# Patient Record
Sex: Male | Born: 1950
Health system: Southern US, Community
[De-identification: ages and names within clinical notes are randomized; demographics above are authoritative.]

## PROBLEM LIST (undated history)

## (undated) DIAGNOSIS — R471 Dysarthria and anarthria: Secondary | ICD-10-CM

## (undated) DIAGNOSIS — R1310 Dysphagia, unspecified: Secondary | ICD-10-CM

## (undated) DIAGNOSIS — E785 Hyperlipidemia, unspecified: Secondary | ICD-10-CM

## (undated) DIAGNOSIS — E119 Type 2 diabetes mellitus without complications: Secondary | ICD-10-CM

## (undated) DIAGNOSIS — K922 Gastrointestinal hemorrhage, unspecified: Secondary | ICD-10-CM

## (undated) DIAGNOSIS — N289 Disorder of kidney and ureter, unspecified: Secondary | ICD-10-CM

## (undated) DIAGNOSIS — I10 Essential (primary) hypertension: Secondary | ICD-10-CM

## (undated) DIAGNOSIS — I639 Cerebral infarction, unspecified: Secondary | ICD-10-CM

## (undated) HISTORY — DX: Dysarthria and anarthria: R47.1

## (undated) HISTORY — DX: Dysphagia, unspecified: R13.10

## (undated) HISTORY — DX: Gastrointestinal hemorrhage, unspecified: K92.2

## (undated) HISTORY — DX: Disorder of kidney and ureter, unspecified: N28.9

---

## 2001-09-20 ENCOUNTER — Emergency Department (HOSPITAL_COMMUNITY): Admission: EM | Admit: 2001-09-20 | Discharge: 2001-09-20 | Payer: Self-pay | Admitting: Emergency Medicine

## 2001-09-20 ENCOUNTER — Encounter: Payer: Self-pay | Admitting: Emergency Medicine

## 2008-08-02 ENCOUNTER — Emergency Department (HOSPITAL_COMMUNITY): Admission: EM | Admit: 2008-08-02 | Discharge: 2008-08-02 | Payer: Self-pay | Admitting: *Deleted

## 2008-09-21 ENCOUNTER — Emergency Department (HOSPITAL_COMMUNITY): Admission: EM | Admit: 2008-09-21 | Discharge: 2008-09-21 | Payer: Self-pay | Admitting: Emergency Medicine

## 2008-11-06 ENCOUNTER — Emergency Department (HOSPITAL_COMMUNITY): Admission: EM | Admit: 2008-11-06 | Discharge: 2008-11-06 | Payer: Self-pay | Admitting: Emergency Medicine

## 2010-08-16 LAB — CBC
HCT: 46.7 % (ref 39.0–52.0)
Hemoglobin: 15.5 g/dL (ref 13.0–17.0)
MCHC: 33.2 g/dL (ref 30.0–36.0)
MCV: 84.2 fL (ref 78.0–100.0)
Platelets: 224 10*3/uL (ref 150–400)
RBC: 5.55 MIL/uL (ref 4.22–5.81)
RDW: 14.1 % (ref 11.5–15.5)
WBC: 10.2 10*3/uL (ref 4.0–10.5)

## 2010-08-16 LAB — BASIC METABOLIC PANEL
BUN: 11 mg/dL (ref 6–23)
CO2: 26 mEq/L (ref 19–32)
Calcium: 9.3 mg/dL (ref 8.4–10.5)
Chloride: 106 mEq/L (ref 96–112)
Creatinine, Ser: 1.1 mg/dL (ref 0.4–1.5)
GFR calc Af Amer: >60 mL/min (ref >60)
GFR calc non Af Amer: >60 mL/min (ref >60)
Glucose, Bld: 113 mg/dL — ABNORMAL HIGH (ref 70–99)
Potassium: 3.9 mEq/L (ref 3.5–5.1)
Sodium: 140 mEq/L (ref 135–145)

## 2010-08-16 LAB — DIFFERENTIAL
Basophils Absolute: 0 10*3/uL (ref 0.0–0.1)
Basophils Relative: 0 % (ref 0–1)
Eosinophils Absolute: 0.2 10*3/uL (ref 0.0–0.7)
Eosinophils Relative: 2 % (ref 0–5)
Lymphocytes Relative: 19 % (ref 12–46)
Lymphs Abs: 2 10*3/uL (ref 0.7–4.0)
Monocytes Absolute: 0.9 10*3/uL (ref 0.1–1.0)
Monocytes Relative: 9 % (ref 3–12)
Neutro Abs: 7.2 10*3/uL (ref 1.7–7.7)
Neutrophils Relative %: 70 % (ref 43–77)

## 2010-08-16 LAB — URINALYSIS, ROUTINE W REFLEX MICROSCOPIC
Glucose, UA: NEGATIVE mg/dL
Hgb urine dipstick: NEGATIVE
Protein, ur: >300 mg/dL — AB
Urobilinogen, UA: 1 mg/dL (ref 0.0–1.0)

## 2010-08-16 LAB — SYNOVIAL CELL COUNT + DIFF, W/ CRYSTALS
Monocyte-Macrophage-Synovial Fluid: 7 % — ABNORMAL LOW (ref 50–90)
WBC, Synovial: 33240 /mm3 — ABNORMAL HIGH (ref 0–200)

## 2010-08-16 LAB — SEDIMENTATION RATE: Sed Rate: 17 mm/hr — ABNORMAL HIGH (ref 0–16)

## 2010-08-16 LAB — URINE MICROSCOPIC-ADD ON

## 2010-08-16 LAB — GRAM STAIN

## 2010-08-16 LAB — BODY FLUID CULTURE: Culture: NO GROWTH

## 2010-08-18 LAB — ACETAMINOPHEN LEVEL: Acetaminophen (Tylenol), Serum: <10 ug/mL — ABNORMAL LOW (ref 10–30)

## 2010-08-18 LAB — DIFFERENTIAL
Basophils Relative: 1 % (ref 0–1)
Lymphocytes Relative: 17 % (ref 12–46)
Lymphs Abs: 1.9 10*3/uL (ref 0.7–4.0)
Monocytes Absolute: 1.3 10*3/uL — ABNORMAL HIGH (ref 0.1–1.0)
Monocytes Relative: 12 % (ref 3–12)
Neutro Abs: 7.3 10*3/uL (ref 1.7–7.7)
Neutrophils Relative %: 68 % (ref 43–77)

## 2010-08-18 LAB — CBC
Hemoglobin: 15.7 g/dL (ref 13.0–17.0)
MCHC: 33.4 g/dL (ref 30.0–36.0)
RBC: 5.62 MIL/uL (ref 4.22–5.81)
WBC: 10.8 10*3/uL — ABNORMAL HIGH (ref 4.0–10.5)

## 2010-08-18 LAB — RAPID URINE DRUG SCREEN, HOSP PERFORMED
Barbiturates: NOT DETECTED
Cocaine: NOT DETECTED

## 2010-08-18 LAB — BODY FLUID CULTURE: Culture: NO GROWTH

## 2010-08-18 LAB — SALICYLATE LEVEL: Salicylate Lvl: <4 mg/dL (ref 2.8–20.0)

## 2010-08-18 LAB — GRAM STAIN

## 2010-08-18 LAB — BASIC METABOLIC PANEL
BUN: 10 mg/dL (ref 6–23)
CO2: 25 mEq/L (ref 19–32)
Glucose, Bld: 118 mg/dL — ABNORMAL HIGH (ref 70–99)
Potassium: 3.9 mEq/L (ref 3.5–5.1)
Sodium: 135 mEq/L (ref 135–145)

## 2010-08-18 LAB — PROTEIN, BODY FLUID

## 2010-08-18 LAB — SYNOVIAL CELL COUNT + DIFF, W/ CRYSTALS
Lymphocytes-Synovial Fld: 0 % (ref 0–20)
WBC, Synovial: 24900 /mm3 — ABNORMAL HIGH (ref 0–200)

## 2010-08-18 LAB — URIC ACID: Uric Acid, Serum: 10.9 mg/dL — ABNORMAL HIGH (ref 4.0–7.8)

## 2010-08-18 LAB — GLUCOSE, SEROUS FLUID: Glucose, Fluid: 92 mg/dL

## 2010-08-18 LAB — ETHANOL: Alcohol, Ethyl (B): <5 mg/dL (ref 0–10)

## 2011-11-16 ENCOUNTER — Inpatient Hospital Stay (HOSPITAL_COMMUNITY): Payer: Non-veteran care

## 2011-11-16 ENCOUNTER — Encounter (HOSPITAL_COMMUNITY): Payer: Self-pay | Admitting: Emergency Medicine

## 2011-11-16 ENCOUNTER — Emergency Department (HOSPITAL_COMMUNITY): Payer: Non-veteran care

## 2011-11-16 ENCOUNTER — Inpatient Hospital Stay (HOSPITAL_COMMUNITY)
Admission: EM | Admit: 2011-11-16 | Discharge: 2011-11-18 | DRG: 066 | Disposition: A | Discharge status: Home / Self Care | Payer: Non-veteran care | Source: Ambulatory Visit | Attending: Internal Medicine | Admitting: Internal Medicine

## 2011-11-16 DIAGNOSIS — I10 Essential (primary) hypertension: Secondary | ICD-10-CM | POA: Diagnosis present

## 2011-11-16 DIAGNOSIS — E785 Hyperlipidemia, unspecified: Secondary | ICD-10-CM | POA: Diagnosis present

## 2011-11-16 DIAGNOSIS — F17200 Nicotine dependence, unspecified, uncomplicated: Secondary | ICD-10-CM | POA: Diagnosis present

## 2011-11-16 DIAGNOSIS — Z720 Tobacco use: Secondary | ICD-10-CM

## 2011-11-16 DIAGNOSIS — E119 Type 2 diabetes mellitus without complications: Secondary | ICD-10-CM | POA: Diagnosis present

## 2011-11-16 DIAGNOSIS — I2510 Atherosclerotic heart disease of native coronary artery without angina pectoris: Secondary | ICD-10-CM

## 2011-11-16 DIAGNOSIS — I639 Cerebral infarction, unspecified: Secondary | ICD-10-CM

## 2011-11-16 DIAGNOSIS — R4789 Other speech disturbances: Secondary | ICD-10-CM | POA: Diagnosis present

## 2011-11-16 DIAGNOSIS — Z823 Family history of stroke: Secondary | ICD-10-CM

## 2011-11-16 DIAGNOSIS — M109 Gout, unspecified: Secondary | ICD-10-CM

## 2011-11-16 DIAGNOSIS — R4701 Aphasia: Secondary | ICD-10-CM | POA: Diagnosis present

## 2011-11-16 DIAGNOSIS — E1159 Type 2 diabetes mellitus with other circulatory complications: Secondary | ICD-10-CM

## 2011-11-16 DIAGNOSIS — I69328 Other speech and language deficits following cerebral infarction: Secondary | ICD-10-CM

## 2011-11-16 DIAGNOSIS — I6350 Cerebral infarction due to unspecified occlusion or stenosis of unspecified cerebral artery: Principal | ICD-10-CM

## 2011-11-16 DIAGNOSIS — Z8673 Personal history of transient ischemic attack (TIA), and cerebral infarction without residual deficits: Secondary | ICD-10-CM

## 2011-11-16 DIAGNOSIS — R7303 Prediabetes: Secondary | ICD-10-CM

## 2011-11-16 HISTORY — DX: Cerebral infarction, unspecified: I63.9

## 2011-11-16 HISTORY — DX: Hyperlipidemia, unspecified: E78.5

## 2011-11-16 HISTORY — DX: Essential (primary) hypertension: I10

## 2011-11-16 LAB — DIFFERENTIAL
Lymphs Abs: 3.3 10*3/uL (ref 0.7–4.0)
Monocytes Relative: 10 % (ref 3–12)
Neutro Abs: 3 10*3/uL (ref 1.7–7.7)
Neutrophils Relative %: 40 % — ABNORMAL LOW (ref 43–77)

## 2011-11-16 LAB — GLUCOSE, CAPILLARY
Glucose-Capillary: 95 mg/dL (ref 70–99)
Glucose-Capillary: 156 mg/dL — ABNORMAL HIGH (ref 70–99)

## 2011-11-16 LAB — CBC
Hemoglobin: 14.9 g/dL (ref 13.0–17.0)
MCH: 28.3 pg (ref 26.0–34.0)
RBC: 5.26 MIL/uL (ref 4.22–5.81)

## 2011-11-16 LAB — COMPREHENSIVE METABOLIC PANEL
Alkaline Phosphatase: 70 U/L (ref 39–117)
BUN: 12 mg/dL (ref 6–23)
Chloride: 105 mEq/L (ref 96–112)
GFR calc Af Amer: 82 mL/min — ABNORMAL LOW (ref >90)
Glucose, Bld: 96 mg/dL (ref 70–99)
Potassium: 3.6 mEq/L (ref 3.5–5.1)
Total Bilirubin: 0.3 mg/dL (ref 0.3–1.2)

## 2011-11-16 LAB — RAPID URINE DRUG SCREEN, HOSP PERFORMED
Amphetamines: NOT DETECTED
Opiates: NOT DETECTED
Tetrahydrocannabinol: NOT DETECTED

## 2011-11-16 LAB — APTT: aPTT: 28 seconds (ref 24–37)

## 2011-11-16 LAB — PROTIME-INR: INR: 1.04 (ref 0.00–1.49)

## 2011-11-16 LAB — ETHANOL: Alcohol, Ethyl (B): <11 mg/dL (ref 0–11)

## 2011-11-16 LAB — CK TOTAL AND CKMB (NOT AT ARMC): Total CK: 202 U/L (ref 7–232)

## 2011-11-16 MED ORDER — ENOXAPARIN SODIUM 40 MG/0.4ML ~~LOC~~ SOLN
40.0000 mg | SUBCUTANEOUS | Status: DC
  Administered 2011-11-16 – 2011-11-17 (×2): 40 mg via SUBCUTANEOUS
  Filled 2011-11-16 (×3): qty 0.4

## 2011-11-16 MED ORDER — SODIUM CHLORIDE 0.9 % IV SOLN: 250.0000 mL | INTRAVENOUS | Status: DC | PRN

## 2011-11-16 MED ORDER — ASPIRIN 325 MG PO TABS
325.0000 mg | ORAL_TABLET | Freq: Every day | ORAL | Status: DC
  Administered 2011-11-17 – 2011-11-18 (×2): 325 mg via ORAL
  Filled 2011-11-16 (×2): qty 1

## 2011-11-16 MED ORDER — ASPIRIN 81 MG PO CHEW
324.0000 mg | CHEWABLE_TABLET | Freq: Once | ORAL | Status: AC
  Administered 2011-11-16: 324 mg via ORAL
  Filled 2011-11-16: qty 4

## 2011-11-16 MED ORDER — ALLOPURINOL 300 MG PO TABS
300.0000 mg | ORAL_TABLET | Freq: Every day | ORAL | Status: DC
  Administered 2011-11-17 – 2011-11-18 (×2): 300 mg via ORAL
  Filled 2011-11-16 (×2): qty 1

## 2011-11-16 MED ORDER — SODIUM CHLORIDE 0.9 % IJ SOLN: 3.0000 mL | INTRAMUSCULAR | Status: DC | PRN

## 2011-11-16 MED ORDER — ASPIRIN 300 MG RE SUPP
300.0000 mg | Freq: Every day | RECTAL | Status: DC
  Filled 2011-11-16 (×2): qty 1

## 2011-11-16 MED ORDER — SIMVASTATIN 20 MG PO TABS
20.0000 mg | ORAL_TABLET | Freq: Every day | ORAL | Status: DC
  Administered 2011-11-17 – 2011-11-18 (×2): 20 mg via ORAL
  Filled 2011-11-16 (×2): qty 1

## 2011-11-16 MED ORDER — SODIUM CHLORIDE 0.9 % IJ SOLN
3.0000 mL | Freq: Two times a day (BID) | INTRAMUSCULAR | Status: DC
  Administered 2011-11-16 – 2011-11-18 (×4): 3 mL via INTRAVENOUS

## 2011-11-16 MED ORDER — SENNOSIDES-DOCUSATE SODIUM 8.6-50 MG PO TABS: 1.0000 | ORAL_TABLET | Freq: Every evening | ORAL | Status: DC | PRN

## 2011-11-16 NOTE — ED Notes (Signed)
Pt. Lives at [facility] in [place].

## 2011-11-16 NOTE — ED Provider Notes (Signed)
History     CSN: [place]:[id number]  Arrival date & time 11/16/11  2   First MD Initiated Contact with Patient 11/16/11 1618      No chief complaint on file.   (Consider location/radiation/quality/duration/timing/severity/associated sxs/prior treatment) The history is provided by the patient and the EMS personnel.   61 year old male noted that he was having difficulty speaking at about 9 AM today. He was last able to speak normally yesterday. He did not have any weakness and denies any headache or chest pain. Symptoms are moderate and getting worse. He denies chest pain, heaviness, tightness, pressure. Denies nausea, vomiting, fever, chills, sweats. He had a previous stroke with similar symptoms.  Past Medical History  Diagnosis Date  . Hypertension   . Hyperlipidemia   . Stroke     History reviewed. No pertinent past surgical history.  No family history on file.  History  Substance Use Topics  . Smoking status: Current Everyday Smoker -- 0.5 packs/day    Types: Cigarettes  . Smokeless tobacco: Not on file  . Alcohol Use: No      Review of Systems  All other systems reviewed and are negative.    Allergies  Review of patient's allergies indicates no known allergies.  Home Medications  No current outpatient prescriptions on file.  There were no vitals taken for this visit.  Physical Exam  Nursing note and vitals reviewed.  61 year old male who is resting comfortably and in no acute distress. Vital signs are significant for mild hypertension with blood pressure 164/94, mild bradycardia with heart rate of 56. Oxygen saturation is 98% which is normal. Head is normocephalic and atraumatic. PERRLA, EOMI. Fundi show no hemorrhage, exudate, or papilledema. There is mild right facial droop present. Neck is nontender and supple without adenopathy, JVD, or bruit. Back is nontender. Lungs are clear without rales, wheezes, rhonchi. Heart has regular rate rhythm without murmur.  Abdomen is soft, flat, nontender without masses or hepatosplenomegaly. Extremities have no cyanosis or edema, full range of motion is present. Skin is warm and dry without rash. Neurologic: Speech is fluent but mildly dysarthric. Cranial nerves are significant for mild right central facial droop. He has slightly weaker grip strength on the right compared with the left which he blames on arthritis. He is right-handed. There is very slight right pronator drift. Objective testing of muscles in his arms and legs she'll also muscles are 5/5.  ED Course  Procedures (including critical care time)  Results for orders placed during the hospital encounter of 11/16/11  PROTIME-INR      Component Value Range   Prothrombin Time 13.8  11.6 - 15.2 seconds   INR 1.04  0.00 - 1.49  APTT      Component Value Range   aPTT 28  24 - 37 seconds  CBC      Component Value Range   WBC 7.7  4.0 - 10.5 K/uL   RBC 5.26  4.22 - 5.81 MIL/uL   Hemoglobin 14.9  13.0 - 17.0 g/dL   HCT 42.9  39.0 - 52.0 %   MCV 81.6  78.0 - 100.0 fL   MCH 28.3  26.0 - 34.0 pg   MCHC 34.7  30.0 - 36.0 g/dL   RDW 13.6  11.5 - 15.5 %   Platelets 212  150 - 400 K/uL  DIFFERENTIAL      Component Value Range   Neutrophils Relative 40 (*) 43 - 77 %   Neutro Abs 3.0  1.7 -  7.7 K/uL   Lymphocytes Relative 43  12 - 46 %   Lymphs Abs 3.3  0.7 - 4.0 K/uL   Monocytes Relative 10  3 - 12 %   Monocytes Absolute 0.8  0.1 - 1.0 K/uL   Eosinophils Relative 6 (*) 0 - 5 %   Eosinophils Absolute 0.5  0.0 - 0.7 K/uL   Basophils Relative 1  0 - 1 %   Basophils Absolute 0.0  0.0 - 0.1 K/uL  COMPREHENSIVE METABOLIC PANEL      Component Value Range   Sodium 138  135 - 145 mEq/L   Potassium 3.6  3.5 - 5.1 mEq/L   Chloride 105  96 - 112 mEq/L   CO2 21  19 - 32 mEq/L   Glucose, Bld 96  70 - 99 mg/dL   BUN 12  6 - 23 mg/dL   Creatinine, Ser 1.10  0.50 - 1.35 mg/dL   Calcium 9.2  8.4 - 10.5 mg/dL   Total Protein 7.7  6.0 - 8.3 g/dL   Albumin 3.4 (*)  3.5 - 5.2 g/dL   AST 18  0 - 37 U/L   ALT 14  0 - 53 U/L   Alkaline Phosphatase 70  39 - 117 U/L   Total Bilirubin 0.3  0.3 - 1.2 mg/dL   GFR calc non Af Amer 71 (*) >90 mL/min   GFR calc Af Amer 82 (*) >90 mL/min  CK TOTAL AND CKMB      Component Value Range   Total CK 202  7 - 232 U/L   CK, MB 2.8  0.3 - 4.0 ng/mL   Relative Index 1.4  0.0 - 2.5  TROPONIN I      Component Value Range   Troponin I <0.30  <0.30 ng/mL  URINE RAPID DRUG SCREEN (HOSP PERFORMED)      Component Value Range   Opiates NONE DETECTED  NONE DETECTED   Cocaine NONE DETECTED  NONE DETECTED   Benzodiazepines NONE DETECTED  NONE DETECTED   Amphetamines NONE DETECTED  NONE DETECTED   Tetrahydrocannabinol NONE DETECTED  NONE DETECTED   Barbiturates NONE DETECTED  NONE DETECTED  ETHANOL      Component Value Range   Alcohol, Ethyl (B) <11  0 - 11 mg/dL  GLUCOSE, CAPILLARY      Component Value Range   Glucose-Capillary 95  70 - 99 mg/dL   Ct Head Wo Contrast  11/16/2011  *RADIOLOGY REPORT*  Clinical Data: 61 year old male with slurred speech.  History of stroke.  CT HEAD WITHOUT CONTRAST  Technique:  Contiguous axial images were obtained from the base of the skull through the vertex without contrast.  Comparison: None.  Findings: Left ethmoid sinus opacification.  Other Visualized paranasal sinuses and mastoids are clear.  Orbit and scalp soft tissues are within normal limits. No acute osseous abnormality identified.  Calcified atherosclerosis at the skull base.  Small chronic anterior right frontal lobe cortical infarct with subjacent white matter gliosis.  Patchy bilateral cerebral white matter hypodensity elsewhere.  No ventriculomegaly. No midline shift, mass effect, or evidence of mass lesion.  No acute intracranial hemorrhage identified.  No changes of acute cortically based infarct are identified in the brain parenchyma, however, there is conspicuous asymmetric hyperdensity of a left posterior MCA division branch  on series 2 image 13.  This could simply be related to intracranial atherosclerosis which appears to be wide spread.  No other suspicious intracranial vascular hyperdensity.  IMPRESSION: 1.  No  brain parenchyma changes of acute cortically based infarct. No hemorrhage or mass effect. 2.  Indeterminate hyperdensity of a left MCA posterior M2 branch, could be related to atherosclerosis - or acute clot if there is clinical evidence of acute left MCA infarct. 3.  Chronic small and medium size vessel ischemia.  Original Report Authenticated By: Randall An, M.D.      Date: 11/16/2011  Rate: 55  Rhythm: sinus bradycardia  QRS Axis: left  Intervals: normal  ST/T Wave abnormalities: Deep T-wave inversions in the inferior and anterolateral leads  Conduction Disutrbances:nonspecific intraventricular conduction delay  Narrative Interpretation: Left axis deviation, T wave inversions which may represent ischemia, Q waves in leads V1 and V2 indicating probable old anteroseptal myocardial infarction, borderline left axis deviation. No old ECG available for comparison.  Old EKG Reviewed: none available    1. Stroke       MDM  Left cervical hemisphere stroke with resulting dysarthric speech and mild left facial droop and mild right arm pronator drift. He is not a code stroke candidate because he was last known to be normal approximately 17 hours ago. He has not had a candidate for thrombolytics because of that length of time as well. Stroke workup is initiated.  Case is discussed with resident on call for outpatient clinics who agrees to admit the patient and will come and see the patient in the emergency department.      Delora Fuel, MD [phone]

## 2011-11-16 NOTE — ED Notes (Signed)
Patient transported to MRI 

## 2011-11-16 NOTE — ED Notes (Signed)
Pt. Arrived by ems. Reports that pt.began having slurred speech at 0900 today. Pt. Grips equal. Follows commands. 20 IV left wrist. Pt. A&ox4. Hx of htn.

## 2011-11-16 NOTE — H&P (Signed)
Hospital Admission Note Date: 11/16/2011  Patient name: Sean Collins Medical record number: [id number] Date of birth: [birth year] Age: 61 y.o. Gender: male PCP: DEFAULT,PROVIDER, MD  Medical Service: [facility]  Attending physician:  Dr. Eppie Gibson    1st Contact: Dr. Eulas Post   Pager: [phone] 2nd Contact: Dr. Posey Pronto   Pager: [phone] After 5 pm or weekends: 1st Contact:      Pager: [phone] 2nd Contact:      Pager: [phone]  Chief Complaint: slurring of speech  History of Present Illness: Sean Collins has a hx of past stroke and MI s/p multiple stents last one 7 months ago, HTN, HLP, gout, DM that is diet controlled. Today he said at 9/9:30 am he began slurring his speech and felt that he was having trouble finding the words to speak. These were the exact similar symptoms he had with his previous stroke, which left him with no residual deficits. He states that he didn't experience any muscle weakness, change in ambulation, vision changes, HA, n/v/d, diaphoresis, CP, SOB, or abdominal pain. He reports being in his baseline health before and during and that the only frustrating change was this slurring of speech. The pt said that he felt his symptoms would improve but they didn't and therefore when he finally called EMS he was outside TPA window. He is a current 0.5 ppd smoker did quit for a period of time but has started to smoke again especially when his brother just recently died 2 months ago s/p stroke. He has had to move to the [employer] because of his inability to pay the rent at his brother's place where he was living. He is also unable to pay for his medications regularly and reports stopping his ASA about 4 months ago and his pravastatin he takes inconsistently. His PCP is at [facility] in [place], [place]. He denied any other illicit drugs past or present. He has a strong family hx of early death by strokes. In the ED he was slightly hypertensive 169/84 and bradycardic 52, he was alert and  oriented x3, equal strength, and only slurred speech as a deficit. He didn't feel that his speech had improved during our interview. 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   Meds: Medications Prior to Admission  Medication Sig Dispense Refill  . allopurinol (ZYLOPRIM) 300 MG tablet Take 300 mg by mouth daily.      Marland Kitchen amLODipine (NORVASC) 10 MG tablet Take 5 mg by mouth daily.      Marland Kitchen atenolol (TENORMIN) 50 MG tablet Take 50 mg by mouth 2 (two) times daily.      . cyanocobalamin 500 MCG tablet Take 1,000 mcg by mouth daily.      . enalapril (VASOTEC) 20 MG tablet Take 20 mg by mouth daily.      . pravastatin (PRAVACHOL) 40 MG tablet Take 40 mg by mouth at bedtime.       Allergies: Allergies as of 11/16/2011  . (No Known Allergies)   Past Medical History  Diagnosis Date  . Hypertension   . Hyperlipidemia   . Stroke    History   Social History  . Marital Status: Legally Separated    Spouse Name: N/A    Number of Children: N/A  . Years of Education: N/A   Occupational History  . Not on file.   Social History Main Topics  . Smoking status: Current Everyday Smoker -- 0.5 packs/day for 2 years  Types: Cigarettes  . Smokeless tobacco: Not on file  . Alcohol Use: No  . Drug Use: No  . Sexually Active:    Other Topics Concern  . Not on file   Social History Narrative  . No narrative on file    Review of Systems: Pertinent items are noted in HPI.  Physical Exam: Blood pressure 138/88, pulse 51, temperature 98 F (36.7 C), temperature source Oral, resp. rate 21, SpO2 96.00%. General: resting in bed comfortably  HEENT: PERRL, EOMI, no scleral icterus, no JVD Cardiac:soft, brady, regular rhythm, normal S1/S2, no S3/S4,rubs, murmurs or gallops Pulm: clear to auscultation bilaterally, moving normal volumes of air, no crackles, wheezes Abd: soft, nontender, nondistended, BS present, no organomegaly Ext: warm and well perfused, no pedal  edema Neuro: alert and oriented X3, no tongue deviation, no oropharynx elevation, normal strength 5/5 UE and LE bilaterally, normal sensation UE and LE bilaterally, some slurring of speech, expressive aphasia, no receptive aphasia, no neglect   Lab results: Basic Metabolic Panel:  Basename 11/16/11 1728  NA 138  K 3.6  CL 105  CO2 21  GLUCOSE 96  BUN 12  CREATININE 1.10  CALCIUM 9.2  MG --  PHOS --   Liver Function Tests:  Basename 11/16/11 1728  AST 18  ALT 14  ALKPHOS 70  BILITOT 0.3  PROT 7.7  ALBUMIN 3.4*   CBC:  Basename 11/16/11 1728  WBC 7.7  NEUTROABS 3.0  HGB 14.9  HCT 42.9  MCV 81.6  PLT 212   Cardiac Enzymes:  Basename 11/16/11 1728  CKTOTAL 202  CKMB 2.8  CKMBINDEX --  TROPONINI <0.30   CBG:  Basename 11/16/11 1638  GLUCAP 95   Coagulation:  Basename 11/16/11 1728  LABPROT 13.8  INR 1.04   Urine Drug Screen: Drugs of Abuse     Component Value Date/Time   LABOPIA NONE DETECTED 11/16/2011 1641   COCAINSCRNUR NONE DETECTED 11/16/2011 1641   LABBENZ NONE DETECTED 11/16/2011 1641   AMPHETMU NONE DETECTED 11/16/2011 1641   THCU NONE DETECTED 11/16/2011 1641   LABBARB NONE DETECTED 11/16/2011 1641    Alcohol Level:  Basename 11/16/11 1728  ETH <11    Imaging results:  Ct Head Wo Contrast  11/16/2011  *RADIOLOGY REPORT*  Clinical Data: 61 year old male with slurred speech.  History of stroke.  CT HEAD WITHOUT CONTRAST  Technique:  Contiguous axial images were obtained from the base of the skull through the vertex without contrast.  Comparison: None.  Findings: Left ethmoid sinus opacification.  Other Visualized paranasal sinuses and mastoids are clear.  Orbit and scalp soft tissues are within normal limits. No acute osseous abnormality identified.  Calcified atherosclerosis at the skull base.  Small chronic anterior right frontal lobe cortical infarct with subjacent white matter gliosis.  Patchy bilateral cerebral white matter hypodensity  elsewhere.  No ventriculomegaly. No midline shift, mass effect, or evidence of mass lesion.  No acute intracranial hemorrhage identified.  No changes of acute cortically based infarct are identified in the brain parenchyma, however, there is conspicuous asymmetric hyperdensity of a left posterior MCA division branch on series 2 image 13.  This could simply be related to intracranial atherosclerosis which appears to be wide spread.  No other suspicious intracranial vascular hyperdensity.  IMPRESSION: 1.  No brain parenchyma changes of acute cortically based infarct. No hemorrhage or mass effect. 2.  Indeterminate hyperdensity of a left MCA posterior M2 branch, could be related to atherosclerosis - or acute clot if there  is clinical evidence of acute left MCA infarct. 3.  Chronic small and medium size vessel ischemia.  Original Report Authenticated By: Randall An, M.D.   Mr Jodene Nam Head Wo Contrast  11/16/2011  *RADIOLOGY REPORT*  Clinical Data:  Abnormal speech.  Rule out stroke  MRI HEAD WITHOUT CONTRAST MRA HEAD WITHOUT CONTRAST  Technique:  Multiplanar, multiecho pulse sequences of the brain and surrounding structures were obtained without intravenous contrast. Angiographic images of the head were obtained using MRA technique without contrast.  Comparison:  CT 11/16/2011  MRI HEAD  Findings:  Acute infarct in the deep white matter of the left cerebral hemisphere.  This measures approximately 12 mm in diameter.  Chronic microvascular ischemic changes are present in the white matter.  Chronic hemorrhagic infarct in the high left frontal lobe. The brainstem is intact.  Negative for mass lesion.  Chronic sinusitis with mucosal thickening in the paranasal sinuses.  IMPRESSION: Acute infarct in the deep periventricular white matter on the left.  Moderate chronic microvascular ischemic changes.  MRA HEAD  Findings: There is intracranial atherosclerotic disease.  No large vessel occlusion.  There is irregularity of the  distal vertebral arteries bilaterally.  There is atherosclerotic disease in the basilar artery with mild to moderate stenosis.  There is atherosclerotic irregularity in the posterior cerebral arteries bilaterally with moderate to severe stenoses bilaterally.  Internal carotid artery is patent bilaterally with atherosclerotic irregularity in the cavernous segment.  Diffuse atherosclerotic irregularity in anterior and middle cerebral arteries without high- grade stenosis.  Negative for aneurysm.  IMPRESSION: Diffuse intracranial atherosclerotic disease.  There is a atherosclerotic disease throughout the basilar artery with moderate to severe stenoses in the posterior cerebral artery bilaterally.  Original Report Authenticated By: Truett Perna, M.D.   Mr Brain Wo Contrast  11/16/2011  *RADIOLOGY REPORT*  Clinical Data:  Abnormal speech.  Rule out stroke  MRI HEAD WITHOUT CONTRAST MRA HEAD WITHOUT CONTRAST  Technique:  Multiplanar, multiecho pulse sequences of the brain and surrounding structures were obtained without intravenous contrast. Angiographic images of the head were obtained using MRA technique without contrast.  Comparison:  CT 11/16/2011  MRI HEAD  Findings:  Acute infarct in the deep white matter of the left cerebral hemisphere.  This measures approximately 12 mm in diameter.  Chronic microvascular ischemic changes are present in the white matter.  Chronic hemorrhagic infarct in the high left frontal lobe. The brainstem is intact.  Negative for mass lesion.  Chronic sinusitis with mucosal thickening in the paranasal sinuses.  IMPRESSION: Acute infarct in the deep periventricular white matter on the left.  Moderate chronic microvascular ischemic changes.  MRA HEAD  Findings: There is intracranial atherosclerotic disease.  No large vessel occlusion.  There is irregularity of the distal vertebral arteries bilaterally.  There is atherosclerotic disease in the basilar artery with mild to moderate stenosis.   There is atherosclerotic irregularity in the posterior cerebral arteries bilaterally with moderate to severe stenoses bilaterally.  Internal carotid artery is patent bilaterally with atherosclerotic irregularity in the cavernous segment.  Diffuse atherosclerotic irregularity in anterior and middle cerebral arteries without high- grade stenosis.  Negative for aneurysm.  IMPRESSION: Diffuse intracranial atherosclerotic disease.  There is a atherosclerotic disease throughout the basilar artery with moderate to severe stenoses in the posterior cerebral artery bilaterally.  Original Report Authenticated By: Truett Perna, M.D.    Other results: EKG: there are no previous tracings available for comparison, sinus bradycardia, ST depression in most leads with reciprocal  elevation, left axis deviation.  Assessment & Plan by Problem: 1. Stroke- Pt MRI showed acute infarct in the deep white matter of L cerebral hemisphere about 27mm in diameter and chronic hemorrhagic infarct in the high left frontal lobe. MRA diffuse intracranial atherosclerotic disease and mod-severe stenoses in PCA bilaterally. Pt was outside TPA window on presentation. Given ASA in EMS. First CE negative.  -ASA -ECHO and Carotid dopplers pending -Cmet, CBC pending in AM -pulse ox and oxygen nasal cannula -smoking cessation counseling -SLP eval and treatment  -tele -neuro checks  2. HLD- pt reports periods of inability to afford medications especially 2/2 recent Brother's death.  -simvastatin 20mg  -FLP pending  3. HTN- Pt brady episodes in 1s, and slightly hypertensive 160s/90s -held home BP meds  4. Gout- pt doesn't seem to be in active flare, last uric acid 3/10 was 10.9 -continue allopurinol 300mg   -uric acid pending  5. DM- pt is diet controlled, HgbA1c pending  6.DVT pptx: lovenonx 40mg   Signed: Clinton Gallant 11/16/2011, 10:39 PM

## 2011-11-17 DIAGNOSIS — I69328 Other speech and language deficits following cerebral infarction: Secondary | ICD-10-CM

## 2011-11-17 DIAGNOSIS — I369 Nonrheumatic tricuspid valve disorder, unspecified: Secondary | ICD-10-CM

## 2011-11-17 LAB — GLUCOSE, CAPILLARY
Glucose-Capillary: 141 mg/dL — ABNORMAL HIGH (ref 70–99)
Glucose-Capillary: 131 mg/dL — ABNORMAL HIGH (ref 70–99)
Glucose-Capillary: 136 mg/dL — ABNORMAL HIGH (ref 70–99)
Glucose-Capillary: 108 mg/dL — ABNORMAL HIGH (ref 70–99)
Glucose-Capillary: 112 mg/dL — ABNORMAL HIGH (ref 70–99)

## 2011-11-17 LAB — CBC
MCH: 27.8 pg (ref 26.0–34.0)
MCHC: 34.1 g/dL (ref 30.0–36.0)
RDW: 13.8 % (ref 11.5–15.5)

## 2011-11-17 LAB — CARDIAC PANEL(CRET KIN+CKTOT+MB+TROPI)
CK, MB: 2.7 ng/mL (ref 0.3–4.0)
Relative Index: 1.5 (ref 0.0–2.5)
Total CK: 181 U/L (ref 7–232)
Total CK: 169 U/L (ref 7–232)

## 2011-11-17 LAB — COMPREHENSIVE METABOLIC PANEL
ALT: 13 U/L (ref 0–53)
Alkaline Phosphatase: 66 U/L (ref 39–117)
CO2: 23 mEq/L (ref 19–32)
Chloride: 105 mEq/L (ref 96–112)
GFR calc Af Amer: 79 mL/min — ABNORMAL LOW (ref >90)
GFR calc non Af Amer: 68 mL/min — ABNORMAL LOW (ref >90)
Glucose, Bld: 121 mg/dL — ABNORMAL HIGH (ref 70–99)
Potassium: 3.6 mEq/L (ref 3.5–5.1)
Sodium: 140 mEq/L (ref 135–145)

## 2011-11-17 LAB — URIC ACID: Uric Acid, Serum: 12.2 mg/dL — ABNORMAL HIGH (ref 4.0–7.8)

## 2011-11-17 LAB — HEMOGLOBIN A1C
Hgb A1c MFr Bld: 6.8 % — ABNORMAL HIGH (ref <5.7)
Mean Plasma Glucose: 148 mg/dL — ABNORMAL HIGH (ref <117)

## 2011-11-17 NOTE — Progress Notes (Signed)
Medical Student Daily Progress Note  Subjective: Sean Collins is a 61 y/o male with a PMH of stroke, MI (s/p multiple stents last one 7 months ago), HTN, HLP, gout, and DM that is diet controlled who presented to the ED last night and subsequently diagnosed with ischemic stroke.  There were no acute events overnight, and pt states that the only thing that bothers him is his speech.  He expressed concern about his ability to afford his medications, future treatment and this hospital stay.  When asked how much he could afford to pay for monthly meds, he stated he could not pay anything and be able to afford rent because he only works part-time.  He also stated that if he has to stay in the hospital for a few more days, he would like to be transferred to the [facility] hospital; this request is based on his belief that his treatment (for this stay) there would be significantly cheaper.    Objective: Vital signs in last 24 hours: Filed Vitals:   11/17/11 0157 11/17/11 0400 11/17/11 0600 11/17/11 1033  BP: 166/76 170/73 159/80 155/82  Pulse: 56 52 57 62  Temp: 97.6 F (36.4 C) 97.6 F (36.4 C) 97.6 F (36.4 C) 97.2 F (36.2 C)  TempSrc: Oral Oral Oral Oral  Resp: 20 20 20 18   Height:      Weight:      SpO2: 99% 98% 98% 99%   Weight change:   Intake/Output Summary (Last 24 hours) at 11/17/11 1600 Last data filed at 11/17/11 0800  Gross per 24 hour  Intake      3 ml  Output      1 ml  Net      2 ml   Physical Exam: General appearance: alert and cooperative Lungs: normal percussion bilaterally Heart: regular rate and rhythm, S1, S2 normal, no murmur, click, rub or gallop Pulses: 2+ and symmetric Neurologic: Pt has problems finding his words, no problems with comprehension.  Oriented x3  Lab Results: Basic Metabolic Panel:  Lab [id number] 11/16/11 1728  NA 140 138  K 3.6 3.6  CL 105 105  CO2 23 21  GLUCOSE 121* 96  BUN 14 12  CREATININE 1.13 1.10  CALCIUM 9.4 9.2  MG -- --  PHOS --  --   Liver Function Tests:  Lab 11/17/11 0537 11/16/11 1728  AST 16 18  ALT 13 14  ALKPHOS 66 70  BILITOT 0.5 0.3  PROT 7.3 7.7  ALBUMIN 3.2* 3.4*   CBC:  Lab 11/17/11 0537 11/16/11 1728  WBC 7.5 7.7  NEUTROABS -- 3.0  HGB 15.1 14.9  HCT 44.3 42.9  MCV 81.4 81.6  PLT 203 212   Cardiac Enzymes:  Lab 11/17/11 1505 11/17/11 0854 11/16/11 1728  CKTOTAL 169 181 202  CKMB 2.5 2.7 2.8  CKMBINDEX -- -- --  TROPONINI <0.30 <0.30 <0.30   CBG:  Lab 11/17/11 1053 11/17/11 0648 11/16/11 2224 11/16/11 1638  GLUCAP 131* 141* 156* 95   Hemoglobin A1C:  Lab 11/17/11 0537  HGBA1C 6.8*   Fasting Lipid Panel:  Lab 11/17/11 0537  CHOL 218*  HDL 35*  LDLCALC 146*  TRIG 186*  CHOLHDL 6.2  LDLDIRECT --   Coagulation:  Lab 11/16/11 1728  LABPROT 13.8  INR 1.04   Urine Drug Screen: Drugs of Abuse     Component Value Date/Time   LABOPIA NONE DETECTED 11/16/2011 1641   COCAINSCRNUR NONE DETECTED 11/16/2011 1641   LABBENZ NONE DETECTED  11/16/2011 1641   AMPHETMU NONE DETECTED 11/16/2011 1641   THCU NONE DETECTED 11/16/2011 1641   LABBARB NONE DETECTED 11/16/2011 1641    Alcohol Level:  Lab 11/16/11 1728  ETH <11   Studies/Results: Ct Head Wo Contrast  11/16/2011  *RADIOLOGY REPORT*  Clinical Data: 61 year old male with slurred speech.  History of stroke.  CT HEAD WITHOUT CONTRAST  Technique:  Contiguous axial images were obtained from the base of the skull through the vertex without contrast.  Comparison: None.  Findings: Left ethmoid sinus opacification.  Other Visualized paranasal sinuses and mastoids are clear.  Orbit and scalp soft tissues are within normal limits. No acute osseous abnormality identified.  Calcified atherosclerosis at the skull base.  Small chronic anterior right frontal lobe cortical infarct with subjacent white matter gliosis.  Patchy bilateral cerebral white matter hypodensity elsewhere.  No ventriculomegaly. No midline shift, mass effect, or evidence of  mass lesion.  No acute intracranial hemorrhage identified.  No changes of acute cortically based infarct are identified in the brain parenchyma, however, there is conspicuous asymmetric hyperdensity of a left posterior MCA division branch on series 2 image 13.  This could simply be related to intracranial atherosclerosis which appears to be wide spread.  No other suspicious intracranial vascular hyperdensity.  IMPRESSION: 1.  No brain parenchyma changes of acute cortically based infarct. No hemorrhage or mass effect. 2.  Indeterminate hyperdensity of a left MCA posterior M2 branch, could be related to atherosclerosis - or acute clot if there is clinical evidence of acute left MCA infarct. 3.  Chronic small and medium size vessel ischemia.  Original Report Authenticated By: Randall An, M.D.   Mr Jodene Nam Head Wo Contrast  11/16/2011  *RADIOLOGY REPORT*  Clinical Data:  Abnormal speech.  Rule out stroke  MRI HEAD WITHOUT CONTRAST MRA HEAD WITHOUT CONTRAST  Technique:  Multiplanar, multiecho pulse sequences of the brain and surrounding structures were obtained without intravenous contrast. Angiographic images of the head were obtained using MRA technique without contrast.  Comparison:  CT 11/16/2011  MRI HEAD  Findings:  Acute infarct in the deep white matter of the left cerebral hemisphere.  This measures approximately 12 mm in diameter.  Chronic microvascular ischemic changes are present in the white matter.  Chronic hemorrhagic infarct in the high left frontal lobe. The brainstem is intact.  Negative for mass lesion.  Chronic sinusitis with mucosal thickening in the paranasal sinuses.  IMPRESSION: Acute infarct in the deep periventricular white matter on the left.  Moderate chronic microvascular ischemic changes.  MRA HEAD  Findings: There is intracranial atherosclerotic disease.  No large vessel occlusion.  There is irregularity of the distal vertebral arteries bilaterally.  There is atherosclerotic disease in  the basilar artery with mild to moderate stenosis.  There is atherosclerotic irregularity in the posterior cerebral arteries bilaterally with moderate to severe stenoses bilaterally.  Internal carotid artery is patent bilaterally with atherosclerotic irregularity in the cavernous segment.  Diffuse atherosclerotic irregularity in anterior and middle cerebral arteries without high- grade stenosis.  Negative for aneurysm.  IMPRESSION: Diffuse intracranial atherosclerotic disease.  There is a atherosclerotic disease throughout the basilar artery with moderate to severe stenoses in the posterior cerebral artery bilaterally.  Original Report Authenticated By: Truett Perna, M.D.   Mr Brain Wo Contrast  11/16/2011  *RADIOLOGY REPORT*  Clinical Data:  Abnormal speech.  Rule out stroke  MRI HEAD WITHOUT CONTRAST MRA HEAD WITHOUT CONTRAST  Technique:  Multiplanar, multiecho pulse sequences of the  brain and surrounding structures were obtained without intravenous contrast. Angiographic images of the head were obtained using MRA technique without contrast.  Comparison:  CT 11/16/2011  MRI HEAD  Findings:  Acute infarct in the deep white matter of the left cerebral hemisphere.  This measures approximately 12 mm in diameter.  Chronic microvascular ischemic changes are present in the white matter.  Chronic hemorrhagic infarct in the high left frontal lobe. The brainstem is intact.  Negative for mass lesion.  Chronic sinusitis with mucosal thickening in the paranasal sinuses.  IMPRESSION: Acute infarct in the deep periventricular white matter on the left.  Moderate chronic microvascular ischemic changes.  MRA HEAD  Findings: There is intracranial atherosclerotic disease.  No large vessel occlusion.  There is irregularity of the distal vertebral arteries bilaterally.  There is atherosclerotic disease in the basilar artery with mild to moderate stenosis.  There is atherosclerotic irregularity in the posterior cerebral arteries  bilaterally with moderate to severe stenoses bilaterally.  Internal carotid artery is patent bilaterally with atherosclerotic irregularity in the cavernous segment.  Diffuse atherosclerotic irregularity in anterior and middle cerebral arteries without high- grade stenosis.  Negative for aneurysm.  IMPRESSION: Diffuse intracranial atherosclerotic disease.  There is a atherosclerotic disease throughout the basilar artery with moderate to severe stenoses in the posterior cerebral artery bilaterally.  Original Report Authenticated By: Truett Perna, M.D.   Vascular Ultrasound (preliminary results)  There is no evidence of internal carotid artery stenosis bilaterally. The right external carotid artery has evidence of elevated velocities. Bilateral antegrade vertebral artery flow.   Medications: I have reviewed the patient's current medications. Scheduled Meds:    . allopurinol  300 mg Oral Daily  . aspirin  324 mg Oral Once  . aspirin  300 mg Rectal Daily   Or  . aspirin  325 mg Oral Daily  . enoxaparin (LOVENOX) injection  40 mg Subcutaneous Q24H  . simvastatin  20 mg Oral q1800  . sodium chloride  3 mL Intravenous Q12H   Continuous Infusions:  PRN Meds:.sodium chloride, senna-docusate, sodium chloride Assessment/Plan: Principal Problem:  *Acute ischemic stroke Active Problems:  Hypertension  Tobacco abuse  Hyperlipidemia  Diabetes mellitus  Gout  History of stroke  CAD (coronary artery disease)  Speech or language deficit, post-stroke  1. Stroke:  Pt MRI showed an acute infarct in the deep white matter of L cerebral hemisphere about 1mm in diameter and chronic hemorrhagic infarct in the high left frontal lobe.  Pt continues to have problems with his speech, but no other prominent deficits.  We are awaiting an evaluation from speech therapy.  We also put in an order for patient to be evaluated by PT to determine if there are any of deficiencies that need to be addressed.  A  preliminary read of the carotid doppler indicates that there is no ICA stenosis, but the right external carotid show increased velocities.  This may be due to plaque formation as pt has significant arthrosclerosis on MRA.   -Continue ASA  -ECHO pending  -Cmet, CBC pending in AM  -Discontinue oxygen.   -smoking cessation counseling  -SLP eval and treatment  -Continue telemetry and neuro checks   2. Hyperlipidemia: At home pt has not been taking medication 2/2 cost.  We started him on simvastatin, and his fasting lipid panel reveals elevated cholesterol, triglycerides, LDL and low HDL.    We will need social work/ case management to see if he is eligible for any programs to help with  medication.  His MRA showed signs of significant atherosclerosis and we would like to have his lipids better controlled to decrease his risk of another MI or stroke in the future.  3. Hypertension- Pt's home BP meds were held 2/2 to concerns of bradycardia.  His heart rate today has mainly been in the low 60s.  He remains slightly hypertensive at 155/82, but it is not necessary to restart his home BP meds.  The current guidelines on hypertension treatment are slightly controversial, but state that these medications should be held for at least 24 hours after an event.  We will consider restarting meds tomorrow.  4. Gout- Pt's uric acid is currently 12.2; the goal of therapy is a value <6.  Because of his previously discussed issues with acquiring medication it is very likely that he has not been taking the prescribed dose of allopurinol regularly.  For this reason we will not change his dosage at this time.   -Continue allopurinol 300mg     5. DM- Pt's diabetes is diet controlled, and his HgbA1c is 6.8.  It seems that this is working well for him.  His BG values have been in the 130s-140s today.  We should continue to watch these values.  RN asked if we wanted him on SSI, but I do not feel that this is necessary at this  time.  We can re-evaluate this if needed.  6. Social:  We have spoken to both social work and case management about the patient's concerns with payment and medications on discharge.  They are working to see what the best options for follow-up are for him.  Upon discharge, we will also evaluate his medications to ensure that he is on the most affordable options.  See Case Manager notes for more info. --[facility] --Case manager is making a request for outpatient speech therapy; will need recommendations for what pt need from inpatient speech team. --Surgery consult for punch biopsy of chest lesions.  Seen by derm--this may be ?T-cell lymphoma.    7.DVT pptx: lovenonx 40mg      LOS: 1 day   This is a Careers information officer Note.  The care of the patient was discussed with Dr. Eppie Gibson and the assessment and plan formulated with their assistance.  Please see their attached note for official documentation of the daily encounter.  Gearold Wainer D 11/17/2011, 4:00 PM

## 2011-11-17 NOTE — Care Management Note (Signed)
    Page 1 of 2   11/17/2011     4:55:36 PM   CARE MANAGEMENT NOTE 11/17/2011  Patient:  Sean Collins, Sean Collins   Account Number:  [id number]  Date Initiated:  11/17/2011  Documentation initiated by:  Lars Pinks  Subjective/Objective Assessment:   PT WAS ADMITTED WITH SLURRED SPEECH     Action/Plan:   PROGRESSION OF CARE AND DISCHARGE PLANNING   Anticipated DC Date:  11/19/2011   Anticipated DC Plan:  HOME/SELF CARE  In-house referral  Clinical Social Worker      DC Planning Services  CM consult      Choice offered to / List presented to:             Status of service:  In process, will continue to follow Medicare Important Message given?   (If response is "NO", the following Medicare IM given date fields will be blank) Date Medicare IM given:   Date Additional Medicare IM given:    Discharge Disposition:    Per UR Regulation:  Reviewed for med. necessity/level of care/duration of stay  If discussed at [place] of Stay Meetings, dates discussed:    Comments:  PCP: DR. Ree Edman- [facility]MUDEZ AT [facility] [place] [facility]  11/17/11 Lars Pinks, RN, BSN 1643 PT WAS ADMITTED WITH SLURRED SPEECH.  PTA PT WAS LIVING AT THE [facility].  PT HAS PARTIAL [facility] BENEF[facility] AND HAS BEEN LAST SEEN AT THE [facility].  PT HAS AN [facility] AT [facility] ON JULY 18TH AT [place].  PT ALSO HAS AN APPT ON AUG 23RD FOR A SURG CONSULT FOR A PUNCH BIOPSY PER [facility] FOR ? T CELL LYMPHOMA OF THE RT UPPER CHEST.  I HAVE SENT PAPER WORK TO THE [facility] TO GET A CONSULT FOR SPEECH AS THIS MAYBE NEEDED AT [place] MAYBE OVER THE WEEKEND. IT WAS ALSO NOTED THAT PT MAY NEED ASSISTANCE WITH HIS MEDICATION AT DC.  PT CAN GO TO THE [facility] TO GET HIS SCRIPTS FILLED AT ANY TIME OR WAIT UNTIL MONDAY AND REPORT TO THE [facility] TO GET MEDS FILLED DURING THE WEEKDAY AND NORMAL OFFICE HRS.

## 2011-11-17 NOTE — Progress Notes (Signed)
UR COMPLETED  

## 2011-11-17 NOTE — Progress Notes (Signed)
  Echocardiogram 2D Echocardiogram has been performed.  Philipp Deputy 11/17/2011, 3:35 PM

## 2011-11-17 NOTE — Progress Notes (Signed)
Mr. Romanoski history, labs, and radiography were reviewed with Sean Collins and the patient was personally interviewed and examined by me.  Please see my H&P from today for the specific details of my evaluation, assessment, and plan.

## 2011-11-17 NOTE — Progress Notes (Addendum)
*  PRELIMINARY RESULTS* Vascular Ultrasound Carotid Duplex (Doppler) has been completed. There is no evidence of internal carotid artery stenosis bilaterally. The right external carotid artery has evidence of elevated velocities. Bilateral antegrade vertebral artery flow.  11/17/2011 3:19 PM Maudry Mayhew, RDMS, RDCS

## 2011-11-17 NOTE — H&P (Signed)
Internal Medicine Attending Admission Note Date: 11/17/2011  Patient name: Sean Collins Medical record number: [id number] Date of birth: Apr 18, 1951 Age: 61 y.o. Gender: male  I saw and evaluated the patient. I reviewed the resident's note and I agree with the resident's findings and plan as documented in the resident's note.  Chief Complaint(s): Slurred speech.  History - key components related to admission:  Sean Collins is a 61 year old man with a history of stroke and coronary artery disease with risk factors including hypertension, hyperlipidemia, tobacco abuse, and diet-controlled diabetes.  He was in his usual state of health until the morning of admission when he developed the sudden onset of slurred speech.  He tried to wait it out but it did not get any better so he called EMS. He was brought to the emergency department but his symptoms were outside the TPA window. He admits to taking his statin irregularly and not taking any aspirin for the last 4 years secondary to the inability to afford all of his medications. In fact, he also admits to being inconsistent with his amlodipine, enalapril and atenolol.  In the ED an MRA of the brain demonstrated diffuse intracranial atherosclerotic disease with moderate to severe stenosis in the posterior cerebral artery bilaterally. He was also found to have an acute infarct in the deep periventricular white matter on the left on MRI. He was admitted for further evaluation and care.  Physical Exam - key components related to admission:  Filed Vitals:   11/17/11 0400 11/17/11 0600 11/17/11 1033 11/17/11 1800  BP: 170/73 159/80 155/82 150/83  Pulse: 52 57 62 68  Temp: 97.6 F (36.4 C) 97.6 F (36.4 C) 97.2 F (36.2 C) 97.5 F (36.4 C)  TempSrc: Oral Oral Oral Oral  Resp: 20 20 18 18   Height:      Weight:      SpO2: 98% 98% 99% 98%   General: Well-developed, well-nourished, man lying comfortably in bed in no acute distress. Lungs: Clear to  auscultation bilaterally without wheezes, rhonchi, or rales. Heart: Regular rate and rhythm without murmurs, rubs, or gallops. Abdomen: Soft, nontender, active bowel sounds. Extremities: Without edema. Neuro: Alert and oriented x3. Strength and sensation intact bilaterally. Speech is slurred and garbled.  Lab results:  Basic Metabolic Panel:  Basename 11/17/11 0537 11/16/11 1728  NA 140 138  K 3.6 3.6  CL 105 105  CO2 23 21  GLUCOSE 121* 96  BUN 14 12  CREATININE 1.13 1.10  CALCIUM 9.4 9.2  MG -- --  PHOS -- --   Liver Function Tests:  Basename 11/17/11 0537 11/16/11 1728  AST 16 18  ALT 13 14  ALKPHOS 66 70  BILITOT 0.5 0.3  PROT 7.3 7.7  ALBUMIN 3.2* 3.4*   CBC:  Basename 11/17/11 0537 11/16/11 1728  WBC 7.5 7.7  NEUTROABS -- 3.0  HGB 15.1 14.9  HCT 44.3 42.9  MCV 81.4 81.6  PLT 203 212   Cardiac Enzymes:  Basename 11/17/11 1505 11/17/11 0854 11/16/11 1728  CKTOTAL 169 181 202  CKMB 2.5 2.7 2.8  CKMBINDEX -- -- --  TROPONINI <0.30 <0.30 <0.30   CBG:  Basename 11/17/11 2022 11/17/11 1641 11/17/11 1053 11/17/11 0648 11/16/11 2224 11/16/11 1638  GLUCAP 108* 136* 131* 141* 156* 95   Hemoglobin A1C:  Basename 11/17/11 0537  HGBA1C 6.8*   Fasting Lipid Panel:  Basename 11/17/11 0537  CHOL 218*  HDL 35*  LDLCALC 146*  TRIG 186*  CHOLHDL 6.2  LDLDIRECT --  Coagulation:  Basename 11/16/11 1728  INR 1.04   Urine Drug Screen:  Negative  Alcohol Level:  Basename 11/16/11 1728  ETH <11   Urinalysis:  Specific gravity 1.036, trace ketones, greater than 300 protein.  Other results:  EKG: Normal sinus bradycardia at 55 beats per minute, left axis deviation, no significant Q waves, no LVH, good R wave progression, 1 mm ST elevation in V1, inferolateral T wave inversions in a strain pattern.  Assessment & Plan by Problem:  Sean Collins is a 61 year old man with a history of stroke, coronary artery disease, hyperlipidemia, hypertension,  diabetes, and tobacco abuse who presents with the acute onset of slurred speech. He was found to have an acute CVA consistent with his symptoms. The likely cause of his acute cerebrovascular accident is in adequate control of his risk factors including his blood pressure, cholesterol, and tobacco abuse.  The lack of antiplatelet agents such as aspirin are also likely a culprit as he may have had a plaque associated thrombus.  1) Cerebrovascular accident: We will start aspirin, allow for permissive hypertension in the first 24 hours, restart him back on his blood pressure medications just prior to discharge, followup on the echocardiogram and carotid Dopplers that were obtained, place him on telemetry to look for any arrhythmias, encourage smoking cessation, restart statin therapy, and obtain a speech therapy consult. We have also asked the caseworker to facilitate followup in the [facility] so that he can obtain all of the necessary medications he requires to modify his risk factors and possibly complete speech therapy there since finances are a major issue at this time.  2) Disposition: I anticipate that Sean Collins will be stable for discharge in the morning after seeing speech therapy and getting their recommendations, after starting risk factor modification pharmacologically, and after following up on the results of the carotid Dopplers and echocardiogram.

## 2011-11-18 LAB — GLUCOSE, CAPILLARY: Glucose-Capillary: 109 mg/dL — ABNORMAL HIGH (ref 70–99)

## 2011-11-18 MED ORDER — ALLOPURINOL 300 MG PO TABS: 300.0000 mg | ORAL_TABLET | Freq: Every day | ORAL | Status: DC

## 2011-11-18 MED ORDER — SENNOSIDES-DOCUSATE SODIUM 8.6-50 MG PO TABS: 1.0000 | ORAL_TABLET | Freq: Every evening | ORAL | Status: AC | PRN

## 2011-11-18 MED ORDER — ATENOLOL 50 MG PO TABS: 50.0000 mg | ORAL_TABLET | Freq: Two times a day (BID) | ORAL | Status: DC

## 2011-11-18 MED ORDER — PRAVASTATIN SODIUM 40 MG PO TABS: 40.0000 mg | ORAL_TABLET | Freq: Every day | ORAL | Status: DC

## 2011-11-18 MED ORDER — ENALAPRIL MALEATE 20 MG PO TABS: 20.0000 mg | ORAL_TABLET | Freq: Every day | ORAL | Status: DC

## 2011-11-18 MED ORDER — AMLODIPINE BESYLATE 10 MG PO TABS: 5.0000 mg | ORAL_TABLET | Freq: Every day | ORAL | Status: DC

## 2011-11-18 MED ORDER — SIMVASTATIN 20 MG PO TABS: 20.0000 mg | ORAL_TABLET | Freq: Every day | ORAL | Status: DC

## 2011-11-18 MED ORDER — CYANOCOBALAMIN 500 MCG PO TABS: 1000.0000 ug | ORAL_TABLET | Freq: Every day | ORAL | Status: DC

## 2011-11-18 MED ORDER — ASPIRIN 325 MG PO TABS: 325.0000 mg | ORAL_TABLET | Freq: Every day | ORAL | Status: AC

## 2011-11-18 NOTE — Progress Notes (Signed)
Subjective: Pt feels better. Has no new complaints. No N/V, abd pain, CP, SOB. Says he is ready to go home. Objective: Vital signs in last 24 hours: Filed Vitals:   11/18/11 0655 11/18/11 1008 11/18/11 1426 11/18/11 1911  BP: 158/90 141/77 133/64 183/88  Pulse: 63 60 60 64  Temp: 98.1 F (36.7 C) 97.4 F (36.3 C) 98.1 F (36.7 C) 99 F (37.2 C)  TempSrc: Oral Oral Oral Oral  Resp: 20 20 20 20   Height:      Weight:      SpO2: 100% 100% 100% 96%   Weight change:   Intake/Output Summary (Last 24 hours) at 11/18/11 1947 Last data filed at 11/17/11 2233  Gross per 24 hour  Intake      3 ml  Output      0 ml  Net      3 ml   General: resting in bed HEENT: PERRL, EOMI, no scleral icterus Cardiac: RRR, no rubs, murmurs or gallops Pulm: clear to auscultation bilaterally, moving normal volumes of air Abd: soft, nontender, nondistended, BS present Ext: warm and well perfused, no pedal edema Neuro: alert and oriented X3, slurred speech.  Lab Results: Basic Metabolic Panel:  Lab [id number] 11/16/11 1728  NA 140 138  K 3.6 3.6  CL 105 105  CO2 23 21  GLUCOSE 121* 96  BUN 14 12  CREATININE 1.13 1.10  CALCIUM 9.4 9.2  MG -- --  PHOS -- --   Liver Function Tests:  Lab 11/17/11 0537 11/16/11 1728  AST 16 18  ALT 13 14  ALKPHOS 66 70  BILITOT 0.5 0.3  PROT 7.3 7.7  ALBUMIN 3.2* 3.4*  CBC:  Lab 11/17/11 0537 11/16/11 1728  WBC 7.5 7.7  NEUTROABS -- 3.0  HGB 15.1 14.9  HCT 44.3 42.9  MCV 81.4 81.6  PLT 203 212   Cardiac Enzymes:  Lab 11/17/11 1505 11/17/11 0854 11/16/11 1728  CKTOTAL 169 181 202  CKMB 2.5 2.7 2.8  CKMBINDEX -- -- --  TROPONINI <0.30 <0.30 <0.30   CBG:  Lab 11/18/11 1632 11/18/11 1130 11/18/11 0653 11/17/11 2213 11/17/11 2022 11/17/11 1641  GLUCAP 109* 88 121* 112* 108* 136*   Hemoglobin A1C:  Lab 11/17/11 0537  HGBA1C 6.8*   Fasting Lipid Panel:  Lab 11/17/11 0537  CHOL 218*  HDL 35*  LDLCALC 146*  TRIG 186*  CHOLHDL 6.2    LDLDIRECT --   Coagulation:  Lab 11/16/11 1728  LABPROT 13.8  INR 1.04   Urine Drug Screen: Drugs of Abuse     Component Value Date/Time   LABOPIA NONE DETECTED 11/16/2011 1641   COCAINSCRNUR NONE DETECTED 11/16/2011 1641   LABBENZ NONE DETECTED 11/16/2011 1641   AMPHETMU NONE DETECTED 11/16/2011 1641   THCU NONE DETECTED 11/16/2011 1641   LABBARB NONE DETECTED 11/16/2011 1641    Alcohol Level:  Lab 11/16/11 1728  ETH <11   Studies/Results: Mr Jodene Nam Head Wo Contrast  11/16/2011  *RADIOLOGY REPORT*  Clinical Data:  Abnormal speech.  Rule out stroke  MRI HEAD WITHOUT CONTRAST MRA HEAD WITHOUT CONTRAST  Technique:  Multiplanar, multiecho pulse sequences of the brain and surrounding structures were obtained without intravenous contrast. Angiographic images of the head were obtained using MRA technique without contrast.  Comparison:  CT 11/16/2011  MRI HEAD  Findings:  Acute infarct in the deep white matter of the left cerebral hemisphere.  This measures approximately 12 mm in diameter.  Chronic microvascular ischemic changes are present  in the white matter.  Chronic hemorrhagic infarct in the high left frontal lobe. The brainstem is intact.  Negative for mass lesion.  Chronic sinusitis with mucosal thickening in the paranasal sinuses.  IMPRESSION: Acute infarct in the deep periventricular white matter on the left.  Moderate chronic microvascular ischemic changes.  MRA HEAD  Findings: There is intracranial atherosclerotic disease.  No large vessel occlusion.  There is irregularity of the distal vertebral arteries bilaterally.  There is atherosclerotic disease in the basilar artery with mild to moderate stenosis.  There is atherosclerotic irregularity in the posterior cerebral arteries bilaterally with moderate to severe stenoses bilaterally.  Internal carotid artery is patent bilaterally with atherosclerotic irregularity in the cavernous segment.  Diffuse atherosclerotic irregularity in anterior and  middle cerebral arteries without high- grade stenosis.  Negative for aneurysm.  IMPRESSION: Diffuse intracranial atherosclerotic disease.  There is a atherosclerotic disease throughout the basilar artery with moderate to severe stenoses in the posterior cerebral artery bilaterally.  Original Report Authenticated By: Truett Perna, M.D.   Mr Brain Wo Contrast  11/16/2011  *RADIOLOGY REPORT*  Clinical Data:  Abnormal speech.  Rule out stroke  MRI HEAD WITHOUT CONTRAST MRA HEAD WITHOUT CONTRAST  Technique:  Multiplanar, multiecho pulse sequences of the brain and surrounding structures were obtained without intravenous contrast. Angiographic images of the head were obtained using MRA technique without contrast.  Comparison:  CT 11/16/2011  MRI HEAD  Findings:  Acute infarct in the deep white matter of the left cerebral hemisphere.  This measures approximately 12 mm in diameter.  Chronic microvascular ischemic changes are present in the white matter.  Chronic hemorrhagic infarct in the high left frontal lobe. The brainstem is intact.  Negative for mass lesion.  Chronic sinusitis with mucosal thickening in the paranasal sinuses.  IMPRESSION: Acute infarct in the deep periventricular white matter on the left.  Moderate chronic microvascular ischemic changes.  MRA HEAD  Findings: There is intracranial atherosclerotic disease.  No large vessel occlusion.  There is irregularity of the distal vertebral arteries bilaterally.  There is atherosclerotic disease in the basilar artery with mild to moderate stenosis.  There is atherosclerotic irregularity in the posterior cerebral arteries bilaterally with moderate to severe stenoses bilaterally.  Internal carotid artery is patent bilaterally with atherosclerotic irregularity in the cavernous segment.  Diffuse atherosclerotic irregularity in anterior and middle cerebral arteries without high- grade stenosis.  Negative for aneurysm.  IMPRESSION: Diffuse intracranial  atherosclerotic disease.  There is a atherosclerotic disease throughout the basilar artery with moderate to severe stenoses in the posterior cerebral artery bilaterally.  Original Report Authenticated By: Truett Perna, M.D.   Medications: I have reviewed the patient's current medications. Scheduled Meds:   . allopurinol  300 mg Oral Daily  . aspirin  300 mg Rectal Daily   Or  . aspirin  325 mg Oral Daily  . enoxaparin (LOVENOX) injection  40 mg Subcutaneous Q24H  . simvastatin  20 mg Oral q1800  . sodium chloride  3 mL Intravenous Q12H   Continuous Infusions:  PRN Meds:.sodium chloride, senna-docusate, sodium chloride Assessment/Plan:  1. Stroke: Pt MRI showed an acute infarct in the deep white matter of L cerebral hemisphere about 101mm in diameter and chronic hemorrhagic infarct in the high left frontal lobe. Pt continues to have problems with his speech, but no other prominent deficits.  - No PT needs. - Speech Therapy at [facility]- will try to arrange for that after working with Case Manager tomorrow. - A  preliminary read of the carotid doppler indicates that there is no ICA stenosis, but the right external carotid show increased velocities. This may be due to plaque formation as pt has significant arthrosclerosis on MRA.  -Continue ASA  -ECHO - EF- [id number]- vigorous systolic activity. Mildly dilated right atrium and moderate TR. -  DC home with followup at [facility] .  2. Hyperlipidemia: Started on Lipitor. - Patient will get medication from the [place] as per case manager . - Patient instructed to go to clinic for getting from [place] ER at [facility].  3. Hypertension- Pt restarted on blood pressure medication on discharge. -  given all prescriptions.   4. Gout- Pt's uric acid is currently 12.2; the goal of therapy is a value <6. Because of his previously discussed issues with acquiring medication it is very likely that he has not been taking the prescribed dose of allopurinol regularly. For this  reason we will not change his dosage at this time.  -Continue allopurinol 300mg   at discharge   5. DM- Pt's diabetes is diet controlled, and his HgbA1c is 6.8. It seems that this is working well for him. His BG values have been in the 130s-140s today. - Diet controlled no medications at discharge.  6. Social:  Education officer, museum and case with you helped for regimen for outpatient medication. - Per the notes, patient can get medication from [place] and is instructed to do that. Prescriptions given. --[facility]  --Case manager is making a request for outpatient speech therapy; will need recommendations for what pt need from inpatient speech team-  will talk with case manager for outpatient speech therapy at [facility] with the patient in August by [place] this may be ?T-cell lymphoma.   7.DVT pptx: lovenonx 40mg     LOS: 2 days   Sean Collins 11/18/2011, 7:47 PM

## 2011-11-18 NOTE — Evaluation (Signed)
Physical Therapy Evaluation Patient Details Name: Tully Sakamoto Urbach MRN: [id number] DOB: 23-Mar-1951 Today's Date: 11/18/2011 Time: [id number] PT Time Calculation (min): 9 min  PT Assessment / Plan / Recommendation Clinical Impression  Pt presents with stroke like symptoms including slurred speech. Pt is at his baseline functional level, no acute PT needs. Will not follow.    PT Assessment  Patent does not need any further PT services    Follow Up Recommendations  No PT follow up       Equipment Recommendations  None recommended by PT          Precautions / Restrictions Precautions Precautions: None Restrictions Weight Bearing Restrictions: No         Mobility  Bed Mobility Bed Mobility: Not assessed Transfers Transfers: Sit to Stand;Stand to Sit Sit to Stand: 6: Modified independent (Device/Increase time) Stand to Sit: 6: Modified independent (Device/Increase time) Ambulation/Gait Ambulation/Gait Assistance: 6: Modified independent (Device/Increase time) Ambulation Distance (Feet): 400 Feet Assistive device: None Ambulation/Gait Assistance Details: No deficits or need for physical assist. 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) Home Access: Level entry Home Layout: Other (Comment) (elevator to other levels) Bathroom Shower/Tub: Tub/shower unit;Curtain Biochemist, clinical: Standard Bathroom Accessibility: Yes How Accessible: Accessible  via walker [place]: None Prior Function Level of Independence: Independent Able to Take Stairs?: Yes Driving: No Vocation: Part time employment Communication Communication: No difficulties Dominant Hand: Right    Cognition  Overall Cognitive Status: Appears within functional limits for tasks assessed/performed Arousal/Alertness: Awake/alert Orientation Level: Appears intact for tasks assessed Behavior During Session: [facility] for tasks performed    Extremity/Trunk Assessment Right Lower Extremity Assessment RLE ROM/Strength/Tone: Within functional levels RLE Sensation: WFL - Light Touch Left Lower Extremity Assessment LLE ROM/Strength/Tone: Within functional levels LLE Sensation: WFL - Light Touch   Balance Balance Balance Assessed: Yes (no balance deficits during gait)  End of Session PT - End of Session Equipment Utilized During Treatment: Gait belt Activity Tolerance: Patient tolerated treatment well Patient left: in chair;with call bell/phone within reach Nurse Communication: Mobility status    Ambrose Finland 11/18/2011, 5:28 PM 11/18/2011 Ambrose Finland DPT PAGER: [phone] OFFICE: [phone]

## 2011-11-18 NOTE — Discharge Summary (Signed)
Pt D/C home  with discharged instruction and  Medication scripts given per MD orders discharge education completed .  Pt  demonstrate understanding by stating two of his risk factors of stroke and how to control them. Accompanied by his brother and nursing staff to  the hospital lobby in stable condition.

## 2011-11-18 NOTE — Evaluation (Signed)
Speech Language Pathology Evaluation Patient Details Name: Sean Collins MRN: [id number] DOB: 02/26/1951 Today's Date: 11/18/2011 Time: 1100-1145 SLP Time Calculation (min): 45 min  Problem List:  Patient Active Problem List  Diagnosis  . Hypertension  . Tobacco abuse  . Hyperlipidemia  . Diabetes mellitus  . Gout  . History of stroke  . CAD (coronary artery disease)  . Acute ischemic stroke  . Speech or language deficit, post-stroke   Past Medical History:  Past Medical History  Diagnosis Date  . Hypertension   . Hyperlipidemia   . Stroke    Past Surgical History: History reviewed. No pertinent past surgical history. HPI:  Sean Collins has a hx of past stroke and MI s/p multiple stents last one 7 months ago, HTN, HLP, gout, DM that is diet controlled. Today Sean Collins said at 9/9:30 am Sean Collins began slurring his speech and felt that Sean Collins was having trouble finding Sean words to speak. These were Sean exact similar symptoms Sean Collins had with his previous stroke, which left him with no residual deficits. Sean Collins states that Sean Collins didn't experience any muscle weakness, change in ambulation, vision changes, HA, n/v/d, diaphoresis, CP, SOB, or abdominal pain. Sean Collins reports being in his baseline health before and during and that Sean only frustrating change was this slurring of speech. Sean Collins said that Sean Collins felt his symptoms would improve but they didn't and therefore when Sean Collins finally called EMS Sean Collins was outside [facility] window. Sean Collins is a current 0.5 ppd smoker did quit for a period of time but has started to smoke again especially when his brother just recently died 2 months ago s/p stroke. Sean Collins has had to move to [place] [employer] because of his inability to pay Sean rent at his brother's place where Sean Collins was living. Sean Collins is also unable to pay for his medications regularly and reports stopping his ASA about 4 months ago and his pravastatin Sean Collins takes inconsistently. His PCP is at [place] in [place], [place]. Sean Collins denied any other illicit drugs past  or present. Sean Collins has a strong family hx of early death by strokes. [name], [name], equal strength, and only slurred speech as a deficit. Sean Collins didn't feel that his speech had improved during our interview. 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 Patient referred for Cognitive Linguistic Evaluation per stroke protocol.    Assessment / Plan / Recommendation Clinical Impression  Minimal to moderate dysarthria affecting intelligibility of connected speech at sentence and conversation level.  Strategies of slow rate of speech and increased vocal intensity effective in increasing intelligibility.  Question right side visual neglect affecting visual acuity during reading portion of evaluation.  Minimal deficit in area of organization at verbal complex.  No further Skilled ST warranted in acute care setting. Recommend ST consult at next level of care.  ST to sign off.     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) Education: GED Vocation: Part time employment   Cognition  Overall Cognitive Status: Appears within functional limits for tasks assessed Arousal/Alertness: Awake/alert Orientation Level: Oriented X4 Memory: Appears intact  Awareness: Appears intact Problem Solving: Appears intact Safety/Judgment: Appears intact    Comprehension  Auditory Comprehension Overall Auditory Comprehension: Appears within functional limits for tasks assessed    Expression Expression Primary Mode of Expression: Verbal Verbal Expression Overall Verbal Expression: Appears within functional limits for tasks assessed Written  Expression Dominant Hand: Right Written Expression: Within Functional Limits   Oral / Motor Oral Motor/Sensory Function Overall Oral Motor/Sensory Function: Impaired Labial ROM: Reduced right Labial Symmetry: Abnormal symmetry right Labial Strength: Reduced Labial Sensation: Reduced Lingual ROM: Reduced right Lingual Symmetry: Abnormal symmetry right Lingual Strength: Reduced Lingual Sensation: Reduced Facial ROM: Reduced right Facial Symmetry: Right droop Facial Sensation: Reduced Velum: Within Functional Limits Mandible: Within Functional Limits Motor Speech Overall Motor Speech: Impaired Respiration: Within functional limits Phonation: Hoarse;Low vocal intensity;Breathy Resonance: Within functional limits Articulation: Impaired Level of Impairment: Sentence Intelligibility: Intelligibility reduced Word: 75-100% accurate Phrase: 50-74% accurate Sentence: 25-49% accurate Conversation: 25-49% accurate Motor Planning: Impaired Level of Impairment: Insurance risk surveyor Errors: Aware Effective Techniques: Slow rate;Increased vocal intensity;Over-articulate   Sharman Crate [place], [place]     [facility] 11/18/2011, 12:26 PM

## 2011-11-19 NOTE — Discharge Summary (Signed)
[facility] Discharge Note  Name: Sean Collins MRN: [id number] DOB: [birth year] 61 y.o.  Date of Admission: 11/16/2011  4:16 PM Date of Discharge: 11/19/2011 Attending Physician: Dr. Oval Linsey  Discharge Diagnosis: Principal Problem:  *Acute ischemic stroke Active Problems:  Hypertension  Tobacco abuse  Hyperlipidemia  Diabetes mellitus  Gout  History of stroke  CAD (coronary artery disease)  Speech or language deficit, post-stroke   Discharge Medications: Medication List  As of 11/19/2011 12:14 PM   STOP taking these medications         pravastatin 40 MG tablet         TAKE these medications         allopurinol 300 MG tablet   Commonly known as: ZYLOPRIM   Take 1 tablet (300 mg total) by mouth daily.      amLODipine 10 MG tablet   Commonly known as: NORVASC   Take 0.5 tablets (5 mg total) by mouth daily.      aspirin 325 MG tablet   Take 1 tablet (325 mg total) by mouth daily.      atenolol 50 MG tablet   Commonly known as: TENORMIN   Take 1 tablet (50 mg total) by mouth 2 (two) times daily.      cyanocobalamin 500 MCG tablet   Take 2 tablets (1,000 mcg total) by mouth daily.      enalapril 20 MG tablet   Commonly known as: VASOTEC   Take 1 tablet (20 mg total) by mouth daily.      senna-docusate 8.6-50 MG per tablet   Commonly known as: Senokot-S   Take 1 tablet by mouth at bedtime as needed.      simvastatin 20 MG tablet   Commonly known as: ZOCOR   Take 1 tablet (20 mg total) by mouth daily.            Disposition and follow-up:   Sean Collins was discharged from [facility] in Stable condition.  At the hospital follow up visit at [facility] clinic please address Speech Therapy and Physical Therapy and also management of her chronic medications as patient is having hard time for access of medications.  Prescription for Out- pt speech therapy provided to Case Manager for follow up with  [place]. Pt instructed to get meds filled from [place]. Also has an appointment in August for skin biopsy.  Follow-up Appointments: Follow-up Information    Follow up with Provider Not In System on 11/23/2011. [facility]         Discharge Orders    Future Orders Please Complete By Expires   Diet - low sodium heart healthy      Increase activity slowly      Discharge instructions      Comments:   Please follow up with [facility] on 11/23/11.  Also make an appointment for follow up with a doctor in 1-2 weeks. You will need Speech Therapy - which can be arranged at [facility] and will contact them for that. Also for the prescriptions- you can go to [facility] ER on the weekends or to clinic on weekdays. Please take all your medications regularly as prescribed.   Call MD for:  temperature >100.4      Call MD for:  persistant nausea and vomiting      Call MD for:  severe uncontrolled pain      Call MD for:  extreme fatigue  Call MD for:  persistant dizziness or light-headedness         Consultations:    Procedures Performed:  Ct Head Wo Contrast  11/16/2011  *RADIOLOGY REPORT*  Clinical Data: 61 year old male with slurred speech.  History of stroke.  CT HEAD WITHOUT CONTRAST  Technique:  Contiguous axial images were obtained from the base of the skull through the vertex without contrast.  Comparison: None.  Findings: Left ethmoid sinus opacification.  Other Visualized paranasal sinuses and mastoids are clear.  Orbit and scalp soft tissues are within normal limits. No acute osseous abnormality identified.  Calcified atherosclerosis at the skull base.  Small chronic anterior right frontal lobe cortical infarct with subjacent white matter gliosis.  Patchy bilateral cerebral white matter hypodensity elsewhere.  No ventriculomegaly. No midline shift, mass effect, or evidence of mass lesion.  No acute intracranial hemorrhage identified.  No changes of acute cortically based infarct  are identified in the brain parenchyma, however, there is conspicuous asymmetric hyperdensity of a left posterior MCA division branch on series 2 image 13.  This could simply be related to intracranial atherosclerosis which appears to be wide spread.  No other suspicious intracranial vascular hyperdensity.  IMPRESSION: 1.  No brain parenchyma changes of acute cortically based infarct. No hemorrhage or mass effect. 2.  Indeterminate hyperdensity of a left MCA posterior M2 branch, could be related to atherosclerosis - or acute clot if there is clinical evidence of acute left MCA infarct. 3.  Chronic small and medium size vessel ischemia.  Original Report Authenticated By: Randall An, M.D.   Mr Jodene Nam Head Wo Contrast  11/16/2011  *RADIOLOGY REPORT*  Clinical Data:  Abnormal speech.  Rule out stroke  MRI HEAD WITHOUT CONTRAST MRA HEAD WITHOUT CONTRAST  Technique:  Multiplanar, multiecho pulse sequences of the brain and surrounding structures were obtained without intravenous contrast. Angiographic images of the head were obtained using MRA technique without contrast.  Comparison:  CT 11/16/2011  MRI HEAD  Findings:  Acute infarct in the deep white matter of the left cerebral hemisphere.  This measures approximately 12 mm in diameter.  Chronic microvascular ischemic changes are present in the white matter.  Chronic hemorrhagic infarct in the high left frontal lobe. The brainstem is intact.  Negative for mass lesion.  Chronic sinusitis with mucosal thickening in the paranasal sinuses.  IMPRESSION: Acute infarct in the deep periventricular white matter on the left.  Moderate chronic microvascular ischemic changes.  MRA HEAD  Findings: There is intracranial atherosclerotic disease.  No large vessel occlusion.  There is irregularity of the distal vertebral arteries bilaterally.  There is atherosclerotic disease in the basilar artery with mild to moderate stenosis.  There is atherosclerotic irregularity in the posterior  cerebral arteries bilaterally with moderate to severe stenoses bilaterally.  Internal carotid artery is patent bilaterally with atherosclerotic irregularity in the cavernous segment.  Diffuse atherosclerotic irregularity in anterior and middle cerebral arteries without high- grade stenosis.  Negative for aneurysm.  IMPRESSION: Diffuse intracranial atherosclerotic disease.  There is a atherosclerotic disease throughout the basilar artery with moderate to severe stenoses in the posterior cerebral artery bilaterally.  Original Report Authenticated By: Truett Perna, M.D.   Mr Brain Wo Contrast  11/16/2011  *RADIOLOGY REPORT*  Clinical Data:  Abnormal speech.  Rule out stroke  MRI HEAD WITHOUT CONTRAST MRA HEAD WITHOUT CONTRAST  Technique:  Multiplanar, multiecho pulse sequences of the brain and surrounding structures were obtained without intravenous contrast. Angiographic images of the head were  obtained using MRA technique without contrast.  Comparison:  CT 11/16/2011  MRI HEAD  Findings:  Acute infarct in the deep white matter of the left cerebral hemisphere.  This measures approximately 12 mm in diameter.  Chronic microvascular ischemic changes are present in the white matter.  Chronic hemorrhagic infarct in the high left frontal lobe. The brainstem is intact.  Negative for mass lesion.  Chronic sinusitis with mucosal thickening in the paranasal sinuses.  IMPRESSION: Acute infarct in the deep periventricular white matter on the left.  Moderate chronic microvascular ischemic changes.  MRA HEAD  Findings: There is intracranial atherosclerotic disease.  No large vessel occlusion.  There is irregularity of the distal vertebral arteries bilaterally.  There is atherosclerotic disease in the basilar artery with mild to moderate stenosis.  There is atherosclerotic irregularity in the posterior cerebral arteries bilaterally with moderate to severe stenoses bilaterally.  Internal carotid artery is patent bilaterally with  atherosclerotic irregularity in the cavernous segment.  Diffuse atherosclerotic irregularity in anterior and middle cerebral arteries without high- grade stenosis.  Negative for aneurysm.  IMPRESSION: Diffuse intracranial atherosclerotic disease.  There is a atherosclerotic disease throughout the basilar artery with moderate to severe stenoses in the posterior cerebral artery bilaterally.  Original Report Authenticated By: Truett Perna, M.D.    2D Echo: 11/17/2011 Study Conclusions  - Left ventricle: The cavity size was normal. Wall thickness was increased in a pattern of moderate LVH. Systolic function was vigorous. The estimated ejection fraction was in the range of 65% to 70%. - Right atrium: The atrium was mildly dilated. - Tricuspid valve: Moderate regurgitation. - Pulmonary arteries: PA peak pressure: 47mm Hg (S).  Doppler Carotid: 11/17/2011 Summary: No evidence of internal carotid artery stenosis bilaterally. There is evidence of elevated right external carotid artery velocities. Bilateral antegrade vertebral artery flow.    Admission HPI: Mr. Nazzal has a hx of past stroke and MI s/p multiple stents last one 7 months ago, HTN, HLP, gout, DM that is diet controlled. Today he said at 9/9:30 am he began slurring his speech and felt that he was having trouble finding the words to speak. These were the exact similar symptoms he had with his previous stroke, which left him with no residual deficits. He states that he didn't experience any muscle weakness, change in ambulation, vision changes, HA, n/v/d, diaphoresis, CP, SOB, or abdominal pain. He reports being in his baseline health before and during and that the only frustrating change was this slurring of speech. The pt said that he felt his symptoms would improve but they didn't and therefore when he finally called EMS he was outside [facility] window. He is a current 0.5 ppd smoker did quit for a period of time but has started to smoke again  especially when his brother just recently died 2 months ago s/p stroke. He has had to move to the [employer] because of his inability to pay the rent at his brother's place where he was living. He is also unable to pay for his medications regularly and reports stopping his ASA about 4 months ago and his pravastatin he takes inconsistently. His PCP is at [place] in [place], [place]. He denied any other illicit drugs past or present. He has a strong family hx of early death by strokes. In the ED he was slightly hypertensive 169/84 and bradycardic 52, he was alert and oriented x3, equal strength, and only slurred speech as a deficit. He didn't feel that his speech had  improved during our interview. 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    Hospital Course by problem list: Principal Problem:  *Acute ischemic stroke Active Problems:  Hypertension  Tobacco abuse  Hyperlipidemia  Diabetes mellitus  Gout  History of stroke  CAD (coronary artery disease)  Speech or language deficit, post-stroke   1. Stroke: Pt MRI showed an acute infarct in the deep white matter of L cerebral hemisphere about 58mm in diameter and chronic hemorrhagic infarct in the high left frontal lobe. Pt continued to have problems with his speech, but no other prominent deficits.  - No PT needs.  - Speech Therapy at [facility]- prescription given to case manager to send to [facility] clinic. -Continue ASA  -ECHO - EF- [id number]- vigorous systolic activity. Mildly dilated right atrium and moderate TR.  - Carotid Dopplers- no ICA stenosis. Right ECA increased velocities. - Outpatient followup with [place] clinic.  2. Hyperlipidemia: Started on Lipitor.  - Patient will get medication from the [place] as per case manager .  - Patient instructed to go to clinic for getting from [place] ER at [place] on Zocor- which is probably cheaper than Lipitor.   3. Hypertension- Pt restarted on blood pressure medication on  discharge.  - given all prescriptions.   4. Gout- Pt's uric acid is currently 12.2; the goal of therapy is a value <6. Because of his previously discussed issues with acquiring medication it is very likely that he has not been taking the prescribed dose of allopurinol regularly. For this reason we will not change his dosage at this time.  -Continue allopurinol 300mg  at discharge   5. DM- Pt's diabetes is diet controlled, and his HgbA1c is 6.8. It seems that this is working well for him. His BG values have been in the 130s-140s today.  - Diet controlled no medications at discharge.   6. Social: Education officer, museum and case with you helped for regimen for outpatient medication.  - Per the notes, patient can get medication from [place] and is instructed to do that. Prescriptions given.  --[facility]  --Case manager is making a request for outpatient speech therapy; will need recommendations for what pt need from inpatient speech team- will talk with case manager for outpatient speech therapy at [facility] with the patient in August by [place] this may be ?T-cell lymphoma.     Discharge Vitals:  BP 183/88  Pulse 64  Temp 99 F (37.2 C) (Oral)  Resp 20  Ht 5\' 8"  (1.727 m)  Wt 213 lb 3 oz (96.7 kg)  BMI 32.41 kg/m2  SpO2 96%  Discharge Labs:  Results for orders placed during the hospital encounter of 11/16/11 (from the past 24 hour(s))  GLUCOSE, CAPILLARY     Status: Abnormal   Collection Time   11/18/11  4:32 PM      Component Value Range   Glucose-Capillary 109 (*) 70 - 99 mg/dL   Comment 1 Documented in Chart     Comment 2 Notify RN      Signed: Shanikka Wonders 11/19/2011, 12:14 PM   Time Spent on Discharge: 35 minutes.

## 2011-11-20 NOTE — Care Management (Signed)
   CARE MANAGEMENT NOTE 11/20/2011  Patient:  Sean Collins, Sean Collins   Account Number:  [id number]  Date Initiated:  11/17/2011  Documentation initiated by:  Lars Pinks  Subjective/Objective Assessment:   PT WAS ADMITTED WITH SLURRED SPEECH     Action/Plan:   PROGRESSION OF CARE AND DISCHARGE PLANNING   Anticipated DC Date:  11/19/2011   Anticipated DC Plan:  HOME/SELF CARE  In-house referral  Clinical Social Worker      DC Planning Services  CM consult      Choice offered to / List presented to:             Status of service:  Completed, signed off Medicare Important Message given?   (If response is "NO", the following Medicare IM given date fields will be blank) Date Medicare IM given:   Date Additional Medicare IM given:    Discharge Disposition:  HOME/SELF CARE  Per UR Regulation:  Reviewed for med. necessity/level of care/duration of stay  If discussed at [place] of Stay Meetings, dates discussed:    Comments:  11/20/2011 1130 ( Call from Dr. Posey Pronto on 7/13 for outpt ST. NCM explained that services can be set up on 7/15 because office is closed on weekends. Gave order for outpt ST.) 11/20/2011 Contacted pt and states he plans to go this appt on 7/18 at [facility]. States he is feeling well. Explained NCM will [facility]. [place] # [phone] and spoke with Dr. Kellie Shropshire nurse, Shirley [place] RN. States pt has appt on 7/18 with Dr. Suzzette Righter, [place] [facility] Physician. Faxed order, facesheet, speech therapy notes and d/c summary to PCP fax [phone] for follow up on Speech Therapy. Jonnie Finner RN CCM Case Mgmt phone [phone]  PCP: DR. Ree Edman- BERMUDEZ AT THE [facility]  11/17/11 Lars Pinks, RN, BSN 1643 PT WAS ADMITTED WITH SLURRED SPEECH.  PTA PT WAS LIVING AT THE [employer].  PT HAS PARTIAL [facility] BENEFITS AND HAS BEEN LAST SEEN AT THE [facility].  PT [facility] AT [facility] ON  [facility]Y 18TH AT [place].  PT ALSO HAS AN APPT ON AUG 23RD FOR A SURG CONSULT FOR A PUNCH BIOPSY PER [facility]ERM FOR ? T CELL LYMPHOMA OF THE RT UPPER CHEST.  I HAVE SENT PAPER WORK TO THE [facility] TO GET A CONSULT FOR SPEECH AS THIS MAYBE NEEDED AT [place] MAYBE OVER THE WEEKEND. IT WAS ALSO NOTED THAT PT MAY NEED ASSISTANCE WITH HIS MEDICATION AT DC.  PT CAN GO TO THE [facility] TO GET HIS SCRIPTS FILLED AT ANY TIME OR WAIT UNTIL MONDAY AND REPORT TO THE [facility] TO GET MEDS FILLED DURING THE WEEKDAY AND NORMAL OFFICE HRS.

## 2015-01-22 ENCOUNTER — Encounter (HOSPITAL_COMMUNITY): Payer: Self-pay

## 2015-01-22 ENCOUNTER — Inpatient Hospital Stay (HOSPITAL_COMMUNITY)
Admission: EM | Admit: 2015-01-22 | Discharge: 2015-01-26 | DRG: 065 | Disposition: A | Discharge status: Home / Self Care | Payer: Non-veteran care | Attending: Internal Medicine | Admitting: Internal Medicine

## 2015-01-22 ENCOUNTER — Emergency Department (HOSPITAL_COMMUNITY): Payer: Non-veteran care

## 2015-01-22 DIAGNOSIS — Z8673 Personal history of transient ischemic attack (TIA), and cerebral infarction without residual deficits: Secondary | ICD-10-CM | POA: Diagnosis not present

## 2015-01-22 DIAGNOSIS — I639 Cerebral infarction, unspecified: Secondary | ICD-10-CM | POA: Diagnosis present

## 2015-01-22 DIAGNOSIS — Z91199 Patient's noncompliance with other medical treatment and regimen due to unspecified reason: Secondary | ICD-10-CM | POA: Insufficient documentation

## 2015-01-22 DIAGNOSIS — I63511 Cerebral infarction due to unspecified occlusion or stenosis of right middle cerebral artery: Secondary | ICD-10-CM | POA: Diagnosis present

## 2015-01-22 DIAGNOSIS — N179 Acute kidney failure, unspecified: Secondary | ICD-10-CM | POA: Diagnosis present

## 2015-01-22 DIAGNOSIS — E119 Type 2 diabetes mellitus without complications: Secondary | ICD-10-CM | POA: Diagnosis present

## 2015-01-22 DIAGNOSIS — F17210 Nicotine dependence, cigarettes, uncomplicated: Secondary | ICD-10-CM | POA: Diagnosis present

## 2015-01-22 DIAGNOSIS — R29810 Facial weakness: Secondary | ICD-10-CM | POA: Diagnosis present

## 2015-01-22 DIAGNOSIS — R531 Weakness: Secondary | ICD-10-CM | POA: Diagnosis present

## 2015-01-22 DIAGNOSIS — M109 Gout, unspecified: Secondary | ICD-10-CM | POA: Diagnosis present

## 2015-01-22 DIAGNOSIS — Z9119 Patient's noncompliance with other medical treatment and regimen: Secondary | ICD-10-CM | POA: Diagnosis present

## 2015-01-22 DIAGNOSIS — Z8249 Family history of ischemic heart disease and other diseases of the circulatory system: Secondary | ICD-10-CM | POA: Diagnosis not present

## 2015-01-22 DIAGNOSIS — I10 Essential (primary) hypertension: Secondary | ICD-10-CM | POA: Diagnosis present

## 2015-01-22 DIAGNOSIS — E785 Hyperlipidemia, unspecified: Secondary | ICD-10-CM | POA: Diagnosis present

## 2015-01-22 DIAGNOSIS — R471 Dysarthria and anarthria: Secondary | ICD-10-CM | POA: Diagnosis present

## 2015-01-22 DIAGNOSIS — I252 Old myocardial infarction: Secondary | ICD-10-CM

## 2015-01-22 DIAGNOSIS — I6789 Other cerebrovascular disease: Secondary | ICD-10-CM | POA: Diagnosis not present

## 2015-01-22 LAB — CBC
HCT: 39.5 % (ref 39.0–52.0)
Hemoglobin: 13.6 g/dL (ref 13.0–17.0)
MCH: 29.1 pg (ref 26.0–34.0)
MCHC: 34.4 g/dL (ref 30.0–36.0)
MCV: 84.6 fL (ref 78.0–100.0)
Platelets: 244 10*3/uL (ref 150–400)
RBC: 4.67 MIL/uL (ref 4.22–5.81)
RDW: 15.2 % (ref 11.5–15.5)
WBC: 8.3 10*3/uL (ref 4.0–10.5)

## 2015-01-22 LAB — I-STAT TROPONIN, ED: TROPONIN I, POC: 0.02 ng/mL (ref 0.00–0.08)

## 2015-01-22 LAB — DIFFERENTIAL
BASOS ABS: 0 10*3/uL (ref 0.0–0.1)
BASOS PCT: 1 %
EOS ABS: 0.6 10*3/uL (ref 0.0–0.7)
Eosinophils Relative: 7 %
Lymphocytes Relative: 32 %
Lymphs Abs: 2.7 10*3/uL (ref 0.7–4.0)
Monocytes Absolute: 0.7 10*3/uL (ref 0.1–1.0)
Monocytes Relative: 9 %
NEUTROS PCT: 51 %
Neutro Abs: 4.3 10*3/uL (ref 1.7–7.7)

## 2015-01-22 LAB — COMPREHENSIVE METABOLIC PANEL
ALBUMIN: 3.3 g/dL — AB (ref 3.5–5.0)
ALT: 16 U/L — ABNORMAL LOW (ref 17–63)
AST: 24 U/L (ref 15–41)
Alkaline Phosphatase: 84 U/L (ref 38–126)
Anion gap: 10 (ref 5–15)
BUN: 22 mg/dL — AB (ref 6–20)
CHLORIDE: 108 mmol/L (ref 101–111)
CO2: 20 mmol/L — AB (ref 22–32)
Calcium: 9 mg/dL (ref 8.9–10.3)
Creatinine, Ser: 1.46 mg/dL — ABNORMAL HIGH (ref 0.61–1.24)
GFR calc Af Amer: 57 mL/min — ABNORMAL LOW (ref >60)
GFR calc non Af Amer: 49 mL/min — ABNORMAL LOW (ref >60)
GLUCOSE: 118 mg/dL — AB (ref 65–99)
POTASSIUM: 3.7 mmol/L (ref 3.5–5.1)
SODIUM: 138 mmol/L (ref 135–145)
Total Bilirubin: 0.4 mg/dL (ref 0.3–1.2)
Total Protein: 7.4 g/dL (ref 6.5–8.1)

## 2015-01-22 LAB — I-STAT CHEM 8, ED
BUN: 27 mg/dL — ABNORMAL HIGH (ref 6–20)
CHLORIDE: 109 mmol/L (ref 101–111)
CREATININE: 1.5 mg/dL — AB (ref 0.61–1.24)
Calcium, Ion: 1.14 mmol/L (ref 1.13–1.30)
Glucose, Bld: 118 mg/dL — ABNORMAL HIGH (ref 65–99)
HEMATOCRIT: 44 % (ref 39.0–52.0)
HEMOGLOBIN: 15 g/dL (ref 13.0–17.0)
POTASSIUM: 3.7 mmol/L (ref 3.5–5.1)
Sodium: 143 mmol/L (ref 135–145)
TCO2: 22 mmol/L (ref 0–100)

## 2015-01-22 LAB — PROTIME-INR
INR: 1.01 (ref 0.00–1.49)
Prothrombin Time: 13.5 seconds (ref 11.6–15.2)

## 2015-01-22 LAB — APTT: aPTT: 28 seconds (ref 24–37)

## 2015-01-22 LAB — GLUCOSE, CAPILLARY: GLUCOSE-CAPILLARY: 134 mg/dL — AB (ref 65–99)

## 2015-01-22 MED ORDER — SIMVASTATIN 20 MG PO TABS
20.0000 mg | ORAL_TABLET | Freq: Every day | ORAL | Status: DC
  Administered 2015-01-22 – 2015-01-23 (×2): 20 mg via ORAL
  Filled 2015-01-22 (×2): qty 1

## 2015-01-22 MED ORDER — PANTOPRAZOLE SODIUM 40 MG PO TBEC
40.0000 mg | DELAYED_RELEASE_TABLET | Freq: Every day | ORAL | Status: DC
  Administered 2015-01-23 – 2015-01-26 (×4): 40 mg via ORAL
  Filled 2015-01-22 (×4): qty 1

## 2015-01-22 MED ORDER — FOLIC ACID 1 MG PO TABS
1.0000 mg | ORAL_TABLET | Freq: Every day | ORAL | Status: DC
  Administered 2015-01-23 – 2015-01-26 (×4): 1 mg via ORAL
  Filled 2015-01-22 (×4): qty 1

## 2015-01-22 MED ORDER — ENOXAPARIN SODIUM 30 MG/0.3ML ~~LOC~~ SOLN
30.0000 mg | SUBCUTANEOUS | Status: DC
  Administered 2015-01-22 – 2015-01-24 (×3): 30 mg via SUBCUTANEOUS
  Filled 2015-01-22 (×3): qty 0.3

## 2015-01-22 MED ORDER — ASPIRIN 325 MG PO TABS
325.0000 mg | ORAL_TABLET | Freq: Every day | ORAL | Status: DC
  Administered 2015-01-22 – 2015-01-23 (×2): 325 mg via ORAL
  Filled 2015-01-22 (×2): qty 1

## 2015-01-22 MED ORDER — VITAMIN D 1000 UNITS PO TABS
1000.0000 [IU] | ORAL_TABLET | Freq: Every day | ORAL | Status: DC
  Administered 2015-01-23 – 2015-01-26 (×4): 1000 [IU] via ORAL
  Filled 2015-01-22 (×4): qty 1

## 2015-01-22 MED ORDER — SODIUM CHLORIDE 0.9 % IV SOLN
INTRAVENOUS | Status: DC
  Administered 2015-01-22 – 2015-01-26 (×5): via INTRAVENOUS

## 2015-01-22 MED ORDER — ATENOLOL 50 MG PO TABS
50.0000 mg | ORAL_TABLET | Freq: Two times a day (BID) | ORAL | Status: DC
  Administered 2015-01-23 – 2015-01-26 (×7): 50 mg via ORAL
  Filled 2015-01-22 (×7): qty 1

## 2015-01-22 MED ORDER — AMLODIPINE BESYLATE 10 MG PO TABS
10.0000 mg | ORAL_TABLET | Freq: Every day | ORAL | Status: DC
  Administered 2015-01-23 – 2015-01-26 (×4): 10 mg via ORAL
  Filled 2015-01-22 (×4): qty 1

## 2015-01-22 MED ORDER — INSULIN ASPART 100 UNIT/ML ~~LOC~~ SOLN: 0.0000 [IU] | Freq: Every day | SUBCUTANEOUS | Status: DC

## 2015-01-22 MED ORDER — ASPIRIN 300 MG RE SUPP: 300.0000 mg | Freq: Every day | RECTAL | Status: DC

## 2015-01-22 MED ORDER — ALLOPURINOL 300 MG PO TABS
300.0000 mg | ORAL_TABLET | Freq: Every day | ORAL | Status: DC
  Administered 2015-01-23 – 2015-01-26 (×4): 300 mg via ORAL
  Filled 2015-01-22 (×4): qty 1

## 2015-01-22 MED ORDER — SENNOSIDES-DOCUSATE SODIUM 8.6-50 MG PO TABS: 1.0000 | ORAL_TABLET | Freq: Every evening | ORAL | Status: DC | PRN

## 2015-01-22 MED ORDER — INSULIN ASPART 100 UNIT/ML ~~LOC~~ SOLN
0.0000 [IU] | Freq: Three times a day (TID) | SUBCUTANEOUS | Status: DC
  Administered 2015-01-24: 1 [IU] via SUBCUTANEOUS

## 2015-01-22 MED ORDER — STROKE: EARLY STAGES OF RECOVERY BOOK
Freq: Once | Status: AC
  Administered 2015-01-22
  Filled 2015-01-22: qty 1

## 2015-01-22 NOTE — Consult Note (Signed)
Admission H&P    Chief Complaint: New onset facial weakness and slurred speech  HPI: Sean Collins is an 64 y.o. male history of previous stroke, hypertension and hyperlipidemia resenting with new onset left facial droop and slurred speech with onset at 1600 today. No dose of left upper normal lower extremity area there were no sensory changes involving left extremities normal left side of the face. Patient has not been on antiplatelets therapy on a daily basis. ET scan of his head showed no acute changes. NIH stroke score was 2.  LSN: 1600 on 01/22/2015 tPA Given: No: Minimal deficits mRankin:  Past Medical History  Diagnosis Date  . Hypertension   . Hyperlipidemia   . Stroke     History reviewed. No pertinent past surgical history.  History: Positive for stroke  Social History:  reports that he has been smoking Cigarettes.  He has a 1 pack-year smoking history. He does not have any smokeless tobacco history on file. He reports that he does not drink alcohol or use illicit drugs.  Allergies: No Known Allergies  Medications: Patient's preadmission medications were reviewed by me.  ROS: History obtained from the patient  General ROS: negative for - chills, fatigue, fever, night sweats, weight gain or weight loss Psychological ROS: negative for - behavioral disorder, hallucinations, memory difficulties, mood swings or suicidal ideation Ophthalmic ROS: negative for - blurry vision, double vision, eye pain or loss of vision ENT ROS: negative for - epistaxis, nasal discharge, oral lesions, sore throat, tinnitus or vertigo Allergy and Immunology ROS: negative for - hives or itchy/watery eyes Hematological and Lymphatic ROS: negative for - bleeding problems, bruising or swollen lymph nodes Endocrine ROS: negative for - galactorrhea, hair pattern changes, polydipsia/polyuria or temperature intolerance Respiratory ROS: negative for - cough, hemoptysis, shortness of breath or  wheezing Cardiovascular ROS: negative for - chest pain, dyspnea on exertion, edema or irregular heartbeat Gastrointestinal ROS: negative for - abdominal pain, diarrhea, hematemesis, nausea/vomiting or stool incontinence Genito-Urinary ROS: negative for - dysuria, hematuria, incontinence or urinary frequency/urgency Musculoskeletal ROS: negative for - joint swelling or muscular weakness Neurological ROS: as noted in HPI Dermatological ROS: negative for rash and skin lesion changes  Physical Examination: Blood pressure 146/79, pulse 57, resp. rate 22, SpO2 96 %.  HEENT-  Normocephalic, no lesions, without obvious abnormality.  Normal external eye and conjunctiva.  Normal TM's bilaterally.  Normal auditory canals and external ears. Normal external nose, mucus membranes and septum.  Normal pharynx. Neck supple with no masses, nodes, nodules or enlargement. Cardiovascular - regular rate and rhythm, S1, S2 normal, no murmur, click, rub or gallop Lungs - chest clear, no wheezing, rales, normal symmetric air entry Abdomen - soft, non-tender; bowel sounds normal; no masses,  no organomegaly Extremities - no joint deformities, effusion, or inflammation, no edema and no skin discoloration  Neurologic Examination: Mental Status: Alert, oriented, thought content appropriate.  Speech was slightly slurred without evidence of aphasia. Able to follow commands without difficulty. Cranial Nerves: II-Visual fields were normal. III/IV/VI-Pupils were equal and reacted normally to light. Extraocular movements were full and conjugate.    V/VII-no facial numbness; slight left lower facial weakness. VIII-normal. X-mild dysarthria. XI: trapezius strength/neck flexion strength normal bilaterally XII-midline tongue extension with normal strength. Motor: 5/5 bilaterally with normal tone and bulk Sensory: Normal throughout. Deep Tendon Reflexes: 1+ and symmetric. Plantars: Mute bilaterally Cerebellar: Normal  finger-to-nose testing. Carotid auscultation: Normal  Results for orders placed or performed during the hospital encounter of 01/22/15 (  from the past 48 hour(s))  Protime-INR     Status: None   Collection Time: 01/22/15  5:57 PM  Result Value Ref Range   Prothrombin Time 13.5 11.6 - 15.2 seconds   INR 1.01 0.00 - 1.49  APTT     Status: None   Collection Time: 01/22/15  5:57 PM  Result Value Ref Range   aPTT 28 24 - 37 seconds  CBC     Status: None   Collection Time: 01/22/15  5:57 PM  Result Value Ref Range   WBC 8.3 4.0 - 10.5 K/uL   RBC 4.67 4.22 - 5.81 MIL/uL   Hemoglobin 13.6 13.0 - 17.0 g/dL   HCT 39.5 39.0 - 52.0 %   MCV 84.6 78.0 - 100.0 fL   MCH 29.1 26.0 - 34.0 pg   MCHC 34.4 30.0 - 36.0 g/dL   RDW 15.2 11.5 - 15.5 %   Platelets 244 150 - 400 K/uL  Differential     Status: None   Collection Time: 01/22/15  5:57 PM  Result Value Ref Range   Neutrophils Relative % 51 %   Neutro Abs 4.3 1.7 - 7.7 K/uL   Lymphocytes Relative 32 %   Lymphs Abs 2.7 0.7 - 4.0 K/uL   Monocytes Relative 9 %   Monocytes Absolute 0.7 0.1 - 1.0 K/uL   Eosinophils Relative 7 %   Eosinophils Absolute 0.6 0.0 - 0.7 K/uL   Basophils Relative 1 %   Basophils Absolute 0.0 0.0 - 0.1 K/uL  I-stat troponin, ED (not at [facility], [facility])     Status: None   Collection Time: 01/22/15  6:08 PM  Result Value Ref Range   Troponin i, poc 0.02 0.00 - 0.08 ng/mL   Comment 3            Comment: Due to the release kinetics of cTnI, a negative result within the first hours of the onset of symptoms does not rule out myocardial infarction with certainty. If myocardial infarction is still suspected, repeat the test at appropriate intervals.   I-Stat Chem 8, ED  (not at [facility], [facility])     Status: Abnormal   Collection Time: 01/22/15  6:09 PM  Result Value Ref Range   Sodium 143 135 - 145 mmol/L   Potassium 3.7 3.5 - 5.1 mmol/L   Chloride 109 101 - 111 mmol/L   BUN 27 (H) 6 - 20 mg/dL   Creatinine, Ser 1.50 (H) 0.61  - 1.24 mg/dL   Glucose, Bld 118 (H) 65 - 99 mg/dL   Calcium, Ion 1.14 1.13 - 1.30 mmol/L   TCO2 22 0 - 100 mmol/L   Hemoglobin 15.0 13.0 - 17.0 g/dL   HCT 44.0 39.0 - 52.0 %   Ct Head Wo Contrast  01/22/2015   CLINICAL DATA:  code stroke  Slurred speech and left-sided facial droop.  EXAM: CT HEAD WITHOUT CONTRAST  TECHNIQUE: Contiguous axial images were obtained from the base of the skull through the vertex without intravenous contrast.  COMPARISON:  Brain MRI and head CT, 11/16/2011.  FINDINGS: Ventricles are normal configuration. There is ventricular and sulcal enlargement reflecting moderate atrophy, advanced for age. No hydrocephalus.  There are no parenchymal masses or mass effect. There is no evidence of a recent cortical infarct.  Patchy white matter hypoattenuation is noted consistent with chronic microvascular ischemic change. There are few more well-defined small focal hypo attenuating areas in the white matter consistent with old lacune infarcts. There is a small  old left anterior frontal lobe cortical infarct.  There are no extra-axial masses or abnormal fluid collections.  There is no intracranial hemorrhage.  Mucosal thickening fills several middle and posterior left ethmoid air cells, similar to the prior CT. Mastoid air cells are clear. No skull lesion.  IMPRESSION: 1. No acute intracranial abnormalities. No evidence of a recent cortical infarct and no intracranial hemorrhage. 2. Moderate atrophy, mild chronic microvascular ischemic change and several old deep white matter lacune infarcts as well as a small old left frontal lobe cortical infarct. These results were called by telephone at the time of interpretation on 01/22/2015 at 6:16 pm to Dr. Nicole Kindred, who verbally acknowledged these results.   Electronically Signed   By: Lajean Manes M.D.   On: 01/22/2015 18:17    Assessment: 64 y.o. male with multiple risk factors for stroke presenting with probable recurrent subcortical right MCA  territory small vessel TIA or stroke.  Stroke Risk Factors - family history, hyperlipidemia and hypertension  Plan: 1. HgbA1c, fasting lipid panel 2. MRI, MRA  of the brain without contrast 3. PT consult, OT consult, Speech consult 4. Echocardiogram 5. Carotid dopplers 6. Prophylactic therapy-Antiplatelet med: Aspirin 7. Risk factor modification 8. Telemetry monitoring  C.R. Nicole Kindred, MD [facility] [phone]  01/22/2015, 6:44 PM

## 2015-01-22 NOTE — Code Documentation (Signed)
Patient at home getting ready for work when he started slurring his words and his mouth felt "heavy".  Code Stroke called at 1738, Patient LKW at 1600.  He arrived via EMS at 1751.  Stat head Ct and labs done.  NIHSS 2 left facial droop and slurred speech. Dr Nicole Kindred at bedside to assess patient.  Patient remains in the window for TPA until 2030.  RN to call if symptoms worsen.  Patient states that he take a "blood thinner" but does not know the name of the medication.

## 2015-01-22 NOTE — ED Notes (Signed)
Per EMS: Pt from [facility], began experiencing slurred speech and L sided facial droop. Pt has hx of stroke, unknown if any deficits. Pt states he is on a blood thinner, unknown what med, or dose. LSN: 1600. MD at bedside.

## 2015-01-22 NOTE — ED Provider Notes (Signed)
CSN: [id number]     Arrival date & time 01/22/15  1751 History   First MD Initiated Contact with Patient 01/22/15 1752     Chief Complaint  Patient presents with  . Code Stroke  LEVEL 5 CAVEAT DUE TO ACUITY OF CONDITION  An emergency department physician performed an initial assessment on this suspected stroke patient at 71.  Patient is a 64 y.o. male presenting with weakness. The history is provided by the patient. The history is limited by the condition of the patient.  Weakness This is a new problem. The current episode started 1 to 2 hours ago. The problem occurs constantly. The problem has not changed since onset.Pertinent negatives include no chest pain. Nothing aggravates the symptoms. Nothing relieves the symptoms.  PT PRESENTS FOR FACIAL WEAKNESS AND SLURRED SPEECH ONSET AT APPROXIMATELY 1600 HE HAS NO OTHER COMPLAINTS   Past Medical History  Diagnosis Date  . Hypertension   . Hyperlipidemia   . Stroke    History reviewed. No pertinent past surgical history. History reviewed. No pertinent family history. Social History  Substance Use Topics  . Smoking status: Current Every Day Smoker -- 0.50 packs/day for 2 years    Types: Cigarettes  . Smokeless tobacco: None  . Alcohol Use: No    Review of Systems  Unable to perform ROS: Acuity of condition  Cardiovascular: Negative for chest pain.  Neurological: Positive for weakness.      Allergies  Review of patient's allergies indicates no known allergies.  Home Medications   Prior to Admission medications   Medication Sig Start Date End Date Taking? Authorizing Provider  allopurinol (ZYLOPRIM) 300 MG tablet Take 1 tablet (300 mg total) by mouth daily. 11/18/11 11/17/12  [name], MD  amLODipine (NORVASC) 10 MG tablet Take 0.5 tablets (5 mg total) by mouth daily. 11/18/11   [name], MD  atenolol (TENORMIN) 50 MG tablet Take 1 tablet (50 mg total) by mouth 2 (two) times daily. 11/18/11   [name], MD   cyanocobalamin 500 MCG tablet Take 2 tablets (1,000 mcg total) by mouth daily. 11/18/11   [name], MD  enalapril (VASOTEC) 20 MG tablet Take 1 tablet (20 mg total) by mouth daily. 11/18/11   [name], MD  simvastatin (ZOCOR) 20 MG tablet Take 1 tablet (20 mg total) by mouth daily. 11/18/11 11/17/12  [name], MD   BP 138/76 mmHg  Pulse 58  Resp 10  SpO2 99% Physical Exam CONSTITUTIONAL: Well developed/well nourished HEAD: Normocephalic/atraumatic EYES: EOMI ENMT: Mucous membranes moist NECK: supple no meningeal signs CV: S1/S2 noted, no murmurs/rubs/gallops noted LUNGS: Lungs are clear to auscultation bilaterally, no apparent distress ABDOMEN: soft, nontender, no rebound or guarding, bowel sounds noted throughout abdomen NEURO: Pt is awake/alert/appropriate, moves all extremitiesx4. Left facial droop.  No arm drift NIHSS 2 (exam performed by neuro team and I was at bedside) EXTREMITIES: pulses normal/equal, full ROM SKIN: warm, color normal PSYCH: no abnormalities of mood noted, alert and oriented to situation  ED Course  Procedures  tPA in stroke considered but not given due to: Rapid improvement/severity mild D/w dr Wallie Char He is not a TPA candidate He recommends admit to medicine 6:43 PM D/w dr Eulas Post Will admit to [facility] Pt stable in ED Labs Review Labs Reviewed  I-STAT CHEM 8, ED - Abnormal; Notable for the following:    BUN 27 (*)    Creatinine, Ser 1.50 (*)    Glucose,  Bld 118 (*)    All other components within normal limits  PROTIME-INR  APTT  CBC  DIFFERENTIAL  COMPREHENSIVE METABOLIC PANEL  I-STAT TROPOININ, ED  CBG MONITORING, ED    Imaging Review Ct Head Wo Contrast  01/22/2015   CLINICAL DATA:  code stroke  Slurred speech and left-sided facial droop.  EXAM: CT HEAD WITHOUT CONTRAST  TECHNIQUE: Contiguous axial images were obtained from the base of the skull through the vertex without intravenous contrast.   COMPARISON:  Brain MRI and head CT, 11/16/2011.  FINDINGS: Ventricles are normal configuration. There is ventricular and sulcal enlargement reflecting moderate atrophy, advanced for age. No hydrocephalus.  There are no parenchymal masses or mass effect. There is no evidence of a recent cortical infarct.  Patchy white matter hypoattenuation is noted consistent with chronic microvascular ischemic change. There are few more well-defined small focal hypo attenuating areas in the white matter consistent with old lacune infarcts. There is a small old left anterior frontal lobe cortical infarct.  There are no extra-axial masses or abnormal fluid collections.  There is no intracranial hemorrhage.  Mucosal thickening fills several middle and posterior left ethmoid air cells, similar to the prior CT. Mastoid air cells are clear. No skull lesion.  IMPRESSION: 1. No acute intracranial abnormalities. No evidence of a recent cortical infarct and no intracranial hemorrhage. 2. Moderate atrophy, mild chronic microvascular ischemic change and several old deep white matter lacune infarcts as well as a small old left frontal lobe cortical infarct. These results were called by telephone at the time of interpretation on 01/22/2015 at 6:16 pm to Dr. Nicole Kindred, who verbally acknowledged these results.   Electronically Signed   By: Lajean Manes M.D.   On: 01/22/2015 18:17   I have personally reviewed and evaluated these lab results as part of my medical decision-making.   EKG Interpretation   Date/Time:  Friday January 22 2015 18:10:08 EDT Ventricular Rate:  60 PR Interval:  174 QRS Duration: 118 QT Interval:  436 QTC Calculation: 436 R Axis:   -41 Text Interpretation:  Sinus rhythm Incomplete left bundle branch block t  wave changes noted in prior EKG Confirmed by Christy Gentles  MD, Elenore Rota ([id number])  on 01/22/2015 6:14:13 PM      MDM   Final diagnoses:  Stroke    Nursing notes including past medical history and social  history reviewed and considered in documentation Labs/vital reviewed myself and considered during evaluation     Ripley Fraise, MD 01/22/15 1845

## 2015-01-22 NOTE — H&P (Signed)
[facility] History and Physical  Sean Collins [id number] DOB: 05-May-1951 DOA: 01/22/2015  PCP: Laureen Ochs of [facility]  Chief Complaint: Slurred speech  HPI: Sean Collins is a 64 y.o. gentleman with a history of prior CVA, HTN, and active tobacco use who was in his baseline state of health until approximately 4PM today.  While at work, he developed the acute onset of slurred speech and word finding difficulty.  He reports that his lips "felt heavy".  He denies associated headache or vision disturbance.  No syncope.  No weakness in his extremities.  No falls.  He reported to the ED for further evaluation.  CT of his head, without contrast, does not show acute abnormality, but his symptoms are concerning for stroke.  He has been evaluated by neurology (Dr. Nicole Kindred) in the ED; hospitalist asked to admit.  Review of Systems: 12 systems reviewed and negative except as stated in HPI.  Past Medical History  Diagnosis Date  . Hypertension   . Hyperlipidemia   . Stroke   GOUT Left inguinal hernia DM (previously documented)  PAST SURGICAL HISTORY: The patient reports several orthopedic interventions in the past; otherwise, he denies history of any major surgeries.  Social History:  Social History   Social History Narrative  Active tobacco use; 2-3 packs per week.  Remote EtOH and drug use.  Divorced.  No children.  His brother is his next of kin.  No Known Allergies  FAMILY HISTORY: HTN and DM "run in the family".  Several aunts died of complications related to cancer; type unknown.  Prior to Admission medications   Medication Sig Start Date End Date Taking? Authorizing Provider  allopurinol (ZYLOPRIM) 300 MG tablet Take 300 mg by mouth daily.   Yes Historical Provider, MD  amLODipine (NORVASC) 10 MG tablet Take 0.5 tablets (5 mg total) by mouth daily. Patient taking differently: Take 10 mg by mouth daily.  11/18/11  Yes [name], MD   aspirin EC 81 MG tablet Take 81 mg by mouth every 4 (four) hours as needed for mild pain or moderate pain.   Yes Historical Provider, MD  atenolol (TENORMIN) 50 MG tablet Take 1 tablet (50 mg total) by mouth 2 (two) times daily. 11/18/11  Yes [name], MD  carboxymethylcellulose (REFRESH PLUS) 0.5 % SOLN Place 1 drop into both eyes 3 (three) times daily as needed (dry eyes).   Yes Historical Provider, MD  cholecalciferol (VITAMIN D) 1000 UNITS tablet Take 1,000 Units by mouth daily.   Yes Historical Provider, MD  enalapril (VASOTEC) 20 MG tablet Take 1 tablet (20 mg total) by mouth daily. 11/18/11  Yes [name], MD  folic acid (FOLVITE) 1 MG tablet Take 1 mg by mouth daily.   Yes Historical Provider, MD  omeprazole (PRILOSEC) 20 MG capsule Take 20 mg by mouth daily.   Yes Historical Provider, MD  allopurinol (ZYLOPRIM) 300 MG tablet Take 1 tablet (300 mg total) by mouth daily. 11/18/11 11/17/12  [name], MD  simvastatin (ZOCOR) 20 MG tablet Take 1 tablet (20 mg total) by mouth daily. 11/18/11 11/17/12  [name], MD   Physical Exam: Filed Vitals:   01/22/15 1900 01/22/15 1915 01/22/15 1930 01/22/15 2010  BP: 145/76 157/69 164/74 179/91  Pulse: 54 53 50 83  Temp:    98 F (36.7 C)  TempSrc:    Oral  Resp: 12 17 14 19   Weight:    84.2 kg (185  lb 10 oz)  SpO2: 98% 99% 100% 94%     General:  Awake and alert, NAD.   Eyes: PERRL, EOMI, conjunctiva are pink  ENT: no nasal drainage, mucous membranes are moist, tongue is midline  Neck: Supple  Cardiovascular: NR/RR  Respiratory: CTA bilaterally  Abdomen: soft NT/ND, bowel sounds are present  Skin: Warm and dry  Musculoskeletal: Moves all four extremities spontaneously  Psychiatric: Mood and affect appropriate  Neurologic: CN grossly intact though speech remains slurred.  Strength symmetric bilaterally.  Labs on Admission:  Basic Metabolic Panel:  Recent Labs Lab 01/22/15 1757 01/22/15 1809  NA 138 143  K 3.7  3.7  CL 108 109  CO2 20*  --   GLUCOSE 118* 118*  BUN 22* 27*  CREATININE 1.46* 1.50*  CALCIUM 9.0  --    Liver Function Tests:  Recent Labs Lab 01/22/15 1757  AST 24  ALT 16*  ALKPHOS 84  BILITOT 0.4  PROT 7.4  ALBUMIN 3.3*   CBC:  Recent Labs Lab 01/22/15 1757 01/22/15 1809  WBC 8.3  --   NEUTROABS 4.3  --   HGB 13.6 15.0  HCT 39.5 44.0  MCV 84.6  --   PLT 244  --    Radiological Exams on Admission: Ct Head Wo Contrast  01/22/2015   CLINICAL DATA:  code stroke  Slurred speech and left-sided facial droop.  EXAM: CT HEAD WITHOUT CONTRAST  TECHNIQUE: Contiguous axial images were obtained from the base of the skull through the vertex without intravenous contrast.  COMPARISON:  Brain MRI and head CT, 11/16/2011.  FINDINGS: Ventricles are normal configuration. There is ventricular and sulcal enlargement reflecting moderate atrophy, advanced for age. No hydrocephalus.  There are no parenchymal masses or mass effect. There is no evidence of a recent cortical infarct.  Patchy white matter hypoattenuation is noted consistent with chronic microvascular ischemic change. There are few more well-defined small focal hypo attenuating areas in the white matter consistent with old lacune infarcts. There is a small old left anterior frontal lobe cortical infarct.  There are no extra-axial masses or abnormal fluid collections.  There is no intracranial hemorrhage.  Mucosal thickening fills several middle and posterior left ethmoid air cells, similar to the prior CT. Mastoid air cells are clear. No skull lesion.  IMPRESSION: 1. No acute intracranial abnormalities. No evidence of a recent cortical infarct and no intracranial hemorrhage. 2. Moderate atrophy, mild chronic microvascular ischemic change and several old deep white matter lacune infarcts as well as a small old left frontal lobe cortical infarct. These results were called by telephone at the time of interpretation on 01/22/2015 at 6:16 pm to  Dr. Nicole Kindred, who verbally acknowledged these results.   Electronically Signed   By: Lajean Manes M.D.   On: 01/22/2015 18:17    EKG: Independently reviewed.  Incomplete LBBB noted.  No acute ST segment changes.  Inverted T waves.  Assessment/Plan Active Problems:   Stroke   CVA (cerebral infarction)   1. Admit to telemetry  2.   Acute CVA --neurology consult greatly appreciated --MRI/MRA brain without contrast ordered --Carotid ultrasound ordered --Complete echo ordered --A1c and lipid profile ordered for risk factor stratification --Full strength aspirin (patient previously on baby aspirin) pending further recommendations from neurology --Continue statin --PT/OT/Speech evaluations per protocol  3.  Acute kidney injury --Hold ACE-I for now --Gentle IV fluids --Repeat BMP in AM  4.  HTN --BP acceptable for now in the setting of acute CVA --Continue  amlodipine, atenolol for now  5.  History of DM --A1c pending --Point of care glucose monitoring AC/HS, aspart sliding scale coverage for now  6.  Active tobacco use --Smoking cessation counseling provided  Code Status: FULL  Family Communication: Brother at bedside  Disposition Plan: At least two midnights    Time spent: 70 minutes  [employer] [facility]  01/22/2015, 9:01 PM

## 2015-01-23 ENCOUNTER — Inpatient Hospital Stay (HOSPITAL_COMMUNITY): Payer: Non-veteran care

## 2015-01-23 DIAGNOSIS — I10 Essential (primary) hypertension: Secondary | ICD-10-CM

## 2015-01-23 DIAGNOSIS — I639 Cerebral infarction, unspecified: Secondary | ICD-10-CM

## 2015-01-23 DIAGNOSIS — Z720 Tobacco use: Secondary | ICD-10-CM

## 2015-01-23 DIAGNOSIS — E785 Hyperlipidemia, unspecified: Secondary | ICD-10-CM

## 2015-01-23 DIAGNOSIS — I63511 Cerebral infarction due to unspecified occlusion or stenosis of right middle cerebral artery: Secondary | ICD-10-CM | POA: Insufficient documentation

## 2015-01-23 DIAGNOSIS — Z8673 Personal history of transient ischemic attack (TIA), and cerebral infarction without residual deficits: Secondary | ICD-10-CM

## 2015-01-23 LAB — CBC
HCT: 38.2 % — ABNORMAL LOW (ref 39.0–52.0)
HEMOGLOBIN: 12.7 g/dL — AB (ref 13.0–17.0)
MCH: 28 pg (ref 26.0–34.0)
MCHC: 33.2 g/dL (ref 30.0–36.0)
MCV: 84.1 fL (ref 78.0–100.0)
Platelets: 235 10*3/uL (ref 150–400)
RBC: 4.54 MIL/uL (ref 4.22–5.81)
RDW: 14.9 % (ref 11.5–15.5)
WBC: 6.1 10*3/uL (ref 4.0–10.5)

## 2015-01-23 LAB — BASIC METABOLIC PANEL
Anion gap: 9 (ref 5–15)
BUN: 15 mg/dL (ref 6–20)
CHLORIDE: 107 mmol/L (ref 101–111)
CO2: 22 mmol/L (ref 22–32)
Calcium: 8.9 mg/dL (ref 8.9–10.3)
Creatinine, Ser: 1.19 mg/dL (ref 0.61–1.24)
GFR calc non Af Amer: >60 mL/min (ref >60)
Glucose, Bld: 155 mg/dL — ABNORMAL HIGH (ref 65–99)
POTASSIUM: 3.5 mmol/L (ref 3.5–5.1)
SODIUM: 138 mmol/L (ref 135–145)

## 2015-01-23 LAB — LIPID PANEL
CHOL/HDL RATIO: 5.7 ratio
CHOLESTEROL: 233 mg/dL — AB (ref 0–200)
HDL: 41 mg/dL (ref >40)
LDL Cholesterol: 180 mg/dL — ABNORMAL HIGH (ref 0–99)
TRIGLYCERIDES: 62 mg/dL (ref <150)
VLDL: 12 mg/dL (ref 0–40)

## 2015-01-23 LAB — GLUCOSE, CAPILLARY
Glucose-Capillary: 100 mg/dL — ABNORMAL HIGH (ref 65–99)
Glucose-Capillary: 112 mg/dL — ABNORMAL HIGH (ref 65–99)
Glucose-Capillary: 120 mg/dL — ABNORMAL HIGH (ref 65–99)

## 2015-01-23 MED ORDER — CLOPIDOGREL BISULFATE 75 MG PO TABS
75.0000 mg | ORAL_TABLET | Freq: Every day | ORAL | Status: DC
  Administered 2015-01-23 – 2015-01-26 (×4): 75 mg via ORAL
  Filled 2015-01-23 (×4): qty 1

## 2015-01-23 MED ORDER — ASPIRIN EC 81 MG PO TBEC: 81.0000 mg | DELAYED_RELEASE_TABLET | Freq: Every day | ORAL | Status: DC

## 2015-01-23 MED ORDER — ASPIRIN EC 81 MG PO TBEC
81.0000 mg | DELAYED_RELEASE_TABLET | Freq: Every day | ORAL | Status: DC
  Administered 2015-01-24 – 2015-01-26 (×3): 81 mg via ORAL
  Filled 2015-01-23 (×3): qty 1

## 2015-01-23 MED ORDER — SIMVASTATIN 40 MG PO TABS
40.0000 mg | ORAL_TABLET | Freq: Every day | ORAL | Status: DC
  Administered 2015-01-24: 40 mg via ORAL
  Filled 2015-01-23: qty 1

## 2015-01-23 NOTE — Progress Notes (Signed)
STROKE TEAM PROGRESS NOTE  HPI Sean Collins is an 64 y.o. male history of previous stroke, hypertension and hyperlipidemia presenting with new onset left facial droop and slurred speech with onset at 1600 today. No dose of left upper normal lower extremity area there were no sensory changes involving left extremities normal left side of the face. Patient has not been on antiplatelets therapy on a daily basis. CT scan of his head showed no acute changes. NIH stroke score was 2.  LSN: 1600 on 01/22/2015 tPA Given: No: Minimal deficits mRankin:  SUBJECTIVE (INTERVAL HISTORY) No family members present. The patient states that he had a stroke 4 years ago and was supposed to be on antiplatelets therapy; however, he admits that he quit taking his aspirin some time ago. He is followed at the [facility]. He continues to have dysarthria.   OBJECTIVE Temp:  [97.5 F (36.4 C)-98.4 F (36.9 C)] 97.5 F (36.4 C) (09/17 1346) Pulse Rate:  [47-83] 50 (09/17 1346) Cardiac Rhythm:  [-] Sinus bradycardia (09/17 0726) Resp:  [10-22] 16 (09/17 1346) BP: (138-179)/(69-91) 152/76 mmHg (09/17 1346) SpO2:  [94 %-100 %] 99 % (09/17 1346) Weight:  [84.2 kg (185 lb 10 oz)] 84.2 kg (185 lb 10 oz) (09/16 2010)  CBC:   Recent Labs Lab 01/22/15 1757 01/22/15 1809 01/23/15 0932  WBC 8.3  --  6.1  NEUTROABS 4.3  --   --   HGB 13.6 15.0 12.7*  HCT 39.5 44.0 38.2*  MCV 84.6  --  84.1  PLT 244  --  [id number]    Basic Metabolic Panel:   Recent Labs Lab 01/22/15 1757 01/22/15 1809 01/23/15 0932  NA 138 143 138  K 3.7 3.7 3.5  CL 108 109 107  CO2 20*  --  22  GLUCOSE 118* 118* 155*  BUN 22* 27* 15  CREATININE 1.46* 1.50* 1.19  CALCIUM 9.0  --  8.9    Lipid Panel:     Component Value Date/Time   CHOL 233* 01/23/2015 0932   TRIG 62 01/23/2015 0932   HDL 41 01/23/2015 0932   CHOLHDL 5.7 01/23/2015 0932   VLDL 12 01/23/2015 0932   LDLCALC 180* 01/23/2015 0932   HgbA1c:  Lab Results   Component Value Date   HGBA1C 6.8* 11/17/2011   Urine Drug Screen:     Component Value Date/Time   LABOPIA NONE DETECTED 11/16/2011 1641   COCAINSCRNUR NONE DETECTED 11/16/2011 1641   LABBENZ NONE DETECTED 11/16/2011 1641   AMPHETMU NONE DETECTED 11/16/2011 1641   THCU NONE DETECTED 11/16/2011 1641   LABBARB NONE DETECTED 11/16/2011 1641      IMAGING  Ct Head Wo Contrast 01/22/2015    1. No acute intracranial abnormalities. No evidence of a recent cortical infarct and no intracranial hemorrhage.  2. Moderate atrophy, mild chronic microvascular ischemic change and several old deep white matter lacune infarcts as well as a small old left frontal lobe cortical infarct.   Mr Brain Wo Contrast 01/23/2015    MRI HEAD:  Multiple acute tiny RIGHT posterior frontal lobe infarcts, MCA distribution.  Moderate to severe white matter small vessel ischemic disease, advanced for age though, similar. Old bilateral basal ganglia hemorrhagic lacunar infarcts.  LEFT frontal encephalomalacia is likely posttraumatic.   MRA HEAD:  No acute large vessel occlusion.  High-grade stenosis RIGHT M2 origin and distal RIGHT M2 segment.  High-grade stenosis distal RIGHT P2 segment. Moderate luminal irregularity of the intracranial vessels compatible with atherosclerosis.  2D echo pending  CUS pending   PHYSICAL EXAM  Temp:  [97.5 F (36.4 C)-98.4 F (36.9 C)] 97.5 F (36.4 C) (09/17 1346) Pulse Rate:  [47-83] 50 (09/17 1346) Resp:  [10-22] 16 (09/17 1346) BP: (138-179)/(69-91) 152/76 mmHg (09/17 1346) SpO2:  [94 %-100 %] 99 % (09/17 1346) Weight:  [185 lb 10 oz (84.2 kg)] 185 lb 10 oz (84.2 kg) (09/16 2010)  General - Well nourished, well developed, in no apparent distress.  Ophthalmologic - Fundi not visualized due to noncooperation.  Cardiovascular - Regular rate and rhythm with no murmur.  Mental Status -  Level of arousal and orientation to time, place, and person were intact. Language  including expression, naming, repetition, comprehension was assessed and found intact. Moderate dysarthria Fund of Knowledge was assessed and was intact.  Cranial Nerves II - XII - II - Visual field intact OU. III, IV, VI - Extraocular movements intact. V - Facial sensation intact bilaterally. VII - Facial movement intact bilaterally. VIII - Hearing & vestibular intact bilaterally. X - Palate elevates symmetrically, moderate dysarthria. XI - Chin turning & shoulder shrug intact bilaterally. XII - Tongue protrusion intact.  Motor Strength - The patient's strength was normal in all extremities and pronator drift was absent.  Bulk was normal and fasciculations were absent.   Motor Tone - Muscle tone was assessed at the neck and appendages and was normal.  Reflexes - The patient's reflexes were 1+ in all extremities and he had no pathological reflexes.  Sensory - Light touch, temperature/pinprick were assessed and were symmetrical.    Coordination - The patient had normal movements in the hands and feet with no ataxia or dysmetria.  Tremor was absent.  Gait and Station - deferred due to safety concerns.   ASSESSMENT/PLAN [name] is a 64 y.o. male with history of a previous stroke in 2013, hypertension, hyperlipidemia, ongoing tobacco use, and gout presenting with dysarthria. He did not receive IV t-PA due to minimal deficits.  Stroke:  Right frontal WM punctate infarcts, likely due to large vessel athero due to right M2 high grade stenosis in the setting of diffuse intracranial atherosclerosis.  Resultant dysarthria  MRI  Multiple acute tiny RIGHT posterior frontal lobe infarcts, MCA distribution.  MRA  Diffuse intracranial athero with right M2 high grade stenosis  Carotid Doppler  pending  2D Echo pending  LDL 180  HgbA1c pending  VTE prophylaxis - Lovenox Diet heart healthy/carb modified Room service appropriate?: Yes; Fluid consistency:: Thin  aspirin 81  mg orally every day prior to admission, now on aspirin 81 mg orally every day and clopidogrel 75 mg orally every day. Due to diffuse intracranial atherosclerosis, will recommend dural antiplatelet for 3 months and then plavix alone.   Patient counseled to be compliant with his antithrombotic medications  Ongoing aggressive stroke risk factor management  Therapy recommendations: No further physical therapy recommended  Disposition:  Pending  Hypertension  Blood pressures tend to run mildly high  Permissive hypertension (OK if < 220/120) but gradually normalize in 5-7 days  Hyperlipidemia  Home meds:  Zocor 20 mg daily resumed in hospital  LDL 180, goal < 70  Increase Zocor to 40 mg daily - encourage compliance  Continue statin at discharge  Diabetes  HgbA1c pending, goal < 7.0  Controlled  Tobacco abuse  Current smoker  Smoking cessation counseling provided  Pt is willing to quit  Other Stroke Risk Factors  Advanced age  Hx stroke/TIA  Other Active  Problems  Medical noncompliance  Gout on allopurinol  Follow up with [facility] neurology  Hospital day # 1  Mikey Bussing PA-C [facility] Pager [phone] 01/23/2015, 3:16 PM  64 yo M with hx of HTN, HLD, DM, MI s/p multiple stent, stroke in 2013 (left CR), current smoker and gout admitted for right frontal WM subcortical punctate infarcts, right MCA distribution, consistent with right M2 high grade stenosis in the setting of diffuse intracranial athero. Put on dural antiplatelet and up his zocor. Echo and CUS pending. Follow up with [facility] in [place], MD PhD Stroke Neurology 01/23/2015 5:24 PM     To contact Stroke Continuity provider, please refer to [email or web address] After hours, contact General Neurology

## 2015-01-23 NOTE — Evaluation (Signed)
Speech Language Pathology Evaluation Patient Details Name: Sean Collins MRN: [id number] DOB: 07-Jul-1950 Today's Date: 01/23/2015 Time: [id number] SLP Time Calculation (min) (ACUTE ONLY): 30 min  Problem List:  Patient Active Problem List   Diagnosis Date Noted  . Stroke 01/22/2015  . CVA (cerebral infarction) 01/22/2015  . Speech or language deficit, post-stroke 11/17/2011  . Hypertension 11/16/2011  . Tobacco abuse 11/16/2011  . Hyperlipidemia 11/16/2011  . Diabetes mellitus 11/16/2011  . Gout 11/16/2011  . History of stroke 11/16/2011  . CAD (coronary artery disease) 11/16/2011  . Acute ischemic stroke 11/16/2011   Past Medical History:  Past Medical History  Diagnosis Date  . Hypertension   . Hyperlipidemia   . Stroke    Past Surgical History: History reviewed. No pertinent past surgical history. HPI:  64 y.o. gentleman with a history of prior CVA, HTN, and active tobacco use who was in his baseline state of health until approximately 4PM on 01/22/15. While pt was at work, he developed the acute onset of slurred speech and word finding difficulty. He reports that his lips "felt heavy". He denies associated headache or vision disturbance. No syncope. No weakness in his extremities. No falls. He reported to the ED for further evaluation. CT of his head, without contrast, does not show acute abnormality, but his symptoms are concerning for stroke. He has been evaluated by neurology (Dr. Nicole Collins) in the ED; hospitalist asked to admit. 01/23/15 MRI of head indicated Multiple acute tiny RIGHT posterior frontal lobe infarcts, MCA distribution; old basal ganglia infarcts  Assessment / Plan / Recommendation Clinical Impression   Pt exhibited mild flaccid dysarthria characterized by speech being 70-90% intelligible within complex conversational tasks, but improved with compensatory strategies for increasing volume/intensity and over-articulating/pausing between phrases;  pre-morbid deficits noted with speech, but pt stated his "voice is lower and speech isn't as clear." He also stated "it takes me longer to get my words out" resulting in deliberate speech intermittently throughout conversation. Cognition appears [facility] and auditory comprehension appears WFL as well.  Pt did not have glasses available, so reading/graphic expression were not assessed.      SLP Assessment  Patient needs continued Speech Language Pathology Services    Follow Up Recommendations    TBD   Frequency and Duration min 2x/week  2 weeks   Pertinent Vitals/Pain Pain Assessment: No/denies pain   SLP Goals  Patient/Family Stated Goal: return home Potential to Achieve Goals (ACUTE ONLY): Good Potential Considerations (ACUTE ONLY): Previous level of function  SLP Evaluation Prior Functioning  Cognitive/Linguistic Baseline: Within functional limits Type of Home: Apartment  Lives With:  (caregiver per pt) Available Help at Discharge: Personal care attendant;Available 24 hours/day Vocation:  (semi-retired security guard)   Cognition  Overall Cognitive Status: Within Functional Limits for tasks assessed Arousal/Alertness: Awake/alert Orientation Level: Oriented X4 Safety/Judgment: Appears intact    Comprehension  Auditory Comprehension Overall Auditory Comprehension: Appears within functional limits for tasks assessed Yes/No Questions: Within Functional Limits Commands: Within Functional Limits Conversation: Complex Interfering Components: Processing speed EffectiveTechniques: Extra processing time Visual Recognition/Discrimination Discrimination: Not tested Reading Comprehension Reading Status: Unable to assess (comment) (no glasses available)    Expression Expression Primary Mode of Expression: Verbal Verbal Expression Overall Verbal Expression: Appears within functional limits for tasks assessed Initiation: No impairment Level of Generative/Spontaneous Verbalization:  Conversation Repetition: No impairment Naming: No impairment Pragmatics: No impairment Non-Verbal Means of Communication: Not applicable Written Expression Written Expression: Not tested   Oral / Motor Oral Motor/Sensory Function Overall  Oral Motor/Sensory Function: Appears within functional limits for tasks assessed Facial Symmetry: Within Functional Limits Motor Speech Overall Motor Speech: Appears within functional limits for tasks assessed Respiration: Within functional limits Phonation: Low vocal intensity Resonance: Within functional limits Articulation: Impaired Level of Impairment: Sentence Intelligibility: Intelligibility reduced Word: 75-100% accurate Phrase: 75-100% accurate Sentence: 75-100% accurate Conversation: 50-74% accurate Motor Planning: Witnin functional limits Motor Speech Errors: Not applicable Interfering Components: Premorbid status Effective Techniques: Slow rate;Increased vocal intensity;Over-articulate        ADAMS,PAT, M.S., CCC-SLP 01/23/2015, 11:07 AM

## 2015-01-23 NOTE — Progress Notes (Signed)
[facility] PROGRESS NOTE  Sean Collins [id number] DOB: March 18, 1951 DOA: 01/22/2015 PCP: Default, Provider, MD  Assessment/Plan: 1. Acute CVA 1. Multiple R MCA territory infarct noted on MRI, ,likely secondary to larve vessel athero from R M2 high grade stenosis 2. Neurology following.  3. Recs for 3 months of dual antiplatelet then plavix alone 4. Zocor increased to 40mg  5. 2d echo and CUS pending 2. ARF 1. Renal function improved 3. HTN 1. BP stable 2. Cont monitor 4. DM2 1. Glucose stable 2. Cont SSI coverage 5. Tobacco abuse 1. Cessation done 6. DVT prophylaxis 1. Lovenox subq  Code Status: Full Family Communication: Pt in room (indicate person spoken with, relationship, and if by phone, the number) Disposition Plan: Pending   Consultants:  Neurology  Procedures:    Antibiotics:   (indicate start date, and stop date if known)  HPI/Subjective: States feeling better  Objective: Filed Vitals:   01/23/15 0302 01/23/15 0612 01/23/15 1346 01/23/15 1824  BP: 140/83 154/87 152/76 150/66  Pulse: 59 47 50 51  Temp:  98.3 F (36.8 C) 97.5 F (36.4 C) 98.3 F (36.8 C)  TempSrc:  Oral Oral Oral  Resp: 20 20 16 16   Height:  5' 8.5" (1.74 m)    Weight:      SpO2: 98% 95% 99% 100%    Intake/Output Summary (Last 24 hours) at 01/23/15 1837 Last data filed at 01/23/15 1401  Gross per 24 hour  Intake    700 ml  Output   1000 ml  Net   -300 ml   Filed Weights   01/22/15 2010  Weight: 84.2 kg (185 lb 10 oz)    Exam:   General:  Awake, in nad  Cardiovascular: regular, s1, s2  Respiratory: normal resp effot, no wheezing  Abdomen: soft, nondistended  Musculoskeletal: perfused, no clubbing   Data Reviewed: Basic Metabolic Panel:  Recent Labs Lab 01/22/15 1757 01/22/15 1809 01/23/15 0932  NA 138 143 138  K 3.7 3.7 3.5  CL 108 109 107  CO2 20*  --  22  GLUCOSE 118* 118* 155*  BUN 22* 27* 15  CREATININE 1.46* 1.50* 1.19   CALCIUM 9.0  --  8.9   Liver Function Tests:  Recent Labs Lab 01/22/15 1757  AST 24  ALT 16*  ALKPHOS 84  BILITOT 0.4  PROT 7.4  ALBUMIN 3.3*   No results for input(s): LIPASE, AMYLASE in the last 168 hours. No results for input(s): AMMONIA in the last 168 hours. CBC:  Recent Labs Lab 01/22/15 1757 01/22/15 1809 01/23/15 0932  WBC 8.3  --  6.1  NEUTROABS 4.3  --   --   HGB 13.6 15.0 12.7*  HCT 39.5 44.0 38.2*  MCV 84.6  --  84.1  PLT 244  --  235   Cardiac Enzymes: No results for input(s): CKTOTAL, CKMB, CKMBINDEX, TROPONINI in the last 168 hours. BNP (last 3 results) No results for input(s): BNP in the last 8760 hours.  ProBNP (last 3 results) No results for input(s): PROBNP in the last 8760 hours.  CBG:  Recent Labs Lab 01/22/15 2341 01/23/15 0745 01/23/15 1154 01/23/15 1650  GLUCAP 134* 100* 112* 120*    No results found for this or any previous visit (from the past 240 hour(s)).   Studies: Ct Head Wo Contrast  01/22/2015   CLINICAL DATA:  code stroke  Slurred speech and left-sided facial droop.  EXAM: CT HEAD WITHOUT CONTRAST  TECHNIQUE: Contiguous axial images were obtained  from the base of the skull through the vertex without intravenous contrast.  COMPARISON:  Brain MRI and head CT, 11/16/2011.  FINDINGS: Ventricles are normal configuration. There is ventricular and sulcal enlargement reflecting moderate atrophy, advanced for age. No hydrocephalus.  There are no parenchymal masses or mass effect. There is no evidence of a recent cortical infarct.  Patchy white matter hypoattenuation is noted consistent with chronic microvascular ischemic change. There are few more well-defined small focal hypo attenuating areas in the white matter consistent with old lacune infarcts. There is a small old left anterior frontal lobe cortical infarct.  There are no extra-axial masses or abnormal fluid collections.  There is no intracranial hemorrhage.  Mucosal thickening  fills several middle and posterior left ethmoid air cells, similar to the prior CT. Mastoid air cells are clear. No skull lesion.  IMPRESSION: 1. No acute intracranial abnormalities. No evidence of a recent cortical infarct and no intracranial hemorrhage. 2. Moderate atrophy, mild chronic microvascular ischemic change and several old deep white matter lacune infarcts as well as a small old left frontal lobe cortical infarct. These results were called by telephone at the time of interpretation on 01/22/2015 at 6:16 pm to Dr. Nicole Kindred, who verbally acknowledged these results.   Electronically Signed   By: Lajean Manes M.D.   On: 01/22/2015 18:17   Mr Brain Wo Contrast  01/23/2015   CLINICAL DATA:  Acute onset slurred speech and word-finding difficulty at work today, 4 p.m. History of stroke, hypertension, nicotine abuse.  EXAM: MRI HEAD WITHOUT CONTRAST  MRA HEAD WITHOUT CONTRAST  TECHNIQUE: Multiplanar, multiecho pulse sequences of the brain and surrounding structures were obtained without intravenous contrast. Angiographic images of the head were obtained using MRA technique without contrast.  COMPARISON:  CT head January 22, 2015 MRI of the brain November 16, 2011  FINDINGS: MRI HEAD FINDINGS  LEFT frontal convexity encephalomalacia associated with faint susceptibility artifact. Faint susceptibility artifact within bilateral basal ganglia hemorrhagic lacunar infarcts. Patchy to confluent supratentorial white matter T2 hyperintensities. Small LEFT cerebellar infarct. Moderate to severe ventriculomegaly, advanced for age and, on the basis of global parenchymal brain volume loss.  No abnormal extra-axial fluid collections. Major intracranial vascular flow voids seen at the skull base, with dolichoectasia. Ocular globes and orbital contents are unremarkable. Chronic LEFT ethmoid sinusitis. Small LEFT maxillary mucosal retention cyst. The mastoid air cells are well aerated. No abnormal sellar expansion. No cerebellar  tonsillar ectopia. No suspicious calvarial bone marrow signal.  MRA HEAD FINDINGS  Anterior circulation: Normal flow related enhancement of the included cervical, petrous, cavernous and supra clinoid internal carotid arteries. Mild luminal irregularity of the cavernous carotid arteries corresponding CT calcific atherosclerosis. Patent anterior communicating artery. Tortuous RIGHT A1 segment. Flow related enhancement of the anterior and middle cerebral arteries, including more distal segments. Focal high-grade stenosis origin of RIGHT M2 segment. High-grade stenosis distal RIGHT M2 segment.  No large vessel occlusion, high-grade stenosis, aneurysm. Moderate luminal irregularity.  Posterior circulation: RIGHT vertebral artery is dominant. Basilar artery is patent, with normal flow related enhancement of the main branch vessels. Moderate stenosis proximal LEFT P2 segment, with moderate distal luminal irregularity. Mild stenosis RIGHT proximal P2 segment. High-grade stenosis RIGHT distal P2 segment.  No large vessel occlusion, aneurysm.  IMPRESSION: MRI HEAD: Multiple acute tiny RIGHT posterior frontal lobe infarcts, MCA distribution.  Moderate to severe white matter small vessel ischemic disease, advanced for age though, similar. Old bilateral basal ganglia hemorrhagic lacunar infarcts.  LEFT frontal encephalomalacia is  likely posttraumatic.  MRA HEAD: No acute large vessel occlusion.  High-grade stenosis RIGHT M2 origin and distal RIGHT M2 segment.  High-grade stenosis distal RIGHT P2 segment. Moderate luminal irregularity of the intracranial vessels compatible with atherosclerosis.   Electronically Signed   By: Elon Alas M.D.   On: 01/23/2015 06:01   Mr Jodene Nam Head/brain Wo Cm  01/23/2015   CLINICAL DATA:  Acute onset slurred speech and word-finding difficulty at work today, 4 p.m. History of stroke, hypertension, nicotine abuse.  EXAM: MRI HEAD WITHOUT CONTRAST  MRA HEAD WITHOUT CONTRAST  TECHNIQUE:  Multiplanar, multiecho pulse sequences of the brain and surrounding structures were obtained without intravenous contrast. Angiographic images of the head were obtained using MRA technique without contrast.  COMPARISON:  CT head January 22, 2015 MRI of the brain November 16, 2011  FINDINGS: MRI HEAD FINDINGS  LEFT frontal convexity encephalomalacia associated with faint susceptibility artifact. Faint susceptibility artifact within bilateral basal ganglia hemorrhagic lacunar infarcts. Patchy to confluent supratentorial white matter T2 hyperintensities. Small LEFT cerebellar infarct. Moderate to severe ventriculomegaly, advanced for age and, on the basis of global parenchymal brain volume loss.  No abnormal extra-axial fluid collections. Major intracranial vascular flow voids seen at the skull base, with dolichoectasia. Ocular globes and orbital contents are unremarkable. Chronic LEFT ethmoid sinusitis. Small LEFT maxillary mucosal retention cyst. The mastoid air cells are well aerated. No abnormal sellar expansion. No cerebellar tonsillar ectopia. No suspicious calvarial bone marrow signal.  MRA HEAD FINDINGS  Anterior circulation: Normal flow related enhancement of the included cervical, petrous, cavernous and supra clinoid internal carotid arteries. Mild luminal irregularity of the cavernous carotid arteries corresponding CT calcific atherosclerosis. Patent anterior communicating artery. Tortuous RIGHT A1 segment. Flow related enhancement of the anterior and middle cerebral arteries, including more distal segments. Focal high-grade stenosis origin of RIGHT M2 segment. High-grade stenosis distal RIGHT M2 segment.  No large vessel occlusion, high-grade stenosis, aneurysm. Moderate luminal irregularity.  Posterior circulation: RIGHT vertebral artery is dominant. Basilar artery is patent, with normal flow related enhancement of the main branch vessels. Moderate stenosis proximal LEFT P2 segment, with moderate distal  luminal irregularity. Mild stenosis RIGHT proximal P2 segment. High-grade stenosis RIGHT distal P2 segment.  No large vessel occlusion, aneurysm.  IMPRESSION: MRI HEAD: Multiple acute tiny RIGHT posterior frontal lobe infarcts, MCA distribution.  Moderate to severe white matter small vessel ischemic disease, advanced for age though, similar. Old bilateral basal ganglia hemorrhagic lacunar infarcts.  LEFT frontal encephalomalacia is likely posttraumatic.  MRA HEAD: No acute large vessel occlusion.  High-grade stenosis RIGHT M2 origin and distal RIGHT M2 segment.  High-grade stenosis distal RIGHT P2 segment. Moderate luminal irregularity of the intracranial vessels compatible with atherosclerosis.   Electronically Signed   By: Elon Alas M.D.   On: 01/23/2015 06:01    Scheduled Meds: . allopurinol  300 mg Oral Daily  . amLODipine  10 mg Oral Daily  . [START ON 01/24/2015] aspirin EC  81 mg Oral Daily  . atenolol  50 mg Oral BID  . cholecalciferol  1,000 Units Oral Daily  . clopidogrel  75 mg Oral Daily  . enoxaparin (LOVENOX) injection  30 mg Subcutaneous Q24H  . folic acid  1 mg Oral Daily  . insulin aspart  0-5 Units Subcutaneous QHS  . insulin aspart  0-9 Units Subcutaneous TID WC  . pantoprazole  40 mg Oral Daily  . [START ON 01/24/2015] simvastatin  40 mg Oral Daily   Continuous Infusions: . sodium  chloride 100 mL/hr at 01/22/15 2335    Active Problems:   Stroke   CVA (cerebral infarction)    [facility] Pager [phone]. If 7PM-7AM, please contact night-coverage at [email or web address], password [email or web address] 01/23/2015, 6:37 PM  LOS: 1 day

## 2015-01-23 NOTE — Evaluation (Signed)
Physical Therapy Evaluation Patient Details Name: Sean Collins MRN: [id number] DOB: 09/19/1950 Today's Date: 01/23/2015   History of Present Illness  Pt adm with slurred speech and word finding difficulties. MRI - Multiple acute tiny RIGHT posterior frontal lobe infarcts. PMH - CVA, DM, gout  Clinical Impression  Pt doing well with mobility and no further PT needed.  Ready for dc from PT standpoint.      Follow Up Recommendations No PT follow up    Equipment Recommendations  None recommended by PT    Recommendations for Other Services       Precautions / Restrictions Precautions Precautions: None      Mobility  Bed Mobility Overal bed mobility: Independent                Transfers Overall transfer level: Independent                  Ambulation/Gait Ambulation/Gait assistance: Independent Ambulation Distance (Feet): 600 Feet Assistive device: None Gait Pattern/deviations: WFL(Within Functional Limits)   Gait velocity interpretation: at or above normal speed for age/gender    Stairs            Wheelchair Mobility    Modified Rankin (Stroke Patients Only) Modified Rankin (Stroke Patients Only) Pre-Morbid Rankin Score: No symptoms Modified Rankin: No symptoms     Balance Overall balance assessment: No apparent balance deficits (not formally assessed)                                           Pertinent Vitals/Pain Pain Assessment: No/denies pain    Home Living Family/patient expects to be discharged to:: Private residence     Type of Home: Apartment Home Access: Level entry     Home Layout: One level Home Equipment: None      Prior Function Level of Independence: Independent         Comments: Works     Journalist, newspaper        Extremity/Trunk Assessment   Upper Extremity Assessment: Overall WFL for tasks assessed           Lower Extremity Assessment: Overall WFL for tasks assessed         Communication      Cognition Arousal/Alertness: Awake/alert Behavior During Therapy: WFL for tasks assessed/performed Overall Cognitive Status: Within Functional Limits for tasks assessed                      General Comments      Exercises        Assessment/Plan    PT Assessment Patent does not need any further PT services  PT Diagnosis Other (comment)   PT Problem List    PT Treatment Interventions     PT Goals (Current goals can be found in the Care Plan section) Acute Rehab PT Goals PT Goal Formulation: All assessment and education complete, DC therapy    Frequency     Barriers to discharge        Co-evaluation               End of Session   Activity Tolerance: Patient tolerated treatment well Patient left: in bed;with call bell/phone within reach Nurse Communication: Mobility status         Time: 1012-1021 PT Time Calculation (min) (ACUTE ONLY): 9 min   Charges:   PT Evaluation $  Initial PT Evaluation Tier I: 1 Procedure     PT G Codes:        Shawnika Pepin 26-Jan-2015, 10:34 AM Suanne Marker PT [phone]

## 2015-01-24 DIAGNOSIS — Z91199 Patient's noncompliance with other medical treatment and regimen due to unspecified reason: Secondary | ICD-10-CM | POA: Insufficient documentation

## 2015-01-24 DIAGNOSIS — Z9119 Patient's noncompliance with other medical treatment and regimen: Secondary | ICD-10-CM

## 2015-01-24 LAB — GLUCOSE, CAPILLARY
GLUCOSE-CAPILLARY: 99 mg/dL (ref 65–99)
GLUCOSE-CAPILLARY: 148 mg/dL — AB (ref 65–99)
GLUCOSE-CAPILLARY: 103 mg/dL — AB (ref 65–99)
GLUCOSE-CAPILLARY: 131 mg/dL — AB (ref 65–99)
Glucose-Capillary: 97 mg/dL (ref 65–99)

## 2015-01-24 MED ORDER — ATORVASTATIN CALCIUM 20 MG PO TABS
20.0000 mg | ORAL_TABLET | Freq: Every day | ORAL | Status: DC
  Administered 2015-01-25: 20 mg via ORAL
  Filled 2015-01-24: qty 1

## 2015-01-24 NOTE — Progress Notes (Signed)
STROKE TEAM PROGRESS NOTE  HPI Sean Collins is an 64 y.o. male history of previous stroke, hypertension and hyperlipidemia presenting with new onset left facial droop and slurred speech with onset at 1600 today. No dose of left upper normal lower extremity area there were no sensory changes involving left extremities normal left side of the face. Patient has not been on antiplatelets therapy on a daily basis. CT scan of his head showed no acute changes. NIH stroke score was 2.  LSN: 1600 on 01/22/2015 tPA Given: No: Minimal deficits mRankin:  SUBJECTIVE (INTERVAL HISTORY) No family members present. The patient states that he had a stroke 4 years ago and was supposed to be on antiplatelets therapy; however, he admits that he quit taking his aspirin some time ago. He is followed at the [facility]. He continues to have dysarthria.   OBJECTIVE Temp:  [97.5 F (36.4 C)-98.3 F (36.8 C)] 97.8 F (36.6 C) (09/18 0439) Pulse Rate:  [50-62] 52 (09/18 0439) Cardiac Rhythm:  [-] Sinus bradycardia (09/18 0712) Resp:  [16-18] 18 (09/18 0439) BP: (150-162)/(66-83) 154/83 mmHg (09/18 0439) SpO2:  [98 %-100 %] 98 % (09/18 0439)  CBC:   Recent Labs Lab 01/22/15 1757 01/22/15 1809 01/23/15 0932  WBC 8.3  --  6.1  NEUTROABS 4.3  --   --   HGB 13.6 15.0 12.7*  HCT 39.5 44.0 38.2*  MCV 84.6  --  84.1  PLT 244  --  [id number]    Basic Metabolic Panel:   Recent Labs Lab 01/22/15 1757 01/22/15 1809 01/23/15 0932  NA 138 143 138  K 3.7 3.7 3.5  CL 108 109 107  CO2 20*  --  22  GLUCOSE 118* 118* 155*  BUN 22* 27* 15  CREATININE 1.46* 1.50* 1.19  CALCIUM 9.0  --  8.9    Lipid Panel:     Component Value Date/Time   CHOL 233* 01/23/2015 0932   TRIG 62 01/23/2015 0932   HDL 41 01/23/2015 0932   CHOLHDL 5.7 01/23/2015 0932   VLDL 12 01/23/2015 0932   LDLCALC 180* 01/23/2015 0932   HgbA1c:  Lab Results  Component Value Date   HGBA1C 6.8* 11/17/2011   Urine Drug Screen:      Component Value Date/Time   LABOPIA NONE DETECTED 11/16/2011 1641   COCAINSCRNUR NONE DETECTED 11/16/2011 1641   LABBENZ NONE DETECTED 11/16/2011 1641   AMPHETMU NONE DETECTED 11/16/2011 1641   THCU NONE DETECTED 11/16/2011 1641   LABBARB NONE DETECTED 11/16/2011 1641      IMAGING  Ct Head Wo Contrast 01/22/2015    1. No acute intracranial abnormalities. No evidence of a recent cortical infarct and no intracranial hemorrhage.  2. Moderate atrophy, mild chronic microvascular ischemic change and several old deep white matter lacune infarcts as well as a small old left frontal lobe cortical infarct.   Mr Brain Wo Contrast 01/23/2015    MRI HEAD:  Multiple acute tiny RIGHT posterior frontal lobe infarcts, MCA distribution.  Moderate to severe white matter small vessel ischemic disease, advanced for age though, similar. Old bilateral basal ganglia hemorrhagic lacunar infarcts.  LEFT frontal encephalomalacia is likely posttraumatic.   MRA HEAD:  No acute large vessel occlusion.  High-grade stenosis RIGHT M2 origin and distal RIGHT M2 segment.  High-grade stenosis distal RIGHT P2 segment. Moderate luminal irregularity of the intracranial vessels compatible with atherosclerosis.     2D echo pending  CUS pending   PHYSICAL EXAM  Temp:  [97.5 F (  36.4 C)-98.3 F (36.8 C)] 97.8 F (36.6 C) (09/18 0439) Pulse Rate:  [50-62] 52 (09/18 0439) Resp:  [16-18] 18 (09/18 0439) BP: (150-162)/(66-83) 154/83 mmHg (09/18 0439) SpO2:  [98 %-100 %] 98 % (09/18 0439)  General - Well nourished, well developed, in no apparent distress.  Ophthalmologic - Fundi not visualized due to noncooperation.  Cardiovascular - Regular rate and rhythm with no murmur.  Mental Status -  Level of arousal and orientation to time, place, and person were intact. Language including expression, naming, repetition, comprehension was assessed and found intact. Moderate dysarthria Fund of Knowledge was assessed and was  intact.  Cranial Nerves II - XII - II - Visual field intact OU. III, IV, VI - Extraocular movements intact. V - Facial sensation intact bilaterally. VII - Facial movement intact bilaterally. VIII - Hearing & vestibular intact bilaterally. X - Palate elevates symmetrically, moderate dysarthria. XI - Chin turning & shoulder shrug intact bilaterally. XII - Tongue protrusion intact.  Motor Strength - The patient's strength was normal in all extremities and pronator drift was absent.  Bulk was normal and fasciculations were absent.   Motor Tone - Muscle tone was assessed at the neck and appendages and was normal.  Reflexes - The patient's reflexes were 1+ in all extremities and he had no pathological reflexes.  Sensory - Light touch, temperature/pinprick were assessed and were symmetrical.    Coordination - The patient had normal movements in the hands and feet with no ataxia or dysmetria.  Tremor was absent.  Gait and Station - deferred due to safety concerns.   ASSESSMENT/PLAN [name] is a 64 y.o. male with history of a previous stroke in 2013, hypertension, hyperlipidemia, ongoing tobacco use, and gout presenting with dysarthria. He did not receive IV t-PA due to minimal deficits.  Stroke:  Right frontal WM punctate infarcts, likely due to large vessel athero due to right M2 high grade stenosis in the setting of diffuse intracranial atherosclerosis.  Resultant dysarthria  MRI  Multiple acute tiny RIGHT posterior frontal lobe infarcts, MCA distribution.  MRA  Diffuse intracranial athero with right M2 high grade stenosis  Carotid Doppler  pending  2D Echo pending  LDL 180  HgbA1c pending  VTE prophylaxis - Lovenox Diet heart healthy/carb modified Room service appropriate?: Yes; Fluid consistency:: Thin  aspirin 81 mg orally every day prior to admission, now on aspirin 81 mg orally every day and clopidogrel 75 mg orally every day. Due to diffuse intracranial  atherosclerosis, will recommend dural antiplatelet for 3 months and then plavix alone.   Patient counseled to be compliant with his antithrombotic medications  Ongoing aggressive stroke risk factor management  Therapy recommendations: No further physical therapy recommended  Disposition:  Pending  Hypertension  Blood pressures tend to run mildly high  Permissive hypertension (OK if < 220/120) but gradually normalize in 5-7 days  Hyperlipidemia  Home meds:  Zocor 20 mg daily resumed in hospital  LDL 180, goal < 70  Increase Zocor to 40 mg daily - encourage compliance  Continue statin at discharge  Diabetes  HgbA1c pending, goal < 7.0  Controlled  Tobacco abuse  Current smoker  Smoking cessation counseling provided  Pt is willing to quit  Other Stroke Risk Factors  Advanced age  Hx stroke/TIA  Other Active Problems  Medical noncompliance  Gout on allopurinol  Follow up with [facility] neurology  Hospital day # 2  Mikey Bussing [facility] Pager [phone] 01/24/2015, 10:30  AM  64 yo M with hx of HTN, HLD, DM, MI s/p multiple stent, stroke in 2013 (left CR), current smoker and gout admitted for right frontal WM subcortical punctate infarcts, right MCA distribution, consistent with right M2 high grade stenosis in the setting of diffuse intracranial athero. Put on dual antiplatelet and up his zocor. Echo and CUS pending. Follow up with [place] [facility] in [place] Ren, MD Stroke Neurology 01/24/2015 10:30 AM     To contact Stroke Continuity provider, please refer to [email or web address] After hours, contact General Neurology

## 2015-01-24 NOTE — Progress Notes (Signed)
[facility] PROGRESS NOTE  Sean Collins [id number] DOB: [birth year] DOA: 01/22/2015 PCP: Stephanye Finnicum, MD  Assessment/Plan: 1. Acute CVA 1. Multiple R MCA territory infarct noted on MRI, ,likely secondary to larve vessel athero from R M2 high grade stenosis 2. Neurology following.  3. Recs for 3 months of dual antiplatelet then plavix alone 4. Zocor was increased to 40mg  - discussed with pharmacy. given risk of rhabdomyolysis, have changed to lipitor 20mg  5. 2d echo and CUS still pending 2. ARF 1. Renal function improved 3. HTN 1. BP stable 2. Cont monitor 4. DM2 1. Glucose stable 2. Cont SSI coverage 5. Tobacco abuse 1. Cessation done 6. DVT prophylaxis 1. Lovenox subq  Code Status: Full Family Communication: Pt in room  Disposition Plan: Pending   Consultants:  Neurology  Procedures:    Antibiotics:   (indicate start date, and stop date if known)  HPI/Subjective: No complaints. Eager to go home  Objective: Filed Vitals:   01/23/15 1957 01/23/15 2300 01/24/15 0439 01/24/15 1503  BP: 155/76 162/70 154/83 151/81  Pulse: 52 62 52 53  Temp: 97.8 F (36.6 C) 97.6 F (36.4 C) 97.8 F (36.6 C) 98.6 F (37 C)  TempSrc: Oral Oral Oral   Resp: 18 18 18 16   Height:      Weight:      SpO2: 100% 100% 98% 100%    Intake/Output Summary (Last 24 hours) at 01/24/15 1508 Last data filed at 01/24/15 0826  Gross per 24 hour  Intake      0 ml  Output    325 ml  Net   -325 ml   Filed Weights   01/22/15 2010  Weight: 84.2 kg (185 lb 10 oz)    Exam:   General:  Awake, laying in bed, in nad  Cardiovascular: regular, s1, s2  Respiratory: normal resp effot, no wheezing  Abdomen: soft, nondistended  Musculoskeletal: perfused, no clubbing, no cyanosis  Data Reviewed: Basic Metabolic Panel:  Recent Labs Lab 01/22/15 1757 01/22/15 1809 01/23/15 0932  NA 138 143 138  K 3.7 3.7 3.5  CL 108 109 107  CO2 20*  --  22  GLUCOSE 118*  118* 155*  BUN 22* 27* 15  CREATININE 1.46* 1.50* 1.19  CALCIUM 9.0  --  8.9   Liver Function Tests:  Recent Labs Lab 01/22/15 1757  AST 24  ALT 16*  ALKPHOS 84  BILITOT 0.4  PROT 7.4  ALBUMIN 3.3*   No results for input(s): LIPASE, AMYLASE in the last 168 hours. No results for input(s): AMMONIA in the last 168 hours. CBC:  Recent Labs Lab 01/22/15 1757 01/22/15 1809 01/23/15 0932  WBC 8.3  --  6.1  NEUTROABS 4.3  --   --   HGB 13.6 15.0 12.7*  HCT 39.5 44.0 38.2*  MCV 84.6  --  84.1  PLT 244  --  235   Cardiac Enzymes: No results for input(s): CKTOTAL, CKMB, CKMBINDEX, TROPONINI in the last 168 hours. BNP (last 3 results) No results for input(s): BNP in the last 8760 hours.  ProBNP (last 3 results) No results for input(s): PROBNP in the last 8760 hours.  CBG:  Recent Labs Lab 01/23/15 1154 01/23/15 1650 01/23/15 2324 01/24/15 0748 01/24/15 1146  GLUCAP 112* 120* 99 97 148*    No results found for this or any previous visit (from the past 240 hour(s)).   Studies: Ct Head Wo Contrast  01/22/2015   CLINICAL DATA:  code stroke  Slurred  speech and left-sided facial droop.  EXAM: CT HEAD WITHOUT CONTRAST  TECHNIQUE: Contiguous axial images were obtained from the base of the skull through the vertex without intravenous contrast.  COMPARISON:  Brain MRI and head CT, 11/16/2011.  FINDINGS: Ventricles are normal configuration. There is ventricular and sulcal enlargement reflecting moderate atrophy, advanced for age. No hydrocephalus.  There are no parenchymal masses or mass effect. There is no evidence of a recent cortical infarct.  Patchy white matter hypoattenuation is noted consistent with chronic microvascular ischemic change. There are few more well-defined small focal hypo attenuating areas in the white matter consistent with old lacune infarcts. There is a small old left anterior frontal lobe cortical infarct.  There are no extra-axial masses or abnormal fluid  collections.  There is no intracranial hemorrhage.  Mucosal thickening fills several middle and posterior left ethmoid air cells, similar to the prior CT. Mastoid air cells are clear. No skull lesion.  IMPRESSION: 1. No acute intracranial abnormalities. No evidence of a recent cortical infarct and no intracranial hemorrhage. 2. Moderate atrophy, mild chronic microvascular ischemic change and several old deep white matter lacune infarcts as well as a small old left frontal lobe cortical infarct. These results were called by telephone at the time of interpretation on 01/22/2015 at 6:16 pm to Dr. Nicole Kindred, who verbally acknowledged these results.   Electronically Signed   By: Lajean Manes M.D.   On: 01/22/2015 18:17   Mr Brain Wo Contrast  01/23/2015   CLINICAL DATA:  Acute onset slurred speech and word-finding difficulty at work today, 4 p.m. History of stroke, hypertension, nicotine abuse.  EXAM: MRI HEAD WITHOUT CONTRAST  MRA HEAD WITHOUT CONTRAST  TECHNIQUE: Multiplanar, multiecho pulse sequences of the brain and surrounding structures were obtained without intravenous contrast. Angiographic images of the head were obtained using MRA technique without contrast.  COMPARISON:  CT head January 22, 2015 MRI of the brain November 16, 2011  FINDINGS: MRI HEAD FINDINGS  LEFT frontal convexity encephalomalacia associated with faint susceptibility artifact. Faint susceptibility artifact within bilateral basal ganglia hemorrhagic lacunar infarcts. Patchy to confluent supratentorial white matter T2 hyperintensities. Small LEFT cerebellar infarct. Moderate to severe ventriculomegaly, advanced for age and, on the basis of global parenchymal brain volume loss.  No abnormal extra-axial fluid collections. Major intracranial vascular flow voids seen at the skull base, with dolichoectasia. Ocular globes and orbital contents are unremarkable. Chronic LEFT ethmoid sinusitis. Small LEFT maxillary mucosal retention cyst. The mastoid air  cells are well aerated. No abnormal sellar expansion. No cerebellar tonsillar ectopia. No suspicious calvarial bone marrow signal.  MRA HEAD FINDINGS  Anterior circulation: Normal flow related enhancement of the included cervical, petrous, cavernous and supra clinoid internal carotid arteries. Mild luminal irregularity of the cavernous carotid arteries corresponding CT calcific atherosclerosis. Patent anterior communicating artery. Tortuous RIGHT A1 segment. Flow related enhancement of the anterior and middle cerebral arteries, including more distal segments. Focal high-grade stenosis origin of RIGHT M2 segment. High-grade stenosis distal RIGHT M2 segment.  No large vessel occlusion, high-grade stenosis, aneurysm. Moderate luminal irregularity.  Posterior circulation: RIGHT vertebral artery is dominant. Basilar artery is patent, with normal flow related enhancement of the main branch vessels. Moderate stenosis proximal LEFT P2 segment, with moderate distal luminal irregularity. Mild stenosis RIGHT proximal P2 segment. High-grade stenosis RIGHT distal P2 segment.  No large vessel occlusion, aneurysm.  IMPRESSION: MRI HEAD: Multiple acute tiny RIGHT posterior frontal lobe infarcts, MCA distribution.  Moderate to severe white matter small vessel ischemic  disease, advanced for age though, similar. Old bilateral basal ganglia hemorrhagic lacunar infarcts.  LEFT frontal encephalomalacia is likely posttraumatic.  MRA HEAD: No acute large vessel occlusion.  High-grade stenosis RIGHT M2 origin and distal RIGHT M2 segment.  High-grade stenosis distal RIGHT P2 segment. Moderate luminal irregularity of the intracranial vessels compatible with atherosclerosis.   Electronically Signed   By: Elon Alas M.D.   On: 01/23/2015 06:01   Mr Jodene Nam Head/brain Wo Cm  01/23/2015   CLINICAL DATA:  Acute onset slurred speech and word-finding difficulty at work today, 4 p.m. History of stroke, hypertension, nicotine abuse.  EXAM: MRI  HEAD WITHOUT CONTRAST  MRA HEAD WITHOUT CONTRAST  TECHNIQUE: Multiplanar, multiecho pulse sequences of the brain and surrounding structures were obtained without intravenous contrast. Angiographic images of the head were obtained using MRA technique without contrast.  COMPARISON:  CT head January 22, 2015 MRI of the brain November 16, 2011  FINDINGS: MRI HEAD FINDINGS  LEFT frontal convexity encephalomalacia associated with faint susceptibility artifact. Faint susceptibility artifact within bilateral basal ganglia hemorrhagic lacunar infarcts. Patchy to confluent supratentorial white matter T2 hyperintensities. Small LEFT cerebellar infarct. Moderate to severe ventriculomegaly, advanced for age and, on the basis of global parenchymal brain volume loss.  No abnormal extra-axial fluid collections. Major intracranial vascular flow voids seen at the skull base, with dolichoectasia. Ocular globes and orbital contents are unremarkable. Chronic LEFT ethmoid sinusitis. Small LEFT maxillary mucosal retention cyst. The mastoid air cells are well aerated. No abnormal sellar expansion. No cerebellar tonsillar ectopia. No suspicious calvarial bone marrow signal.  MRA HEAD FINDINGS  Anterior circulation: Normal flow related enhancement of the included cervical, petrous, cavernous and supra clinoid internal carotid arteries. Mild luminal irregularity of the cavernous carotid arteries corresponding CT calcific atherosclerosis. Patent anterior communicating artery. Tortuous RIGHT A1 segment. Flow related enhancement of the anterior and middle cerebral arteries, including more distal segments. Focal high-grade stenosis origin of RIGHT M2 segment. High-grade stenosis distal RIGHT M2 segment.  No large vessel occlusion, high-grade stenosis, aneurysm. Moderate luminal irregularity.  Posterior circulation: RIGHT vertebral artery is dominant. Basilar artery is patent, with normal flow related enhancement of the main branch vessels. Moderate  stenosis proximal LEFT P2 segment, with moderate distal luminal irregularity. Mild stenosis RIGHT proximal P2 segment. High-grade stenosis RIGHT distal P2 segment.  No large vessel occlusion, aneurysm.  IMPRESSION: MRI HEAD: Multiple acute tiny RIGHT posterior frontal lobe infarcts, MCA distribution.  Moderate to severe white matter small vessel ischemic disease, advanced for age though, similar. Old bilateral basal ganglia hemorrhagic lacunar infarcts.  LEFT frontal encephalomalacia is likely posttraumatic.  MRA HEAD: No acute large vessel occlusion.  High-grade stenosis RIGHT M2 origin and distal RIGHT M2 segment.  High-grade stenosis distal RIGHT P2 segment. Moderate luminal irregularity of the intracranial vessels compatible with atherosclerosis.   Electronically Signed   By: Elon Alas M.D.   On: 01/23/2015 06:01    Scheduled Meds: . allopurinol  300 mg Oral Daily  . amLODipine  10 mg Oral Daily  . aspirin EC  81 mg Oral Daily  . atenolol  50 mg Oral BID  . [START ON 01/25/2015] atorvastatin  20 mg Oral q1800  . cholecalciferol  1,000 Units Oral Daily  . clopidogrel  75 mg Oral Daily  . enoxaparin (LOVENOX) injection  30 mg Subcutaneous Q24H  . folic acid  1 mg Oral Daily  . insulin aspart  0-5 Units Subcutaneous QHS  . insulin aspart  0-9 Units Subcutaneous TID  WC  . pantoprazole  40 mg Oral Daily   Continuous Infusions: . sodium chloride 100 mL/hr at 01/23/15 2247    Active Problems:   Stroke   CVA (cerebral infarction)   Acute right MCA stroke    CHIU, STEPHEN K  [facility] Pager [phone]. If 7PM-7AM, please contact night-coverage at [email or web address], password [facility] 01/24/2015, 3:08 PM  LOS: 2 days

## 2015-01-25 ENCOUNTER — Ambulatory Visit (HOSPITAL_COMMUNITY): Payer: Non-veteran care

## 2015-01-25 ENCOUNTER — Inpatient Hospital Stay (HOSPITAL_COMMUNITY): Payer: Non-veteran care

## 2015-01-25 DIAGNOSIS — I639 Cerebral infarction, unspecified: Secondary | ICD-10-CM

## 2015-01-25 DIAGNOSIS — I6789 Other cerebrovascular disease: Secondary | ICD-10-CM

## 2015-01-25 LAB — GLUCOSE, CAPILLARY
GLUCOSE-CAPILLARY: 116 mg/dL — AB (ref 65–99)
GLUCOSE-CAPILLARY: 139 mg/dL — AB (ref 65–99)
Glucose-Capillary: 81 mg/dL (ref 65–99)
Glucose-Capillary: 116 mg/dL — ABNORMAL HIGH (ref 65–99)

## 2015-01-25 LAB — HEMOGLOBIN A1C
Hgb A1c MFr Bld: 6.3 % — ABNORMAL HIGH (ref 4.8–5.6)
Mean Plasma Glucose: 134 mg/dL

## 2015-01-25 MED ORDER — ATORVASTATIN CALCIUM 20 MG PO TABS: 20.0000 mg | ORAL_TABLET | Freq: Every day | ORAL | Status: DC

## 2015-01-25 MED ORDER — ENOXAPARIN SODIUM 40 MG/0.4ML ~~LOC~~ SOLN
40.0000 mg | SUBCUTANEOUS | Status: DC
  Administered 2015-01-25: 40 mg via SUBCUTANEOUS
  Filled 2015-01-25: qty 0.4

## 2015-01-25 MED ORDER — CLOPIDOGREL BISULFATE 75 MG PO TABS: 75.0000 mg | ORAL_TABLET | Freq: Every day | ORAL | Status: DC

## 2015-01-25 NOTE — Evaluation (Signed)
Occupational Therapy Evaluation Patient Details Name: Sean Collins MRN: [id number] DOB: 19-Jan-1951 Today's Date: 01/25/2015    History of Present Illness Pt adm with slurred speech and word finding difficulties. MRI - Multiple acute tiny RIGHT posterior frontal lobe infarcts. PMH - CVA, DM, gout   Clinical Impression   Pt is performing at baseline in ADL, ADL transfers and cognition. Continues to have some dysarthria.  No OT needs.    Follow Up Recommendations  No OT follow up    Equipment Recommendations  None recommended by OT    Recommendations for Other Services       Precautions / Restrictions Precautions Precautions: None      Mobility Bed Mobility Overal bed mobility: Independent                Transfers Overall transfer level: Independent                    Balance                                            ADL Overall ADL's : Independent                                       General ADL Comments: demonstrated toilet and tub transfer, grooming and simulated bathing and dressing     Vision     Perception     Praxis      Pertinent Vitals/Pain Pain Assessment: No/denies pain     Hand Dominance Right   Extremity/Trunk Assessment Upper Extremity Assessment Upper Extremity Assessment: Overall WFL for tasks assessed   Lower Extremity Assessment Lower Extremity Assessment: Overall WFL for tasks assessed       Communication Communication Communication: Expressive difficulties   Cognition Arousal/Alertness: Awake/alert Behavior During Therapy: WFL for tasks assessed/performed Overall Cognitive Status: Within Functional Limits for tasks assessed                     General Comments       Exercises       Shoulder Instructions      Home Living Family/patient expects to be discharged to:: Private residence Living Arrangements: Alone Available Help at Discharge:  Friend(s);Available PRN/intermittently Type of Home: House Home Access: Level entry     Home Layout: One level     Bathroom Shower/Tub: Teacher, early years/pre: Standard     Home Equipment: None      Lives With: Alone    Prior Functioning/Environment Level of Independence: Independent        Comments: work as a Production assistant, radio care    OT Diagnosis:     OT Problem List:     OT Treatment/Interventions:      OT Goals(Current goals can be found in the care plan section)    OT Frequency:     Barriers to D/C:            Co-evaluation              End of Session Nurse Communication:  (pt not on chair alarm)  Activity Tolerance: Patient tolerated treatment well Patient left: in chair;with call bell/phone within reach   Time: 1134-1155 OT Time Calculation (min): 21 min Charges:  OT General  Charges $OT Visit: 1 Procedure OT Evaluation $Initial OT Evaluation Tier I: 1 Procedure G-Codes:    Malka So 01/25/2015, 11:59 AM  [phone]

## 2015-01-25 NOTE — Progress Notes (Signed)
STROKE TEAM PROGRESS NOTE  HPI Sean Collins is an 64 y.o. male history of previous stroke, hypertension and hyperlipidemia presenting with new onset left facial droop and slurred speech with onset at 1600 today. No dose of left upper normal lower extremity area there were no sensory changes involving left extremities normal left side of the face. Patient has not been on antiplatelets therapy on a daily basis. CT scan of his head showed no acute changes. NIH stroke score was 2.  LSN: 1600 on 01/22/2015 tPA Given: No: Minimal deficits mRankin:  SUBJECTIVE (INTERVAL HISTORY) No family members present. He has no complaints. Carotid ultrasound showed no extra cranial stenosis.. Transthoracic echo has been done but results are awaited   OBJECTIVE Temp:  [98.1 F (36.7 C)-98.6 F (37 C)] 98.1 F (36.7 C) (09/19 1444) Pulse Rate:  [51-65] 55 (09/19 1444) Cardiac Rhythm:  [-] Normal sinus rhythm (09/19 0702) Resp:  [16-18] 16 (09/19 1444) BP: (133-157)/(66-71) 133/66 mmHg (09/19 1444) SpO2:  [98 %-100 %] 100 % (09/19 1444)  CBC:   Recent Labs Lab 01/22/15 1757 01/22/15 1809 01/23/15 0932  WBC 8.3  --  6.1  NEUTROABS 4.3  --   --   HGB 13.6 15.0 12.7*  HCT 39.5 44.0 38.2*  MCV 84.6  --  84.1  PLT 244  --  [id number]    Basic Metabolic Panel:   Recent Labs Lab 01/22/15 1757 01/22/15 1809 01/23/15 0932  NA 138 143 138  K 3.7 3.7 3.5  CL 108 109 107  CO2 20*  --  22  GLUCOSE 118* 118* 155*  BUN 22* 27* 15  CREATININE 1.46* 1.50* 1.19  CALCIUM 9.0  --  8.9    Lipid Panel:     Component Value Date/Time   CHOL 233* 01/23/2015 0932   TRIG 62 01/23/2015 0932   HDL 41 01/23/2015 0932   CHOLHDL 5.7 01/23/2015 0932   VLDL 12 01/23/2015 0932   LDLCALC 180* 01/23/2015 0932   HgbA1c:  Lab Results  Component Value Date   HGBA1C 6.3* 01/23/2015   Urine Drug Screen:     Component Value Date/Time   LABOPIA NONE DETECTED 11/16/2011 1641   COCAINSCRNUR NONE DETECTED  11/16/2011 1641   LABBENZ NONE DETECTED 11/16/2011 1641   AMPHETMU NONE DETECTED 11/16/2011 1641   THCU NONE DETECTED 11/16/2011 1641   LABBARB NONE DETECTED 11/16/2011 1641      IMAGING  Ct Head Wo Contrast 01/22/2015    1. No acute intracranial abnormalities. No evidence of a recent cortical infarct and no intracranial hemorrhage.  2. Moderate atrophy, mild chronic microvascular ischemic change and several old deep white matter lacune infarcts as well as a small old left frontal lobe cortical infarct.   Mr Brain Wo Contrast 01/23/2015    MRI HEAD:  Multiple acute tiny RIGHT posterior frontal lobe infarcts, MCA distribution.  Moderate to severe white matter small vessel ischemic disease, advanced for age though, similar. Old bilateral basal ganglia hemorrhagic lacunar infarcts.  LEFT frontal encephalomalacia is likely posttraumatic.   MRA HEAD:  No acute large vessel occlusion.  High-grade stenosis RIGHT M2 origin and distal RIGHT M2 segment.  High-grade stenosis distal RIGHT P2 segment. Moderate luminal irregularity of the intracranial vessels compatible with atherosclerosis.     2D echo results pending  CUS 1-39% ICA stenosis. Vertebral artery flow is antegrade.     PHYSICAL EXAM  Temp:  [98.1 F (36.7 C)-98.6 F (37 C)] 98.1 F (36.7 C) (09/19 1444) Pulse  Rate:  [51-65] 55 (09/19 1444) Resp:  [16-18] 16 (09/19 1444) BP: (133-157)/(66-71) 133/66 mmHg (09/19 1444) SpO2:  [98 %-100 %] 100 % (09/19 1444)  General - Well nourished, well developed, in no apparent distress.  Ophthalmologic - Fundi not visualized due to noncooperation.  Cardiovascular - Regular rate and rhythm with no murmur.  Mental Status -  Level of arousal and orientation to time, place, and person were intact. Language including expression, naming, repetition, comprehension was assessed and found intact. Mild dysarthria Fund of Knowledge was assessed and was intact.  Cranial Nerves II - XII - II -  Visual field intact OU. III, IV, VI - Extraocular movements intact. V - Facial sensation intact bilaterally. VII - Facial movement intact bilaterally. VIII - Hearing & vestibular intact bilaterally. X - Palate elevates symmetrically,   XI - Chin turning & shoulder shrug intact bilaterally. XII - Tongue protrusion intact.  Motor Strength - The patient's strength was normal in all extremities and pronator drift was absent.  Bulk was normal and fasciculations were absent.   Motor Tone - Muscle tone was assessed at the neck and appendages and was normal.  Reflexes - The patient's reflexes were 1+ in all extremities and he had no pathological reflexes.  Sensory - Light touch, temperature/pinprick were assessed and were symmetrical.    Coordination - The patient had normal movements in the hands and feet with no ataxia or dysmetria.  Tremor was absent.  Gait and Station - deferred due to safety concerns.   ASSESSMENT/PLAN [name] is a 64 y.o. male with history of a previous stroke in 2013, hypertension, hyperlipidemia, ongoing tobacco use, and gout presenting with dysarthria. He did not receive IV t-PA due to minimal deficits.  Stroke:  Right frontal WM punctate infarcts, likely due to large vessel athero due to right M2 high grade stenosis in the setting of diffuse intracranial atherosclerosis.  Resultant dysarthria  MRI  Multiple acute tiny RIGHT posterior frontal lobe infarcts, MCA distribution.  MRA  Diffuse intracranial athero with right M2 high grade stenosis  Carotid Doppler  pending  2D Echo pending  LDL 180  HgbA1c 6.3  VTE prophylaxis - Lovenox Diet heart healthy/carb modified Room service appropriate?: Yes; Fluid consistency:: Thin  aspirin 81 mg orally every day prior to admission, now on aspirin 81 mg orally every day and clopidogrel 75 mg orally every day. Due to diffuse intracranial atherosclerosis, will recommend dual antiplatelet for 3 months and  then plavix alone.   Patient counseled to be compliant with his antithrombotic medications  Ongoing aggressive stroke risk factor management  Therapy recommendations: No further physical therapy recommended  Disposition:  Pending  Hypertension  Blood pressures tend to run mildly high  Permissive hypertension (OK if < 220/120) but gradually normalize in 5-7 days  Hyperlipidemia  Home meds:  Zocor 20 mg daily resumed in hospital  LDL 180, goal < 70  Increase Zocor to 40 mg daily - encourage compliance  Continue statin at discharge  Diabetes  HgbA1c 6.3, goal < 7.0  Controlled  Tobacco abuse  Current smoker  Smoking cessation counseling provided  Pt is willing to quit  Other Stroke Risk Factors  Advanced age  Hx stroke/TIA  Other Active Problems  Medical noncompliance  Gout on allopurinol  [facility] up with [facility] neurology  Hospital day # 3      64 yo M with hx of HTN, HLD, DM, MI s/p multiple stent, stroke in 2013 (left CR), current  smoker and gout admitted for right frontal WM subcortical punctate infarcts, right MCA distribution, consistent with right M2 high grade stenosis in the setting of diffuse intracranial athero. Put on dual antiplatelet and up his zocor. Echo   pending. Follow up with [place] neurology in [place], MD Stroke Neurology 01/25/2015 4:33 PM     To contact Stroke Continuity provider, please refer to [email or web address] After hours, contact General Neurology

## 2015-01-25 NOTE — Progress Notes (Signed)
Per Neoma Laming CM, patient will be discharged tomorrow due to Department of [facility] delay.

## 2015-01-25 NOTE — Progress Notes (Addendum)
Per Case Manager Neoma Laming patient will be able to be dc'd later this afternoon.

## 2015-01-25 NOTE — Discharge Summary (Signed)
Physician Discharge Summary  Sean Collins [id number] DOB: [birth year] DOA: 01/22/2015  PCP: Default, Provider, MD  Admit date: 01/22/2015 Discharge date: 01/26/2015  Time spent: 20 minutes  Recommendations for Outpatient Follow-up:  1. Follow up with PCP in 1-2 weeks 2. Pt instructed to follow up with primary Neurologist at [facility]  Discharge Diagnoses:  Active Problems:   Stroke   CVA (cerebral infarction)   Acute right MCA stroke   Noncompliance   Discharge Condition: Stable  Diet recommendation: Heart Healthy  Filed Weights   01/22/15 2010  Weight: 84.2 kg (185 lb 10 oz)    History of present illness:  Please review h and p from 9/16 for details. Briefly, pt presented with slurred speech and word finding difficulties. Patient was admitted for further work up.  Hospital Course:  1. Acute CVA 1. Multiple R MCA territory infarct noted on MRI, ,likely secondary to larve vessel athero from R M2 high grade stenosis 2. Neurology was following.  3. Recs for 3 months of dual antiplatelet (aspirin and plavix) then plavix alone given atheroslerotic burden 4. Zocor was increased to 40mg  - had discussed with pharmacy. given risk of rhabdomyolysis, had changed to lipitor 20mg  5. CUS without occlusion 6. 2d echo with EF 55-60% with normal wall motion with mod TR 7. Discussed case with Neurology. Pt is cleared for discharge with close follow up with pt's primary Neurologist at [facility]. Pt knows this 2. ARF 1. Renal function improved 3. HTN 1. BP stable 2. Cont monitor 4. DM2 1. Glucose stable 2. Cont SSI coverage 5. Tobacco abuse 1. Cessation done 6. DVT prophylaxis 1. Lovenox subq  Consultations:  Neurology/Stroke Team  Discharge Exam: Filed Vitals:   01/25/15 2153 01/26/15 0018 01/26/15 0432 01/26/15 0457  BP: 139/57 158/81 179/87 137/60  Pulse: 53 49 53 46  Temp: 98 F (36.7 C) 98.2 F (36.8 C) 97.6 F (36.4 C)   TempSrc: Oral Oral Oral   Resp: 18 18 18     Height:      Weight:      SpO2: 99% 100% 98%     General: Awake, in nad Cardiovascular: regular, s1, s2 Respiratory: normal resp effort, no wheezing  Discharge Instructions     Medication List    STOP taking these medications        simvastatin 20 MG tablet  Commonly known as:  ZOCOR      TAKE these medications        allopurinol 300 MG tablet  Commonly known as:  ZYLOPRIM  Take 300 mg by mouth daily.     amLODipine 10 MG tablet  Commonly known as:  NORVASC  Take 0.5 tablets (5 mg total) by mouth daily.     aspirin EC 81 MG tablet  Take 81 mg by mouth every 4 (four) hours as needed for mild pain or moderate pain.     atenolol 50 MG tablet  Commonly known as:  TENORMIN  Take 1 tablet (50 mg total) by mouth 2 (two) times daily.     atorvastatin 20 MG tablet  Commonly known as:  LIPITOR  Take 1 tablet (20 mg total) by mouth daily at 6 PM.     carboxymethylcellulose 0.5 % Soln  Commonly known as:  REFRESH PLUS  Place 1 drop into both eyes 3 (three) times daily as needed (dry eyes).     cholecalciferol 1000 UNITS tablet  Commonly known as:  VITAMIN D  Take 1,000 Units by mouth daily.  clopidogrel 75 MG tablet  Commonly known as:  PLAVIX  Take 1 tablet (75 mg total) by mouth daily.     enalapril 20 MG tablet  Commonly known as:  VASOTEC  Take 1 tablet (20 mg total) by mouth daily.     folic acid 1 MG tablet  Commonly known as:  FOLVITE  Take 1 mg by mouth daily.     omeprazole 20 MG capsule  Commonly known as:  PRILOSEC  Take 20 mg by mouth daily.       No Known Allergies Follow-up Information    Follow up with Follow up with PCP in 1-2 weeks. Schedule an appointment as soon as possible for a visit in 1 week.   Why:  Hospital follow up      Follow up with Follow up with [place] neurology in [place] In 1 month.   Why:  Hospital follow up       The results of significant diagnostics from this hospitalization (including imaging,  microbiology, ancillary and laboratory) are listed below for reference.    Significant Diagnostic Studies: Ct Head Wo Contrast  01/22/2015   CLINICAL DATA:  code stroke  Slurred speech and left-sided facial droop.  EXAM: CT HEAD WITHOUT CONTRAST  TECHNIQUE: Contiguous axial images were obtained from the base of the skull through the vertex without intravenous contrast.  COMPARISON:  Brain MRI and head CT, 11/16/2011.  FINDINGS: Ventricles are normal configuration. There is ventricular and sulcal enlargement reflecting moderate atrophy, advanced for age. No hydrocephalus.  There are no parenchymal masses or mass effect. There is no evidence of a recent cortical infarct.  Patchy white matter hypoattenuation is noted consistent with chronic microvascular ischemic change. There are few more well-defined small focal hypo attenuating areas in the white matter consistent with old lacune infarcts. There is a small old left anterior frontal lobe cortical infarct.  There are no extra-axial masses or abnormal fluid collections.  There is no intracranial hemorrhage.  Mucosal thickening fills several middle and posterior left ethmoid air cells, similar to the prior CT. Mastoid air cells are clear. No skull lesion.  IMPRESSION: 1. No acute intracranial abnormalities. No evidence of a recent cortical infarct and no intracranial hemorrhage. 2. Moderate atrophy, mild chronic microvascular ischemic change and several old deep white matter lacune infarcts as well as a small old left frontal lobe cortical infarct. These results were called by telephone at the time of interpretation on 01/22/2015 at 6:16 pm to Dr. Nicole Kindred, who verbally acknowledged these results.   Electronically Signed   By: Lajean Manes M.D.   On: 01/22/2015 18:17   Mr Brain Wo Contrast  01/23/2015   CLINICAL DATA:  Acute onset slurred speech and word-finding difficulty at work today, 4 p.m. History of stroke, hypertension, nicotine abuse.  EXAM: MRI HEAD  WITHOUT CONTRAST  MRA HEAD WITHOUT CONTRAST  TECHNIQUE: Multiplanar, multiecho pulse sequences of the brain and surrounding structures were obtained without intravenous contrast. Angiographic images of the head were obtained using MRA technique without contrast.  COMPARISON:  CT head January 22, 2015 MRI of the brain November 16, 2011  FINDINGS: MRI HEAD FINDINGS  LEFT frontal convexity encephalomalacia associated with faint susceptibility artifact. Faint susceptibility artifact within bilateral basal ganglia hemorrhagic lacunar infarcts. Patchy to confluent supratentorial white matter T2 hyperintensities. Small LEFT cerebellar infarct. Moderate to severe ventriculomegaly, advanced for age and, on the basis of global parenchymal brain volume loss.  No abnormal extra-axial fluid collections. Major intracranial vascular  flow voids seen at the skull base, with dolichoectasia. Ocular globes and orbital contents are unremarkable. Chronic LEFT ethmoid sinusitis. Small LEFT maxillary mucosal retention cyst. The mastoid air cells are well aerated. No abnormal sellar expansion. No cerebellar tonsillar ectopia. No suspicious calvarial bone marrow signal.  MRA HEAD FINDINGS  Anterior circulation: Normal flow related enhancement of the included cervical, petrous, cavernous and supra clinoid internal carotid arteries. Mild luminal irregularity of the cavernous carotid arteries corresponding CT calcific atherosclerosis. Patent anterior communicating artery. Tortuous RIGHT A1 segment. Flow related enhancement of the anterior and middle cerebral arteries, including more distal segments. Focal high-grade stenosis origin of RIGHT M2 segment. High-grade stenosis distal RIGHT M2 segment.  No large vessel occlusion, high-grade stenosis, aneurysm. Moderate luminal irregularity.  Posterior circulation: RIGHT vertebral artery is dominant. Basilar artery is patent, with normal flow related enhancement of the main branch vessels. Moderate  stenosis proximal LEFT P2 segment, with moderate distal luminal irregularity. Mild stenosis RIGHT proximal P2 segment. High-grade stenosis RIGHT distal P2 segment.  No large vessel occlusion, aneurysm.  IMPRESSION: MRI HEAD: Multiple acute tiny RIGHT posterior frontal lobe infarcts, MCA distribution.  Moderate to severe white matter small vessel ischemic disease, advanced for age though, similar. Old bilateral basal ganglia hemorrhagic lacunar infarcts.  LEFT frontal encephalomalacia is likely posttraumatic.  MRA HEAD: No acute large vessel occlusion.  High-grade stenosis RIGHT M2 origin and distal RIGHT M2 segment.  High-grade stenosis distal RIGHT P2 segment. Moderate luminal irregularity of the intracranial vessels compatible with atherosclerosis.   Electronically Signed   By: Elon Alas M.D.   On: 01/23/2015 06:01   Mr Jodene Nam Head/brain Wo Cm  01/23/2015   CLINICAL DATA:  Acute onset slurred speech and word-finding difficulty at work today, 4 p.m. History of stroke, hypertension, nicotine abuse.  EXAM: MRI HEAD WITHOUT CONTRAST  MRA HEAD WITHOUT CONTRAST  TECHNIQUE: Multiplanar, multiecho pulse sequences of the brain and surrounding structures were obtained without intravenous contrast. Angiographic images of the head were obtained using MRA technique without contrast.  COMPARISON:  CT head January 22, 2015 MRI of the brain November 16, 2011  FINDINGS: MRI HEAD FINDINGS  LEFT frontal convexity encephalomalacia associated with faint susceptibility artifact. Faint susceptibility artifact within bilateral basal ganglia hemorrhagic lacunar infarcts. Patchy to confluent supratentorial white matter T2 hyperintensities. Small LEFT cerebellar infarct. Moderate to severe ventriculomegaly, advanced for age and, on the basis of global parenchymal brain volume loss.  No abnormal extra-axial fluid collections. Major intracranial vascular flow voids seen at the skull base, with dolichoectasia. Ocular globes and orbital  contents are unremarkable. Chronic LEFT ethmoid sinusitis. Small LEFT maxillary mucosal retention cyst. The mastoid air cells are well aerated. No abnormal sellar expansion. No cerebellar tonsillar ectopia. No suspicious calvarial bone marrow signal.  MRA HEAD FINDINGS  Anterior circulation: Normal flow related enhancement of the included cervical, petrous, cavernous and supra clinoid internal carotid arteries. Mild luminal irregularity of the cavernous carotid arteries corresponding CT calcific atherosclerosis. Patent anterior communicating artery. Tortuous RIGHT A1 segment. Flow related enhancement of the anterior and middle cerebral arteries, including more distal segments. Focal high-grade stenosis origin of RIGHT M2 segment. High-grade stenosis distal RIGHT M2 segment.  No large vessel occlusion, high-grade stenosis, aneurysm. Moderate luminal irregularity.  Posterior circulation: RIGHT vertebral artery is dominant. Basilar artery is patent, with normal flow related enhancement of the main branch vessels. Moderate stenosis proximal LEFT P2 segment, with moderate distal luminal irregularity. Mild stenosis RIGHT proximal P2 segment. High-grade stenosis RIGHT distal P2  segment.  No large vessel occlusion, aneurysm.  IMPRESSION: MRI HEAD: Multiple acute tiny RIGHT posterior frontal lobe infarcts, MCA distribution.  Moderate to severe white matter small vessel ischemic disease, advanced for age though, similar. Old bilateral basal ganglia hemorrhagic lacunar infarcts.  LEFT frontal encephalomalacia is likely posttraumatic.  MRA HEAD: No acute large vessel occlusion.  High-grade stenosis RIGHT M2 origin and distal RIGHT M2 segment.  High-grade stenosis distal RIGHT P2 segment. Moderate luminal irregularity of the intracranial vessels compatible with atherosclerosis.   Electronically Signed   By: Elon Alas M.D.   On: 01/23/2015 06:01    Microbiology: No results found for this or any previous visit (from  the past 240 hour(s)).   Labs: Basic Metabolic Panel:  Recent Labs Lab 01/22/15 1757 01/22/15 1809 01/23/15 0932  NA 138 143 138  K 3.7 3.7 3.5  CL 108 109 107  CO2 20*  --  22  GLUCOSE 118* 118* 155*  BUN 22* 27* 15  CREATININE 1.46* 1.50* 1.19  CALCIUM 9.0  --  8.9   Liver Function Tests:  Recent Labs Lab 01/22/15 1757  AST 24  ALT 16*  ALKPHOS 84  BILITOT 0.4  PROT 7.4  ALBUMIN 3.3*   No results for input(s): LIPASE, AMYLASE in the last 168 hours. No results for input(s): AMMONIA in the last 168 hours. CBC:  Recent Labs Lab 01/22/15 1757 01/22/15 1809 01/23/15 0932  WBC 8.3  --  6.1  NEUTROABS 4.3  --   --   HGB 13.6 15.0 12.7*  HCT 39.5 44.0 38.2*  MCV 84.6  --  84.1  PLT 244  --  235   Cardiac Enzymes: No results for input(s): CKTOTAL, CKMB, CKMBINDEX, TROPONINI in the last 168 hours. BNP: BNP (last 3 results) No results for input(s): BNP in the last 8760 hours.  ProBNP (last 3 results) No results for input(s): PROBNP in the last 8760 hours.  CBG:  Recent Labs Lab 01/25/15 0839 01/25/15 1150 01/25/15 1648 01/25/15 2150 01/26/15 0829  GLUCAP 81 116* 116* 139* 89    Signed:  CHIU, STEPHEN K  [facility] 01/26/2015, 10:15 AM

## 2015-01-25 NOTE — Progress Notes (Signed)
[facility] PROGRESS NOTE  Sean Collins [id number] DOB: Mar 01, 1951 DOA: 01/22/2015 PCP: Default, Provider, MD  Assessment/Plan: 1. Acute CVA 1. Multiple R MCA territory infarct noted on MRI, ,likely secondary to larve vessel athero from R M2 high grade stenosis 2. Neurology was following.  3. Recs for 3 months of dual antiplatelet (aspirin and plavix) then plavix alone given atheroslerotic burden 4. Zocor was increased to 40mg  - had discussed with pharmacy. given risk of rhabdomyolysis, had changed to lipitor 20mg  5. CUS without occlusion 6. 2d echo pending 2. ARF 1. Renal function improved 3. HTN 1. BP stable 2. Cont monitor 4. DM2 1. Glucose stable 2. Cont SSI coverage 5. Tobacco abuse 1. Cessation done 6. DVT prophylaxis 1. Lovenox subq  Code Status: Full Family Communication: Pt in room  Disposition Plan: Pending   Consultants:  Neurology  Procedures:    Antibiotics:   (indicate start date, and stop date if known)  HPI/Subjective: Pt states feeling well today. Eager to go home  Objective: Filed Vitals:   01/24/15 0439 01/24/15 1503 01/24/15 2140 01/25/15 0542  BP: 154/83 151/81 155/71 157/69  Pulse: 52 53 65 51  Temp: 97.8 F (36.6 C) 98.6 F (37 C) 98.6 F (37 C) 98.2 F (36.8 C)  TempSrc: Oral  Oral Oral  Resp: 18 16 18 18   Height:      Weight:      SpO2: 98% 100% 100% 98%    Intake/Output Summary (Last 24 hours) at 01/25/15 1336 Last data filed at 01/25/15 0943  Gross per 24 hour  Intake    600 ml  Output   1550 ml  Net   -950 ml   Filed Weights   01/22/15 2010  Weight: 84.2 kg (185 lb 10 oz)    Exam:   General:  Awake, laying in bed, in nad  Cardiovascular: regular, s1, s2  Respiratory: normal resp effot, no wheezing  Abdomen: soft, nondistended  Musculoskeletal: perfused, no clubbing, no cyanosis  Data Reviewed: Basic Metabolic Panel:  Recent Labs Lab 01/22/15 1757 01/22/15 1809 01/23/15 0932   NA 138 143 138  K 3.7 3.7 3.5  CL 108 109 107  CO2 20*  --  22  GLUCOSE 118* 118* 155*  BUN 22* 27* 15  CREATININE 1.46* 1.50* 1.19  CALCIUM 9.0  --  8.9   Liver Function Tests:  Recent Labs Lab 01/22/15 1757  AST 24  ALT 16*  ALKPHOS 84  BILITOT 0.4  PROT 7.4  ALBUMIN 3.3*   No results for input(s): LIPASE, AMYLASE in the last 168 hours. No results for input(s): AMMONIA in the last 168 hours. CBC:  Recent Labs Lab 01/22/15 1757 01/22/15 1809 01/23/15 0932  WBC 8.3  --  6.1  NEUTROABS 4.3  --   --   HGB 13.6 15.0 12.7*  HCT 39.5 44.0 38.2*  MCV 84.6  --  84.1  PLT 244  --  235   Cardiac Enzymes: No results for input(s): CKTOTAL, CKMB, CKMBINDEX, TROPONINI in the last 168 hours. BNP (last 3 results) No results for input(s): BNP in the last 8760 hours.  ProBNP (last 3 results) No results for input(s): PROBNP in the last 8760 hours.  CBG:  Recent Labs Lab 01/24/15 1146 01/24/15 1648 01/24/15 2138 01/25/15 0839 01/25/15 1150  GLUCAP 148* 103* 131* 81 116*    No results found for this or any previous visit (from the past 240 hour(s)).   Studies: No results found.  Scheduled Meds: .  allopurinol  300 mg Oral Daily  . amLODipine  10 mg Oral Daily  . aspirin EC  81 mg Oral Daily  . atenolol  50 mg Oral BID  . atorvastatin  20 mg Oral q1800  . cholecalciferol  1,000 Units Oral Daily  . clopidogrel  75 mg Oral Daily  . enoxaparin (LOVENOX) injection  30 mg Subcutaneous Q24H  . folic acid  1 mg Oral Daily  . insulin aspart  0-5 Units Subcutaneous QHS  . insulin aspart  0-9 Units Subcutaneous TID WC  . pantoprazole  40 mg Oral Daily   Continuous Infusions: . sodium chloride 100 mL/hr at 01/25/15 0030    Active Problems:   Stroke   CVA (cerebral infarction)   Acute right MCA stroke   Noncompliance    [facility], [facility] Pager [phone]. If 7PM-7AM, please contact night-coverage at [email or web address], password [facility] 01/25/2015,  1:36 PM  LOS: 3 days

## 2015-01-25 NOTE — Care Management Note (Addendum)
Case Management Note  Patient Details  Name: Sean Collins MRN: [id number] Date of Birth: 11-25-1950  Subjective/Objective:      Date: 01/25/15 Spoke with patient at the bedside. Introduced self as Tourist information centre manager and explained role in discharge planning and how to be reached. Verified patient lives in town, with friend,  has DME (crutches) but does not use them. Expressed potential need for no other DME. Verified patient anticipates to go  home alone with friend, Patrick Jupiter , patient's brother will transport him home, his phone is [phone], at time of discharge and will have  part-time supervision by  friend  at this time to best of their knowledge. Patient  denied needing help with their medication  He states we have medication here that he brought hospital.  He states the [place] his meds to him.  Per pharmacy plavix is new for patient, will have to find out where he will get this medication. Patient drives  to MD appointments. Verified patient has PCP Felton Clinton.  NCM faxed discharge summary and script for lipitor and plavix to Dr. Natale Lay. Will await to hear from [place] to see if patient can pick up from [facility]  NCM spoke with Anderson Malta the pharmacist at the [facility], she states that they will have to get information to pcp, she has not seen the information put in for the pharmacy yet, she can not promise that the lipitor and the plavix will be filled. She will call me in the am to let me know if the orders came through.  Patient does not have lipitor or plavix authorized yet by [place], he will need this before discharge.  Plan: CM will continue to follow for discharge planning and [facility] resources.               Action/Plan:   Expected Discharge Date:                  Expected Discharge Plan:  [place]  In-House Referral:     Discharge planning Services  CM Consult  Post Acute Care Choice:    Choice offered to:     DME Arranged:    DME Agency:     HH Arranged:     [place] Agency:     Status of Service:  In process, will continue to follow  Medicare Important Message Given:    Date Medicare IM Given:    Medicare IM give by:    Date Additional Medicare IM Given:    Additional Medicare Important Message give by:     If discussed at [place] of Stay Meetings, dates discussed:    Additional Comments:  Zenon Mayo, RN 01/25/2015, 11:58 AM

## 2015-01-25 NOTE — Progress Notes (Signed)
  Echocardiogram 2D Echocardiogram has been performed.  Sean Collins 01/25/2015, 3:28 PM

## 2015-01-25 NOTE — Progress Notes (Deleted)
Per Case Manager Neoma Laming, patient is unable to be dc'd today, the patient is a [place] patient newly on clopridogrel and his paperwork needs to be submitted to and approved by the [place] prior to d/c.

## 2015-01-25 NOTE — Progress Notes (Signed)
VASCULAR LAB PRELIMINARY  PRELIMINARY  PRELIMINARY  PRELIMINARY  Carotid duplex completed.    Preliminary report:  1-39% ICA stenosis.  Vertebral artery flow is antegrade.   KANADY, CANDACE, RVT 01/25/2015, 8:13 AM

## 2015-01-26 LAB — GLUCOSE, CAPILLARY: Glucose-Capillary: 89 mg/dL (ref 65–99)

## 2015-01-26 NOTE — Progress Notes (Signed)
STROKE TEAM PROGRESS NOTE  HPI Sean Collins is an 64 y.o. male history of previous stroke, hypertension and hyperlipidemia presenting with new onset left facial droop and slurred speech with onset at 1600 today. No dose of left upper normal lower extremity area there were no sensory changes involving left extremities normal left side of the face. Patient has not been on antiplatelets therapy on a daily basis. CT scan of his head showed no acute changes. NIH stroke score was 2.  LSN: 1600 on 01/22/2015 tPA Given: No: Minimal deficits mRankin:  SUBJECTIVE (INTERVAL HISTORY) No family members present. He has no complaints. Carotid ultrasound showed no extra cranial stenosis.. Transthoracic echo is normal   OBJECTIVE Temp:  [97.6 F (36.4 C)-98.2 F (36.8 C)] 97.6 F (36.4 C) (09/20 0432) Pulse Rate:  [46-55] 46 (09/20 0457) Cardiac Rhythm:  [-] Sinus bradycardia (09/20 0702) Resp:  [16-18] 18 (09/20 0432) BP: (133-179)/(57-103) 172/103 mmHg (09/20 1123) SpO2:  [98 %-100 %] 98 % (09/20 0432)  CBC:   Recent Labs Lab 01/22/15 1757 01/22/15 1809 01/23/15 0932  WBC 8.3  --  6.1  NEUTROABS 4.3  --   --   HGB 13.6 15.0 12.7*  HCT 39.5 44.0 38.2*  MCV 84.6  --  84.1  PLT 244  --  [id number]    Basic Metabolic Panel:   Recent Labs Lab 01/22/15 1757 01/22/15 1809 01/23/15 0932  NA 138 143 138  K 3.7 3.7 3.5  CL 108 109 107  CO2 20*  --  22  GLUCOSE 118* 118* 155*  BUN 22* 27* 15  CREATININE 1.46* 1.50* 1.19  CALCIUM 9.0  --  8.9    Lipid Panel:     Component Value Date/Time   CHOL 233* 01/23/2015 0932   TRIG 62 01/23/2015 0932   HDL 41 01/23/2015 0932   CHOLHDL 5.7 01/23/2015 0932   VLDL 12 01/23/2015 0932   LDLCALC 180* 01/23/2015 0932   HgbA1c:  Lab Results  Component Value Date   HGBA1C 6.3* 01/23/2015   Urine Drug Screen:     Component Value Date/Time   LABOPIA NONE DETECTED 11/16/2011 1641   COCAINSCRNUR NONE DETECTED 11/16/2011 1641   LABBENZ NONE  DETECTED 11/16/2011 1641   AMPHETMU NONE DETECTED 11/16/2011 1641   THCU NONE DETECTED 11/16/2011 1641   LABBARB NONE DETECTED 11/16/2011 1641      IMAGING  Ct Head Wo Contrast 01/22/2015    1. No acute intracranial abnormalities. No evidence of a recent cortical infarct and no intracranial hemorrhage.  2. Moderate atrophy, mild chronic microvascular ischemic change and several old deep white matter lacune infarcts as well as a small old left frontal lobe cortical infarct.   Mr Brain Wo Contrast 01/23/2015    MRI HEAD:  Multiple acute tiny RIGHT posterior frontal lobe infarcts, MCA distribution.  Moderate to severe white matter small vessel ischemic disease, advanced for age though, similar. Old bilateral basal ganglia hemorrhagic lacunar infarcts.  LEFT frontal encephalomalacia is likely posttraumatic.   MRA HEAD:  No acute large vessel occlusion.  High-grade stenosis RIGHT M2 origin and distal RIGHT M2 segment.  High-grade stenosis distal RIGHT P2 segment. Moderate luminal irregularity of the intracranial vessels compatible with atherosclerosis.     2D echo Left ventricle: The cavity size was normal. Wall thickness was increased in a pattern of moderate LVH. Systolic function was normal. The estimated ejection fraction was in the range of 55% to 60%. Wall motion was normal; there were no regional  wall motion abnormalities CUS 1-39% ICA stenosis. Vertebral artery flow is antegrade.     PHYSICAL EXAM  Temp:  [97.6 F (36.4 C)-98.2 F (36.8 C)] 97.6 F (36.4 C) (09/20 0432) Pulse Rate:  [46-55] 46 (09/20 0457) Resp:  [16-18] 18 (09/20 0432) BP: (133-179)/(57-103) 172/103 mmHg (09/20 1123) SpO2:  [98 %-100 %] 98 % (09/20 0432)  General - Well nourished, well developed, in no apparent distress.  Ophthalmologic - Fundi not visualized due to noncooperation.  Cardiovascular - Regular rate and rhythm with no murmur.  Mental Status -  Level of arousal and orientation to  time, place, and person were intact. Language including expression, naming, repetition, comprehension was assessed and found intact. Mild dysarthria Fund of Knowledge was assessed and was intact.  Cranial Nerves II - XII - II - Visual field intact OU. III, IV, VI - Extraocular movements intact. V - Facial sensation intact bilaterally. VII - Facial movement intact bilaterally. VIII - Hearing & vestibular intact bilaterally. X - Palate elevates symmetrically,   XI - Chin turning & shoulder shrug intact bilaterally. XII - Tongue protrusion intact.  Motor Strength - The patient's strength was normal in all extremities and pronator drift was absent.  Bulk was normal and fasciculations were absent.   Motor Tone - Muscle tone was assessed at the neck and appendages and was normal.  Reflexes - The patient's reflexes were 1+ in all extremities and he had no pathological reflexes.  Sensory - Light touch, temperature/pinprick were assessed and were symmetrical.    Coordination - The patient had normal movements in the hands and feet with no ataxia or dysmetria.  Tremor was absent.  Gait and Station - deferred due to safety concerns.   ASSESSMENT/PLAN [name] is a 64 y.o. male with history of a previous stroke in 2013, hypertension, hyperlipidemia, ongoing tobacco use, and gout presenting with dysarthria. He did not receive IV t-PA due to minimal deficits.  Stroke:  Right frontal WM punctate infarcts, likely due to large vessel athero due to right M2 high grade stenosis in the setting of diffuse intracranial atherosclerosis.  Resultant dysarthria  MRI  Multiple acute tiny RIGHT posterior frontal lobe infarcts, MCA distribution.  MRA  Diffuse intracranial athero with right M2 high grade stenosis  Carotid Doppler  pending  2D Echo pending  LDL 180  HgbA1c 6.3  VTE prophylaxis - Lovenox Diet heart healthy/carb modified Room service appropriate?: Yes; Fluid  consistency:: Thin  aspirin 81 mg orally every day prior to admission, now on aspirin 81 mg orally every day and clopidogrel 75 mg orally every day. Due to diffuse intracranial atherosclerosis, will recommend dual antiplatelet for 3 months and then plavix alone.   Patient counseled to be compliant with his antithrombotic medications  Ongoing aggressive stroke risk factor management  Therapy recommendations: No further physical therapy recommended  Disposition:  Pending  Hypertension  Blood pressures tend to run mildly high  Permissive hypertension (OK if < 220/120) but gradually normalize in 5-7 days  Hyperlipidemia  Home meds:  Zocor 20 mg daily resumed in hospital  LDL 180, goal < 70  Increase Zocor to 40 mg daily - encourage compliance  Continue statin at discharge  Diabetes  HgbA1c 6.3, goal < 7.0  Controlled  Tobacco abuse  Current smoker  Smoking cessation counseling provided  Pt is willing to quit  Other Stroke Risk Factors  Advanced age  Hx stroke/TIA  Other Active Problems  Medical noncompliance  Gout on allopurinol  Follow up with [facility] neurology  Hospital day # 4      64 yo M with hx of HTN, HLD, DM, MI s/p multiple stent, stroke in 2013 (left CR), current smoker and gout admitted for right frontal WM subcortical punctate infarcts, right MCA distribution, consistent with right M2 high grade stenosis in the setting of diffuse intracranial athero. Put on dual antiplatelet and up his zocor.   Follow up with [place] neurology in [place], MD Stroke Neurology 01/26/2015 2:08 PM     To contact Stroke Continuity Sean Collins, please refer to [email or web address] After hours, contact General Neurology

## 2015-01-26 NOTE — Progress Notes (Signed)
Nsg Discharge Note  Admit Date:  01/22/2015 Discharge date: 01/26/2015   Lydia Guiles Beaumier to be D/C'd Home per MD order.  AVS completed.  Copy for chart, and copy for patient signed, and dated. Patient/caregiver able to verbalize understanding.  Discharge Medication:   Medication List    STOP taking these medications        simvastatin 20 MG tablet  Commonly known as:  ZOCOR      TAKE these medications        allopurinol 300 MG tablet  Commonly known as:  ZYLOPRIM  Take 300 mg by mouth daily.     amLODipine 10 MG tablet  Commonly known as:  NORVASC  Take 0.5 tablets (5 mg total) by mouth daily.     aspirin EC 81 MG tablet  Take 81 mg by mouth every 4 (four) hours as needed for mild pain or moderate pain.     atenolol 50 MG tablet  Commonly known as:  TENORMIN  Take 1 tablet (50 mg total) by mouth 2 (two) times daily.     atorvastatin 20 MG tablet  Commonly known as:  LIPITOR  Take 1 tablet (20 mg total) by mouth daily at 6 PM.     carboxymethylcellulose 0.5 % Soln  Commonly known as:  REFRESH PLUS  Place 1 drop into both eyes 3 (three) times daily as needed (dry eyes).     cholecalciferol 1000 UNITS tablet  Commonly known as:  VITAMIN D  Take 1,000 Units by mouth daily.     clopidogrel 75 MG tablet  Commonly known as:  PLAVIX  Take 1 tablet (75 mg total) by mouth daily.     enalapril 20 MG tablet  Commonly known as:  VASOTEC  Take 1 tablet (20 mg total) by mouth daily.     folic acid 1 MG tablet  Commonly known as:  FOLVITE  Take 1 mg by mouth daily.     omeprazole 20 MG capsule  Commonly known as:  PRILOSEC  Take 20 mg by mouth daily.        Discharge Assessment: Filed Vitals:   01/26/15 0457  BP: 137/60  Pulse: 46  Temp:   Resp:    Skin clean, dry and intact without evidence of skin break down, no evidence of skin tears noted. IV catheter discontinued intact. Site without signs and symptoms of complications - no redness or edema noted at  insertion site, patient denies c/o pain - only slight tenderness at site.  Dressing with slight pressure applied.  D/c Instructions-Education: Discharge instructions given to patient/family with verbalized understanding. D/c education completed with patient/family including follow up instructions, medication list, d/c activities limitations if indicated, with other d/c instructions as indicated by MD - patient able to verbalize understanding, all questions fully answered. Patient instructed to return to ED, call 911, or call MD for any changes in condition.  Patient escorted via [place], and D/C home via private auto.  Gleason, Ginger Margaretha Sheffield, RN 01/26/2015 11:01 AM

## 2015-01-26 NOTE — Care Management Note (Addendum)
Case Management Note  Patient Details  Name: Sean Collins MRN: [id number] Date of Birth: 1951-01-31  Subjective/Objective:   Patient is for dc today, [facility] this am and stated patient will have to be responsible for his first 30 days of meds.  NCM spoke with patient and advised him of this and told him the plavix is $30 and generic for lipitor is $30  at [facility] he states he can afford this. He states his brother will be transporting him home today.   Patient upset that medication from home could not be located on day of dc, staff later found medication and returned to patient.  All medication returned to patient except new meds plavix and lipitor which he will get from [facility] that patient needs to get his first 30 day dose for $30 each.                  Action/Plan:   Expected Discharge Date:                  Expected Discharge Plan:  Home/Self Care  In-House Referral:     Discharge planning Services  CM Consult  Post Acute Care Choice:    Choice offered to:     DME Arranged:    DME Agency:     HH Arranged:    [place] Agency:     Status of Service:  Completed, signed off  Medicare Important Message Given:    Date Medicare IM Given:    Medicare IM give by:    Date Additional Medicare IM Given:    Additional Medicare Important Message give by:     If discussed at [place] of Stay Meetings, dates discussed:    Additional Comments:  Zenon Mayo, RN 01/26/2015, 11:07 AM

## 2015-10-23 ENCOUNTER — Emergency Department (HOSPITAL_COMMUNITY): Payer: Medicare Other

## 2015-10-23 ENCOUNTER — Encounter (HOSPITAL_COMMUNITY): Payer: Self-pay | Admitting: *Deleted

## 2015-10-23 ENCOUNTER — Inpatient Hospital Stay (HOSPITAL_COMMUNITY): Payer: Medicare Other

## 2015-10-23 ENCOUNTER — Inpatient Hospital Stay (HOSPITAL_COMMUNITY)
Admission: EM | Admit: 2015-10-23 | Discharge: 2015-11-08 | DRG: 064 | Disposition: A | Discharge status: Skilled Nursing Facility | Payer: Medicare Other | Attending: Internal Medicine | Admitting: Internal Medicine

## 2015-10-23 DIAGNOSIS — I672 Cerebral atherosclerosis: Secondary | ICD-10-CM | POA: Diagnosis present

## 2015-10-23 DIAGNOSIS — K921 Melena: Secondary | ICD-10-CM | POA: Diagnosis not present

## 2015-10-23 DIAGNOSIS — E875 Hyperkalemia: Secondary | ICD-10-CM | POA: Diagnosis not present

## 2015-10-23 DIAGNOSIS — I63511 Cerebral infarction due to unspecified occlusion or stenosis of right middle cerebral artery: Principal | ICD-10-CM | POA: Diagnosis present

## 2015-10-23 DIAGNOSIS — R52 Pain, unspecified: Secondary | ICD-10-CM | POA: Diagnosis not present

## 2015-10-23 DIAGNOSIS — R1310 Dysphagia, unspecified: Secondary | ICD-10-CM

## 2015-10-23 DIAGNOSIS — J690 Pneumonitis due to inhalation of food and vomit: Secondary | ICD-10-CM | POA: Diagnosis not present

## 2015-10-23 DIAGNOSIS — I6523 Occlusion and stenosis of bilateral carotid arteries: Secondary | ICD-10-CM | POA: Diagnosis present

## 2015-10-23 DIAGNOSIS — R4781 Slurred speech: Secondary | ICD-10-CM | POA: Insufficient documentation

## 2015-10-23 DIAGNOSIS — I52 Other heart disorders in diseases classified elsewhere: Secondary | ICD-10-CM | POA: Diagnosis present

## 2015-10-23 DIAGNOSIS — K922 Gastrointestinal hemorrhage, unspecified: Secondary | ICD-10-CM | POA: Diagnosis not present

## 2015-10-23 DIAGNOSIS — N179 Acute kidney failure, unspecified: Secondary | ICD-10-CM | POA: Diagnosis not present

## 2015-10-23 DIAGNOSIS — E1159 Type 2 diabetes mellitus with other circulatory complications: Secondary | ICD-10-CM | POA: Diagnosis present

## 2015-10-23 DIAGNOSIS — R809 Proteinuria, unspecified: Secondary | ICD-10-CM | POA: Diagnosis not present

## 2015-10-23 DIAGNOSIS — R579 Shock, unspecified: Secondary | ICD-10-CM | POA: Diagnosis not present

## 2015-10-23 DIAGNOSIS — F17210 Nicotine dependence, cigarettes, uncomplicated: Secondary | ICD-10-CM

## 2015-10-23 DIAGNOSIS — I69921 Dysphasia following unspecified cerebrovascular disease: Secondary | ICD-10-CM | POA: Diagnosis not present

## 2015-10-23 DIAGNOSIS — D649 Anemia, unspecified: Secondary | ICD-10-CM | POA: Diagnosis not present

## 2015-10-23 DIAGNOSIS — Z7982 Long term (current) use of aspirin: Secondary | ICD-10-CM | POA: Diagnosis not present

## 2015-10-23 DIAGNOSIS — R471 Dysarthria and anarthria: Secondary | ICD-10-CM | POA: Diagnosis not present

## 2015-10-23 DIAGNOSIS — I639 Cerebral infarction, unspecified: Secondary | ICD-10-CM | POA: Diagnosis present

## 2015-10-23 DIAGNOSIS — Z931 Gastrostomy status: Secondary | ICD-10-CM | POA: Diagnosis not present

## 2015-10-23 DIAGNOSIS — E1169 Type 2 diabetes mellitus with other specified complication: Secondary | ICD-10-CM | POA: Diagnosis present

## 2015-10-23 DIAGNOSIS — I6521 Occlusion and stenosis of right carotid artery: Secondary | ICD-10-CM | POA: Diagnosis not present

## 2015-10-23 DIAGNOSIS — Z7984 Long term (current) use of oral hypoglycemic drugs: Secondary | ICD-10-CM | POA: Diagnosis not present

## 2015-10-23 DIAGNOSIS — Z4659 Encounter for fitting and adjustment of other gastrointestinal appliance and device: Secondary | ICD-10-CM

## 2015-10-23 DIAGNOSIS — E876 Hypokalemia: Secondary | ICD-10-CM | POA: Diagnosis not present

## 2015-10-23 DIAGNOSIS — Z4682 Encounter for fitting and adjustment of non-vascular catheter: Secondary | ICD-10-CM | POA: Diagnosis not present

## 2015-10-23 DIAGNOSIS — I10 Essential (primary) hypertension: Secondary | ICD-10-CM | POA: Diagnosis present

## 2015-10-23 DIAGNOSIS — I63233 Cerebral infarction due to unspecified occlusion or stenosis of bilateral carotid arteries: Secondary | ICD-10-CM | POA: Diagnosis not present

## 2015-10-23 DIAGNOSIS — I63411 Cerebral infarction due to embolism of right middle cerebral artery: Secondary | ICD-10-CM | POA: Diagnosis not present

## 2015-10-23 DIAGNOSIS — R109 Unspecified abdominal pain: Secondary | ICD-10-CM | POA: Diagnosis not present

## 2015-10-23 DIAGNOSIS — I2510 Atherosclerotic heart disease of native coronary artery without angina pectoris: Secondary | ICD-10-CM | POA: Diagnosis present

## 2015-10-23 DIAGNOSIS — E878 Other disorders of electrolyte and fluid balance, not elsewhere classified: Secondary | ICD-10-CM | POA: Diagnosis not present

## 2015-10-23 DIAGNOSIS — M109 Gout, unspecified: Secondary | ICD-10-CM | POA: Diagnosis present

## 2015-10-23 DIAGNOSIS — I63311 Cerebral infarction due to thrombosis of right middle cerebral artery: Secondary | ICD-10-CM | POA: Insufficient documentation

## 2015-10-23 DIAGNOSIS — I959 Hypotension, unspecified: Secondary | ICD-10-CM | POA: Diagnosis not present

## 2015-10-23 DIAGNOSIS — E440 Moderate protein-calorie malnutrition: Secondary | ICD-10-CM | POA: Insufficient documentation

## 2015-10-23 DIAGNOSIS — F17200 Nicotine dependence, unspecified, uncomplicated: Secondary | ICD-10-CM | POA: Diagnosis present

## 2015-10-23 DIAGNOSIS — K9421 Gastrostomy hemorrhage: Secondary | ICD-10-CM | POA: Diagnosis not present

## 2015-10-23 DIAGNOSIS — G8194 Hemiplegia, unspecified affecting left nondominant side: Secondary | ICD-10-CM | POA: Diagnosis not present

## 2015-10-23 DIAGNOSIS — Z833 Family history of diabetes mellitus: Secondary | ICD-10-CM | POA: Diagnosis not present

## 2015-10-23 DIAGNOSIS — R935 Abnormal findings on diagnostic imaging of other abdominal regions, including retroperitoneum: Secondary | ICD-10-CM | POA: Diagnosis not present

## 2015-10-23 DIAGNOSIS — R29818 Other symptoms and signs involving the nervous system: Secondary | ICD-10-CM | POA: Diagnosis not present

## 2015-10-23 DIAGNOSIS — Z431 Encounter for attention to gastrostomy: Secondary | ICD-10-CM | POA: Diagnosis not present

## 2015-10-23 DIAGNOSIS — D62 Acute posthemorrhagic anemia: Secondary | ICD-10-CM | POA: Diagnosis not present

## 2015-10-23 DIAGNOSIS — E872 Acidosis: Secondary | ICD-10-CM | POA: Diagnosis not present

## 2015-10-23 DIAGNOSIS — K920 Hematemesis: Secondary | ICD-10-CM | POA: Diagnosis not present

## 2015-10-23 DIAGNOSIS — E785 Hyperlipidemia, unspecified: Secondary | ICD-10-CM | POA: Diagnosis present

## 2015-10-23 DIAGNOSIS — R05 Cough: Secondary | ICD-10-CM

## 2015-10-23 DIAGNOSIS — R1312 Dysphagia, oropharyngeal phase: Secondary | ICD-10-CM | POA: Diagnosis not present

## 2015-10-23 DIAGNOSIS — I69391 Dysphagia following cerebral infarction: Secondary | ICD-10-CM | POA: Diagnosis not present

## 2015-10-23 DIAGNOSIS — I152 Hypertension secondary to endocrine disorders: Secondary | ICD-10-CM | POA: Diagnosis present

## 2015-10-23 DIAGNOSIS — I6330 Cerebral infarction due to thrombosis of unspecified cerebral artery: Secondary | ICD-10-CM | POA: Diagnosis not present

## 2015-10-23 DIAGNOSIS — I69392 Facial weakness following cerebral infarction: Secondary | ICD-10-CM | POA: Diagnosis not present

## 2015-10-23 DIAGNOSIS — I252 Old myocardial infarction: Secondary | ICD-10-CM

## 2015-10-23 DIAGNOSIS — I472 Ventricular tachycardia: Secondary | ICD-10-CM | POA: Diagnosis present

## 2015-10-23 DIAGNOSIS — Z8673 Personal history of transient ischemic attack (TIA), and cerebral infarction without residual deficits: Secondary | ICD-10-CM

## 2015-10-23 DIAGNOSIS — R4701 Aphasia: Secondary | ICD-10-CM | POA: Diagnosis not present

## 2015-10-23 DIAGNOSIS — J9601 Acute respiratory failure with hypoxia: Secondary | ICD-10-CM | POA: Diagnosis not present

## 2015-10-23 DIAGNOSIS — I69322 Dysarthria following cerebral infarction: Secondary | ICD-10-CM

## 2015-10-23 DIAGNOSIS — I6789 Other cerebrovascular disease: Secondary | ICD-10-CM | POA: Diagnosis not present

## 2015-10-23 DIAGNOSIS — Z79899 Other long term (current) drug therapy: Secondary | ICD-10-CM

## 2015-10-23 DIAGNOSIS — Z955 Presence of coronary angioplasty implant and graft: Secondary | ICD-10-CM | POA: Diagnosis not present

## 2015-10-23 DIAGNOSIS — Z978 Presence of other specified devices: Secondary | ICD-10-CM | POA: Diagnosis not present

## 2015-10-23 DIAGNOSIS — E119 Type 2 diabetes mellitus without complications: Secondary | ICD-10-CM | POA: Diagnosis not present

## 2015-10-23 DIAGNOSIS — M6281 Muscle weakness (generalized): Secondary | ICD-10-CM | POA: Diagnosis not present

## 2015-10-23 DIAGNOSIS — R059 Cough, unspecified: Secondary | ICD-10-CM

## 2015-10-23 HISTORY — DX: Type 2 diabetes mellitus without complications: E11.9

## 2015-10-23 LAB — URINE MICROSCOPIC-ADD ON: BACTERIA UA: NONE SEEN

## 2015-10-23 LAB — COMPREHENSIVE METABOLIC PANEL
ALBUMIN: 3.4 g/dL — AB (ref 3.5–5.0)
ALT: 17 U/L (ref 17–63)
AST: 23 U/L (ref 15–41)
Alkaline Phosphatase: 71 U/L (ref 38–126)
Anion gap: 8 (ref 5–15)
BILIRUBIN TOTAL: 0.4 mg/dL (ref 0.3–1.2)
BUN: 15 mg/dL (ref 6–20)
CO2: 19 mmol/L — ABNORMAL LOW (ref 22–32)
Calcium: 9.2 mg/dL (ref 8.9–10.3)
Chloride: 110 mmol/L (ref 101–111)
Creatinine, Ser: 1.29 mg/dL — ABNORMAL HIGH (ref 0.61–1.24)
GFR calc Af Amer: >60 mL/min (ref >60)
GFR calc non Af Amer: 57 mL/min — ABNORMAL LOW (ref >60)
GLUCOSE: 95 mg/dL (ref 65–99)
POTASSIUM: 4 mmol/L (ref 3.5–5.1)
Sodium: 137 mmol/L (ref 135–145)
TOTAL PROTEIN: 7.1 g/dL (ref 6.5–8.1)

## 2015-10-23 LAB — URINALYSIS, ROUTINE W REFLEX MICROSCOPIC
BILIRUBIN URINE: NEGATIVE
GLUCOSE, UA: NEGATIVE mg/dL
HGB URINE DIPSTICK: NEGATIVE
KETONES UR: NEGATIVE mg/dL
LEUKOCYTES UA: NEGATIVE
Nitrite: NEGATIVE
Specific Gravity, Urine: 1.017 (ref 1.005–1.030)
pH: 5.5 (ref 5.0–8.0)

## 2015-10-23 LAB — APTT: APTT: 29 s (ref 24–37)

## 2015-10-23 LAB — I-STAT TROPONIN, ED: Troponin i, poc: 0.01 ng/mL (ref 0.00–0.08)

## 2015-10-23 LAB — CBC
HCT: 37.5 % — ABNORMAL LOW (ref 39.0–52.0)
HEMOGLOBIN: 12.2 g/dL — AB (ref 13.0–17.0)
MCH: 26.9 pg (ref 26.0–34.0)
MCHC: 32.5 g/dL (ref 30.0–36.0)
MCV: 82.6 fL (ref 78.0–100.0)
Platelets: 217 10*3/uL (ref 150–400)
RBC: 4.54 MIL/uL (ref 4.22–5.81)
RDW: 15.6 % — ABNORMAL HIGH (ref 11.5–15.5)
WBC: 7.3 10*3/uL (ref 4.0–10.5)

## 2015-10-23 LAB — I-STAT CHEM 8, ED
BUN: 18 mg/dL (ref 6–20)
Calcium, Ion: 1.17 mmol/L (ref 1.13–1.30)
Chloride: 107 mmol/L (ref 101–111)
Creatinine, Ser: 1.3 mg/dL — ABNORMAL HIGH (ref 0.61–1.24)
Glucose, Bld: 89 mg/dL (ref 65–99)
HCT: 41 % (ref 39.0–52.0)
Hemoglobin: 13.9 g/dL (ref 13.0–17.0)
Potassium: 4 mmol/L (ref 3.5–5.1)
SODIUM: 141 mmol/L (ref 135–145)
TCO2: 21 mmol/L (ref 0–100)

## 2015-10-23 LAB — DIFFERENTIAL
BASOS ABS: 0 10*3/uL (ref 0.0–0.1)
Basophils Relative: 0 %
Eosinophils Absolute: 0.5 10*3/uL (ref 0.0–0.7)
Eosinophils Relative: 7 %
LYMPHS ABS: 2.2 10*3/uL (ref 0.7–4.0)
LYMPHS PCT: 30 %
Monocytes Absolute: 0.6 10*3/uL (ref 0.1–1.0)
Monocytes Relative: 8 %
NEUTROS ABS: 4 10*3/uL (ref 1.7–7.7)
NEUTROS PCT: 55 %

## 2015-10-23 LAB — GLUCOSE, CAPILLARY
GLUCOSE-CAPILLARY: 77 mg/dL (ref 65–99)
GLUCOSE-CAPILLARY: 133 mg/dL — AB (ref 65–99)

## 2015-10-23 LAB — RAPID URINE DRUG SCREEN, HOSP PERFORMED
Amphetamines: NOT DETECTED
BARBITURATES: NOT DETECTED
BENZODIAZEPINES: NOT DETECTED
COCAINE: NOT DETECTED
Opiates: NOT DETECTED
TETRAHYDROCANNABINOL: NOT DETECTED

## 2015-10-23 LAB — ETHANOL: Alcohol, Ethyl (B): <5 mg/dL (ref <5)

## 2015-10-23 LAB — PROTIME-INR
INR: 1.11 (ref 0.00–1.49)
Prothrombin Time: 14.5 seconds (ref 11.6–15.2)

## 2015-10-23 LAB — CBG MONITORING, ED
GLUCOSE-CAPILLARY: 87 mg/dL (ref 65–99)
GLUCOSE-CAPILLARY: 94 mg/dL (ref 65–99)

## 2015-10-23 MED ORDER — PANTOPRAZOLE SODIUM 40 MG PO TBEC
40.0000 mg | DELAYED_RELEASE_TABLET | Freq: Every day | ORAL | Status: DC
  Administered 2015-10-23 – 2015-10-25 (×3): 40 mg via ORAL
  Filled 2015-10-23 (×4): qty 1

## 2015-10-23 MED ORDER — CLOPIDOGREL BISULFATE 75 MG PO TABS
75.0000 mg | ORAL_TABLET | Freq: Every day | ORAL | Status: DC
  Administered 2015-10-23 – 2015-10-25 (×3): 75 mg via ORAL
  Filled 2015-10-23 (×4): qty 1

## 2015-10-23 MED ORDER — SENNOSIDES-DOCUSATE SODIUM 8.6-50 MG PO TABS: 1.0000 | ORAL_TABLET | Freq: Every evening | ORAL | Status: DC | PRN

## 2015-10-23 MED ORDER — STROKE: EARLY STAGES OF RECOVERY BOOK
Freq: Once | Status: AC
  Administered 2015-10-23: 22:00:00

## 2015-10-23 MED ORDER — SODIUM CHLORIDE 0.9 % IV SOLN
INTRAVENOUS | Status: AC
  Administered 2015-10-23: 22:00:00 via INTRAVENOUS

## 2015-10-23 MED ORDER — ALLOPURINOL 100 MG PO TABS
300.0000 mg | ORAL_TABLET | Freq: Every day | ORAL | Status: DC
  Administered 2015-10-23 – 2015-10-25 (×3): 300 mg via ORAL
  Filled 2015-10-23 (×4): qty 3

## 2015-10-23 MED ORDER — ATORVASTATIN CALCIUM 10 MG PO TABS
20.0000 mg | ORAL_TABLET | Freq: Every day | ORAL | Status: DC
  Administered 2015-10-23: 20 mg via ORAL
  Filled 2015-10-23: qty 2

## 2015-10-23 MED ORDER — ENOXAPARIN SODIUM 40 MG/0.4ML ~~LOC~~ SOLN
40.0000 mg | SUBCUTANEOUS | Status: DC
  Administered 2015-10-23 – 2015-10-30 (×7): 40 mg via SUBCUTANEOUS
  Filled 2015-10-23 (×7): qty 0.4

## 2015-10-23 NOTE — ED Notes (Signed)
Pt arrives from work via [employer]. Pt was seen at 1400 and began having a sudden onset of slurred speech and dizziness. Pt states he has had multiple TIAs in the past. Pt states when this began he was having difficulty with speaking at all. Pt is experiencing dysarthria upon arrival and has c/o dizziness.

## 2015-10-23 NOTE — ED Provider Notes (Signed)
CSN: [id number]     Arrival date & time 10/23/15  1439 History   First MD Initiated Contact with Patient 10/23/15 1502     Chief Complaint  Patient presents with  . Code Stroke     (Consider location/radiation/quality/duration/timing/severity/associated sxs/prior Treatment) HPI Was at work at 2 PM when he had sudden onset of slurred speech and dizziness. No other associated symptoms. Patient reports this has happened to him several times in the past. Last time he reports was about a year ago. He reports the symptoms eventually resolved. He denies he's been sick or ill leading up to this. No associated headache. No associated syncope. No chest pain or shortness of breath. Patient does take a daily aspirin and Plavix is on his medication list although he does not specifically recall it by name at this time. Past Medical History  Diagnosis Date  . Hypertension   . Hyperlipidemia   . Stroke [facility])    History reviewed. No pertinent past surgical history. History reviewed. No pertinent family history. Social History  Substance Use Topics  . Smoking status: Current Every Day Smoker -- 0.50 packs/day for 2 years    Types: Cigarettes  . Smokeless tobacco: None  . Alcohol Use: No    Review of Systems 10 Systems reviewed and are negative for acute change except as noted in the HPI.    Allergies  Review of patient's allergies indicates no known allergies.  Home Medications   Prior to Admission medications   Medication Sig Start Date End Date Taking? Authorizing Provider  allopurinol (ZYLOPRIM) 300 MG tablet Take 300 mg by mouth daily.    Historical Provider, MD  amLODipine (NORVASC) 10 MG tablet Take 0.5 tablets (5 mg total) by mouth daily. Patient taking differently: Take 10 mg by mouth daily.  11/18/11   [name], MD  aspirin EC 81 MG tablet Take 81 mg by mouth daily.     Historical Provider, MD  atenolol (TENORMIN) 50 MG tablet Take 1 tablet (50 mg total) by mouth 2 (two) times  daily. 11/18/11   [name], MD  atorvastatin (LIPITOR) 20 MG tablet Take 1 tablet (20 mg total) by mouth daily at 6 PM. 01/25/15   Donne Hazel, MD  carboxymethylcellulose (REFRESH PLUS) 0.5 % SOLN Place 1 drop into both eyes 3 (three) times daily as needed (dry eyes).    Historical Provider, MD  cholecalciferol (VITAMIN D) 1000 UNITS tablet Take 1,000 Units by mouth daily.    Historical Provider, MD  clopidogrel (PLAVIX) 75 MG tablet Take 1 tablet (75 mg total) by mouth daily. 01/25/15   Donne Hazel, MD  enalapril (VASOTEC) 20 MG tablet Take 1 tablet (20 mg total) by mouth daily. 11/18/11   [name], MD  folic acid (FOLVITE) 1 MG tablet Take 1 mg by mouth daily.    Historical Provider, MD  omeprazole (PRILOSEC) 20 MG capsule Take 20 mg by mouth daily.    Historical Provider, MD   Wt 168 lb 10.4 oz (76.5 kg) Physical Exam  Constitutional: He is oriented to person, place, and time. He appears well-developed and well-nourished. No distress.  Patient is alert. No respiratory distress.  HENT:  Head: Normocephalic and atraumatic.  Nose: Nose normal.  Mouth/Throat: Oropharynx is clear and moist.  Slight white plaque on tongue. Posterior oropharynx widely patent.  Eyes: EOM are normal. Pupils are equal, round, and reactive to light.  Neck: Neck supple.  Cardiovascular: Normal rate, regular rhythm, normal  heart sounds and intact distal pulses.   Pulmonary/Chest: Effort normal and breath sounds normal.  Abdominal: Soft. Bowel sounds are normal. He exhibits no distension. There is no tenderness.  Musculoskeletal: Normal range of motion. He exhibits no edema or tenderness.  Patient does have significant intrinsic hand muscular atrophy. No peripheral edema. Calves are soft and nontender.  Neurological: He is alert and oriented to person, place, and time. He has normal strength. He exhibits normal muscle tone. Coordination normal. GCS eye subscore is 4. GCS verbal subscore is 5. GCS motor  subscore is 6.  Patient does have slurred speech but content is normal. Normal level of alertness and recall. No cranial nerve asymmetry. Upper extremity grip strength symmetric. No pronator drift. Patient can independently elevate each lower extremity off of the bed and hold it. Difference to light touch left to right.  Skin: Skin is warm, dry and intact.  Psychiatric: He has a normal mood and affect.    ED Course  Procedures (including critical care time) CRITICAL CARE Performed by: Charlesetta Shanks   Total critical care time: 30  minutes  Critical care time was exclusive of separately billable procedures and treating other patients.  Critical care was necessary to treat or prevent imminent or life-threatening deterioration.  Critical care was time spent personally by me on the following activities: development of treatment plan with patient and/or surrogate as well as nursing, discussions with consultants, evaluation of patient's response to treatment, examination of patient, obtaining history from patient or surrogate, ordering and performing treatments and interventions, ordering and review of laboratory studies, ordering and review of radiographic studies, pulse oximetry and re-evaluation of patient's condition. Labs Review Labs Reviewed  CBC - Abnormal; Notable for the following:    Hemoglobin 12.2 (*)    HCT 37.5 (*)    RDW 15.6 (*)    All other components within normal limits  COMPREHENSIVE METABOLIC PANEL - Abnormal; Notable for the following:    CO2 19 (*)    Creatinine, Ser 1.29 (*)    Albumin 3.4 (*)    GFR calc non Af Amer 57 (*)    All other components within normal limits  I-STAT CHEM 8, ED - Abnormal; Notable for the following:    Creatinine, Ser 1.30 (*)    All other components within normal limits  ETHANOL  PROTIME-INR  APTT  DIFFERENTIAL  URINE RAPID DRUG SCREEN, HOSP PERFORMED  URINALYSIS, ROUTINE W REFLEX MICROSCOPIC (NOT AT [facility])  I-STAT TROPOININ, ED     Imaging Review Ct Head Wo Contrast  10/23/2015  CLINICAL DATA:  Patient with sudden onset slurred speech and dizziness. Code stroke. EXAM: CT HEAD WITHOUT CONTRAST TECHNIQUE: Contiguous axial images were obtained from the base of the skull through the vertex without intravenous contrast. COMPARISON:  MRI brain 01/23/2015; CT brain 01/22/2015. FINDINGS: Ventricles and sulci are prominent for patient's age. Old left frontal lobe encephalomalacia. Periventricular and subcortical white matter hypodensity compatible with chronic microvascular ischemic changes. No evidence for large territory infarct, intracranial hemorrhage, mass lesion or mass-effect. Orbits are unremarkable. Ethmoid air cell mucosal thickening. Mastoid air cells unremarkable. Calvarium is intact. IMPRESSION: No acute intracranial process. Chronic microvascular ischemic changes and cortical atrophy. Critical Value/emergent results were called by telephone at the time of interpretation on 10/23/2015 at 3:03 pm to Dr. Barbee Cough, who verbally acknowledged these results. Electronically Signed   By: Lovey Newcomer M.D.   On: 10/23/2015 15:12   I have personally reviewed and evaluated these images and lab  results as part of my medical decision-making.  EKG:  by ED physician Molly Maselli Sinus rhythm 49 PR 184 QRS 123 inferior and lateral T-wave inversions Left bundle branch block No STEMI Abnormal  Consult: Neurology consult as code stroke patient evaluated and not a TPA candidate. Recognition for admission to medical service. Consult: Reviewed with teaching service internal medicine. Initially for Dr. Pamelia Hoit. MDM   Final diagnoses:  Cerebral infarction due to unspecified mechanism  Slurred speech   Presents with acute onset of slurred speech. He has prior history of CVA and TIAs. At this time his cognitive function is normal. There is no localizing motor deficit. He has been evaluated as code stroke by neurology. Patient is currently  not a TPA candidate. Plan will be to admit for ongoing treatment of CVA versus TIA.    Charlesetta Shanks, MD 10/23/15 1550

## 2015-10-23 NOTE — H&P (Signed)
Date: 10/23/2015               Patient Name:  Sean Collins MRN: [id number]  DOB: August 15, 1950 Age / Sex: 65 y.o., male   PCP: Provider Default, MD         Medical Service: Internal Medicine Teaching Service         Attending Physician: Dr. Aldine Contes, MD    First Contact: Dr. Zada Finders Pager: [phone]  Second Contact: Dr. Charlott Rakes Pager: [phone]       After Hours (After 5p/  First Contact Pager: [phone]  weekends / holidays): Second Contact Pager: [phone]   Chief Complaint: "I lost my speech"  History of Present Illness:  Sean Collins is a 65 year old gentleman with PMH of multiple CVAs, HTN, HLD, diet controlled T2DM, and tobacco use who presents with dysarthria. 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 This was around 2 pm, per ED notes, patient unable to give estimate of time this began. Sean Collins says Sean Collins was able to speak his intended words, but his speech was dysarthric. Sean Collins says this was similar to his previous strokes. Sean Collins had associated lightheadedness and gait instability where Sean Collins would "fade" to the left when trying to walk straight. Sean Collins did not fall or lose consciousness. Sean Collins did not have any chest pain, SOB, palpitations, change in vision, headache, focal weakness, numbness or tingling.  Sean Collins was hospitalized for acute CVA in September 2016 where MRI showed multiple acute tiny right posterior frontal lobe infarcts in the MCA distribution. This was thought secondary to large vessel atherosclerosis after workup. Sean Collins was discharged on 3 months dual antiplatelet (ASA & Plavix) with plan for Plavix alone afterwards. It is unclear if patient is taking both now, but states Sean Collins has not missed any of his medications. Sean Collins was started on Lipitor 20 mg. 2d echo with EF 55-60% with normal wall motion with mod TR. [facility].  In the ED, patient was slightly  hypertensive at 150/81, bradycardic in the 50s. Workup revealed a slightly elevated SCr of 1.29 (1.2-1.5 over the last year), Hgb 12.2, MCV 82.6, negative point of care troponin, negative EtOH and UDS. UA shows >300 mg/dL proteinuria.  CT Head was negative for acute hemorrhage, mass, or intracranial process. Cortical atrophy and chronic microvascular ischemic changes are noted. Sean Collins was evaluated by Neurology in the ED and not considered a candidate for TPA.   Social Hx: Smoked since age 65, 0.5 PPD with a 10 year break, current smoker; no alcohol or illicit drug use. [place] Veteran. Works as a Games developer.  Family Hx: DM in brother.  Meds: Current Facility-Administered Medications  Medication Dose Route Frequency Provider Last Rate Last Dose  .  stroke: mapping our early stages of recovery book   Does not apply Once Zada Finders, MD      . 0.9 %  sodium chloride infusion   Intravenous STAT Charlesetta Shanks, MD      . allopurinol (ZYLOPRIM) tablet 300 mg  300 mg Oral Daily Riccardo Dubin, MD      . atorvastatin (LIPITOR) tablet 20 mg  20 mg Oral q1800 Riccardo Dubin, MD      . clopidogrel (PLAVIX) tablet 75 mg  75 mg Oral Daily Riccardo Dubin, MD      . enoxaparin (LOVENOX) injection 40 mg  40 mg Subcutaneous Q24H Zada Finders, MD      . pantoprazole (PROTONIX) EC tablet 40 mg  40 mg Oral Daily Riccardo Dubin, MD      . senna-docusate (Senokot-S) tablet 1 tablet  1 tablet Oral QHS PRN Zada Finders, MD        Allergies: Allergies as of 10/23/2015  . (No Known Allergies)   Past Medical History  Diagnosis Date  . Hypertension   . Hyperlipidemia   . Stroke [facility])    History reviewed. No pertinent past surgical history. Family History  Problem Relation Age of Onset  . Diabetes Brother    Social History   Social History  . Marital Status: Legally Separated    Spouse Name: N/A  . Number of Children: N/A  . Years of Education: N/A   Occupational History  . Not on  file.   Social History Main Topics  . Smoking status: Current Every Day Smoker -- 0.50 packs/day for 32 years    Types: Cigarettes  . Smokeless tobacco: Not on file     Comment: Started at age 72 though quit for 10 years at one point  . Alcohol Use: No  . Drug Use: No  . Sexual Activity: Not on file   Other Topics Concern  . 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  Eyes: Negative for double vision.  Respiratory: Negative for shortness of breath and wheezing.   Cardiovascular: Negative for chest pain, palpitations and leg swelling.  Gastrointestinal: Negative for nausea, vomiting and abdominal pain.       Hunger pains  Musculoskeletal: Negative for myalgias and falls.  Neurological: Positive for dizziness and speech change. Negative for tingling, tremors, sensory change, focal weakness, loss of consciousness and headaches.       Slurred speech. Staggers to left when walking  Psychiatric/Behavioral: Negative for substance abuse.     Physical Exam: Blood pressure 168/74, pulse 50, temperature 97.9 F (36.6 C), temperature source Oral, resp. rate 20, weight 168 lb 10.4 oz (76.5 kg), SpO2 100 %. Physical Exam  Constitutional: Sean Collins is oriented to person, place, and time. Sean Collins appears well-developed and well-nourished. No distress.  Bearded gentleman, no acute distress  HENT:  Head: Normocephalic and atraumatic.  Mouth/Throat: Oropharynx is clear and moist.  Eyes: EOM are normal. Pupils are equal, round, and reactive to light.  Neck: Neck supple. Carotid bruit is not present.  Cardiovascular:  No murmur heard. Distant heart sounds, bradycardic, Irregularly irregular rhythm  Pulmonary/Chest: Effort normal. No respiratory distress. Sean Collins has no  wheezes. Sean Collins has no rales. Sean Collins exhibits no tenderness.  Abdominal: Soft. Sean Collins exhibits no distension. There is no tenderness.  Musculoskeletal: Normal range of motion. Sean Collins exhibits no edema or tenderness.  Neurological: Sean Collins is alert and oriented to person, place, and time. Sean Collins displays normal reflexes.  Dysarthria, Strength 5/5 bilateral upper and lower extremities, no dysmetria on finger-to-nose, no pronator drift, heel-to-shin intact, finger grip intact, rapid finger tap intact, trouble with rapid alternating hand movement (due to understanding?), sensation intact, ?facial droop. Gait not assessed  Skin: Skin is warm. Sean Collins is not diaphoretic.  Acanthosis nigricans nape of neck  Psychiatric: Sean Collins has a normal mood and affect.  Lab results: Basic Metabolic Panel:  Recent Labs  10/23/15 1442 10/23/15 1449  NA 137 141  K 4.0 4.0  CL 110 107  CO2 19*  --   GLUCOSE 95 89  BUN 15 18  CREATININE 1.29* 1.30*  CALCIUM 9.2  --    Liver Function Tests:  Recent Labs  10/23/15 1442  AST 23  ALT 17  ALKPHOS 71  BILITOT 0.4  PROT 7.1  ALBUMIN 3.4*   No results for input(s): LIPASE, AMYLASE in the last 72 hours. No results for input(s): AMMONIA in the last 72 hours. CBC:  Recent Labs  10/23/15 1442 10/23/15 1449  WBC 7.3  --   NEUTROABS 4.0  --   HGB 12.2* 13.9  HCT 37.5* 41.0  MCV 82.6  --   PLT 217  --    Cardiac Enzymes: No results for input(s): CKTOTAL, CKMB, CKMBINDEX, TROPONINI in the last 72 hours. BNP: No results for input(s): PROBNP in the last 72 hours. D-Dimer: No results for input(s): DDIMER in the last 72 hours. CBG:  Recent Labs  10/23/15 1514 10/23/15 1517 10/23/15 1734  GLUCAP 94 87 77   Hemoglobin A1C: No results for input(s): HGBA1C in the last 72 hours. Fasting Lipid Panel: No results for input(s): CHOL, HDL, LDLCALC, TRIG, CHOLHDL, LDLDIRECT in the last 72 hours. Thyroid Function Tests: No results for input(s): TSH, T4TOTAL, FREET4, T3FREE,  THYROIDAB in the last 72 hours. Anemia Panel: No results for input(s): VITAMINB12, FOLATE, FERRITIN, TIBC, IRON, RETICCTPCT in the last 72 hours. Coagulation:  Recent Labs  10/23/15 1442  LABPROT 14.5  INR 1.11   Urine Drug Screen: Drugs of Abuse     Component Value Date/Time   LABOPIA NONE DETECTED 10/23/2015 1530   COCAINSCRNUR NONE DETECTED 10/23/2015 1530   LABBENZ NONE DETECTED 10/23/2015 1530   AMPHETMU NONE DETECTED 10/23/2015 1530   THCU NONE DETECTED 10/23/2015 1530   LABBARB NONE DETECTED 10/23/2015 1530    Alcohol Level:  Recent Labs  10/23/15 1442  ETH <5   Urinalysis:  Recent Labs  10/23/15 1530  COLORURINE YELLOW  LABSPEC 1.017  PHURINE 5.5  GLUCOSEU NEGATIVE  HGBUR NEGATIVE  BILIRUBINUR NEGATIVE  KETONESUR NEGATIVE  PROTEINUR >300*  NITRITE NEGATIVE  LEUKOCYTESUR NEGATIVE     Imaging results:  Ct Head Wo Contrast  10/23/2015  CLINICAL DATA:  Patient with sudden onset slurred speech and dizziness. Code stroke. EXAM: CT HEAD WITHOUT CONTRAST TECHNIQUE: Contiguous axial images were obtained from the base of the skull through the vertex without intravenous contrast. COMPARISON:  MRI brain 01/23/2015; CT brain 01/22/2015. FINDINGS: Ventricles and sulci are prominent for patient's age. Old left frontal lobe encephalomalacia. Periventricular and subcortical white matter hypodensity compatible with chronic microvascular ischemic changes. No evidence for large territory infarct, intracranial hemorrhage, mass lesion or mass-effect. Orbits are unremarkable. Ethmoid air cell mucosal thickening. Mastoid air cells unremarkable. Calvarium is intact. IMPRESSION: No acute intracranial process. Chronic microvascular ischemic changes and cortical atrophy. Critical Value/emergent results were called by telephone at the time of interpretation on 10/23/2015 at 3:03 pm to Dr. Barbee Cough, who verbally acknowledged these results. Electronically Signed   By: Lovey Newcomer M.D.    On: 10/23/2015 15:12    Other results: EKG: pending  Assessment & Plan by Problem: Principal Problem:   CVA (cerebral infarction) Active Problems:   Hypertension associated with diabetes ([place])   Dysarthria: Patient with dysarthria, lightheadedness, and gait instability staggering to his left. His prior strokes presented in a similar  pattern. Code stroke was called and neurology did not consider him a candidate for TPA. CT head without acute pathology. His medication list has him on aspirin and plavix, although Sean Collins is not able to clearly tell us if Sean Collins is on both or just one of these. Regardless, this is concerning that Sean Collins has failed secondary prevention if Sean Collins is found to have a new stroke. Will evaluate with usual stroke workup. -Neurology consulted, appreciate further recommendations -MRI, MRA brain w/o contrast -Check Hgb A1C, Lipid panel -TTE -PT/OT/Speech eval -Permissive HTN <220/120 -Risk prevention (smoking cessation) -Plavix 75 mg daily -May need to add Aspirin back  HTN: Takes Enalapril 20 mg qd, Atenolol 50 mg BID, Amlodipine 10 mg daily -Hold meds for permissive HTN  HLD: Takes Lipitor 20 mg  -Consider increasing Lipitor to 40 mg  Proteinuria: -Spot urine -Repeat BMP  Tobacco use: Patient now smoking 1 pack every two days. Acknowledges that Sean Collins needs to quit. Quit cold [place] in the past. -Continue smoking cessation counseling   Dispo: Disposition is deferred at this time, awaiting improvement of current medical problems. Anticipated discharge in approximately 1-2 day(s).   The patient does have a current PCP (Dr. Lavone Neri; [facility]) and does not need an [facility] hospital follow-up appointment after discharge.  The patient does have transportation limitations that hinder transportation to clinic appointments.  Signed: Zada Finders, MD 10/23/2015, 6:05 PM

## 2015-10-23 NOTE — Consult Note (Addendum)
Referring Physician: Dr. Johnney Killian    Chief Complaint: Slurred speech  HPI: Sean Collins is an 65 y.o. male   LSN: [id number] tPA Given: No, due to low NIHSS, chronic history of dysarthria, and no clear localization based upon neurological examination.   Past Medical History  Diagnosis Date  . Hypertension   . Hyperlipidemia   . Stroke     No past surgical history on file.  No family history on file. Social History:  reports that he has been smoking Cigarettes.  He has a 1 pack-year smoking history. He does not have any smokeless tobacco history on file. He reports that he does not drink alcohol or use illicit drugs.  Allergies: No Known Allergies  Medications:  No current facility-administered medications on file prior to encounter.   Current Outpatient Prescriptions on File Prior to Encounter  Medication Sig Dispense Refill  . allopurinol (ZYLOPRIM) 300 MG tablet Take 300 mg by mouth daily.    Marland Kitchen amLODipine (NORVASC) 10 MG tablet Take 0.5 tablets (5 mg total) by mouth daily. (Patient taking differently: Take 10 mg by mouth daily. ) 30 tablet 2  . aspirin EC 81 MG tablet Take 81 mg by mouth daily.     Marland Kitchen atenolol (TENORMIN) 50 MG tablet Take 1 tablet (50 mg total) by mouth 2 (two) times daily. 60 tablet 2  . atorvastatin (LIPITOR) 20 MG tablet Take 1 tablet (20 mg total) by mouth daily at 6 PM. 30 tablet 0  . carboxymethylcellulose (REFRESH PLUS) 0.5 % SOLN Place 1 drop into both eyes 3 (three) times daily as needed (dry eyes).    . cholecalciferol (VITAMIN D) 1000 UNITS tablet Take 1,000 Units by mouth daily.    . clopidogrel (PLAVIX) 75 MG tablet Take 1 tablet (75 mg total) by mouth daily. 30 tablet 0  . enalapril (VASOTEC) 20 MG tablet Take 1 tablet (20 mg total) by mouth daily. 30 tablet 2  . folic acid (FOLVITE) 1 MG tablet Take 1 mg by mouth daily.    Marland Kitchen omeprazole (PRILOSEC) 20 MG capsule Take 20 mg by mouth daily.       ROS: Patient stated he had no other complaints  other than urinary urgency at the time of neurological exam.  Physical Examination: There were no vitals taken for this visit.  Neurologic Examination: Ment: Intact. No receptive or expressive aphasia noted, with intact comprehension, naming and repetition. CN: EOMI, PERRL, visual fields intact, face symmetric. Mild buccolingual dysarthria noted. No hypophonia noted. Sensation intact.  Motor: 5/5 x 4.  Sensory: Intact to temperature x 4, except for small patch of temperature hypoesthesia on right forearm. No extinction. Reflexes: Unremarkable.  Cerebellar: No ataxia noted.  Gait: Deferred.   Results for orders placed or performed during the hospital encounter of 10/23/15 (from the past 48 hour(s))  I-stat troponin, ED (not at [facility], [facility])     Status: None   Collection Time: 10/23/15  2:47 PM  Result Value Ref Range   Troponin i, poc 0.01 0.00 - 0.08 ng/mL   Comment 3            Comment: Due to the release kinetics of cTnI, a negative result within the first hours of the onset of symptoms does not rule out myocardial infarction with certainty. If myocardial infarction is still suspected, repeat the test at appropriate intervals.   I-Stat Chem 8, ED  (not at [facility], [facility])     Status: Abnormal   Collection Time: 10/23/15  2:49 PM  Result Value Ref Range   Sodium 141 135 - 145 mmol/L   Potassium 4.0 3.5 - 5.1 mmol/L   Chloride 107 101 - 111 mmol/L   BUN 18 6 - 20 mg/dL   Creatinine, Ser 1.30 (H) 0.61 - 1.24 mg/dL   Glucose, Bld 89 65 - 99 mg/dL   Calcium, Ion 1.17 1.13 - 1.30 mmol/L   TCO2 21 0 - 100 mmol/L   Hemoglobin 13.9 13.0 - 17.0 g/dL   HCT 41.0 39.0 - 52.0 %   No results found.  Assessment: 65 y.o. male with acute onset of dysathria. Has improved, but not completely back to baseline. Given no definite localization on exam, stated history of progressive and chronic dysarthria, as well as low NIHSS score, IV tPA risks are felt to outweigh potential benefits. Discussed with  patient, who expressed understanding and wished not to proceed with IV tPA. Stroke Risk Factors - Hypertension, hyperlipidemia, smoking.   Recommendations: 1. MRI brain, MRA head, MRA neck, TTE. 2. BP management.  3. PT consult, OT consult, Speech consult 4. Telemetry monitoring 5. Continue ASA and atorvastatin 6. Frequent neuro checks  @Electronically  signed: Dr. Kerney Elbe 10/23/2015, 3:03 PM

## 2015-10-24 ENCOUNTER — Inpatient Hospital Stay (HOSPITAL_COMMUNITY): Payer: Medicare Other

## 2015-10-24 ENCOUNTER — Encounter (HOSPITAL_COMMUNITY): Payer: Self-pay | Admitting: Radiology

## 2015-10-24 DIAGNOSIS — I63411 Cerebral infarction due to embolism of right middle cerebral artery: Secondary | ICD-10-CM

## 2015-10-24 DIAGNOSIS — I639 Cerebral infarction, unspecified: Secondary | ICD-10-CM

## 2015-10-24 DIAGNOSIS — I6521 Occlusion and stenosis of right carotid artery: Secondary | ICD-10-CM

## 2015-10-24 DIAGNOSIS — E1159 Type 2 diabetes mellitus with other circulatory complications: Secondary | ICD-10-CM

## 2015-10-24 DIAGNOSIS — F17200 Nicotine dependence, unspecified, uncomplicated: Secondary | ICD-10-CM

## 2015-10-24 DIAGNOSIS — I6789 Other cerebrovascular disease: Secondary | ICD-10-CM

## 2015-10-24 LAB — BASIC METABOLIC PANEL
Anion gap: 9 (ref 5–15)
BUN: 11 mg/dL (ref 6–20)
CO2: 24 mmol/L (ref 22–32)
CREATININE: 1.12 mg/dL (ref 0.61–1.24)
Calcium: 9.4 mg/dL (ref 8.9–10.3)
Chloride: 107 mmol/L (ref 101–111)
Glucose, Bld: 89 mg/dL (ref 65–99)
Potassium: 4.1 mmol/L (ref 3.5–5.1)
SODIUM: 140 mmol/L (ref 135–145)

## 2015-10-24 LAB — ECHOCARDIOGRAM COMPLETE
CHL CUP MV DEC (S): 264
E/e' ratio: 13.91
EWDT: 264 ms
FS: 44 % (ref 28–44)
Height: 69 in
IVS/LV PW RATIO, ED: 0.94
LA diam end sys: 42 mm
LADIAMINDEX: 2.19 cm/m2
LASIZE: 42 mm
LAVOL: 65.6 mL
LAVOLA4C: 67 mL
LAVOLIN: 34.2 mL/m2
LV E/e' medial: 13.91
LV E/e'average: 13.91
LV e' LATERAL: 8.05 cm/s
LVOT area: 3.46 cm2
LVOT diameter: 21 mm
MV pk E vel: 112 m/s
MVPG: 5 mmHg
MVPKAVEL: 103 m/s
PW: 11.1 mm — AB (ref 0.6–1.1)
RV TAPSE: 18.5 mm
Reg peak vel: 341 cm/s
TDI e' lateral: 8.05
TDI e' medial: 5.22
TRMAXVEL: 341 cm/s
Weight: 2698.43 oz

## 2015-10-24 LAB — GLUCOSE, CAPILLARY
GLUCOSE-CAPILLARY: 125 mg/dL — AB (ref 65–99)
Glucose-Capillary: 81 mg/dL (ref 65–99)
Glucose-Capillary: 108 mg/dL — ABNORMAL HIGH (ref 65–99)
Glucose-Capillary: 131 mg/dL — ABNORMAL HIGH (ref 65–99)

## 2015-10-24 LAB — LIPID PANEL
CHOLESTEROL: 138 mg/dL (ref 0–200)
HDL: 43 mg/dL (ref >40)
LDL Cholesterol: 83 mg/dL (ref 0–99)
TRIGLYCERIDES: 59 mg/dL (ref <150)
Total CHOL/HDL Ratio: 3.2 RATIO
VLDL: 12 mg/dL (ref 0–40)

## 2015-10-24 MED ORDER — IOPAMIDOL (ISOVUE-370) INJECTION 76%: 50.0000 mL | Freq: Once | INTRAVENOUS | Status: DC | PRN

## 2015-10-24 MED ORDER — AMLODIPINE BESYLATE 10 MG PO TABS: 10.0000 mg | ORAL_TABLET | Freq: Every day | ORAL | Status: DC

## 2015-10-24 MED ORDER — ASPIRIN EC 81 MG PO TBEC
81.0000 mg | DELAYED_RELEASE_TABLET | Freq: Every day | ORAL | Status: DC
  Administered 2015-10-24: 81 mg via ORAL
  Filled 2015-10-24: qty 1

## 2015-10-24 MED ORDER — ATORVASTATIN CALCIUM 40 MG PO TABS
40.0000 mg | ORAL_TABLET | Freq: Every day | ORAL | Status: DC
  Administered 2015-10-24: 40 mg via ORAL
  Filled 2015-10-24: qty 1

## 2015-10-24 MED ORDER — IOPAMIDOL (ISOVUE-370) INJECTION 76%
INTRAVENOUS | Status: AC
  Administered 2015-10-24: 50 mL
  Filled 2015-10-24: qty 50

## 2015-10-24 MED ORDER — ATORVASTATIN CALCIUM 40 MG PO TABS: 40.0000 mg | ORAL_TABLET | Freq: Every day | ORAL | Status: DC

## 2015-10-24 MED ORDER — ASPIRIN EC 325 MG PO TBEC
325.0000 mg | DELAYED_RELEASE_TABLET | Freq: Every day | ORAL | Status: DC
  Administered 2015-10-25: 325 mg via ORAL
  Filled 2015-10-24 (×2): qty 1

## 2015-10-24 NOTE — Discharge Summary (Signed)
Name: Sean Collins MRN: [id number] DOB: 10-28-1950 65 y.o. PCP: Provider Default, MD  Date of Admission: 10/23/2015  2:49 PM Date of Discharge: 11/08/2015 Attending Physician: Oval Linsey M.D.  Discharge Diagnosis:  Acute Right MCA territory lenticulostriate infarct UGIB Anemia Dysphagia Dysarthria Hypertension Diabetes Hyperlipidemia   Discharge Medications:   Medication List    STOP taking these medications        clopidogrel 75 MG tablet  Commonly known as:  PLAVIX     omeprazole 20 MG capsule  Commonly known as:  PRILOSEC      TAKE these medications        allopurinol 300 MG tablet  Commonly known as:  ZYLOPRIM  Take 300 mg by mouth daily.     amLODipine 10 MG tablet  Commonly known as:  NORVASC  Take 1 tablet (10 mg total) by mouth daily.     aspirin 325 MG EC tablet  Take 1 tablet (325 mg total) by mouth daily.     atenolol 50 MG tablet  Commonly known as:  TENORMIN  Take 1 tablet (50 mg total) by mouth 2 (two) times daily.     atorvastatin 40 MG tablet  Commonly known as:  LIPITOR  Take 1 tablet (40 mg total) by mouth daily at 6 PM.     carboxymethylcellulose 0.5 % Soln  Commonly known as:  REFRESH PLUS  Place 1 drop into both eyes 3 (three) times daily as needed (dry eyes).     cholecalciferol 1000 units tablet  Commonly known as:  VITAMIN D  Take 1,000 Units by mouth daily.     enalapril 20 MG tablet  Commonly known as:  VASOTEC  Take 1 tablet (20 mg total) by mouth daily.     feeding supplement (JEVITY 1.2 CAL) Liqd  Place 1,000 mLs into feeding tube continuous.     folic acid 1 MG tablet  Commonly known as:  FOLVITE  Take 1 mg by mouth daily.     pantoprazole sodium 40 mg/20 mL Pack  Commonly known as:  PROTONIX  Place 20 mLs (40 mg total) into feeding tube 2 (two) times daily.        Disposition and follow-up:   Mr.Sean Collins was discharged from [facility] in Good condition.  At the  hospital follow up visit please address:  1.  CVA: Hold his aspirin and plavix due to his recent GI bleed. He can resume Aspirin 325mg  tomorrow if he don't have another episode of bloody diarrhea. He will need a neurology follow to decide on Plavix.  2: GI BLEED: RESOLVED. Needs CBC within 1 week  3: Anemia: It's already improving. Can be followed up with cbc check.  4: Dysphagia: Continue with peg tube feeding.  5: Dysarthria: Improving, may benefit with continuation of SLP  6: Hypertension: Should resume his home med. For his BP. Amlodipine 10 mg, and Enalapril 20 mg daily.  7: Hyperlipidemia: Due to his PMH of CVA , should continue to follow healthy diet with lipitor 40mg  daily.  8: Diabetes; As it's diet controlled. Should follow for diabetic diet and check his blood sugar periodically.  Fall precautions.  2.  Labs / imaging needed at time of follow-up: Check CBC in one week.  3.  Pending labs/ test needing follow-up: None  Follow-up Appointments: Follow-up Information    Schedule an appointment as soon as possible for a visit with [facility].   Specialty:  General Practice  Contact information:   [place] [id number] [phone]       Follow up with Ruta Hinds, MD In 4 weeks.   Specialties:  Vascular Surgery, Cardiology   Why:  Office will call you to arrange your appt (sent)   Contact information:   [place] [address] [phone]       Follow up with Xu,Jindong, MD. Schedule an appointment as soon as possible for a visit in 2 months.   Specialty:  Neurology   Why:  stroke clinic   Contact information:   [place] [address] [phone]       Discharge Instructions: Discharge Instructions    Ambulatory referral to Neurology    Complete by:  As directed   Pt will follow up with Dr. Erlinda Hong at [facility] in about 2 months. Thanks.     Diet - low sodium heart healthy    Complete by:  As  directed      Discharge instructions    Complete by:  As directed   It was pleasure taking care of you during your hospital stay. Please start your Aspirin and follow up with your Neurologist.     Increase activity slowly    Complete by:  As directed            Consultations: Treatment Team:  Elam Dutch, MD  Procedures Performed:  Ct Abdomen Wo Contrast  11/03/2015  CLINICAL DATA:  Bleeding from peg tube insertion site. EXAM: CT ABDOMEN WITHOUT CONTRAST TECHNIQUE: Multidetector CT imaging of the abdomen was performed following the standard protocol without IV contrast. COMPARISON:  CT scan 10/27/2015 FINDINGS: Lower chest: The lung bases demonstrate dependent atelectasis/ edema. Stable peribronchial thickening and probable scarring changes. No pleural effusion. The heart is normal in size. Extensive coronary artery calcifications. Hepatobiliary: No focal hepatic lesions. The gallbladder appears normal. No common bile duct dilatation. Pancreas: No mass, inflammation or ductal dilatation. Spleen: Normal size.  No focal lesions. Adrenals/Urinary Tract: The adrenal glands and kidneys are grossly normal. Stomach/Bowel: The feeding tube is normally positioned in the antral region of the stomach. No complicating features are demonstrated. No surrounding fluid collections or hematoma. The transverse colon is normal. The small bowel is unremarkable. Vascular/Lymphatic: Advanced atherosclerotic calcifications involving the aorta and branch vessels. No aneurysm. IVC filter is noted. No mesenteric or retroperitoneal mass or adenopathy. Other: No ascites or free air. Musculoskeletal: No significant bony findings. IMPRESSION: Peg tube appears to be in good position without complicating features. No acute abdominal findings. Electronically Signed   By: Marijo Sanes M.D.   On: 11/03/2015 12:05   Ct Abdomen Wo Contrast  10/27/2015  CLINICAL DATA:  Evaluate for gastrostomy tube EXAM: CT ABDOMEN WITHOUT  CONTRAST TECHNIQUE: Multidetector CT imaging of the abdomen was performed following the standard protocol without IV contrast. COMPARISON:  None. FINDINGS: Lower chest: There is bibasilar linear nodular thickening medially suggesting aspiration pneumonitis Hepatobiliary: No focal hepatic lesion on noncontrast exam. Normal gallbladder Pancreas: Pancreas is normal. No ductal dilatation. No pancreatic inflammation. Spleen: Normal spleen Adrenals/urinary tract: Adrenal glands and kidneys are normal. The ureters and bladder are normal. Stomach/Bowel: There is a feeding tube within the gastric body and fundus. The stomach is nondistended and in typical orientation. Limited view of the bowel is unremarkable. Vascular/Lymphatic: Abdominal or is normal caliber. Infrarenal IVC filter noted. Intimal calcification aorta. Other: No free fluid. Musculoskeletal: No aggressive osseous lesion. IMPRESSION: 1. Feeding tube extend into  the gastric fundus. Stomach in typical orientation without obstruction. 2. Bibasilar linear nodular thickening in the medial lung bases is suggestive of aspiration pneumonitis. 3. Atherosclerotic calcification aorta. Electronically Signed   By: Suzy Bouchard M.D.   On: 10/27/2015 16:49   Dg Chest 2 View  10/26/2015  CLINICAL DATA:  Dysphagia.  Cough and congestion today. EXAM: CHEST  2 VIEW COMPARISON:  None. FINDINGS: Cardiomediastinal silhouette is normal. Mediastinal contours appear intact. There is no evidence of pneumothorax. There are low lung volumes. Linear peribronchovascular and patchy airspace consolidation is seen in the left lower lobe. More subtle consolidation is noted in the right lower lobe. Osseous structures are without acute abnormality. Soft tissues are grossly normal. IMPRESSION: Low lung volumes. Bilateral lower lobes airspace consolidation, worse on the left, concerning for pneumonia. Given the distribution of airspace consolidation, aspiration pneumonia becomes a leading  consideration. Electronically Signed   By: Fidela Salisbury M.D.   On: 10/26/2015 10:33   Dg Abd 1 View  11/01/2015  CLINICAL DATA:  PEG tube placement EXAM: ABDOMEN - 1 VIEW COMPARISON:  10/29/2015 FINDINGS: Scattered contrast throughout nondistended colon. Feeding tube projects over third portion of duodenum. Nonobstructive bowel gas pattern. IVC filter noted. Scattered atherosclerotic calcifications including aorta. Osseous demineralization with LEFT hip prosthesis. IMPRESSION: Scattered contrast throughout colon. Aortic atherosclerosis. Electronically Signed   By: Lavonia Dana M.D.   On: 11/01/2015 07:54   Ct Head Wo Contrast  10/23/2015  CLINICAL DATA:  Patient with sudden onset slurred speech and dizziness. Code stroke. EXAM: CT HEAD WITHOUT CONTRAST TECHNIQUE: Contiguous axial images were obtained from the base of the skull through the vertex without intravenous contrast. COMPARISON:  MRI brain 01/23/2015; CT brain 01/22/2015. FINDINGS: Ventricles and sulci are prominent for patient's age. Old left frontal lobe encephalomalacia. Periventricular and subcortical white matter hypodensity compatible with chronic microvascular ischemic changes. No evidence for large territory infarct, intracranial hemorrhage, mass lesion or mass-effect. Orbits are unremarkable. Ethmoid air cell mucosal thickening. Mastoid air cells unremarkable. Calvarium is intact. IMPRESSION: No acute intracranial process. Chronic microvascular ischemic changes and cortical atrophy. Critical Value/emergent results were called by telephone at the time of interpretation on 10/23/2015 at 3:03 pm to Dr. Barbee Cough, who verbally acknowledged these results. Electronically Signed   By: Lovey Newcomer M.D.   On: 10/23/2015 15:12   Ct Angio Neck W Or Wo Contrast  10/24/2015  CLINICAL DATA:  Stroke.  Right MCA infarct. EXAM: CT ANGIOGRAPHY NECK TECHNIQUE: Multidetector CT imaging of the neck was performed using the standard protocol during bolus  administration of intravenous contrast. Multiplanar CT image reconstructions and MIPs were obtained to evaluate the vascular anatomy. Carotid stenosis measurements (when applicable) are obtained utilizing NASCET criteria, using the distal internal carotid diameter as the denominator. CONTRAST:  50 mL Isovue 370 IV COMPARISON:  MRI head 10/23/2015 FINDINGS: Aortic arch: Atherosclerotic calcification in the aortic arch. Negative for aneurysm or dissection. Atherosclerotic calcification the proximal great vessels which are widely patent without significant stenosis. Lung apices clear. Right carotid system: Right common carotid artery widely patent. Atherosclerotic calcification proximal right internal carotid artery narrowing the lumen by 50% diameter stenosis. Additional noncalcified stenosis 15 mm distal to the bifurcation also narrowing the lumen by 50% diameter stenosis. Right external carotid artery mild stenosis proximally. Left carotid system: Mild atherosclerotic disease in the left common carotid artery without significant stenosis. Mild atherosclerotic calcification proximal left internal carotid artery. 40% diameter stenosis proximal left internal carotid artery. Left external carotid artery widely patent.  Vertebral arteries:Mild stenosis proximal right vertebral artery. Remainder of the right vertebral artery is patent to the basilar. Mild stenosis proximal left vertebral artery without additional stenosis. Left vertebral artery patent to the basilar. Skeleton: Disc degeneration and spondylosis. Facet degeneration. No fracture or acute skeletal abnormality. Other neck: No adenopathy in the neck. IMPRESSION: 50% diameter stenosis at the origin of the right internal carotid artery. 15 mm distally there is another 50% diameter stenosis due to noncalcified atherosclerotic plaque. 40% diameter stenosis proximal left internal carotid artery Mild stenosis at the proximal vertebral artery bilaterally. Both  vertebral arteries patent. Electronically Signed   By: Franchot Gallo M.D.   On: 10/24/2015 18:02   Mr Brain Wo Contrast  10/25/2015  CLINICAL DATA:  65 year old hypertensive diabetic male with hyperlipidemia presenting with worsening of left-sided deficits and slurred speech. Subsequent encounter. EXAM: MRI HEAD WITHOUT CONTRAST TECHNIQUE: Multiplanar, multiecho pulse sequences of the brain and surrounding structures were obtained without intravenous contrast. COMPARISON:  10/24/2015 CT angiogram head and neck. 10/23/2015 in 01/2016 2016 brain MR. FINDINGS: Interval enlargement of right posterior corona radiata/ posterior right lenticular nucleus nonhemorrhagic infarct. Minimal deposition lenticular nucleus bilaterally similar to the prior exam. Remote left frontal lobe infarct with encephalomalacia. Remote corona radiata and basal ganglia infarcts bilaterally. Moderate global atrophy without hydrocephalus. Major intracranial vascular structures are patent. No intracranial mass lesion noted on this unenhanced exam. Complex persistent opacification mid to posterior left ethmoid sinus air cells. Mild mucosal thickening remainder of ethmoid sinus air cells. Minimal to mild mucosal thickening frontal sinuses. Minimal maxillary sinus mucosal thickening. IMPRESSION: Interval enlargement of right posterior corona radiata/ posterior right lenticular nucleus nonhemorrhagic infarct. Remote left frontal lobe infarct with encephalomalacia. Remote corona radiata and basal ganglia infarcts bilaterally. Moderate global atrophy without hydrocephalus. Complex persistent opacification mid to posterior left ethmoid sinus air cells. Mild mucosal thickening remainder of ethmoid sinus air cells. Minimal to mild mucosal thickening frontal sinuses. Minimal maxillary sinus mucosal thickening. Electronically Signed   By: Genia Del M.D.   On: 10/25/2015 12:18   Mr Brain Wo Contrast  10/23/2015  CLINICAL DATA:  Dysarthria which  began earlier today. Associated lightheadedness and gait instability. EXAM: MRI HEAD WITHOUT CONTRAST MRA HEAD WITHOUT CONTRAST TECHNIQUE: Multiplanar, multiecho pulse sequences of the brain and surrounding structures were obtained without intravenous contrast. Angiographic images of the head were obtained using MRA technique without contrast. COMPARISON:  CT head earlier today. MRI head 01/23/2015. MRI dated 11/16/2011. FINDINGS: MRI HEAD FINDINGS There is acute, RIGHT MCA territory lenticulostriate infarct affecting the lateral lentiform nucleus, and periventricular white matter. See for instance image 33 series 3, and image 20 series 9. No associated hemorrhage. No mass lesion, or extra-axial fluid. Global atrophy. Hydrocephalus ex vacuo. Extensive chronic microvascular ischemic change. Multiple hemorrhagic and nonhemorrhagic chronic lacunar infarcts, with scattered areas of susceptibility most notable in the RIGHT posterior lentiform nucleus, LEFT lateral lentiform nucleus, and BILATERAL subcortical white matter. Ovoid focus susceptibility LEFT frontal lobe superiorly, with encephalomalacia, can be seen as far back as 2013, potentially an area of old trauma. No midline abnormality. RIGHT cataract extraction appears uncomplicated. Possible mucocele of the LEFT ethmoid air cells, incompletely evaluated. No middle ear or mastoid fluid. MRA HEAD FINDINGS Dolichoectatic but widely patent LEFT internal carotid artery. Mild non stenotic atheromatous change inferior cavernous segment RIGHT internal carotid artery otherwise widely patent vessel. Basilar artery minimally irregular in its proximal segment, with both vertebrals contributing, RIGHT dominant. Mild non stenotic atheromatous change above stools. Dolichoectatic,  slightly irregular, but non stenotic M1 MCA segments. BILATERAL M2 and M3 vessel irregularity, consistent with intracranial atherosclerotic disease. 75% stenosis A1 ACA on the LEFT. Mild non stenotic  irregularity of the RIGHT A1 ACA both distal anterior cerebral arteries are patent. No proximal posterior cerebral artery flow limiting stenosis. 75% stenosis of the proximal P2 segment on the LEFT. Mild irregularity of the LEFT greater than RIGHT superior cerebellar arteries. Anterior inferior cerebellar arteries poorly visualized. RIGHT posterior inferior cerebellar artery robust ; LEFT is moderately diseased. IMPRESSION: Acute RIGHT MCA territory lenticulostriate infarct, nonhemorrhagic. Atrophy and small vessel disease. Scattered foci of chronic hemorrhagic and nonhemorrhagic lacunar infarcts. Incompletely evaluated LEFT ethmoid air cell opacity, possible developing mucocele. No proximal large vessel occlusion or flow-limiting stenosis. Intracranial atherosclerotic change most notable in the LEFT A1 ACA and LEFT P2 PCA, as well as BILATERAL MCA M2 and M3 vessels. Electronically Signed   By: Staci Righter M.D.   On: 10/23/2015 19:24   Ir Gastrostomy Tube Mod Sed  11/02/2015  INDICATION: History of stroke, now with dysphagia. Request made for percutaneous gastrostomy tube placement for enteric nutrition supplementation. EXAM: PULL TROUGH GASTROSTOMY TUBE PLACEMENT COMPARISON:  Abdominal radiograph - 11/01/2015; CT abdomen pelvis - 10/27/2015 MEDICATIONS: Ancef 2 gm IV; Antibiotics were administered within 1 hour of the procedure. Glucagon 1 mg IV CONTRAST:  20 mL of Isovue 300 administered into the gastric lumen. ANESTHESIA/SEDATION: Moderate (conscious) sedation was employed during this procedure. A total of Fentanyl 100 mcg was administered intravenously. Moderate Sedation Time: 8 minutes. The patient's level of consciousness and vital signs were monitored continuously by radiology nursing throughout the procedure under my direct supervision. FLUOROSCOPY TIME:  2 minutes 48 seconds (15 mGy) COMPLICATIONS: None immediate. PROCEDURE: Informed written consent was obtained from the patient's family following  explanation of the procedure, risks, benefits and alternatives. A time out was performed prior to the initiation of the procedure. Ultrasound scanning was performed to demarcate the edge of the left lobe of the liver. Maximal barrier sterile technique utilized including caps, mask, sterile gowns, sterile gloves, large sterile drape, hand hygiene and Betadine prep. The left upper quadrant was sterilely prepped and draped. An oral gastric catheter was inserted into the stomach under fluoroscopy. The existing nasogastric feeding tube was removed. The left costal margin and air / barium opacified transverse colon were identified and avoided. Air was injected into the stomach for insufflation and visualization under fluoroscopy. Under sterile conditions a 17 gauge trocar needle was utilized to access the stomach percutaneously beneath the left subcostal margin after the overlying soft tissues were anesthetized with 1% Lidocaine with epinephrine. Needle position was confirmed within the stomach with aspiration of air and injection of small amount of contrast. A single T tack was deployed for gastropexy. Over an Amplatz guide wire, a 9-French sheath was inserted into the stomach. A snare device was utilized to capture the oral gastric catheter. The snare device was pulled retrograde from the stomach up the esophagus and out the oropharynx. The 20-French pull-through gastrostomy was connected to the snare device and pulled antegrade through the oropharynx down the esophagus into the stomach and then through the percutaneous tract external to the patient. The gastrostomy was assembled externally. Contrast injection confirms position in the stomach. Several spot radiographic images were obtained in various obliquities for documentation. The patient tolerated procedure well without immediate post procedural complication. FINDINGS: After successful fluoroscopic guided placement, the gastrostomy tube is appropriately positioned  with internal disc against the ventral  aspect of the gastric lumen. IMPRESSION: Successful fluoroscopic insertion of a 20-French pull-through gastrostomy tube. The gastrostomy may be used immediately for medication administration and in 24 hrs for the initiation of feeds. Electronically Signed   By: Sandi Mariscal M.D.   On: 11/02/2015 10:27   Dg Chest Port 1 View  10/27/2015  CLINICAL DATA:  Cough. EXAM: PORTABLE CHEST 1 VIEW COMPARISON:  Radiograph of October 26, 2015. FINDINGS: The heart size and mediastinal contours are within normal limits. No pneumothorax is noted. Mild bibasilar subsegmental atelectasis is noted. No significant pleural effusion is noted. Distal tip of feeding tube is seen in proximal stomach. Atherosclerosis of thoracic aorta is noted. The visualized skeletal structures are unremarkable. IMPRESSION: Mild bibasilar subsegmental atelectasis.  Aortic atherosclerosis. Electronically Signed   By: Marijo Conception, M.D.   On: 10/27/2015 12:23   Dg Abd Portable 1v  11/02/2015  CLINICAL DATA:  Abdominal pain. EXAM: PORTABLE ABDOMEN - 1 VIEW COMPARISON:  None. FINDINGS: A percutaneous enteric tube projects over the left upper quadrant, likely entering the stomach. An IVC filter is identified. Contrast is seen in the colon, suggesting diverticulosis. No other acute abnormalities. IMPRESSION: No acute abnormalities. Electronically Signed   By: Dorise Bullion III M.D   On: 11/02/2015 23:52   Dg Abd Portable 1v  10/29/2015  CLINICAL DATA:  Enteric tube placement EXAM: PORTABLE ABDOMEN - 1 VIEW COMPARISON:  10/28/2015 abdominal radiograph FINDINGS: Weighted enteric tube tip is at the junction of the second and third portions of the duodenum. IVC filter is in place in the medial right abdomen. Partially visualized left hip hemiarthroplasty. No dilated small bowel loops. Mild colorectal stool volume. No evidence of pneumatosis or pneumoperitoneum. IMPRESSION: Weighted enteric tube tip is at the junction  of the second and third portions of the duodenum. Nonobstructive bowel gas pattern. Electronically Signed   By: Ilona Sorrel M.D.   On: 10/29/2015 18:47   Dg Abd Portable 1v  10/28/2015  CLINICAL DATA:  Patient status post feeding tube placement. EXAM: PORTABLE ABDOMEN - 1 VIEW COMPARISON:  CT abdomen pelvis 10/27/2015. FINDINGS: Weighted feeding tube tip projects over the fourth portion of the duodenum, within the left upper quadrant. IVC filter. Nonobstructed bowel gas pattern. Left hip arthroplasty. Lumbar spine degenerative changes. IMPRESSION: Weighted feeding tube tip projects over the left upper quadrant, likely within the fourth portion of the duodenum. Electronically Signed   By: Lovey Newcomer M.D.   On: 10/28/2015 14:19   Dg Abd Portable 1v  10/26/2015  CLINICAL DATA:  Encounter for feeding tube placement; EXAM: PORTABLE ABDOMEN - 1 VIEW COMPARISON:  10/26/2015 FINDINGS: Feeding tube extends into the first portion duodenum/gastric antrum. Bibasilar lung opacities. IMPRESSION: Feeding tube in gastric antrum/ first portion duodenum. Electronically Signed   By: Suzy Bouchard M.D.   On: 10/26/2015 16:45   Mr Jodene Nam Head/brain Wo Cm  10/23/2015  CLINICAL DATA:  Dysarthria which began earlier today. Associated lightheadedness and gait instability. EXAM: MRI HEAD WITHOUT CONTRAST MRA HEAD WITHOUT CONTRAST TECHNIQUE: Multiplanar, multiecho pulse sequences of the brain and surrounding structures were obtained without intravenous contrast. Angiographic images of the head were obtained using MRA technique without contrast. COMPARISON:  CT head earlier today. MRI head 01/23/2015. MRI dated 11/16/2011. FINDINGS: MRI HEAD FINDINGS There is acute, RIGHT MCA territory lenticulostriate infarct affecting the lateral lentiform nucleus, and periventricular white matter. See for instance image 33 series 3, and image 20 series 9. No associated hemorrhage. No mass lesion, or extra-axial fluid.  Global atrophy.  Hydrocephalus ex vacuo. Extensive chronic microvascular ischemic change. Multiple hemorrhagic and nonhemorrhagic chronic lacunar infarcts, with scattered areas of susceptibility most notable in the RIGHT posterior lentiform nucleus, LEFT lateral lentiform nucleus, and BILATERAL subcortical white matter. Ovoid focus susceptibility LEFT frontal lobe superiorly, with encephalomalacia, can be seen as far back as 2013, potentially an area of old trauma. No midline abnormality. RIGHT cataract extraction appears uncomplicated. Possible mucocele of the LEFT ethmoid air cells, incompletely evaluated. No middle ear or mastoid fluid. MRA HEAD FINDINGS Dolichoectatic but widely patent LEFT internal carotid artery. Mild non stenotic atheromatous change inferior cavernous segment RIGHT internal carotid artery otherwise widely patent vessel. Basilar artery minimally irregular in its proximal segment, with both vertebrals contributing, RIGHT dominant. Mild non stenotic atheromatous change above stools. Dolichoectatic, slightly irregular, but non stenotic M1 MCA segments. BILATERAL M2 and M3 vessel irregularity, consistent with intracranial atherosclerotic disease. 75% stenosis A1 ACA on the LEFT. Mild non stenotic irregularity of the RIGHT A1 ACA both distal anterior cerebral arteries are patent. No proximal posterior cerebral artery flow limiting stenosis. 75% stenosis of the proximal P2 segment on the LEFT. Mild irregularity of the LEFT greater than RIGHT superior cerebellar arteries. Anterior inferior cerebellar arteries poorly visualized. RIGHT posterior inferior cerebellar artery robust ; LEFT is moderately diseased. IMPRESSION: Acute RIGHT MCA territory lenticulostriate infarct, nonhemorrhagic. Atrophy and small vessel disease. Scattered foci of chronic hemorrhagic and nonhemorrhagic lacunar infarcts. Incompletely evaluated LEFT ethmoid air cell opacity, possible developing mucocele. No proximal large vessel occlusion or  flow-limiting stenosis. Intracranial atherosclerotic change most notable in the LEFT A1 ACA and LEFT P2 PCA, as well as BILATERAL MCA M2 and M3 vessels. Electronically Signed   By: Staci Righter M.D.   On: 10/23/2015 19:24    2D Echo: TTE 10/24/15 - Left ventricle: The cavity size was normal. Wall thickness was  normal. Systolic function was normal. The estimated ejection  fraction was in the range of 55% to 60%. Wall motion was normal;  there were no regional wall motion abnormalities. Left  ventricular diastolic function parameters were normal. - Mitral valve: Calcified annulus. Mildly thickened leaflets . - Left atrium: The atrium was mildly dilated. - Right atrium: The atrium was mildly dilated. - Atrial septum: No defect or patent foramen ovale was identified. - Tricuspid valve: There was moderate regurgitation. - Pulmonary arteries: PA peak pressure: 55 mm Hg (S). - Pericardium, extracardiac: A trivial pericardial effusion was  identified.  Cardiac Cath: n/a  Admission HPI:  Mr. Javarie Sheard Auld is a 65 year old gentleman with PMH of multiple CVAs, HTN, HLD, diet controlled T2DM, and tobacco use who presents with dysarthria. 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 This was around 2 pm, per ED notes, patient unable to give estimate of time this began. He says he was able to speak his intended words, but his speech was dysarthric. He says this was similar to his previous strokes. He had associated lightheadedness and gait instability where he would "fade" to the left when trying to walk straight. He did not fall or lose consciousness. He did not have any chest pain, SOB, palpitations, change in vision, headache, focal weakness, numbness or tingling.  He was hospitalized for acute CVA in September 2016 where MRI showed multiple acute tiny right posterior frontal lobe infarcts in the MCA distribution. This was thought  secondary to large vessel atherosclerosis after workup. He was  discharged on 3 months dual antiplatelet (ASA & Plavix) with plan for Plavix alone afterwards. It is unclear if patient is taking both now, but states he has not missed any of his medications. He was started on Lipitor 20 mg. 2d echo with EF 55-60% with normal wall motion with mod TR. [facility].  In the ED, patient was slightly hypertensive at 150/81, bradycardic in the 50s. Workup revealed a slightly elevated SCr of 1.29 (1.2-1.5 over the last year), Hgb 12.2, MCV 82.6, negative point of care troponin, negative EtOH and UDS. UA shows >300 mg/dL proteinuria.  CT Head was negative for acute hemorrhage, mass, or intracranial process. Cortical atrophy and chronic microvascular ischemic changes are noted. He was evaluated by Neurology in the ED and not considered a candidate for TPA.   Social Hx: Smoked since age 56, 0.5 PPD with a 10 year break, current smoker; no alcohol or illicit drug use. [place] Veteran. Works as a Games developer.  Family Hx: DM in brother.  Hospital Course by problem list:   Acute Right MCA territory lenticulostriate infarct: Seen on MRI brain 6/17. TTE showed an EF of 55-60%, PA peak pressure 55 mmHg, no comment on thrombus. Carotid duplex showed Right ICA 40-59% stenosis, Left ICA 1-39% stenosis. LE dopplers without evidence for DVT or SVT. CTA neck showed 50% stenosis at origin of Right ICA as well as 15 mm distally. Also showed 40% stenosis at proximal Left ICA. CVA and recurrent strokes thought likely due to atherosclerosis. Patient experienced worsened deficits during hospital stay. Repeat MRI on 6/19 showed evolution of admission stroke, no new infarct. [facility] were consulted with plan for CEA prior to discharge if neurological function stabilized. Patient had continued functional decline with severe dysphagia and suspected aspiration pneumonia. NG tube  was placed for medication and nutrition. He has been afebrile, but white count is increased 12.4k. CXR suspicious for aspiration pneumonia. Later He then had a PEG tube placed (after a 6 day Plavix washout) by IR on 6/27. He subsequently had hematemesis (2 cups worth) and melena later that evening and was transferred to the ICU. He received 2 units PRBCs. GI performed an EGD on 6/28 which revealed active bleeding in the gastric body related to the PEG tube site. The bleeding resolved after the PEG tube site was tightened. His hemoglobin has remained stable in the 9 range and he was transferred back to [facility]MTS . After 2 days of him being stable we restart his Aspirin and Plavix but has one episode of bloody BM that night. Both were hold for one day with Aspirin to be resumed from tomorrow but we will let his Neurologist decide about restarting Plavix.  Discharge Vitals:   BP 143/87 mmHg  Pulse 72  Temp(Src) 98.3 F (36.8 C) (Oral)  Resp 18  Ht 5\' 9"  (1.753 m)  Wt 149 lb 9.6 oz (67.858 kg)  BMI 22.08 kg/m2  SpO2 100% Physical Exam  Pt. Was alert,and comfortable. Unable to assess his orientation as I can understand what he was trying to speak. EENT: Left facial drop. Neck: supple, no restricted movements. No thyromegaly Chest: Normal breath sounds. No rales or wheezing. CVS; S1/S2. Regular. No murmers. ABD: Soft, non tender. BS +VE Neuro: Alert with severe dysarthria. Follow commands. Have bilateral atrophy of intrinsic hand muscles LUE, 0/5 RUE; 4/5 LLE: 1/5  Extremities: Skin was warm and dry. No pedal oedema. Intact pulses bilaterally.  Discharge Labs:  Results for orders placed or performed during the hospital encounter of 10/23/15 (from the past 24 hour(s))  Glucose, capillary     Status: Abnormal   Collection Time: 11/07/15  8:26 PM  Result Value Ref Range   Glucose-Capillary 124 (H) 65 - 99 mg/dL   Comment 1 Notify RN    Comment 2 Document in Chart   Glucose, capillary      Status: None   Collection Time: 11/08/15 12:17 AM  Result Value Ref Range   Glucose-Capillary 87 65 - 99 mg/dL   Comment 1 Notify RN    Comment 2 Document in Chart   Glucose, capillary     Status: Abnormal   Collection Time: 11/08/15  4:02 AM  Result Value Ref Range   Glucose-Capillary 135 (H) 65 - 99 mg/dL   Comment 1 Notify RN    Comment 2 Document in Chart   CBC     Status: Abnormal   Collection Time: 11/08/15  5:26 AM  Result Value Ref Range   WBC 12.9 (H) 4.0 - 10.5 K/uL   RBC 3.35 (L) 4.22 - 5.81 MIL/uL   Hemoglobin 9.0 (L) 13.0 - 17.0 g/dL   HCT 28.5 (L) 39.0 - 52.0 %   MCV 85.1 78.0 - 100.0 fL   MCH 26.9 26.0 - 34.0 pg   MCHC 31.6 30.0 - 36.0 g/dL   RDW 14.9 11.5 - 15.5 %   Platelets 424 (H) 150 - 400 K/uL  Glucose, capillary     Status: Abnormal   Collection Time: 11/08/15  7:36 AM  Result Value Ref Range   Glucose-Capillary 114 (H) 65 - 99 mg/dL  Glucose, capillary     Status: Abnormal   Collection Time: 11/08/15 11:36 AM  Result Value Ref Range   Glucose-Capillary 125 (H) 65 - 99 mg/dL    Signed: Lorella Nimrod, MD 11/08/2015, 2:52 PM    Services Ordered on Discharge: None Equipment Ordered on Discharge: None

## 2015-10-24 NOTE — Progress Notes (Signed)
VASCULAR LAB PRELIMINARY  PRELIMINARY  PRELIMINARY  PRELIMINARY  Bilateral lower extremity venous duplex completed.    Preliminary report:  There is no DVT or SVT noted in the bilateral lower extremities.   Seydou Hearns, RVT 10/24/2015, 9:31 AM

## 2015-10-24 NOTE — Evaluation (Signed)
Physical Therapy Evaluation Patient Details Name: Sean Collins MRN: [id number] DOB: July 03, 1950 Today's Date: 10/24/2015   History of Present Illness  [name] is a 65 year old gentleman who presents with dysarthria. 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 He had associated lightheadedness and gait instability where he would "fade" to the left when trying to walk straight. MRI-Acute RIGHT MCA territory lenticulostriate infarct  PMHx-CVA in September 2016 where MRI showed multiple acute tiny right posterior frontal lobe infarcts in the MCA distribution, multiple CVAs, Lt hip fx, HTN, HLD, diet controlled T2DM, and tobacco use    Clinical Impression  Patient evaluated by Physical Therapy with no further acute PT needs identified. Patient could benefit from OT and Speech evaluations (he reports his speaking and his LUE are worse than prior to this event). All PT education has been completed and the patient has no further questions. PT is signing off. Thank you for this referral.     Follow Up Recommendations No PT follow up;Supervision - Intermittent (supervision due to decr speech (often unintelligible))    Equipment Recommendations  None recommended by PT    Recommendations for Other Services OT consult;Speech consult     Precautions / Restrictions Precautions Precautions: None      Mobility  Bed Mobility Overal bed mobility: Modified Independent                Transfers Overall transfer level: Independent Equipment used: None                Ambulation/Gait Ambulation/Gait assistance: Modified independent (Device/Increase time) Ambulation Distance (Feet): 250 Feet Assistive device: None Gait Pattern/deviations: WFL(Within Functional Limits) Gait velocity: decr Gait velocity interpretation: Below normal speed for age/gender General Gait Details: slight limp (reports present  since Lt hip fx); able to maintain path with head turns, no imbalance noted  Stairs Stairs: Yes Stairs assistance: Independent Stair Management: No rails;Forwards;Alternating pattern Number of Stairs: 5    Wheelchair Mobility    Modified Rankin (Stroke Patients Only) Modified Rankin (Stroke Patients Only) Pre-Morbid Rankin Score: No significant disability Modified Rankin: Moderate disability (due to speech and RUE deficits)     Balance Overall balance assessment: Independent                               Standardized Balance Assessment Standardized Balance Assessment : Berg Balance Test;Dynamic Gait Index Berg Balance Test Sit to Stand: Able to stand without using hands and stabilize independently Standing Unsupported: Able to stand safely 2 minutes Sitting with Back Unsupported but Feet Supported on Floor or Stool: Able to sit safely and securely 2 minutes Stand to Sit: Sits safely with minimal use of hands Transfers: Able to transfer safely, minor use of hands Standing Unsupported with Eyes Closed: Able to stand 10 seconds safely Standing Ubsupported with Feet Together: Able to place feet together independently and stand 1 minute safely From Standing, Reach Forward with Outstretched Arm: Can reach confidently >25 cm (10") From Standing Position, Pick up Object from Floor: Able to pick up shoe safely and easily From Standing Position, Turn to Look Behind Over each Shoulder: Looks behind one side only/other side shows less weight shift Turn 360 Degrees: Able to turn 360 degrees safely one side only in 4 seconds or less Standing Unsupported, Alternately Place Feet on Step/Stool: Able to stand independently and safely  and complete 8 steps in 20 seconds Standing Unsupported, One Foot in Front: Able to plae foot ahead of the other independently and hold 30 seconds Standing on One Leg: Tries to lift leg/unable to hold 3 seconds but remains standing independently Total  Score: 50 Dynamic Gait Index Level Surface: Mild Impairment Change in Gait Speed: Mild Impairment Gait with Horizontal Head Turns: Mild Impairment Gait with Vertical Head Turns: Mild Impairment Gait and Pivot Turn: Mild Impairment Steps: Normal       Pertinent Vitals/Pain Pain Assessment: No/denies pain    Home Living Family/patient expects to be discharged to:: Private residence Living Arrangements: Spouse/significant other Available Help at Discharge: Family;Friend(s);Available PRN/intermittently Type of Home: House Home Access: Level entry     Home Layout: One level Home Equipment: None      Prior Function Level of Independence: Independent         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   Dominant Hand: Right    Extremity/Trunk Assessment   Upper Extremity Assessment: Defer to OT evaluation (Lt weaker )           Lower Extremity Assessment: Overall WFL for tasks assessed (Lt slightly weaker; ?shorter than RLE)      Cervical / Trunk Assessment: Normal  Communication   Communication: Expressive difficulties  Cognition Arousal/Alertness: Awake/alert Behavior During Therapy: WFL for tasks assessed/performed Overall Cognitive Status: Within Functional Limits for tasks assessed                      General Comments      Exercises        Assessment/Plan    PT Assessment Patent does not need any further PT services  PT Diagnosis     PT Problem List    PT Treatment Interventions     PT Goals (Current goals can be found in the Care Plan section) Acute Rehab PT Goals Patient Stated Goal: retutrn to work PT Goal Formulation: All assessment and education complete, DC therapy    Frequency     Barriers to discharge        Co-evaluation               End of Session Equipment Utilized During Treatment: Gait belt Activity Tolerance: Patient tolerated treatment well Patient left: in chair;with call  bell/phone within reach Nurse Communication: Mobility status         Time: 1100-1123 PT Time Calculation (min) (ACUTE ONLY): 23 min   Charges:   PT Evaluation $PT Eval Low Complexity: 1 Procedure PT Treatments $Gait Training: 8-22 mins   PT G Codes:        Ryheem Jay 2015-11-04, 12:40 PM  Pager [phone]

## 2015-10-24 NOTE — Progress Notes (Signed)
  Echocardiogram 2D Echocardiogram has been performed.  Sean Collins 10/24/2015, 9:02 AM

## 2015-10-24 NOTE — Discharge Instructions (Signed)
Mr. Batz,  Your speech problems were due to a new stroke. We need to work on controlling your blood pressure and cholesterol to help prevent this from happening again.  It is VERY IMPORTANT that you quit smoking. This will help decrease the risk of having another stroke.  Please continue your following medications as prescribed: -Aspirin 325 mg daily -Plavix 75 mg daily -Lipitor (Atorvastatin) 40 mg daily  Please follow up with your doctors at the [place]. You may need to wear a 30 day monitor to check your heart for any irregular heart rhythm.   You Can Quit Smoking If you are ready to quit smoking or are thinking about it, congratulations! You have chosen to help yourself be healthier and live longer! There are lots of different ways to quit smoking. Nicotine gum, nicotine patches, a nicotine inhaler, or nicotine nasal spray can help with physical craving. Hypnosis, support groups, and medicines help break the habit of smoking. TIPS TO GET OFF AND STAY OFF CIGARETTES  Learn to predict your moods. Do not let a bad situation be your excuse to have a cigarette. Some situations in your life might tempt you to have a cigarette.  Ask friends and co-workers not to smoke around you.  Make your home smoke-free.  Never have "just one" cigarette. It leads to wanting another and another. Remind yourself of your decision to quit.  On a card, make a list of your reasons for not smoking. Read it at least the same number of times a day as you have a cigarette. Tell yourself everyday, "I do not want to smoke. I choose not to smoke."  Ask someone at home or work to help you with your plan to quit smoking.  Have something planned after you eat or have a cup of coffee. Take a walk or get other exercise to perk you up. This will help to keep you from overeating.  Try a relaxation exercise to calm you down and decrease your stress. Remember, you may be tense and nervous the first two weeks after you quit.  This will pass.  Find new activities to keep your hands busy. Play with a pen, coin, or rubber band. Doodle or draw things on paper.  Brush your teeth right after eating. This will help cut down the craving for the taste of tobacco after meals. You can try mouthwash too.  Try gum, breath mints, or diet candy to keep something in your mouth. IF YOU SMOKE AND WANT TO QUIT:  Do not stock up on cigarettes. Never buy a carton. Wait until one pack is finished before you buy another.  Never carry cigarettes with you at work or at home.  Keep cigarettes as far away from you as possible. Leave them with someone else.  Never carry matches or a lighter with you.  Ask yourself, "Do I need this cigarette or is this just a reflex?"  Bet with someone that you can quit. Put cigarette money in a piggy bank every morning. If you smoke, you give up the money. If you do not smoke, by the end of the week, you keep the money.  Keep trying. It takes 21 days to change a habit!  Talk to your doctor about using medicines to help you quit. These include nicotine replacement gum, lozenges, or skin patches.   This information is not intended to replace advice given to you by your health care provider. Make sure you discuss any questions you have with your  health care provider.   Document Released: 02/18/2009 Document Revised: 07/17/2011 Document Reviewed: 02/18/2009 Elsevier Interactive Patient Education [employer].

## 2015-10-24 NOTE — Progress Notes (Signed)
STROKE TEAM PROGRESS NOTE   HISTORY OF PRESENT ILLNESS (per record) Sean Collins is a 65 year old male with PMH of multiple CVAs, HTN, HLD, diet controlled T2DM, and tobacco use who presents with dysarthria. 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 This was around 2 pm, per ED notes, patient unable to give estimate of time this began. He says he was able to speak his intended words, but his speech was dysarthric. He says this was similar to his previous strokes. He had associated lightheadedness and gait instability where he would "fade" to the left when trying to walk straight. He did not fall or lose consciousness. He did not have any chest pain, SOB, palpitations, change in vision, headache, focal weakness, numbness or tingling.  He was hospitalized for acute CVA in September 2016 where MRI showed multiple acute tiny right posterior frontal lobe infarcts in the MCA distribution. This was thought secondary to large vessel atherosclerosis after workup. He was discharged on 3 months dual antiplatelet (ASA & Plavix) with plan for Plavix alone afterwards. It is unclear if patient is taking both now, but states he has not missed any of his medications. He was started on Lipitor 20 mg. 2d echo with EF 55-60% with normal wall motion with mod TR. [facility].  In the ED, patient was slightly hypertensive at 150/81, bradycardic in the 50s. Workup revealed a slightly elevated SCr of 1.29 (1.2-1.5 over the last year), Hgb 12.2, MCV 82.6, negative point of care troponin, negative EtOH and UDS. UA shows >300 mg/dL proteinuria.  CT Head was negative for acute hemorrhage, mass, or intracranial process. Cortical atrophy and chronic microvascular ischemic changes are noted. He was evaluated by Neurology in the ED and not considered a candidate for TPA.   SUBJECTIVE (INTERVAL HISTORY) No family is at  the bedside.  Overall he feels his condition is stable. He still has slurry speech. He stated that he seems taking plavix but not ASA at home. He just restarted ASA 2-3 days before this admission. [facility] and [facility].   OBJECTIVE Temp:  [97.9 F (36.6 C)-98.2 F (36.8 C)] 98 F (36.7 C) (06/18 1027) Pulse Rate:  [50-64] 57 (06/18 1027) Cardiac Rhythm:  [-] Sinus bradycardia (06/17 1631) Resp:  [18-23] 20 (06/18 1027) BP: (147-168)/(72-82) 147/72 mmHg (06/18 1027) SpO2:  [97 %-100 %] 100 % (06/18 1027) Weight:  [76.5 kg (168 lb 10.4 oz)] 76.5 kg (168 lb 10.4 oz) (06/17 1500)  CBC:   Recent Labs Lab 10/23/15 1442 10/23/15 1449  WBC 7.3  --   NEUTROABS 4.0  --   HGB 12.2* 13.9  HCT 37.5* 41.0  MCV 82.6  --   PLT 217  --     Basic Metabolic Panel:   Recent Labs Lab 10/23/15 1442 10/23/15 1449 10/24/15 0502  NA 137 141 140  K 4.0 4.0 4.1  CL 110 107 107  CO2 19*  --  24  GLUCOSE 95 89 89  BUN 15 18 11   CREATININE 1.29* 1.30* 1.12  CALCIUM 9.2  --  9.4    Lipid Panel:     Component Value Date/Time   CHOL 138 10/24/2015 0502   TRIG 59 10/24/2015 0502   HDL 43 10/24/2015 0502   CHOLHDL 3.2 10/24/2015 0502   VLDL 12 10/24/2015 0502   LDLCALC  83 10/24/2015 0502   HgbA1c:  Lab Results  Component Value Date   HGBA1C 6.3* 01/23/2015   Urine Drug Screen:     Component Value Date/Time   LABOPIA NONE DETECTED 10/23/2015 1530   COCAINSCRNUR NONE DETECTED 10/23/2015 1530   LABBENZ NONE DETECTED 10/23/2015 1530   AMPHETMU NONE DETECTED 10/23/2015 1530   THCU NONE DETECTED 10/23/2015 1530   LABBARB NONE DETECTED 10/23/2015 1530      IMAGING I have personally reviewed the radiological images below and agree with the radiology interpretations.  Ct Head Wo Contrast 10/23/2015   No acute intracranial process. Chronic microvascular ischemic changes and cortical atrophy.   Mr Jodene Nam Head/brain Wo Cm 10/23/2015   Acute  RIGHT MCA territory lenticulostriate infarct, nonhemorrhagic.  Atrophy and small vessel disease.  Scattered foci of chronic hemorrhagic and nonhemorrhagic lacunar infarcts.  Incompletely evaluated LEFT ethmoid air cell opacity, possible developing mucocele.  No proximal large vessel occlusion or flow-limiting stenosis.  Intracranial atherosclerotic change most notable in the LEFT A1 ACA and LEFT P2 PCA, as well as BILATERAL MCA M2 and M3 vessels.  CUS - Right: 40-59% ICA stenosis (mid to distal),highest end of scale, however, higher velocities may be obscured as patient has high bifurcation and significant plaquing. Left: 1-39% ICA stenosis. Unable to adequately visualize the distal ICA secondary to high bifurcation. Bilateral vertebral artery flow is antegrade.   LE venous doppler - There is no DVT or SVT noted in the bilateral lower extremities.   TTE - - Left ventricle: The cavity size was normal. Wall thickness was  normal. Systolic function was normal. The estimated ejection  fraction was in the range of 55% to 60%. Wall motion was normal;  there were no regional wall motion abnormalities. Left  ventricular diastolic function parameters were normal. - Mitral valve: Calcified annulus. Mildly thickened leaflets . - Left atrium: The atrium was mildly dilated. - Right atrium: The atrium was mildly dilated. - Atrial septum: No defect or patent foramen ovale was identified. - Tricuspid valve: There was moderate regurgitation. - Pulmonary arteries: PA peak pressure: 55 mm Hg (S). - Pericardium, extracardiac: A trivial pericardial effusion was  identified.    PHYSICAL EXAM Temp:  [97.7 F (36.5 C)-98.2 F (36.8 C)] 97.7 F (36.5 C) (06/18 1416) Pulse Rate:  [50-64] 59 (06/18 1416) Resp:  [16-23] 16 (06/18 1416) BP: (144-168)/(72-82) 144/74 mmHg (06/18 1416) SpO2:  [97 %-100 %] 97 % (06/18 1416)  General - Well nourished, well developed, in no apparent  distress.  Ophthalmologic - Fundi not visualized due to noncooperation.  Cardiovascular - Regular rate and rhythm.  Mental Status -  Level of arousal and orientation to time, place, and person were intact. Language including expression, naming, repetition, comprehension was assessed and found intact, but mild to moderate dysarthria. Fund of Knowledge was assessed and was intact.  Cranial Nerves II - XII - II - Visual field intact OU. III, IV, VI - Extraocular movements intact. V - Facial sensation intact bilaterally. VII - Facial movement intact bilaterally. VIII - Hearing & vestibular intact bilaterally. X - Palate elevates symmetrically, but mild to moderate dysarthria. XI - Chin turning & shoulder shrug intact bilaterally. XII - Tongue protrusion intact.  Motor Strength - The patient's strength was normal in all extremities except left UE deltoid 4/5 adn left hand dexterity difficulty and pronator drift was absent.  Bulk was normal and fasciculations were absent.   Motor Tone - Muscle tone was assessed at the neck and  appendages and was normal.  Reflexes - The patient's reflexes were 1+ in all extremities and he had no pathological reflexes.  Sensory - Light touch, temperature/pinprick were assessed and were symmetrical.    Coordination - The patient had normal movements in the hands with no ataxia or dysmetria.  Tremor was absent.  Gait and Station -  Not tested due to safety concerns.   ASSESSMENT/PLAN [name] is a 65 y.o. male with history of HTN, HLD, pre-DM, previous stroke, MI s/p stent, current smoker presenting with slurry speech and gait imbalance. He did not receive IV t-PA due to out of time window.   Stroke:  right BG/CR punctate infarcts, likely due to intracranial atherosclerosis  Resultant  Slurry speech  MRI  Right BG/CR punctate infarcts  MRA  Diffuse intracranial athero, right M2, BA, b/l PCA  Carotid Doppler  40-59% right ICA  CTA  neck pending  2D Echo  EF 55-60%  LE venous doppler negative for DVT  LDL 83  HgbA1c pending  lovenox subq for VTE prophylaxis Diet Carb Modified Fluid consistency:: Thin; Room service appropriate?: Yes  aspirin 81 mg daily and clopidogrel 75 mg daily prior to admission, now on aspirin 325 mg daily and clopidogrel 75 mg daily. Continue DAPT on discharge for both cardiac and stroke prevention.  Patient counseled to be compliant with his antithrombotic medications  Ongoing aggressive stroke risk factor management  Therapy recommendations:  pending  Disposition:  Pending  History of stroke  11/2011 - left CR - MRA diffuse athero - CUS and TTE negative - on ASA  01/2015 - right semi ovale punctate infarcts - MRA diffuse athero - CUS and TTE negative - LDL 180 and A1C 6.3 - ASA + plavix + zocor  [facility]  Hypertension  Stable  Permissive hypertension (OK if < 220/120) but gradually normalize in 5-7 days  Long-term BP goal normotensive  Hyperlipidemia  Home meds:  lipitor 60mg   LDL 83, goal < 70  Resume lipitor 40mg  daily  Continue statin at discharge  Tobacco abuse  Current smoker  Smoking cessation counseling provided  Pt is willing to quit  Other Stroke Risk Factors  Advanced age   Hx stroke/TIA  Coronary artery disease s/p stent  Other [facility] day # 1  Rosalin Hawking, MD PhD Stroke Neurology 10/24/2015 3:49 PM    To contact Stroke Continuity provider, please refer to [email or web address] After hours, contact General Neurology

## 2015-10-24 NOTE — Progress Notes (Signed)
   Subjective: Patient feels well this morning. He ate all of his breakfast, but reports some difficulty with initiating swallowing of his food. Otherwise he feels well.  Objective: Vital signs in last 24 hours: Filed Vitals:   10/24/15 0000 10/24/15 0215 10/24/15 0630 10/24/15 1027  BP: 163/82 158/76 153/80 147/72  Pulse: 58 64 59 57  Temp: 98 F (36.7 C) 98.2 F (36.8 C) 98.2 F (36.8 C) 98 F (36.7 C)  TempSrc: Oral Oral Oral Oral  Resp: 20 20 18 20   Height:      Weight:      SpO2: 98% 98% 99% 100%   Weight change:   Intake/Output Summary (Last 24 hours) at 10/24/15 1214 Last data filed at 10/24/15 [id number]  Gross per 24 hour  Intake   2120 ml  Output    500 ml  Net   1620 ml   General: resting in bed comfortably, no acute distress HEENT: EOMI, no scleral icterus Cardiac: bradycardia, no rubs, murmurs or gallops Pulm: clear to auscultation bilaterally, moving normal volumes of air Abd: soft, nontender, nondistended Ext: warm and well perfused, no pedal edema Neuro: alert and oriented X3, strength 5/5 all extremities, finger to nose intact, rapid finger tap intact, no facial droop, mild dysarthria/slowed speech   Assessment/Plan: Principal Problem:   CVA (cerebral infarction) Active Problems:   Hypertension associated with diabetes ([place])  Acute Right MCA territory lenticulostriate infarct: Seen on MRI brain. His medication list has him on aspirin and plavix, although he is not able to clearly tell us if he is on both or just one of these. Regardless, this is concerning that he has failed secondary prevention of stroke. TTE showed an EF of 55-60%, PA peak pressure 55 mmHg, no comment on thrombus. Preliminary carotid duplex shows Right ICA 40-59% stenosis, Left ICA 1-39% stenosis. Preliminary LE dopplers without DVT or SVT. He has known atherosclerosis which is the most likely cause for his recurrent strokes. -Neurology consulted, appreciate assistance and  recommendations -f/u Hgb A1C -Lipid panel within normal limits -f/u PT/OT/Speech eval -Permissive HTN <220/120 -Risk prevention (smoking cessation) -Continue ASA 81 mg and Plavix 75 mg indefinitely -30 day holter monitor outpatient   HTN: Takes Enalapril 20 mg qd, Atenolol 50 mg BID, Amlodipine 10 mg daily -Hold meds for permissive HTN, resume tomorrow  HLD: Takes Lipitor 20 mg  -Increase Lipitor to 40 mg  Proteinuria: >300 mg on UA. Already on an ACE-I. -Check microalbumin/creatinine urine ratio -f/u with PCP for further evaluation  Tobacco use: Patient now smoking 1 pack every two days. Acknowledges that he needs to quit. Quit cold [place] in the past. -Continue smoking cessation counseling   Dispo:  Anticipated discharge today pending PT/OT/SLP recommendations.   The patient does have a current PCP with the [facility] (Provider Default, MD) and does not need an [facility] hospital follow-up appointment after discharge.  The patient does not have transportation limitations that hinder transportation to clinic appointments.    LOS: 1 day   Zada Finders, MD 10/24/2015, 12:14 PM

## 2015-10-24 NOTE — Progress Notes (Signed)
VASCULAR LAB PRELIMINARY  PRELIMINARY  PRELIMINARY  PRELIMINARY  Carotid duplex completed.    Preliminary report:  Right: 40-59% ICA stenosis (mid to distal),highest end of scale, however, higher velocities may be obscured as patient has high bifurcation and significant plaquing.   Left: 1-39% ICA stenosis.  Unable to adequately visualize the distal ICA secondary to high bifurcation.  Bilateral vertebral artery flow is antegrade.   Marleta Lapierre, RVT 10/24/2015, 9:32 AM

## 2015-10-25 ENCOUNTER — Inpatient Hospital Stay (HOSPITAL_COMMUNITY): Payer: Medicare Other

## 2015-10-25 DIAGNOSIS — I63511 Cerebral infarction due to unspecified occlusion or stenosis of right middle cerebral artery: Secondary | ICD-10-CM

## 2015-10-25 DIAGNOSIS — R471 Dysarthria and anarthria: Secondary | ICD-10-CM

## 2015-10-25 DIAGNOSIS — E785 Hyperlipidemia, unspecified: Secondary | ICD-10-CM | POA: Diagnosis present

## 2015-10-25 DIAGNOSIS — I6521 Occlusion and stenosis of right carotid artery: Secondary | ICD-10-CM

## 2015-10-25 DIAGNOSIS — F17200 Nicotine dependence, unspecified, uncomplicated: Secondary | ICD-10-CM | POA: Diagnosis present

## 2015-10-25 LAB — GLUCOSE, CAPILLARY
GLUCOSE-CAPILLARY: 91 mg/dL (ref 65–99)
GLUCOSE-CAPILLARY: 90 mg/dL (ref 65–99)
Glucose-Capillary: 102 mg/dL — ABNORMAL HIGH (ref 65–99)
Glucose-Capillary: 91 mg/dL (ref 65–99)
Glucose-Capillary: 99 mg/dL (ref 65–99)

## 2015-10-25 LAB — MICROALBUMIN / CREATININE URINE RATIO
CREATININE, UR: 92.5 mg/dL
MICROALB/CREAT RATIO: 1185.3 mg/g{creat} — AB (ref 0.0–30.0)
Microalb, Ur: 1096.4 ug/mL — ABNORMAL HIGH

## 2015-10-25 LAB — HEMOGLOBIN A1C
HEMOGLOBIN A1C: 6.1 % — AB (ref 4.8–5.6)
Mean Plasma Glucose: 128 mg/dL

## 2015-10-25 MED ORDER — ASPIRIN 325 MG PO TBEC: 325.0000 mg | DELAYED_RELEASE_TABLET | Freq: Every day | ORAL | Status: DC

## 2015-10-25 NOTE — Consult Note (Signed)
[facility] [place]  Patient name: Sean Collins MRN: [id number] DOB: 02-02-1951 Sex: male  REASON FOR CONSULT: CVA, carotid stenosis  HPI: Sean Collins is a 65 y.o. male, who presents for evaluation of carotid stenosis. The patient presented to [facility] ED on 10/23/15 after he noticed he was unable to speak and had slurred speech. His history is difficult to obtain given current dysarthria. The patient denies any amaurosis fugax or hemiparesis. The patient states that he has had a stroke in the past but was unable to describe his symptoms. Per chart review, the patient experienced similar symptoms during his previous stroke.   The patient has a past medical history of hypertension managed on multiple antihypertensives. His hyperlipidemia is managed on a statin. He is a current smoker. His PTA meds included aspirin and Plavix but the patient is not able to say whether he took both. The patient works as a Presenter, broadcasting.   Past Medical History  Diagnosis Date  . Hypertension   . Hyperlipidemia   . Stroke ([place])   . Diabetes mellitus without complication ([place])     Family History  Problem Relation Age of Onset  . Diabetes Brother     SOCIAL HISTORY: Social History   Social History  . Marital Status: Legally Separated    Spouse Name: N/A  . Number of Children: N/A  . Years of Education: N/A   Occupational History  . Not on file.   Social History Main Topics  . Smoking status: Current Every Day Smoker -- 0.50 packs/day for 32 years    Types: Cigarettes  . Smokeless tobacco: Not on file     Comment: Started at age 52 though quit for 10 years at one point  . Alcohol Use: No  . Drug Use: No  . Sexual Activity: Not on file   Other Topics Concern  . 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 allopurinol (ZYLOPRIM) tablet 300 mg  300 mg Oral Daily Riccardo Dubin, MD   300 mg at 10/25/15 0937  . aspirin EC tablet 325 mg  325 mg Oral Daily Rosalin Hawking, MD   325 mg at 10/25/15 0937  . atorvastatin (LIPITOR) tablet 40 mg  40 mg Oral q1800 Zada Finders, MD   40 mg at 10/24/15 1754  . clopidogrel (PLAVIX) tablet 75 mg  75 mg Oral Daily Riccardo Dubin, MD   75 mg at 10/25/15 0937  . enoxaparin (LOVENOX) injection 40 mg  40 mg Subcutaneous Q24H Zada Finders, MD   40 mg at 10/24/15 1754  . pantoprazole (PROTONIX) EC tablet 40 mg  40 mg Oral Daily Riccardo Dubin, MD   40 mg at 10/25/15 0937  . senna-docusate (Senokot-S) tablet 1 tablet  1 tablet Oral QHS PRN Zada Finders, MD        REVIEW OF SYSTEMS:  [X]  denotes positive finding, [ ]  denotes negative finding Cardiac  Comments:  Chest pain or chest pressure:    Shortness of breath upon exertion:    Short of breath when lying flat:    Irregular heart rhythm:        Vascular  Pain in calf, thigh, or hip brought on by ambulation:    Pain in feet at night that wakes you up from your sleep:     Blood clot in your veins:    Leg swelling:         Pulmonary    Oxygen at home:    Productive cough:     Wheezing:         Neurologic    Sudden weakness in arms or legs:     Sudden numbness in arms or legs:     Sudden onset of difficulty speaking or slurred speech: x   Temporary loss of vision in one eye:     Problems with dizziness:         Gastrointestinal    Blood in stool:     Vomited blood:         Genitourinary    Burning when urinating:     Blood in urine:        Psychiatric    Major depression:         Hematologic    Bleeding problems:    Problems with blood clotting too easily:        Skin    Rashes or ulcers:        Constitutional    Fever or chills:      PHYSICAL EXAM: Filed Vitals:     10/25/15 0142 10/25/15 0545 10/25/15 0930 10/25/15 1309  BP: 142/79 138/84 159/74 157/81  Pulse: 64 68 71 65  Temp: 98 F (36.7 C) 97.9 F (36.6 C) 99 F (37.2 C) 98.7 F (37.1 C)  TempSrc: Oral Oral Oral Oral  Resp: 18 18 16 16   Height:      Weight:      SpO2: 98% 98% 95% 98%    GENERAL: The patient is weak appearing male, in no acute distress. The vital signs are documented above. CARDIAC: Irregularly irregular VASCULAR: No carotid bruits PULMONARY: Clear to auscultation MUSCULOSKELETAL: There are no major deformities or cyanosis. NEUROLOGIC: dysarthria, 5/5 strength right upper and lower extremity, 3/5 strength left upper extremity, 4/5 strength right lower extremity SKIN: There are no ulcers or rashes noted. PSYCHIATRIC: The patient has a flat affect.   DATA:   Carotid duplex 10/24/15 Preliminary report: Right: 40-59% ICA stenosis (mid to distal),highest end of scale, however, higher velocities may be obscured as patient has high bifurcation and significant plaquing. Left: 1-39% ICA stenosis. Unable to adequately visualize the distal ICA secondary to high bifurcation. Bilateral vertebral artery flow is antegrade.   Mr Brain Wo Contrast 10/25/2015 IMPRESSION: Interval enlargement of right posterior corona radiata/ posterior right lenticular nucleus nonhemorrhagic infarct. Remote left frontal lobe infarct with encephalomalacia. Remote corona radiata and basal ganglia infarcts bilaterally. Moderate global atrophy without hydrocephalus. Complex persistent opacification mid to posterior left ethmoid sinus air cells. Mild mucosal thickening remainder of ethmoid sinus air cells. Minimal to mild mucosal thickening frontal sinuses. Minimal maxillary sinus mucosal thickening.   Ct Angio Neck W Or Wo Contrast 10/24/2015 IMPRESSION: 50% diameter stenosis at the origin of the right internal carotid artery. 15 mm distally there is another 50% diameter stenosis due to noncalcified  atherosclerotic plaque. 40% diameter stenosis proximal left internal carotid artery. Mild stenosis at the proximal vertebral artery bilaterally. Both vertebral arteries patent.   TTE  - Left ventricle: The cavity size was normal. Wall thickness was normal. Systolic function was normal. The estimated ejection fraction was in the range of 55% to 60%.  Wall motion was normal; there were no regional wall motion abnormalities. Left ventricular diastolic function parameters were normal. - Mitral valve: Calcified annulus. Mildly thickened leaflets . - Left atrium: The atrium was mildly dilated. - Right atrium: The atrium was mildly dilated. - Atrial septum: No defect or patent foramen ovale was identified. - Tricuspid valve: There was moderate regurgitation. - Pulmonary arteries: PA peak pressure: 55 mm Hg (S). - Pericardium, extracardiac: A trivial pericardial effusion was identified.  MEDICAL ISSUES:  Acute right MCA infarct, Scattered foci of chronic hemorrhagic and nonhemorrhagic lacunar infarcts Right ICA stenosis 50%, left ICA 40%   The patient has had interval worsening of left upper and lower extremity weakness and dysarthria. His repeat MRI showed interval enlargement of right CVA. He has mild carotid stenosis bilaterally. Continue ASA, Plavix and statin. Dr. Oneida Alar to evaluate patient and make recommendations.    Virgina Jock, PA-C [facility] [place]   History and exam findings as above.  Pt currently with stroke in evolution clinically and by MRI.  Would benefit from right CEA however, needs to be neurologically stable prior to this.  Will follow as consult with plan to do CEA prior to d/c if pt stabilizes  Ruta Hinds, MD [facility] [place]: [phone] Pager: [phone]

## 2015-10-25 NOTE — Progress Notes (Addendum)
STROKE TEAM PROGRESS NOTE   SUBJECTIVE (INTERVAL HISTORY) Pt had worsening left UE and LE weakness as well as dysarthria. BP stable overnight, no hypotension episode. Repeat MRI showed more prominent right BG/CR infarct. CTA showed right ICA soft plaque with 50% stenosis. Consider VVS consult.   OBJECTIVE Temp:  [97.7 F (36.5 C)-99 F (37.2 C)] 99 F (37.2 C) (06/19 0930) Pulse Rate:  [56-71] 71 (06/19 0930) Cardiac Rhythm:  [-] Ventricular tachycardia (06/19 0700) Resp:  [16-18] 16 (06/19 0930) BP: (138-165)/(74-84) 159/74 mmHg (06/19 0930) SpO2:  [95 %-100 %] 95 % (06/19 0930)  CBC:   Recent Labs Lab 10/23/15 1442 10/23/15 1449  WBC 7.3  --   NEUTROABS 4.0  --   HGB 12.2* 13.9  HCT 37.5* 41.0  MCV 82.6  --   PLT 217  --     Basic Metabolic Panel:   Recent Labs Lab 10/23/15 1442 10/23/15 1449 10/24/15 0502  NA 137 141 140  K 4.0 4.0 4.1  CL 110 107 107  CO2 19*  --  24  GLUCOSE 95 89 89  BUN 15 18 11   CREATININE 1.29* 1.30* 1.12  CALCIUM 9.2  --  9.4    Lipid Panel:     Component Value Date/Time   CHOL 138 10/24/2015 0502   TRIG 59 10/24/2015 0502   HDL 43 10/24/2015 0502   CHOLHDL 3.2 10/24/2015 0502   VLDL 12 10/24/2015 0502   LDLCALC 83 10/24/2015 0502   HgbA1c:  Lab Results  Component Value Date   HGBA1C 6.1* 10/23/2015   Urine Drug Screen:     Component Value Date/Time   LABOPIA NONE DETECTED 10/23/2015 1530   COCAINSCRNUR NONE DETECTED 10/23/2015 1530   LABBENZ NONE DETECTED 10/23/2015 1530   AMPHETMU NONE DETECTED 10/23/2015 1530   THCU NONE DETECTED 10/23/2015 1530   LABBARB NONE DETECTED 10/23/2015 1530      IMAGING I have personally reviewed the radiological images below and agree with the radiology interpretations.  Ct Head Wo Contrast 10/23/2015   No acute intracranial process. Chronic microvascular ischemic changes and cortical atrophy.   Mri & Mra Head/brain Wo Cm 10/23/2015   Acute RIGHT MCA territory lenticulostriate  infarct, nonhemorrhagic. Atrophy and small vessel disease. Scattered foci of chronic hemorrhagic and nonhemorrhagic lacunar infarcts. Incompletely evaluated LEFT ethmoid air cell opacity, possible developing mucocele.  No proximal large vessel occlusion or flow-limiting stenosis. Intracranial atherosclerotic change most notable in the LEFT A1 ACA and LEFT P2 PCA, as well as BILATERAL MCA M2 and M3 vessels.  CTA neck 50% diameter stenosis at the origin of the right internal carotid artery. 15 mm distally there is another 50% diameter stenosis due to noncalcified atherosclerotic plaque. 40% diameter stenosis proximal left internal carotid artery. Mild stenosis at the proximal vertebral artery bilaterally. Both vertebral arteries patent.  CUS - Right: 40-59% ICA stenosis (mid to distal),highest end of scale, however, higher velocities may be obscured as patient has high bifurcation and significant plaquing. Left: 1-39% ICA stenosis. Unable to adequately visualize the distal ICA secondary to high bifurcation. Bilateral vertebral artery flow is antegrade.   LE venous doppler - There is no DVT or SVT noted in the bilateral lower extremities.   TTE  - Left ventricle: The cavity size was normal. Wall thickness was normal. Systolic function was normal. The estimated ejection fraction was in the range of 55% to 60%. Wall motion was normal; there were no regional wall motion abnormalities. Left ventricular diastolic function parameters were  normal. - Mitral valve: Calcified annulus. Mildly thickened leaflets . - Left atrium: The atrium was mildly dilated. - Right atrium: The atrium was mildly dilated. - Atrial septum: No defect or patent foramen ovale was identified. - Tricuspid valve: There was moderate regurgitation. - Pulmonary arteries: PA peak pressure: 55 mm Hg (S). - Pericardium, extracardiac: A trivial pericardial effusion was identified.  Ct Angio Neck W Or Wo Contrast 10/24/2015  IMPRESSION:  50% diameter stenosis at the origin of the right internal carotid artery. 15 mm distally there is another 50% diameter stenosis due to noncalcified atherosclerotic plaque. 40% diameter stenosis proximal left internal carotid artery. Mild stenosis at the proximal vertebral artery bilaterally. Both vertebral arteries patent.    Mr Brain Wo Contrast 10/25/2015  IMPRESSION: Interval enlargement of right posterior corona radiata/ posterior right lenticular nucleus nonhemorrhagic infarct. Remote left frontal lobe infarct with encephalomalacia. Remote corona radiata and basal ganglia infarcts bilaterally. Moderate global atrophy without hydrocephalus. Complex persistent opacification mid to posterior left ethmoid sinus air cells. Mild mucosal thickening remainder of ethmoid sinus air cells. Minimal to mild mucosal thickening frontal sinuses. Minimal maxillary sinus mucosal thickening.    PHYSICAL EXAM General - Well nourished, well developed, in no apparent distress.  Ophthalmologic - Fundi not visualized due to noncooperation.  Cardiovascular - Regular rate and rhythm.  Mental Status -  Level of arousal and orientation to time, place, and person were intact. Language including expression, naming, repetition, comprehension was assessed and found intact, moderate dysarthria. Fund of Knowledge was assessed and was intact.  Cranial Nerves II - XII - II - Visual field intact OU. III, IV, VI - Extraocular movements intact. V - Facial sensation intact bilaterally. VII - left nasolabial fold flattening. VIII - Hearing & vestibular intact bilaterally. X - Palate elevates symmetrically, moderate dysarthria. XI - Chin turning & shoulder shrug intact bilaterally. XII - Tongue protrusion intact.  Motor Strength - The patient's strength was left UE 3+/5 and LLE 4/5, RUE and RLE 5/5  and pronator drift was absent.  Bulk was normal and fasciculations were absent.   Motor Tone - Muscle tone was assessed at the  neck and appendages and was normal.  Reflexes - The patient's reflexes were 1+ in all extremities and he had no pathological reflexes.  Sensory - Light touch, temperature/pinprick were assessed and were symmetrical.    Coordination - The patient had normal movements in the hands with no ataxia or dysmetria.  Tremor was absent.  Gait and Station -  Not tested due to safety concerns.   ASSESSMENT/PLAN Mr. Mitcheal Rabbitt Gregory is a 65 y.o. male with history of HTN, HLD, pre-DM, previous stroke, MI s/p stent, current smoker presenting with slurry speech and gait imbalance. He did not receive IV t-PA due to out of time window.   Stroke:  right BG/CR punctate infarcts, likely due to intracranial atherosclerosis and right ICA stenosis. Repeat MRI showed more prominent right BG/CR infarct without hypotension.   Resultant  Worsening dysarthria and left hemiparesis  MRI  Right BG/CR punctate infarcts  Repeat MRI showed more prominent right BG/CR infarction  MRA  Diffuse intracranial athero, right M2, BA, b/l PCA  Carotid Doppler  40-59% right ICA  CTA neck R ICA 50% with soft plaque, L ICA 40%, mild B VA stenosis  2D Echo  EF 55-60%  LE venous doppler negative for DVT  Recommend vascular surgery consultation  LDL 83  HgbA1c 6.1  lovenox subq for VTE prophylaxis Diet NPO time  specified  aspirin 81 mg daily and clopidogrel 75 mg daily prior to admission, now on aspirin 325 mg daily and clopidogrel 75 mg daily. Continue DAPT on discharge for both cardiac and stroke prevention.  Patient counseled to be compliant with his antithrombotic medications  Ongoing aggressive stroke risk factor management  Therapy recommendations:  No PT  Disposition:  Pending  Right ICA stenosis  Carotid Doppler  40-59% right ICA  CTA neck R ICA 50% with soft plaque, L ICA 40%, mild B VA stenosis  With symptomatic right brain stroke, recommend vascular surgery consultation.  History of  stroke  11/2011 - left CR - MRA diffuse athero - CUS and TTE negative - on ASA  01/2015 - right semi ovale punctate infarcts - MRA diffuse athero - CUS and TTE negative - LDL 180 and A1C 6.3 - ASA + plavix + zocor  [facility]  Hypertension  Stable on the high side, no hypotension episode  Permissive hypertension (OK if < 220/120) but gradually normalize in 5-7 days  Long-term BP goal normotensive  Hyperlipidemia  Home meds:  lipitor 60mg   LDL 83, goal < 70  Resume lipitor 40mg  daily  Continue statin at discharge  Tobacco abuse  Current smoker  Smoking cessation counseling provided  Pt is willing to quit  Other Stroke Risk Factors  Advanced age   Hx stroke/TIA  Coronary artery disease s/p stent  Other Active Problems  Proteinuria  Hospital day # 2  The patient has worsening neuro symptoms and repeat MRI showed more prominent right BG/CR infarct without hypotension episode. Could be "stuttering stroke" or due to right ICA stenosis. VVS consult recommended. BP control, off BP meds at this time. Pt is at risk for recurrent strokes and TIAs and neurological worsening and he needs ongoing stroke evaluation and aggressive risk factor modification.   Rosalin Hawking, MD PhD Stroke Neurology 10/25/2015 11:15 AM    To contact Stroke Continuity provider, please refer to [email or web address] After hours, contact General Neurology

## 2015-10-25 NOTE — Evaluation (Signed)
Clinical/Bedside Swallow Evaluation Patient Details  Name: Sean Collins MRN: [id number] Date of Birth: 12-May-1950  Today's Date: 10/25/2015 Time: SLP Start Time (ACUTE ONLY): [id number] SLP Stop Time (ACUTE ONLY): 1455 SLP Time Calculation (min) (ACUTE ONLY): 10 min  Past Medical History:  Past Medical History  Diagnosis Date  . Hypertension   . Hyperlipidemia   . Stroke ([place])   . Diabetes mellitus without complication [facility])    Past Surgical History: History reviewed. No pertinent past surgical history. HPI:  Sean Collins is a 65 year old gentleman who presents with dysarthria. 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 He had associated lightheadedness and gait instability where he would "fade" to the left when trying to walk straight. MRI-Acute RIGHT MCA territory lenticulostriate infarct. Patient with worsening speech and LUE symptoms and a repeat MRI showed more prominent right BG/CR infarct. CTA showed right ICA soft plaque with 50% stenosis. PMHx-CVA in September 2016 where MRI showed multiple acute tiny right posterior frontal lobe infarcts in the MCA distribution, multiple CVAs, Lt hip fx, HTN, HLD, diet controlled T2DM, and tobacco use.   Assessment / Plan / Recommendation Clinical Impression  Patient pesents with severe oral motor deficits especially noted lingual deficits with left being greater than right.  Patient demonstrates poor oral control of saliva as well as boluses with impaired transit and oral holding.  Patient with poor coordination of swallow trigger with frequent verbal cues to attend to residue versus piecemeal swallows resulting delayed coughs that appeared weak and ineffective at clearing suspected aspirates.  Recommend an objective swallow assessment to determine safest PO option.  MBS scheduled for 6/20.      Aspiration Risk  Severe aspiration risk    Diet Recommendation  NPO;Alternative means - temporary   Medication Administration: Via alternative means         Follow up Recommendations  Defer until after objective assessment scheduled for 6/20                       Swallow Study   General HPI: Sean Collins is a 65 year old gentleman who presents with dysarthria. 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 He had associated lightheadedness and gait instability where he would "fade" to the left when trying to walk straight. MRI-Acute RIGHT MCA territory lenticulostriate infarct. Patient with worsening speech and LUE symptoms and a repeat MRI showed more prominent right BG/CR infarct. CTA showed right ICA soft plaque with 50% stenosis. PMHx-CVA in September 2016 where MRI showed multiple acute tiny right posterior frontal lobe infarcts in the MCA distribution, multiple CVAs, Lt hip fx, HTN, HLD, diet controlled T2DM, and tobacco use. Type of Study: Bedside Swallow Evaluation Previous Swallow Assessment: none on record Diet Prior to this Study: NPO Temperature Spikes Noted: No Respiratory Status: Room air History of Recent Intubation: No Behavior/Cognition: Cooperative;Requires cueing Oral Cavity Assessment: Excessive secretions Oral Care Completed by SLP: Recent completion by staff Oral Cavity - Dentition: Poor condition Vision: Functional for self-feeding Self-Feeding Abilities: Able to feed self;Needs set up;Needs assist Patient Positioning: Upright in bed Baseline Vocal Quality: Wet;Low vocal intensity Volitional Cough: Weak Volitional Swallow: Able to elicit    Oral/Motor/Sensory Function Overall Oral Motor/Sensory Function: Severe impairment Facial ROM: Suspected CN VII (facial) dysfunction Facial Symmetry: Suspected CN VII (facial) dysfunction  Facial Strength: Suspected CN VII (facial) dysfunction Lingual ROM: Suspected CN XII (hypoglossal) dysfunction Lingual  Symmetry: Suspected CN XII (hypoglossal) dysfunction Lingual Strength: Suspected CN XII (hypoglossal) dysfunction Velum: Impaired left   Ice Chips Ice chips: Impaired Presentation: Spoon;Self Fed Oral Phase Impairments: Reduced lingual movement/coordination;Poor awareness of bolus Oral Phase Functional Implications: Oral holding Pharyngeal Phase Impairments: Multiple swallows   Thin Liquid Thin Liquid: Impaired Presentation: Cup;Self Fed Oral Phase Impairments: Reduced labial seal;Poor awareness of bolus Oral Phase Functional Implications: Left anterior spillage;Oral holding Pharyngeal  Phase Impairments: Suspected delayed Swallow;Multiple swallows;Cough - Delayed    Nectar Thick Nectar Thick Liquid: Not tested   Honey Thick Honey Thick Liquid: Not tested   Puree Puree: Impaired Presentation: Self Fed;Spoon Oral Phase Impairments: Reduced labial seal;Poor awareness of bolus;Reduced lingual movement/coordination Oral Phase Functional Implications: Left anterior spillage;Prolonged oral transit;Left lateral sulci pocketing;Oral residue Pharyngeal Phase Impairments: Suspected delayed Swallow;Multiple swallows;Cough - Delayed   Solid   GO   Solid: Not tested Other Comments: due to severity of deficits       [name]., CCC-SLP [id number]  [name] 10/25/2015,3:26 PM

## 2015-10-25 NOTE — Care Management Note (Signed)
Case Management Note  Patient Details  Name: Sean Collins MRN: [id number] Date of Birth: [birth year]  Subjective/Objective:    Pt admitted with CVA. He is from home with his spouse. Pt with worsening symptoms and repeat MRI to be done.  Pt has [place] insurance. CM informed Jaymes Graff CSW with the [place] of patients admission. She states he is 30% service connected.             Action/Plan: Initially PT stated no f/u. Will see what recommendation is with new symptoms. CM will continue to follow for discharge needs.   Expected Discharge Date:                  Expected Discharge Plan:     In-House Referral:     Discharge planning Services     Post Acute Care Choice:    Choice offered to:     DME Arranged:    DME Agency:     HH Arranged:    HH Agency:     Status of Service:  In process, will continue to follow  Medicare Important Message Given:    Date Medicare IM Given:    Medicare IM give by:    Date Additional Medicare IM Given:    Additional Medicare Important Message give by:     If discussed at [place] of Stay Meetings, dates discussed:    Additional Comments:  Pollie Friar, RN 10/25/2015, 11:57 AM

## 2015-10-25 NOTE — Progress Notes (Signed)
   Subjective: Patient feels that his speech is getting worse this morning and weaker in the LUE. He is discouraged that he will not return to his functional status prior to this stroke. He says his "time is up" and "they're calling my name." He says he has some difficulty swallowing and has not eaten all of his breakfast.  Objective: Vital signs in last 24 hours: Filed Vitals:   10/24/15 2100 10/25/15 0142 10/25/15 0545 10/25/15 0930  BP: 165/76 142/79 138/84 159/74  Pulse: 61 64 68 71  Temp: 97.7 F (36.5 C) 98 F (36.7 C) 97.9 F (36.6 C) 99 F (37.2 C)  TempSrc: Oral Oral Oral Oral  Resp: 18 18 18 16   Height:      Weight:      SpO2: 96% 98% 98% 95%   Weight change:   Intake/Output Summary (Last 24 hours) at 10/25/15 1053 Last data filed at 10/24/15 2020  Gross per 24 hour  Intake      0 ml  Output    450 ml  Net   -450 ml   General: sitting up in bed, discouraged mood HEENT: EOMI Cardiac: RRR, no rubs, murmurs or gallops Pulm: clear to auscultation bilaterally, moving normal volumes of air Abd: soft, nontender Ext: warm and well perfused, no pedal edema Neuro: alert and oriented X3, worsened vibratory dysarthria compared to yesterday with drooling, strength 4/5 LUE compared to 5/5 yesterday, 5/5 other extremities,   Assessment/Plan: Principal Problem:   CVA (cerebral infarction) Active Problems:   Hypertension associated with diabetes ([place])  Acute Right MCA territory lenticulostriate infarct: Seen on MRI brain 6/17. TTE showed an EF of 55-60%, PA peak pressure 55 mmHg, no comment on thrombus. Preliminary carotid duplex shows Right ICA 40-59% stenosis, Left ICA 1-39% stenosis. Preliminary LE dopplers without DVT or SVT. CTA neck shows 50% stenosis at origin of Right ICA as well as 15 mm distally. Also shows 40% stenosis at proximal Left ICA. He has known atherosclerosis which is the most likely cause for his recurrent strokes. He has worsened dysarthria and LUE weakness  this morning compared to yesterday. Chart review reveals a 27 beat run of Vtach this morning as well. Concerned that patient has extension of infarct with worsened symptoms. Will rescan with MRI, neurology notified. -Neurology consulted, appreciate assistance and recommendations -Repeat MRI brain now -Diet NPO -SLP eval -Reconsult PT -Hgb A1C is 6.1 -Lipid panel within normal limits -Permissive HTN <220/120 -Risk prevention (smoking cessation) -Aspirin increased to 325 mg daily per neurology -Continue Plavix 75 mg -recommend 30 day holter monitor outpatient   HTN: Takes Enalapril 20 mg qd, Atenolol 50 mg BID, Amlodipine 10 mg daily -Hold meds for permissive HTN  HLD: Takes Lipitor 20 mg  -Increased Lipitor to 40 mg  Proteinuria: >300 mg on UA. Already on an ACE-I. -f/u microalbumin/creatinine urine ratio -f/u with PCP for further evaluation  Tobacco use: Patient now smoking 1 pack every two days. Acknowledges that he needs to quit. Quit cold [place] in the past. -Continue smoking cessation counseling   Dispo:  Deferred at this time, awaiting improvement of current medical problems, Pending SLP/OT and further neuro recommendations.  The patient does have a current PCP with the [facility] (Provider Default, MD) and does not need an [facility] hospital follow-up appointment after discharge.  The patient does not have transportation limitations that hinder transportation to clinic appointments.    LOS: 2 days   Zada Finders, MD 10/25/2015, 10:53 AM

## 2015-10-25 NOTE — Evaluation (Signed)
Speech Language Pathology Evaluation Patient Details Name: Sean Collins MRN: [id number] DOB: 10/23/1950 Today's Date: 10/25/2015 Time: 1455-1510 SLP Time Calculation (min) (ACUTE ONLY): 15 min  Problem List:  Patient Active Problem List   Diagnosis Date Noted  . Dysarthria   . Tobacco use disorder   . HLD (hyperlipidemia)   . CVA (cerebral infarction) 01/22/2015  . Hypertension associated with diabetes ([place]) 11/16/2011  . Tobacco abuse 11/16/2011  . Hyperlipidemia 11/16/2011  . Diabetes mellitus ([place]) 11/16/2011  . Gout 11/16/2011  . History of stroke 11/16/2011  . CAD (coronary artery disease) 11/16/2011   Past Medical History:  Past Medical History  Diagnosis Date  . Hypertension   . Hyperlipidemia   . Stroke ([place])   . Diabetes mellitus without complication [facility])    Past Surgical History: History reviewed. No pertinent past surgical history. HPI:  Sean Collins is a 65 year old gentleman who presents with dysarthria. 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 He had associated lightheadedness and gait instability where he would "fade" to the left when trying to walk straight. MRI-Acute RIGHT MCA territory lenticulostriate infarct. Patient with worsening speech and LUE symptoms and a repeat MRI showed more prominent right BG/CR infarct. CTA showed right ICA soft plaque with 50% stenosis. PMHx-CVA in September 2016 where MRI showed multiple acute tiny right posterior frontal lobe infarcts in the MCA distribution, multiple CVAs, Lt hip fx, HTN, HLD, diet controlled T2DM, and tobacco use.   Assessment / Plan / Recommendation Clinical Impression  Patient pesents with severe oral motor deficits, primarily left-sided resulting in imprecise consonant productions and impaired speech intelligibility at the word level.  Patient also demonstrates lethargy versus impaired sustained attention to basic tasks;  however given that all other areas of cognition appear intact likely lethargy.  Patient would benefit from skilled acute SLP service during this admission and anticipate patient will require services at the next level of care as well.         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)   Cognition  Overall Cognitive Status: Impaired/Different from baseline Orientation Level: Oriented X4 Attention: Sustained Sustained Attention: Impaired (versus lethergy ) Sustained Attention Impairment: Functional basic Memory: Appears intact Awareness: Appears intact Problem Solving: Appears intact Safety/Judgment: Appears intact    Comprehension  Auditory Comprehension Overall Auditory Comprehension: Appears within functional limits for tasks assessed    Expression Expression Primary Mode of Expression: Verbal Verbal Expression Overall Verbal Expression: Appears within functional limits for tasks assessed   Oral / Motor  Oral Motor/Sensory Function Overall Oral Motor/Sensory Function: Severe impairment Facial ROM: Suspected CN VII (facial) dysfunction Facial Symmetry: Suspected CN VII (facial) dysfunction Facial Strength: Suspected CN VII (facial) dysfunction Lingual ROM: Suspected CN XII (hypoglossal) dysfunction Lingual Symmetry: Suspected CN XII (hypoglossal) dysfunction Lingual Strength: Suspected CN XII (hypoglossal) dysfunction Velum: Impaired left Motor Speech Overall Motor Speech: Impaired Respiration: Impaired Level of  Impairment: Word Phonation: Wet;Low vocal intensity Articulation: Impaired Level of Impairment: Word Intelligibility: Intelligibility reduced Word: 25-49% accurate Motor  Planning: Impaired (intermittent dysfluencies at the initial phoneme level ) Level of Impairment: Word Motor Speech Errors: Inconsistent   GO                   [name]., CCC-SLP [id number]  [place] 10/25/2015, 3:40 PM

## 2015-10-26 ENCOUNTER — Other Ambulatory Visit: Payer: Self-pay

## 2015-10-26 ENCOUNTER — Inpatient Hospital Stay (HOSPITAL_COMMUNITY): Payer: Medicare Other

## 2015-10-26 DIAGNOSIS — J690 Pneumonitis due to inhalation of food and vomit: Secondary | ICD-10-CM | POA: Diagnosis present

## 2015-10-26 DIAGNOSIS — I6523 Occlusion and stenosis of bilateral carotid arteries: Secondary | ICD-10-CM

## 2015-10-26 DIAGNOSIS — I69391 Dysphagia following cerebral infarction: Secondary | ICD-10-CM | POA: Diagnosis not present

## 2015-10-26 DIAGNOSIS — Z01810 Encounter for preprocedural cardiovascular examination: Secondary | ICD-10-CM

## 2015-10-26 DIAGNOSIS — I639 Cerebral infarction, unspecified: Secondary | ICD-10-CM

## 2015-10-26 LAB — CBC
HEMATOCRIT: 41.7 % (ref 39.0–52.0)
HEMOGLOBIN: 13.6 g/dL (ref 13.0–17.0)
MCH: 26.9 pg (ref 26.0–34.0)
MCHC: 32.6 g/dL (ref 30.0–36.0)
MCV: 82.4 fL (ref 78.0–100.0)
Platelets: 241 10*3/uL (ref 150–400)
RBC: 5.06 MIL/uL (ref 4.22–5.81)
RDW: 15.3 % (ref 11.5–15.5)
WBC: 12.4 10*3/uL — ABNORMAL HIGH (ref 4.0–10.5)

## 2015-10-26 LAB — GLUCOSE, CAPILLARY
GLUCOSE-CAPILLARY: 95 mg/dL (ref 65–99)
GLUCOSE-CAPILLARY: 111 mg/dL — AB (ref 65–99)
Glucose-Capillary: 125 mg/dL — ABNORMAL HIGH (ref 65–99)
Glucose-Capillary: 76 mg/dL (ref 65–99)
Glucose-Capillary: 85 mg/dL (ref 65–99)
Glucose-Capillary: 91 mg/dL (ref 65–99)

## 2015-10-26 LAB — BASIC METABOLIC PANEL
ANION GAP: 11 (ref 5–15)
BUN: 16 mg/dL (ref 6–20)
CO2: 24 mmol/L (ref 22–32)
Calcium: 9.8 mg/dL (ref 8.9–10.3)
Chloride: 106 mmol/L (ref 101–111)
Creatinine, Ser: 1.26 mg/dL — ABNORMAL HIGH (ref 0.61–1.24)
GFR calc Af Amer: >60 mL/min (ref >60)
GFR calc non Af Amer: 58 mL/min — ABNORMAL LOW (ref >60)
GLUCOSE: 100 mg/dL — AB (ref 65–99)
POTASSIUM: 4 mmol/L (ref 3.5–5.1)
Sodium: 141 mmol/L (ref 135–145)

## 2015-10-26 MED ORDER — JEVITY 1.2 CAL PO LIQD: 1000.0000 mL | ORAL | Status: DC

## 2015-10-26 MED ORDER — SODIUM CHLORIDE 0.9 % IV SOLN
INTRAVENOUS | Status: AC
  Administered 2015-10-26 – 2015-10-27 (×2): via INTRAVENOUS

## 2015-10-26 MED ORDER — ALLOPURINOL 100 MG PO TABS
300.0000 mg | ORAL_TABLET | Freq: Every day | ORAL | Status: DC
Start: 2015-10-27 — End: 2015-11-02
  Administered 2015-10-27 – 2015-11-01 (×5): 300 mg via NASOGASTRIC
  Filled 2015-10-26 (×5): qty 3

## 2015-10-26 MED ORDER — JEVITY 1.5 CAL/FIBER PO LIQD
1000.0000 mL | ORAL | Status: DC
  Administered 2015-10-26 – 2015-10-30 (×4): 1000 mL
  Filled 2015-10-26 (×11): qty 1000

## 2015-10-26 MED ORDER — AMPICILLIN-SULBACTAM SODIUM 3 (2-1) G IJ SOLR
3.0000 g | Freq: Three times a day (TID) | INTRAMUSCULAR | Status: DC
  Administered 2015-10-26 – 2015-10-31 (×16): 3 g via INTRAVENOUS
  Filled 2015-10-26 (×18): qty 3

## 2015-10-26 MED ORDER — ACETAMINOPHEN 160 MG/5ML PO SUSP
650.0000 mg | Freq: Four times a day (QID) | ORAL | Status: DC | PRN
  Filled 2015-10-26: qty 20.3

## 2015-10-26 MED ORDER — SCOPOLAMINE 1 MG/3DAYS TD PT72
1.0000 | MEDICATED_PATCH | Freq: Once | TRANSDERMAL | Status: AC
  Administered 2015-10-26: 1.5 mg via TRANSDERMAL
  Filled 2015-10-26: qty 1

## 2015-10-26 MED ORDER — ATORVASTATIN CALCIUM 40 MG PO TABS
40.0000 mg | ORAL_TABLET | Freq: Every day | ORAL | Status: DC
Start: 2015-10-26 — End: 2015-11-02
  Administered 2015-10-27 – 2015-11-01 (×5): 40 mg via NASOGASTRIC
  Filled 2015-10-26 (×8): qty 1

## 2015-10-26 MED ORDER — CLOPIDOGREL BISULFATE 75 MG PO TABS
75.0000 mg | ORAL_TABLET | Freq: Every day | ORAL | Status: DC
  Administered 2015-10-27: 75 mg via NASOGASTRIC
  Filled 2015-10-26: qty 1

## 2015-10-26 NOTE — Progress Notes (Signed)
Pharmacy Antibiotic Note  Sean Collins is a 65 y.o. male admitted on 10/23/2015 with aspiration pna.  Pharmacy has been consulted for unasyn dosing.  Plan: Unasyn 3 gm q8h  Height: 5\' 9"  (175.3 cm) Weight: 168 lb 10.4 oz (76.5 kg) IBW/kg (Calculated) : 70.7  Temp (24hrs), Avg:98.7 F (37.1 C), Min:98 F (36.7 C), Max:99.2 F (37.3 C)   Recent Labs Lab 10/23/15 1442 10/23/15 1449 10/24/15 0502 10/26/15 0638  WBC 7.3  --   --  12.4*  CREATININE 1.29* 1.30* 1.12 1.26*    Estimated Creatinine Clearance: 58.4 mL/min (by C-G formula based on Cr of 1.26).    No Known Allergies  Levester Fresh, PharmD, BCPS, [facility] Clinical Pharmacist Pager [phone] 10/26/2015 11:18 AM

## 2015-10-26 NOTE — Progress Notes (Signed)
Pt requested wallet to be stored in security while pt admitted in hospital. RN was told by security [phone]) to take wallet to POD C in Emergency Department where security officers would inventory items with RN and store before going back to floor. RN was accompanied by Adela Lank (Nurse Extern) on the floor to take wallet to security office. Upon arrival security informed RN that forms need to be filled in front of patient and inventoried before taking it back to security. RN returned to pt's room with Nurse extern and inventoried items. Pt signed documents showing items in wallet before taking wallet to security.

## 2015-10-26 NOTE — Progress Notes (Signed)
Speech Language Pathology Treatment: Dysphagia  Patient Details Name: Sean Collins MRN: [id number] DOB: 07-18-1950 Today's Date: 10/26/2015 Time: [id number] SLP Time Calculation (min) (ACUTE ONLY): 15 min  Assessment / Plan / Recommendation Clinical Impression  Pt was seen in radiology for completion of MBS to determine least restrictive diet. SLP noted pt having significant difficulty managing secretions, and cancelled MBS due to inapropriateness. Discussed with MD, and recommended NPO status including meds. Once back in his room, oral care was completed, and pt was offered suction to assist with secretion management. This was successful for less than 1 minute, at which time his voice quality was noted to become wet again. Pt was encouraged to cough secretions up and suction them out. ST will continue to follow patient for treatment of significant dysarthria and to assess improvement for rescheduling MBS.    HPI HPI: [name] is a 65 year old gentleman who presents with severe dysarthria. 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 He had associated lightheadedness and gait instability where he would "fade" to the left when trying to walk straight. MRI-Acute RIGHT MCA territory lenticulostriate infarct. Patient with worsening speech and LUE symptoms and a repeat MRI showed more prominent right BG/CR infarct. CTA showed right ICA soft plaque with 50% stenosis. PMHx-CVA in September 2016 where MRI showed multiple acute tiny right posterior frontal lobe infarcts in the MCA distribution, multiple CVAs, Lt hip fx, HTN, HLD, diet controlled T2DM, and tobacco use.      SLP Plan  Continue with current plan of care     Recommendations  Diet recommendations: NPO Medication Administration: Via alternative means      Oral Care Recommendations: Staff/trained caregiver to provide oral care (have suction set up) Plan:  Continue with current plan of care     Shonna Chock 10/26/2015, 10:37 AM  Sean Collins [facility], [place] [phone]

## 2015-10-26 NOTE — Progress Notes (Signed)
STROKE TEAM PROGRESS NOTE   SUBJECTIVE (INTERVAL HISTORY) Pt had worsening dysarthria, as well as elevated Cre, leukocytosis and possible pneumonia. Not on IVF overnight. VVS consult recommended CEA but currently hold off due to clinical worsening.    OBJECTIVE Temp:  [98 F (36.7 C)-99.9 F (37.7 C)] 99.9 F (37.7 C) (06/20 1735) Pulse Rate:  [79-87] 86 (06/20 1735) Cardiac Rhythm:  [-] Normal sinus rhythm (06/19 1900) Resp:  [18-24] 24 (06/20 1735) BP: (133-169)/(68-82) 158/74 mmHg (06/20 1735) SpO2:  [94 %-99 %] 98 % (06/20 1735)  CBC:   Recent Labs Lab 10/23/15 1442 10/23/15 1449 10/26/15 0638  WBC 7.3  --  12.4*  NEUTROABS 4.0  --   --   HGB 12.2* 13.9 13.6  HCT 37.5* 41.0 41.7  MCV 82.6  --  82.4  PLT 217  --  [id number]    Basic Metabolic Panel:   Recent Labs Lab 10/24/15 0502 10/26/15 0638  NA 140 141  K 4.1 4.0  CL 107 106  CO2 24 24  GLUCOSE 89 100*  BUN 11 16  CREATININE 1.12 1.26*  CALCIUM 9.4 9.8    Lipid Panel:     Component Value Date/Time   CHOL 138 10/24/2015 0502   TRIG 59 10/24/2015 0502   HDL 43 10/24/2015 0502   CHOLHDL 3.2 10/24/2015 0502   VLDL 12 10/24/2015 0502   LDLCALC 83 10/24/2015 0502   HgbA1c:  Lab Results  Component Value Date   HGBA1C 6.1* 10/23/2015   Urine Drug Screen:     Component Value Date/Time   LABOPIA NONE DETECTED 10/23/2015 1530   COCAINSCRNUR NONE DETECTED 10/23/2015 1530   LABBENZ NONE DETECTED 10/23/2015 1530   AMPHETMU NONE DETECTED 10/23/2015 1530   THCU NONE DETECTED 10/23/2015 1530   LABBARB NONE DETECTED 10/23/2015 1530      IMAGING I have personally reviewed the radiological images below and agree with the radiology interpretations.  Ct Head Wo Contrast 10/23/2015   No acute intracranial process. Chronic microvascular ischemic changes and cortical atrophy.   Mri & Mra Head/brain Wo Cm 10/23/2015   Acute RIGHT MCA territory lenticulostriate infarct, nonhemorrhagic. Atrophy and small vessel  disease. Scattered foci of chronic hemorrhagic and nonhemorrhagic lacunar infarcts. Incompletely evaluated LEFT ethmoid air cell opacity, possible developing mucocele.  No proximal large vessel occlusion or flow-limiting stenosis. Intracranial atherosclerotic change most notable in the LEFT A1 ACA and LEFT P2 PCA, as well as BILATERAL MCA M2 and M3 vessels.  CTA neck 50% diameter stenosis at the origin of the right internal carotid artery. 15 mm distally there is another 50% diameter stenosis due to noncalcified atherosclerotic plaque. 40% diameter stenosis proximal left internal carotid artery. Mild stenosis at the proximal vertebral artery bilaterally. Both vertebral arteries patent.  CUS - Right: 40-59% ICA stenosis (mid to distal),highest end of scale, however, higher velocities may be obscured as patient has high bifurcation and significant plaquing. Left: 1-39% ICA stenosis. Unable to adequately visualize the distal ICA secondary to high bifurcation. Bilateral vertebral artery flow is antegrade.   LE venous doppler - There is no DVT or SVT noted in the bilateral lower extremities.   TTE  - Left ventricle: The cavity size was normal. Wall thickness was normal. Systolic function was normal. The estimated ejection fraction was in the range of 55% to 60%. Wall motion was normal; there were no regional wall motion abnormalities. Left ventricular diastolic function parameters were normal. - Mitral valve: Calcified annulus. Mildly thickened leaflets . -  Left atrium: The atrium was mildly dilated. - Right atrium: The atrium was mildly dilated. - Atrial septum: No defect or patent foramen ovale was identified. - Tricuspid valve: There was moderate regurgitation. - Pulmonary arteries: PA peak pressure: 55 mm Hg (S). - Pericardium, extracardiac: A trivial pericardial effusion was identified.  Ct Angio Neck W Or Wo Contrast 10/24/2015  IMPRESSION: 50% diameter stenosis at the origin of the right  internal carotid artery. 15 mm distally there is another 50% diameter stenosis due to noncalcified atherosclerotic plaque. 40% diameter stenosis proximal left internal carotid artery. Mild stenosis at the proximal vertebral artery bilaterally. Both vertebral arteries patent.    Mr Brain Wo Contrast 10/25/2015  IMPRESSION: Interval enlargement of right posterior corona radiata/ posterior right lenticular nucleus nonhemorrhagic infarct. Remote left frontal lobe infarct with encephalomalacia. Remote corona radiata and basal ganglia infarcts bilaterally. Moderate global atrophy without hydrocephalus. Complex persistent opacification mid to posterior left ethmoid sinus air cells. Mild mucosal thickening remainder of ethmoid sinus air cells. Minimal to mild mucosal thickening frontal sinuses. Minimal maxillary sinus mucosal thickening.    PHYSICAL EXAM General - Well nourished, well developed, in no apparent distress.  Ophthalmologic - Fundi not visualized due to noncooperation.  Cardiovascular - Regular rate and rhythm.  Mental Status -  Level of arousal and orientation to time, place, and person were intact. Language including expression, naming, repetition, comprehension was assessed and found intact, moderate dysarthria. Fund of Knowledge was assessed and was intact.  Cranial Nerves II - XII - II - Visual field intact OU. III, IV, VI - Extraocular movements intact. V - Facial sensation intact bilaterally. VII - left nasolabial fold flattening. VIII - Hearing & vestibular intact bilaterally. X - Palate elevates symmetrically, moderate dysarthria. XI - Chin turning & shoulder shrug intact bilaterally. XII - Tongue protrusion intact.  Motor Strength - The patient's strength was left UE 3-/5 and LLE 3/5, RUE and RLE 5/5  and pronator drift was absent.  Bulk was normal and fasciculations were absent.   Motor Tone - Muscle tone was assessed at the neck and appendages and was normal.  Reflexes -  The patient's reflexes were 1+ in all extremities and he had no pathological reflexes.  Sensory - Light touch, temperature/pinprick were assessed and were symmetrical.    Coordination - The patient had normal movements in the hands with no ataxia or dysmetria.  Tremor was absent.  Gait and Station -  Not tested due to safety concerns.   ASSESSMENT/PLAN [name] is a 65 y.o. male with history of HTN, HLD, pre-DM, previous stroke, MI s/p stent, current smoker presenting with slurry speech and gait imbalance. He did not receive IV t-PA due to out of time window.   Stroke:  right BG/CR punctate infarcts, likely due to intracranial atherosclerosis and right ICA stenosis. Repeat MRI showed more prominent right BG/CR infarct without hypotension.   Resultant  Worsening dysarthria and left hemiparesis  MRI  Right BG/CR punctate infarcts  Repeat MRI showed more prominent right BG/CR infarction  MRA  Diffuse intracranial athero, right M2, BA, b/l PCA  Carotid Doppler  40-59% right ICA  CTA neck R ICA 50% with soft plaque, L ICA 40%, mild B VA stenosis  2D Echo  EF 55-60%  LE venous doppler negative for DVT  LDL 83  HgbA1c 6.1  lovenox subq for VTE prophylaxis Diet NPO time specified  aspirin 81 mg daily and clopidogrel 75 mg daily prior to admission, now on  aspirin 325 mg daily and clopidogrel 75 mg daily. Continue DAPT on discharge for both cardiac and stroke prevention.  Patient counseled to be compliant with his antithrombotic medications  Ongoing aggressive stroke risk factor management  Therapy recommendations:  No PT  Disposition:  Pending  Right ICA stenosis  Carotid Doppler  40-59% right ICA  CTA neck R ICA 50% with soft plaque, L ICA 40%, mild B VA stenosis  Vascular surgery on board and recommended CEA.  Pt needs outpt follow up with VVS for right CEA  History of stroke  11/2011 - left CR - MRA diffuse athero - CUS and TTE negative - on  ASA  01/2015 - right semi ovale punctate infarcts - MRA diffuse athero - CUS and TTE negative - LDL 180 and A1C 6.3 - ASA + plavix + zocor  [facility]  Dysphagia / aspiration pneumonia  Failed swallow  On tube feeding  CXR concerning for b/l aspiration penumonia  Put on unasyn  Speech on board  Hypertension  Stable on the high side, no hypotension episode  Goal 130-160 due to right ICA stenosis  Hyperlipidemia  Home meds:  lipitor 60mg   LDL 83, goal < 70  Resume lipitor 40mg  daily  Continue statin at discharge  Tobacco abuse  Current smoker  Smoking cessation counseling provided  Pt is willing to quit  Other Stroke Risk Factors  Advanced age   Hx stroke/TIA  Coronary artery disease s/p stent  Other Active Problems  Proteinuria  Hospital day # 3  The patient has worsening neuro symptoms and found to have elevated WBC, elevated Cre, CXR concerning for pneumonia. Put on IVF, unasyn and tube feeing. Needs close monitoring. The patient is still at risk for recurrent strokes and TIAs and neurological worsening and he needs ongoing stroke evaluation and aggressive risk factor modification. Keep BP at 130-160 due to right ICA stenosis and worsening neuro symptoms.    Rosalin Hawking, MD PhD Stroke Neurology 10/26/2015 6:46 PM    To contact Stroke Continuity provider, please refer to [email or web address] After hours, contact General Neurology

## 2015-10-26 NOTE — Care Management Important Message (Signed)
Important Message  Patient Details  Name: Sean Collins MRN: [id number] Date of Birth: 04-13-1951   Medicare Important Message Given:  Yes    Delberta Folts, Leroy Sea 10/26/2015, 8:21 AM

## 2015-10-26 NOTE — Progress Notes (Signed)
Subjective: Patient feels that his speech is "a little better" however he is less verbal, now unable to protrude tongue and with left-sided facial droop. Left upper and lower extremity weakness. SLP saw patient yesterday, he is still NPO, going for MBS today.   Objective: Vital signs in last 24 hours: Filed Vitals:   10/25/15 2112 10/26/15 0121 10/26/15 0537 10/26/15 1057  BP: 133/82 169/77 153/68 161/74  Pulse: 79 87 82 79  Temp:  99 F (37.2 C) 98.7 F (37.1 C) 99.2 F (37.3 C)  TempSrc: Oral Oral Oral Oral  Resp: 20 20 20 18   Height:      Weight:      SpO2: 99% 94% 96% 98%   Weight change:  No intake or output data in the 24 hours ending 10/26/15 1114 General: resting in bed HEENT: slow vertical extraocular movments Cardiac: RRR, no rubs, murmurs or gallops Pulm: slight rhonchi left lower lung field, clear , moving normal volumes of air Abd: soft, nontender Ext: warm and well perfused, no pedal edema Neuro: dysarthric and less vocal, strength 2/5 LUE uses right arm to lift, LLE 3/5, 5/5 right upper and lower extremities, absent tongue protrusion, left facial droop  Assessment/Plan: Principal Problem:   CVA (cerebral infarction) Active Problems:   Hypertension associated with diabetes (HCC)   Dysarthria   Tobacco use disorder   HLD (hyperlipidemia)  Acute Right MCA territory lenticulostriate infarct: Seen on MRI brain 6/17. TTE showed an EF of 55-60%, PA peak pressure 55 mmHg, no comment on thrombus. Preliminary carotid duplex shows Right ICA 40-59% stenosis, Left ICA 1-39% stenosis. Preliminary LE dopplers without DVT or SVT. CTA neck shows 50% stenosis at origin of Right ICA as well as 15 mm distally. Also shows 40% stenosis at proximal Left ICA. He has known atherosclerosis which is the most likely cause for his recurrent strokes. Repeat MRI yesterday showed evolution of stroke, no new infarct. He has continued functional decline this morning. VVS saw patient yesterday  and plan for CEA prior to discharge if neurological function stabilizes. Concerned that he may have underlying infection from aspiration causing progressive worsening of his neuro deficits. He has been afebrile, but white count is increased 12.4k. CXR suspicious for aspiration pneumonia -Neurology and VVS consulted, appreciate assistance and recommendations -Diet strict NPO, unable to complete MBS due to inability to control secretions; appreciate SLP assistance -Place NG tube, meds per tube -Consult nutrition  -Repeat UA -IV NS @ 125 mL/hr -Reconsult PT/OT -Permissive HTN <220/120 -Aspirin increased to 325 mg daily per neurology -Continue Plavix 75 mg -recommend 30 day holter monitor outpatient  Aspiration Pneumonia: CXR 6/20 concerning for aspiration pneumonia. Given patient's severe dysphagia, elevated white count, and worsening neurological deficits, will initiate antibiotics. -IV Unasyn per pharmacy -Monitor CBC, BMP   HTN: Takes Enalapril 20 mg qd, Atenolol 50 mg BID, Amlodipine 10 mg daily -Hold meds for permissive HTN  HLD: Takes Lipitor 20 mg  -Increased Lipitor to 40 mg  Proteinuria: >300 mg on UA. Already on an ACE-I. -f/u microalbumin/creatinine urine ratio -f/u with PCP for further evaluation  Tobacco use: Patient now smoking 1 pack every two days. Acknowledges that he needs to quit. Quit cold [place] in the past. -Continue smoking cessation counseling   Dispo:  Deferred at this time, awaiting improvement of current medical problems, needs to con  The patient does have a current PCP with the [place] (Provider Default, MD) and does not need an [facility] hospital follow-up appointment after  discharge.  The patient does not have transportation limitations that hinder transportation to clinic appointments.    LOS: 3 days   Zada Finders, MD 10/26/2015, 11:14 AM

## 2015-10-26 NOTE — Evaluation (Addendum)
Occupational Therapy Evaluation Patient Details Name: Sean Collins MRN: [id number] DOB: 06/12/1950 Today's Date: 10/26/2015    History of Present Illness Pt is a 65 y.o. M who presented with dysarthria. MRI revealed Acute RIGHT MCA territory lenticulostriate infarct.  Pt w/ increased weakness Lt UE/LE noted on 6/19 and MRI on 6/19 noted enlargement of Rt posterior corona radiata/posterior Rt lenticular nucleus nonhemorrhagic infarct.  Pt's PMH includes CVA in September 2016 where MRI showed multiple acute tiny right posterior frontal lobe infarcts in the MCA distribution, multiple CVAs, Lt hip fx, HTN, HLD, diet controlled T2DM, and tobacco use.   Clinical Impression   Pt admitted with above and appeared worse functionally today for OT than pt did with PT on 6/18. Unsure of pt's true PLOF with ADLs. Feel pt will benefit from acute OT to increase independence prior to d/c. Recommending CIR and feel pt is a great candidate.     Follow Up Recommendations  CIR    Equipment Recommendations  Other (comment) (defer to next venue)    Recommendations for Other Services       Precautions / Restrictions Precautions Precautions: Fall Restrictions Weight Bearing Restrictions: No      Mobility Bed Mobility Overal bed mobility: Needs Assistance Bed Mobility: Supine to Sit;Sit to Supine Supine to sit: Max assist Sit to supine: Mod assist   General bed mobility comments: assist with trunk and LEs when coming to sitting position.   Transfers Overall transfer level: Needs assistance Equipment used: Rolling walker (2 wheeled) Transfers: Sit to/from Stand Sit to Stand: Max assist       General transfer comment: assist given for balance upon standing.     Balance Overall balance assessment: Needs assistance      Assist for balance sitting EOB.                ADL Overall ADL's : Needs assistance/impaired                     Lower Body Dressing: Maximal  assistance;Sit to/from stand   Toilet Transfer: RW;Maximal assistance (sit to stand from bed)           Functional mobility during ADLs: Rolling walker;Maximal assistance (sit to stand from bed)       Vision  Pt wears glasses.   Perception     Praxis      Pertinent Vitals/Pain Pain Assessment: No/denies pain     Hand Dominance Right   Extremity/Trunk Assessment Upper Extremity Assessment Upper Extremity Assessment: LUE deficits/detail LUE Deficits / Details: weak grip strength; able to move digits; no active movement noted of left shoulder  LUE Coordination: decreased fine motor;decreased gross motor   Lower Extremity Assessment Lower Extremity Assessment: Defer to PT evaluation LLE Deficits / Details: With MMT knee flexion and extension 4-/5, adduction/abduction 4/5; However, functionally pt unable to pivot Lt foot during transfer. LLE Sensation:  (WNL) LLE Coordination: decreased gross motor;decreased fine motor     Communication Communication Communication: Expressive difficulties   Cognition Arousal/Alertness: Awake/alert Behavior During Therapy: Flat affect Overall Cognitive Status: Difficult to assess due to impaired communication                      General Comments          Shoulder Instructions      Home Living Family/patient expects to be discharged to:: Unsure (agreeable to CIR)-agreeable to CIR Living Arrangements: Spouse/significant other Available Help at Discharge: Family;Friend(s);Available PRN/intermittently  Type of Home: House Home Access: Level entry     Home Layout: One level     Bathroom Shower/Tub: Teacher, early years/pre: Standard     Home Equipment: None          Prior Functioning/Environment Level of Independence: Independent (per PT eval)        Comments: work as a Production assistant, radio care    OT Diagnosis: Other (comment)-(hemiparesis non-dominant side)   OT Problem List: Decreased  strength;Decreased range of motion;Decreased coordination;Impaired balance (sitting and/or standing);Decreased knowledge of use of DME or AE;Decreased knowledge of precautions;Impaired UE functional use   OT Treatment/Interventions: Self-care/ADL training;Therapeutic exercise;Neuromuscular education;Patient/family education;Balance training;DME and/or AE instruction;Therapeutic activities    OT Goals(Current goals can be found in the care plan section) Acute Rehab OT Goals Patient Stated Goal: not stated OT Goal Formulation: With patient Time For Goal Achievement: 11/02/15 Potential to Achieve Goals: Good ADL Goals Pt Will Perform Grooming: with min assist;sitting Pt Will Perform Upper Body Bathing: with min assist;sitting Pt Will Perform Upper Body Dressing: with min assist;sitting Pt Will Perform Lower Body Dressing: with mod assist;sit to/from stand Pt Will Transfer to Toilet: with mod assist;ambulating;bedside commode Pt Will Perform Toileting - Clothing Manipulation and hygiene: with mod assist;sit to/from stand Additional ADL Goal #1: Pt will independently perform HEP for LUE to increase strength and coordination.  OT Frequency: Min 2X/week   Barriers to D/C:            Co-evaluation              End of Session Equipment Utilized During Treatment: Gait belt;Rolling walker  Activity Tolerance: Patient tolerated treatment well Patient left: in bed;with call bell/phone within reach;with bed alarm set   Time: [id number] OT Time Calculation (min): 16 min Charges:  OT General Charges $OT Visit: 1 Procedure OT Evaluation $OT Eval Moderate Complexity: 1 Procedure G-CodesBenito Mccreedy OTR/L [id number] 10/26/2015, 4:53 PM

## 2015-10-26 NOTE — Progress Notes (Addendum)
Initial Nutrition Assessment  DOCUMENTATION CODES:   Not applicable  INTERVENTION:  -Begin Jevity 1.5 @ 62mL/hr via NGT, Follow PEPUP protocol -221mL free water Q6H -At goal, Tubefeeding regimen provides 1980 calories, 84 grams of protein, and 2003 mL free water  NUTRITION DIAGNOSIS:   Inadequate oral intake related to dysphagia, inability to eat as evidenced by NPO status.  GOAL:   Patient will meet greater than or equal to 90% of their needs  MONITOR:   I & O's, Labs, Weight trends, Diet advancement, TF tolerance  REASON FOR ASSESSMENT:   Consult Enteral/tube feeding initiation and management  ASSESSMENT:   Sean Collins is a 65 year old gentleman with PMH of multiple CVAs, HTN, HLD, diet controlled T2DM, and tobacco use who presents with dysarthria. 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 This was around 2 pm, per ED notes, patient unable to give estimate of time this began. He says he was able to speak his intended words, but his speech was dysarthric. He says this was similar to his previous strokes. He had associated lightheadedness and gait instability where he would "fade" to the left when trying to walk straight. He did not fall or lose consciousness. He did not have any chest pain, SOB, palpitations, change in vision, headache, focal weakness, numbness or tingling.  Attempted to speak with Sean Collins at bedside but he was unable to communicate due to dysarthia. He attempted to write down information as well but it was difficult to understand. Per MD note, pt suffers from severe dysphagia, consulted for tubefeeding. Looking at him, he looks fine, some mild muscle wasting noted at temples but otherwise he is ok. No family at bedside to provide diet hx.  He does exhibit a 17#/9% insignificant wt loss in 9 months.  Pt has a hx of CVAs - last one was in September 2016, likely decreased PO intake and  culprit of weight loss.  Labs and Medications reviewed: NS @ 135mL/hr  Diet Order:  Diet NPO time specified  Skin:  Reviewed, no issues  Last BM:  6/19  Height:   Ht Readings from Last 1 Encounters:  10/23/15 5\' 9"  (1.753 m)    Weight:   Wt Readings from Last 1 Encounters:  10/23/15 168 lb 10.4 oz (76.5 kg)    Ideal Body Weight:  72.72 kg  BMI:  Body mass index is 24.89 kg/(m^2).  Estimated Nutritional Needs:   Kcal:  1900-2300 calories  Protein:  75-90 grams  Fluid:  >/= 1.9L  EDUCATION NEEDS:   No education needs identified at this time  Sean Moczygemba. Isaacs, MS, RD LDN Inpatient Clinical Dietitian Pager [phone]

## 2015-10-26 NOTE — Consult Note (Signed)
Pt chart reviewed worse overall.  Unable to swallow.  ? Aspiration pneumonia.  Will hold on CEA for now.  Follow up with me in 4-6 weeks to see if he has had reasonable recovery to consider CEA  Ruta Hinds, MD [facility]: [phone] Pager: [phone]

## 2015-10-26 NOTE — Progress Notes (Signed)
Nurse tech came to notify me that patient had removed his condom catheter again; upon entering room pt had ng tube in his hand which he had removed hisself.  Pt is very productive secretions and suctioned several times, ordered scolpolamine patch and notified MD.

## 2015-10-26 NOTE — Consult Note (Signed)
SLP Cancellation Note  Patient Details Name: Sean Collins MRN: [id number] DOB: May 06, 1951   Cancelled treatment:        Unable to complete MBS at this time, as pt is currently unable to manage secretions. ST will follow for clincial improvement at bedside and proceed accordingly. MD paged regarding cancellation.  Shonna Chock 10/26/2015, 10:13 AM   Enriqueta Shutter. Quentin Ore [facility], [place] [phone]

## 2015-10-26 NOTE — Progress Notes (Signed)
Rehab Admissions Coordinator Note:  Patient was screened by Retta Diones for appropriateness for an Inpatient Acute Rehab Consult.  At this time, we are recommending Inpatient Rehab consult.  Retta Diones 10/26/2015, 4:39 PM  I can be reached at [phone].

## 2015-10-26 NOTE — Progress Notes (Signed)
Patient seen and examined. Case d/w residents in detail. I agree with findings and plan as documented in Dr. Serita Grit note.  Patient with worsening dysarthria and dysphagia today and also noted to have worsening LUE and LLE weakness on exam. Patient now unable to swallow. Will place NGT for med placement and consider starting tube feeds for nutritional support while he recovers from his CVA.   Await neuro f/u. He likely continues to have evolution of his CVA (stuttering stroke). Neuro to follow up today. PT/OT to f/u today. Will likely benefit from CIR/SAR once stable. Vascular surgery follow up appreciated- will need CEA prior to d/c once neurological function stabilizes.c/w asa, plavix and allow permissive HTN.  CXR now with b/l lower lobe infiltrates worse on left consistent with aspiration PNA. Given increasing WBC and cough will treat empirically with unasyn.

## 2015-10-26 NOTE — Evaluation (Addendum)
Physical Therapy Evaluation Patient Details Name: Sean Collins MRN: [id number] DOB: 07/12/1950 Today's Date: 10/26/2015   History of Present Illness  Pt is a 65 y/o M who presented with dysarthria and instability where he would "fade" to the Lt when trying to walk straight.  MRI revealed MRI-Acute RIGHT MCA territory lenticulostriate infarct.  Pt w/ increased weakness Lt UE/LE noted on 6/19 and MRI on 6/19 noted enlargement of Rt posterior corona radiata/posterior Rt lenticular nucleus nonhemorrhagic infarct.  Pt's PMH includes CVA in September 2016 where MRI showed multiple acute tiny right posterior frontal lobe infarcts in the MCA distribution, multiple CVAs, Lt hip fx, HTN, HLD, diet controlled T2DM, and tobacco use.    Clinical Impression  Pt presents w/ Lt sided weakness and impaired coordination and balance following enlargement of acute infarct.  He currently requires mod +2 assist for sit<>stand and stand pivot transfers. 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  He is an excellent candidate for CIR.  Pt will benefit from continued skilled PT services to increase functional independence and safety.    Follow Up Recommendations CIR    Equipment Recommendations  Other (comment) (TBD at next venue of care)    Recommendations for Other Services OT consult;Speech consult     Precautions / Restrictions Precautions Precautions: Fall Restrictions Weight Bearing Restrictions: No      Mobility  Bed Mobility Overal bed mobility: Needs Assistance;+2 for physical assistance Bed Mobility: Rolling;Sidelying to Sit Rolling: Min assist Sidelying to sit: Min assist;+2 for physical assistance;HOB elevated       General bed mobility comments: Assist managing Lt UE and LE to roll and to advance Lt LE off EOB.  Assist to elevate trunk.  Transfers Overall transfer level: Needs assistance Equipment used: 2 person hand held assist Transfers: Sit to/from [employer] Sit to Stand: Mod assist;+2 physical assistance Stand pivot transfers: Mod assist;+2 physical assistance       General transfer comment: Assist to boost to standing w/ support provided to Lt UE during transfer.  Pt unable to advance Lt LE to pivot to chair and requires assist to steady, to guide hips to chair and for controlled descent to sit.  Ambulation/Gait             General Gait Details: Deferred as pt fatigues w/ pivot to chair.  Stairs            Wheelchair Mobility    Modified Rankin (Stroke Patients Only) Modified Rankin (Stroke Patients Only) Pre-Morbid Rankin Score: No significant disability Modified Rankin: Severe disability     Balance Overall balance assessment: Needs assistance Sitting-balance support: Single extremity supported;Feet supported Sitting balance-Leahy Scale: Poor Sitting balance - Comments: Close min guard>mod assist to maintain upright w/ cues provided to lean anteriorly.  Pt w/ posterior lean to the Lt sitting EOB ~5 minutes. Postural control: Posterior lean;Left lateral lean Standing balance support: Bilateral upper extremity supported;During functional activity Standing balance-Leahy Scale: Poor Standing balance comment: Relies on Bil UE support and assist to remain steady.                             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  Cervical / Trunk Assessment: Normal  Communication   Communication: Expressive difficulties  Cognition Arousal/Alertness: Awake/alert Behavior During Therapy: Flat affect Overall Cognitive Status: Difficult to assess                      General Comments General comments (skin integrity, edema, etc.): Pt w/ excess drooling and constantly using suction forecfully.  Instructed pt to use suction only when needed and to be gentle.  Provided pt w/ washcloths and RN notified.    Exercises General Exercises - Lower Extremity Long Arc Quad: Strengthening;Left;10 reps;Seated;AROM;Other (comment) (w/ focus on eccentric strengthening)      Assessment/Plan    PT Assessment Patient needs continued PT services  PT Diagnosis Hemiplegia non-dominant side;Difficulty walking   PT Problem List Decreased strength;Decreased activity tolerance;Decreased balance;Decreased mobility;Decreased coordination;Decreased cognition;Decreased knowledge of use of DME;Decreased safety awareness  PT Treatment Interventions DME instruction;Gait training;Stair training;Functional mobility training;Therapeutic activities;Therapeutic exercise;Balance training;Neuromuscular re-education;Cognitive remediation;Patient/family education;Wheelchair mobility training   PT Goals (Current goals can be found in the Care Plan section) Acute Rehab PT Goals Patient Stated Goal: none stated PT Goal Formulation: With patient Time For Goal Achievement: 11/09/15 Potential to Achieve Goals: Good    Frequency Min 4X/week   Barriers to discharge Decreased caregiver support Intermittent assist available    Co-evaluation               End of Session Equipment Utilized During Treatment: Gait belt Activity  Tolerance: Patient limited by fatigue Patient left: in chair;with call bell/phone within reach;with chair alarm set Nurse Communication: Mobility status         Time: 1421-1447 PT Time Calculation (min) (ACUTE ONLY): 26 min   Charges:   PT Evaluation $PT Eval Moderate Complexity: 1 Procedure PT Treatments $Therapeutic Activity: 8-22 mins   PT G Codes:       Collie Siad PT, DPT  Pager: [phone] Phone: [phone] 10/26/2015, 4:24 PM

## 2015-10-27 ENCOUNTER — Inpatient Hospital Stay (HOSPITAL_COMMUNITY): Payer: Medicare Other

## 2015-10-27 DIAGNOSIS — I639 Cerebral infarction, unspecified: Secondary | ICD-10-CM

## 2015-10-27 DIAGNOSIS — J690 Pneumonitis due to inhalation of food and vomit: Secondary | ICD-10-CM

## 2015-10-27 DIAGNOSIS — G8194 Hemiplegia, unspecified affecting left nondominant side: Secondary | ICD-10-CM

## 2015-10-27 DIAGNOSIS — R05 Cough: Secondary | ICD-10-CM

## 2015-10-27 DIAGNOSIS — I69921 Dysphasia following unspecified cerebrovascular disease: Secondary | ICD-10-CM

## 2015-10-27 DIAGNOSIS — I63511 Cerebral infarction due to unspecified occlusion or stenosis of right middle cerebral artery: Principal | ICD-10-CM

## 2015-10-27 DIAGNOSIS — R1310 Dysphagia, unspecified: Secondary | ICD-10-CM

## 2015-10-27 DIAGNOSIS — R059 Cough, unspecified: Secondary | ICD-10-CM

## 2015-10-27 LAB — GLUCOSE, CAPILLARY
GLUCOSE-CAPILLARY: 101 mg/dL — AB (ref 65–99)
Glucose-Capillary: 99 mg/dL (ref 65–99)
Glucose-Capillary: 88 mg/dL (ref 65–99)
Glucose-Capillary: 90 mg/dL (ref 65–99)
Glucose-Capillary: 113 mg/dL — ABNORMAL HIGH (ref 65–99)

## 2015-10-27 LAB — URINALYSIS, ROUTINE W REFLEX MICROSCOPIC
GLUCOSE, UA: NEGATIVE mg/dL
KETONES UR: 15 mg/dL — AB
Leukocytes, UA: NEGATIVE
Nitrite: NEGATIVE
Specific Gravity, Urine: 1.026 (ref 1.005–1.030)
pH: 6 (ref 5.0–8.0)

## 2015-10-27 LAB — CBC
HEMATOCRIT: 39.7 % (ref 39.0–52.0)
Hemoglobin: 12.8 g/dL — ABNORMAL LOW (ref 13.0–17.0)
MCH: 27 pg (ref 26.0–34.0)
MCHC: 32.2 g/dL (ref 30.0–36.0)
MCV: 83.8 fL (ref 78.0–100.0)
PLATELETS: 232 10*3/uL (ref 150–400)
RBC: 4.74 MIL/uL (ref 4.22–5.81)
RDW: 15.5 % (ref 11.5–15.5)
WBC: 12 10*3/uL — ABNORMAL HIGH (ref 4.0–10.5)

## 2015-10-27 LAB — BASIC METABOLIC PANEL
Anion gap: 9 (ref 5–15)
BUN: 18 mg/dL (ref 6–20)
CALCIUM: 9.2 mg/dL (ref 8.9–10.3)
CO2: 21 mmol/L — AB (ref 22–32)
Chloride: 111 mmol/L (ref 101–111)
Creatinine, Ser: 1.17 mg/dL (ref 0.61–1.24)
GFR calc non Af Amer: >60 mL/min (ref >60)
GLUCOSE: 96 mg/dL (ref 65–99)
Potassium: 3.8 mmol/L (ref 3.5–5.1)
Sodium: 141 mmol/L (ref 135–145)

## 2015-10-27 LAB — URINE MICROSCOPIC-ADD ON

## 2015-10-27 MED ORDER — SODIUM CHLORIDE 0.9 % IV SOLN: INTRAVENOUS | Status: AC

## 2015-10-27 MED ORDER — PANTOPRAZOLE SODIUM 40 MG PO PACK
40.0000 mg | PACK | Freq: Every day | ORAL | Status: DC
  Administered 2015-10-27 – 2015-11-02 (×6): 40 mg
  Filled 2015-10-27 (×6): qty 20

## 2015-10-27 MED ORDER — ASPIRIN 325 MG PO TABS
325.0000 mg | ORAL_TABLET | Freq: Every day | ORAL | Status: DC
  Administered 2015-10-27 – 2015-11-01 (×5): 325 mg via NASOGASTRIC
  Filled 2015-10-27 (×5): qty 1

## 2015-10-27 NOTE — Consult Note (Addendum)
Physical Medicine and Rehabilitation Consult Reason for Consult: Acute right MCA territory infarct Referring Physician: [facility]   HPI: Sean Collins is a 65 y.o. right handed male with history of hypertension, hyperlipidemia, diet-controlled type 2 diabetes mellitus, tobacco abuse as well as CVA in 2013 and September 2016 maintained on aspirin 81 mg and Plavix. Per chart Patient lives with spouse independent prior to admission. 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 MRI showed acute right MCA territory lenticulostriate infarct, nonhemorrhagic. Scattered foci of chronic hemorrhagic and nonhemorrhagic lacunar infarct. MRA with no proximal large vessel occlusion or stenosis. CT angiogram the neck showed 50% diameter stenosis at the origin of the right internal carotid artery. 15 mm distally there is another 50% diameter stenosis due to noncalcified atherosclerotic plaque. 40% diameter stenosis proximal left ICA. Patient did not receive TPA. Carotid Dopplers with right 40-59% ICA stenosis. Echocardiogram with ejection fraction of 60% no wall motion abnormalities. Neurology services consulted maintained on aspirin and Plavix for CVA prophylaxis. Subcutaneous Lovenox for DVT prophylaxis. Follow-up MRI completed 10/25/2015 due to question increased left-sided deficits that showed interval enlargement of right posterior corona radiata posterior right lenticular nucleus nonhemorrhagic infarct. Continued plan of care of aspirin and Plavix. Lower extremity Dopplers with no signs of DVT. Vascular surgery consulted in regards to right ICA stenosis plan follow-up outpatient. Presently NPO and speech therapy follow-up for dysphagia. Physical occupational therapy evaluations completed 10/26/2015 with recommendations of physical medicine rehabilitation consult.   Review of Systems  Constitutional: Negative for fever and chills.    HENT: Negative for hearing loss.   Eyes: Negative for blurred vision and double vision.  Respiratory: Negative for cough and shortness of breath.   Cardiovascular: Positive for leg swelling. Negative for chest pain and palpitations.  Gastrointestinal: Positive for constipation. Negative for nausea and vomiting.  Genitourinary: Positive for urgency. Negative for dysuria and hematuria.  Musculoskeletal: Positive for myalgias and joint pain.  Skin: Negative for rash.  Neurological: Positive for dizziness, speech change, weakness and headaches. Negative for seizures and loss of consciousness.  All other systems reviewed and are negative.  Past Medical History  Diagnosis Date  . Hypertension   . Hyperlipidemia   . Stroke ([place])   . Diabetes mellitus without complication ([place])    History reviewed. No pertinent past surgical history. Family History  Problem Relation Age of Onset  . Diabetes Brother    Social History:  reports that he has been smoking Cigarettes.  He has a 16 pack-year smoking history. He does not have any smokeless tobacco history on file. He reports that he does not drink alcohol or use illicit drugs. Allergies: No Known Allergies Medications Prior to Admission  Medication Sig Dispense Refill  . allopurinol (ZYLOPRIM) 300 MG tablet Take 300 mg by mouth daily.    Marland Kitchen aspirin EC 81 MG tablet Take 81 mg by mouth daily.     Marland Kitchen atenolol (TENORMIN) 50 MG tablet Take 1 tablet (50 mg total) by mouth 2 (two) times daily. 60 tablet 2  . atorvastatin (LIPITOR) 20 MG tablet Take 1 tablet (20 mg total) by mouth daily at 6 PM. 30 tablet 0  . carboxymethylcellulose (REFRESH PLUS) 0.5 % SOLN Place 1 drop into both eyes 3 (three) times daily as needed (dry eyes).    . cholecalciferol (VITAMIN D) 1000 UNITS tablet Take 1,000 Units by mouth daily.    . clopidogrel (PLAVIX) 75  MG tablet Take 1 tablet (75 mg total) by mouth daily. 30 tablet 0  . enalapril (VASOTEC) 20 MG tablet Take 1 tablet  (20 mg total) by mouth daily. 30 tablet 2  . folic acid (FOLVITE) 1 MG tablet Take 1 mg by mouth daily.    Marland Kitchen omeprazole (PRILOSEC) 20 MG capsule Take 20 mg by mouth daily.    . [DISCONTINUED] amLODipine (NORVASC) 10 MG tablet Take 0.5 tablets (5 mg total) by mouth daily. (Patient taking differently: Take 10 mg by mouth daily. 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  Transfers Overall transfer level: Needs assistance Equipment used: Rolling walker (2 wheeled) Transfers: Sit to/from Stand Sit to Stand: Max assist Stand pivot transfers: Mod assist, +2 physical assistance General transfer comment: assist given for balance upon standing.  Ambulation/Gait Ambulation/Gait assistance: Modified independent (Device/Increase time) Ambulation Distance (Feet): 250 Feet Assistive device: None Gait Pattern/deviations: WFL(Within Functional Limits) General Gait Details: Deferred as pt fatigues w/ pivot to chair. Gait velocity: decr Gait velocity interpretation: Below normal speed for age/gender Stairs: Yes Stairs assistance: Independent Stair  Management: No rails, Forwards, Alternating pattern Number of Stairs: 5    ADL: ADL Overall ADL's : Needs assistance/impaired Lower Body Dressing: Maximal assistance, Sit to/from stand Toilet Transfer: RW, Maximal assistance (sit to stand from bed) Functional mobility during ADLs: Rolling walker, Maximal assistance (sit to stand from bed)  Cognition: Cognition Overall Cognitive Status: Difficult to assess Orientation Level: Oriented X4 Attention: Sustained Sustained Attention: Impaired (versus lethergy ) Sustained Attention Impairment: Functional basic Memory: Appears intact Awareness: Appears intact Problem Solving: Appears intact Safety/Judgment: Appears intact Cognition Arousal/Alertness: Awake/alert Behavior During Therapy: Flat affect Overall Cognitive Status: Difficult to assess Difficult to assess due to: Impaired communication  Blood pressure 143/78, pulse 79, temperature 99.4 F (37.4 C), temperature source Axillary, resp. rate 20, height 5\' 9"  (1.753 m), weight 77.066 kg (169 lb 14.4 oz), SpO2 98 %. Physical Exam  Vitals reviewed. HENT:  Left facial droop  Eyes:  Pupils reactive to light  Neck: Normal range of motion. Neck supple. No thyromegaly present.  Cardiovascular: Normal rate and regular rhythm.   Respiratory: Effort normal and breath sounds normal. No respiratory distress.  GI: Soft. Bowel sounds are normal. He exhibits no distension.  Neurological:  Patient is lethargic but arousable. He would fall back asleep during exam. Severe dysarthria. He was able to state his name. Follows simple commands but inconsistent  Skin: Skin is warm and dry.  Bilateral hand intrinsic atrophy 0/5 LUE Trace Left knee ext, 0/5 at ankle 5/5 on Right except grip is 4/5 Results for orders placed or performed during the hospital encounter of 10/23/15 (from the past 24 hour(s))  Basic metabolic panel     Status:  Abnormal   Collection Time: 10/26/15  6:38 AM  Result Value  Ref Range   Sodium 141 135 - 145 mmol/L   Potassium 4.0 3.5 - 5.1 mmol/L   Chloride 106 101 - 111 mmol/L   CO2 24 22 - 32 mmol/L   Glucose, Bld 100 (H) 65 - 99 mg/dL   BUN 16 6 - 20 mg/dL   Creatinine, Ser 1.26 (H) 0.61 - 1.24 mg/dL   Calcium 9.8 8.9 - 10.3 mg/dL   GFR calc non Af Amer 58 (L) >60 mL/min   GFR calc Af Amer >60 >60 mL/min   Anion gap 11 5 - 15  CBC     Status: Abnormal   Collection Time: 10/26/15  6:38 AM  Result Value Ref Range   WBC 12.4 (H) 4.0 - 10.5 K/uL   RBC 5.06 4.22 - 5.81 MIL/uL   Hemoglobin 13.6 13.0 - 17.0 g/dL   HCT 41.7 39.0 - 52.0 %   MCV 82.4 78.0 - 100.0 fL   MCH 26.9 26.0 - 34.0 pg   MCHC 32.6 30.0 - 36.0 g/dL   RDW 15.3 11.5 - 15.5 %   Platelets 241 150 - 400 K/uL  Glucose, capillary     Status: Abnormal   Collection Time: 10/26/15  8:20 AM  Result Value Ref Range   Glucose-Capillary 111 (H) 65 - 99 mg/dL   Comment 1 Notify RN    Comment 2 Document in Chart   Glucose, capillary     Status: None   Collection Time: 10/26/15 11:47 AM  Result Value Ref Range   Glucose-Capillary 85 65 - 99 mg/dL   Comment 1 Notify RN    Comment 2 Document in Chart   Glucose, capillary     Status: None   Collection Time: 10/26/15  4:52 PM  Result Value Ref Range   Glucose-Capillary 76 65 - 99 mg/dL   Comment 1 Notify RN    Comment 2 Document in Chart   Glucose, capillary     Status: Abnormal   Collection Time: 10/26/15  7:52 PM  Result Value Ref Range   Glucose-Capillary 125 (H) 65 - 99 mg/dL   Comment 1 Notify RN    Comment 2 Document in Chart   Glucose, capillary     Status: None   Collection Time: 10/26/15 11:46 PM  Result Value Ref Range   Glucose-Capillary 91 65 - 99 mg/dL   Comment 1 Notify RN    Comment 2 Document in Chart   Urinalysis, Routine w reflex microscopic (not at [facility])     Status: Abnormal   Collection Time: 10/27/15  1:15 AM  Result Value Ref Range   Color, Urine YELLOW YELLOW   APPearance HAZY (A) CLEAR   Specific Gravity, Urine  1.026 1.005 - 1.030   pH 6.0 5.0 - 8.0   Glucose, UA NEGATIVE NEGATIVE mg/dL   Hgb urine dipstick TRACE (A) NEGATIVE   Bilirubin Urine SMALL (A) NEGATIVE   Ketones, ur 15 (A) NEGATIVE mg/dL   Protein, ur >300 (A) NEGATIVE mg/dL   Nitrite NEGATIVE NEGATIVE   Leukocytes, UA NEGATIVE NEGATIVE  Urine microscopic-add on     Status: Abnormal   Collection Time: 10/27/15  1:15 AM  Result Value Ref Range   Squamous Epithelial / LPF 0-5 (A) NONE SEEN   WBC, UA 0-5 0 - 5 WBC/hpf   RBC / HPF 0-5 0 - 5 RBC/hpf   Bacteria, UA FEW (A) NONE SEEN   Casts HYALINE  CASTS (A) NEGATIVE   Urine-Other MUCOUS PRESENT   Glucose, capillary     Status: Abnormal   Collection Time: 10/27/15  3:44 AM  Result Value Ref Range   Glucose-Capillary 101 (H) 65 - 99 mg/dL   Comment 1 Notify RN    Comment 2 Document in Chart    Dg Chest 2 View  10/26/2015  CLINICAL DATA:  Dysphagia.  Cough and congestion today. EXAM: CHEST  2 VIEW COMPARISON:  None. FINDINGS: Cardiomediastinal silhouette is normal. Mediastinal contours appear intact. There is no evidence of pneumothorax. There are low lung volumes. Linear peribronchovascular and patchy airspace consolidation is seen in the left lower lobe. More subtle consolidation is noted in the right lower lobe. Osseous structures are without acute abnormality. Soft tissues are grossly normal. IMPRESSION: Low lung volumes. Bilateral lower lobes airspace consolidation, worse on the left, concerning for pneumonia. Given the distribution of airspace consolidation, aspiration pneumonia becomes a leading consideration. Electronically Signed   By: Fidela Salisbury M.D.   On: 10/26/2015 10:33   Mr Brain Wo Contrast  10/25/2015  CLINICAL DATA:  65 year old hypertensive diabetic male with hyperlipidemia presenting with worsening of left-sided deficits and slurred speech. Subsequent encounter. EXAM: MRI HEAD WITHOUT CONTRAST TECHNIQUE: Multiplanar, multiecho pulse sequences of the brain and  surrounding structures were obtained without intravenous contrast. COMPARISON:  10/24/2015 CT angiogram head and neck. 10/23/2015 in 01/2016 2016 brain MR. FINDINGS: Interval enlargement of right posterior corona radiata/ posterior right lenticular nucleus nonhemorrhagic infarct. Minimal deposition lenticular nucleus bilaterally similar to the prior exam. Remote left frontal lobe infarct with encephalomalacia. Remote corona radiata and basal ganglia infarcts bilaterally. Moderate global atrophy without hydrocephalus. Major intracranial vascular structures are patent. No intracranial mass lesion noted on this unenhanced exam. Complex persistent opacification mid to posterior left ethmoid sinus air cells. Mild mucosal thickening remainder of ethmoid sinus air cells. Minimal to mild mucosal thickening frontal sinuses. Minimal maxillary sinus mucosal thickening. IMPRESSION: Interval enlargement of right posterior corona radiata/ posterior right lenticular nucleus nonhemorrhagic infarct. Remote left frontal lobe infarct with encephalomalacia. Remote corona radiata and basal ganglia infarcts bilaterally. Moderate global atrophy without hydrocephalus. Complex persistent opacification mid to posterior left ethmoid sinus air cells. Mild mucosal thickening remainder of ethmoid sinus air cells. Minimal to mild mucosal thickening frontal sinuses. Minimal maxillary sinus mucosal thickening. Electronically Signed   By: Genia Del M.D.   On: 10/25/2015 12:18   Dg Abd Portable 1v  10/26/2015  CLINICAL DATA:  Encounter for feeding tube placement; EXAM: PORTABLE ABDOMEN - 1 VIEW COMPARISON:  10/26/2015 FINDINGS: Feeding tube extends into the first portion duodenum/gastric antrum. Bibasilar lung opacities. IMPRESSION: Feeding tube in gastric antrum/ first portion duodenum. Electronically Signed   By: Suzy Bouchard M.D.   On: 10/26/2015 16:45    Assessment/Plan: Diagnosis: Right lenticulostriate and corona radiata infarct  with left hemiparesis and severe dysphagia 1. Does the need for close, 24 hr/day medical supervision in concert with the patient's rehab needs make it unreasonable for this patient to be served in a less intensive setting? Yes 2. Co-Morbidities requiring supervision/potential complications: HT, DM2 3. Due to bladder management, bowel management, safety, skin/wound care, disease management, medication administration, pain management and patient education, does the patient require 24 hr/day rehab nursing? Yes 4. Does the patient require coordinated care of a physician, rehab nurse, PT (1-2 hrs/day, 5 days/week), OT (1-2 hrs/day, 5 days/week) and SLP (.5-1 hrs/day, 5 days/week) to address physical and functional deficits in the context of the above  medical diagnosis(es)? Yes Addressing deficits in the following areas: balance, endurance, locomotion, strength, transferring, bowel/bladder control, bathing, dressing, feeding, grooming, toileting, cognition, speech, swallowing and psychosocial support 5. Can the patient actively participate in an intensive therapy program of at least 3 hrs of therapy per day at least 5 days per week? Yes 6. The potential for patient to make measurable gains while on inpatient rehab is good 7. Anticipated functional outcomes upon discharge from inpatient rehab are supervision  with PT, supervision with OT, supervision and possible limted po with SLP. 8. Estimated rehab length of stay to reach the above functional goals is: 19-22d 9. Does the patient have adequate social supports and living environment to accommodate these discharge functional goals? Yes 10. Anticipated D/C setting: Home 11. Anticipated post D/C treatments: [place] therapy 12. Overall Rehab/Functional Prognosis: good  RECOMMENDATIONS: This patient's condition is appropriate for continued rehabilitative care in the following setting: CIR Patient has agreed to participate in recommended program. Yes Note that  insurance prior authorization may be required for reimbursement for recommended care.  Comment: Will need PEG, would rec placement prior to CIR    10/27/2015

## 2015-10-27 NOTE — Progress Notes (Addendum)
Speech Language Pathology Treatment: Dysphagia  Patient Details Name: Sean Collins MRN: [id number] DOB: July 31, 1950 Today's Date: 10/27/2015 Time: [id number] SLP Time Calculation (min) (ACUTE ONLY): 22 min  Assessment / Plan / Recommendation Clinical Impression  Dysphagia treatment provided to assess PO readiness. Upon entering room pt presented with a wet vocal quality; cued pt to cough which was productive. Provided oral care with suction. Pt continues to present with severe dysarthria and used a combination of gestures and writing to communicate effectively. Provided trials of ice chips; no evidence of pharyngeal swallow initiation noted on either trial; suction provided to remove liquid from oral cavity. Pt nodded indicating understanding NPO status; however later requested a soda. Provided further education on rationale of NPO status and high risk of aspiration with anything by mouth. Pt needs to continue to be NPO with meds via alternative means. Will continue to follow for PO readiness and speech treatment.    HPI HPI: Sean Collins is a 65 year old gentleman who presents with dysarthria. 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 He had associated lightheadedness and gait instability where he would "fade" to the left when trying to walk straight. MRI-Acute RIGHT MCA territory lenticulostriate infarct. Patient with worsening speech and LUE symptoms and a repeat MRI showed more prominent right BG/CR infarct. CTA showed right ICA soft plaque with 50% stenosis. PMHx-CVA in September 2016 where MRI showed multiple acute tiny right posterior frontal lobe infarcts in the MCA distribution, multiple CVAs, Lt hip fx, HTN, HLD, diet controlled T2DM, and tobacco use.      SLP Plan  Continue with current plan of care     Recommendations  Diet recommendations: NPO Medication Administration: Via alternative means            Oral Care Recommendations: Staff/trained caregiver to provide oral care Follow up Recommendations: Inpatient Rehab Plan: Continue with current plan of care     [place], Kenlynn Houde K, [place], CCC-SLP 10/27/2015, 10:37 AM [phone]

## 2015-10-27 NOTE — Progress Notes (Signed)
Subjective: Patient removed NG tube overnight. He is still dysarthric, difficult to understand speech, although he does follow commands appropriately with functional extremities. A scopolamine patch was placed overnight and RN reports some improvement in secretions compared to before. PT/OT have recommended CIR. VVS deferring CEA as an outpatient procedure.  Objective: Vital signs in last 24 hours: Filed Vitals:   10/26/15 2114 10/27/15 0114 10/27/15 0455 10/27/15 0531  BP: 149/78 144/72  143/78  Pulse: 76 73  79  Temp: 99.3 F (37.4 C) 99 F (37.2 C)  99.4 F (37.4 C)  TempSrc: Axillary Axillary  Axillary  Resp: 20 18  20   Height:      Weight:   169 lb 14.4 oz (77.066 kg)   SpO2: 97% 98%  98%   Weight change:   Intake/Output Summary (Last 24 hours) at 10/27/15 1139 Last data filed at 10/27/15 0532  Gross per 24 hour  Intake      0 ml  Output    900 ml  Net   -900 ml   General: resting in bed HEENT: PERRL, slow vertical extraocular movments Cardiac: RRR, no rubs, murmurs or gallops Pulm: clear breath sounds bilaterally , moving normal volumes of air Abd: soft, nontender Ext: warm and well perfused, no pedal edema Neuro: dysarthric with nearly unintelligible speech, strength 0/5 LUE, LLE 1/5, 5/5 right upper and lower extremities, absent tongue protrusion, left facial droop  Assessment/Plan: Principal Problem:   CVA (cerebral infarction) Active Problems:   Hypertension associated with diabetes (HCC)   Dysarthria   Tobacco use disorder   HLD (hyperlipidemia)   Dysphagia due to recent stroke   Aspiration pneumonia of both lower lobes (H[facility])   Cerebral infarction due to unspecified mechanism   Left hemiparesis ([place])  Acute Right MCA territory lenticulostriate infarct: Seen on MRI brain 6/17. TTE showed an EF of 55-60%, PA peak pressure 55 mmHg, no comment on thrombus. Preliminary carotid duplex shows Right ICA 40-59% stenosis, Left ICA 1-39% stenosis. Preliminary LE  dopplers without DVT or SVT. CTA neck shows 50% stenosis at origin of Right ICA as well as 15 mm distally. Also shows 40% stenosis at proximal Left ICA. He has known atherosclerosis which is the most likely cause for his recurrent strokes. Repeat MRI on 6/19 showed evolution of stroke, no new infarct. VVS deferring CEA as an outpatient procedure given current neurological status. Patient now has essentially no function in LUE and minimal movement against gravity with LLE and continued severe dysarthria. -Neurology and VVS consulted, appreciate assistance and recommendations -PT/OT recommending CIR, appreciate assistance -Permissive HTN <220/120 -Aspirin increased to 325 mg daily per neurology -Continue Plavix 75 mg -recommend 30 day holter monitor outpatient -f/u with VVS outpatient in 4-6 weeks  Dysphagia and Aspiration Pneumonia: CXR 6/20 concerning for aspiration pneumonia. Started antibiotics given patient's dysphagia, elevated white count, and worsened neurological deficits. Would transition to oral Augmentin when improving clinically and able to take oral meds to complete a 7 day course. Patient removed NG tube last night, although he states he did not mean to and seems to understand reasoning for placement and agrees to NG tube placement again. PMR has been consulted and agree that patient is a candidate for CIR and are recommending PEG placement prior to CIR admission. Will discuss with SLP current aspiration risk with NG tube feeds and initiate process towards PEG placement. -IV Unasyn per pharmacy (6/20 >>) -Monitor CBC, BMP -IV NS @ 125 mL/hr -Diet strict NPO, unable to complete  MBS due to inability to control secretions -SLP following, appreciate assistance -Place NG tube, meds per tube  HTN: Takes Enalapril 20 mg qd, Atenolol 50 mg BID, Amlodipine 10 mg daily -Hold meds for permissive HTN  HLD: Takes Lipitor 20 mg  -Increased Lipitor to 40 mg    Dispo:  Deferred at this time,  awaiting improvement of current medical problems, needs to con  The patient does have a current PCP with the [facility] (Provider Default, MD) and does not need an [facility] hospital follow-up appointment after discharge.  The patient does not have transportation limitations that hinder transportation to clinic appointments.    LOS: 4 days   Zada Finders, MD 10/27/2015, 11:39 AM

## 2015-10-27 NOTE — Progress Notes (Signed)
I contacted pt's Next of kin who is his brother, by phone, [facility]. He states pt is divorced and lives alone essentially. Has a room mate who can not offer any assistance. Patrick [place] states pt has no children. Brother states he can not care for pt in his home. He requests SNF rehab. I have alerted SW, Marjorie Smolder of this requests. Pt currently has feeding tube and this will need to be converted to PEG for placement. NO bed currently available to admit this pt to inpt rehab. [phone]

## 2015-10-27 NOTE — Progress Notes (Signed)
Physical Therapy Treatment Patient Details Name: Sean Collins MRN: [id number] DOB: 07-Feb-1951 Today's Date: 10/27/2015    History of Present Illness Pt is a 65 y.o. M who presented with dysarthria. MRI revealed Acute RIGHT MCA territory lenticulostriate infarct.  Pt w/ increased weakness Lt UE/LE noted on 6/19 and MRI on 6/19 noted enlargement of Rt posterior corona radiata/posterior Rt lenticular nucleus nonhemorrhagic infarct.  Pt's PMH includes CVA in September 2016 where MRI showed multiple acute tiny right posterior frontal lobe infarcts in the MCA distribution, multiple CVAs, Lt hip fx, HTN, HLD, diet controlled T2DM, and tobacco use.    PT Comments    Patient lethargic today but able to awaken once sitting EOB. Requires assist of 2 to perform SPT to chair due to LLE weakness. No functional movement noted in LLE. Difficult to obtain information due to impaired speech. Tolerated dynamic sitting EOB but requires variable assist- Min guard assist-total A due to LOB posteriorly. Will continue to follow.   Follow Up Recommendations  CIR     Equipment Recommendations  Other (comment) (TBA)    Recommendations for Other Services OT consult;Speech consult     Precautions / Restrictions Precautions Precautions: Fall Restrictions Weight Bearing Restrictions: No    Mobility  Bed Mobility Overal bed mobility: Needs Assistance Bed Mobility: Supine to Sit     Supine to sit: Max assist;HOB elevated     General bed mobility comments: assist with trunk and LEs when coming to sitting position. No initiation or movement of LUE/LE.  Transfers Overall transfer level: Needs assistance   Transfers: Sit to/from Stand Sit to Stand: Max assist;+2 physical assistance Stand pivot transfers: Max assist;+2 physical assistance       General transfer comment: Assist of 2 to perform SPT to chair with no active movement/pivoting of LLE.  Ambulation/Gait                  Stairs            Wheelchair Mobility    Modified Rankin (Stroke Patients Only) Modified Rankin (Stroke Patients Only) Pre-Morbid Rankin Score: No significant disability Modified Rankin: Severe disability     Balance Overall balance assessment: Needs assistance Sitting-balance support: Feet supported;Single extremity supported Sitting balance-Leahy Scale: Zero Sitting balance - Comments: Variable assist sitting EOB- close Min guard-total A. Cues for leaning anteriorly and for upright as pt with LOB posteriorly and to the left x3. Assisted with changing gown.  Postural control: Posterior lean;Left lateral lean Standing balance support: During functional activity Standing balance-Leahy Scale: Zero Standing balance comment: Requires Max A of 2 to stand with therapist supporting LLE.                     Cognition Arousal/Alertness: Lethargic Behavior During Therapy: Flat affect Overall Cognitive Status: Difficult to assess                      Exercises      General Comments        Pertinent Vitals/Pain Pain Assessment: Faces Faces Pain Scale: No hurt    Home Living                      Prior Function            PT Goals (current goals can now be found in the care plan section) Progress towards PT goals: Progressing toward goals (slowly)    Frequency  Min 4X/week  PT Plan Current plan remains appropriate    Co-evaluation             End of Session Equipment Utilized During Treatment: Gait belt Activity Tolerance: Patient limited by lethargy;Patient limited by fatigue Patient left: in chair;with call bell/phone within reach;with chair alarm set;Other (comment) (MDs present)     Time: [id number] PT Time Calculation (min) (ACUTE ONLY): 21 min  Charges:  $Therapeutic Activity: 8-22 mins                    G Codes:      Ayodele Sangalang A Ariele Vidrio 10/27/2015, 10:40 AM Wray Kearns, PT, DPT [phone]

## 2015-10-27 NOTE — Progress Notes (Signed)
Patient seen and examined. Case d/w residents in detail. I agree with findings and plan as documented in Dr. Serita Grit note.  Patient with worsening left sided weakness and persistent dysarthria and dysphagia. PT/OT recommending CIR. Will need G tube placement prior to CIR. Will place NG tube for now and consult IR for G tube placement. Will start feeds via NG tube and c/w meds via NG for now. C/w asa, plavix. Given worsening weakness and new aspiration PNA he is not a candidate for CEA at this time. He will f/u in 4-6 weeks with Dr. Oneida Alar and be reassessed at that time for possible CEA.  Will c/w IV unasyn for likely aspiration PNA .

## 2015-10-27 NOTE — Progress Notes (Signed)
STROKE TEAM PROGRESS NOTE   SUBJECTIVE (INTERVAL HISTORY) Pt still has dysarthria, dysphagia and did not pass swallow. Still has copious secretions. NG tube pulled out last night, cortrak was placed today. Will start TF. Left sided hemiparesis seems also worsened than yesterday.  Cre normalized and WBC stable.    OBJECTIVE Temp:  [98.3 F (36.8 C)-99.4 F (37.4 C)] 98.3 F (36.8 C) (06/21 2141) Pulse Rate:  [73-83] 78 (06/21 2141) Cardiac Rhythm:  [-] Normal sinus rhythm (06/21 1940) Resp:  [18-20] 20 (06/21 2141) BP: (143-183)/(72-89) 145/84 mmHg (06/21 2141) SpO2:  [98 %-100 %] 100 % (06/21 2141) Weight:  [169 lb 14.4 oz (77.066 kg)] 169 lb 14.4 oz (77.066 kg) (06/21 0455)  CBC:   Recent Labs Lab 10/23/15 1442  10/26/15 0638 10/27/15 0602  WBC 7.3  --  12.4* 12.0*  NEUTROABS 4.0  --   --   --   HGB 12.2*  < > 13.6 12.8*  HCT 37.5*  < > 41.7 39.7  MCV 82.6  --  82.4 83.8  PLT 217  --  241 232  < > = values in this interval not displayed.  Basic Metabolic Panel:   Recent Labs Lab 10/26/15 0638 10/27/15 0602  NA 141 141  K 4.0 3.8  CL 106 111  CO2 24 21*  GLUCOSE 100* 96  BUN 16 18  CREATININE 1.26* 1.17  CALCIUM 9.8 9.2    Lipid Panel:     Component Value Date/Time   CHOL 138 10/24/2015 0502   TRIG 59 10/24/2015 0502   HDL 43 10/24/2015 0502   CHOLHDL 3.2 10/24/2015 0502   VLDL 12 10/24/2015 0502   LDLCALC 83 10/24/2015 0502   HgbA1c:  Lab Results  Component Value Date   HGBA1C 6.1* 10/23/2015   Urine Drug Screen:     Component Value Date/Time   LABOPIA NONE DETECTED 10/23/2015 1530   COCAINSCRNUR NONE DETECTED 10/23/2015 1530   LABBENZ NONE DETECTED 10/23/2015 1530   AMPHETMU NONE DETECTED 10/23/2015 1530   THCU NONE DETECTED 10/23/2015 1530   LABBARB NONE DETECTED 10/23/2015 1530      IMAGING I have personally reviewed the radiological images below and agree with the radiology interpretations.  Ct Head Wo Contrast 10/23/2015   No acute  intracranial process. Chronic microvascular ischemic changes and cortical atrophy.   Mri & Mra Head/brain Wo Cm 10/23/2015   Acute RIGHT MCA territory lenticulostriate infarct, nonhemorrhagic. Atrophy and small vessel disease. Scattered foci of chronic hemorrhagic and nonhemorrhagic lacunar infarcts. Incompletely evaluated LEFT ethmoid air cell opacity, possible developing mucocele.  No proximal large vessel occlusion or flow-limiting stenosis. Intracranial atherosclerotic change most notable in the LEFT A1 ACA and LEFT P2 PCA, as well as BILATERAL MCA M2 and M3 vessels.  CTA neck 50% diameter stenosis at the origin of the right internal carotid artery. 15 mm distally there is another 50% diameter stenosis due to noncalcified atherosclerotic plaque. 40% diameter stenosis proximal left internal carotid artery. Mild stenosis at the proximal vertebral artery bilaterally. Both vertebral arteries patent.  CUS - Right: 40-59% ICA stenosis (mid to distal),highest end of scale, however, higher velocities may be obscured as patient has high bifurcation and significant plaquing. Left: 1-39% ICA stenosis. Unable to adequately visualize the distal ICA secondary to high bifurcation. Bilateral vertebral artery flow is antegrade.   LE venous doppler - There is no DVT or SVT noted in the bilateral lower extremities.   TTE  - Left ventricle: The cavity size was  normal. Wall thickness was normal. Systolic function was normal. The estimated ejection fraction was in the range of 55% to 60%. Wall motion was normal; there were no regional wall motion abnormalities. Left ventricular diastolic function parameters were normal. - Mitral valve: Calcified annulus. Mildly thickened leaflets . - Left atrium: The atrium was mildly dilated. - Right atrium: The atrium was mildly dilated. - Atrial septum: No defect or patent foramen ovale was identified. - Tricuspid valve: There was moderate regurgitation. - Pulmonary  arteries: PA peak pressure: 55 mm Hg (S). - Pericardium, extracardiac: A trivial pericardial effusion was identified.  Ct Angio Neck W Or Wo Contrast 10/24/2015  IMPRESSION: 50% diameter stenosis at the origin of the right internal carotid artery. 15 mm distally there is another 50% diameter stenosis due to noncalcified atherosclerotic plaque. 40% diameter stenosis proximal left internal carotid artery. Mild stenosis at the proximal vertebral artery bilaterally. Both vertebral arteries patent.    Mr Brain Wo Contrast 10/25/2015  IMPRESSION: Interval enlargement of right posterior corona radiata/ posterior right lenticular nucleus nonhemorrhagic infarct. Remote left frontal lobe infarct with encephalomalacia. Remote corona radiata and basal ganglia infarcts bilaterally. Moderate global atrophy without hydrocephalus. Complex persistent opacification mid to posterior left ethmoid sinus air cells. Mild mucosal thickening remainder of ethmoid sinus air cells. Minimal to mild mucosal thickening frontal sinuses. Minimal maxillary sinus mucosal thickening.    PHYSICAL EXAM General - Well nourished, well developed, in no apparent distress.  Ophthalmologic - Fundi not visualized due to noncooperation.  Cardiovascular - Regular rate and rhythm.  Mental Status -  Level of arousal and orientation to time, place, and person were intact. Language including expression, naming, repetition, comprehension was assessed and found intact, moderate to severe dysarthria. Fund of Knowledge was assessed and was intact.  Cranial Nerves II - XII - II - Visual field intact OU. III, IV, VI - Extraocular movements intact. V - Facial sensation intact bilaterally. VII - left nasolabial fold flattening. VIII - Hearing & vestibular intact bilaterally. X - Palate elevates symmetrically, moderate to severe dysarthria. XI - Chin turning & shoulder shrug intact bilaterally. XII - Tongue protrusion intact.  Motor Strength -  The patient's strength was left UE 0/5 and LLE 3-/5, RUE and RLE 5/5  and pronator drift was absent.  Bulk was normal and fasciculations were absent.   Motor Tone - Muscle tone was assessed at the neck and appendages and was normal.  Reflexes - The patient's reflexes were 1+ in all extremities and he had no pathological reflexes.  Sensory - Light touch, temperature/pinprick were assessed and were symmetrical.    Coordination - The patient had normal movements in the hands with no ataxia or dysmetria.  Tremor was absent.  Gait and Station -  Not tested due to safety concerns.   ASSESSMENT/PLAN Mr. Xane Maruska Milliman is a 65 y.o. male with history of HTN, HLD, pre-DM, previous stroke, MI s/p stent, current smoker presenting with slurry speech and gait imbalance. He did not receive IV t-PA due to out of time window.   Stroke:  right BG/CR punctate infarcts, likely due to intracranial atherosclerosis and right ICA stenosis. Repeat MRI showed more prominent right BG/CR infarct without hypotension.   Resultant  Worsening dysarthria and left hemiparesis  MRI  Right BG/CR punctate infarcts  Repeat MRI showed more prominent right BG/CR infarction  MRA  Diffuse intracranial athero, right M2, BA, b/l PCA  Carotid Doppler  40-59% right ICA  CTA neck R ICA 50% with  soft plaque, L ICA 40%, mild B VA stenosis  2D Echo  EF 55-60%  LE venous doppler negative for DVT  LDL 83  HgbA1c 6.1  lovenox subq for VTE prophylaxis Diet NPO time specified  aspirin 81 mg daily and clopidogrel 75 mg daily prior to admission, now on aspirin 325 mg daily and clopidogrel 75 mg daily. Continue DAPT on discharge for both cardiac and stroke prevention.  Patient counseled to be compliant with his antithrombotic medications  Ongoing aggressive stroke risk factor management  Therapy recommendations:  No PT  Disposition:  Pending  Right ICA stenosis  Carotid Doppler  40-59% right ICA  CTA neck R ICA  50% with soft plaque, L ICA 40%, mild B VA stenosis  Vascular surgery on board and recommended CEA.  Pt needs outpt follow up with [facility] for right CEA  History of stroke  11/2011 - left CR - MRA diffuse athero - CUS and TTE negative - on ASA  01/2015 - right semi ovale punctate infarcts - MRA diffuse athero - CUS and TTE negative - LDL 180 and A1C 6.3 - ASA + plavix + zocor  [facility]  Dysphagia / aspiration pneumonia  Failed swallow  On tube feeding  CXR concerning for b/l aspiration penumonia - repeat today better  Put on unasyn  Speech on board  Hypertension  Stable on the high side, no hypotension episode  Goal 130-160 due to right ICA stenosis  Hyperlipidemia  Home meds:  lipitor 60mg   LDL 83, goal < 70  Resume lipitor 40mg  daily  Continue statin at discharge  Tobacco abuse  Current smoker  Smoking cessation counseling provided  Pt is willing to quit  Other Stroke Risk Factors  Advanced age   Hx stroke/TIA  Coronary artery disease s/p stent  Other Active Problems  Proteinuria  Hospital day # 4   Rosalin Hawking, MD PhD Stroke Neurology 10/27/2015 10:26 PM    To contact Stroke Continuity provider, please refer to [email or web address] After hours, contact General Neurology

## 2015-10-28 ENCOUNTER — Inpatient Hospital Stay (HOSPITAL_COMMUNITY): Payer: Medicare Other

## 2015-10-28 ENCOUNTER — Encounter (HOSPITAL_COMMUNITY): Payer: Self-pay | Admitting: General Surgery

## 2015-10-28 LAB — GLUCOSE, CAPILLARY
GLUCOSE-CAPILLARY: 106 mg/dL — AB (ref 65–99)
GLUCOSE-CAPILLARY: 111 mg/dL — AB (ref 65–99)
GLUCOSE-CAPILLARY: 111 mg/dL — AB (ref 65–99)
GLUCOSE-CAPILLARY: 122 mg/dL — AB (ref 65–99)
GLUCOSE-CAPILLARY: 91 mg/dL (ref 65–99)
Glucose-Capillary: 123 mg/dL — ABNORMAL HIGH (ref 65–99)
Glucose-Capillary: 91 mg/dL (ref 65–99)

## 2015-10-28 LAB — BASIC METABOLIC PANEL
Anion gap: 7 (ref 5–15)
BUN: 16 mg/dL (ref 6–20)
CALCIUM: 9.1 mg/dL (ref 8.9–10.3)
CO2: 24 mmol/L (ref 22–32)
CREATININE: 1.2 mg/dL (ref 0.61–1.24)
Chloride: 111 mmol/L (ref 101–111)
GFR calc Af Amer: >60 mL/min (ref >60)
GFR calc non Af Amer: >60 mL/min (ref >60)
GLUCOSE: 102 mg/dL — AB (ref 65–99)
Potassium: 3.8 mmol/L (ref 3.5–5.1)
Sodium: 142 mmol/L (ref 135–145)

## 2015-10-28 LAB — CBC
HEMATOCRIT: 37 % — AB (ref 39.0–52.0)
Hemoglobin: 11.9 g/dL — ABNORMAL LOW (ref 13.0–17.0)
MCH: 26.7 pg (ref 26.0–34.0)
MCHC: 32.2 g/dL (ref 30.0–36.0)
MCV: 83.1 fL (ref 78.0–100.0)
Platelets: 224 10*3/uL (ref 150–400)
RBC: 4.45 MIL/uL (ref 4.22–5.81)
RDW: 15.1 % (ref 11.5–15.5)
WBC: 10.5 10*3/uL (ref 4.0–10.5)

## 2015-10-28 MED ORDER — CLOPIDOGREL BISULFATE 75 MG PO TABS
75.0000 mg | ORAL_TABLET | Freq: Every day | ORAL | Status: DC
  Filled 2015-10-28: qty 1

## 2015-10-28 MED ORDER — SODIUM CHLORIDE 0.9 % IV SOLN: INTRAVENOUS | Status: DC

## 2015-10-28 NOTE — Progress Notes (Signed)
Physical Therapy Treatment Patient Details Name: Sean Collins MRN: [id number] DOB: 02-25-1951 Today's Date: 10/28/2015    History of Present Illness Pt is a 65 y.o. M who presented with dysarthria. MRI revealed Acute RIGHT MCA territory lenticulostriate infarct.  Pt w/ increased weakness Lt UE/LE noted on 6/19 and MRI on 6/19 noted enlargement of Rt posterior corona radiata/posterior Rt lenticular nucleus nonhemorrhagic infarct.  Pt's PMH includes CVA in September 2016 where MRI showed multiple acute tiny right posterior frontal lobe infarcts in the MCA distribution, multiple CVAs, Lt hip fx, HTN, HLD, diet controlled T2DM, and tobacco use.    PT Comments    Patient eager to get OOB today and asking for coffee. Continues to require assist of 2 for standing and for SPT to chair but better able to initiate transfer with RUE and push through RLE. Instructed pt in exercises. Still with dysarthria- does well communicating with writing. Will follow.   Follow Up Recommendations  CIR     Equipment Recommendations  Other (comment)    Recommendations for Other Services       Precautions / Restrictions Precautions Precautions: Fall Restrictions Weight Bearing Restrictions: No    Mobility  Bed Mobility Overal bed mobility: Needs Assistance Bed Mobility: Supine to Sit     Supine to sit: Max assist;HOB elevated     General bed mobility comments: assist with trunk and LEs when coming to sitting position. No initiation or movement of LUE/LE.  Transfers Overall transfer level: Needs assistance Equipment used: 2 person hand held assist Transfers: Stand Pivot Transfers   Stand pivot transfers: Mod assist;+2 physical assistance       General transfer comment: Assist of 2 to perform SPT to chair with no active movement/pivoting of LLE. Better able to stand with use of RLE today and reaching with RUE to the chair.  Ambulation/Gait                 Stairs             Wheelchair Mobility    Modified Rankin (Stroke Patients Only) Modified Rankin (Stroke Patients Only) Pre-Morbid Rankin Score: No significant disability Modified Rankin: Severe disability     Balance Overall balance assessment: Needs assistance Sitting-balance support: Feet supported;No upper extremity supported Sitting balance-Leahy Scale: Zero Sitting balance - Comments: Variable assist sitting EOB- close Min guard-Mod A with left lateral lean. Cues for leaning anteriorly and for upright.  Postural control: Left lateral lean Standing balance support: During functional activity Standing balance-Leahy Scale: Zero                      Cognition Arousal/Alertness: Awake/alert Behavior During Therapy: Flat affect Overall Cognitive Status: Difficult to assess                      Exercises General Exercises - Lower Extremity Long Arc Quad: Right;20 reps;Seated Hip Flexion/Marching: Right;20 reps;Seated    General Comments        Pertinent Vitals/Pain Pain Assessment: No/denies pain Faces Pain Scale: No hurt    Home Living                      Prior Function            PT Goals (current goals can now be found in the care plan section) Progress towards PT goals: Progressing toward goals (slowly)    Frequency  Min 4X/week    PT Plan Current  plan remains appropriate    Co-evaluation             End of Session Equipment Utilized During Treatment: Gait belt Activity Tolerance: Patient tolerated treatment well Patient left: in chair;with call bell/phone within reach;with chair alarm set     Time: 1040-1100 PT Time Calculation (min) (ACUTE ONLY): 20 min  Charges:  $Therapeutic Activity: 8-22 mins                    G Codes:      Sean Collins 10/28/2015, 11:59 AM Wray Kearns, [place], DPT [phone]

## 2015-10-28 NOTE — Progress Notes (Addendum)
Speech Language Pathology Treatment: Dysphagia, motor speech Patient Details Name: Sean Collins MRN: [id number] DOB: 1951-02-19 Today's Date: 10/28/2015 Time: [id number] SLP Time Calculation (min) (ACUTE ONLY): 23 min  Assessment / Plan / Recommendation Clinical Impression  Dysphagia treatment provided for trials of ice chips. Provided oral care with suction. Pt continues to present with significant dysarthria; speech is unchanged from previous date due to very limited tongue and jaw movement. Provided 2 trials of small ice chips. Pt able to initiate a pharyngeal swallow x1 but with resultant wet vocal quality. Pt does have a strong cough when cued. Pt was coughing upon entering room. RN reported that a Dr recently provided pt with large amounts of ice chips resulting in long sustained coughing. Please note that pt is at high risk of aspiration with any consistencies including ice chips. Recommend continuing strict NPO status with meds via alternative means.   Speech treatment also provided today. Pt attempted to use verbalizations combined with gestures to communicate. Encouraged pt to use written modality. Attempted simple exercises- jaw opening/ closing, tongue elevation/ movement from left to right. Pt was unable to initiate tongue movement for this exercise despite max verbal/ tactile cues; pt aware and somewhat frustrated. Provided further education on location of stroke and resulting deficits. Explained that it may take an extended period of time for movement/ strength to recover. Pt continues to be very limited in consonant sound production. Language skills appear grossly intact based on written language skills and ability to follow commands. Will continue to follow for motor speech to attempt exercises and review compensatory strategies.   HPI HPI: [name] is a 65 year old gentleman who presents with dysarthria. 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 He had associated lightheadedness and gait instability where he would "fade" to the left when trying to walk straight. MRI-Acute RIGHT MCA territory lenticulostriate infarct. Patient with worsening speech and LUE symptoms and a repeat MRI showed more prominent right BG/CR infarct. CTA showed right ICA soft plaque with 50% stenosis. PMHx-CVA in September 2016 where MRI showed multiple acute tiny right posterior frontal lobe infarcts in the MCA distribution, multiple CVAs, Lt hip fx, HTN, HLD, diet controlled T2DM, and tobacco use.      SLP Plan  Continue with current plan of care     Recommendations  Diet recommendations: NPO Medication Administration: Via alternative means             Oral Care Recommendations: Oral care QID;Staff/trained caregiver to provide oral care Follow up Recommendations: Inpatient Rehab;Skilled Nursing facility Plan: Continue with current plan of care     Sean Collins, Sean Collins, Sean Collins, CCC-SLP 10/28/2015, 11:41 AM [phone]

## 2015-10-28 NOTE — Progress Notes (Signed)
Discussed with RN CM and SW this morning. Brother is Next of kin. Pt divorced and lives alone. No family caregivers are available to care for this pt after discharge. Brother is requesting SNF rehab. We will sign off. Please call me with any questions. [phone]

## 2015-10-28 NOTE — Progress Notes (Signed)
Patient seen and examined. Case d/w residents in detail. I agree with findings and plan as documented in Dr. Serita Grit note.  Patient mildly improved today. Will need G tube placement prior to d/c. IR will need patient off plavix for 5 days prior to G tube placement. Will hold plavix for now. Will c/w PT, OT and SLP. Will need outpatient f/u with [facility] in 4-6 weeks for possible CEA.   Will c/w unasyn for aspiration PNA. Maintain NPO. Will c/w NG tube for feeds and meds.

## 2015-10-28 NOTE — Care Management Note (Signed)
Case Management Note  Patient Details  Name: Sean Collins MRN: [id number] Date of Birth: 11-Dec-1950  Subjective/Objective:                    Action/Plan:   Expected Discharge Date:                  Expected Discharge Plan:     In-House Referral:     Discharge planning Services     Post Acute Care Choice:    Choice offered to:     DME Arranged:    DME Agency:     HH Arranged:    [place] Agency:     Status of Service:  In process, will continue to follow  If discussed at Long Length of Stay Meetings, dates discussed: 10/28/2015   Additional Comments:  Delrae Sawyers, RN 10/28/2015, 9:09 AM

## 2015-10-28 NOTE — Progress Notes (Signed)
Patient just pulled his NG tube out. Will reinsert a new one.

## 2015-10-28 NOTE — Progress Notes (Signed)
Writer called x-ray department to notify that patient is back to bed and ready for Abdominal x-ray to verify tube placement.

## 2015-10-28 NOTE — Progress Notes (Signed)
Patient's NG tube came out, MD paged to notify. Still awaiting call back.

## 2015-10-28 NOTE — Progress Notes (Signed)
STROKE TEAM PROGRESS NOTE   SUBJECTIVE (INTERVAL HISTORY) Pt still has dysarthria, dysphagia and on TF. Still has copious secretions. Plan for PEG next Monday and SNF placement. Cre and WBC normalized.    OBJECTIVE Temp:  [98 F (36.7 C)-98.7 F (37.1 C)] 98.2 F (36.8 C) (06/22 2133) Pulse Rate:  [70-88] 86 (06/22 2133) Cardiac Rhythm:  [-] Normal sinus rhythm;Bundle branch block (06/22 1900) Resp:  [20] 20 (06/22 2133) BP: (138-166)/(70-87) 146/75 mmHg (06/22 2133) SpO2:  [96 %-100 %] 100 % (06/22 2133) Weight:  [173 lb 8 oz (78.699 kg)] 173 lb 8 oz (78.699 kg) (06/22 0500)  CBC:   Recent Labs Lab 10/23/15 1442  10/27/15 0602 10/28/15 0547  WBC 7.3  < > 12.0* 10.5  NEUTROABS 4.0  --   --   --   HGB 12.2*  < > 12.8* 11.9*  HCT 37.5*  < > 39.7 37.0*  MCV 82.6  < > 83.8 83.1  PLT 217  < > 232 224  < > = values in this interval not displayed.  Basic Metabolic Panel:   Recent Labs Lab 10/27/15 0602 10/28/15 0547  NA 141 142  K 3.8 3.8  CL 111 111  CO2 21* 24  GLUCOSE 96 102*  BUN 18 16  CREATININE 1.17 1.20  CALCIUM 9.2 9.1    Lipid Panel:     Component Value Date/Time   CHOL 138 10/24/2015 0502   TRIG 59 10/24/2015 0502   HDL 43 10/24/2015 0502   CHOLHDL 3.2 10/24/2015 0502   VLDL 12 10/24/2015 0502   LDLCALC 83 10/24/2015 0502   HgbA1c:  Lab Results  Component Value Date   HGBA1C 6.1* 10/23/2015   Urine Drug Screen:     Component Value Date/Time   LABOPIA NONE DETECTED 10/23/2015 1530   COCAINSCRNUR NONE DETECTED 10/23/2015 1530   LABBENZ NONE DETECTED 10/23/2015 1530   AMPHETMU NONE DETECTED 10/23/2015 1530   THCU NONE DETECTED 10/23/2015 1530   LABBARB NONE DETECTED 10/23/2015 1530      IMAGING I have personally reviewed the radiological images below and agree with the radiology interpretations.  Ct Head Wo Contrast 10/23/2015   No acute intracranial process. Chronic microvascular ischemic changes and cortical atrophy.   Mri & Mra  Head/brain Wo Cm 10/23/2015   Acute RIGHT MCA territory lenticulostriate infarct, nonhemorrhagic. Atrophy and small vessel disease. Scattered foci of chronic hemorrhagic and nonhemorrhagic lacunar infarcts. Incompletely evaluated LEFT ethmoid air cell opacity, possible developing mucocele.  No proximal large vessel occlusion or flow-limiting stenosis. Intracranial atherosclerotic change most notable in the LEFT A1 ACA and LEFT P2 PCA, as well as BILATERAL MCA M2 and M3 vessels.  CTA neck 50% diameter stenosis at the origin of the right internal carotid artery. 15 mm distally there is another 50% diameter stenosis due to noncalcified atherosclerotic plaque. 40% diameter stenosis proximal left internal carotid artery. Mild stenosis at the proximal vertebral artery bilaterally. Both vertebral arteries patent.  CUS - Right: 40-59% ICA stenosis (mid to distal),highest end of scale, however, higher velocities may be obscured as patient has high bifurcation and significant plaquing. Left: 1-39% ICA stenosis. Unable to adequately visualize the distal ICA secondary to high bifurcation. Bilateral vertebral artery flow is antegrade.   LE venous doppler - There is no DVT or SVT noted in the bilateral lower extremities.   TTE  - Left ventricle: The cavity size was normal. Wall thickness was normal. Systolic function was normal. The estimated ejection fraction was in  the range of 55% to 60%. Wall motion was normal; there were no regional wall motion abnormalities. Left ventricular diastolic function parameters were normal. - Mitral valve: Calcified annulus. Mildly thickened leaflets . - Left atrium: The atrium was mildly dilated. - Right atrium: The atrium was mildly dilated. - Atrial septum: No defect or patent foramen ovale was identified. - Tricuspid valve: There was moderate regurgitation. - Pulmonary arteries: PA peak pressure: 55 mm Hg (S). - Pericardium, extracardiac: A trivial pericardial effusion  was identified.  Ct Angio Neck W Or Wo Contrast 10/24/2015  IMPRESSION: 50% diameter stenosis at the origin of the right internal carotid artery. 15 mm distally there is another 50% diameter stenosis due to noncalcified atherosclerotic plaque. 40% diameter stenosis proximal left internal carotid artery. Mild stenosis at the proximal vertebral artery bilaterally. Both vertebral arteries patent.    Mr Brain Wo Contrast 10/25/2015  IMPRESSION: Interval enlargement of right posterior corona radiata/ posterior right lenticular nucleus nonhemorrhagic infarct. Remote left frontal lobe infarct with encephalomalacia. Remote corona radiata and basal ganglia infarcts bilaterally. Moderate global atrophy without hydrocephalus. Complex persistent opacification mid to posterior left ethmoid sinus air cells. Mild mucosal thickening remainder of ethmoid sinus air cells. Minimal to mild mucosal thickening frontal sinuses. Minimal maxillary sinus mucosal thickening.    PHYSICAL EXAM General - Well nourished, well developed, in no apparent distress.  Ophthalmologic - Fundi not visualized due to noncooperation.  Cardiovascular - Regular rate and rhythm.  Mental Status -  Level of arousal and orientation to time, place, and person were intact. Language including expression, naming, repetition, comprehension was assessed and found intact, moderate to severe dysarthria. Fund of Knowledge was assessed and was intact.  Cranial Nerves II - XII - II - Visual field intact OU. III, IV, VI - Extraocular movements intact. V - Facial sensation intact bilaterally. VII - left nasolabial fold flattening. VIII - Hearing & vestibular intact bilaterally. X - Palate elevates symmetrically, severe dysarthria. XI - Chin turning & shoulder shrug intact bilaterally. XII - Tongue protrusion intact.  Motor Strength - The patient's strength was left UE 0/5 and LLE 1/5, RUE and RLE 5/5  and pronator drift was absent.  Bulk was normal  and fasciculations were absent.   Motor Tone - Muscle tone was assessed at the neck and appendages and was normal.  Reflexes - The patient's reflexes were 1+ in all extremities and he had no pathological reflexes.  Sensory - Light touch, temperature/pinprick were assessed and were symmetrical.    Coordination - The patient had normal movements in the left hand with no ataxia or dysmetria.  Tremor was absent.  Gait and Station -  Not tested due to weakness.   ASSESSMENT/PLAN [name] is a 65 y.o. male with history of HTN, HLD, pre-DM, previous stroke, MI s/p stent, current smoker presenting with slurry speech and gait imbalance. He did not receive IV t-PA due to out of time window.   Stroke:  right BG/CR punctate infarcts, likely due to intracranial atherosclerosis and right ICA stenosis. Repeat MRI showed more prominent right BG/CR infarct without hypotension.   Resultant  Worsening dysarthria and left hemiplegia  Initial MRI  Right BG/CR punctate infarcts  Repeat MRI showed more prominent right BG/CR infarction  MRA  Diffuse intracranial athero, right M2, BA, b/l PCA  Carotid Doppler  40-59% right ICA  CTA neck R ICA 50% with soft plaque, L ICA 40%, mild B VA stenosis  2D Echo  EF 55-60%  LE venous doppler negative for DVT  LDL 83  HgbA1c 6.1  lovenox subq for VTE prophylaxis Diet NPO time specified  aspirin 81 mg daily and clopidogrel 75 mg daily prior to admission, now on aspirin 325 mg daily and clopidogrel 75 mg daily. Continue DAPT on discharge for both cardiac and stroke prevention. OK to hold off plavix for 5 days prior to PEG placement and resumed after procedure. Continue ASA during the interval time.  Patient counseled to be compliant with his antithrombotic medications  Ongoing aggressive stroke risk factor management  Therapy recommendations:  CIR vs SNF  Disposition:  Pending  Right ICA stenosis  Carotid Doppler  40-59% right  ICA  CTA neck R ICA 50% with soft plaque, L ICA 40%, mild B VA stenosis  Vascular surgery on board and recommended CEA.  Pt needs outpt follow up with [facility] for right CEA  History of stroke  11/2011 - left CR - MRA diffuse athero - CUS and TTE negative - on ASA  01/2015 - right semi ovale punctate infarcts - MRA diffuse athero - CUS and TTE negative - LDL 180 and A1C 6.3 - ASA + plavix + zocor  [facility]  Dysphagia / aspiration pneumonia  Failed swallow  On tube feeding  CXR concerning for b/l aspiration penumonia - improving  On unasyn  Plan for PEG monday  Hypertension  Stable on the high side, no hypotension episode  Goal 130-160 due to right ICA stenosis  Hyperlipidemia  Home meds:  lipitor 60mg   LDL 83, goal < 70  Resume lipitor 40mg  daily  Continue statin at discharge  Tobacco abuse  Current smoker  Smoking cessation counseling provided  Pt is willing to quit  Other Stroke Risk Factors  Advanced age   Hx stroke/TIA  Coronary artery disease s/p stent  Other Active Problems  Proteinuria  Hospital day # 5  Neurology will sign off. Please call with questions. Pt will follow up with Dr. Erlinda Hong at [facility] in about 2 months. Thanks for the consult.   Rosalin Hawking, MD PhD Stroke Neurology 10/28/2015 9:56 PM    To contact Stroke Continuity provider, please refer to [email or web address] After hours, contact General Neurology

## 2015-10-28 NOTE — Consult Note (Signed)
Chief Complaint: dysphagia  Referring [place] Physician: Marybelle Killings  Patient Status: In-pt  HPI: Sean Collins is an 65 y.o. male who was admitted with a CVA.  This is his seventh CVA according to the patient.  He has left sided hemiparesis from this stroke.  He has also been found to have carotid artery stenosis and needs CEA, but is not a candidate currently.  He also has severe dysphagia secondary to this CVA.  We have been asked to evaluate him for placement of a gastrostomy tube.  He is on plavix, but this has been okayed to hold for 5 days prior to this procedure.  Past Medical History:  Past Medical History  Diagnosis Date  . Hypertension   . Hyperlipidemia   . Stroke ([place])   . Diabetes mellitus without complication [facility])     Past Surgical History: History reviewed. No pertinent past surgical history.  Family History:  Family History  Problem Relation Age of Onset  . Diabetes Brother     Social History:  reports that he has been smoking Cigarettes.  He has a 16 pack-year smoking history. He does not have any smokeless tobacco history on file. He reports that he does not drink alcohol or use illicit drugs.  Allergies: No Known Allergies  Medications:   Medication List    TAKE these medications        allopurinol 300 MG tablet  Commonly known as:  ZYLOPRIM  Take 300 mg by mouth daily.     amLODipine 10 MG tablet  Commonly known as:  NORVASC  Take 1 tablet (10 mg total) by mouth daily.     aspirin 325 MG EC tablet  Take 1 tablet (325 mg total) by mouth daily.     atenolol 50 MG tablet  Commonly known as:  TENORMIN  Take 1 tablet (50 mg total) by mouth 2 (two) times daily.     atorvastatin 40 MG tablet  Commonly known as:  LIPITOR  Take 1 tablet (40 mg total) by mouth daily at 6 PM.     carboxymethylcellulose 0.5 % Soln  Commonly known as:  REFRESH PLUS  Place 1 drop into both eyes 3 (three) times daily as  needed (dry eyes).     cholecalciferol 1000 units tablet  Commonly known as:  VITAMIN D  Take 1,000 Units by mouth daily.     clopidogrel 75 MG tablet  Commonly known as:  PLAVIX  Take 1 tablet (75 mg total) by mouth daily.     enalapril 20 MG tablet  Commonly known as:  VASOTEC  Take 1 tablet (20 mg total) by mouth daily.     folic acid 1 MG tablet  Commonly known as:  FOLVITE  Take 1 mg by mouth daily.     omeprazole 20 MG capsule  Commonly known as:  PRILOSEC  Take 20 mg by mouth daily.        ROS: difficult to obtain secondary to severe dysarthria   Mallampati Score: MD Evaluation Airway: WNL Heart: WNL Abdomen: WNL Chest/ Lungs: WNL ASA  Classification: 3  Physical Exam: BP 159/70 mmHg  Pulse 77  Temp(Src) 98.4 F (36.9 C) (Axillary)  Resp 20  Ht 5' 9"  (1.753 m)  Wt 173 lb 8 oz (78.699 kg)  BMI 25.61 kg/m2  SpO2 100% Body mass index is 25.61 kg/(m^2). General: pleasant, WD, WN black male who is laying in bed in NAD HEENT: head is normocephalic,  atraumatic.  Sclera are noninjected.  PERRL.  Ears and nose without any masses or lesions.  Left-sided facial droop. Heart: regular, rate, and rhythm.  Normal s1,s2. No obvious murmurs, gallops, or rubs noted.  Palpable radial and pedal pulses bilaterally Lungs: CTAB, no wheezes, rhonchi, or rales noted.  Respiratory effort nonlabored Abd: soft, NT, ND, +BS, no masses, hernias, or organomegaly MS: all 4 extremities are symmetrical with no cyanosis, clubbing, or edema. However, he is left-sided hemiparetic.  He is unable to move his LUE, but can minimally move his LLE Psych: A&Ox3 with a flat affect.   Labs: Results for orders placed or performed during the hospital encounter of 10/23/15 (from the past 48 hour(s))  Glucose, capillary     Status: None   Collection Time: 10/26/15  4:52 PM  Result Value Ref Range   Glucose-Capillary 76 65 - 99 mg/dL   Comment 1 Notify RN    Comment 2 Document in Chart   Glucose,  capillary     Status: Abnormal   Collection Time: 10/26/15  7:52 PM  Result Value Ref Range   Glucose-Capillary 125 (H) 65 - 99 mg/dL   Comment 1 Notify RN    Comment 2 Document in Chart   Glucose, capillary     Status: None   Collection Time: 10/26/15 11:46 PM  Result Value Ref Range   Glucose-Capillary 91 65 - 99 mg/dL   Comment 1 Notify RN    Comment 2 Document in Chart   Urinalysis, Routine w reflex microscopic (not at [facility])     Status: Abnormal   Collection Time: 10/27/15  1:15 AM  Result Value Ref Range   Color, Urine YELLOW YELLOW   APPearance HAZY (A) CLEAR   Specific Gravity, Urine 1.026 1.005 - 1.030   pH 6.0 5.0 - 8.0   Glucose, UA NEGATIVE NEGATIVE mg/dL   Hgb urine dipstick TRACE (A) NEGATIVE   Bilirubin Urine SMALL (A) NEGATIVE   Ketones, ur 15 (A) NEGATIVE mg/dL   Protein, ur >300 (A) NEGATIVE mg/dL   Nitrite NEGATIVE NEGATIVE   Leukocytes, UA NEGATIVE NEGATIVE  Urine microscopic-add on     Status: Abnormal   Collection Time: 10/27/15  1:15 AM  Result Value Ref Range   Squamous Epithelial / LPF 0-5 (A) NONE SEEN   WBC, UA 0-5 0 - 5 WBC/hpf   RBC / HPF 0-5 0 - 5 RBC/hpf   Bacteria, UA FEW (A) NONE SEEN   Casts HYALINE CASTS (A) NEGATIVE   Urine-Other MUCOUS PRESENT   Glucose, capillary     Status: Abnormal   Collection Time: 10/27/15  3:44 AM  Result Value Ref Range   Glucose-Capillary 101 (H) 65 - 99 mg/dL   Comment 1 Notify RN    Comment 2 Document in Chart   CBC     Status: Abnormal   Collection Time: 10/27/15  6:02 AM  Result Value Ref Range   WBC 12.0 (H) 4.0 - 10.5 K/uL   RBC 4.74 4.22 - 5.81 MIL/uL   Hemoglobin 12.8 (L) 13.0 - 17.0 g/dL   HCT 39.7 39.0 - 52.0 %   MCV 83.8 78.0 - 100.0 fL   MCH 27.0 26.0 - 34.0 pg   MCHC 32.2 30.0 - 36.0 g/dL   RDW 15.5 11.5 - 15.5 %   Platelets 232 150 - 400 K/uL  Basic metabolic panel     Status: Abnormal   Collection Time: 10/27/15  6:02 AM  Result Value Ref Range  Sodium 141 135 - 145 mmol/L    Potassium 3.8 3.5 - 5.1 mmol/L   Chloride 111 101 - 111 mmol/L   CO2 21 (L) 22 - 32 mmol/L   Glucose, Bld 96 65 - 99 mg/dL   BUN 18 6 - 20 mg/dL   Creatinine, Ser 1.17 0.61 - 1.24 mg/dL   Calcium 9.2 8.9 - 10.3 mg/dL   GFR calc non Af Amer >60 >60 mL/min   GFR calc Af Amer >60 >60 mL/min    Comment: (NOTE) The eGFR has been calculated using the CKD EPI equation. This calculation has not been validated in all clinical situations. eGFR's persistently <60 mL/min signify possible Chronic Kidney Disease.    Anion gap 9 5 - 15  Glucose, capillary     Status: None   Collection Time: 10/27/15  7:20 AM  Result Value Ref Range   Glucose-Capillary 99 65 - 99 mg/dL  Glucose, capillary     Status: None   Collection Time: 10/27/15 11:49 AM  Result Value Ref Range   Glucose-Capillary 88 65 - 99 mg/dL  Glucose, capillary     Status: None   Collection Time: 10/27/15  4:34 PM  Result Value Ref Range   Glucose-Capillary 90 65 - 99 mg/dL  Glucose, capillary     Status: Abnormal   Collection Time: 10/27/15  8:06 PM  Result Value Ref Range   Glucose-Capillary 113 (H) 65 - 99 mg/dL   Comment 1 Notify RN    Comment 2 Document in Chart   Glucose, capillary     Status: Abnormal   Collection Time: 10/27/15 11:57 PM  Result Value Ref Range   Glucose-Capillary 106 (H) 65 - 99 mg/dL   Comment 1 Notify RN    Comment 2 Document in Chart   Glucose, capillary     Status: Abnormal   Collection Time: 10/28/15  5:39 AM  Result Value Ref Range   Glucose-Capillary 111 (H) 65 - 99 mg/dL   Comment 1 Notify RN    Comment 2 Document in Chart   CBC     Status: Abnormal   Collection Time: 10/28/15  5:47 AM  Result Value Ref Range   WBC 10.5 4.0 - 10.5 K/uL   RBC 4.45 4.22 - 5.81 MIL/uL   Hemoglobin 11.9 (L) 13.0 - 17.0 g/dL   HCT 37.0 (L) 39.0 - 52.0 %   MCV 83.1 78.0 - 100.0 fL   MCH 26.7 26.0 - 34.0 pg   MCHC 32.2 30.0 - 36.0 g/dL   RDW 15.1 11.5 - 15.5 %   Platelets 224 150 - 400 K/uL  Basic  metabolic panel     Status: Abnormal   Collection Time: 10/28/15  5:47 AM  Result Value Ref Range   Sodium 142 135 - 145 mmol/L   Potassium 3.8 3.5 - 5.1 mmol/L   Chloride 111 101 - 111 mmol/L   CO2 24 22 - 32 mmol/L   Glucose, Bld 102 (H) 65 - 99 mg/dL   BUN 16 6 - 20 mg/dL   Creatinine, Ser 1.20 0.61 - 1.24 mg/dL   Calcium 9.1 8.9 - 10.3 mg/dL   GFR calc non Af Amer >60 >60 mL/min   GFR calc Af Amer >60 >60 mL/min    Comment: (NOTE) The eGFR has been calculated using the CKD EPI equation. This calculation has not been validated in all clinical situations. eGFR's persistently <60 mL/min signify possible Chronic Kidney Disease.    Anion gap 7 5 -  15  Glucose, capillary     Status: Abnormal   Collection Time: 10/28/15  8:15 AM  Result Value Ref Range   Glucose-Capillary 123 (H) 65 - 99 mg/dL  Glucose, capillary     Status: Abnormal   Collection Time: 10/28/15 11:51 AM  Result Value Ref Range   Glucose-Capillary 111 (H) 65 - 99 mg/dL    Imaging: Ct Abdomen Wo Contrast  10/27/2015  CLINICAL DATA:  Evaluate for gastrostomy tube EXAM: CT ABDOMEN WITHOUT CONTRAST TECHNIQUE: Multidetector CT imaging of the abdomen was performed following the standard protocol without IV contrast. COMPARISON:  None. FINDINGS: Lower chest: There is bibasilar linear nodular thickening medially suggesting aspiration pneumonitis Hepatobiliary: No focal hepatic lesion on noncontrast exam. Normal gallbladder Pancreas: Pancreas is normal. No ductal dilatation. No pancreatic inflammation. Spleen: Normal spleen Adrenals/urinary tract: Adrenal glands and kidneys are normal. The ureters and bladder are normal. Stomach/Bowel: There is a feeding tube within the gastric body and fundus. The stomach is nondistended and in typical orientation. Limited view of the bowel is unremarkable. Vascular/Lymphatic: Abdominal or is normal caliber. Infrarenal IVC filter noted. Intimal calcification aorta. Other: No free fluid.  Musculoskeletal: No aggressive osseous lesion. IMPRESSION: 1. Feeding tube extend into the gastric fundus. Stomach in typical orientation without obstruction. 2. Bibasilar linear nodular thickening in the medial lung bases is suggestive of aspiration pneumonitis. 3. Atherosclerotic calcification aorta. Electronically Signed   By: Suzy Bouchard M.D.   On: 10/27/2015 16:49   Dg Chest Port 1 View  10/27/2015  CLINICAL DATA:  Cough. EXAM: PORTABLE CHEST 1 VIEW COMPARISON:  Radiograph of October 26, 2015. FINDINGS: The heart size and mediastinal contours are within normal limits. No pneumothorax is noted. Mild bibasilar subsegmental atelectasis is noted. No significant pleural effusion is noted. Distal tip of feeding tube is seen in proximal stomach. Atherosclerosis of thoracic aorta is noted. The visualized skeletal structures are unremarkable. IMPRESSION: Mild bibasilar subsegmental atelectasis.  Aortic atherosclerosis. Electronically Signed   By: Marijo Conception, M.D.   On: 10/27/2015 12:23   Dg Abd Portable 1v  10/28/2015  CLINICAL DATA:  Patient status post feeding tube placement. EXAM: PORTABLE ABDOMEN - 1 VIEW COMPARISON:  CT abdomen pelvis 10/27/2015. FINDINGS: Weighted feeding tube tip projects over the fourth portion of the duodenum, within the left upper quadrant. IVC filter. Nonobstructed bowel gas pattern. Left hip arthroplasty. Lumbar spine degenerative changes. IMPRESSION: Weighted feeding tube tip projects over the left upper quadrant, likely within the fourth portion of the duodenum. Electronically Signed   By: Lovey Newcomer M.D.   On: 10/28/2015 14:19   Dg Abd Portable 1v  10/26/2015  CLINICAL DATA:  Encounter for feeding tube placement; EXAM: PORTABLE ABDOMEN - 1 VIEW COMPARISON:  10/26/2015 FINDINGS: Feeding tube extends into the first portion duodenum/gastric antrum. Bibasilar lung opacities. IMPRESSION: Feeding tube in gastric antrum/ first portion duodenum. Electronically Signed   By:  Suzy Bouchard M.D.   On: 10/26/2015 16:45    Assessment/Plan 1. CVA with severe dysphagia -plavix is on hold.  We will plan g-tube placement on Monday 11-01-15 as of now. -labs have been reviewed -Risks and Benefits discussed with the patient including, but not limited to the need for a barium enema during the procedure, bleeding, infection, peritonitis, or damage to adjacent structures. All of the patient's questions were answered, patient is agreeable to proceed. Consent signed and in chart.   Thank you for this interesting consult.  I greatly enjoyed meeting [facility] and look  forward to participating in their care.  A copy of this report was sent to the requesting provider on this date.  Electronically Signed: Henreitta Cea 10/28/2015, 4:23 PM   I spent a total of 40 Minutes   in face to face in clinical consultation, greater than 50% of which was counseling/coordinating care for dysphagia secondary to CVA

## 2015-10-28 NOTE — Progress Notes (Signed)
Dr. Tiburcio Pea  requested ice for the patient. Writer gave a cup of ice to the MD. Patient is NPO at this time.

## 2015-10-28 NOTE — Progress Notes (Signed)
Called [facility] about replacing patient's NG tube.

## 2015-10-28 NOTE — Progress Notes (Signed)
Subjective: Patient feels that he is a "little better." He is able to write. NG tube is replaced after it was pulled this morning. Inpatient Rehab has signed off as he is not considered a candidate for admission due to no caregivers available at home on discharge. IR has been consulted for PEG tube placement. Per policy, they request patient be off Plavix 5 days prior to procedure. Discussed with Neurology who agree that holding Plavix is okay given the circumstances and to continue Aspirin.  Objective: Vital signs in last 24 hours: Filed Vitals:   10/28/15 0132 10/28/15 0500 10/28/15 0535 10/28/15 1420  BP: 161/74  166/79 159/70  Pulse: 76  70 77  Temp: 98.7 F (37.1 C)  98.6 F (37 C) 98.4 F (36.9 C)  TempSrc: Axillary  Axillary Axillary  Resp: 20  20 20   Height:      Weight:  173 lb 8 oz (78.699 kg)    SpO2: 96%  100% 100%   Weight change: 3 lb 9.6 oz (1.633 kg) No intake or output data in the 24 hours ending 10/28/15 1804 General: resting in bed Cardiac: RRR, no rubs, murmurs or gallops Pulm: clear breath sounds bilaterally , moving normal volumes of air Abd: soft, nontender Ext: warm and well perfused, no pedal edema Neuro: dysarthric with nearly unintelligible speech, strength 0/5 LUE, LLE 1/5, 5/5 right upper and lower extremities, absent tongue protrusion, left facial droop  Assessment/Plan: Principal Problem:   CVA (cerebral infarction) Active Problems:   Hypertension associated with diabetes (HCC)   Dysarthria   Tobacco use disorder   HLD (hyperlipidemia)   Dysphagia due to recent stroke   Aspiration pneumonia of both lower lobes (HCC)   Cerebral infarction due to unspecified mechanism   Left hemiparesis (HCC)   Cough   Dysphagia  Acute Right MCA territory lenticulostriate infarct: Seen on MRI brain 6/17. TTE showed an EF of 55-60%, PA peak pressure 55 mmHg, no comment on thrombus. Preliminary carotid duplex shows Right ICA 40-59% stenosis, Left ICA 1-39%  stenosis. Preliminary LE dopplers without DVT or SVT. CTA neck shows 50% stenosis at origin of Right ICA as well as 15 mm distally. Also shows 40% stenosis at proximal Left ICA. He has known atherosclerosis which is the most likely cause for his recurrent strokes. Repeat MRI on 6/19 showed evolution of stroke, no new infarct. VVS deferring CEA as an outpatient procedure given current neurological status. Patient now has essentially no function in LUE and minimal movement against gravity with LLE and continued severe dysarthria. -Neurology consulted, appreciate assistance and recommendations -Appreciate PT/OT/SLP assistance -CIR has signed off now -Permissive HTN <220/120 -Aspirin 325 mg daily -Hold Plavix 75 mg for 5 days for planned PEG tube placement -f/u with VVS outpatient in 4-6 weeks  Dysphagia and Aspiration Pneumonia: CXR 6/20 concerning for aspiration pneumonia. Started antibiotics given patient's dysphagia, elevated white count, and worsened neurological deficits. Will complete a 7 day course. NG tube in place now for medication and tube feeds. IR consulted for PEG placement prior to discharge. Holding Plavix for 5 days for planned procedure on Monday (6/26).  -IR consulted, appreciate assistance -IV Unasyn per pharmacy (6/20 >> 6/26) -Monitor CBC, BMP -IV NS @ 125 mL/hr -Diet strict NPO, unable to complete MBS due to inability to control secretions -SLP following, appreciate assistance -Continue NG tube, meds per tube -Planned PEG tube placement 6/26 by IR  HTN: Takes Enalapril 20 mg qd, Atenolol 50 mg BID, Amlodipine 10 mg daily -  Hold meds for permissive HTN  HLD:  -Lipitor 40 mg    Dispo:  Deferred at this time, awaiting improvement of current medical problems.  The patient does have a current PCP with the [facility] (Provider Default, MD) and does not need an [facility] hospital follow-up appointment after discharge.  The patient does not have transportation limitations that  hinder transportation to clinic appointments.    LOS: 5 days   Zada Finders, MD 10/28/2015, 6:04 PM

## 2015-10-28 NOTE — Progress Notes (Signed)
Cortrak feeding tube inserted in patients right nare, 100 cm. Patient tolerated well. KUB ordered and feeding tube added to assessment. Nurse at bedside. Please consult cortrak provider with any questions or concerns.

## 2015-10-29 ENCOUNTER — Inpatient Hospital Stay (HOSPITAL_COMMUNITY): Payer: Medicare Other

## 2015-10-29 DIAGNOSIS — E876 Hypokalemia: Secondary | ICD-10-CM

## 2015-10-29 LAB — GLUCOSE, CAPILLARY
GLUCOSE-CAPILLARY: 121 mg/dL — AB (ref 65–99)
GLUCOSE-CAPILLARY: 91 mg/dL (ref 65–99)
GLUCOSE-CAPILLARY: 94 mg/dL (ref 65–99)
GLUCOSE-CAPILLARY: 97 mg/dL (ref 65–99)
Glucose-Capillary: 85 mg/dL (ref 65–99)
Glucose-Capillary: 65 mg/dL (ref 65–99)

## 2015-10-29 LAB — CBC
HEMATOCRIT: 36.1 % — AB (ref 39.0–52.0)
HEMOGLOBIN: 11.7 g/dL — AB (ref 13.0–17.0)
MCH: 26.5 pg (ref 26.0–34.0)
MCHC: 32.4 g/dL (ref 30.0–36.0)
MCV: 81.7 fL (ref 78.0–100.0)
Platelets: 246 10*3/uL (ref 150–400)
RBC: 4.42 MIL/uL (ref 4.22–5.81)
RDW: 14.8 % (ref 11.5–15.5)
WBC: 7.7 10*3/uL (ref 4.0–10.5)

## 2015-10-29 LAB — BASIC METABOLIC PANEL
Anion gap: 9 (ref 5–15)
BUN: 16 mg/dL (ref 6–20)
CHLORIDE: 112 mmol/L — AB (ref 101–111)
CO2: 24 mmol/L (ref 22–32)
Calcium: 8.9 mg/dL (ref 8.9–10.3)
Creatinine, Ser: 1.09 mg/dL (ref 0.61–1.24)
GFR calc Af Amer: >60 mL/min (ref >60)
GFR calc non Af Amer: >60 mL/min (ref >60)
Glucose, Bld: 93 mg/dL (ref 65–99)
POTASSIUM: 3.3 mmol/L — AB (ref 3.5–5.1)
SODIUM: 145 mmol/L (ref 135–145)

## 2015-10-29 MED ORDER — POTASSIUM CHLORIDE IN NACL 40-0.9 MEQ/L-% IV SOLN
INTRAVENOUS | Status: AC
  Administered 2015-10-29: 125 mL/h via INTRAVENOUS
  Filled 2015-10-29 (×2): qty 1000

## 2015-10-29 MED ORDER — DEXTROSE 50 % IV SOLN
INTRAVENOUS | Status: AC
  Administered 2015-10-29: 50 mL
  Filled 2015-10-29: qty 50

## 2015-10-29 NOTE — Progress Notes (Signed)
Physical Therapy Treatment Patient Details Name: Sean Collins MRN: [id number] DOB: 10-22-1950 Today's Date: 10/29/2015    History of Present Illness Pt is a 65 y.o. M who presented with dysarthria. MRI revealed Acute RIGHT MCA territory lenticulostriate infarct.  Pt w/ increased weakness Lt UE/LE noted on 6/19 and MRI on 6/19 noted enlargement of Rt posterior corona radiata/posterior Rt lenticular nucleus nonhemorrhagic infarct.  Pt's PMH includes CVA in September 2016 where MRI showed multiple acute tiny right posterior frontal lobe infarcts in the MCA distribution, multiple CVAs, Lt hip fx, HTN, HLD, diet controlled T2DM, and tobacco use.    PT Comments    Patient continues to be eager to drink/swallow. Assisted with positioning upright and into chair to help with PO trials with SLP. Tolerated dynamic standing at sink to perform ADLs requiring assist for standing balance and blocking left knee. SPT with assist of 2 but pt better able to stand upright today, but still no motor function in LLE during transfer. Discharge recommendation updated to short term SNF as pt denied CIR. Will follow.   Follow Up Recommendations  SNF     Equipment Recommendations  Other (comment) (TBA)    Recommendations for Other Services       Precautions / Restrictions Precautions Precautions: Fall Restrictions Weight Bearing Restrictions: No    Mobility  Bed Mobility Overal bed mobility: Needs Assistance Bed Mobility: Supine to Sit     Supine to sit: Max assist;+2 for physical assistance     General bed mobility comments: assist with trunk and LEs when coming to sitting position. Minimal initiation with LLE. Cues for technique.  Transfers Overall transfer level: Needs assistance Equipment used: 2 person hand held assist Transfers: Sit to/from [facility] Sit to Stand: Mod assist;+2 physical assistance Stand pivot transfers: Mod assist;+2 physical assistance        General transfer comment: Assist of 2 to perform SPT to chair with no active movement/pivoting of LLE. Better able to stand with use of RLE today and reaching with RUE to the chair. Tolerated standing while at sink to perform ADLs with therapist blocking LLE and assisting with upright trunk.  Ambulation/Gait                 Stairs            Wheelchair Mobility    Modified Rankin (Stroke Patients Only) Modified Rankin (Stroke Patients Only) Pre-Morbid Rankin Score: No significant disability Modified Rankin: Severe disability     Balance Overall balance assessment: Needs assistance Sitting-balance support: Feet supported;Single extremity supported Sitting balance-Leahy Scale: Poor Sitting balance - Comments: Variable assist sitting EOB- close Min guard-Mod A with left lateral lean. Cues for leaning anteriorly and for upright. Able to assist with pericare and clean up in upper thigh after incontinence. Postural control: Left lateral lean Standing balance support: During functional activity Standing balance-Leahy Scale: Poor Standing balance comment: Requires assist of 2 to stand but able to maintain dynamic standing balance with assist of 1 with therapist stabilizing LLE and manual cues for upright/positioning. + incontinent of urine upon standing.                     Cognition Arousal/Alertness: Awake/alert Behavior During Therapy: Flat affect Overall Cognitive Status: Difficult to assess                      Exercises      General Comments  Pertinent Vitals/Pain Pain Assessment: Faces Faces Pain Scale: No hurt    Home Living                      Prior Function            PT Goals (current goals can now be found in the care plan section) Progress towards PT goals: Progressing toward goals (slowly)    Frequency  Min 4X/week    PT Plan Discharge plan needs to be updated    Co-evaluation PT/OT/SLP  Co-Evaluation/Treatment: Yes Reason for Co-Treatment: Complexity of the patient's impairments (multi-system involvement);For patient/therapist safety (positioning for PO trials with SLP in beginning of session) PT goals addressed during session: Mobility/safety with mobility;Strengthening/ROM;Balance   SLP goals addressed during session: Swallowing   End of Session Equipment Utilized During Treatment: Gait belt Activity Tolerance: Patient tolerated treatment well Patient left: in chair;with call bell/phone within reach;with chair alarm set     Time: 0935 (20 mins with SLP)-1026 PT Time Calculation (min) (ACUTE ONLY): 51 min  Charges:  $Therapeutic Activity: 8-22 mins                    G Codes:      Rocio Roam A Joseangel Nettleton 10/29/2015, 1:51 PM  Wray Kearns, [place], DPT [phone]

## 2015-10-29 NOTE — Progress Notes (Signed)
Dietician called back and said he will relate the information regarding the NG tube to the appropriate person.

## 2015-10-29 NOTE — Care Management Important Message (Signed)
Important Message  Patient Details  Name: Sean Collins MRN: [id number] Date of Birth: [birth year]   Medicare Important Message Given:  Yes    Loann Quill 10/29/2015, 9:52 AM

## 2015-10-29 NOTE — Care Management Note (Signed)
Case Management Note  Patient Details  Name: Sean Collins MRN: [id number] Date of Birth: Jan 09, 1951  Subjective/Objective:    Pt admitted with CVA                Action/Plan:  Tentative surgery Monday 11/01/15 for PEG placement.  Recommendations were placed for CIR however - deemed not appropriate. Active CSW consult for discharge to SNF when medically stable.  CM will continue to monitor for discharge needs   Expected Discharge Date:                  Expected Discharge Plan:  [place]  In-House Referral:  Clinical Social Work  Discharge planning Services  CM Consult  Post Acute Care Choice:    Choice offered to:     DME Arranged:    DME Agency:     HH Arranged:    [place] Agency:     Status of Service:  In process, will continue to follow  If discussed at Long Length of Stay Meetings, dates discussed:    Additional Comments:  Maryclare Labrador, RN 10/29/2015, 10:54 AM

## 2015-10-29 NOTE — Progress Notes (Signed)
cortrack paged to notify that patient NG tube needed to be replaced. Awaiting a call back

## 2015-10-29 NOTE — Progress Notes (Signed)
Subjective: Patient's NG tube became dislodged overnight after a coughing fit. He continues to be very dysarthric with inability to move LUE. He can raise his LLE slightly off the bed. He did not want to write this morning but agrees to write any questions he has on paper.  Objective: Vital signs in last 24 hours: Filed Vitals:   10/28/15 1840 10/28/15 2133 10/29/15 0128 10/29/15 0543  BP: 138/87 146/75 134/84 139/71  Pulse: 88 86 74 84  Temp: 98 F (36.7 C) 98.2 F (36.8 C) 98.3 F (36.8 C) 98.2 F (36.8 C)  TempSrc: Oral Oral Oral Oral  Resp: 20 20 20 20   Height:      Weight:      SpO2: 100% 100% 100% 100%   Weight change:   Intake/Output Summary (Last 24 hours) at 10/29/15 [id number] Last data filed at 10/29/15 [id number]  Gross per 24 hour  Intake      0 ml  Output    150 ml  Net   -150 ml   General: resting in bed Cardiac: RRR, no rubs, murmurs or gallops Pulm: clear breath sounds bilaterally , moving normal volumes of air Abd: soft, nontender Ext: warm and well perfused, no pedal edema Neuro: dysarthric with nearly unintelligible speech, strength 0/5 LUE, LLE 3/5, 5/5 right upper and lower extremities  Assessment/Plan: Principal Problem:   CVA (cerebral infarction) Active Problems:   Hypertension associated with diabetes (HCC)   Dysarthria   Tobacco use disorder   HLD (hyperlipidemia)   Dysphagia due to recent stroke   Aspiration pneumonia of both lower lobes (HCC)   Cerebral infarction due to unspecified mechanism   Left hemiparesis (HCC)   Cough   Dysphagia  Acute Right MCA territory lenticulostriate infarct: VVS deferring CEA as an outpatient procedure given current neurological status. Patient now has essentially no function in LUE and minimal movement against gravity with LLE and continued severe dysarthria. -Neurology consulted, appreciate assistance and recommendations -Appreciate PT/OT/SLP assistance -CIR has signed off now -Permissive HTN  <220/120 -Aspirin 325 mg daily -Hold Plavix 75 mg for 5 days for planned PEG tube placement -f/u with VVS outpatient in 4-6 weeks  Dysphagia and Aspiration Pneumonia: CXR 6/20 concerning for aspiration pneumonia. Started antibiotics given patient's dysphagia, elevated white count, and worsened neurological deficits. Will complete a 7 day course. IR consulted for PEG placement prior to discharge. Holding Plavix for 5 days for planned procedure on Monday (6/26). Will replace NG tube as tolerated for meds and nutrition over the weekend. -IR consulted, appreciate assistance -IV Unasyn per pharmacy (6/20 >> 6/26) -Monitor CBC, BMP -IV NS @ 125 mL/hr -Diet strict NPO, unable to complete MBS due to inability to control secretions -SLP following, appreciate assistance -Continue NG tube, meds per tube -Planned PEG tube placement 6/26 by IR  Hypokalemia: Potassium 3.3 this morning. Will supplement in IV fluids. -NS @ 125 mL/hr + K 40 mEq for 12 hours  HTN: Takes Enalapril 20 mg qd, Atenolol 50 mg BID, Amlodipine 10 mg daily -Hold meds for permissive HTN  HLD:  -Lipitor 40 mg    Dispo:  Deferred at this time, awaiting improvement of current medical problems. Anticipate discharge to SNF after PEG tube placement.  The patient does have a current PCP with the [facility] (Provider Default, MD) and does not need an [facility] hospital follow-up appointment after discharge.  The patient does not have transportation limitations that hinder transportation to clinic appointments.    LOS: 6 days  Zada Finders, MD 10/29/2015, 9:26 AM

## 2015-10-29 NOTE — Progress Notes (Signed)
Occupational Therapy Treatment Patient Details Name: Sean Collins MRN: [id number] DOB: 08/06/1950 Today's Date: 10/29/2015    History of present illness Pt is a 65 y.o. M who presented with dysarthria. MRI revealed Acute RIGHT MCA territory lenticulostriate infarct.  Pt w/ increased weakness Lt UE/LE noted on 6/19 and MRI on 6/19 noted enlargement of Rt posterior corona radiata/posterior Rt lenticular nucleus nonhemorrhagic infarct.  Pt's PMH includes CVA in September 2016 where MRI showed multiple acute tiny right posterior frontal lobe infarcts in the MCA distribution, multiple CVAs, Lt hip fx, HTN, HLD, diet controlled T2DM, and tobacco use.   OT comments  Pt very participatory in therapy this session. Pt requires mod assist +2 for basic transfers. Pt with incontinent of urine episode during session; pt able to assist with peri care and LB bathing but still requires mod-max assist for LB ADLs. D/c plan updated to SNF for follow up secondary to CIR denial. Will continue to follow pt acutely.   Follow Up Recommendations  SNF;Supervision/Assistance - 24 hour    Equipment Recommendations  Other (comment) (TBD at next venue)    Recommendations for Other Services      Precautions / Restrictions Precautions Precautions: Fall Restrictions Weight Bearing Restrictions: No       Mobility Bed Mobility Overal bed mobility: Needs Assistance Bed Mobility: Supine to Sit     Supine to sit: Max assist;+2 for physical assistance     General bed mobility comments: assist with trunk and LEs when coming to sitting position. Minimal initiation with LLE. Cues for technique.  Transfers Overall transfer level: Needs assistance Equipment used: 2 person hand held assist Transfers: Sit to/from [facility] Sit to Stand: Mod assist;+2 physical assistance Stand pivot transfers: Mod assist;+2 physical assistance       General transfer comment: Assist of 2 to perform SPT to  chair with no active movement/pivoting of LLE. Better able to stand with use of RLE today and reaching with RUE to the chair. Tolerated standing while at sink to perform ADLs with therapist blocking LLE and assisting with upright trunk.    Balance Overall balance assessment: Needs assistance Sitting-balance support: Feet supported;Single extremity supported Sitting balance-Leahy Scale: Poor Sitting balance - Comments: Variable assist sitting EOB- close Min guard-Mod A with left lateral lean. Cues for leaning anteriorly and for upright. Able to assist with pericare and clean up in upper thigh after incontinence. Postural control: Left lateral lean Standing balance support: During functional activity;Single extremity supported Standing balance-Leahy Scale: Poor Standing balance comment: Requires assist of 2 to stand but able to maintain dynamic standing balance with assist of 1 with therapist stabilizing LLE and manual cues for upright/positioning. + incontinent of urine upon standing.                    ADL Overall ADL's : Needs assistance/impaired     Grooming: Set up;Min guard;Sitting;Brushing hair;Wash/dry face Grooming Details (indicate cue type and reason): Observed oral care and self feeding with SLP. Attempted standing grooming activities but pt with urinary incontinence standing at the sink.     Lower Body Bathing: Moderate assistance;Sitting/lateral leans Lower Body Bathing Details (indicate cue type and reason): Pt able to wash peri area and upper legs. Assist provided for lower legs and feet. Upper Body Dressing : Minimal assistance;Sitting   Lower Body Dressing: Maximal assistance Lower Body Dressing Details (indicate cue type and reason): To doff/don socks in sitting   Toilet Transfer Details (indicate cue type and  reason): Max assist to hold urinal. Toileting- Clothing Manipulation and Hygiene: Set up;Min guard Toileting - Clothing Manipulation Details (indicate cue  type and reason): Sitting for peri care     Functional mobility during ADLs: Moderate assistance;+2 for physical assistance (for stand pivot only) General ADL Comments: Pt following commands and very participatory in session. Pt with episode of urinary incontinence; told thearpist he had to go but was too late.      Vision                     Perception     Praxis      Cognition   Behavior During Therapy: Flat affect Overall Cognitive Status: Difficult to assess                       Extremity/Trunk Assessment               Exercises     Shoulder Instructions       General Comments      Pertinent Vitals/ Pain       Pain Assessment: Faces Faces Pain Scale: No hurt  Home Living                                          Prior Functioning/Environment              Frequency Min 2X/week     Progress Toward Goals  OT Goals(current goals can now be found in the care plan section)  Progress towards OT goals: Progressing toward goals  Acute Rehab OT Goals Patient Stated Goal: not stated OT Goal Formulation: With patient  Plan Discharge plan needs to be updated    Co-evaluation    PT/OT/SLP Co-Evaluation/Treatment: Yes Reason for Co-Treatment: Complexity of the patient's impairments (multi-system involvement);For patient/therapist safety;Other (comment) (positioning for PO trials at beginning of session) PT goals addressed during session: Mobility/safety with mobility;Strengthening/ROM;Balance OT goals addressed during session: ADL's and self-care;Other (comment) (functional mobility)      End of Session Equipment Utilized During Treatment: Gait belt   Activity Tolerance Patient tolerated treatment well   Patient Left in chair;with call bell/phone within reach;with chair alarm set   Nurse Communication          Time: 0936-(20 min with SLP for PO trials) 1024 OT Time Calculation (min): 48 min  Charges: OT  General Charges $OT Visit: 1 Procedure OT Treatments $Self Care/Home Management : 8-22 mins  Binnie Kand M.S., OTR/L Pager: [phone]  10/29/2015, 2:04 PM

## 2015-10-29 NOTE — Progress Notes (Signed)
Called cortrak to follow up on when patient's tube will be replaced.

## 2015-10-29 NOTE — Progress Notes (Addendum)
Speech Language Pathology Treatment: Dysphagia  Patient Details Name: Sean Collins MRN: [id number] DOB: [birth year] Today's Date: 10/29/2015 Time: 0950-1010 SLP Time Calculation (min) (ACUTE ONLY): 20 min  Assessment / Plan / Recommendation Clinical Impression  Dysphagia treatment provided today for PO trials. Co-treat with PT for positioning in chair to increase safety for PO trials. Oral care with suction provided. Pt able to trigger a pharyngeal swallow x5 today with trials of ice chips and puree consistencies, much improved from previous date. Pt able to feed self with R hand; appears that placing ice chip/ puree on L side of tongue more effective in triggering a swallow. Pt continues to have significant anterior bolus loss/ oral residuals and required suctioning following puree trial as only small amount was swallowed. Pt with improved tongue mobility today but still very limited. Wet vocal quality noted at end of session- cued pt to cough which was productive. Recommend continuing NPO status with meds via alternative means and frequent oral care. Would like to do MBS prior to pt moving on to next level of care to determine if any safe consistencies/ strategies; however, do not feel that pt is ready for it today due to continued decreased secretion management. Noted that plan is for PEG on Monday 6/26. Will continue to follow for PO readiness/ consideration of MBS.   HPI HPI: [name] is a 65 year old gentleman who presents with dysarthria. 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 He had associated lightheadedness and gait instability where he would "fade" to the left when trying to walk straight. MRI-Acute RIGHT MCA territory lenticulostriate infarct. Patient with worsening speech and LUE symptoms and a repeat MRI showed more prominent right BG/CR infarct. CTA showed right ICA soft plaque with 50% stenosis.  PMHx-CVA in September 2016 where MRI showed multiple acute tiny right posterior frontal lobe infarcts in the MCA distribution, multiple CVAs, Lt hip fx, HTN, HLD, diet controlled T2DM, and tobacco use.      SLP Plan  Continue with current plan of care     Recommendations  Diet recommendations: NPO Medication Administration: Via alternative means             Oral Care Recommendations: Oral care QID;Staff/trained caregiver to provide oral care Follow up Recommendations: Inpatient Rehab;Skilled Nursing facility Plan: Continue with current plan of care     [place], Sean Collins, [place], CCC-SLP 10/29/2015, 10:15 AM [phone]

## 2015-10-29 NOTE — Progress Notes (Signed)
Patient seen and examined. Case d/w residents in detail. I agree with findings and plan as documented in Dr. Serita Grit note.  Patient with persistent weakness and and dysarthria. Will c/w PT/OT/SLP while here. Will need SNF placement after G tube placed on Monday. C/w asa. Hold plavix for G tube placement.   C/w IV unasyn for aspiration PNA.

## 2015-10-29 NOTE — Clinical Social Work Note (Signed)
CSW acknowledged CSW consult for SNF placement.  CSW to complete assessment at a later time, due to patient in procedure.  CSW to continue to follow patient's progress, throughout discharge planning.  Jones Broom. [place], MSW, [place] 10/29/2015 6:11 PM

## 2015-10-30 ENCOUNTER — Telehealth: Payer: Self-pay | Admitting: Vascular Surgery

## 2015-10-30 LAB — GLUCOSE, CAPILLARY
GLUCOSE-CAPILLARY: 151 mg/dL — AB (ref 65–99)
GLUCOSE-CAPILLARY: 113 mg/dL — AB (ref 65–99)
GLUCOSE-CAPILLARY: 118 mg/dL — AB (ref 65–99)
Glucose-Capillary: 115 mg/dL — ABNORMAL HIGH (ref 65–99)
Glucose-Capillary: 128 mg/dL — ABNORMAL HIGH (ref 65–99)
Glucose-Capillary: 133 mg/dL — ABNORMAL HIGH (ref 65–99)

## 2015-10-30 LAB — MAGNESIUM: MAGNESIUM: 1.7 mg/dL (ref 1.7–2.4)

## 2015-10-30 LAB — BASIC METABOLIC PANEL
ANION GAP: 7 (ref 5–15)
BUN: 15 mg/dL (ref 6–20)
CALCIUM: 9.3 mg/dL (ref 8.9–10.3)
CO2: 24 mmol/L (ref 22–32)
Chloride: 113 mmol/L — ABNORMAL HIGH (ref 101–111)
Creatinine, Ser: 0.92 mg/dL (ref 0.61–1.24)
GFR calc Af Amer: >60 mL/min (ref >60)
GLUCOSE: 103 mg/dL — AB (ref 65–99)
POTASSIUM: 3.7 mmol/L (ref 3.5–5.1)
Sodium: 144 mmol/L (ref 135–145)

## 2015-10-30 MED ORDER — AMLODIPINE BESYLATE 10 MG PO TABS: 10.0000 mg | ORAL_TABLET | Freq: Every day | ORAL | Status: DC

## 2015-10-30 MED ORDER — AMLODIPINE BESYLATE 5 MG PO TABS
5.0000 mg | ORAL_TABLET | Freq: Every day | ORAL | Status: DC
  Administered 2015-10-30: 5 mg via ORAL
  Filled 2015-10-30: qty 1

## 2015-10-30 NOTE — Telephone Encounter (Signed)
Sched lab 7/24 at 3:30 and MD 7/27 at 3:15. Spoke to pt to inform them.

## 2015-10-30 NOTE — Progress Notes (Addendum)
   Subjective: He was sleeping this morning and still having difficulty clearing his secretions. He is sleeping comfortably and would like to have water. I explained to him that we would reassess his swallowing function to say and offered him mouth swabs though he politely declined.   Objective: Vital signs in last 24 hours: Filed Vitals:   10/30/15 0155 10/30/15 0500 10/30/15 0609 10/30/15 1002  BP: 172/77  172/60 175/84  Pulse: 62  61 62  Temp: 97.9 F (36.6 C)  98.7 F (37.1 C) 98.5 F (36.9 C)  TempSrc: Oral  Oral Oral  Resp: 20  20 20   Height:      Weight:  169 lb 6.4 oz (76.839 kg)    SpO2: 96%  98% 96%   Weight change:  No intake or output data in the 24 hours ending 10/30/15 1029  General: resting in bed Cardiac: RRR, no rubs, murmurs or gallops Pulm: no respiratory distress, rhoncherous Abd: soft, nontender Ext: warm and well perfused, no pedal edema Neuro: dysarthric with nearly unintelligible speech, strength 0/5 LUE, LLE 3/5, 5/5 right upper and lower extremities  Assessment/Plan: Principal Problem:   CVA (cerebral infarction) Active Problems:   Hypertension associated with diabetes (HCC)   Gout   History of stroke   Tobacco use disorder   HLD (hyperlipidemia)   Dysphagia due to recent stroke   Aspiration pneumonia of both lower lobes (HCC)   Left hemiparesis (HCC)  Acute Right MCA territory lenticulostriate infarct: VVS deferring CEA as an outpatient procedure given current neurological status. Patient now has essentially no function in LUE and minimal movement against gravity with LLE and continued severe dysarthria. -Appreciate PT/OT/SLP assistance -CIR placement pending -Continue Aspirin 325 mg daily -Hold Plavix 75 mg for planned PEG tube placement on 6/26 -Follow-up with VVS outpatient in 4-6 weeks  Dysphagia and Aspiration Pneumonia: CXR 6/20 concerning for aspiration pneumonia. Started antibiotics given patient's dysphagia, elevated white count,  and worsened neurological deficits. Will complete a 7 day course. IR consulted for PEG placement prior to discharge. Holding Plavix for 5 days for planned procedure on Monday (6/26). Will replace NG tube as tolerated for meds and nutrition over the weekend. -IR consulted, appreciate assistance -IV Unasyn per pharmacy (6/20 >> 6/26) -Monitor CBC, BMP -Diet strict NPO, unable to complete MBS due to inability to control secretions -SLP following, appreciate assistance -Continue NG tube, meds per tube -Planned PEG tube placement 6/26 by IR -Discontinue IV fluids though resume if not receiving tube feeds  Hypokalemia: K 3.7 this morning. Resolved   Hypertension: BP trending 164-173/70s over the last 24 hours with goals systolic BP 123456. Home medications include Enalapril 20 mg qd, Atenolol 50 mg twice daily, Amlodipine 10 mg daily. -Start back amlodipine 5mg  daily  HLD: Continue Lipitor 40 mg  Dispo:  Deferred at this time, awaiting improvement of current medical problems. Anticipate discharge to SNF after PEG tube placement.  The patient does have a current PCP with the [facility] (Provider Default, MD) and does not need an [facility] hospital follow-up appointment after discharge.  The patient does not have transportation limitations that hinder transportation to clinic appointments.    LOS: 7 days   Riccardo Dubin, MD 10/30/2015, 10:29 AM

## 2015-10-30 NOTE — Progress Notes (Signed)
Speech Language Pathology Treatment: Dysphagia  Patient Details Name: Sean Collins MRN: [id number] DOB: 1950-07-12 Today's Date: 10/30/2015 Time: [id number] SLP Time Calculation (min) (ACUTE ONLY): 19 min  Assessment / Plan / Recommendation Clinical Impression  ST follow up for PO readiness vs need for objective swallow study.  When ST entered the room the patient was noted to have a very strong wet cough suggesting continued issues with management of secretions.  Oral care was provided and the patient did not appear to have a gag reflex.  The patient was presented with an ice chip and inconsistently appeared to manipulate the material even given max cues.  The patient required some oral suctioning to remove material when no attempts were made to manipulate the material.  When the patient did trigger pharyngeal swallow it was very delayed and required max cues.  The patient required oral suctioning after each bolus to ensure that his oral cavity was clear.  The safest diet remains NPO.  Suspect that long term non oral nutrition will need to be strongly considered.  ST will continue to follow for improvement and readiness for objective study.     HPI HPI: Sean Collins is a 65 year old gentleman who presents with dysarthria. 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 He had associated lightheadedness and gait instability where he would "fade" to the left when trying to walk straight. MRI-Acute RIGHT MCA territory lenticulostriate infarct. Patient with worsening speech and LUE symptoms and a repeat MRI showed more prominent right BG/CR infarct. CTA showed right ICA soft plaque with 50% stenosis. PMHx-CVA in September 2016 where MRI showed multiple acute tiny right posterior frontal lobe infarcts in the MCA distribution, multiple CVAs, Lt hip fx, HTN, HLD, diet controlled T2DM, and tobacco use.      SLP Plan  Continue with  current plan of care     Recommendations  Diet recommendations: NPO Medication Administration: Via alternative means             Oral Care Recommendations: Oral care QID;Staff/trained caregiver to provide oral care Follow up Recommendations: Inpatient Rehab;Skilled Nursing facility Plan: Continue with current plan of care     [place], [place], [place] [facility] SLP [phone]  Lamar Sprinkles 10/30/2015, 9:24 AM

## 2015-10-30 NOTE — Telephone Encounter (Signed)
-----   Message from Denman George, RN sent at 10/26/2015  4:04 PM EDT ----- Regarding: needs bilat carotid duplex and OV with Dr. Oneida Alar in 4-6 weeks   ----- Message -----    From: Elam Dutch, MD    Sent: 10/26/2015   3:25 PM      To: Vvs Charge Pool  Please schedule pt to see me in 4-6 weeks with bilateral carotid duplex  Can go in brackets on list  [employer]

## 2015-10-31 LAB — GLUCOSE, CAPILLARY
GLUCOSE-CAPILLARY: 144 mg/dL — AB (ref 65–99)
GLUCOSE-CAPILLARY: 121 mg/dL — AB (ref 65–99)
GLUCOSE-CAPILLARY: 105 mg/dL — AB (ref 65–99)
Glucose-Capillary: 120 mg/dL — ABNORMAL HIGH (ref 65–99)
Glucose-Capillary: 138 mg/dL — ABNORMAL HIGH (ref 65–99)

## 2015-10-31 LAB — BASIC METABOLIC PANEL
ANION GAP: 6 (ref 5–15)
BUN: 14 mg/dL (ref 6–20)
CALCIUM: 8.8 mg/dL — AB (ref 8.9–10.3)
CO2: 26 mmol/L (ref 22–32)
Chloride: 112 mmol/L — ABNORMAL HIGH (ref 101–111)
Creatinine, Ser: 0.98 mg/dL (ref 0.61–1.24)
Glucose, Bld: 138 mg/dL — ABNORMAL HIGH (ref 65–99)
POTASSIUM: 3.3 mmol/L — AB (ref 3.5–5.1)
Sodium: 144 mmol/L (ref 135–145)

## 2015-10-31 LAB — CBC
HEMATOCRIT: 35.7 % — AB (ref 39.0–52.0)
Hemoglobin: 11.7 g/dL — ABNORMAL LOW (ref 13.0–17.0)
MCH: 26.8 pg (ref 26.0–34.0)
MCHC: 32.8 g/dL (ref 30.0–36.0)
MCV: 81.7 fL (ref 78.0–100.0)
Platelets: 264 10*3/uL (ref 150–400)
RBC: 4.37 MIL/uL (ref 4.22–5.81)
RDW: 14.7 % (ref 11.5–15.5)
WBC: 8.1 10*3/uL (ref 4.0–10.5)

## 2015-10-31 MED ORDER — POTASSIUM CHLORIDE 20 MEQ/15ML (10%) PO SOLN
40.0000 meq | Freq: Every day | ORAL | Status: DC
  Administered 2015-10-31 – 2015-11-02 (×3): 40 meq
  Filled 2015-10-31 (×4): qty 30

## 2015-10-31 MED ORDER — AMOXICILLIN-POT CLAVULANATE 875-125 MG PO TABS
1.0000 | ORAL_TABLET | Freq: Two times a day (BID) | ORAL | Status: AC
  Administered 2015-10-31 – 2015-11-01 (×3): 1 via NASOGASTRIC
  Filled 2015-10-31 (×3): qty 1

## 2015-10-31 MED ORDER — AMLODIPINE BESYLATE 10 MG PO TABS: 10.0000 mg | ORAL_TABLET | Freq: Every day | ORAL | Status: DC

## 2015-10-31 MED ORDER — ENOXAPARIN SODIUM 40 MG/0.4ML ~~LOC~~ SOLN: 40.0000 mg | SUBCUTANEOUS | Status: DC

## 2015-10-31 MED ORDER — AMLODIPINE BESYLATE 10 MG PO TABS
10.0000 mg | ORAL_TABLET | Freq: Every day | ORAL | Status: DC
  Administered 2015-10-31 – 2015-11-02 (×3): 10 mg
  Filled 2015-10-31 (×3): qty 1

## 2015-10-31 NOTE — Progress Notes (Signed)
Barium administered to patient after verifying with MD time to be given. MD stated RN should go with notes written in Care Order Instructions since person writing note is from radiology. Administered 50cc Barium to patient through [facility] T[facility] per care order instruction.

## 2015-10-31 NOTE — Progress Notes (Signed)
   Subjective: Patient sitting up in bed, NG tube in place. He is tolerating the tube okay this morning. He feels well this morning and says he has been working well with therapy. He is asking for coffee. I explained to him why he cannot have any food or drink, he seemed to understand. PEG tube placement planned for tomorrow.  Objective: Vital signs in last 24 hours: Filed Vitals:   10/30/15 2210 10/31/15 0149 10/31/15 0543 10/31/15 0957  BP: 158/82 170/79 182/84 169/83  Pulse: 66 57 62 65  Temp: 97.9 F (36.6 C) 98 F (36.7 C) 98.7 F (37.1 C) 98.6 F (37 C)  TempSrc: Oral Oral Oral Oral  Resp:    20  Height:      Weight:   169 lb 12.8 oz (77.021 kg)   SpO2: 92% 95% 92% 95%   Weight change: 6.4 oz (0.181 kg)  Intake/Output Summary (Last 24 hours) at 10/31/15 1232 Last data filed at 10/30/15 1454  Gross per 24 hour  Intake      0 ml  Output    175 ml  Net   -175 ml    General: resting in bed Cardiac: RRR, no rubs, murmurs or gallops Pulm: no respiratory distress, rhoncherous Abd: soft, nontender Ext: warm and well perfused, no pedal edema Neuro: Continued dysarthria, strength 0/5 LUE, LLE 1/5, 5/5 right upper and lower extremities, EOMI  Assessment/Plan: Principal Problem:   CVA (cerebral infarction) Active Problems:   Hypertension associated with diabetes (HCC)   Gout   History of stroke   Tobacco use disorder   HLD (hyperlipidemia)   Dysphagia due to recent stroke   Aspiration pneumonia of both lower lobes (HCC)   Left hemiparesis (HCC)  Acute Right MCA territory lenticulostriate infarct: VVS deferring CEA as an outpatient procedure given current neurological status. Patient now has essentially no function in LUE and minimal movement against gravity with LLE and continued severe dysarthria. -Appreciate PT/OT/SLP assistance -CIR signed off -Continue Aspirin 325 mg daily -Hold Plavix 75 mg for planned PEG tube placement on 6/26 -Follow-up with VVS outpatient in  4-6 weeks  Dysphagia and Aspiration Pneumonia: CXR 6/20 concerning for aspiration pneumonia. Started antibiotics given patient's dysphagia, elevated white count, and worsened neurological deficits. Will complete a 7 day course. IR consulted for PEG placement prior to discharge. Holding Plavix for 5 days for planned procedure on Monday (6/26). Continue NG tube as tolerated for meds and nutrition. -IR consulted, appreciate assistance -IV Unasyn per pharmacy (6/20 >> 6/26) -Monitor CBC, BMP -Diet strict NPO, unable to complete MBS due to inability to control secretions -SLP following, appreciate assistance -Continue NG tube, meds per tube -Planned PEG tube placement 6/26 by IR -Discontinue IV fluids though resume if not receiving tube feeds  Hypokalemia: Repleting as needed.  Hypertension: BPs above goal systolic BP 123456. Home medications include Enalapril 20 mg qd, Atenolol 50 mg twice daily, Amlodipine 10 mg daily. -Increase amlodipine to 10 mg daily  HLD: Continue Lipitor 40 mg  Dispo:  Deferred at this time, awaiting improvement of current medical problems. Anticipate discharge to SNF after PEG tube placement.  The patient does have a current PCP with the [facility] (Provider Default, MD) and does not need an [facility] hospital follow-up appointment after discharge.  The patient does not have transportation limitations that hinder transportation to clinic appointments.    LOS: 8 days   Zada Finders, MD 10/31/2015, 12:32 PM

## 2015-11-01 ENCOUNTER — Inpatient Hospital Stay (HOSPITAL_COMMUNITY): Payer: Medicare Other

## 2015-11-01 LAB — GLUCOSE, CAPILLARY
GLUCOSE-CAPILLARY: 118 mg/dL — AB (ref 65–99)
Glucose-Capillary: 101 mg/dL — ABNORMAL HIGH (ref 65–99)
Glucose-Capillary: 101 mg/dL — ABNORMAL HIGH (ref 65–99)
Glucose-Capillary: 107 mg/dL — ABNORMAL HIGH (ref 65–99)
Glucose-Capillary: 114 mg/dL — ABNORMAL HIGH (ref 65–99)
Glucose-Capillary: 127 mg/dL — ABNORMAL HIGH (ref 65–99)

## 2015-11-01 LAB — CBC
HCT: 38.2 % — ABNORMAL LOW (ref 39.0–52.0)
Hemoglobin: 12.8 g/dL — ABNORMAL LOW (ref 13.0–17.0)
MCH: 27.4 pg (ref 26.0–34.0)
MCHC: 33.5 g/dL (ref 30.0–36.0)
MCV: 81.6 fL (ref 78.0–100.0)
Platelets: 264 K/uL (ref 150–400)
RBC: 4.68 MIL/uL (ref 4.22–5.81)
RDW: 14.9 % (ref 11.5–15.5)
WBC: 9.6 K/uL (ref 4.0–10.5)

## 2015-11-01 LAB — BASIC METABOLIC PANEL
Anion gap: 6 (ref 5–15)
BUN: 16 mg/dL (ref 6–20)
CHLORIDE: 111 mmol/L (ref 101–111)
CO2: 26 mmol/L (ref 22–32)
Calcium: 9.1 mg/dL (ref 8.9–10.3)
Creatinine, Ser: 0.98 mg/dL (ref 0.61–1.24)
GFR calc non Af Amer: >60 mL/min (ref >60)
Glucose, Bld: 137 mg/dL — ABNORMAL HIGH (ref 65–99)
POTASSIUM: 3.5 mmol/L (ref 3.5–5.1)
SODIUM: 143 mmol/L (ref 135–145)

## 2015-11-01 MED ORDER — ENOXAPARIN SODIUM 40 MG/0.4ML ~~LOC~~ SOLN
40.0000 mg | SUBCUTANEOUS | Status: DC
  Administered 2015-11-02: 40 mg via SUBCUTANEOUS
  Filled 2015-11-01: qty 0.4

## 2015-11-01 NOTE — Progress Notes (Signed)
Patient seen and examined. Case d/w residents in detail. I agree with findings and plan as documented in Dr. Serita Grit note.  Patient is scheduled for a G tube placement today. No improvement noted in speech or left sided weakness. He will need placement in SNF once G tube is in place. No further w/u for now. C/w asa. statin for now. Resume plavix once ok with IR.   Patient with aspiration PNA - leukocytosis now resolved. Unasyn dc'd. Will complete course of abx with PO augmentin

## 2015-11-01 NOTE — Progress Notes (Signed)
Speech Language Pathology Treatment: Dysphagia, motor speech Patient Details Name: Sean Collins MRN: [id number] DOB: Feb 14, 1951 Today's Date: 11/01/2015 Time: [id number] SLP Time Calculation (min) (ACUTE ONLY): 22 min  Assessment / Plan / Recommendation Clinical Impression  Dysphagia treatment provided today for PO trials. Pt initially with wet vocal quality prior to any PO intake; cued pt to cough to clear secretions. Provided oral care with suction. Provided trials of ice chips and puree consistencies. Across trials, pt with inconsistent pharyngeal swallow trigger which occurs after at least 10 seconds after oral holding. Pt with delayed cough/ wet vocal quality following 2 of 4 ice chip trials. Puree trials were unsuccessful today as pt tended to push bolus forward in mouth instead of posterior transit. Pt still not ready for any additional PO trials. Recommend continuing strict NPO status. Pt currently with NG tube; RN reported that plan is for PEG next date. Will continue to follow for PO trials/ MBS readiness.   Motor speech treatment provided. Pt's speech is ~40% intelligible today with increased use of consonants; continued significant difficulty producing consonant sounds requiring lingual movement. Attempted lingual exercises; pt able to do anterior/ posterior movement exercises today but not able to do exercises requiring tongue to move superiorly or laterally. Will continue to follow for motor speech treatment.   HPI HPI: [name] is a 65 year old gentleman who presents with dysarthria. 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 He had associated lightheadedness and gait instability where he would "fade" to the left when trying to walk straight. MRI-Acute RIGHT MCA territory lenticulostriate infarct. Patient with worsening speech and LUE symptoms and a repeat MRI showed more prominent right BG/CR infarct.  CTA showed right ICA soft plaque with 50% stenosis. PMHx-CVA in September 2016 where MRI showed multiple acute tiny right posterior frontal lobe infarcts in the MCA distribution, multiple CVAs, Lt hip fx, HTN, HLD, diet controlled T2DM, and tobacco use.      SLP Plan  Continue with current plan of care     Recommendations  Diet recommendations: NPO Medication Administration: Via alternative means             Oral Care Recommendations: Oral care QID;Staff/trained caregiver to provide oral care Follow up Recommendations: Inpatient Rehab;Skilled Nursing facility Plan: Continue with current plan of care     [place], [place], [place], CCC-SLP 11/01/2015, 12:27 PM [phone]

## 2015-11-01 NOTE — NC FL2 (Signed)
[place] LEVEL OF CARE SCREENING TOOL     IDENTIFICATION  Patient Name: Sean Collins Birthdate: 02-Sep-1950 Sex: male Admission Date (Current Location): 10/23/2015  [facility] and [place] Number:  Herbalist and Address:  The [place], [place] [address], [place], [place] [address]      Provider Number: [id number]  Attending Physician Name and Address:  Aldine Contes, MD  Relative Name and Phone Number:       Current Level of Care: Hospital Recommended Level of Care: [place] Prior Approval Number:    Date Approved/Denied:   PASRR Number:   [id number]  Discharge Plan: SNF    Current Diagnoses: Patient Active Problem List   Diagnosis Date Noted  . Left hemiparesis ([place])   . Dysphagia due to recent stroke   . Aspiration pneumonia of both lower lobes ([place])   . Tobacco use disorder   . HLD (hyperlipidemia)   . CVA (cerebral infarction) 01/22/2015  . Hypertension associated with diabetes ([place]) 11/16/2011  . Tobacco abuse 11/16/2011  . Hyperlipidemia 11/16/2011  . Diabetes mellitus ([place]) 11/16/2011  . Gout 11/16/2011  . History of stroke 11/16/2011  . CAD (coronary artery disease) 11/16/2011    Orientation RESPIRATION BLADDER Height & Weight     Self, Time, Situation, Place  Normal Continent Weight: 171 lb 1.6 oz (77.61 kg) Height:  5\' 9"  (175.3 cm)  BEHAVIORAL SYMPTOMS/MOOD NEUROLOGICAL BOWEL NUTRITION STATUS   (NONE )  (NONE ) Continent Diet, Feeding tube (PEG tube )  AMBULATORY STATUS COMMUNICATION OF NEEDS Skin   Extensive Assist Verbally Normal                       Personal Care Assistance Level of Assistance  Dressing, Feeding, Bathing Bathing Assistance: Limited assistance Feeding assistance: Maximum assistance Dressing Assistance: Limited assistance     Functional Limitations Info  Speech, Hearing, Sight Sight Info: Impaired Hearing Info: Adequate Speech Info: Adequate     SPECIAL CARE FACTORS FREQUENCY  PT (By licensed PT), OT (By licensed OT)     PT Frequency: 4 OT Frequency: 2            Contractures      Additional Factors Info  Allergies Code Status Info: FULL CODE  Allergies Info: N/A            Current Medications (11/01/2015):  This is the current hospital active medication list Current Facility-Administered Medications  Medication Dose Route Frequency Provider Last Rate Last Dose  . acetaminophen (TYLENOL) suspension 650 mg  650 mg Per NG tube Q6H PRN Zada Finders, MD      . allopurinol (ZYLOPRIM) tablet 300 mg  300 mg Per NG tube Daily Zada Finders, MD   300 mg at 11/01/15 1013  . amLODipine (NORVASC) tablet 10 mg  10 mg Per Tube Daily Zada Finders, MD   10 mg at 11/01/15 1013  . amoxicillin-clavulanate (AUGMENTIN) 875-125 MG per tablet 1 tablet  1 tablet Per NG tube Q12H Zada Finders, MD   1 tablet at 11/01/15 1013  . aspirin tablet 325 mg  325 mg Per NG tube Daily Zada Finders, MD   325 mg at 11/01/15 1013  . atorvastatin (LIPITOR) tablet 40 mg  40 mg Per NG tube q1800 Zada Finders, MD   40 mg at 10/31/15 2003  . [START ON 11/02/2015] enoxaparin (LOVENOX) injection 40 mg  40 mg Subcutaneous Q24H Ascencion Dike, PA-C      .  pantoprazole sodium (PROTONIX) 40 mg/20 mL oral suspension 40 mg  40 mg Per Tube Daily Zada Finders, MD   40 mg at 11/01/15 1013  . potassium chloride 20 MEQ/15ML (10%) solution 40 mEq  40 mEq Per Tube Daily Zada Finders, MD   40 mEq at 11/01/15 1013     Discharge Medications: Please see discharge summary for a list of discharge medications.  Relevant Imaging Results:  Relevant Lab Results:   Additional Information SSN [id number]  Glendon Axe, MSW, LCSWA [phone] 11/01/2015 3:54 PM

## 2015-11-01 NOTE — Progress Notes (Signed)
Nutrition Follow-up   INTERVENTION:  Continue Jevity 1.5 @ 55 mL/hr via Cortrak NGT Recommend adding 125 mL free water flushes q 3 hours At goal, Tube feeding regimen provides 1980 calories, 84 grams of protein, and 2003 mL free water  Once PEG is placed, recommend restarting feeds at goal rate of 55 ml/hr.   NUTRITION DIAGNOSIS:   Inadequate oral intake related to dysphagia, inability to eat as evidenced by NPO status.  Ongoing  GOAL:   Patient will meet greater than or equal to 90% of their needs  Being Met  MONITOR:   I & O's, Labs, Weight trends, Diet advancement, TF tolerance  REASON FOR ASSESSMENT:   Consult Enteral/tube feeding initiation and management  ASSESSMENT:   Sean Collins is a 65 year old gentleman with PMH of multiple CVAs, HTN, HLD, diet controlled T2DM, and tobacco use who presents with dysarthria. 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 This was around 2 pm, per ED notes, patient unable to give estimate of time this began. He says he was able to speak his intended words, but his speech was dysarthric. He says this was similar to his previous strokes. He had associated lightheadedness and gait instability where he would "fade" to the left when trying to walk straight. He did not fall or lose consciousness. He did not have any chest pain, SOB, palpitations, change in vision, headache, focal weakness, numbness or tingling.  Pt remains NPO with tube feeding infusing via NGT with Jevity 1.5@55  ml/hr. Pt answered questions by nodding. He denies any nausea or abdominal pain with tube feedings. His weight has trended up 3 lbs since admission. Per chart, plan is to have G-tube placed.   Labs reviewed.   Diet Order:  Diet NPO time specified  Skin:  Reviewed, no issues  Last BM:  6/24  Height:   Ht Readings from Last 1 Encounters:  10/23/15 5' 9"  (1.753 m)    Weight:   Wt  Readings from Last 1 Encounters:  11/01/15 171 lb 1.6 oz (77.61 kg)    Ideal Body Weight:  72.72 kg  BMI:  Body mass index is 25.26 kg/(m^2).  Estimated Nutritional Needs:   Kcal:  1900-2300 calories  Protein:  75-90 grams  Fluid:  >/= 1.9L  EDUCATION NEEDS:   No education needs identified at this time  Scarlette Ar RD, LDN Inpatient Clinical Dietitian Pager: [phone] After Hours Pager: [phone]

## 2015-11-01 NOTE — Progress Notes (Signed)
Physical Therapy Treatment Patient Details Name: Sean Collins MRN: [id number] DOB: 1950/07/01 Today's Date: 11/01/2015    History of Present Illness Pt is a 65 y.o. M who presented with dysarthria. MRI revealed Acute RIGHT MCA territory lenticulostriate infarct.  Pt w/ increased weakness Lt UE/LE noted on 6/19 and MRI on 6/19 noted enlargement of Rt posterior corona radiata/posterior Rt lenticular nucleus nonhemorrhagic infarct.  Pt's PMH includes CVA in September 2016 where MRI showed multiple acute tiny right posterior frontal lobe infarcts in the MCA distribution, multiple CVAs, Lt hip fx, HTN, HLD, diet controlled T2DM, and tobacco use.    PT Comments    Pt progressing slowly given limited use of Left side.  Pt having difficulty managing secretions.  Emphasized standing trials for knee control, w/shifting and general balance, before assisted pivot to the chair.  Follow Up Recommendations  SNF     Equipment Recommendations  Other (comment) (TBA next venue)    Recommendations for Other Services       Precautions / Restrictions Precautions Precautions: Fall    Mobility  Bed Mobility Overal bed mobility: Needs Assistance Bed Mobility: Supine to Sit     Supine to sit: Max assist;+2 for safety/equipment     General bed mobility comments: cues for sequencing and assist to come up via R elbow.  Transfers Overall transfer level: Needs assistance Equipment used: 2 person hand held assist Transfers: Sit to/from [facility] Sit to Stand: Mod assist;+2 physical assistance Stand pivot transfers: Mod assist;+2 physical assistance       General transfer comment: cues for hand placement and assist to come forward and stabilize for standing  Ambulation/Gait             General Gait Details: not able at present due to L sided weakness   Stairs            Wheelchair Mobility    Modified Rankin (Stroke Patients Only) Modified Rankin (Stroke  Patients Only) Pre-Morbid Rankin Score: No significant disability Modified Rankin: Severe disability     Balance Overall balance assessment: Needs assistance Sitting-balance support: Feet supported;Single extremity supported Sitting balance-Leahy Scale: Poor     Standing balance support: Single extremity supported Standing balance-Leahy Scale: Poor Standing balance comment: 3 standing trials at EOB, working on upright stance, w/shifting, and  general balance                    Cognition Arousal/Alertness: Awake/alert Behavior During Therapy: Flat affect Overall Cognitive Status: Within Functional Limits for tasks assessed                      Exercises General Exercises - Lower Extremity Hip Flexion/Marching: AROM;Strengthening;Both;10 reps;Supine    General Comments General comments (skin integrity, edema, etc.): pt using Yauncer consistently to manage secretions      Pertinent Vitals/Pain Pain Assessment: Faces Faces Pain Scale: No hurt    Home Living                      Prior Function            PT Goals (current goals can now be found in the care plan section) Acute Rehab PT Goals Patient Stated Goal: not stated PT Goal Formulation: With patient Time For Goal Achievement: 11/09/15 Potential to Achieve Goals: Good Progress towards PT goals: Progressing toward goals    Frequency  Min 3X/week    PT Plan Frequency needs to be updated  Co-evaluation             End of Session   Activity Tolerance: Patient tolerated treatment well Patient left: in chair;with call bell/phone within reach;with chair alarm set     Time: [id number] PT Time Calculation (min) (ACUTE ONLY): 24 min  Charges:  $Therapeutic Activity: 23-37 mins                    G Codes:      Johnathan Heskett, Tessie Fass 11/01/2015, 4:23 PM 11/01/2015  Donnella Sham, PT [phone]  (pager)

## 2015-11-01 NOTE — Care Management Important Message (Signed)
Important Message  Patient Details  Name: Sean Collins MRN: [id number] Date of Birth: [birth year]   Medicare Important Message Given:  Yes    Loann Quill 11/01/2015, 8:55 AM

## 2015-11-01 NOTE — Progress Notes (Signed)
   Subjective: Patient sitting up in bed, NG tube in place. 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 He says he does not want [place] to talk to someone about this until tomorrow. He does not want [place] to contact his brother at this time.  Objective: Vital signs in last 24 hours: Filed Vitals:   11/01/15 0115 11/01/15 0357 11/01/15 0515 11/01/15 0859  BP: 150/80  152/86 156/78  Pulse: 66  72 63  Temp: 99 F (37.2 C)  98 F (36.7 C) 97.8 F (36.6 C)  TempSrc: Oral  Oral Oral  Resp: 20  20 22   Height:      Weight:  171 lb 1.6 oz (77.61 kg)    SpO2: 96%  97% 95%   Weight change: 1 lb 4.8 oz (0.59 kg)  Intake/Output Summary (Last 24 hours) at 11/01/15 1126 Last data filed at 11/01/15 0515  Gross per 24 hour  Intake      0 ml  Output   1250 ml  Net  -1250 ml    General: resting in bed Cardiac: RRR, no rubs, murmurs or gallops Pulm: no respiratory distress, clear to auscultation Abd: soft, nontender Ext: warm and well perfused, no pedal edema Neuro: Continued dysarthria, strength 0/5 LUE, LLE 1/5, 5/5 right upper and lower extremities  Assessment/Plan: Principal Problem:   CVA (cerebral infarction) Active Problems:   Hypertension associated with diabetes (HCC)   Gout   History of stroke   Tobacco use disorder   HLD (hyperlipidemia)   Dysphagia due to recent stroke   Aspiration pneumonia of both lower lobes (HCC)   Left hemiparesis (HCC)  Acute Right MCA territory lenticulostriate infarct: VVS deferring CEA as an outpatient procedure given current neurological status. Patient now has essentially no function in LUE and minimal movement against gravity with LLE and continued severe dysarthria. -Appreciate PT/OT/SLP assistance -CIR signed off, transfer to SNF after PEG placed -Continue Aspirin 325 mg daily -Hold Plavix 75 mg for planned PEG tube placement on 6/26 -Follow-up with VVS outpatient in 4-6  weeks  Dysphagia and Aspiration Pneumonia: CXR 6/20 concerning for aspiration pneumonia. Started antibiotics given patient's dysphagia, elevated white count, and worsened neurological deficits. Will complete a 7 day course, last day today. IR consulted for PEG placement prior to discharge. Holding Plavix for 5 days for planned procedure on Monday (6/26). Continue NG tube as tolerated for meds. -IR consulted, appreciate assistance -D/c'ed Unasyn per pharmacy (6/20 >> 6/25) -Augmentin 875-125 mg BID (6/25 >> 6/26) -Monitor CBC, BMP -Diet strict NPO, unable to complete MBS due to inability to control secretions -SLP following, appreciate assistance -Continue NG tube, meds per tube -Hold tube feeds -Planned PEG tube placement 6/26 by IR   Hypokalemia: Repleting as needed.  Hypertension: BPs at goal systolic BP 123456. Home medications include Enalapril 20 mg qd, Atenolol 50 mg twice daily, Amlodipine 10 mg daily. -Continue amlodipine 10 mg daily  HLD: Continue Lipitor 40 mg  Dispo:  Deferred at this time, awaiting improvement of current medical problems. Anticipate discharge to SNF after PEG tube placement.  The patient does have a current PCP with the [place] (Provider Default, MD) and does not need an [facility] hospital follow-up appointment after discharge.  The patient does not have transportation limitations that hinder transportation to clinic appointments.    LOS: 9 days   Zada Finders, MD 11/01/2015, 11:26 AM

## 2015-11-01 NOTE — Care Management Note (Signed)
Case Management Note  Patient Details  Name: Sean Collins MRN: [id number] Date of Birth: 15-Nov-1950  Subjective/Objective:                    Action/Plan: Pt in with CVA. Plan is for PEG placement in the am and then SNF when tolerating tube feedings. CM following for d/c needs.   Expected Discharge Date:                  Expected Discharge Plan:  Skilled Nursing Facility  In-House Referral:  Clinical Social Work  Discharge planning Services  CM Consult  Post Acute Care Choice:    Choice offered to:     DME Arranged:    DME Agency:     HH Arranged:    [place] Agency:     Status of Service:  In process, will continue to follow  If discussed at Long Length of Stay Meetings, dates discussed:    Additional Comments:  Pollie Friar, RN 11/01/2015, 2:22 PM

## 2015-11-02 ENCOUNTER — Inpatient Hospital Stay (HOSPITAL_COMMUNITY): Payer: Medicare Other

## 2015-11-02 DIAGNOSIS — K922 Gastrointestinal hemorrhage, unspecified: Secondary | ICD-10-CM

## 2015-11-02 DIAGNOSIS — R579 Shock, unspecified: Secondary | ICD-10-CM

## 2015-11-02 DIAGNOSIS — Z978 Presence of other specified devices: Secondary | ICD-10-CM

## 2015-11-02 LAB — BASIC METABOLIC PANEL
ANION GAP: 10 (ref 5–15)
BUN: 17 mg/dL (ref 6–20)
CO2: 25 mmol/L (ref 22–32)
Calcium: 9.5 mg/dL (ref 8.9–10.3)
Chloride: 109 mmol/L (ref 101–111)
Creatinine, Ser: 1.18 mg/dL (ref 0.61–1.24)
GFR calc Af Amer: >60 mL/min (ref >60)
Glucose, Bld: 103 mg/dL — ABNORMAL HIGH (ref 65–99)
POTASSIUM: 4.2 mmol/L (ref 3.5–5.1)
SODIUM: 144 mmol/L (ref 135–145)

## 2015-11-02 LAB — GLUCOSE, CAPILLARY
GLUCOSE-CAPILLARY: 100 mg/dL — AB (ref 65–99)
GLUCOSE-CAPILLARY: 117 mg/dL — AB (ref 65–99)
GLUCOSE-CAPILLARY: 150 mg/dL — AB (ref 65–99)
Glucose-Capillary: 107 mg/dL — ABNORMAL HIGH (ref 65–99)
Glucose-Capillary: 90 mg/dL (ref 65–99)
Glucose-Capillary: 91 mg/dL (ref 65–99)
Glucose-Capillary: 122 mg/dL — ABNORMAL HIGH (ref 65–99)

## 2015-11-02 LAB — CBC
HCT: 39.7 % (ref 39.0–52.0)
HCT: 28.9 % — ABNORMAL LOW (ref 39.0–52.0)
HEMOGLOBIN: 13 g/dL (ref 13.0–17.0)
Hemoglobin: 9.2 g/dL — ABNORMAL LOW (ref 13.0–17.0)
MCH: 26.8 pg (ref 26.0–34.0)
MCH: 26.2 pg (ref 26.0–34.0)
MCHC: 32.7 g/dL (ref 30.0–36.0)
MCHC: 31.8 g/dL (ref 30.0–36.0)
MCV: 81.9 fL (ref 78.0–100.0)
MCV: 82.3 fL (ref 78.0–100.0)
PLATELETS: 291 10*3/uL (ref 150–400)
PLATELETS: 255 10*3/uL (ref 150–400)
RBC: 4.85 MIL/uL (ref 4.22–5.81)
RBC: 3.51 MIL/uL — AB (ref 4.22–5.81)
RDW: 14.9 % (ref 11.5–15.5)
RDW: 14.8 % (ref 11.5–15.5)
WBC: 11.4 10*3/uL — ABNORMAL HIGH (ref 4.0–10.5)
WBC: 14.9 10*3/uL — AB (ref 4.0–10.5)

## 2015-11-02 LAB — SURGICAL PCR SCREEN
MRSA, PCR: POSITIVE — AB
Staphylococcus aureus: POSITIVE — AB

## 2015-11-02 MED ORDER — CHLORHEXIDINE GLUCONATE 0.12 % MT SOLN
15.0000 mL | Freq: Two times a day (BID) | OROMUCOSAL | Status: DC
  Administered 2015-11-02 – 2015-11-08 (×12): 15 mL via OROMUCOSAL
  Filled 2015-11-02 (×10): qty 15

## 2015-11-02 MED ORDER — GLUCAGON HCL RDNA (DIAGNOSTIC) 1 MG IJ SOLR
INTRAMUSCULAR | Status: AC | PRN
  Administered 2015-11-02: 1 mg via INTRAVENOUS

## 2015-11-02 MED ORDER — GLUCAGON HCL RDNA (DIAGNOSTIC) 1 MG IJ SOLR
INTRAMUSCULAR | Status: AC
  Filled 2015-11-02: qty 1

## 2015-11-02 MED ORDER — MUPIROCIN 2 % EX OINT
1.0000 "application " | TOPICAL_OINTMENT | Freq: Two times a day (BID) | CUTANEOUS | Status: AC
  Administered 2015-11-03 – 2015-11-06 (×8): 1 via NASAL
  Filled 2015-11-02 (×3): qty 22

## 2015-11-02 MED ORDER — ASPIRIN 325 MG PO TABS: 325.0000 mg | ORAL_TABLET | Freq: Every day | ORAL | Status: DC

## 2015-11-02 MED ORDER — PANTOPRAZOLE SODIUM 40 MG IV SOLR
8.0000 mg/h | INTRAVENOUS | Status: DC
  Administered 2015-11-04: 8 mg/h via INTRAVENOUS
  Filled 2015-11-02 (×6): qty 80

## 2015-11-02 MED ORDER — MIDAZOLAM HCL 2 MG/2ML IJ SOLN
INTRAMUSCULAR | Status: AC
  Filled 2015-11-02: qty 2

## 2015-11-02 MED ORDER — ALLOPURINOL 100 MG PO TABS
300.0000 mg | ORAL_TABLET | Freq: Every day | ORAL | Status: DC
  Administered 2015-11-03 – 2015-11-08 (×6): 300 mg
  Filled 2015-11-02 (×5): qty 1
  Filled 2015-11-02: qty 3

## 2015-11-02 MED ORDER — ENALAPRIL MALEATE 20 MG PO TABS
20.0000 mg | ORAL_TABLET | Freq: Every day | ORAL | Status: DC
  Administered 2015-11-02: 20 mg
  Filled 2015-11-02: qty 1

## 2015-11-02 MED ORDER — CHLORHEXIDINE GLUCONATE CLOTH 2 % EX PADS
6.0000 | MEDICATED_PAD | Freq: Every day | CUTANEOUS | Status: AC
Start: 2015-11-02 — End: 2015-11-07
  Administered 2015-11-02 – 2015-11-05 (×4): 6 via TOPICAL

## 2015-11-02 MED ORDER — SODIUM CHLORIDE 0.9 % IV BOLUS (SEPSIS)
1000.0000 mL | Freq: Once | INTRAVENOUS | Status: AC
  Administered 2015-11-02: 1000 mL via INTRAVENOUS

## 2015-11-02 MED ORDER — CEFAZOLIN SODIUM-DEXTROSE 2-4 GM/100ML-% IV SOLN
INTRAVENOUS | Status: AC
  Administered 2015-11-02: 2 g via INTRAVENOUS
  Filled 2015-11-02: qty 100

## 2015-11-02 MED ORDER — FENTANYL CITRATE (PF) 100 MCG/2ML IJ SOLN
INTRAMUSCULAR | Status: AC | PRN
  Administered 2015-11-02 (×2): 50 ug via INTRAVENOUS

## 2015-11-02 MED ORDER — CLOPIDOGREL BISULFATE 75 MG PO TABS
75.0000 mg | ORAL_TABLET | Freq: Every day | ORAL | Status: DC
  Filled 2015-11-02: qty 1

## 2015-11-02 MED ORDER — ASPIRIN 81 MG PO CHEW: 81.0000 mg | CHEWABLE_TABLET | Freq: Every day | ORAL | Status: DC

## 2015-11-02 MED ORDER — LIDOCAINE HCL 1 % IJ SOLN
INTRAMUSCULAR | Status: AC
  Administered 2015-11-02: 10 mL
  Filled 2015-11-02: qty 20

## 2015-11-02 MED ORDER — CEFAZOLIN SODIUM-DEXTROSE 2-4 GM/100ML-% IV SOLN
2.0000 g | Freq: Once | INTRAVENOUS | Status: AC
  Administered 2015-11-02: 2 g via INTRAVENOUS

## 2015-11-02 MED ORDER — IOPAMIDOL (ISOVUE-300) INJECTION 61%
INTRAVENOUS | Status: AC
  Administered 2015-11-02: 20 mL
  Filled 2015-11-02: qty 50

## 2015-11-02 MED ORDER — CETYLPYRIDINIUM CHLORIDE 0.05 % MT LIQD
7.0000 mL | Freq: Two times a day (BID) | OROMUCOSAL | Status: DC
  Administered 2015-11-02 – 2015-11-08 (×11): 7 mL via OROMUCOSAL

## 2015-11-02 MED ORDER — ATORVASTATIN CALCIUM 40 MG PO TABS
40.0000 mg | ORAL_TABLET | Freq: Every day | ORAL | Status: DC
  Administered 2015-11-02 – 2015-11-07 (×6): 40 mg
  Filled 2015-11-02 (×7): qty 1

## 2015-11-02 MED ORDER — ASPIRIN 325 MG PO TABS
325.0000 mg | ORAL_TABLET | Freq: Every day | ORAL | Status: AC
  Administered 2015-11-02: 325 mg
  Filled 2015-11-02: qty 1

## 2015-11-02 MED ORDER — ACETAMINOPHEN 160 MG/5ML PO SUSP
650.0000 mg | Freq: Four times a day (QID) | ORAL | Status: DC | PRN
  Filled 2015-11-02: qty 20.3

## 2015-11-02 MED ORDER — SODIUM CHLORIDE 0.9 % IV SOLN
INTRAVENOUS | Status: AC
  Administered 2015-11-02: 17:00:00 via INTRAVENOUS

## 2015-11-02 MED ORDER — FENTANYL CITRATE (PF) 100 MCG/2ML IJ SOLN
INTRAMUSCULAR | Status: AC
  Filled 2015-11-02: qty 2

## 2015-11-02 MED ORDER — SODIUM CHLORIDE 0.9 % IV BOLUS (SEPSIS): 1000.0000 mL | Freq: Once | INTRAVENOUS | Status: DC

## 2015-11-02 NOTE — Progress Notes (Signed)
   Subjective: PEG tube placed this morning. Patient sitting up in bed, denies any pain. He has continued oral secretions, is able to suction on his own using his right arm.   Objective: Vital signs in last 24 hours: Filed Vitals:   11/02/15 1004 11/02/15 1007 11/02/15 1050 11/02/15 1323  BP: 144/95 155/89 152/85 156/83  Pulse: 75 71 71 81  Temp:   98.2 F (36.8 C) 98.9 F (37.2 C)  TempSrc:   Oral Oral  Resp: 20 18 16 18   Height:      Weight:      SpO2: 100% 100% 100% 100%   Weight change:   Intake/Output Summary (Last 24 hours) at 11/02/15 1502 Last data filed at 11/01/15 1903  Gross per 24 hour  Intake      0 ml  Output    400 ml  Net   -400 ml    General: resting in bed, no acute distress Cardiac: RRR, no rubs, murmurs or gallops Pulm: no respiratory distress, clear to auscultation Abd: soft, nontender, PEG tube in place with clean dry dressing in place Ext: warm and well perfused, no pedal edema Neuro: Continued dysarthria, strength 0/5 LUE, LLE 1/5, 5/5 right upper and lower extremities  Assessment/Plan: Principal Problem:   CVA (cerebral infarction) Active Problems:   Hypertension associated with diabetes (HCC)   Gout   History of stroke   Tobacco use disorder   HLD (hyperlipidemia)   Dysphagia due to recent stroke   Aspiration pneumonia of both lower lobes (HCC)   Left hemiparesis ([place])  Acute Right MCA territory lenticulostriate infarct: VVS deferring CEA as an outpatient procedure given current neurological status. Patient now has essentially no function in LUE and minimal movement against gravity with LLE and continued severe dysarthria. -Appreciate PT/OT/SLP assistance -CIR signed off, transfer to SNF when bed available -Continue Aspirin 325 today, change to 81 mg daily tomorrow when Plavix back on -Resume Plavix 75 mg per tube tomorrow -Follow-up with VVS outpatient in 4-6 weeks for CEA evaluation  Dysphagia and Aspiration Pneumonia: CXR 6/20  concerning for aspiration pneumonia. Started antibiotics given patient's dysphagia, elevated white count, and worsened neurological deficits. 7 day course completed on 6/26. IR performed PEG placement on 6/27 after holding Plavix for 5 days. Can resume meds per tube today. Can resume tube feeds 24 hours after PEG placement. -IR consulted, appreciate assistance -D/c'ed Unasyn per pharmacy (6/20 >> 6/25) -Completed Augmentin 875-125 mg BID (6/25 >> 6/26) -Diet strict NPO, unable to complete MBS due to inability to control secretions -SLP following, appreciate assistance -Hold tube feeds, resume 24 hours after PEG placement -IV NS @ 100 mL/hr  Hypokalemia: Resolved. -Monitor BMP -Replete as needed  Hypertension: BPs elevated at [id number] systolic. Home medications include Enalapril 20 mg qd, Atenolol 50 mg twice daily, Amlodipine 10 mg daily. -Continue amlodipine 10 mg daily -Add back home Enalapril 20 mg daily  HLD: Continue Lipitor 40 mg  Dispo:  Anticipate discharge to SNF when bed available.  The patient does have a current PCP with the [place] (Provider Default, MD) and does not need an [facility] hospital follow-up appointment after discharge.  The patient does not have transportation limitations that hinder transportation to clinic appointments.    LOS: 10 days   Zada Finders, MD 11/02/2015, 3:02 PM

## 2015-11-02 NOTE — Progress Notes (Signed)
Paged by nursing staff at 8:40 pm stating patient vomited approximately 2 cups of bright red blood and had approximately 3/4 cup of dark colored stools just now. Patient had PEG tube placement done earlier today. I went and evaluated the patient at bedside immediately. Patient was somnolent but opening his eyes and following commands. He is not able to communicate verbally due to history of CVA. When asked about pain, patient pointed to his abdomen and nodded yes. Vitals: Initially BP 100/50, P 89, repeat (a few minutes later) BP 88/55, P 100 Heart: RRR. S1 and S2 appreciated. Lungs: Anterior lung fields clear to auscultation. Abdomen: +BS. There is guarding and tenderness to palpation in the left upper quadrant and left lower quadrant. No blood or signs of infection noted at the site of PEG tube placement.   A/P: Patient's current presentation is concerning for an upper GI bleed. I spoke to interventional radiology physician on call (Dr. Kathlene Cote) and he thought this could likely be a venous bleed and did not think patient needs emergent arteriography tonight. However, he suggested contacting PCCM and surgery immediately if signs of hemodynamic compromise. I then spoke to Dr. Alva Garnet (PCCM) and he suggested transferring the patient to the ICU immediately. I also spoke to Dr. Havery Moros (Gastroenterology) and he will be evaluating the patient in the ICU soon.  -STAT transfer to ICU -Bolus of 1L NS. Continue maintenance fluids wide open. -2 Large bore IVs -STAT CBC -Type and cross; prepare 2U PRBCs -HOLD anticoagulation and antiplatelet agents - Lovenox, Aspirin, Plavix -HOLD all antihypertensives - Norvasc, Enalapril -Protonix 80 mg gtt -STAT Abdominal X-ray to r/o possible perforation -Keep head of bed elevated to reduce chances of aspiration  -Ordered nursing staff to place NGT -CTM vitals closely  -Appreciate help from IR, PCCM, and GI.   Update:  STAT abdominal x-ray is not showing signs of  perforation. Hgb 9.2 (significant 4 point drop from previous reading). Added another 1L bolus of NS. GI is taking patient for an emergent EGD. PCCM is monitoring the patient overnight.

## 2015-11-02 NOTE — Progress Notes (Signed)
Patient seen and examined. Case d/w residents in detail. I agree with findings and plan as documented in Dr. Serita Grit note.  Patient is now s/p G tube placement. Will resume feeds and plavix in AM. C/w asa and statin for CVA. Will need better BP control - will consider starting lisinopril in AM since he will need tighter control of BP as he is out of the permissive HTN window now. C/w PT/OT/SLP. Will need outpatient placement at SNF - should be stable for d/c to SNF in AM.   Patient has completed course of abx for aspiration PNA. Mild leukocytosis today. No fevers. Will monitor closely off abx.

## 2015-11-02 NOTE — Procedures (Signed)
Successful bedside removal of intact pull through G-tube. EBL: None No immediate post procedural complications.  Ronny Bacon, MD Pager #: [phone]

## 2015-11-02 NOTE — Consult Note (Signed)
PULMONARY / CRITICAL CARE MEDICINE   Name: Sean Collins MRN: [id number] DOB: [birth year]    ADMISSION DATE:  10/23/2015 CONSULTATION DATE:  11/02/15  REFERRING MD:  Dareen Piano (IMTS)  CHIEF COMPLAINT:  GI Bleed  HISTORY OF PRESENT ILLNESS:  Pt is dysarthric; therefore, this HPI is obtained from chart review. Sean Collins is a 65 y.o. male with PMH including but not limited to previous CVA's in 2016 thought to be secondary to atherosclerosis.  He was admitted 10/23/15 with acute right MCA infarct.  He was also found to have right ICA stenosis (40-59%).  He had worsening deficits following admission and repeat MRI on 6/19 showed evolution of initial stroke with no new infarct.  VVS were initially planning on CEA prior to discharge; however, due to his ongoing neuro deficits, decided to postpone this to an outpatient procedure once he has recovered.  He had problems with dysphagia and suspected aspiration PNA; therefore, PEG tube ended up being placed on morning of 11/02/15 (placed by IR).  Later that evening, pt had vomiting episode consisting of 2 cups of bright red blood as well as a BM consisting of 3/4 cup of dark colored stools.  He had associated borderline BP's during these episodes and complained of abdominal pain.  IR was contacted by primary team and they felt that bleed was likely venous and did not require emergent arteriography.  They recommended transfer to ICU.  PCCM was subsequently called in consultation.  GI was also contacted by primary team and they will plan to see pt tonight.   PAST MEDICAL HISTORY :  He  has a past medical history of Hypertension; Hyperlipidemia; Stroke [facility]); and Diabetes mellitus without complication ([place]).  PAST SURGICAL HISTORY: He  has no past surgical history on file.  No Known Allergies  No current facility-administered medications on file prior to encounter.   Current Outpatient Prescriptions on File Prior to Encounter  Medication  Sig  . allopurinol (ZYLOPRIM) 300 MG tablet Take 300 mg by mouth daily.  Marland Kitchen aspirin EC 81 MG tablet Take 81 mg by mouth daily.   Marland Kitchen atenolol (TENORMIN) 50 MG tablet Take 1 tablet (50 mg total) by mouth 2 (two) times daily.  Marland Kitchen atorvastatin (LIPITOR) 20 MG tablet Take 1 tablet (20 mg total) by mouth daily at 6 PM.  . carboxymethylcellulose (REFRESH PLUS) 0.5 % SOLN Place 1 drop into both eyes 3 (three) times daily as needed (dry eyes).  . cholecalciferol (VITAMIN D) 1000 UNITS tablet Take 1,000 Units by mouth daily.  . clopidogrel (PLAVIX) 75 MG tablet Take 1 tablet (75 mg total) by mouth daily.  . enalapril (VASOTEC) 20 MG tablet Take 1 tablet (20 mg total) by mouth daily.  . folic acid (FOLVITE) 1 MG tablet Take 1 mg by mouth daily.  Marland Kitchen omeprazole (PRILOSEC) 20 MG capsule Take 20 mg by mouth daily.    FAMILY HISTORY:  His indicated that his brother is deceased.   SOCIAL HISTORY: He  reports that he has been smoking Cigarettes.  He has a 16 pack-year smoking history. He does not have any smokeless tobacco history on file. He reports that he does not drink alcohol or use illicit drugs.  REVIEW OF SYSTEMS:   Patient has significant aphasia, unable to get full history.  SUBJECTIVE:  [place] stool and some vomitus per Therapist, sports.  VITAL SIGNS: BP 102/55 mmHg  Pulse 88  Temp(Src) 98 F (36.7 C) (Oral)  Resp 20  Ht 5\' 9"  (1.753 m)  Wt 171 lb 1.6 oz (77.61 kg)  BMI 25.26 kg/m2  SpO2 99%  HEMODYNAMICS:    VENTILATOR SETTINGS:    INTAKE / OUTPUT: I/O last 3 completed shifts: In: -  Out: 800 [Urine:800]   PHYSICAL EXAMINATION: General: Slow to respond, in NAD. Neuro: A&O x 3, non-focal.  HEENT: [place]/AT. PERRL, sclerae anicteric. Cardiovascular: RRR, no M/R/G.  Lungs: Respirations even and unlabored.  CTA bilaterally, No W/R/R.  Abdomen: BS x 4, soft, NT/ND.  Musculoskeletal: No gross deformities, no edema.  Skin: Intact, warm, no rashes.  LABS:  BMET  Recent Labs Lab  10/31/15 0611 11/01/15 0635 11/02/15 0538  NA 144 143 144  K 3.3* 3.5 4.2  CL 112* 111 109  CO2 26 26 25   BUN 14 16 17   CREATININE 0.98 0.98 1.18  GLUCOSE 138* 137* 103*    Electrolytes  Recent Labs Lab 10/30/15 0628 10/31/15 0611 11/01/15 0635 11/02/15 0538  CALCIUM 9.3 8.8* 9.1 9.5  MG 1.7  --   --   --     CBC  Recent Labs Lab 10/31/15 0611 11/01/15 0635 11/02/15 0538  WBC 8.1 9.6 11.4*  HGB 11.7* 12.8* 13.0  HCT 35.7* 38.2* 39.7  PLT 264 264 291    Coag's No results for input(s): APTT, INR in the last 168 hours.  Sepsis Markers No results for input(s): LATICACIDVEN, PROCALCITON, O2SATVEN in the last 168 hours.  ABG No results for input(s): PHART, PCO2ART, PO2ART in the last 168 hours.  Liver Enzymes No results for input(s): AST, ALT, ALKPHOS, BILITOT, ALBUMIN in the last 168 hours.  Cardiac Enzymes No results for input(s): TROPONINI, PROBNP in the last 168 hours.  Glucose  Recent Labs Lab 11/02/15 0358 11/02/15 0823 11/02/15 1139 11/02/15 1636 11/02/15 2007 11/02/15 2151  GLUCAP 100* 107* 90 91 122* 150*    Imaging Ir Gastrostomy Tube Mod Sed  11/02/2015  INDICATION: History of stroke, now with dysphagia. Request made for percutaneous gastrostomy tube placement for enteric nutrition supplementation. EXAM: PULL TROUGH GASTROSTOMY TUBE PLACEMENT COMPARISON:  Abdominal radiograph - 11/01/2015; CT abdomen pelvis - 10/27/2015 MEDICATIONS: Ancef 2 gm IV; Antibiotics were administered within 1 hour of the procedure. Glucagon 1 mg IV CONTRAST:  20 mL of Isovue 300 administered into the gastric lumen. ANESTHESIA/SEDATION: Moderate (conscious) sedation was employed during this procedure. A total of Fentanyl 100 mcg was administered intravenously. Moderate Sedation Time: 8 minutes. The patient's level of consciousness and vital signs were monitored continuously by radiology nursing throughout the procedure under my direct supervision. FLUOROSCOPY TIME:  2  minutes 48 seconds (15 mGy) COMPLICATIONS: None immediate. PROCEDURE: Informed written consent was obtained from the patient's family following explanation of the procedure, risks, benefits and alternatives. A time out was performed prior to the initiation of the procedure. Ultrasound scanning was performed to demarcate the edge of the left lobe of the liver. Maximal barrier sterile technique utilized including caps, mask, sterile gowns, sterile gloves, large sterile drape, hand hygiene and Betadine prep. The left upper quadrant was sterilely prepped and draped. An oral gastric catheter was inserted into the stomach under fluoroscopy. The existing nasogastric feeding tube was removed. The left costal margin and air / barium opacified transverse colon were identified and avoided. Air was injected into the stomach for insufflation and visualization under fluoroscopy. Under sterile conditions a 17 gauge trocar needle was utilized to access the stomach percutaneously beneath the left subcostal margin after the overlying soft tissues were anesthetized with 1% Lidocaine with epinephrine. Needle  position was confirmed within the stomach with aspiration of air and injection of small amount of contrast. A single T tack was deployed for gastropexy. Over an Amplatz guide wire, a 9-French sheath was inserted into the stomach. A snare device was utilized to capture the oral gastric catheter. The snare device was pulled retrograde from the stomach up the esophagus and out the oropharynx. The 20-French pull-through gastrostomy was connected to the snare device and pulled antegrade through the oropharynx down the esophagus into the stomach and then through the percutaneous tract external to the patient. The gastrostomy was assembled externally. Contrast injection confirms position in the stomach. Several spot radiographic images were obtained in various obliquities for documentation. The patient tolerated procedure well without  immediate post procedural complication. FINDINGS: After successful fluoroscopic guided placement, the gastrostomy tube is appropriately positioned with internal disc against the ventral aspect of the gastric lumen. IMPRESSION: Successful fluoroscopic insertion of a 20-French pull-through gastrostomy tube. The gastrostomy may be used immediately for medication administration and in 24 hrs for the initiation of feeds. Electronically Signed   By: Sandi Mariscal M.D.   On: 11/02/2015 10:27     STUDIES:  MRI brain 6/17 > acute right MCA infarct, nonhemorrhagic. MRI brain 6/19 > interval enlargement of right posterior corona radiata / posterior right lenticular nucleus nonhemorrhagic infarct. CTA neck 6/18 > 50% stenosis at origin of right ICA and 50% stenosis distally.  40% stenosis of left ICA. Echo 6/18 > EF 55-60%.  PA peak 55.  CULTURES: Sputum 6/27 >   ANTIBIOTICS: Ancef 6/278 > 6/27   SIGNIFICANT EVENTS: 6/17 > admit. 6/27 > PEG placed.  Later developed UGIB so transferred to ICU.  LINES/TUBES: PEG 6/27 >  DISCUSSION: 65 y.o. M admitted 6/17 with acute right MCA infarct.  Had severe dysphagia so required PEG on 6/27.  Later developed UGIB so transferred to ICU.  ASSESSMENT / PLAN:  GASTROINTESTINAL A:   UGIB - unclear etiology at this point. Dysphagia - s/p PEG 6/27. GI prophylaxis. Nutrition. P:   Continue PPI gtt. GI consulted by primary team, will see tonight.  Appreciate the assistance. Continue NPO. Resume TF's in AM. SLP following.  NEUROLOGIC A:   Acute right MCA infarct. Bilateral ICA stenosis R > L. P:   Hold ASA, plavix given UGIB. F/u with VVS as outpatient for CEA evaluation.  PULMONARY A: Acute hypoxemic respiratory failure - due to CVA. Concern for aspiration - s/p abx course. P:   Continue supplemental O2 as needed to maintain SpO2 > 92%. Pulmonary hygiene. NPO. CXR in AM.  CARDIOVASCULAR A:  Hypotension - due to UGIB.  Improving with IVF  resuscitation. Bilateral ICA stenosis R > L. Hx HTN, HLD. P:  Continue IVF resuscitation. May require PRBC transfusion. Continue outpatient lipitor. Hold outpatient amlodipine, analapril. Hold ASA, plavix given UGIB. F/u with VVS as outpatient for CEA evaluation.  RENAL A:   No acute issues. P:   NS @ 125. Correct electrolytes as indicated. BMP in AM.  HEMATOLOGIC A:   Anemia - due to UGIB. VTE Prophylaxis. P:  Transfuse for Hgb < 7. SCD's. CBC in AM.  INFECTIOUS A:   No acute issues. P:   Monitor clinically.  ENDOCRINE A:   DM - diet controlled. P:   SSI if glucose consistently > 180.  Family updated: No family bedside.  Interdisciplinary Family Meeting v Palliative Care Meeting:  Due by: 7/5  CC time: 30 minutes.  Montey Hora, PA - [facility] [place]  Pulmonary & Critical Care Medicine Pager: [phone]  or [phone]  Attending Note:  65 year old male with extensive PMH who presents to PCCM after hemorrhage from PEG site that was placed by IR.  Patient had some bleeding overnight and IR recommended consulting PCCM and GI.  GI was called by IMTS and patient was transferred to the ICU.  Upon evaluating the patient, he has had a previous CVA and is unable to communicate fully.  On exam, lungs are clear, PEG site is clean.  No further hemorrhage noted.  GI was called by primary.  Hypotension resolved with volume and f/u Hg is 9.2.  Ok to transfer patient back to SDU and back to IMTS with PCCM off 6/28.  The patient is critically ill with multiple organ systems failure and requires high complexity decision making for assessment and support, frequent evaluation and titration of therapies, application of advanced monitoring technologies and extensive interpretation of multiple databases.   Critical Care Time devoted to patient care services described in this note is  35  Minutes. This time reflects time of care of this signee Dr Jennet Maduro. This critical care  time does not reflect procedure time, or teaching time or supervisory time of PA/NP/Med student/Med Resident etc but could involve care discussion time.  Rush Farmer, M.D. [facility] Pulmonary/Critical Care Medicine. Pager: [phone]. After hours pager: [phone].  11/02/2015, 11:25 PM

## 2015-11-02 NOTE — Progress Notes (Signed)
Pt said to have vomited and pass stool full of blood, had a PEG placed today in interventional radiology, Teaching service on call doc paged and notified, said will come and see pt, cleaned up and made comfortable in bed, pt still c/o of being nauseous, will continue to monitor. [place], Genevie Elman Efe

## 2015-11-02 NOTE — Sedation Documentation (Signed)
Airway was not documented in chart for sedation. Pt appears sleepy, snoring at times, will use IV Fentanyl only instead of conscious sedation.

## 2015-11-02 NOTE — Sedation Documentation (Signed)
Patient is resting comfortably. 

## 2015-11-02 NOTE — Sedation Documentation (Signed)
Prepping pt for procedure

## 2015-11-03 ENCOUNTER — Inpatient Hospital Stay (HOSPITAL_COMMUNITY): Payer: Medicare Other | Admitting: Certified Registered Nurse Anesthetist

## 2015-11-03 ENCOUNTER — Encounter (HOSPITAL_COMMUNITY)
Admission: EM | Disposition: A | Discharge status: Skilled Nursing Facility | Payer: Self-pay | Source: Home / Self Care | Attending: Internal Medicine

## 2015-11-03 ENCOUNTER — Encounter (HOSPITAL_COMMUNITY): Payer: Self-pay

## 2015-11-03 ENCOUNTER — Inpatient Hospital Stay (HOSPITAL_COMMUNITY): Payer: Medicare Other

## 2015-11-03 DIAGNOSIS — E875 Hyperkalemia: Secondary | ICD-10-CM

## 2015-11-03 DIAGNOSIS — I6330 Cerebral infarction due to thrombosis of unspecified cerebral artery: Secondary | ICD-10-CM

## 2015-11-03 DIAGNOSIS — K920 Hematemesis: Secondary | ICD-10-CM | POA: Insufficient documentation

## 2015-11-03 DIAGNOSIS — Z931 Gastrostomy status: Secondary | ICD-10-CM

## 2015-11-03 DIAGNOSIS — N179 Acute kidney failure, unspecified: Secondary | ICD-10-CM

## 2015-11-03 HISTORY — PX: ESOPHAGOGASTRODUODENOSCOPY (EGD) WITH PROPOFOL: SHX5813

## 2015-11-03 LAB — BASIC METABOLIC PANEL
ANION GAP: 11 (ref 5–15)
BUN: 50 mg/dL — AB (ref 6–20)
CALCIUM: 8.1 mg/dL — AB (ref 8.9–10.3)
CO2: 20 mmol/L — AB (ref 22–32)
Chloride: 114 mmol/L — ABNORMAL HIGH (ref 101–111)
Creatinine, Ser: 1.74 mg/dL — ABNORMAL HIGH (ref 0.61–1.24)
GFR calc Af Amer: 46 mL/min — ABNORMAL LOW (ref >60)
GFR, EST NON AFRICAN AMERICAN: 39 mL/min — AB (ref >60)
GLUCOSE: 123 mg/dL — AB (ref 65–99)
Potassium: 5.3 mmol/L — ABNORMAL HIGH (ref 3.5–5.1)
Sodium: 145 mmol/L (ref 135–145)

## 2015-11-03 LAB — CBC WITH DIFFERENTIAL/PLATELET
BASOS ABS: 0 10*3/uL (ref 0.0–0.1)
BASOS PCT: 0 %
Eosinophils Absolute: 0.1 10*3/uL (ref 0.0–0.7)
Eosinophils Relative: 1 %
HEMATOCRIT: 22.2 % — AB (ref 39.0–52.0)
HEMOGLOBIN: 7.2 g/dL — AB (ref 13.0–17.0)
LYMPHS PCT: 20 %
Lymphs Abs: 2.7 10*3/uL (ref 0.7–4.0)
MCH: 27 pg (ref 26.0–34.0)
MCHC: 32.4 g/dL (ref 30.0–36.0)
MCV: 83.1 fL (ref 78.0–100.0)
MONOS PCT: 6 %
Monocytes Absolute: 0.9 10*3/uL (ref 0.1–1.0)
NEUTROS ABS: 10 10*3/uL — AB (ref 1.7–7.7)
NEUTROS PCT: 73 %
Platelets: 274 10*3/uL (ref 150–400)
RBC: 2.67 MIL/uL — ABNORMAL LOW (ref 4.22–5.81)
RDW: 14.8 % (ref 11.5–15.5)
WBC: 13.7 10*3/uL — ABNORMAL HIGH (ref 4.0–10.5)

## 2015-11-03 LAB — GLUCOSE, CAPILLARY
GLUCOSE-CAPILLARY: 113 mg/dL — AB (ref 65–99)
GLUCOSE-CAPILLARY: 114 mg/dL — AB (ref 65–99)
GLUCOSE-CAPILLARY: 119 mg/dL — AB (ref 65–99)
GLUCOSE-CAPILLARY: 128 mg/dL — AB (ref 65–99)
GLUCOSE-CAPILLARY: 153 mg/dL — AB (ref 65–99)
GLUCOSE-CAPILLARY: 88 mg/dL (ref 65–99)
Glucose-Capillary: 93 mg/dL (ref 65–99)
Glucose-Capillary: 107 mg/dL — ABNORMAL HIGH (ref 65–99)

## 2015-11-03 LAB — ABO/RH: ABO/RH(D): O POS

## 2015-11-03 LAB — CBC
HEMATOCRIT: 24.6 % — AB (ref 39.0–52.0)
Hemoglobin: 8.3 g/dL — ABNORMAL LOW (ref 13.0–17.0)
MCH: 28 pg (ref 26.0–34.0)
MCHC: 33.7 g/dL (ref 30.0–36.0)
MCV: 83.1 fL (ref 78.0–100.0)
PLATELETS: 235 10*3/uL (ref 150–400)
RBC: 2.96 MIL/uL — ABNORMAL LOW (ref 4.22–5.81)
RDW: 15.1 % (ref 11.5–15.5)
WBC: 17 10*3/uL — AB (ref 4.0–10.5)

## 2015-11-03 LAB — APTT: APTT: 22 s — AB (ref 24–37)

## 2015-11-03 LAB — PROTIME-INR
INR: 1.26 (ref 0.00–1.49)
Prothrombin Time: 15.9 seconds — ABNORMAL HIGH (ref 11.6–15.2)

## 2015-11-03 LAB — PREPARE RBC (CROSSMATCH)

## 2015-11-03 SURGERY — EGD (ESOPHAGOGASTRODUODENOSCOPY)
Anesthesia: Monitor Anesthesia Care

## 2015-11-03 SURGERY — ESOPHAGOGASTRODUODENOSCOPY (EGD) WITH PROPOFOL
Anesthesia: General

## 2015-11-03 MED ORDER — FENTANYL CITRATE (PF) 250 MCG/5ML IJ SOLN
INTRAMUSCULAR | Status: AC
  Filled 2015-11-03: qty 5

## 2015-11-03 MED ORDER — ONDANSETRON HCL 4 MG/2ML IJ SOLN: 4.0000 mg | Freq: Once | INTRAMUSCULAR | Status: DC | PRN

## 2015-11-03 MED ORDER — PHENYLEPHRINE HCL 10 MG/ML IJ SOLN
INTRAMUSCULAR | Status: DC | PRN
  Administered 2015-11-03 (×2): 80 ug via INTRAVENOUS

## 2015-11-03 MED ORDER — MEPERIDINE HCL 100 MG/ML IJ SOLN: 6.2500 mg | INTRAMUSCULAR | Status: DC | PRN

## 2015-11-03 MED ORDER — FENTANYL CITRATE (PF) 100 MCG/2ML IJ SOLN
INTRAMUSCULAR | Status: DC | PRN
  Administered 2015-11-03: 100 ug via INTRAVENOUS

## 2015-11-03 MED ORDER — SUCCINYLCHOLINE CHLORIDE 20 MG/ML IJ SOLN
INTRAMUSCULAR | Status: DC | PRN
  Administered 2015-11-03: 120 mg via INTRAVENOUS

## 2015-11-03 MED ORDER — PROPOFOL 10 MG/ML IV BOLUS
INTRAVENOUS | Status: AC
  Filled 2015-11-03: qty 20

## 2015-11-03 MED ORDER — PHENYLEPHRINE HCL 10 MG/ML IJ SOLN
10.0000 mg | INTRAVENOUS | Status: DC | PRN
  Administered 2015-11-03: 100 ug/min via INTRAVENOUS

## 2015-11-03 MED ORDER — LACTATED RINGERS IV SOLN
INTRAVENOUS | Status: DC | PRN
  Administered 2015-11-03: 01:00:00 via INTRAVENOUS

## 2015-11-03 MED ORDER — LIDOCAINE HCL (CARDIAC) 20 MG/ML IV SOLN
INTRAVENOUS | Status: DC | PRN
Start: 2015-11-03 — End: 2015-11-03
  Administered 2015-11-03: 100 mg via INTRAVENOUS

## 2015-11-03 MED ORDER — HYDROMORPHONE HCL 1 MG/ML IJ SOLN: 0.2500 mg | INTRAMUSCULAR | Status: DC | PRN

## 2015-11-03 MED ORDER — PROPOFOL 10 MG/ML IV BOLUS
INTRAVENOUS | Status: DC | PRN
  Administered 2015-11-03: 120 mg via INTRAVENOUS

## 2015-11-03 NOTE — Anesthesia Postprocedure Evaluation (Signed)
Anesthesia Post Note  Patient: Lydia Guiles Wilkerson  Procedure(s) Performed: Procedure(s) (LRB): ESOPHAGOGASTRODUODENOSCOPY (EGD) WITH PROPOFOL (N/A)  Patient location during evaluation: PACU Anesthesia Type: General Level of consciousness: awake and alert Pain management: pain level controlled Vital Signs Assessment: post-procedure vital signs reviewed and stable Respiratory status: spontaneous breathing, nonlabored ventilation, respiratory function stable and patient connected to nasal cannula oxygen Cardiovascular status: blood pressure returned to baseline and stable Postop Assessment: no signs of nausea or vomiting Anesthetic complications: no    Last Vitals:  Filed Vitals:   11/03/15 0600 11/03/15 0630  BP: 98/61 103/67  Pulse: 87 88  Temp:    Resp: 15 21    Last Pain:  Filed Vitals:   11/03/15 0656  PainSc: 0-No pain                 Raeden Schippers DAVID

## 2015-11-03 NOTE — Op Note (Signed)
[facility] Patient Name: Sean Collins Procedure Date : 11/03/2015 MRN: [id number] Attending MD: Carlota Raspberry. Havery Moros , MD Date of Birth: [birth year] CSN: [id number] Age: 65 Admit Type: Inpatient Procedure:                Upper GI endoscopy Indications:              Active gastrointestinal bleeding, recent G-tube                            placement, hematemesis, blood per rectum Providers:                Carlota Raspberry. Havery Moros, MD, Carolynn Comment, RN,                            Cherylynn Ridges, Technician Referring MD:              Medicines:                Monitored Anesthesia Care Complications:            No immediate complications. Estimated blood loss:                            Minimal. Estimated Blood Loss:     Estimated blood loss: none. Estimated blood loss                            was minimal. Procedure:                Pre-Anesthesia Assessment:                           - Prior to the procedure, a History and Physical                            was performed, and patient medications and                            allergies were reviewed. The patient's tolerance of                            previous anesthesia was also reviewed. The risks                            and benefits of the procedure and the sedation                            options and risks were discussed with the patient.                            All questions were answered, and informed consent                            was obtained. Prior Anticoagulants: The patient has                            taken aspirin, last  dose was 1 day prior to                            procedure. ASA Grade Assessment: III - A patient                            with severe systemic disease. After reviewing the                            risks and benefits, the patient was deemed in                            satisfactory condition to undergo the procedure.                           After obtaining informed  consent, the endoscope was                            passed under direct vision. Throughout the                            procedure, the patient's blood pressure, pulse, and                            oxygen saturations were monitored continuously. The                            was introduced through the mouth, and advanced to                            the second part of duodenum. The upper GI endoscopy                            was accomplished without difficulty. The patient                            tolerated the procedure well. Scope In: Scope Out: Findings:      Esophagogastric landmarks were identified: the Z-line was found at 36       cm, the gastroesophageal junction was found at 36 cm and the upper       extent of the gastric folds was found at 36 cm from the incisors.      The exam of the esophagus was otherwise normal.      Red blood and clots were found in the gastric body, worst in the gastric       fundus and the fundus could not be cleared due to large clot burden.      There was evidence of an intact gastrostomy with a patent G-tube present       in the gastric body. This was characterized by a hemorrhagic appearance       with active bleeding noted, coming from behind the bumper. The G-tube       was loosened about 1cm at the abdominal wall side to allow visualization       from behind the bumper, in  which active bleeding was noted but without       being able to clearly see the vessel that was bleeding or where it was       arising from due to ongoing blood loss. The G-tube site was then       tightened an additional few cm's to tightly approximate the bumper to       the gastric wall to tamponade the bleeding. The bleeding appeared to       resolve after this intervention. The bumper site was observed for       several minutes and no further bleeding was appreciated.      Red blood was found in the duodenal bulb.      The second portion of the duodenum was  normal. Impression:               - Esophagogastric landmarks identified.                           - Red blood in the gastric body with blood clots                           - Intact gastrostomy with a patent G-tube present                            characterized by a hemorrhagic appearance and                            active bleeding as outlined above. The bleeding                            resolved with tightening the G-tube site as                            outlined above.                           - Blood in the duodenal bulb.                           - Normal second portion of the duodenum. Moderate Sedation:      No moderate sedation, case performed with MAC Recommendation:           - Return patient to ICU for ongoing care.                           - NPO.                           - Continue present medications, hold antiplatelet                            agents for now                           - Serial Hgb, monitor for recurrent bleeding                           -  Continue IV PPI                           - If the patient has recurrence of bleeding, please                            consult IR and general surgery for evaluation, as                            given the location of this bleeding, it may not be                            amenable to endoscopic intervention.                           - GI service will continue to follow this patient Procedure Code(s):        --- Professional ---                           [phone], Esophagogastroduodenoscopy, flexible,                            transoral; diagnostic, including collection of                            specimen(s) by brushing or washing, when performed                            (separate procedure) Diagnosis Code(s):        --- Professional ---                           K92.2, Gastrointestinal hemorrhage, unspecified                           Z93.1, Gastrostomy status CPT copyright 2016 American Medical  Association. All rights reserved. The codes documented in this report are preliminary and upon coder review may  be revised to meet current compliance requirements. Remo Lipps P. Liandro Thelin, MD 11/03/2015 2:07:06 AM This report has been signed electronically. Number of Addenda: 0

## 2015-11-03 NOTE — Progress Notes (Signed)
OT NOTE   11/03/15 0900  OT Visit Information  Last OT Received On 11/03/15  Assistance Needed +2  History of Present Illness 65 yo male presents with dysarthria. 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 He had associated lightheadedness and gait instability where he would "fade" to the left when trying to walk straight. MRI-Acute RIGHT MCA territory lenticulostriate infarct 11/01/15 PEG placement 6/28 EGD PMHx-CVA in September 2016 where MRI showed multiple acute tiny right posterior frontal lobe infarcts in the MCA distribution, multiple CVAs, Lt hip fx, HTN, HLD, diet controlled T2DM, and tobacco use  Precautions  Precautions Fall  Pain Assessment  Pain Assessment No/denies pain  Cognition  Arousal/Alertness Awake/alert  Behavior During Therapy Flat affect  Overall Cognitive Status Difficult to assess  Difficult to assess due to Impaired communication  ADL  Overall ADL's  Needs assistance/impaired  Eating/Feeding NPO  Grooming Oral care;Minimal assistance;Sitting  General ADL Comments pt able to static sit EOB with (A) and then completed sit <>stand iwth L LE blocked.   Bed Mobility  Overal bed mobility Needs Assistance  Bed Mobility Supine to Sit;Sit to Supine  Rolling Mod assist  Supine to sit +2 for physical assistance;Max assist  Sit to supine +2 for physical assistance;Mod assist  General bed mobility comments pt initiated with R side and head toward pillow. pt however with ataxic movement and flaccid L side  Balance  Overall balance assessment Needs assistance  Sitting-balance support Single extremity supported;Feet supported  Sitting balance-Leahy Scale Poor  Sitting balance - Comments posterior lean   Standing balance support Single extremity supported;During functional activity  Standing balance-Leahy Scale Poor  Standing balance comment L Le blocked  Transfers  Overall transfer level Needs assistance   Equipment used 2 person hand held assist  Transfers Sit to/from Stand  Sit to Stand +2 physical assistance;Max assist  General transfer comment pt able to initiate and power up into standing. Pt needs (A) to keep L LE in extension  OT - End of Session  Equipment Utilized During Treatment Gait belt  Activity Tolerance Patient tolerated treatment well  Patient left in bed;with call bell/phone within reach;with bed alarm set;with SCD's reapplied;with nursing/sitter in room  Nurse Communication Mobility status;Precautions  OT Assessment/Plan  OT Plan Discharge plan remains appropriate  OT Frequency (ACUTE ONLY) Min 2X/week  Follow Up Recommendations SNF;Supervision/Assistance - 24 hour  OT Equipment Other (comment) (defer to SNF)  OT Goal Progression  Progress towards OT goals Progressing toward goals (goals updated and continued if appropriate)  Acute Rehab OT Goals  Patient Stated Goal not stated due to communication deficits  OT Goal Formulation With patient  Time For Goal Achievement 11/17/15  Potential to Achieve Goals Good  ADL Goals  Pt Will Perform Grooming with min assist;sitting  Pt Will Perform Upper Body Bathing with min assist;sitting  Pt Will Perform Upper Body Dressing with min assist;sitting  Pt Will Perform Lower Body Dressing with mod assist;sit to/from stand  Pt Will Transfer to Toilet with mod assist;ambulating;bedside commode  Pt Will Perform Toileting - Clothing Manipulation and hygiene with mod assist;sit to/from stand  Additional ADL Goal #1 Pt will independently perform HEP for LUE to increase strength and coordination.  OT Time Calculation  OT Start Time (ACUTE ONLY) 0827  OT Stop Time (ACUTE ONLY) 0847  OT Time Calculation (min) 20 min  OT General Charges  $OT Visit 1 Procedure  OT  Treatments  $Self Care/Home Management  8-22 mins    Jeri Modena   OTR/L Pager: [phone] Office: [phone] .

## 2015-11-03 NOTE — Clinical Social Work Note (Addendum)
Clinical Social Work Assessment  Patient Details  Name: Sean Collins MRN: [id number] Date of Birth: 1950-05-23  Date of referral:  11/03/15               Reason for consult:  Facility Placement                Permission sought to share information with:  Family Supports Permission granted to share information::     Name::     Sean Collins (brother) [phone]  Agency::     Relationship::     Contact Information:     Housing/Transportation Living arrangements for the past 2 months:  Single Family Home Source of Information:  Other (Comment Required) (Information gathered from pt's brother Sean Collins [phone]) Patient Interpreter Needed:  None Criminal Activity/Legal Involvement Pertinent to Current Situation/Hospitalization:  No - Comment as needed Significant Relationships:  Siblings Lives with:  Self, Roommate Do you feel safe going back to the place where you live?  No (Unable to care for himself) Need for family participation in patient care:  Yes (Comment)  Care giving concerns: No concerns reported at this time.  Social Worker assessment / plan: Pt was admitted to Medical ICU for CVA and has been reccommended for SNF placement. CSW attempted to engage with pt at his beside but discovered that the pt is not currently oriented. CSW contacted pt's brother Sean Collins via telephone [phone]) to obtain information. CSW reviewed SNF bed offers with pt's brother. Brother states that he is currently leaning towards sending the pt to [place] but he would like some time to think about his options. Brother also stated that he would like pt to go to [place] in [place] if that was an option. Pt's brother informed CSW that pt has been living independently until recently getting a roommate a few moths ago. Pt's brother also reports that pt was in a relationship that ended about 2 months ago. Pt's brother expressed concerns about pt's prognosis and how  he will pay for his care. CSW offered emotional support. CSW will call pt's brother again tomorrow to check in on his decision about pt's SNF placement and will also follow up about a possible [facility] referral.  Employment status:  Disabled (Comment on whether or not currently receiving Disability) Insurance information:  Medicare PT Recommendations:   Not assessed at this time. Information / Referral to community resources:  [place]  Patient/Family's Response to care: Pt's family is appreciative of the care that the pt has recived and has no concerns at this time.  Patient/Family's Understanding of and Emotional Response to Diagnosis, Current Treatment, and Prognosis: Pt's family understands pt's diagnosis and current prognosis at this time.  Emotional Assessment Appearance:  Appears stated age Attitude/Demeanor/Rapport:   (Unable to assess because pt was not oriented. Information gathered from sibling via telephone.) Affect (typically observed):  Unable to Assess Orientation:  Fluctuating Orientation (Suspected and/or reported Sundowners) Alcohol / Substance use:   None reported  Psych involvement (Current and /or in the community):  No (Comment)  Discharge Needs  Concerns to be addressed:  No discharge needs identified Readmission within the last 30 days:  Yes (Previsous admission on 10/23/15 for slurred speech) Current discharge risk:  Dependent with Mobility Barriers to Discharge:  No Barriers Identified   Sean Collins, [place] 11/03/2015, 1:17 PM

## 2015-11-03 NOTE — Progress Notes (Signed)
Referring Physician(s): Nischal Dareen Piano  Supervising Physician: Jacqulynn Cadet  Patient Status:  Inpatient  Chief Complaint:  Dysphagia S/P Percutaneous gastrostomy tube by Dr. Pascal Lux 6/27/207  Subjective:  Patient sleeping. Did not wake up during my exam  Events noted. Patient developed bleeding from G-tube site last. EGD showed bleeding at bumper. It was pulled taught to tamponade, which was successful.  Allergies: Review of patient's allergies indicates no known allergies.  Medications: Prior to Admission medications   Medication Sig Start Date End Date Taking? Authorizing Provider  allopurinol (ZYLOPRIM) 300 MG tablet Take 300 mg by mouth daily.   Yes Historical Provider, MD  aspirin EC 81 MG tablet Take 81 mg by mouth daily.    Yes Historical Provider, MD  atenolol (TENORMIN) 50 MG tablet Take 1 tablet (50 mg total) by mouth 2 (two) times daily. 11/18/11  Yes [name], MD  atorvastatin (LIPITOR) 20 MG tablet Take 1 tablet (20 mg total) by mouth daily at 6 PM. 01/25/15  Yes Donne Hazel, MD  carboxymethylcellulose (REFRESH PLUS) 0.5 % SOLN Place 1 drop into both eyes 3 (three) times daily as needed (dry eyes).   Yes Historical Provider, MD  cholecalciferol (VITAMIN D) 1000 UNITS tablet Take 1,000 Units by mouth daily.   Yes Historical Provider, MD  clopidogrel (PLAVIX) 75 MG tablet Take 1 tablet (75 mg total) by mouth daily. 01/25/15  Yes Donne Hazel, MD  enalapril (VASOTEC) 20 MG tablet Take 1 tablet (20 mg total) by mouth daily. 11/18/11  Yes [name], MD  folic acid (FOLVITE) 1 MG tablet Take 1 mg by mouth daily.   Yes Historical Provider, MD  omeprazole (PRILOSEC) 20 MG capsule Take 20 mg by mouth daily.   Yes Historical Provider, MD  amLODipine (NORVASC) 10 MG tablet Take 1 tablet (10 mg total) by mouth daily. 10/25/15   Zada Finders, MD  aspirin EC 325 MG EC tablet Take 1 tablet (325 mg total) by mouth daily. 10/25/15   Zada Finders, MD  atorvastatin  (LIPITOR) 40 MG tablet Take 1 tablet (40 mg total) by mouth daily at 6 PM. 10/24/15   Zada Finders, MD     Vital Signs: BP 112/70 mmHg  Pulse 89  Temp(Src) 97.8 F (36.6 C) (Oral)  Resp 20  Ht 5\' 9"  (1.753 m)  Wt 164 lb 14.5 oz (74.8 kg)  BMI 24.34 kg/m2  SpO2 99%  Physical Exam  Asleep Gtube site with evidence of previous bleeding.  Dressing clean, nothing active seen. Abdomen soft, NTND  Imaging: Ct Abdomen Wo Contrast  11/03/2015  CLINICAL DATA:  Bleeding from peg tube insertion site. EXAM: CT ABDOMEN WITHOUT CONTRAST TECHNIQUE: Multidetector CT imaging of the abdomen was performed following the standard protocol without IV contrast. COMPARISON:  CT scan 10/27/2015 FINDINGS: Lower chest: The lung bases demonstrate dependent atelectasis/ edema. Stable peribronchial thickening and probable scarring changes. No pleural effusion. The heart is normal in size. Extensive coronary artery calcifications. Hepatobiliary: No focal hepatic lesions. The gallbladder appears normal. No common bile duct dilatation. Pancreas: No mass, inflammation or ductal dilatation. Spleen: Normal size.  No focal lesions. Adrenals/Urinary Tract: The adrenal glands and kidneys are grossly normal. Stomach/Bowel: The feeding tube is normally positioned in the antral region of the stomach. No complicating features are demonstrated. No surrounding fluid collections or hematoma. The transverse colon is normal. The small bowel is unremarkable. Vascular/Lymphatic: Advanced atherosclerotic calcifications involving the aorta and branch vessels. No aneurysm. IVC filter is  noted. No mesenteric or retroperitoneal mass or adenopathy. Other: No ascites or free air. Musculoskeletal: No significant bony findings. IMPRESSION: Peg tube appears to be in good position without complicating features. No acute abdominal findings. Electronically Signed   By: Marijo Sanes M.D.   On: 11/03/2015 12:05   Dg Abd 1 View  11/01/2015  CLINICAL DATA:   PEG tube placement EXAM: ABDOMEN - 1 VIEW COMPARISON:  10/29/2015 FINDINGS: Scattered contrast throughout nondistended colon. Feeding tube projects over third portion of duodenum. Nonobstructive bowel gas pattern. IVC filter noted. Scattered atherosclerotic calcifications including aorta. Osseous demineralization with LEFT hip prosthesis. IMPRESSION: Scattered contrast throughout colon. Aortic atherosclerosis. Electronically Signed   By: Lavonia Dana M.D.   On: 11/01/2015 07:54   Ir Gastrostomy Tube Mod Sed  11/02/2015  INDICATION: History of stroke, now with dysphagia. Request made for percutaneous gastrostomy tube placement for enteric nutrition supplementation. EXAM: PULL TROUGH GASTROSTOMY TUBE PLACEMENT COMPARISON:  Abdominal radiograph - 11/01/2015; CT abdomen pelvis - 10/27/2015 MEDICATIONS: Ancef 2 gm IV; Antibiotics were administered within 1 hour of the procedure. Glucagon 1 mg IV CONTRAST:  20 mL of Isovue 300 administered into the gastric lumen. ANESTHESIA/SEDATION: Moderate (conscious) sedation was employed during this procedure. A total of Fentanyl 100 mcg was administered intravenously. Moderate Sedation Time: 8 minutes. The patient's level of consciousness and vital signs were monitored continuously by radiology nursing throughout the procedure under my direct supervision. FLUOROSCOPY TIME:  2 minutes 48 seconds (15 mGy) COMPLICATIONS: None immediate. PROCEDURE: Informed written consent was obtained from the patient's family following explanation of the procedure, risks, benefits and alternatives. A time out was performed prior to the initiation of the procedure. Ultrasound scanning was performed to demarcate the edge of the left lobe of the liver. Maximal barrier sterile technique utilized including caps, mask, sterile gowns, sterile gloves, large sterile drape, hand hygiene and Betadine prep. The left upper quadrant was sterilely prepped and draped. An oral gastric catheter was inserted into the  stomach under fluoroscopy. The existing nasogastric feeding tube was removed. The left costal margin and air / barium opacified transverse colon were identified and avoided. Air was injected into the stomach for insufflation and visualization under fluoroscopy. Under sterile conditions a 17 gauge trocar needle was utilized to access the stomach percutaneously beneath the left subcostal margin after the overlying soft tissues were anesthetized with 1% Lidocaine with epinephrine. Needle position was confirmed within the stomach with aspiration of air and injection of small amount of contrast. A single T tack was deployed for gastropexy. Over an Amplatz guide wire, a 9-French sheath was inserted into the stomach. A snare device was utilized to capture the oral gastric catheter. The snare device was pulled retrograde from the stomach up the esophagus and out the oropharynx. The 20-French pull-through gastrostomy was connected to the snare device and pulled antegrade through the oropharynx down the esophagus into the stomach and then through the percutaneous tract external to the patient. The gastrostomy was assembled externally. Contrast injection confirms position in the stomach. Several spot radiographic images were obtained in various obliquities for documentation. The patient tolerated procedure well without immediate post procedural complication. FINDINGS: After successful fluoroscopic guided placement, the gastrostomy tube is appropriately positioned with internal disc against the ventral aspect of the gastric lumen. IMPRESSION: Successful fluoroscopic insertion of a 20-French pull-through gastrostomy tube. The gastrostomy may be used immediately for medication administration and in 24 hrs for the initiation of feeds. Electronically Signed   By: Sandi Mariscal  M.D.   On: 11/02/2015 10:27   Dg Abd Portable 1v  11/02/2015  CLINICAL DATA:  Abdominal pain. EXAM: PORTABLE ABDOMEN - 1 VIEW COMPARISON:  None. FINDINGS: A  percutaneous enteric tube projects over the left upper quadrant, likely entering the stomach. An IVC filter is identified. Contrast is seen in the colon, suggesting diverticulosis. No other acute abnormalities. IMPRESSION: No acute abnormalities. Electronically Signed   By: Dorise Bullion III M.D   On: 11/02/2015 23:52    Labs:  CBC:  Recent Labs  11/01/15 [id number] 11/02/15 0538 11/02/15 2305 11/03/15 0646  WBC 9.6 11.4* 14.9* 17.0*  HGB 12.8* 13.0 9.2* 8.3*  HCT 38.2* 39.7 28.9* 24.6*  PLT 264 291 255 235    COAGS:  Recent Labs  01/22/15 1757 10/23/15 1442  INR 1.01 1.11  APTT 28 29    BMP:  Recent Labs  10/31/15 0611 11/01/15 0635 11/02/15 0538 11/03/15 0646  NA 144 143 144 145  K 3.3* 3.5 4.2 5.3*  CL 112* 111 109 114*  CO2 26 26 25  20*  GLUCOSE 138* 137* 103* 123*  BUN 14 16 17  50*  CALCIUM 8.8* 9.1 9.5 8.1*  CREATININE 0.98 0.98 1.18 1.74*  GFRNONAA >60 >60 >60 39*  GFRAA >60 >60 >60 46*    LIVER FUNCTION TESTS:  Recent Labs  01/22/15 1757 10/23/15 1442  BILITOT 0.4 0.4  AST 24 23  ALT 16* 17  ALKPHOS 84 71  PROT 7.4 7.1  ALBUMIN 3.3* 3.4*    Assessment and Plan:  Dysphagia S/P gastrostomy tube by Dr. Pascal Lux yesterday. Post op bleeding seen on EGD - bumper secured tighter to tamponade  Recommend hold Lovenox x 24 hours.  Electronically Signed: Murrell Redden PA-C 11/03/2015, 12:28 PM   I spent a total of 15 Minutes at the the patient's bedside AND on the patient's hospital floor or unit, greater than 50% of which was counseling/coordinating care for f/u after gtube

## 2015-11-03 NOTE — Progress Notes (Signed)
Report & transfer to 2M08 via bed.  Patient alert, oriented, follows command.  Approx. 2115 vomited approx, 446ml clotted red blood & was incontinent of red stool (deeper red) approx 240 ml.  Abdomen tender beneath peg.  Peg dressing dry & unremarkable under disc.  Peg placed earlier today.  MDs x 3 notified and new orders received & initiated.  Attempts x 3 for 2nd IV unsuccessful by VAS Team & this RN.

## 2015-11-03 NOTE — H&P (View-Only) (Signed)
Consultation  Referring Provider:      Primary Care Physician:  Default, Provider, MD Primary Gastroenterologist:    NA     Reason for Consultation:     GI bleeding         HPI:   Sean Collins is a 65 y.o. male with PMH of multiple CVAs, HTN, HLD, diet controlled T2DM, and tobacco use who presented with dysarthria on 10/23/15, found to have acute R MCA infarct. He has been recovering in the hospital, on aspirin, plavix, and lovenox, who had a G-tube placed by IR earlier this morning. This evening around 9PM the nurse reported the patient was "covered in blood", reportedly vomited "2 cups of red blood with clots", and passed a large bowel movement with dark red stools with some black stool. He reportedly became hypotensive [id number] systolic with HR in the low 100s. Hgb showed a drop from 13.0 to 9.2 after symptoms started. He endorses some mild discomfort around the PEG site but has no significant tenderness on exam. History is limited by patient given inability to communicate.  Past Medical History  Diagnosis Date  . Hypertension   . Hyperlipidemia   . Stroke ([place])   . Diabetes mellitus without complication ([place])     History reviewed. No pertinent past surgical history.  Family History  Problem Relation Age of Onset  . Diabetes Brother     Social History  Substance Use Topics  . Smoking status: Current Every Day Smoker -- 0.50 packs/day for 32 years    Types: Cigarettes  . Smokeless tobacco: None     Comment: Started at age 39 though quit for 10 years at one point  . Alcohol Use: No    Prior to Admission medications   Medication Sig Start Date End Date Taking? Authorizing Provider  allopurinol (ZYLOPRIM) 300 MG tablet Take 300 mg by mouth daily.   Yes Historical Provider, MD  aspirin EC 81 MG tablet Take 81 mg by mouth daily.    Yes Historical Provider, MD  atenolol (TENORMIN) 50 MG tablet Take 1 tablet (50 mg total) by mouth 2 (two) times daily. 11/18/11  Yes [name], MD  atorvastatin (LIPITOR) 20 MG tablet Take 1 tablet (20 mg total) by mouth daily at 6 PM. 01/25/15  Yes Donne Hazel, MD  carboxymethylcellulose (REFRESH PLUS) 0.5 % SOLN Place 1 drop into both eyes 3 (three) times daily as needed (dry eyes).   Yes Historical Provider, MD  cholecalciferol (VITAMIN D) 1000 UNITS tablet Take 1,000 Units by mouth daily.   Yes Historical Provider, MD  clopidogrel (PLAVIX) 75 MG tablet Take 1 tablet (75 mg total) by mouth daily. 01/25/15  Yes Donne Hazel, MD  enalapril (VASOTEC) 20 MG tablet Take 1 tablet (20 mg total) by mouth daily. 11/18/11  Yes [name], MD  folic acid (FOLVITE) 1 MG tablet Take 1 mg by mouth daily.   Yes Historical Provider, MD  omeprazole (PRILOSEC) 20 MG capsule Take 20 mg by mouth daily.   Yes Historical Provider, MD  amLODipine (NORVASC) 10 MG tablet Take 1 tablet (10 mg total) by mouth daily. 10/25/15   Zada Finders, MD  aspirin EC 325 MG EC tablet Take 1 tablet (325 mg total) by mouth daily. 10/25/15   Zada Finders, MD  atorvastatin (LIPITOR) 40 MG tablet Take 1 tablet (40 mg total) by mouth daily at 6 PM. 10/24/15   Zada Finders, MD    Current  Facility-Administered Medications  Medication Dose Route Frequency Provider Last Rate Last Dose  . 0.9 %  sodium chloride infusion   Intravenous Continuous Shela Leff, MD 999 mL/hr at 11/02/15 2145    . acetaminophen (TYLENOL) suspension 650 mg  650 mg Per Tube Q6H PRN Zada Finders, MD      . allopurinol (ZYLOPRIM) tablet 300 mg  300 mg Per Tube Daily Zada Finders, MD      . antiseptic oral rinse (CPC / CETYLPYRIDINIUM CHLORIDE 0.05%) solution 7 mL  7 mL Mouth Rinse q12n4p Nischal Narendra, MD   7 mL at 11/02/15 1704  . atorvastatin (LIPITOR) tablet 40 mg  40 mg Per Tube q1800 Zada Finders, MD   40 mg at 11/02/15 1703  . chlorhexidine (PERIDEX) 0.12 % solution 15 mL  15 mL Mouth Rinse BID Nischal Narendra, MD   15 mL at 11/02/15 2257  . Chlorhexidine Gluconate Cloth 2 % PADS 6  each  6 each Topical Q0600 Aldine Contes, MD   6 each at 11/02/15 1147  . mupirocin ointment (BACTROBAN) 2 % 1 application  1 application Nasal BID Nischal Narendra, MD      . pantoprazole (PROTONIX) 80 mg in sodium chloride 0.9 % 250 mL (0.32 mg/mL) infusion  8 mg/hr Intravenous Continuous Shela Leff, MD      . potassium chloride 20 MEQ/15ML (10%) solution 40 mEq  40 mEq Per Tube Daily Zada Finders, MD   40 mEq at 11/02/15 1146  . sodium chloride 0.9 % bolus 1,000 mL  1,000 mL Intravenous Once Milagros Loll, MD        Allergies as of 10/23/2015  . (No Known Allergies)     Review of Systems:    As per HPI, otherwise negative    Physical Exam:  Vital signs in last 24 hours: Temp:  [98 F (36.7 C)-99.5 F (37.5 C)] 98 F (36.7 C) (06/27 2300) Pulse Rate:  [61-108] 88 (06/27 2300) Resp:  [16-26] 20 (06/27 2300) BP: (88-175)/(51-95) 102/55 mmHg (06/27 2300) SpO2:  [95 %-100 %] 99 % (06/27 2300) Last BM Date: 11/02/15 General:   Pleasant male resting in bed, unable to speak but nods yes and no Head:  Normocephalic and atraumatic. Eyes:   No icterus.   Neck:  Supple Lungs: Respirations even and unlabored. Lungs relatively clear to auscultation bilaterally.   Heart:  Mildly tachycardic Regular rate and rhythm;  Abdomen:  Soft, nondistended, nontender. Gastrostomy tube site shows no evidence of bleeding at the site, some old blood noted. Normal bowel sounds. No appreciable masses or hepatomegaly.  Rectal:  Not performed. .  Extremities:  Without edema.  LAB RESULTS:  Recent Labs  11/01/15 0635 11/02/15 0538 11/02/15 2305  WBC 9.6 11.4* 14.9*  HGB 12.8* 13.0 9.2*  HCT 38.2* 39.7 28.9*  PLT 264 291 255   BMET  Recent Labs  10/31/15 0611 11/01/15 0635 11/02/15 0538  NA 144 143 144  K 3.3* 3.5 4.2  CL 112* 111 109  CO2 26 26 25   GLUCOSE 138* 137* 103*  BUN 14 16 17   CREATININE 0.98 0.98 1.18  CALCIUM 8.8* 9.1 9.5   LFT No results for input(s): PROT,  ALBUMIN, AST, ALT, ALKPHOS, BILITOT, BILIDIR, IBILI in the last 72 hours. PT/INR No results for input(s): LABPROT, INR in the last 72 hours.  STUDIES: Dg Abd 1 View  11/01/2015  CLINICAL DATA:  PEG tube placement EXAM: ABDOMEN - 1 VIEW COMPARISON:  10/29/2015 FINDINGS: Scattered contrast throughout nondistended  colon. Feeding tube projects over third portion of duodenum. Nonobstructive bowel gas pattern. IVC filter noted. Scattered atherosclerotic calcifications including aorta. Osseous demineralization with LEFT hip prosthesis. IMPRESSION: Scattered contrast throughout colon. Aortic atherosclerosis. Electronically Signed   By: Lavonia Dana M.D.   On: 11/01/2015 07:54   Ir Gastrostomy Tube Mod Sed  11/02/2015  INDICATION: History of stroke, now with dysphagia. Request made for percutaneous gastrostomy tube placement for enteric nutrition supplementation. EXAM: PULL TROUGH GASTROSTOMY TUBE PLACEMENT COMPARISON:  Abdominal radiograph - 11/01/2015; CT abdomen pelvis - 10/27/2015 MEDICATIONS: Ancef 2 gm IV; Antibiotics were administered within 1 hour of the procedure. Glucagon 1 mg IV CONTRAST:  20 mL of Isovue 300 administered into the gastric lumen. ANESTHESIA/SEDATION: Moderate (conscious) sedation was employed during this procedure. A total of Fentanyl 100 mcg was administered intravenously. Moderate Sedation Time: 8 minutes. The patient's level of consciousness and vital signs were monitored continuously by radiology nursing throughout the procedure under my direct supervision. FLUOROSCOPY TIME:  2 minutes 48 seconds (15 mGy) COMPLICATIONS: None immediate. PROCEDURE: Informed written consent was obtained from the patient's family following explanation of the procedure, risks, benefits and alternatives. A time out was performed prior to the initiation of the procedure. Ultrasound scanning was performed to demarcate the edge of the left lobe of the liver. Maximal barrier sterile technique utilized including  caps, mask, sterile gowns, sterile gloves, large sterile drape, hand hygiene and Betadine prep. The left upper quadrant was sterilely prepped and draped. An oral gastric catheter was inserted into the stomach under fluoroscopy. The existing nasogastric feeding tube was removed. The left costal margin and air / barium opacified transverse colon were identified and avoided. Air was injected into the stomach for insufflation and visualization under fluoroscopy. Under sterile conditions a 17 gauge trocar needle was utilized to access the stomach percutaneously beneath the left subcostal margin after the overlying soft tissues were anesthetized with 1% Lidocaine with epinephrine. Needle position was confirmed within the stomach with aspiration of air and injection of small amount of contrast. A single T tack was deployed for gastropexy. Over an Amplatz guide wire, a 9-French sheath was inserted into the stomach. A snare device was utilized to capture the oral gastric catheter. The snare device was pulled retrograde from the stomach up the esophagus and out the oropharynx. The 20-French pull-through gastrostomy was connected to the snare device and pulled antegrade through the oropharynx down the esophagus into the stomach and then through the percutaneous tract external to the patient. The gastrostomy was assembled externally. Contrast injection confirms position in the stomach. Several spot radiographic images were obtained in various obliquities for documentation. The patient tolerated procedure well without immediate post procedural complication. FINDINGS: After successful fluoroscopic guided placement, the gastrostomy tube is appropriately positioned with internal disc against the ventral aspect of the gastric lumen. IMPRESSION: Successful fluoroscopic insertion of a 20-French pull-through gastrostomy tube. The gastrostomy may be used immediately for medication administration and in 24 hrs for the initiation of  feeds. Electronically Signed   By: Sandi Mariscal M.D.   On: 11/02/2015 10:27   Dg Abd Portable 1v  11/02/2015  CLINICAL DATA:  Abdominal pain. EXAM: PORTABLE ABDOMEN - 1 VIEW COMPARISON:  None. FINDINGS: A percutaneous enteric tube projects over the left upper quadrant, likely entering the stomach. An IVC filter is identified. Contrast is seen in the colon, suggesting diverticulosis. No other acute abnormalities. IMPRESSION: No acute abnormalities. Electronically Signed   By: Dorise Bullion III M.D  On: 11/02/2015 23:52     PREVIOUS ENDOSCOPIES:               Impression / Plan:   65 y/o male with history of multiple CVAs, admitted with R MCA infarct, on multiple antiplatelet agents, who had a G-tube placed earlier today per IR. This evening nursing staff reported 2 large episodes of hematemesis, along with red blood per rectum, indicating active significant upper GI bleeding. Hgb dropped almost 4 points from his level this morning. Abdomen is soft, xray does not show any evidence of perforation. He has responded to IVF hydration thus far and is hemodynamically stable, but I am concerned about his significant blood loss over a short period of time following his G tube placement, which I suspect is the cause of his bleeding. I have spoke with Dr. Kathlene Cote of IR about his case, as bleeding like this following G tube placement is rare. I offered the patient an endoscopy this evening given his significant blood loss to help identify and treat underlying the pathology. If we cannot stop the bleeding endoscopically and it persists will discuss with IR again and obtain surgical consultation. I explained the procedure to the patient who seemed to understand by nodding his head although I also spoke with his brother Patrick Jupiter on the phone who agreed with proceeding after discussing the risks / benefits. I have discussed his case with anesthesia who will sedate the patient, and may intubate the patient if significant  volume of blood is noted in the stomach. Further recommendations pending exam findings.  Anthonyville Cellar, MD [facility] Pager [phone]

## 2015-11-03 NOTE — Consult Note (Addendum)
Consultation  Referring Provider:      Primary Care Physician:  Default, Provider, MD Primary Gastroenterologist:    NA     Reason for Consultation:     GI bleeding         HPI:   Sean Collins is a 65 y.o. male with PMH of multiple CVAs, HTN, HLD, diet controlled T2DM, and tobacco use who presented with dysarthria on 10/23/15, found to have acute R MCA infarct. He has been recovering in the hospital, on aspirin, plavix (held for 5 days), who had a G-tube placed by IR earlier this morning. This evening around 9PM the nurse reported the patient was "covered in blood", reportedly vomited "2 cups of red blood with clots", and passed a large bowel movement with dark red stools with some black stool. He reportedly became hypotensive [id number] systolic with HR in the low 100s. Hgb showed a drop from 13.0 to 9.2 after symptoms started. He endorses some mild discomfort around the PEG site but has no significant tenderness on exam. History is limited by patient given inability to communicate.  Past Medical History  Diagnosis Date  . Hypertension   . Hyperlipidemia   . Stroke ([place])   . Diabetes mellitus without complication ([place])     History reviewed. No pertinent past surgical history.  Family History  Problem Relation Age of Onset  . Diabetes Brother     Social History  Substance Use Topics  . Smoking status: Current Every Day Smoker -- 0.50 packs/day for 32 years    Types: Cigarettes  . Smokeless tobacco: None     Comment: Started at age 36 though quit for 10 years at one point  . Alcohol Use: No    Prior to Admission medications   Medication Sig Start Date End Date Taking? Authorizing Provider  allopurinol (ZYLOPRIM) 300 MG tablet Take 300 mg by mouth daily.   Yes Historical Provider, MD  aspirin EC 81 MG tablet Take 81 mg by mouth daily.    Yes Historical Provider, MD  atenolol (TENORMIN) 50 MG tablet Take 1 tablet (50 mg total) by mouth 2 (two) times daily. 11/18/11  Yes  [name], MD  atorvastatin (LIPITOR) 20 MG tablet Take 1 tablet (20 mg total) by mouth daily at 6 PM. 01/25/15  Yes Donne Hazel, MD  carboxymethylcellulose (REFRESH PLUS) 0.5 % SOLN Place 1 drop into both eyes 3 (three) times daily as needed (dry eyes).   Yes Historical Provider, MD  cholecalciferol (VITAMIN D) 1000 UNITS tablet Take 1,000 Units by mouth daily.   Yes Historical Provider, MD  clopidogrel (PLAVIX) 75 MG tablet Take 1 tablet (75 mg total) by mouth daily. 01/25/15  Yes Donne Hazel, MD  enalapril (VASOTEC) 20 MG tablet Take 1 tablet (20 mg total) by mouth daily. 11/18/11  Yes [name], MD  folic acid (FOLVITE) 1 MG tablet Take 1 mg by mouth daily.   Yes Historical Provider, MD  omeprazole (PRILOSEC) 20 MG capsule Take 20 mg by mouth daily.   Yes Historical Provider, MD  amLODipine (NORVASC) 10 MG tablet Take 1 tablet (10 mg total) by mouth daily. 10/25/15   Zada Finders, MD  aspirin EC 325 MG EC tablet Take 1 tablet (325 mg total) by mouth daily. 10/25/15   Zada Finders, MD  atorvastatin (LIPITOR) 40 MG tablet Take 1 tablet (40 mg total) by mouth daily at 6 PM. 10/24/15   Zada Finders, MD  Current Facility-Administered Medications  Medication Dose Route Frequency Provider Last Rate Last Dose  . 0.9 %  sodium chloride infusion   Intravenous Continuous Shela Leff, MD 999 mL/hr at 11/02/15 2145    . acetaminophen (TYLENOL) suspension 650 mg  650 mg Per Tube Q6H PRN Zada Finders, MD      . allopurinol (ZYLOPRIM) tablet 300 mg  300 mg Per Tube Daily Zada Finders, MD      . antiseptic oral rinse (CPC / CETYLPYRIDINIUM CHLORIDE 0.05%) solution 7 mL  7 mL Mouth Rinse q12n4p Nischal Narendra, MD   7 mL at 11/02/15 1704  . atorvastatin (LIPITOR) tablet 40 mg  40 mg Per Tube q1800 Zada Finders, MD   40 mg at 11/02/15 1703  . chlorhexidine (PERIDEX) 0.12 % solution 15 mL  15 mL Mouth Rinse BID Nischal Narendra, MD   15 mL at 11/02/15 2257  . Chlorhexidine Gluconate Cloth 2 %  PADS 6 each  6 each Topical Q0600 Aldine Contes, MD   6 each at 11/02/15 1147  . mupirocin ointment (BACTROBAN) 2 % 1 application  1 application Nasal BID Nischal Narendra, MD      . pantoprazole (PROTONIX) 80 mg in sodium chloride 0.9 % 250 mL (0.32 mg/mL) infusion  8 mg/hr Intravenous Continuous Shela Leff, MD      . potassium chloride 20 MEQ/15ML (10%) solution 40 mEq  40 mEq Per Tube Daily Zada Finders, MD   40 mEq at 11/02/15 1146  . sodium chloride 0.9 % bolus 1,000 mL  1,000 mL Intravenous Once Milagros Loll, MD        Allergies as of 10/23/2015  . (No Known Allergies)     Review of Systems:    As per HPI, otherwise negative    Physical Exam:  Vital signs in last 24 hours: Temp:  [98 F (36.7 C)-99.5 F (37.5 C)] 98 F (36.7 C) (06/27 2300) Pulse Rate:  [61-108] 88 (06/27 2300) Resp:  [16-26] 20 (06/27 2300) BP: (88-175)/(51-95) 102/55 mmHg (06/27 2300) SpO2:  [95 %-100 %] 99 % (06/27 2300) Last BM Date: 11/02/15 General:   Pleasant male resting in bed, unable to speak but nods yes and no Head:  Normocephalic and atraumatic. Eyes:   No icterus.   Neck:  Supple Lungs: Respirations even and unlabored. Lungs relatively clear to auscultation bilaterally.   Heart:  Mildly tachycardic Regular rate and rhythm;  Abdomen:  Soft, nondistended, nontender. Gastrostomy tube site shows no evidence of bleeding at the site, some old blood noted. Normal bowel sounds. No appreciable masses or hepatomegaly.  Rectal:  Not performed. .  Extremities:  Without edema.  LAB RESULTS:  Recent Labs  11/01/15 0635 11/02/15 0538 11/02/15 2305  WBC 9.6 11.4* 14.9*  HGB 12.8* 13.0 9.2*  HCT 38.2* 39.7 28.9*  PLT 264 291 255   BMET  Recent Labs  10/31/15 0611 11/01/15 0635 11/02/15 0538  NA 144 143 144  K 3.3* 3.5 4.2  CL 112* 111 109  CO2 26 26 25   GLUCOSE 138* 137* 103*  BUN 14 16 17   CREATININE 0.98 0.98 1.18  CALCIUM 8.8* 9.1 9.5   LFT No results for input(s):  PROT, ALBUMIN, AST, ALT, ALKPHOS, BILITOT, BILIDIR, IBILI in the last 72 hours. PT/INR No results for input(s): LABPROT, INR in the last 72 hours.  STUDIES: Dg Abd 1 View  11/01/2015  CLINICAL DATA:  PEG tube placement EXAM: ABDOMEN - 1 VIEW COMPARISON:  10/29/2015 FINDINGS: Scattered contrast throughout  nondistended colon. Feeding tube projects over third portion of duodenum. Nonobstructive bowel gas pattern. IVC filter noted. Scattered atherosclerotic calcifications including aorta. Osseous demineralization with LEFT hip prosthesis. IMPRESSION: Scattered contrast throughout colon. Aortic atherosclerosis. Electronically Signed   By: Lavonia Dana M.D.   On: 11/01/2015 07:54   Ir Gastrostomy Tube Mod Sed  11/02/2015  INDICATION: History of stroke, now with dysphagia. Request made for percutaneous gastrostomy tube placement for enteric nutrition supplementation. EXAM: PULL TROUGH GASTROSTOMY TUBE PLACEMENT COMPARISON:  Abdominal radiograph - 11/01/2015; CT abdomen pelvis - 10/27/2015 MEDICATIONS: Ancef 2 gm IV; Antibiotics were administered within 1 hour of the procedure. Glucagon 1 mg IV CONTRAST:  20 mL of Isovue 300 administered into the gastric lumen. ANESTHESIA/SEDATION: Moderate (conscious) sedation was employed during this procedure. A total of Fentanyl 100 mcg was administered intravenously. Moderate Sedation Time: 8 minutes. The patient's level of consciousness and vital signs were monitored continuously by radiology nursing throughout the procedure under my direct supervision. FLUOROSCOPY TIME:  2 minutes 48 seconds (15 mGy) COMPLICATIONS: None immediate. PROCEDURE: Informed written consent was obtained from the patient's family following explanation of the procedure, risks, benefits and alternatives. A time out was performed prior to the initiation of the procedure. Ultrasound scanning was performed to demarcate the edge of the left lobe of the liver. Maximal barrier sterile technique utilized  including caps, mask, sterile gowns, sterile gloves, large sterile drape, hand hygiene and Betadine prep. The left upper quadrant was sterilely prepped and draped. An oral gastric catheter was inserted into the stomach under fluoroscopy. The existing nasogastric feeding tube was removed. The left costal margin and air / barium opacified transverse colon were identified and avoided. Air was injected into the stomach for insufflation and visualization under fluoroscopy. Under sterile conditions a 17 gauge trocar needle was utilized to access the stomach percutaneously beneath the left subcostal margin after the overlying soft tissues were anesthetized with 1% Lidocaine with epinephrine. Needle position was confirmed within the stomach with aspiration of air and injection of small amount of contrast. A single T tack was deployed for gastropexy. Over an Amplatz guide wire, a 9-French sheath was inserted into the stomach. A snare device was utilized to capture the oral gastric catheter. The snare device was pulled retrograde from the stomach up the esophagus and out the oropharynx. The 20-French pull-through gastrostomy was connected to the snare device and pulled antegrade through the oropharynx down the esophagus into the stomach and then through the percutaneous tract external to the patient. The gastrostomy was assembled externally. Contrast injection confirms position in the stomach. Several spot radiographic images were obtained in various obliquities for documentation. The patient tolerated procedure well without immediate post procedural complication. FINDINGS: After successful fluoroscopic guided placement, the gastrostomy tube is appropriately positioned with internal disc against the ventral aspect of the gastric lumen. IMPRESSION: Successful fluoroscopic insertion of a 20-French pull-through gastrostomy tube. The gastrostomy may be used immediately for medication administration and in 24 hrs for the  initiation of feeds. Electronically Signed   By: Sandi Mariscal M.D.   On: 11/02/2015 10:27   Dg Abd Portable 1v  11/02/2015  CLINICAL DATA:  Abdominal pain. EXAM: PORTABLE ABDOMEN - 1 VIEW COMPARISON:  None. FINDINGS: A percutaneous enteric tube projects over the left upper quadrant, likely entering the stomach. An IVC filter is identified. Contrast is seen in the colon, suggesting diverticulosis. No other acute abnormalities. IMPRESSION: No acute abnormalities. Electronically Signed   By: Dorise Bullion III M.D  On: 11/02/2015 23:52     PREVIOUS ENDOSCOPIES:               Impression / Plan:   65 y/o male with history of multiple CVAs, admitted with R MCA infarct, on multiple antiplatelet agents (although plavix held for past 5 days), who had a G-tube placed earlier today per IR. This evening nursing staff reported 2 large episodes of hematemesis, along with red blood per rectum, indicating active significant upper GI bleeding. Hgb dropped almost 4 points from his level this morning. Abdomen is soft, xray does not show any evidence of perforation. He has responded to IVF hydration thus far and is hemodynamically stable, but I am concerned about his significant blood loss over a short period of time following his G tube placement, which I suspect is the cause of his bleeding. I have spoke with Dr. Kathlene Cote of IR about his case, as bleeding like this following G tube placement is rare. I offered the patient an endoscopy this evening given his significant blood loss to help identify and treat underlying the pathology. If we cannot stop the bleeding endoscopically and it persists will discuss with IR again and obtain surgical consultation. I explained the procedure to the patient who seemed to understand by nodding his head although I also spoke with his brother Sean Collins on the phone who agreed with proceeding after discussing the risks / benefits. I have discussed his case with anesthesia who will sedate the  patient, and may intubate the patient if significant volume of blood is noted in the stomach. Further recommendations pending exam findings.  Wallace Cellar, MD [facility] Gastroenterology Pager [phone]

## 2015-11-03 NOTE — Transfer of Care (Signed)
Immediate Anesthesia Transfer of Care Note  Patient: Sean Collins  Procedure(s) Performed: Procedure(s): ESOPHAGOGASTRODUODENOSCOPY (EGD) WITH PROPOFOL (N/A)  Patient Location: PACU  Anesthesia Type:General  Level of Consciousness: awake  Airway & Oxygen Therapy: Patient Spontanous Breathing and Patient connected to face mask oxygen  Post-op Assessment: Report given to RN and Post -op Vital signs reviewed and stable  Post vital signs: Reviewed and stable  Last Vitals:  Filed Vitals:   11/03/15 0000 11/03/15 0047  BP: 114/62 115/64  Pulse: 88 89  Temp:  36.4 C  Resp: 25 22    Last Pain:  Filed Vitals:   11/03/15 0142  PainSc: 0-No pain         Complications: No apparent anesthesia complications

## 2015-11-03 NOTE — Interval H&P Note (Signed)
History and Physical Interval Note:  11/03/2015 12:45 AM  Sean Collins  has presented today for surgery, with the diagnosis of GI bleeding  The various methods of treatment have been discussed with the patient and family. After consideration of risks, benefits and other options for treatment, the patient has consented to  Procedure(s): ESOPHAGOGASTRODUODENOSCOPY (EGD) WITH PROPOFOL (N/A) as a surgical intervention .  The patient's history has been reviewed, patient examined, no change in status, stable for surgery.  I have reviewed the patient's chart and labs.  Questions were answered to the patient's satisfaction.     [name]

## 2015-11-03 NOTE — Anesthesia Procedure Notes (Signed)
Procedure Name: Intubation Date/Time: 11/03/2015 1:05 AM Performed by: Hollie Salk Z Pre-anesthesia Checklist: Patient identified, Emergency Drugs available, Suction available, Patient being monitored and Timeout performed Patient Re-evaluated:Patient Re-evaluated prior to inductionOxygen Delivery Method: Circle system utilized Preoxygenation: Pre-oxygenation with 100% oxygen Intubation Type: IV induction and Rapid sequence Laryngoscope Size: Glidescope Grade View: Grade I Tube type: Oral Tube size: 7.5 mm Number of attempts: 1 Airway Equipment and Method: Stylet Placement Confirmation: positive ETCO2 and breath sounds checked- equal and bilateral Secured at: 23 cm Tube secured with: Tape Dental Injury: Teeth and Oropharynx as per pre-operative assessment

## 2015-11-03 NOTE — Progress Notes (Addendum)
PULMONARY / CRITICAL CARE MEDICINE   Name: Sean Collins MRN: [id number] DOB: 08-08-1950    ADMISSION DATE:  10/23/2015 CONSULTATION DATE:  11/02/15  REFERRING MD:  Dareen Piano ([facility])  CHIEF COMPLAINT:  GI Bleed  HISTORY OF PRESENT ILLNESS:  Pt is dysarthric; therefore, this HPI is obtained from chart review. Sean Collins is a 65 y.o. male with PMH including but not limited to previous CVA's in 2016 thought to be secondary to atherosclerosis.  He was admitted 10/23/15 with acute right MCA infarct.  He was also found to have right ICA stenosis (40-59%).  He had worsening deficits following admission and repeat MRI on 6/19 showed evolution of initial stroke with no new infarct.  VVS were initially planning on CEA prior to discharge; however, due to his ongoing neuro deficits, decided to postpone this to an outpatient procedure once he has recovered.  He had problems with dysphagia and suspected aspiration PNA; therefore, PEG tube ended up being placed on morning of 11/02/15 (placed by IR).  Later that evening, pt had vomiting episode consisting of 2 cups of bright red blood as well as a BM consisting of 3/4 cup of dark colored stools.  He had associated borderline BP's during these episodes and complained of abdominal pain.  IR was contacted by primary team and they felt that bleed was likely venous and did not require emergent arteriography.  They recommended transfer to ICU.  PCCM was subsequently called in consultation.  GI was also contacted by primary team and they will plan to see pt tonight.  SUBJECTIVE: Patient underwent upper endoscopy overnight. No other acute events. But overnight nurse reported maroon-colored stools.  REVIEW OF SYSTEMS:  Unable to obtain given aphasia.  VITAL SIGNS: BP 103/67 mmHg  Pulse 88  Temp(Src) 98.2 F (36.8 C) (Oral)  Resp 21  Ht 5\' 9"  (1.753 m)  Wt 74.8 kg (164 lb 14.5 oz)  BMI 24.34 kg/m2  SpO2 97%  HEMODYNAMICS:    VENTILATOR  SETTINGS:    INTAKE / OUTPUT: I/O last 3 completed shifts: In: [id number] [I.V.:1275] Out: 922 [Urine:920; Blood:2]   PHYSICAL EXAMINATION: General: No distress. Awake. Neuro: Apparent receptive and expressive aphasia. HEENT: No scleral icterus or injection. Moist mucous membranes. Cardiovascular: Regular rate. No JVD. Lungs: Clear bilaterally to auscultation. Normal work of breathing. Abdomen: Soft. Nondistended. PEG tube in place. Musculoskeletal: No joint deformity or effusion appreciated.  Skin: Warm and dry. No rash on exposed skin.  LABS:  BMET  Recent Labs Lab 11/01/15 0635 11/02/15 0538 11/03/15 0646  NA 143 144 145  K 3.5 4.2 5.3*  CL 111 109 114*  CO2 26 25 20*  BUN 16 17 50*  CREATININE 0.98 1.18 1.74*  GLUCOSE 137* 103* 123*    Electrolytes  Recent Labs Lab 10/30/15 0628  11/01/15 0635 11/02/15 0538 11/03/15 0646  CALCIUM 9.3  < > 9.1 9.5 8.1*  MG 1.7  --   --   --   --   < > = values in this interval not displayed.  CBC  Recent Labs Lab 11/02/15 0538 11/02/15 2305 11/03/15 0646  WBC 11.4* 14.9* 17.0*  HGB 13.0 9.2* 8.3*  HCT 39.7 28.9* 24.6*  PLT 291 255 235    Coag's No results for input(s): APTT, INR in the last 168 hours.  Sepsis Markers No results for input(s): LATICACIDVEN, PROCALCITON, O2SATVEN in the last 168 hours.  ABG No results for input(s): PHART, PCO2ART, PO2ART in the last 168 hours.  Liver  Enzymes No results for input(s): AST, ALT, ALKPHOS, BILITOT, ALBUMIN in the last 168 hours.  Cardiac Enzymes No results for input(s): TROPONINI, PROBNP in the last 168 hours.  Glucose  Recent Labs Lab 11/02/15 1636 11/02/15 2007 11/02/15 2151 11/03/15 0056 11/03/15 0152 11/03/15 0359  GLUCAP 91 122* 150* 119* 153* 128*    Imaging Ir Gastrostomy Tube Mod Sed  11/02/2015  INDICATION: History of stroke, now with dysphagia. Request made for percutaneous gastrostomy tube placement for enteric nutrition supplementation.  EXAM: PULL TROUGH GASTROSTOMY TUBE PLACEMENT COMPARISON:  Abdominal radiograph - 11/01/2015; CT abdomen pelvis - 10/27/2015 MEDICATIONS: Ancef 2 gm IV; Antibiotics were administered within 1 hour of the procedure. Glucagon 1 mg IV CONTRAST:  20 mL of Isovue 300 administered into the gastric lumen. ANESTHESIA/SEDATION: Moderate (conscious) sedation was employed during this procedure. A total of Fentanyl 100 mcg was administered intravenously. Moderate Sedation Time: 8 minutes. The patient's level of consciousness and vital signs were monitored continuously by radiology nursing throughout the procedure under my direct supervision. FLUOROSCOPY TIME:  2 minutes 48 seconds (15 mGy) COMPLICATIONS: None immediate. PROCEDURE: Informed written consent was obtained from the patient's family following explanation of the procedure, risks, benefits and alternatives. A time out was performed prior to the initiation of the procedure. Ultrasound scanning was performed to demarcate the edge of the left lobe of the liver. Maximal barrier sterile technique utilized including caps, mask, sterile gowns, sterile gloves, large sterile drape, hand hygiene and Betadine prep. The left upper quadrant was sterilely prepped and draped. An oral gastric catheter was inserted into the stomach under fluoroscopy. The existing nasogastric feeding tube was removed. The left costal margin and air / barium opacified transverse colon were identified and avoided. Air was injected into the stomach for insufflation and visualization under fluoroscopy. Under sterile conditions a 17 gauge trocar needle was utilized to access the stomach percutaneously beneath the left subcostal margin after the overlying soft tissues were anesthetized with 1% Lidocaine with epinephrine. Needle position was confirmed within the stomach with aspiration of air and injection of small amount of contrast. A single T tack was deployed for gastropexy. Over an Amplatz guide wire, a  9-French sheath was inserted into the stomach. A snare device was utilized to capture the oral gastric catheter. The snare device was pulled retrograde from the stomach up the esophagus and out the oropharynx. The 20-French pull-through gastrostomy was connected to the snare device and pulled antegrade through the oropharynx down the esophagus into the stomach and then through the percutaneous tract external to the patient. The gastrostomy was assembled externally. Contrast injection confirms position in the stomach. Several spot radiographic images were obtained in various obliquities for documentation. The patient tolerated procedure well without immediate post procedural complication. FINDINGS: After successful fluoroscopic guided placement, the gastrostomy tube is appropriately positioned with internal disc against the ventral aspect of the gastric lumen. IMPRESSION: Successful fluoroscopic insertion of a 20-French pull-through gastrostomy tube. The gastrostomy may be used immediately for medication administration and in 24 hrs for the initiation of feeds. Electronically Signed   By: Sandi Mariscal M.D.   On: 11/02/2015 10:27   Dg Abd Portable 1v  11/02/2015  CLINICAL DATA:  Abdominal pain. EXAM: PORTABLE ABDOMEN - 1 VIEW COMPARISON:  None. FINDINGS: A percutaneous enteric tube projects over the left upper quadrant, likely entering the stomach. An IVC filter is identified. Contrast is seen in the colon, suggesting diverticulosis. No other acute abnormalities. IMPRESSION: No acute abnormalities. Electronically Signed  By: Dorise Bullion III M.D   On: 11/02/2015 23:52     STUDIES:  MRI brain 6/17 > acute right MCA infarct, nonhemorrhagic. MRI brain 6/19 > interval enlargement of right posterior corona radiata / posterior right lenticular nucleus nonhemorrhagic infarct. CTA neck 6/18 > 50% stenosis at origin of right ICA and 50% stenosis distally.  40% stenosis of left ICA. Echo 6/18 > EF 55-60%.  PA peak  55.  CULTURES: MRSA PCR 6/27:  Positive   ANTIBIOTICS: Ancef 6/27 > 6/27   SIGNIFICANT EVENTS: 6/17 > admit. 6/27 > PEG placed.  Later developed UGIB so transferred to ICU. 6/28 > EGD [place]  LINES/TUBES: PEG 6/27 >> PIV x1  ASSESSMENT / PLAN:  GASTROINTESTINAL A:   UGIB - From PEG tube insertion site. Dysphagia - s/p PEG 6/27. GI prophylaxis. Nutrition. P:   Continue PPI gtt. Continue NPO. Resume TF when ok with GI SLP following. Checking CT Abd w/o   NEUROLOGIC A:   Acute Right MCA Infarct. Bilateral ICA stenosis R > L. P:   Hold ASA, plavix given UGIB. F/u with VVS as outpatient for CEA evaluation. OT & PT Consulted Fall Precautions  PULMONARY A: Acute hypoxemic respiratory failure - due to CVA. Concern for aspiration - s/p abx course. P:   Continue supplemental O2 as needed to maintain SpO2 > 92%. Pulmonary hygiene. NPO  CARDIOVASCULAR A:  Hypotension - Resolved. Secondary to UGIB.   Bilateral ICA stenosis R > L. H/O HTN, HLD. P:  Monitor on telemetry Vitals per unit protocol May require PRBC transfusion. Lipitor QHS Hold outpatient amlodipine, analapril. Hold ASA, plavix given UGIB. F/u with VVS as outpatient for CEA evaluation.  RENAL A:   Acute Renal Failure Hyperkalemia - Mild. NAGMA - Mild. P:   Continue NS @ 125 cc/hr Trending renal function & electrolytes daily Replacing electrolytes as indicated D/C KCl VT  HEMATOLOGIC A:   Anemia - due to UGIB. VTE Prophylaxis. P:  Transfuse for Hgb < 7 or active bleeding SCD's. Trending cell counts q12hr Checking INR & PTT  INFECTIOUS A:   No acute issues. P:   Monitor closely for fever & signs of infection  ENDOCRINE A:   DM - diet controlled. P:   SSI if glucose consistently > 180.  Family updated: No family bedside AM 6/28.  Interdisciplinary Family Meeting v Palliative Care Meeting:  Due by: 7/5  TODAY'S SUMMARY:  65 y.o. M admitted 6/17 with acute right MCA  infarct.  Had severe dysphagia so required PEG on 6/27.  Later developed UGIB so transferred to ICU. Bleeding from around the PEG tube insertion site identified on upper endoscopy. Continuing Protonix drip. Continuing close monitoring in intensive care unit. Trending electrolytes daily. Trending hemoglobin/hematocrit every 12 hours cell counts. Checking coags now.  I have spent a total of 39 minutes of critical care time today caring for the patient, reviewing the plan of care, and discussing the plan of care with the internal medicine teaching service.  Sonia Baller Ashok Cordia, M.D. [facility]monary [facility] Critical [facility] Pager:  [phone] After 3pm or if no response, call [phone] 11/03/2015, 7:49 AM

## 2015-11-03 NOTE — Progress Notes (Signed)
Daily Rounding Note  11/03/2015, 9:13 AM  LOS: 11 days   SUBJECTIVE:       No stools or visible bleeding from the PEG site overnight.  Note that CCM, Dr Ashok Cordia, has ordered CT abd/pelvis for today:   OBJECTIVE:         Vital signs in last 24 hours:    Temp:  [97.3 F (36.3 C)-99.5 F (37.5 C)] 97.3 F (36.3 C) (06/28 0802) Pulse Rate:  [69-108] 89 (06/28 0700) Resp:  [15-28] 20 (06/28 0700) BP: (85-175)/(51-95) 112/70 mmHg (06/28 0700) SpO2:  [95 %-100 %] 99 % (06/28 0700) Weight:  [74.8 kg (164 lb 14.5 oz)-77.565 kg (171 lb)] 74.8 kg (164 lb 14.5 oz) (06/28 0500) Last BM Date: 11/02/15 Filed Weights   11/01/15 0357 11/03/15 0047 11/03/15 0500  Weight: 77.61 kg (171 lb 1.6 oz) 77.565 kg (171 lb) 74.8 kg (164 lb 14.5 oz)   General: aphasic.  Looks acutely unwell.  Drowsy but arouseable   Heart: RRR Chest: ronchorous upper airway.  Coughing c/w compromised swallowing and difficulty managing secretions.  Abdomen: soft, NT even over PEG site.  + BS.  Small amount dried blood at PEG site.  Gauze at PEG is clean.  Extremities: some swelling in left UE Neuro/Psych:  Not verbal after questioned.  Did have strong grip on right hand.  Dense hemiparesis on left  Intake/Output from previous day: 06/27 0701 - 06/28 0700 In: 1275 [I.V.:1275] Out: 522 [Urine:520; Blood:2]  Intake/Output this shift:    Lab Results:  Recent Labs  11/02/15 0538 11/02/15 2305 11/03/15 0646  WBC 11.4* 14.9* 17.0*  HGB 13.0 9.2* 8.3*  HCT 39.7 28.9* 24.6*  PLT 291 255 235   BMET  Recent Labs  11/01/15 0635 11/02/15 0538 11/03/15 0646  NA 143 144 145  K 3.5 4.2 5.3*  CL 111 109 114*  CO2 26 25 20*  GLUCOSE 137* 103* 123*  BUN 16 17 50*  CREATININE 0.98 1.18 1.74*  CALCIUM 9.1 9.5 8.1*   LFT No results for input(s): PROT, ALBUMIN, AST, ALT, ALKPHOS, BILITOT, BILIDIR, IBILI in the last 72 hours. PT/INR No results for input(s):  LABPROT, INR in the last 72 hours. Hepatitis Panel No results for input(s): HEPBSAG, HCVAB, HEPAIGM, HEPBIGM in the last 72 hours.  Studies/Results: Ir Gastrostomy Tube Mod Sed  11/02/2015  INDICATION: History of stroke, now with dysphagia. Request made for percutaneous gastrostomy tube placement for enteric nutrition supplementation. EXAM: PULL TROUGH GASTROSTOMY TUBE PLACEMENT COMPARISON:  Abdominal radiograph - 11/01/2015; CT abdomen pelvis - 10/27/2015 MEDICATIONS: Ancef 2 gm IV; Antibiotics were administered within 1 hour of the procedure. Glucagon 1 mg IV CONTRAST:  20 mL of Isovue 300 administered into the gastric lumen. ANESTHESIA/SEDATION: Moderate (conscious) sedation was employed during this procedure. A total of Fentanyl 100 mcg was administered intravenously. Moderate Sedation Time: 8 minutes. The patient's level of consciousness and vital signs were monitored continuously by radiology nursing throughout the procedure under my direct supervision. FLUOROSCOPY TIME:  2 minutes 48 seconds (15 mGy) COMPLICATIONS: None immediate. PROCEDURE: Informed written consent was obtained from the patient's family following explanation of the procedure, risks, benefits and alternatives. A time out was performed prior to the initiation of the procedure. Ultrasound scanning was performed to demarcate the edge of the left lobe of the liver. Maximal barrier sterile technique utilized including caps, mask, sterile gowns, sterile gloves, large sterile drape, hand hygiene and Betadine prep. The left  upper quadrant was sterilely prepped and draped. An oral gastric catheter was inserted into the stomach under fluoroscopy. The existing nasogastric feeding tube was removed. The left costal margin and air / barium opacified transverse colon were identified and avoided. Air was injected into the stomach for insufflation and visualization under fluoroscopy. Under sterile conditions a 17 gauge trocar needle was utilized to  access the stomach percutaneously beneath the left subcostal margin after the overlying soft tissues were anesthetized with 1% Lidocaine with epinephrine. Needle position was confirmed within the stomach with aspiration of air and injection of small amount of contrast. A single T tack was deployed for gastropexy. Over an Amplatz guide wire, a 9-French sheath was inserted into the stomach. A snare device was utilized to capture the oral gastric catheter. The snare device was pulled retrograde from the stomach up the esophagus and out the oropharynx. The 20-French pull-through gastrostomy was connected to the snare device and pulled antegrade through the oropharynx down the esophagus into the stomach and then through the percutaneous tract external to the patient. The gastrostomy was assembled externally. Contrast injection confirms position in the stomach. Several spot radiographic images were obtained in various obliquities for documentation. The patient tolerated procedure well without immediate post procedural complication. FINDINGS: After successful fluoroscopic guided placement, the gastrostomy tube is appropriately positioned with internal disc against the ventral aspect of the gastric lumen. IMPRESSION: Successful fluoroscopic insertion of a 20-French pull-through gastrostomy tube. The gastrostomy may be used immediately for medication administration and in 24 hrs for the initiation of feeds. Electronically Signed   By: Sandi Mariscal M.D.   On: 11/02/2015 10:27   Dg Abd Portable 1v  11/02/2015  CLINICAL DATA:  Abdominal pain. EXAM: PORTABLE ABDOMEN - 1 VIEW COMPARISON:  None. FINDINGS: A percutaneous enteric tube projects over the left upper quadrant, likely entering the stomach. An IVC filter is identified. Contrast is seen in the colon, suggesting diverticulosis. No other acute abnormalities. IMPRESSION: No acute abnormalities. Electronically Signed   By: Dorise Bullion III M.D   On: 11/02/2015 23:52    Scheduled Meds: . allopurinol  300 mg Per Tube Daily  . antiseptic oral rinse  7 mL Mouth Rinse q12n4p  . atorvastatin  40 mg Per Tube q1800  . chlorhexidine  15 mL Mouth Rinse BID  . Chlorhexidine Gluconate Cloth  6 each Topical Q0600  . mupirocin ointment  1 application Nasal BID  . sodium chloride  1,000 mL Intravenous Once   Continuous Infusions: . pantoprozole (PROTONIX) infusion     PRN Meds:.acetaminophen   ASSESMENT:   *  Upper GI bleed 6/28 post 6/27 PEG.   11/03/15 EGD: bleeding at gastric aspect of PEG site.  Resolved after PEG bumper snugged up.  Day 1 Protonix infusion.  Hemostasis seems to be holding.    *  ABL anemia.  Next CBC at 1700 tonight. No transfusions to date.   *  Neurogenic dysphagia post CVA.  Plavix, ASA 325 post CVA.  Both currently on hold.    *  Aspiration PNA.     PLAN   *  ? When we can start TF.  RD outlined goal TF of Jevity 1.5 @ 55 mL/hr, 125 mL free water flushes q 3 hours At goal, Tube feeding regimen provides 1980 calories, 84 grams of protein, and 2003 mL free water.    *  Continue PPI drip (72 hours?)     Sean Collins  11/03/2015, 9:13 AM Pager: [phone]

## 2015-11-03 NOTE — Progress Notes (Signed)
Speech-Language Pathology Note:  Orders d/c as pt now with GI bleed. Please re-order SLP when ready for PO trials.  Germain Osgood, M.A. CCC-SLP [phone]

## 2015-11-03 NOTE — Anesthesia Preprocedure Evaluation (Signed)
Anesthesia Evaluation  Patient identified by MRN, date of birth, ID band Patient awake  General Assessment Comment:Pt dysarthric  Reviewed: Allergy & Precautions, NPO status , Patient's Chart, lab work & pertinent test results  Airway Mallampati: I  TM Distance: >3 FB Neck ROM: Full    Dental   Pulmonary COPD, Current Smoker,    Pulmonary exam normal        Cardiovascular hypertension, Pt. on medications Normal cardiovascular exam     Neuro/Psych CVA    GI/Hepatic   Endo/Other  diabetes, Type 2, Insulin Dependent, Oral Hypoglycemic Agents  Renal/GU      Musculoskeletal   Abdominal   Peds  Hematology   Anesthesia Other Findings   Reproductive/Obstetrics                             Anesthesia Physical Anesthesia Plan  ASA: III and emergent  Anesthesia Plan: General   Post-op Pain Management:    Induction: Intravenous, Rapid sequence and Cricoid pressure planned  Airway Management Planned: Video Laryngoscope Planned  Additional Equipment:   Intra-op Plan:   Post-operative Plan: Extubation in OR  Informed Consent: I have reviewed the patients History and Physical, chart, labs and discussed the procedure including the risks, benefits and alternatives for the proposed anesthesia with the patient or authorized representative who has indicated his/her understanding and acceptance.     Plan Discussed with: CRNA and Surgeon  Anesthesia Plan Comments:         Anesthesia Quick Evaluation

## 2015-11-03 NOTE — Clinical Social Work Note (Signed)
Patient transferred to 2M08 verbal handoff given to unit CSW, this CSW to sign off.  Jones Broom. [place], MSW, [place] 11/03/2015 8:52 AM

## 2015-11-04 ENCOUNTER — Encounter (HOSPITAL_COMMUNITY): Payer: Self-pay | Admitting: Gastroenterology

## 2015-11-04 DIAGNOSIS — E440 Moderate protein-calorie malnutrition: Secondary | ICD-10-CM | POA: Insufficient documentation

## 2015-11-04 LAB — CBC WITH DIFFERENTIAL/PLATELET
Basophils Absolute: 0.1 10*3/uL (ref 0.0–0.1)
Basophils Relative: 0 %
Eosinophils Absolute: 0.3 10*3/uL (ref 0.0–0.7)
Eosinophils Relative: 3 %
HEMATOCRIT: 20.3 % — AB (ref 39.0–52.0)
HEMOGLOBIN: 6.5 g/dL — AB (ref 13.0–17.0)
LYMPHS ABS: 2.5 10*3/uL (ref 0.7–4.0)
Lymphocytes Relative: 20 %
MCH: 26.7 pg (ref 26.0–34.0)
MCHC: 32 g/dL (ref 30.0–36.0)
MCV: 83.5 fL (ref 78.0–100.0)
MONOS PCT: 8 %
Monocytes Absolute: 1 10*3/uL (ref 0.1–1.0)
NEUTROS ABS: 8.7 10*3/uL — AB (ref 1.7–7.7)
NEUTROS PCT: 70 %
Platelets: 267 10*3/uL (ref 150–400)
RBC: 2.43 MIL/uL — ABNORMAL LOW (ref 4.22–5.81)
RDW: 15.1 % (ref 11.5–15.5)
WBC: 12.5 10*3/uL — ABNORMAL HIGH (ref 4.0–10.5)

## 2015-11-04 LAB — RENAL FUNCTION PANEL
ALBUMIN: 2.2 g/dL — AB (ref 3.5–5.0)
ANION GAP: 7 (ref 5–15)
BUN: 42 mg/dL — ABNORMAL HIGH (ref 6–20)
CO2: 22 mmol/L (ref 22–32)
Calcium: 8.6 mg/dL — ABNORMAL LOW (ref 8.9–10.3)
Chloride: 115 mmol/L — ABNORMAL HIGH (ref 101–111)
Creatinine, Ser: 1.45 mg/dL — ABNORMAL HIGH (ref 0.61–1.24)
GFR calc Af Amer: 57 mL/min — ABNORMAL LOW (ref >60)
GFR calc non Af Amer: 49 mL/min — ABNORMAL LOW (ref >60)
GLUCOSE: 99 mg/dL (ref 65–99)
PHOSPHORUS: 3.8 mg/dL (ref 2.5–4.6)
POTASSIUM: 4 mmol/L (ref 3.5–5.1)
Sodium: 144 mmol/L (ref 135–145)

## 2015-11-04 LAB — MAGNESIUM: Magnesium: 2 mg/dL (ref 1.7–2.4)

## 2015-11-04 LAB — CBC
HCT: 26.9 % — ABNORMAL LOW (ref 39.0–52.0)
Hemoglobin: 9 g/dL — ABNORMAL LOW (ref 13.0–17.0)
MCH: 28.1 pg (ref 26.0–34.0)
MCHC: 33.5 g/dL (ref 30.0–36.0)
MCV: 84.1 fL (ref 78.0–100.0)
PLATELETS: 249 10*3/uL (ref 150–400)
RBC: 3.2 MIL/uL — ABNORMAL LOW (ref 4.22–5.81)
RDW: 14.7 % (ref 11.5–15.5)
WBC: 13.8 10*3/uL — ABNORMAL HIGH (ref 4.0–10.5)

## 2015-11-04 LAB — GLUCOSE, CAPILLARY
GLUCOSE-CAPILLARY: 97 mg/dL (ref 65–99)
GLUCOSE-CAPILLARY: 107 mg/dL — AB (ref 65–99)
Glucose-Capillary: 96 mg/dL (ref 65–99)
Glucose-Capillary: 102 mg/dL — ABNORMAL HIGH (ref 65–99)

## 2015-11-04 LAB — PREPARE RBC (CROSSMATCH)

## 2015-11-04 MED ORDER — SODIUM CHLORIDE 0.9 % IV SOLN: Freq: Once | INTRAVENOUS | Status: DC

## 2015-11-04 MED ORDER — PANTOPRAZOLE SODIUM 40 MG PO PACK
40.0000 mg | PACK | Freq: Two times a day (BID) | ORAL | Status: DC
  Administered 2015-11-04 – 2015-11-08 (×9): 40 mg
  Filled 2015-11-04 (×9): qty 20

## 2015-11-04 MED ORDER — SODIUM CHLORIDE 0.9% FLUSH
10.0000 mL | Freq: Two times a day (BID) | INTRAVENOUS | Status: DC
  Administered 2015-11-04: 20 mL
  Administered 2015-11-04: 10 mL
  Administered 2015-11-05: 20 mL
  Administered 2015-11-06 – 2015-11-07 (×3): 10 mL
  Administered 2015-11-08: 40 mL

## 2015-11-04 MED ORDER — JEVITY 1.2 CAL PO LIQD
1000.0000 mL | ORAL | Status: DC
  Administered 2015-11-04: 18:00:00
  Filled 2015-11-04 (×5): qty 1000

## 2015-11-04 MED ORDER — SODIUM CHLORIDE 0.9% FLUSH: 10.0000 mL | INTRAVENOUS | Status: DC | PRN

## 2015-11-04 MED ORDER — SODIUM CHLORIDE 0.9 % IV BOLUS (SEPSIS)
500.0000 mL | Freq: Once | INTRAVENOUS | Status: AC
  Administered 2015-11-04: 500 mL via INTRAVENOUS

## 2015-11-04 NOTE — Progress Notes (Signed)
Nutrition Follow-up / Consult  DOCUMENTATION CODES:   Non-severe (moderate) malnutrition in context of chronic illness  INTERVENTION:    Initiate TF via PEG with Jevity 1.2 at goal rate of 75 ml/h (1800 ml per day) to provide 2160 kcals, 100 gm protein, 1458 ml free water daily.  NUTRITION DIAGNOSIS:   Inadequate oral intake related to dysphagia, inability to eat as evidenced by NPO status.  Ongoing  GOAL:   Patient will meet greater than or equal to 90% of their needs  Unmet  MONITOR:   I & O's, Labs, Weight trends, Diet advancement, TF tolerance  REASON FOR ASSESSMENT:   Consult Enteral/tube feeding initiation and management  ASSESSMENT:   [name] is a 65 year old gentleman with PMH of multiple CVAs, HTN, HLD, diet controlled T2DM, and tobacco use who presents with dysarthria. 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 This was around 2 pm, per ED notes, patient unable to give estimate of time this began. He says he was able to speak his intended words, but his speech was dysarthric. He says this was similar to his previous strokes. He had associated lightheadedness and gait instability where he would "fade" to the left when trying to walk straight. He did not fall or lose consciousness. He did not have any chest pain, SOB, palpitations, change in vision, headache, focal weakness, numbness or tingling.  S/P PEG placement 6/27. Developed bleeding at PEG site; EGD showed bleeding at bumper, which was then pulled taught. Ready to start feedings through PEG today. Received MD Consult for TF initiation and management. Previously received TF via Cortrak tube with Jevity 1.5 at 55 ml/h. Nutrition-Focused physical exam completed. Findings are mild-moderate fat depletion, severe muscle depletion, and no edema.   Diet Order:  Diet NPO time specified  Skin:  Reviewed, no issues  Last BM:  6/27  Height:    Ht Readings from Last 1 Encounters:  11/03/15 5\' 9"  (1.753 m)    Weight:   Wt Readings from Last 1 Encounters:  11/04/15 167 lb 8.8 oz (76 kg)    Ideal Body Weight:  72.72 kg  BMI:  Body mass index is 24.73 kg/(m^2).  Estimated Nutritional Needs:   Kcal:  2000-2200  Protein:  100-115 gm  Fluid:  2-2.2 L  EDUCATION NEEDS:   No education needs identified at this time  Sean Collins, [place], [place], [place] Pager [phone] After Hours Pager [phone]

## 2015-11-04 NOTE — Progress Notes (Signed)
Peripherally Inserted Central Catheter/Midline Placement  The IV Nurse has discussed with the patient and/or persons authorized to consent for the patient, the purpose of this procedure and the potential benefits and risks involved with this procedure.  The benefits include less needle sticks, lab draws from the catheter and patient may be discharged home with the catheter.  Risks include, but not limited to, infection, bleeding, blood clot (thrombus formation), and puncture of an artery; nerve damage and irregular heat beat.  Alternatives to this procedure were also discussed.  PICC/Midline Placement Documentation        Sean Collins 11/04/2015, 11:59 AM Phone consent obtained from brother, [facility] by Carolee Rota, RN

## 2015-11-04 NOTE — Progress Notes (Signed)
Physical Therapy Treatment Patient Details Name: Demorio Murie Westenberger MRN: [id number] DOB: [birth year] Today's Date: 11/04/2015    History of Present Illness 65 yo male presents with dysarthria. MRI-Acute RIGHT MCA territory lenticulostriate infarct 11/01/15 PEG placement 6/28 EGD PMHx-CVA in September 2016 where MRI showed multiple acute tiny right posterior frontal lobe infarcts in the MCA distribution, multiple CVAs, Lt hip fx, HTN, HLD, diet controlled T2DM, and tobacco use    PT Comments    Pt making slow, steady progress.  Follow Up Recommendations  SNF     Equipment Recommendations  Other (comment) (To be assessed at next venue)    Recommendations for Other Services       Precautions / Restrictions Precautions Precautions: Fall Restrictions Weight Bearing Restrictions: No    Mobility  Bed Mobility Overal bed mobility: Needs Assistance Bed Mobility: Supine to Sit;Sit to Supine     Supine to sit: +2 for physical assistance;Max assist Sit to supine: +2 for physical assistance;Mod assist   General bed mobility comments: Pt able to move RLE off of bed. Assist to bring LLE off bed, elevate trunk into sitting, and bring hips to EOB.  Transfers Overall transfer level: Needs assistance Equipment used:  (Used back of high back chair with RUE) Transfers: Sit to/from Stand Sit to Stand: +2 physical assistance;Mod assist         General transfer comment: Assist to bring hips and trunk up and to block LLE. Stood x 3.  Ambulation/Gait                 Stairs            Wheelchair Mobility    Modified Rankin (Stroke Patients Only)       Balance Overall balance assessment: Needs assistance Sitting-balance support: Single extremity supported Sitting balance-Leahy Scale: Poor Sitting balance - Comments: Pt sat EOB x 15 minutes with min to min guard assist. Pt able to correct lean with verbal cues. Postural control: Left lateral lean Standing balance  support: Single extremity supported Standing balance-Leahy Scale: Poor Standing balance comment: Stood x 3 for 60-90 sec with +2 min to mod A. Pt with incr lt lean as he fatigued.                    Cognition Arousal/Alertness: Awake/alert Behavior During Therapy: Flat affect Overall Cognitive Status: Difficult to assess                      Exercises      General Comments        Pertinent Vitals/Pain      Home Living                      Prior Function            PT Goals (current goals can now be found in the care plan section) Progress towards PT goals: Progressing toward goals    Frequency  Min 2X/week    PT Plan Frequency needs to be updated    Co-evaluation             End of Session Equipment Utilized During Treatment: Gait belt Activity Tolerance: Patient tolerated treatment well Patient left: in bed;with call bell/phone within reach (returned to bed due to pt to have picc placed.)     Time: [id number] PT Time Calculation (min) (ACUTE ONLY): 21 min  Charges:  $Therapeutic Activity: 8-22 mins  G Codes:      Kaegan Hettich 11/04/2015, 9:45 AM [facility] PT [phone]

## 2015-11-04 NOTE — Progress Notes (Signed)
[place] Progress Note Patient Name: Sean Collins DOB: [birth year] MRN: [id number]   Date of Service  11/04/2015  HPI/Events of Note  Tf were started today Got blood today Just had large bloody BM NO changes in BP or hr  eICU Interventions  Will dc tf hct in 1 hour post tx Monitor closely HR/BP, ensure up to date type     Intervention Category Intermediate Interventions: Bleeding - evaluation and treatment with blood products  Raylene Miyamoto. 11/04/2015, 7:36 PM

## 2015-11-04 NOTE — Progress Notes (Signed)
PULMONARY / CRITICAL CARE MEDICINE   Name: Sean Collins MRN: [id number] DOB: [birth year]    ADMISSION DATE:  10/23/2015 CONSULTATION DATE:  11/02/15  REFERRING MD:  Dareen Piano ([facility])  CHIEF COMPLAINT:  GI Bleed  HISTORY OF PRESENT ILLNESS:  Pt is dysarthric; therefore, this HPI is obtained from chart review. Sean Collins is a 65 y.o. male with PMH including but not limited to previous CVA's in 2016 thought to be secondary to atherosclerosis.  He was admitted 10/23/15 with acute right MCA infarct.  He was also found to have right ICA stenosis (40-59%).  He had worsening deficits following admission and repeat MRI on 6/19 showed evolution of initial stroke with no new infarct.  VVS were initially planning on CEA prior to discharge; however, due to his ongoing neuro deficits, decided to postpone this to an outpatient procedure once he has recovered.  He had problems with dysphagia and suspected aspiration PNA; therefore, PEG tube ended up being placed on morning of 11/02/15 (placed by IR).  Later that evening, pt had vomiting episode consisting of 2 cups of bright red blood as well as a BM consisting of 3/4 cup of dark colored stools.  He had associated borderline BP's during these episodes and complained of abdominal pain.  IR was contacted by primary team and they felt that bleed was likely venous and did not require emergent arteriography.  They recommended transfer to ICU.  PCCM was subsequently called in consultation.  GI was also contacted by primary team and they will plan to see pt tonight.  SUBJECTIVE: No acute events overnight. No report of hematochezia or melanotic stools. Patient's hemoglobin decreased this morning but normotensive. Patient nods "no" to any pain, difficulty breathing, or nausea.  REVIEW OF SYSTEMS:  Unable to obtain given aphasia.  VITAL SIGNS: BP 119/73 mmHg  Pulse 89  Temp(Src) 98.5 F (36.9 C) (Oral)  Resp 20  Ht 5\' 9"  (1.753 m)  Wt 48.5 kg (106 lb  14.8 oz)  BMI 15.78 kg/m2  SpO2 100%  HEMODYNAMICS:    VENTILATOR SETTINGS:    INTAKE / OUTPUT: I/O last 3 completed shifts: In: [id number] [I.V.:1325; IV Piggyback:300] Out: 1197 [Urine:1195; Blood:2]   PHYSICAL EXAMINATION: General: No distress. Eyes closed and sleeping until awoken. Neuro: Predominantly expressive aphasia. No movement on his left side. Strength grossly 5/5 on the right. HEENT: No scleral icterus or injection. Moist mucous membranes. Cardiovascular: Regular rate. No appreciable JVD. Lungs: Clear on auscultation. Normal work of breathing. No accessory muscle use. Abdomen: Soft. Nondistended. PEG tube in place. Nontender. Musculoskeletal: No joint deformity or effusion appreciated.  Skin: Warm and dry. No rash on exposed skin.  LABS:  BMET  Recent Labs Lab 11/02/15 0538 11/03/15 0646 11/04/15 0416  NA 144 145 144  K 4.2 5.3* 4.0  CL 109 114* 115*  CO2 25 20* 22  BUN 17 50* 42*  CREATININE 1.18 1.74* 1.45*  GLUCOSE 103* 123* 99    Electrolytes  Recent Labs Lab 10/30/15 0628  11/02/15 0538 11/03/15 0646 11/04/15 0416  CALCIUM 9.3  < > 9.5 8.1* 8.6*  MG 1.7  --   --   --  2.0  PHOS  --   --   --   --  3.8  < > = values in this interval not displayed.  CBC  Recent Labs Lab 11/03/15 0646 11/03/15 1641 11/04/15 0416  WBC 17.0* 13.7* 12.5*  HGB 8.3* 7.2* 6.5*  HCT 24.6* 22.2* 20.3*  PLT 235  [facility] 11/03/15 1324  APTT 22*  INR 1.26    Sepsis Markers No results for input(s): LATICACIDVEN, PROCALCITON, O2SATVEN in the last 168 hours.  ABG No results for input(s): PHART, PCO2ART, PO2ART in the last 168 hours.  Liver Enzymes  Recent Labs Lab 11/04/15 0416  ALBUMIN 2.2*    Cardiac Enzymes No results for input(s): TROPONINI, PROBNP in the last 168 hours.  Glucose  Recent Labs Lab 11/03/15 1120 11/03/15 1514 11/03/15 1942 11/03/15 2328 11/04/15 0345 11/04/15 0736  GLUCAP 114* 88 93 107*  96 97    Imaging Ct Abdomen Wo Contrast  11/03/2015  CLINICAL DATA:  Bleeding from peg tube insertion site. EXAM: CT ABDOMEN WITHOUT CONTRAST TECHNIQUE: Multidetector CT imaging of the abdomen was performed following the standard protocol without IV contrast. COMPARISON:  CT scan 10/27/2015 FINDINGS: Lower chest: The lung bases demonstrate dependent atelectasis/ edema. Stable peribronchial thickening and probable scarring changes. No pleural effusion. The heart is normal in size. Extensive coronary artery calcifications. Hepatobiliary: No focal hepatic lesions. The gallbladder appears normal. No common bile duct dilatation. Pancreas: No mass, inflammation or ductal dilatation. Spleen: Normal size.  No focal lesions. Adrenals/Urinary Tract: The adrenal glands and kidneys are grossly normal. Stomach/Bowel: The feeding tube is normally positioned in the antral region of the stomach. No complicating features are demonstrated. No surrounding fluid collections or hematoma. The transverse colon is normal. The small bowel is unremarkable. Vascular/Lymphatic: Advanced atherosclerotic calcifications involving the aorta and branch vessels. No aneurysm. IVC filter is noted. No mesenteric or retroperitoneal mass or adenopathy. Other: No ascites or free air. Musculoskeletal: No significant bony findings. IMPRESSION: Peg tube appears to be in good position without complicating features. No acute abdominal findings. Electronically Signed   By: Marijo Sanes M.D.   On: 11/03/2015 12:05     STUDIES:  MRI brain 6/17: acute right MCA infarct, nonhemorrhagic. MRI brain 6/19: interval enlargement of right posterior corona radiata / posterior right lenticular nucleus nonhemorrhagic infarct. CTA neck 6/18: 50% stenosis at origin of right ICA and 50% stenosis distally.  40% stenosis of left ICA. Echo 6/18: EF 55-60%.  PA peak 55. CT Abd W/O 6/28: Peg tube in good position without complicating features. No ascites or free air.  No surrounding fluid collections or hematoma.  MICROBIOLOGY: MRSA PCR 6/27:  Positive   ANTIBIOTICS: Ancef 6/27 > 6/27   SIGNIFICANT EVENTS: 6/17 - admit. 6/27 - PEG placed.  Later developed UGIB so transferred to ICU. 6/28 - EGD Armbruster  LINES/TUBES: PEG 6/27 >>  ASSESSMENT / PLAN:  GASTROINTESTINAL A:   UGIB - From PEG tube insertion site. Dysphagia - S/P PEG 6/27.  P:   D/C PPI gtt. Start Protonix 40mg  VT BID Continue NPO. Resume tube feedings today SLP following  HEMATOLOGIC A:   Anemia - Due to UGIB at PEG insertion site. Suspect worsening due to re-equilibration. Leukocytosis - Improving. Likely stress response. VTE Prophylaxis.  P:  Transfuse for Hgb < 7 or active bleeding SCD's. Trending cell counts q12hr PICC placement for access Transfuse 2u PRBC  NEUROLOGIC A:   Acute Right MCA CVA Bilateral ICA stenosis R > L  P:   Hold ASA & Plavix  F/u with VVS as outpatient for CEA evaluation. PT & SLP following Fall Precautions  PULMONARY A: Acute Hypoxic Respiratory Failure - Resolved. Possible Previous Aspiration Pneumonia - S/P antibiotic course.  P:   Continue supplemental O2 as needed to maintain SpO2 >  92%. Pulmonary hygiene. NPO  CARDIOVASCULAR A:  Hypotension - Resolved. Secondary to UGIB.   Bilateral ICA stenosis R > L. H/O HTN & HLD.  P:  Monitor on telemetry Vitals per unit protocol Lipitor QHS VT Hold outpatient Norvasc & Vasotec. Hold ASA & Plavix given UGIB. F/u with VVS as outpatient for CEA evaluation.  RENAL A:   Acute Renal Failure - Improving. Hyperkalemia - Resolved. NAGMA - Mild. Secondary to hyperchloremia. Hyperchloremia - Mild.  P:   Continue NS @ 125 cc/hr Trending renal function & electrolytes daily Replacing electrolytes as indicated  INFECTIOUS A:   No acute issues.  P:   Monitor closely for fever & signs of infection  ENDOCRINE A:   DM - Diet controlled.  P:   Monitor glucose on daily  labs.  Family updated: No family bedside AM 6/29.  Interdisciplinary Family Meeting v Palliative Care Meeting:  Due by: 7/5  TODAY'S SUMMARY:  65 y.o. M admitted 6/17 with acute right MCA infarct.  Had severe dysphagia so required PEG on 6/27.  Later developed UGIB at PEG insertion site. Patient has worsening of his anemia likely due to redistribution from his previous GI bleeding. No evidence of further ongoing bleeding. Transfusing 2 units of packed red blood cells & having PICC line placed due to limited access. Given the potential for repeat bleeding I will continue monitoring in the intensive care unit. Switching from Protonix drip to Protonix via PEG tube twice daily & starting tube feedings per GI recommendations.  I have spent a total of 31 minutes of critical care time today caring for the patient, reviewing the plan of care, and discussing the plan of care with the internal medicine teaching service.  Sonia Baller Ashok Cordia, M.D. [facility] Pager:  [phone] After 3pm or if no response, call [phone] 11/04/2015, 9:57 AM

## 2015-11-05 DIAGNOSIS — R52 Pain, unspecified: Secondary | ICD-10-CM

## 2015-11-05 LAB — RENAL FUNCTION PANEL
ALBUMIN: 2.5 g/dL — AB (ref 3.5–5.0)
ANION GAP: 8 (ref 5–15)
BUN: 28 mg/dL — ABNORMAL HIGH (ref 6–20)
CO2: 24 mmol/L (ref 22–32)
Calcium: 9 mg/dL (ref 8.9–10.3)
Chloride: 112 mmol/L — ABNORMAL HIGH (ref 101–111)
Creatinine, Ser: 1.26 mg/dL — ABNORMAL HIGH (ref 0.61–1.24)
GFR calc Af Amer: >60 mL/min (ref >60)
GFR, EST NON AFRICAN AMERICAN: 58 mL/min — AB (ref >60)
Glucose, Bld: 101 mg/dL — ABNORMAL HIGH (ref 65–99)
PHOSPHORUS: 3.7 mg/dL (ref 2.5–4.6)
POTASSIUM: 3.7 mmol/L (ref 3.5–5.1)
Sodium: 144 mmol/L (ref 135–145)

## 2015-11-05 LAB — CBC WITH DIFFERENTIAL/PLATELET
BASOS PCT: 0 %
Basophils Absolute: 0 10*3/uL (ref 0.0–0.1)
EOS ABS: 0.3 10*3/uL (ref 0.0–0.7)
Eosinophils Relative: 2 %
HEMATOCRIT: 28.6 % — AB (ref 39.0–52.0)
HEMOGLOBIN: 9.4 g/dL — AB (ref 13.0–17.0)
LYMPHS ABS: 2.3 10*3/uL (ref 0.7–4.0)
Lymphocytes Relative: 14 %
MCH: 27.2 pg (ref 26.0–34.0)
MCHC: 32.9 g/dL (ref 30.0–36.0)
MCV: 82.7 fL (ref 78.0–100.0)
Monocytes Absolute: 1.1 10*3/uL — ABNORMAL HIGH (ref 0.1–1.0)
Monocytes Relative: 7 %
NEUTROS ABS: 12.1 10*3/uL — AB (ref 1.7–7.7)
NEUTROS PCT: 77 %
Platelets: 303 10*3/uL (ref 150–400)
RBC: 3.46 MIL/uL — AB (ref 4.22–5.81)
RDW: 14.8 % (ref 11.5–15.5)
WBC: 15.8 10*3/uL — AB (ref 4.0–10.5)

## 2015-11-05 LAB — GLUCOSE, CAPILLARY
GLUCOSE-CAPILLARY: 104 mg/dL — AB (ref 65–99)
GLUCOSE-CAPILLARY: 107 mg/dL — AB (ref 65–99)
GLUCOSE-CAPILLARY: 112 mg/dL — AB (ref 65–99)
GLUCOSE-CAPILLARY: 85 mg/dL (ref 65–99)
GLUCOSE-CAPILLARY: 86 mg/dL (ref 65–99)
GLUCOSE-CAPILLARY: 95 mg/dL (ref 65–99)
Glucose-Capillary: 102 mg/dL — ABNORMAL HIGH (ref 65–99)

## 2015-11-05 LAB — TYPE AND SCREEN
ABO/RH(D): O POS
Antibody Screen: NEGATIVE
UNIT DIVISION: 0
Unit division: 0

## 2015-11-05 LAB — HEMOGLOBIN AND HEMATOCRIT, BLOOD
HCT: 27.7 % — ABNORMAL LOW (ref 39.0–52.0)
HEMATOCRIT: 28 % — AB (ref 39.0–52.0)
Hemoglobin: 9.2 g/dL — ABNORMAL LOW (ref 13.0–17.0)
Hemoglobin: 9.2 g/dL — ABNORMAL LOW (ref 13.0–17.0)

## 2015-11-05 LAB — MAGNESIUM: MAGNESIUM: 2 mg/dL (ref 1.7–2.4)

## 2015-11-05 MED ORDER — WHITE PETROLATUM GEL
Status: AC
  Administered 2015-11-05: 0.2
  Filled 2015-11-05: qty 1

## 2015-11-05 MED ORDER — STROKE: EARLY STAGES OF RECOVERY BOOK
Freq: Once | Status: AC
  Administered 2015-11-05: 20:00:00
  Filled 2015-11-05: qty 1

## 2015-11-05 MED ORDER — JEVITY 1.2 CAL PO LIQD
1000.0000 mL | ORAL | Status: DC
  Administered 2015-11-05 – 2015-11-08 (×3): 1000 mL
  Filled 2015-11-05 (×9): qty 1000

## 2015-11-05 NOTE — Progress Notes (Signed)
PULMONARY / CRITICAL CARE MEDICINE   Name: Sean Collins MRN: [id number] DOB: 1950/11/29    ADMISSION DATE:  10/23/2015 CONSULTATION DATE:  11/02/15  REFERRING MD:  Dareen Piano ([facility])  CHIEF COMPLAINT:  GI Bleed  HISTORY OF PRESENT ILLNESS:  Pt is dysarthric; therefore, this HPI is obtained from chart review. Sean Collins is a 65 y.o. male with PMH including but not limited to previous CVA's in 2016 thought to be secondary to atherosclerosis.  He was admitted 10/23/15 with acute right MCA infarct.  He was also found to have right ICA stenosis (40-59%).  He had worsening deficits following admission and repeat MRI on 6/19 showed evolution of initial stroke with no new infarct.  VVS were initially planning on CEA prior to discharge; however, due to his ongoing neuro deficits, decided to postpone this to an outpatient procedure once he has recovered.  He had problems with dysphagia and suspected aspiration PNA; therefore, PEG tube ended up being placed on morning of 11/02/15 (placed by IR).  Later that evening, pt had vomiting episode consisting of 2 cups of bright red blood as well as a BM consisting of 3/4 cup of dark colored stools.  He had associated borderline BP's during these episodes and complained of abdominal pain.  IR was contacted by primary team and they felt that bleed was likely venous and did not require emergent arteriography.  They recommended transfer to ICU.  PCCM was subsequently called in consultation.  GI was also contacted by primary team and they will plan to see pt tonight.  SUBJECTIVE: Patient had large "bloody" bowel movement overnight but vitals have remained stable. No further witnessed hematochezia or melena. Tube feedings were held overnight. Patient nods no to any abdominal pain or nausea. He nods no to any chest pain or difficulty breathing. He does report that he is having pain in his left arm/side.  REVIEW OF SYSTEMS:  Unable to obtain given  aphasia.  VITAL SIGNS: BP 135/77 mmHg  Pulse 81  Temp(Src) 97.5 F (36.4 C) (Oral)  Resp 20  Ht 5\' 9"  (1.753 m)  Wt 74.7 kg (164 lb 10.9 oz)  BMI 24.31 kg/m2  SpO2 100%  HEMODYNAMICS:    VENTILATOR SETTINGS:    INTAKE / OUTPUT: I/O last 3 completed shifts: In: 1428.5 [I.V.:70; Blood:996; NG/GT:62.5; IV Piggyback:300] Out: [id number] [Urine:1775]   PHYSICAL EXAMINATION: General: No distress. Nurse at bedside. Awake. Neuro: Predominantly expressive aphasia. No movement on his left side. 5/5 strength on the right unchanged. Following commands. HEENT: No scleral icterus. Moist mucous membranes. Cardiovascular: Regular rate & rhythm. No appreciable JVD. Lungs: Clear on auscultation. Normal work of breathing. No accessory muscle use on room air. Abdomen: Soft. Nondistended. PEG tube in place with clean and dry dressing surrounding. Nontender. Musculoskeletal: No joint deformity or effusion appreciated.  Skin: Warm and dry. No rash on exposed skin.  LABS:  BMET  Recent Labs Lab 11/03/15 0646 11/04/15 0416 11/05/15 0614  NA 145 144 144  K 5.3* 4.0 3.7  CL 114* 115* 112*  CO2 20* 22 24  BUN 50* 42* 28*  CREATININE 1.74* 1.45* 1.26*  GLUCOSE 123* 99 101*    Electrolytes  Recent Labs Lab 10/30/15 0628  11/03/15 0646 11/04/15 0416 11/05/15 0614  CALCIUM 9.3  < > 8.1* 8.6* 9.0  MG 1.7  --   --  2.0 2.0  PHOS  --   --   --  3.8 3.7  < > = values in this interval  not displayed.  CBC  Recent Labs Lab 11/04/15 0416 11/04/15 2045 11/05/15 0614 11/05/15 0813  WBC 12.5* 13.8* 15.8*  --   HGB 6.5* 9.0* 9.4* 9.2*  HCT 20.3* 26.9* 28.6* 27.7*  PLT 267 249 303  --     Coag's  Recent Labs Lab 11/03/15 1324  APTT 22*  INR 1.26    Sepsis Markers No results for input(s): LATICACIDVEN, PROCALCITON, O2SATVEN in the last 168 hours.  ABG No results for input(s): PHART, PCO2ART, PO2ART in the last 168 hours.  Liver Enzymes  Recent Labs Lab 11/04/15 0416  11/05/15 0614  ALBUMIN 2.2* 2.5*    Cardiac Enzymes No results for input(s): TROPONINI, PROBNP in the last 168 hours.  Glucose  Recent Labs Lab 11/04/15 1519 11/04/15 2035 11/05/15 0009 11/05/15 0350 11/05/15 0757 11/05/15 1116  GLUCAP 102* 107* 86 107* 102* 85    Imaging No results found.   STUDIES:  MRI brain 6/17: acute right MCA infarct, nonhemorrhagic. MRI brain 6/19: interval enlargement of right posterior corona radiata / posterior right lenticular nucleus nonhemorrhagic infarct. CTA neck 6/18: 50% stenosis at origin of right ICA and 50% stenosis distally.  40% stenosis of left ICA. Echo 6/18: EF 55-60%.  PA peak 55. CT Abd W/O 6/28: Peg tube in good position without complicating features. No ascites or free air. No surrounding fluid collections or hematoma.  MICROBIOLOGY: MRSA PCR 6/27:  Positive   ANTIBIOTICS: Ancef 6/27 > 6/27   SIGNIFICANT EVENTS: 6/17 - admit. 6/27 - PEG placed.  Later developed UGIB so transferred to ICU. 6/28 - EGD Armbruster  LINES/TUBES: PEG 6/27 >> RUE PICC 6/29>>  ASSESSMENT / PLAN:  GASTROINTESTINAL A:   UGIB - From PEG tube insertion site. No evidence of ongoing bleeding. Dysphagia - S/P PEG 6/27.  P:   Protonix 40mg  VT BID Continue NPO. Resume Tube Feedings SLP following  HEMATOLOGIC A:   Anemia - Due to UGIB at PEG insertion site. Post transfusion Hgb stable. S/P 2u PRBC 6/29. Leukocytosis - Improving. Likely stress response. VTE Prophylaxis.  P:  Transfuse for Hgb < 7 or active bleeding SCD's. Trending cell counts q12hr  NEUROLOGIC A:   Acute Right MCA CVA Bilateral ICA stenosis R > L  P:   Hold ASA & Plavix  F/u with VVS as outpatient for CEA evaluation. PT & SLP following Fall Precautions Tylenol VT prn pain  PULMONARY A: Acute Hypoxic Respiratory Failure - Resolved. Possible Previous Aspiration Pneumonia - S/P antibiotic course.  P:   Continue supplemental O2 as needed to maintain SpO2  > 92%. Pulmonary hygiene. NPO  CARDIOVASCULAR A:  Hypotension - Resolved. Secondary to UGIB.   Bilateral ICA stenosis R > L. H/O HTN & HLD.  P:  Monitor on telemetry Vitals per unit protocol Lipitor QHS VT Hold outpatient Norvasc & Vasotec. Hold ASA & Plavix given UGIB. F/u with VVS as outpatient for CEA evaluation.  RENAL A:   Acute Renal Failure - Resolving. Hyperkalemia - Resolved. NAGMA - Resolved. Secondary to hyperchloremia. Hyperchloremia - Mild.  P:   Continue NS @ 125 cc/hr Trending renal function & electrolytes daily Replacing electrolytes as indicated  INFECTIOUS A:   No acute issues.  P:   Monitor closely for fever & signs of infection  ENDOCRINE A:   DM - Diet controlled.  P:   Monitor glucose on daily labs.  Family updated: No family bedside AM 6/30.  Interdisciplinary Family Meeting v Palliative Care Meeting:  Due by: 7/5  TODAY'S  SUMMARY:  65 y.o. M admitted 6/17 with acute right MCA infarct.  Had severe dysphagia so required PEG on 6/27.  Later developed UGIB at PEG insertion site. Patient's hemoglobin has remained stable from his upper GI bleed. Suspect his "bloody" bowel movement last night was due to previous clot noted in the stomach on his EGD. Have resume patient's medications and tube feedings. Continuing to trend hemoglobin every 12 hours. Given patient's continued clinical stability I am transitioning him to a SDU bed. IMTS will assume care & PCCM will sign off as of 7/1.  Sonia Baller Ashok Cordia, M.D. [facility] Pager:  [phone] After 3pm or if no response, call [phone] 11/05/2015, 12:11 PM

## 2015-11-05 NOTE — Progress Notes (Signed)
Pt tx to 3S per MD order, pt VSS, pt needs reinforcement of tx, report called to receiving RN, all questions answered

## 2015-11-05 NOTE — Care Management Note (Signed)
Case Management Note  Patient Details  Name: Sean Collins MRN: [id number] Date of Birth: 02-10-1951  Subjective/Objective:                    Action/Plan: Pt in with CVA. Plan is for PEG placement in the am and then SNF when tolerating tube feedings. CM following for d/c needs.   Expected Discharge Date:                  Expected Discharge Plan:  Skilled Nursing Facility  In-House Referral:  Clinical Social Work  Discharge planning Services  CM Consult  Post Acute Care Choice:    Choice offered to:     DME Arranged:    DME Agency:     HH Arranged:    [place] Agency:     Status of Service:  In process, will continue to follow  If discussed at Long Length of Stay Meetings, dates discussed:    Additional Comments: CSW consulted for SNF placement Maryclare Labrador, RN 11/05/2015, 11:52 AM

## 2015-11-06 DIAGNOSIS — R4781 Slurred speech: Secondary | ICD-10-CM | POA: Insufficient documentation

## 2015-11-06 DIAGNOSIS — K922 Gastrointestinal hemorrhage, unspecified: Secondary | ICD-10-CM | POA: Insufficient documentation

## 2015-11-06 DIAGNOSIS — I2510 Atherosclerotic heart disease of native coronary artery without angina pectoris: Secondary | ICD-10-CM

## 2015-11-06 DIAGNOSIS — I69391 Dysphagia following cerebral infarction: Secondary | ICD-10-CM

## 2015-11-06 DIAGNOSIS — E119 Type 2 diabetes mellitus without complications: Secondary | ICD-10-CM

## 2015-11-06 DIAGNOSIS — I69322 Dysarthria following cerebral infarction: Secondary | ICD-10-CM

## 2015-11-06 DIAGNOSIS — D649 Anemia, unspecified: Secondary | ICD-10-CM

## 2015-11-06 LAB — CBC WITH DIFFERENTIAL/PLATELET
BASOS PCT: 0 %
Basophils Absolute: 0 10*3/uL (ref 0.0–0.1)
EOS ABS: 0.4 10*3/uL (ref 0.0–0.7)
Eosinophils Relative: 2 %
HEMATOCRIT: 28.4 % — AB (ref 39.0–52.0)
HEMOGLOBIN: 9.3 g/dL — AB (ref 13.0–17.0)
Lymphocytes Relative: 12 %
Lymphs Abs: 1.9 10*3/uL (ref 0.7–4.0)
MCH: 27.9 pg (ref 26.0–34.0)
MCHC: 32.7 g/dL (ref 30.0–36.0)
MCV: 85.3 fL (ref 78.0–100.0)
MONOS PCT: 6 %
Monocytes Absolute: 1 10*3/uL (ref 0.1–1.0)
NEUTROS ABS: 12.4 10*3/uL — AB (ref 1.7–7.7)
NEUTROS PCT: 80 %
Platelets: 322 10*3/uL (ref 150–400)
RBC: 3.33 MIL/uL — AB (ref 4.22–5.81)
RDW: 14.7 % (ref 11.5–15.5)
WBC: 15.7 10*3/uL — ABNORMAL HIGH (ref 4.0–10.5)

## 2015-11-06 LAB — GLUCOSE, CAPILLARY
GLUCOSE-CAPILLARY: 128 mg/dL — AB (ref 65–99)
GLUCOSE-CAPILLARY: 149 mg/dL — AB (ref 65–99)
GLUCOSE-CAPILLARY: 99 mg/dL (ref 65–99)
Glucose-Capillary: 112 mg/dL — ABNORMAL HIGH (ref 65–99)
Glucose-Capillary: 102 mg/dL — ABNORMAL HIGH (ref 65–99)

## 2015-11-06 LAB — RENAL FUNCTION PANEL
ALBUMIN: 2.4 g/dL — AB (ref 3.5–5.0)
ANION GAP: 8 (ref 5–15)
BUN: 26 mg/dL — ABNORMAL HIGH (ref 6–20)
CALCIUM: 9 mg/dL (ref 8.9–10.3)
CO2: 24 mmol/L (ref 22–32)
Chloride: 113 mmol/L — ABNORMAL HIGH (ref 101–111)
Creatinine, Ser: 1.35 mg/dL — ABNORMAL HIGH (ref 0.61–1.24)
GFR calc non Af Amer: 54 mL/min — ABNORMAL LOW (ref >60)
GLUCOSE: 128 mg/dL — AB (ref 65–99)
PHOSPHORUS: 3.8 mg/dL (ref 2.5–4.6)
POTASSIUM: 3.7 mmol/L (ref 3.5–5.1)
SODIUM: 145 mmol/L (ref 135–145)

## 2015-11-06 LAB — MAGNESIUM: Magnesium: 2.2 mg/dL (ref 1.7–2.4)

## 2015-11-06 NOTE — Evaluation (Signed)
Clinical/Bedside Swallow Evaluation Patient Details  Name: Phi Olczak Lenhard MRN: [id number] Date of Birth: [birth year]  Today's Date: 11/06/2015 Time: SLP Start Time (ACUTE ONLY): [id number] SLP Stop Time (ACUTE ONLY): 1425 SLP Time Calculation (min) (ACUTE ONLY): 29 min  Past Medical History:  Past Medical History  Diagnosis Date  . Hypertension   . Hyperlipidemia   . Stroke ([place])   . Diabetes mellitus without complication [facility])    Past Surgical History:  Past Surgical History  Procedure Laterality Date  . Esophagogastroduodenoscopy (egd) with propofol N/A 11/03/2015    Procedure: ESOPHAGOGASTRODUODENOSCOPY (EGD) WITH PROPOFOL;  Surgeon: Manus Gunning, MD;  Location: [facility];  Service: Gastroenterology;  Laterality: N/A;   HPI:  Mr. Theophil Guglielmo Schildt is a 65 year old gentleman who presents with dysarthria. MRI revealed acute RIGHT MCA territory lenticulostriate infarct, repeat MRI showed showed more prominent right BG/CR infarct.  PMH: CVA (September 2016) where MRI showed multiple acute tiny right posterior frontal lobe infarcts, multiple CVAs, Lt hip fx, HTN, HLD, diet controlled T2DM, and tobacco use. Initial BSE 10/25/15 severe oral motor deficits, decreased secretion management, holding bolus and suspected aspiration. Pt has has multiple ST sessions for dysphagia and has not demonstrated appropriateness to recommend objective assessment. Received PEG 6/27; developed GI bleed 6/28, GI d/c'd ST orders. ST re-ordered 7/1.   Assessment / Plan / Recommendation Clinical Impression  Pt continues to demonstrate severe oral dysphagia. 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 Pt attempted to manipulate puree bolus but unable to transit, applesauce residue on anterior tongue with inability to transit to initiate pharyngeal swallow. He required suctioning of all puree. Gravity propels  thin liquid to posterior oral cavity with suspected liquid in pharynx prior to swallow initiation. Immediate throat clear following swallow. Vocal quality wet prior, during and post po's.      Aspiration Risk  Severe aspiration risk    Diet Recommendation NPO   Medication Administration: Via alternative means    Other  Recommendations Oral Care Recommendations: Oral care QID   Follow up Recommendations  Skilled Nursing facility    Frequency and Duration min 2x/week  2 weeks       Prognosis Prognosis for Safe Diet Advancement: Fair Barriers to Reach Goals: Severity of deficits      Swallow Study   General HPI: Mr. Muriel Ohm Lizak is a 65 year old gentleman who presents with dysarthria. MRI revealed acute RIGHT MCA territory lenticulostriate infarct, repeat MRI showed showed more prominent right BG/CR infarct.  PMH: CVA (September 2016) where MRI showed multiple acute tiny right posterior frontal lobe infarcts, multiple CVAs, Lt hip fx, HTN, HLD, diet controlled T2DM, and tobacco use. Initial BSE 10/25/15 severe oral motor deficits, decreased secretion management, holding bolus and suspected aspiration. Pt has has multiple ST sessions for dysphagia and has not demonstrated appropriateness to recommend objective assessment. Received PEG 6/27; developed GI bleed 6/28, GI d/c'd ST orders. ST re-ordered 7/1. Type of Study: Bedside Swallow Evaluation Previous Swallow Assessment:  (see HPI) Diet Prior to this Study: NPO;PEG tube Temperature Spikes Noted: No Respiratory Status: Room air History of Recent Intubation: No Behavior/Cognition: Alert;Cooperative;Requires cueing Oral Cavity Assessment: Excessive secretions (loose mucous on hard palate) Oral Care Completed by SLP: Yes Oral Cavity - Dentition: Poor condition Vision: Functional for self-feeding Self-Feeding Abilities: Able to feed self Patient Positioning: Upright in bed Baseline Vocal Quality: Wet;Low vocal  intensity Volitional  Cough: Weak Volitional Swallow: Able to elicit    Oral/Motor/Sensory Function Overall Oral Motor/Sensory Function: Severe impairment Facial ROM: Suspected CN VII (facial) dysfunction Facial Symmetry: Suspected CN VII (facial) dysfunction Facial Strength: Reduced left;Reduced right Lingual ROM: Suspected CN XII (hypoglossal) dysfunction (unable to elevate or lateralize tongue) Lingual Symmetry: Suspected CN XII (hypoglossal) dysfunction Lingual Strength: Reduced;Suspected CN XII (hypoglossal) dysfunction Mandible: Within Functional Limits   Ice Chips Ice chips: Not tested   Thin Liquid Thin Liquid: Impaired Presentation: Self Fed;Cup Oral Phase Impairments: Reduced labial seal Oral Phase Functional Implications: Right anterior spillage;Prolonged oral transit Pharyngeal  Phase Impairments: Suspected delayed Swallow;Throat Clearing - Immediate;Decreased hyoid-laryngeal movement;Multiple swallows    Nectar Thick Nectar Thick Liquid: Not tested   Honey Thick Honey Thick Liquid: Not tested   Puree Puree: Impaired Presentation: Self Fed;Spoon Oral Phase Impairments: Reduced labial seal;Reduced lingual movement/coordination Oral Phase Functional Implications:  (anterior lingual residue-suctioned, unable to transit) Pharyngeal Phase Impairments:  (no swallow initiated)   Solid   GO   Solid: Not tested        Houston Siren 11/06/2015,2:56 PM   Orbie Pyo Colvin Caroli.Ed [employer] [phone]

## 2015-11-06 NOTE — Progress Notes (Signed)
      ICU TRANSFER NOTE  Subjective: Sean Collins is a 65yo man with PMHx of multiple CVAs, HTN, hyperlipidemia, type 2 DM, and tobacco abuse who was initially admitted on 6/17 for dysarthria and dizziness found to have an acute right MCA territory lenticulostriate infarct on MRI. He was not a candidate for tPA as he was outside the window. A few days later he developed worsening dysarthria and left upper and lower extremity weakness. Repeat MRI showed worsening of the original infarct. On 6/20, he developed an elevated WBC count and CXR with concern for aspiration pneumonia. He completed a 7 day course of Unasyn. He then had a PEG tube placed (after a 6 day Plavix washout) by IR on 6/27. He subsequently had hematemesis (2 cups worth) and melena later that evening and was transferred to the ICU. He received 2 units PRBCs. GI performed an EGD on 6/28 which revealed active bleeding in the gastric body related to the PEG tube site. The bleeding resolved after the PEG tube site was tightened. His hemoglobin has remained stable in the 9 range and he was transferred back to IMTS today (7/1).  Today he was alert and trying to speak, but we were unable to understand. No C/O abdominal or chest pain. Feeling better then yesterday.   Objective: Vital signs in last 24 hours: Filed Vitals:   11/06/15 0300 11/06/15 0301 11/06/15 0429 11/06/15 0700  BP: 130/83 130/83  134/76  Pulse: 91 83  73  Temp:  97 F (36.1 C)  97.2 F (36.2 C)  TempSrc:  Axillary  Axillary  Resp: 26 24  17   Height:      Weight:   144 lb 10 oz (65.6 kg)   SpO2: 100% 99%  100%   Physical Exam Pt. Was alert,and comfortable. Unable to assess his orientation as I can understand what he was trying to speak. Eyes: PERRL EENT: Left facial drop. Neck: supple, no restricted movements. No thyromegaly Chest: Normal breath sounds. No rales or wheezing. CVS; S1/S2. Regular. No murmers. ABD: Soft, non tender. BS +VE Neuro: Alert with severe  dysarthria. Follow commands. Have bilateral atrophy of intrinsic hand muscles  LUE, 0/5  RUE; 4/5 LLE: 1/5    Extremities: Skin was warm and dry. No pedal oedema. Intact pulses bilaterally.  Assessment/Plan:  1; CVA: 65 y.o male with H/O multiple stroke due to atherosclerosis. Had recent right MCA infact. Has dysarthria and dysphagia and left sided weakness. Hold his aspirin and plavix due to his recent GI bleed. Fall precautions. Continue with PT, OT and SLP  UGIB: Resolved, most probably due to peg tube insertion.Continue with protonix 40mg  BID.  Anemia: Most probably due to GI bleed. Hb stable after packed cell transfusion.Repeat CBC tomorrow to see the trend  Dysphagia; Due to his CVA . Continue peg tube feeding and SLP.  Dysarthria: Due to his CVA. Marland KitchenContinue with SLP.  Hypertension: Has H/O hypertension and was on Amlodipine10 mg daily and Enalapril 20 mg Daily at home. Currently not on of them. His BP seems under control, will keep monitoring his BP and resume his home meds.if he shows upward trend.  Diabetes:Diet controlled. Keep monitor his blood glucose.  HYPERLIPIDEMIA:  Has multiple stoke due to atherosclerosis. Continue with lipitor 40mg .     Dispo: Anticipated discharge in approximately 2-3 day(s).   LOS: 14 days   Sean Nimrod, MD 11/06/2015, 11:25 AM Pager: [phone]

## 2015-11-06 NOTE — Progress Notes (Signed)
Internal Medicine Attending  Date: 11/06/2015  Patient name: Sean Collins Medical record number: [id number] Date of birth: 1950/11/30 Age: 65 y.o. Gender: male  I saw and evaluated the patient. I reviewed the resident's note by Dr. Reesa Chew and I agree with the resident's findings and plans as documented in her progress note.  Speech pathology was at the bedside when I stopped by to see Sean Collins in the afternoon. They were initiating the swallowing evaluation. Unfortunately, he did very poorly and is felt to continue to be a severe aspiration risk. Fortunately, his hemoglobin has remained stable with the adjustment in the PEG tube resulting in tamponade. At this point, placement is going to be the main issue along with continued therapy.

## 2015-11-07 DIAGNOSIS — I63311 Cerebral infarction due to thrombosis of right middle cerebral artery: Secondary | ICD-10-CM | POA: Insufficient documentation

## 2015-11-07 LAB — GLUCOSE, CAPILLARY
GLUCOSE-CAPILLARY: 95 mg/dL (ref 65–99)
GLUCOSE-CAPILLARY: 124 mg/dL — AB (ref 65–99)
Glucose-Capillary: 118 mg/dL — ABNORMAL HIGH (ref 65–99)
Glucose-Capillary: 104 mg/dL — ABNORMAL HIGH (ref 65–99)
Glucose-Capillary: 127 mg/dL — ABNORMAL HIGH (ref 65–99)

## 2015-11-07 LAB — BASIC METABOLIC PANEL
ANION GAP: 5 (ref 5–15)
BUN: 24 mg/dL — ABNORMAL HIGH (ref 6–20)
CALCIUM: 7.4 mg/dL — AB (ref 8.9–10.3)
CHLORIDE: 112 mmol/L — AB (ref 101–111)
CO2: 27 mmol/L (ref 22–32)
Creatinine, Ser: 1.16 mg/dL (ref 0.61–1.24)
Glucose, Bld: 143 mg/dL — ABNORMAL HIGH (ref 65–99)
POTASSIUM: 4.3 mmol/L (ref 3.5–5.1)
Sodium: 144 mmol/L (ref 135–145)

## 2015-11-07 LAB — CBC
HEMATOCRIT: 26.5 % — AB (ref 39.0–52.0)
HEMOGLOBIN: 8.7 g/dL — AB (ref 13.0–17.0)
MCH: 27.8 pg (ref 26.0–34.0)
MCHC: 32.8 g/dL (ref 30.0–36.0)
MCV: 84.7 fL (ref 78.0–100.0)
Platelets: 372 10*3/uL (ref 150–400)
RBC: 3.13 MIL/uL — AB (ref 4.22–5.81)
RDW: 15 % (ref 11.5–15.5)
WBC: 12.5 10*3/uL — AB (ref 4.0–10.5)

## 2015-11-07 MED ORDER — ENALAPRIL MALEATE 20 MG PO TABS: 20.0000 mg | ORAL_TABLET | Freq: Every day | ORAL | Status: DC

## 2015-11-07 MED ORDER — ASPIRIN 81 MG PO CHEW
81.0000 mg | CHEWABLE_TABLET | Freq: Every day | ORAL | Status: DC
  Administered 2015-11-07: 81 mg
  Filled 2015-11-07: qty 1

## 2015-11-07 MED ORDER — CLOPIDOGREL BISULFATE 75 MG PO TABS
75.0000 mg | ORAL_TABLET | Freq: Every day | ORAL | Status: DC
  Administered 2015-11-07: 75 mg
  Filled 2015-11-07: qty 1

## 2015-11-07 MED ORDER — AMLODIPINE BESYLATE 10 MG PO TABS
10.0000 mg | ORAL_TABLET | Freq: Every day | ORAL | Status: DC
  Administered 2015-11-07 – 2015-11-08 (×2): 10 mg via ORAL
  Filled 2015-11-07 (×2): qty 1

## 2015-11-07 NOTE — Progress Notes (Signed)
Internal Medicine Attending  Date: 11/07/2015  Patient name: Sean Collins Medical record number: [id number] Date of birth: [birth year] Age: 65 y.o. Gender: male  I saw and evaluated the patient. I reviewed the resident's note by Dr. Arcelia Jew and I agree with the resident's findings and plans as documented in her progress note.  Sean Collins remains quite dysarthric today but is persistent in his attempts to communicate either through the oral route, through gestures, or through writing. He is otherwise stable and will be transferred to a general medical [place] without telemetry. His antiplatelet therapy will also be started. At this point, other than continued occupational, physical, and speech therapy we are working on skilled nursing facility placement for further rehabilitation.

## 2015-11-07 NOTE — Progress Notes (Signed)
Pt arrived to 5C15 @ 1545, Pt A&Ox 4, c/o pain 0/10. Pt VS taken,  Fluids runningat 75 cc/hr. Foley intact, unclamped. Pt without distress.will monitor.

## 2015-11-07 NOTE — Progress Notes (Signed)
Called report, unit number is busy. Consuelo Pandy RN

## 2015-11-07 NOTE — Progress Notes (Signed)
   Subjective: No acute events overnight. Patient remains dysarthric but is able to communicate a little better today and is able to write down his thoughts on paper. He reports mild pain around his PEG tube site. He is concerned about where he will go once he leaves the hospital.   Objective: Vital signs in last 24 hours: Filed Vitals:   11/06/15 2303 11/07/15 0505 11/07/15 0728 11/07/15 1112  BP: 134/74 141/76 161/82 139/76  Pulse: 73 72 73 67  Temp: 98.4 F (36.9 C) 97.8 F (36.6 C) 98.7 F (37.1 C)   TempSrc: Oral Axillary Axillary   Resp: 15 15 21 15   Height:      Weight:  146 lb 6.2 oz (66.4 kg)    SpO2: 100% 99% 99% 98%   Physical Exam General: alert, sitting up in bed, NAD HEENT: [place]/AT, EOMI, sclera anicteric, mucus membranes moist CV: RRR, no m/g/r Pulm: CTA bilaterally, breaths non-labored Abd: BS+, soft, non-tender, non-distended Ext: warm, no edema. Unable to lift left upper extremity. Neuro: alert and oriented x 3. Dysarthric. Strength LUE 0/5, all other extremities 5/5 Skin: PEG tube site appears clean, dressed. No erythema surrounding. No tenderness to palpation.  Assessment/Plan:  CVA: Hx multiple strokes with most recent right MCA territory infarct on 6/17. He has significant dysarthria and dysphagia from this most recent stroke. However, he has been working with PT, OT, and SLP and doing well. He will need SNF placement and further rehab as an outpatient. I spoke with Dr. Benson Norway from GI and he agrees that his aspirin and Plavix should be restarted as the benefits outweigh the risks. His bleeding was related to his PEG tube placement and not a spontaneous bleed. I agree with Dr. Benson Norway that we are better at handling bleeding than strokes. Will await social work's help in finding a SNF. - Transfer to floor today - Start ASA and Plavix - SW consulted for SNF placement - Continue risk factor modification- controlling BP and glucose management - Continue Atorvastatin 40  mg daily  - Continue PT, OT, SLP - Continue tube feeds, keep NPO  Recent UGIB: Secondary to PEG tube placement on 6/27. He is no longer bleeding. Hgb stable in 9 range.  - Continue Protonix 40 mg BID - CBC in AM  HTN: BPs stable in 130s-160s. Will restart home Amlodipine. - Start Amlodipine 10 mg daily   Type 2 DM: Diet-controlled. Blood sugars excellent here in 90s-120s.  - Continue to monitor on bmets   Hyperlipidemia:  - Continue Atorvastatin 40 mg daily   Diet: NPO. Getting tube feeds at 75 ml/hr (at goal) VTE PPx: SCDs Dispo: Anticipated discharge in approximately 2-3 day(s). Will need placement at SNF.   LOS: 15 days   Juliet Rude, MD 11/07/2015, 11:22 AM Pager: [phone]

## 2015-11-08 DIAGNOSIS — Z931 Gastrostomy status: Secondary | ICD-10-CM | POA: Diagnosis not present

## 2015-11-08 DIAGNOSIS — E785 Hyperlipidemia, unspecified: Secondary | ICD-10-CM | POA: Diagnosis not present

## 2015-11-08 DIAGNOSIS — E1121 Type 2 diabetes mellitus with diabetic nephropathy: Secondary | ICD-10-CM | POA: Diagnosis not present

## 2015-11-08 DIAGNOSIS — F4320 Adjustment disorder, unspecified: Secondary | ICD-10-CM | POA: Diagnosis not present

## 2015-11-08 DIAGNOSIS — R471 Dysarthria and anarthria: Secondary | ICD-10-CM | POA: Diagnosis not present

## 2015-11-08 DIAGNOSIS — E1159 Type 2 diabetes mellitus with other circulatory complications: Secondary | ICD-10-CM | POA: Diagnosis not present

## 2015-11-08 DIAGNOSIS — R5381 Other malaise: Secondary | ICD-10-CM | POA: Diagnosis not present

## 2015-11-08 DIAGNOSIS — M7502 Adhesive capsulitis of left shoulder: Secondary | ICD-10-CM | POA: Diagnosis not present

## 2015-11-08 DIAGNOSIS — Z978 Presence of other specified devices: Secondary | ICD-10-CM | POA: Diagnosis not present

## 2015-11-08 DIAGNOSIS — I6521 Occlusion and stenosis of right carotid artery: Secondary | ICD-10-CM | POA: Diagnosis not present

## 2015-11-08 DIAGNOSIS — I10 Essential (primary) hypertension: Secondary | ICD-10-CM | POA: Diagnosis not present

## 2015-11-08 DIAGNOSIS — I69322 Dysarthria following cerebral infarction: Secondary | ICD-10-CM | POA: Diagnosis not present

## 2015-11-08 DIAGNOSIS — G8194 Hemiplegia, unspecified affecting left nondominant side: Secondary | ICD-10-CM | POA: Diagnosis not present

## 2015-11-08 DIAGNOSIS — M109 Gout, unspecified: Secondary | ICD-10-CM | POA: Diagnosis not present

## 2015-11-08 DIAGNOSIS — E782 Mixed hyperlipidemia: Secondary | ICD-10-CM | POA: Diagnosis not present

## 2015-11-08 DIAGNOSIS — M25512 Pain in left shoulder: Secondary | ICD-10-CM | POA: Diagnosis present

## 2015-11-08 DIAGNOSIS — Z8719 Personal history of other diseases of the digestive system: Secondary | ICD-10-CM | POA: Diagnosis not present

## 2015-11-08 DIAGNOSIS — I69391 Dysphagia following cerebral infarction: Secondary | ICD-10-CM | POA: Diagnosis present

## 2015-11-08 DIAGNOSIS — I6789 Other cerebrovascular disease: Secondary | ICD-10-CM | POA: Diagnosis not present

## 2015-11-08 DIAGNOSIS — I69921 Dysphasia following unspecified cerebrovascular disease: Secondary | ICD-10-CM | POA: Diagnosis not present

## 2015-11-08 DIAGNOSIS — Z431 Encounter for attention to gastrostomy: Secondary | ICD-10-CM | POA: Diagnosis not present

## 2015-11-08 DIAGNOSIS — D72829 Elevated white blood cell count, unspecified: Secondary | ICD-10-CM | POA: Diagnosis not present

## 2015-11-08 DIAGNOSIS — I6523 Occlusion and stenosis of bilateral carotid arteries: Secondary | ICD-10-CM | POA: Diagnosis present

## 2015-11-08 DIAGNOSIS — Z79899 Other long term (current) drug therapy: Secondary | ICD-10-CM | POA: Diagnosis not present

## 2015-11-08 DIAGNOSIS — Z8673 Personal history of transient ischemic attack (TIA), and cerebral infarction without residual deficits: Secondary | ICD-10-CM | POA: Diagnosis not present

## 2015-11-08 DIAGNOSIS — R7303 Prediabetes: Secondary | ICD-10-CM | POA: Diagnosis not present

## 2015-11-08 DIAGNOSIS — I63411 Cerebral infarction due to embolism of right middle cerebral artery: Secondary | ICD-10-CM | POA: Diagnosis not present

## 2015-11-08 DIAGNOSIS — E440 Moderate protein-calorie malnutrition: Secondary | ICD-10-CM | POA: Diagnosis not present

## 2015-11-08 DIAGNOSIS — D500 Iron deficiency anemia secondary to blood loss (chronic): Secondary | ICD-10-CM | POA: Diagnosis not present

## 2015-11-08 DIAGNOSIS — I63311 Cerebral infarction due to thrombosis of right middle cerebral artery: Secondary | ICD-10-CM | POA: Diagnosis not present

## 2015-11-08 DIAGNOSIS — Z720 Tobacco use: Secondary | ICD-10-CM | POA: Diagnosis not present

## 2015-11-08 DIAGNOSIS — K922 Gastrointestinal hemorrhage, unspecified: Secondary | ICD-10-CM | POA: Diagnosis not present

## 2015-11-08 DIAGNOSIS — R1312 Dysphagia, oropharyngeal phase: Secondary | ICD-10-CM | POA: Diagnosis not present

## 2015-11-08 DIAGNOSIS — R05 Cough: Secondary | ICD-10-CM | POA: Diagnosis not present

## 2015-11-08 DIAGNOSIS — E119 Type 2 diabetes mellitus without complications: Secondary | ICD-10-CM | POA: Diagnosis not present

## 2015-11-08 DIAGNOSIS — R1310 Dysphagia, unspecified: Secondary | ICD-10-CM | POA: Diagnosis present

## 2015-11-08 DIAGNOSIS — E43 Unspecified severe protein-calorie malnutrition: Secondary | ICD-10-CM | POA: Diagnosis not present

## 2015-11-08 DIAGNOSIS — F329 Major depressive disorder, single episode, unspecified: Secondary | ICD-10-CM | POA: Diagnosis not present

## 2015-11-08 DIAGNOSIS — I639 Cerebral infarction, unspecified: Secondary | ICD-10-CM | POA: Diagnosis not present

## 2015-11-08 DIAGNOSIS — I6990 Unspecified sequelae of unspecified cerebrovascular disease: Secondary | ICD-10-CM | POA: Diagnosis not present

## 2015-11-08 DIAGNOSIS — M6281 Muscle weakness (generalized): Secondary | ICD-10-CM | POA: Diagnosis not present

## 2015-11-08 LAB — GLUCOSE, CAPILLARY
GLUCOSE-CAPILLARY: 100 mg/dL — AB (ref 65–99)
GLUCOSE-CAPILLARY: 125 mg/dL — AB (ref 65–99)
GLUCOSE-CAPILLARY: 135 mg/dL — AB (ref 65–99)
GLUCOSE-CAPILLARY: 87 mg/dL (ref 65–99)
Glucose-Capillary: 114 mg/dL — ABNORMAL HIGH (ref 65–99)

## 2015-11-08 LAB — CBC
HEMATOCRIT: 28.5 % — AB (ref 39.0–52.0)
Hemoglobin: 9 g/dL — ABNORMAL LOW (ref 13.0–17.0)
MCH: 26.9 pg (ref 26.0–34.0)
MCHC: 31.6 g/dL (ref 30.0–36.0)
MCV: 85.1 fL (ref 78.0–100.0)
PLATELETS: 424 10*3/uL — AB (ref 150–400)
RBC: 3.35 MIL/uL — ABNORMAL LOW (ref 4.22–5.81)
RDW: 14.9 % (ref 11.5–15.5)
WBC: 12.9 10*3/uL — AB (ref 4.0–10.5)

## 2015-11-08 MED ORDER — JEVITY 1.2 CAL PO LIQD: 1000.0000 mL | ORAL | Status: DC

## 2015-11-08 MED ORDER — PANTOPRAZOLE SODIUM 40 MG PO PACK: 40.0000 mg | PACK | Freq: Two times a day (BID) | ORAL | Status: DC

## 2015-11-08 NOTE — Clinical Social Work Placement (Signed)
   CLINICAL SOCIAL WORK PLACEMENT  NOTE  Date:  11/08/2015  Patient Details  Name: Sean Collins MRN: [id number] Date of Birth: 06-11-1950  Clinical Social Work is seeking post-discharge placement for this patient at the [place] level of care (*CSW will initial, date and re-position this form in  chart as items are completed):  Yes   Patient/family provided with [place] Work Department's list of facilities offering this level of care within the geographic area requested by the patient (or if unable, by the patient's family).  Yes   Patient/family informed of their freedom to choose among providers that offer the needed level of care, that participate in Medicare, Medicaid or managed care program needed by the patient, have an available bed and are willing to accept the patient.  Yes   Patient/family informed of [facility]'s ownership interest in [facility] and [facility], as well as of the fact that they are under no obligation to receive care at these facilities.  PASRR submitted to EDS on 11/01/15     PASRR number received on 11/01/15     Existing PASRR number confirmed on       FL2 transmitted to all facilities in geographic area requested by pt/family on 11/01/15     FL2 transmitted to all facilities within larger geographic area on       Patient informed that his/her managed care company has contracts with or will negotiate with certain facilities, including the following:        Yes   Patient/family informed of bed offers received.  Patient chooses bed at [facility] recommends and patient chooses bed at      Patient to be transferred to [facility] on 11/08/15.  Patient to be transferred to facility by [facility]     Patient family notified on 11/08/15 of transfer.  Name of family member notified:  Wayne at bedside     PHYSICIAN       Additional Comment:     _______________________________________________ Benard Halsted, [place] 11/08/2015, 4:30 PM

## 2015-11-08 NOTE — Progress Notes (Signed)
Patient will DC to: [facility] Anticipated DC date: 11/08/15 Family notified: Patrick Jupiter, son Transport by: Corey Harold   Per MD patient ready for DC to [facility]. RN, patient, patient's family, and facility notified of DC. RN given number for report. DC packet on chart. Ambulance transport requested for patient.   CSW signing off.  Cedric Fishman, [place] Social Worker [phone]

## 2015-11-08 NOTE — Care Management Note (Signed)
Case Management Note  Patient Details  Name: Sean Collins MRN: [id number] Date of Birth: [birth year]  Subjective/Objective:                    Action/Plan: Plan is for patient to discharge to [facility] today. No further needs per CM.   Expected Discharge Date:                  Expected Discharge Plan:  Skilled Nursing Facility  In-House Referral:  Clinical Social Work  Discharge planning Services  CM Consult  Post Acute Care Choice:    Choice offered to:     DME Arranged:    DME Agency:     HH Arranged:    [place] Agency:     Status of Service:  In process, will continue to follow  If discussed at Long Length of Stay Meetings, dates discussed:    Additional Comments:  Pollie Friar, RN 11/08/2015, 3:36 PM

## 2015-11-08 NOTE — Progress Notes (Signed)
RN spoke to Dr. Reesa Chew, pt is not receiving any medication through PICC Line at this time, line d/c'ed per IV team at this time.   Ave Filter, RN

## 2015-11-08 NOTE — Progress Notes (Signed)
Internal Medicine Attending  Date: 11/08/2015  Patient name: Sean Collins Medical record number: [id number] Date of birth: 1951/02/18 Age: 65 y.o. Gender: male  I saw and evaluated the patient. I reviewed the resident's note by Dr. Reesa Chew and I agree with the resident's findings and plans as documented in her progress note.  Mr. Puff had a bloody bowel movement last night. This was after restarting the aspirin and Plavix. He remained hemodynamically stable and his hemoglobin was actually elevated today compared to yesterday. Therefore it is likely this was a lower GI bleed, specifically a hemorrhoidal bleed. I doubt that it was from the PEG tube insertion site given the color and the fact that he remained hemodynamically stable. We held the aspirin and Plavix because of the bright red blood per rectum although we will start the aspirin at discharge. At follow-up with neurology they can decide whether or not dual antiplatelet therapy is required. He has a skilled nursing bed available today and will be discharged to there for further rehabilitation as he is otherwise medically stable.

## 2015-11-08 NOTE — Progress Notes (Signed)
   Subjective: Pt. Was feeling better. Trying to talk but it was hard to understand. He had one episode of bloody BM last night. Objective: Vital signs in last 24 hours: Filed Vitals:   11/08/15 0133 11/08/15 0525 11/08/15 0633 11/08/15 1107  BP: 157/78 135/89  143/87  Pulse: 69 68  72  Temp: 97.9 F (36.6 C) 97.9 F (36.6 C)  98.3 F (36.8 C)  TempSrc: Axillary Oral  Oral  Resp: 18 18  18   Height:      Weight:   149 lb 9.6 oz (67.858 kg)   SpO2: 100% 100%  100%   Physical Exam  Pt. Was alert,and comfortable. Unable to assess his orientation as I can understand what he was trying to speak. EENT: Left facial drop. Neck: supple, no restricted movements. No thyromegaly Chest: Normal breath sounds. No rales or wheezing. CVS; S1/S2. Regular. No murmers. ABD: Soft, non tender. BS +VE Neuro: Alert with severe dysarthria. Follow commands. Have bilateral atrophy of intrinsic hand muscles LUE, 0/5 RUE; 4/5 LLE: 1/5  Extremities: Skin was warm and dry. No pedal oedema. Intact pulses bilaterally.  LABS: Hb. 9.0 Improved from yesterday  MEDS: Reviewed. ASA and Plavix hold because of his recent bloody BM.  Assessment/Plan:  1; CVA: 65 y.o male with H/O multiple stroke due to atherosclerosis. Had recent right MCA infact. Has dysarthria and dysphagia and left sided weakness. Hold his aspirin and plavix due to his recent GI bleed. Fall precautions. Continue with PT, OT and SLP Discharge to SNF today.  BLOODY BM:Had one episode last night after starting him back on ASA and Plavix. We will hold them today and then D/C him with just ASA and F/O with Neurology and they can decide if he really needs plavix.  UGIB: Resolved, most probably due to peg tube insertion.Continue with protonix 40mg  BID.  Anemia: Most probably due to GI bleed. Hb stable after packed cell transfusion.Repeat CBC tomorrow to see the trend  Dysphagia; Due to his CVA . Continue peg tube feeding and  SLP.  Dysarthria: Due to his CVA. Marland KitchenContinue with SLP.  Hypertension: Has H/O hypertension and was on Amlodipine10 mg daily and Enalapril 20 mg Daily at home. Currently not on of them. His BP seems under control, will keep monitoring his BP and resume his home meds. On discharge.  Diabetes:Diet controlled. Keep monitor his blood glucose.  HYPERLIPIDEMIA: Has multiple stoke due to atherosclerosis. Continue with lipitor 40mg .   Pt. Is going to [facility](SNF) today;  LOS: 16 days   Lorella Nimrod, MD 11/08/2015, 1:33 PM Pager: [phone]

## 2015-11-08 NOTE — Progress Notes (Signed)
RN received report from night shift RN that patient had a bloody stool last shift (please see I&O for description). RN notified rounding MD and MD wants RN to hold aspirin and plavix until further instruction.    Raeanna Soberanes, RN.

## 2015-11-08 NOTE — Progress Notes (Signed)
Report given to Nurse Butch Penny at [facility]. Family aware of disposition. Family at bedside already take belongings and bring to [facility]. Awaiting for transport.   Ave Filter, RN

## 2015-11-08 NOTE — Progress Notes (Signed)
Occupational Therapy Treatment Patient Details Name: Sean Collins MRN: [id number] DOB: 1950/12/28 Today's Date: 11/08/2015    History of present illness 65 yo male presents with dysarthria. MRI-Acute RIGHT MCA territory lenticulostriate infarct 11/01/15 PEG placement 6/28 EGD PMHx-CVA in September 2016 where MRI showed multiple acute tiny right posterior frontal lobe infarcts in the MCA distribution, multiple CVAs, Lt hip fx, HTN, HLD, diet controlled T2DM, and tobacco use   OT comments  Pt progressing and able to transfer to chair this session. Pt able to write to therapist and show good cognitive insight this session. Changed recommendations to CIR.    Follow Up Recommendations  CIR    Equipment Recommendations  None recommended by OT    Recommendations for Other Services Rehab consult    Precautions / Restrictions Precautions Precautions: Fall Restrictions Weight Bearing Restrictions: No       Mobility Bed Mobility Overal bed mobility: Needs Assistance Bed Mobility: Rolling;Sidelying to Sit Rolling: Min assist Sidelying to sit: +2 for physical assistance;Mod assist       General bed mobility comments: max tactile dues, limited L UE functional use during transfer, assist for trunk elevation  Transfers Overall transfer level: Needs assistance Equipment used:  (2 person lift with gait belt) Transfers: Sit to/from Stand Sit to Stand: +2 physical assistance;Mod assist Stand pivot transfers: Mod assist;+2 physical assistance       General transfer comment: modA to advance L LE during std pvt to chair, L knee blocked by PT to prevent buckling during stance phase    Balance Overall balance assessment: Needs assistance Sitting-balance support: Single extremity supported Sitting balance-Leahy Scale: Poor Sitting balance - Comments: pt sat EOB x 10 min with varying assist from contact guard to modA pending task Postural control: Left lateral lean Standing balance  support: Bilateral upper extremity supported Standing balance-Leahy Scale: Poor Standing balance comment: requires external support                    ADL Overall ADL's : Needs assistance/impaired Eating/Feeding: NPO   Grooming: Supervision/safety;Sitting Grooming Details (indicate cue type and reason): able to wipe mouth with R hand                 Toilet Transfer: +2 for physical assistance;Moderate assistance             General ADL Comments: Pt supine on arrival and agreeable to OOB. pt read a letter from a friend and pt reports how he knows sender and his name. pt able to write down needs. Pt writes "i want to learn how to do for myself" Pt reports L hand pain with tactile input. Pt writing family phone number down correctly to have patient call family. pt demonstrates expressive deficits and written language more effective      Vision                     Perception     Praxis      Cognition   Behavior During Therapy: Flat affect Overall Cognitive Status: Within Functional Limits for tasks assessed                       Extremity/Trunk Assessment               Exercises     Shoulder Instructions       General Comments      Pertinent Vitals/ Pain       Pain  Assessment: Faces Faces Pain Scale: Hurts little more Pain Descriptors / Indicators: Grimacing Pain Intervention(s): Monitored during session  Home Living                                          Prior Functioning/Environment              Frequency Min 2X/week     Progress Toward Goals  OT Goals(current goals can now be found in the care plan section)  Progress towards OT goals: Progressing toward goals  Acute Rehab OT Goals Patient Stated Goal: none stated OT Goal Formulation: With patient Time For Goal Achievement: 11/17/15 Potential to Achieve Goals: Good ADL Goals Pt Will Perform Grooming: with min assist;sitting Pt Will  Perform Upper Body Bathing: with min assist;sitting Pt Will Perform Upper Body Dressing: with min assist;sitting Pt Will Perform Lower Body Dressing: with mod assist;sit to/from stand Pt Will Transfer to Toilet: with mod assist;ambulating;bedside commode Pt Will Perform Toileting - Clothing Manipulation and hygiene: with mod assist;sit to/from stand Additional ADL Goal #1: Pt will independently perform HEP for LUE to increase strength and coordination.  Plan Discharge plan remains appropriate    Co-evaluation    PT/OT/SLP Co-Evaluation/Treatment: Yes Reason for Co-Treatment: Complexity of the patient's impairments (multi-system involvement);For patient/therapist safety          End of Session Equipment Utilized During Treatment: Gait belt   Activity Tolerance Patient tolerated treatment well   Patient Left in bed;with call bell/phone within reach;with bed alarm set;with SCD's reapplied;with nursing/sitter in room   Nurse Communication Mobility status;Precautions        Time: [id number] OT Time Calculation (min): 23 min  Charges: OT General Charges $OT Visit: 1 Procedure OT Treatments $Therapeutic Activity: 8-22 mins  Parke Poisson B 11/08/2015, 2:10 PM  Jeri Modena   OTR/L Pager: [phone] Office: [phone] .

## 2015-11-08 NOTE — Progress Notes (Signed)
Physical Therapy Treatment Patient Details Name: Sean Collins MRN: [id number] DOB: Sep 08, 1950 Today's Date: 11/08/2015    History of Present Illness 65 yo male presents with dysarthria. MRI-Acute RIGHT MCA territory lenticulostriate infarct 11/01/15 PEG placement 6/28 EGD PMHx-CVA in September 2016 where MRI showed multiple acute tiny right posterior frontal lobe infarcts in the MCA distribution, multiple CVAs, Lt hip fx, HTN, HLD, diet controlled T2DM, and tobacco use    PT Comments    Pt with improved ability to achieve sitting balance and was able to take steps to chair this date with maxAx2. Pt very motivated and reports "im trying to teach myself." pt to strongly benefit from CIR upon d/c for maximal functional recovery.  Follow Up Recommendations  CIR     Equipment Recommendations       Recommendations for Other Services Rehab consult     Precautions / Restrictions Precautions Precautions: Fall Restrictions Weight Bearing Restrictions: No    Mobility  Bed Mobility Overal bed mobility: Needs Assistance Bed Mobility: Rolling;Sidelying to Sit Rolling: Min assist Sidelying to sit: +2 for physical assistance;Mod assist       General bed mobility comments: max tactile dues, limited L UE functional use during transfer, assist for trunk elevation  Transfers Overall transfer level: Needs assistance Equipment used:  (2 person lift with gait belt) Transfers: Sit to/from Stand;Stand Pivot Transfers Sit to Stand: +2 physical assistance;Mod assist Stand pivot transfers: Mod assist;+2 physical assistance       General transfer comment: modA to advance L LE during std pvt to chair, L knee blocked by PT to prevent buckling during stance phase  Ambulation/Gait                 Stairs            Wheelchair Mobility    Modified Rankin (Stroke Patients Only) Modified Rankin (Stroke Patients Only) Pre-Morbid Rankin Score: No significant  disability Modified Rankin: Severe disability     Balance Overall balance assessment: Needs assistance Sitting-balance support: Single extremity supported Sitting balance-Leahy Scale: Poor Sitting balance - Comments: pt sat EOB x 10 min with varying assist from contact guard to modA pending task Postural control: Left lateral lean Standing balance support: Bilateral upper extremity supported Standing balance-Leahy Scale: Poor Standing balance comment: requires external support                    Cognition Arousal/Alertness: Awake/alert Behavior During Therapy: Flat affect Overall Cognitive Status: Difficult to assess (pt answers questions appropriately, and writes appropriately)                      Exercises      General Comments        Pertinent Vitals/Pain Pain Assessment: Faces Faces Pain Scale: Hurts little more Pain Descriptors / Indicators: Grimacing (L hand painful to touch) Pain Intervention(s): Monitored during session    Home Living                      Prior Function            PT Goals (current goals can now be found in the care plan section) Acute Rehab PT Goals Patient Stated Goal: none stated Progress towards PT goals: Progressing toward goals    Frequency  Min 4X/week    PT Plan Discharge plan needs to be updated;Frequency needs to be updated    Co-evaluation PT/OT/SLP Co-Evaluation/Treatment: Yes Reason for Co-Treatment: Complexity of  the patient's impairments (multi-system involvement)         End of Session Equipment Utilized During Treatment: Gait belt Activity Tolerance: Patient tolerated treatment well Patient left: in chair;with call bell/phone within reach;with chair alarm set     Time: [id number] PT Time Calculation (min) (ACUTE ONLY): 27 min  Charges:  $Therapeutic Activity: 8-22 mins                    G Codes:      Kingsley Callander 11/08/2015, 1:32 PM   Kittie Plater, PT, DPT Pager #:  [phone] Office #: [phone]

## 2015-11-10 ENCOUNTER — Encounter: Payer: Self-pay | Admitting: Nurse Practitioner

## 2015-11-10 ENCOUNTER — Non-Acute Institutional Stay (SKILLED_NURSING_FACILITY): Payer: Medicare Other | Admitting: Nurse Practitioner

## 2015-11-10 DIAGNOSIS — M109 Gout, unspecified: Secondary | ICD-10-CM | POA: Diagnosis not present

## 2015-11-10 DIAGNOSIS — I10 Essential (primary) hypertension: Secondary | ICD-10-CM

## 2015-11-10 DIAGNOSIS — I69921 Dysphasia following unspecified cerebrovascular disease: Secondary | ICD-10-CM | POA: Diagnosis not present

## 2015-11-10 DIAGNOSIS — I69391 Dysphagia following cerebral infarction: Secondary | ICD-10-CM

## 2015-11-10 DIAGNOSIS — E785 Hyperlipidemia, unspecified: Secondary | ICD-10-CM | POA: Diagnosis not present

## 2015-11-10 DIAGNOSIS — E440 Moderate protein-calorie malnutrition: Secondary | ICD-10-CM

## 2015-11-10 DIAGNOSIS — K922 Gastrointestinal hemorrhage, unspecified: Secondary | ICD-10-CM

## 2015-11-10 DIAGNOSIS — G8194 Hemiplegia, unspecified affecting left nondominant side: Secondary | ICD-10-CM | POA: Diagnosis not present

## 2015-11-10 DIAGNOSIS — Z8673 Personal history of transient ischemic attack (TIA), and cerebral infarction without residual deficits: Secondary | ICD-10-CM | POA: Diagnosis not present

## 2015-11-10 NOTE — Progress Notes (Signed)
Nursing Home Location:  [facility]  Place of Service: SNF (31)  PCP: Default, Provider, MD  No Known Allergies  Chief Complaint  Patient presents with  . Hospitalization Follow-up    [facility] 10/23/15 until 11/08/15    HPI:  Patient is a 65 y.o. male seen today at [facility] to follow up hospitalization. Pt with hx of CVA, arthrosclerosis, CAD, htn, DM, hyperlipidemia.  Pt with multiple stroke history due to atherosclerosis. Had recent right MCA infarct and now with dysarthria and dysphagia and left sided weakness. Pt currently on ASA only after GI bleed and plavix is on hold.  bably due to peg tube insertion.Continue with protonix 40mg  BID.  Pt is now at [facility] for PT/OT/ST  Review of Systems:  Review of Systems  Unable to perform ROS: Other    Past Medical History  Diagnosis Date  . Hypertension   . Hyperlipidemia   . Stroke ([place])   . Diabetes mellitus without complication ([place])   . GI bleed   . Dysphagia   . Dysarthria    Past Surgical History  Procedure Laterality Date  . Esophagogastroduodenoscopy (egd) with propofol N/A 11/03/2015    Procedure: ESOPHAGOGASTRODUODENOSCOPY (EGD) WITH PROPOFOL;  Surgeon: Manus Gunning, MD;  Location: [facility];  Service: Gastroenterology;  Laterality: N/A;   Social History:   reports that he has been smoking Cigarettes.  He has a 16 pack-year smoking history. He does not have any smokeless tobacco history on file. He reports that he does not drink alcohol or use illicit drugs.  Family History  Problem Relation Age of Onset  . Diabetes Brother     Medications: Patient's Medications  New Prescriptions   No medications on file  Previous Medications   ACETAMINOPHEN (TYLENOL) 650 MG CR TABLET    Take 650 mg by mouth every 4 (four) hours as needed for pain or fever.   ALLOPURINOL (ZYLOPRIM) 300 MG TABLET    Take 300 mg by mouth daily.   AMLODIPINE (NORVASC) 10 MG TABLET    Take 1 tablet (10 mg total) by  mouth daily.   ASPIRIN EC 325 MG EC TABLET    Take 1 tablet (325 mg total) by mouth daily.   ATENOLOL (TENORMIN) 50 MG TABLET    Take 1 tablet (50 mg total) by mouth 2 (two) times daily.   ATORVASTATIN (LIPITOR) 40 MG TABLET    Take 1 tablet (40 mg total) by mouth daily at 6 PM.   CARBOXYMETHYLCELLULOSE (REFRESH PLUS) 0.5 % SOLN    Place 1 drop into both eyes 3 (three) times daily as needed (dry eyes).   CHOLECALCIFEROL (VITAMIN D) 1000 UNITS TABLET    Take 1,000 Units by mouth daily.   ENALAPRIL (VASOTEC) 20 MG TABLET    Take 1 tablet (20 mg total) by mouth daily.   FOLIC ACID (FOLVITE) 1 MG TABLET    Take 1 mg by mouth daily.   NUTRITIONAL SUPPLEMENTS (FIBERSOURCE) LIQD    Give 65 ml/hour continuously via pump via PEG to provide 1812 calories per day.   PANTOPRAZOLE SODIUM (PROTONIX) 40 MG/20 ML PACK    Place 20 mLs (40 mg total) into feeding tube 2 (two) times daily.  Modified Medications   No medications on file  Discontinued Medications   NUTRITIONAL SUPPLEMENTS (FEEDING SUPPLEMENT, JEVITY 1.2 CAL,) LIQD    Place 1,000 mLs into feeding tube continuous.     Physical Exam: Filed Vitals:   11/10/15 1050  BP: 115/67  Pulse: 66  Temp: 99.1 F (37.3 C)  TempSrc: Oral  Resp: 20  Height: 5\' 9"  (1.753 m)  Weight: 149 lb (67.586 kg)  SpO2: 95%    Physical Exam  Constitutional: He appears well-developed and well-nourished. No distress.  HENT:  Head: Normocephalic and atraumatic.  Mouth/Throat: Oropharynx is clear and moist. No oropharyngeal exudate.  Left sided facial droop  Eyes: Conjunctivae and EOM are normal. Pupils are equal, round, and reactive to light.  Neck: Normal range of motion. Neck supple.  Cardiovascular: Normal rate, regular rhythm and normal heart sounds.   Pulmonary/Chest: Effort normal and breath sounds normal.  Abdominal: Soft. Bowel sounds are normal.  PEG tube intact   Musculoskeletal: He exhibits no edema or tenderness.  Neurological: He is alert.  Left  sided hemiplegia    Skin: Skin is warm and dry. He is not diaphoretic.  Psychiatric: He has a normal mood and affect.    Labs reviewed: Basic Metabolic Panel:  Recent Labs  11/04/15 0416 11/05/15 0614 11/06/15 0515 11/07/15 0520  NA 144 144 145 144  K 4.0 3.7 3.7 4.3  CL 115* 112* 113* 112*  CO2 22 24 24 27   GLUCOSE 99 101* 128* 143*  BUN 42* 28* 26* 24*  CREATININE 1.45* 1.26* 1.35* 1.16  CALCIUM 8.6* 9.0 9.0 7.4*  MG 2.0 2.0 2.2  --   PHOS 3.8 3.7 3.8  --    Liver Function Tests:  Recent Labs  01/22/15 1757 10/23/15 1442 11/04/15 0416 11/05/15 0614 11/06/15 0515  AST 24 23  --   --   --   ALT 16* 17  --   --   --   ALKPHOS 84 71  --   --   --   BILITOT 0.4 0.4  --   --   --   PROT 7.4 7.1  --   --   --   ALBUMIN 3.3* 3.4* 2.2* 2.5* 2.4*   No results for input(s): LIPASE, AMYLASE in the last 8760 hours. No results for input(s): AMMONIA in the last 8760 hours. CBC:  Recent Labs  11/04/15 0416  11/05/15 0614  11/06/15 0515 11/07/15 0520 11/08/15 0526  WBC 12.5*  < > 15.8*  --  15.7* 12.5* 12.9*  NEUTROABS 8.7*  --  12.1*  --  12.4*  --   --   HGB 6.5*  < > 9.4*  < > 9.3* 8.7* 9.0*  HCT 20.3*  < > 28.6*  < > 28.4* 26.5* 28.5*  MCV 83.5  < > 82.7  --  85.3 84.7 85.1  PLT 267  < > 303  --  322 372 424*  < > = values in this interval not displayed. TSH: No results for input(s): TSH in the last 8760 hours. A1C: Lab Results  Component Value Date   HGBA1C 6.1* 10/23/2015   Lipid Panel:  Recent Labs  01/23/15 0932 10/24/15 0502  CHOL 233* 138  HDL 41 43  LDLCALC 180* 83  TRIG 62 59  CHOLHDL 5.7 3.2    Radiological Exams: Ct Head Wo Contrast  10/23/2015  CLINICAL DATA:  Patient with sudden onset slurred speech and dizziness. Code stroke. EXAM: CT HEAD WITHOUT CONTRAST TECHNIQUE: Contiguous axial images were obtained from the base of the skull through the vertex without intravenous contrast. COMPARISON:  MRI brain 01/23/2015; CT brain 01/22/2015.  FINDINGS: Ventricles and sulci are prominent for patient's age. Old left frontal lobe encephalomalacia. Periventricular and subcortical white matter hypodensity compatible with  chronic microvascular ischemic changes. No evidence for large territory infarct, intracranial hemorrhage, mass lesion or mass-effect. Orbits are unremarkable. Ethmoid air cell mucosal thickening. Mastoid air cells unremarkable. Calvarium is intact. IMPRESSION: No acute intracranial process. Chronic microvascular ischemic changes and cortical atrophy. Critical Value/emergent results were called by telephone at the time of interpretation on 10/23/2015 at 3:03 pm to Dr. Barbee Cough, who verbally acknowledged these results. Electronically Signed   By: Lovey Newcomer M.D.   On: 10/23/2015 15:12   Ct Angio Neck W Or Wo Contrast  10/24/2015  CLINICAL DATA:  Stroke.  Right MCA infarct. EXAM: CT ANGIOGRAPHY NECK TECHNIQUE: Multidetector CT imaging of the neck was performed using the standard protocol during bolus administration of intravenous contrast. Multiplanar CT image reconstructions and MIPs were obtained to evaluate the vascular anatomy. Carotid stenosis measurements (when applicable) are obtained utilizing NASCET criteria, using the distal internal carotid diameter as the denominator. CONTRAST:  50 mL Isovue 370 IV COMPARISON:  MRI head 10/23/2015 FINDINGS: Aortic arch: Atherosclerotic calcification in the aortic arch. Negative for aneurysm or dissection. Atherosclerotic calcification the proximal great vessels which are widely patent without significant stenosis. Lung apices clear. Right carotid system: Right common carotid artery widely patent. Atherosclerotic calcification proximal right internal carotid artery narrowing the lumen by 50% diameter stenosis. Additional noncalcified stenosis 15 mm distal to the bifurcation also narrowing the lumen by 50% diameter stenosis. Right external carotid artery mild stenosis proximally. Left carotid  system: Mild atherosclerotic disease in the left common carotid artery without significant stenosis. Mild atherosclerotic calcification proximal left internal carotid artery. 40% diameter stenosis proximal left internal carotid artery. Left external carotid artery widely patent. Vertebral arteries:Mild stenosis proximal right vertebral artery. Remainder of the right vertebral artery is patent to the basilar. Mild stenosis proximal left vertebral artery without additional stenosis. Left vertebral artery patent to the basilar. Skeleton: Disc degeneration and spondylosis. Facet degeneration. No fracture or acute skeletal abnormality. Other neck: No adenopathy in the neck. IMPRESSION: 50% diameter stenosis at the origin of the right internal carotid artery. 15 mm distally there is another 50% diameter stenosis due to noncalcified atherosclerotic plaque. 40% diameter stenosis proximal left internal carotid artery Mild stenosis at the proximal vertebral artery bilaterally. Both vertebral arteries patent. Electronically Signed   By: Franchot Gallo M.D.   On: 10/24/2015 18:02   Mr Brain Wo Contrast  10/23/2015  CLINICAL DATA:  Dysarthria which began earlier today. Associated lightheadedness and gait instability. EXAM: MRI HEAD WITHOUT CONTRAST MRA HEAD WITHOUT CONTRAST TECHNIQUE: Multiplanar, multiecho pulse sequences of the brain and surrounding structures were obtained without intravenous contrast. Angiographic images of the head were obtained using MRA technique without contrast. COMPARISON:  CT head earlier today. MRI head 01/23/2015. MRI dated 11/16/2011. FINDINGS: MRI HEAD FINDINGS There is acute, RIGHT MCA territory lenticulostriate infarct affecting the lateral lentiform nucleus, and periventricular white matter. See for instance image 33 series 3, and image 20 series 9. No associated hemorrhage. No mass lesion, or extra-axial fluid. Global atrophy. Hydrocephalus ex vacuo. Extensive chronic microvascular ischemic  change. Multiple hemorrhagic and nonhemorrhagic chronic lacunar infarcts, with scattered areas of susceptibility most notable in the RIGHT posterior lentiform nucleus, LEFT lateral lentiform nucleus, and BILATERAL subcortical white matter. Ovoid focus susceptibility LEFT frontal lobe superiorly, with encephalomalacia, can be seen as far back as 2013, potentially an area of old trauma. No midline abnormality. RIGHT cataract extraction appears uncomplicated. Possible mucocele of the LEFT ethmoid air cells, incompletely evaluated. No middle ear or mastoid fluid. MRA  HEAD FINDINGS Dolichoectatic but widely patent LEFT internal carotid artery. Mild non stenotic atheromatous change inferior cavernous segment RIGHT internal carotid artery otherwise widely patent vessel. Basilar artery minimally irregular in its proximal segment, with both vertebrals contributing, RIGHT dominant. Mild non stenotic atheromatous change above stools. Dolichoectatic, slightly irregular, but non stenotic M1 MCA segments. BILATERAL M2 and M3 vessel irregularity, consistent with intracranial atherosclerotic disease. 75% stenosis A1 ACA on the LEFT. Mild non stenotic irregularity of the RIGHT A1 ACA both distal anterior cerebral arteries are patent. No proximal posterior cerebral artery flow limiting stenosis. 75% stenosis of the proximal P2 segment on the LEFT. Mild irregularity of the LEFT greater than RIGHT superior cerebellar arteries. Anterior inferior cerebellar arteries poorly visualized. RIGHT posterior inferior cerebellar artery robust ; LEFT is moderately diseased. IMPRESSION: Acute RIGHT MCA territory lenticulostriate infarct, nonhemorrhagic. Atrophy and small vessel disease. Scattered foci of chronic hemorrhagic and nonhemorrhagic lacunar infarcts. Incompletely evaluated LEFT ethmoid air cell opacity, possible developing mucocele. No proximal large vessel occlusion or flow-limiting stenosis. Intracranial atherosclerotic change most  notable in the LEFT A1 ACA and LEFT P2 PCA, as well as BILATERAL MCA M2 and M3 vessels. Electronically Signed   By: Staci Righter M.D.   On: 10/23/2015 19:24   Mr Jodene Nam Head/brain Wo Cm  10/23/2015  CLINICAL DATA:  Dysarthria which began earlier today. Associated lightheadedness and gait instability. EXAM: MRI HEAD WITHOUT CONTRAST MRA HEAD WITHOUT CONTRAST TECHNIQUE: Multiplanar, multiecho pulse sequences of the brain and surrounding structures were obtained without intravenous contrast. Angiographic images of the head were obtained using MRA technique without contrast. COMPARISON:  CT head earlier today. MRI head 01/23/2015. MRI dated 11/16/2011. FINDINGS: MRI HEAD FINDINGS There is acute, RIGHT MCA territory lenticulostriate infarct affecting the lateral lentiform nucleus, and periventricular white matter. See for instance image 33 series 3, and image 20 series 9. No associated hemorrhage. No mass lesion, or extra-axial fluid. Global atrophy. Hydrocephalus ex vacuo. Extensive chronic microvascular ischemic change. Multiple hemorrhagic and nonhemorrhagic chronic lacunar infarcts, with scattered areas of susceptibility most notable in the RIGHT posterior lentiform nucleus, LEFT lateral lentiform nucleus, and BILATERAL subcortical white matter. Ovoid focus susceptibility LEFT frontal lobe superiorly, with encephalomalacia, can be seen as far back as 2013, potentially an area of old trauma. No midline abnormality. RIGHT cataract extraction appears uncomplicated. Possible mucocele of the LEFT ethmoid air cells, incompletely evaluated. No middle ear or mastoid fluid. MRA HEAD FINDINGS Dolichoectatic but widely patent LEFT internal carotid artery. Mild non stenotic atheromatous change inferior cavernous segment RIGHT internal carotid artery otherwise widely patent vessel. Basilar artery minimally irregular in its proximal segment, with both vertebrals contributing, RIGHT dominant. Mild non stenotic atheromatous change  above stools. Dolichoectatic, slightly irregular, but non stenotic M1 MCA segments. BILATERAL M2 and M3 vessel irregularity, consistent with intracranial atherosclerotic disease. 75% stenosis A1 ACA on the LEFT. Mild non stenotic irregularity of the RIGHT A1 ACA both distal anterior cerebral arteries are patent. No proximal posterior cerebral artery flow limiting stenosis. 75% stenosis of the proximal P2 segment on the LEFT. Mild irregularity of the LEFT greater than RIGHT superior cerebellar arteries. Anterior inferior cerebellar arteries poorly visualized. RIGHT posterior inferior cerebellar artery robust ; LEFT is moderately diseased. IMPRESSION: Acute RIGHT MCA territory lenticulostriate infarct, nonhemorrhagic. Atrophy and small vessel disease. Scattered foci of chronic hemorrhagic and nonhemorrhagic lacunar infarcts. Incompletely evaluated LEFT ethmoid air cell opacity, possible developing mucocele. No proximal large vessel occlusion or flow-limiting stenosis. Intracranial atherosclerotic change most notable in the LEFT  A1 ACA and LEFT P2 PCA, as well as BILATERAL MCA M2 and M3 vessels. Electronically Signed   By: Staci Righter M.D.   On: 10/23/2015 19:24    Assessment/Plan 1. Dysphagia due to recent stroke With PEG and maintained on tube feeding.  2. Left hemiparesis ([place]) After CVA, denies pain.   3. UGIB (upper gastrointestinal bleed) Noted prior to discharge from hospital. hgb was stable and he was discharge with just ASA- to follow up with neurology about plavix. Pt conts on protonix. Will follow up CBC  4. Gout without tophus, unspecified cause, unspecified chronicity, unspecified site Stable conts on Allopurinol   5. HLD (hyperlipidemia) LDL at 83, conts on statin   6. History of stroke Hx of multiple strokes due to atherosclerosis most recent with right sided MCA with dysarthria and dysphagia and left sided hemiparesis. At [facility] for PT/OT/ST  7. Malnutrition of moderate  degree -conts on feeing via PEG, will be followed by RD while at [facility]  8. Essential hypertension Blood pressure stable, cotns on enalapril, atenolol and norvasc    Latwan Luchsinger K. Harle Battiest  [facility] [phone] 8 am - 5 pm) [phone] (after hours)

## 2015-11-11 ENCOUNTER — Non-Acute Institutional Stay (SKILLED_NURSING_FACILITY): Payer: Medicare Other | Admitting: Internal Medicine

## 2015-11-11 ENCOUNTER — Encounter: Payer: Self-pay | Admitting: Internal Medicine

## 2015-11-11 DIAGNOSIS — D500 Iron deficiency anemia secondary to blood loss (chronic): Secondary | ICD-10-CM

## 2015-11-11 DIAGNOSIS — Z720 Tobacco use: Secondary | ICD-10-CM | POA: Diagnosis not present

## 2015-11-11 DIAGNOSIS — Z8673 Personal history of transient ischemic attack (TIA), and cerebral infarction without residual deficits: Secondary | ICD-10-CM

## 2015-11-11 DIAGNOSIS — E1121 Type 2 diabetes mellitus with diabetic nephropathy: Secondary | ICD-10-CM | POA: Diagnosis not present

## 2015-11-11 DIAGNOSIS — R5381 Other malaise: Secondary | ICD-10-CM | POA: Diagnosis not present

## 2015-11-11 DIAGNOSIS — I69391 Dysphagia following cerebral infarction: Secondary | ICD-10-CM

## 2015-11-11 DIAGNOSIS — E785 Hyperlipidemia, unspecified: Secondary | ICD-10-CM | POA: Diagnosis not present

## 2015-11-11 DIAGNOSIS — I10 Essential (primary) hypertension: Secondary | ICD-10-CM | POA: Diagnosis not present

## 2015-11-11 DIAGNOSIS — Z931 Gastrostomy status: Secondary | ICD-10-CM

## 2015-11-11 DIAGNOSIS — I69921 Dysphasia following unspecified cerebrovascular disease: Secondary | ICD-10-CM

## 2015-11-11 DIAGNOSIS — Z8719 Personal history of other diseases of the digestive system: Secondary | ICD-10-CM | POA: Diagnosis not present

## 2015-11-11 DIAGNOSIS — E43 Unspecified severe protein-calorie malnutrition: Secondary | ICD-10-CM

## 2015-11-11 DIAGNOSIS — G8194 Hemiplegia, unspecified affecting left nondominant side: Secondary | ICD-10-CM | POA: Diagnosis not present

## 2015-11-11 NOTE — Progress Notes (Signed)
Patient ID: Sean Collins, male   DOB: Nov 08, 1950, 65 y.o.   MRN: [id number]    HISTORY AND PHYSICAL   DATE: 11/11/15  Location:  Nursing Home Location: [facility]  Nursing Home Room Number: 107 Place of Service: SNF (31)   Extended Emergency Contact Information Primary Emergency Contact: Ivan An[place] Phone: [phone] Relation: Brother  Advanced Directive information Does patient have an advance directive?: No, Would patient like information on creating an advanced directive?: No - patient declined information FULL CODE Chief Complaint  Patient presents with  . New Admit To SNF    HPI:  65 yo male seen today as a new admission into SNF following prolonged hospital stay for acute right MCA lenticulostriate infarct, UGIB, dysarthria, tobacco abuse, dysphagia s/p Peg, HTN, DM, hyperlipidemia, hx CVAs, [place] veteran. He presented to the ED with dysarthria. CT head neg acute hemorrhage, mass or intracranial process; chronic ischemic microvascular changes noted. He was seen in the ED by neurology and it was determined that he was NOT and tPA candidate. MRI brain 6/17th --> acute right MCA lenticulostriate infarct. He was started on ASA/plavix. TTE showed EF 55-60% with PA pressure 69mmHg. Carotid study --> right 40-59%; left 1-39% stenosis. Stroke thought to be due to atherosclerosis. Vasc sx panned CEA prior to d/c but pt had a decline in function. CXR s/o aspiration pneumonia. NGT placed for nutritional needs/med administration. Peg tube placed by IR on 6/27th. He had melena and hematemesis -->ICU --> rec'd 2 units PRBCs. ASA/plavix held. GI consulted -->EGD showed active bleed in gastric body related to Peg site. Peg tightened and bleeding resolved. ASA/plavix resumed but he had a bloody BM that night-->both meds held x 1 day-->ASA alone resumed. Neuro to decide whether or not to restart plavix. Hgb 6.5-->9.4 post transfusion -->9 at d/c; WBC  trended as high as 17K--> 12.9; Cr 1.16; urine microalbumin/Cr ratio 1185.3; A1c 6.1%; albumin 2.4; LDL 83. He presents to SNF for short term rehab and possibly long term care.  He has no concerns today. He is a poor historian due to expressive aphasia. Hx obtained from chart. No nursing issues. No falls. Tolerating TF.   DM - diet controlled. A1c 6.1%. He has nephropathy with urine microalbumin/Cr ratio 1185.3. He takes ACEI. LDL 83 and he is on a statin  HTN - BP stable on amlodipine, enalapril, and atenolol. He takes ASA daily  Hyperlipidemia - stable on lipitor. LDL 83  Gout - no attacks recently. He takes allopurinol.  Anemia - Hgb stable at 9. He takes folate and vitamins  UGIB - on protonix.   Dysphagia - due to CVA. He is s/p Peg and receives TF  Malnutrition - severe. Albumin 2.4. He gets TF    Past Medical History  Diagnosis Date  . Hypertension   . Hyperlipidemia   . Stroke ([place])   . Diabetes mellitus without complication ([place])   . GI bleed   . Dysphagia   . Dysarthria     Past Surgical History  Procedure Laterality Date  . Esophagogastroduodenoscopy (egd) with propofol N/A 11/03/2015    Procedure: ESOPHAGOGASTRODUODENOSCOPY (EGD) WITH PROPOFOL;  Surgeon: Manus Gunning, MD;  Location: [facility];  Service: Gastroenterology;  Laterality: N/A;    Patient Care Team: Provider Default, MD as PCP - General  Social History   Social History  . Marital Status: Legally Separated    Spouse Name: N/A  . Number of Children: N/A  . Years  of Education: N/A   Occupational History  . Not on file.   Social History Main Topics  . Smoking status: Current Every Day Smoker -- 0.50 packs/day for 32 years    Types: Cigarettes  . Smokeless tobacco: Not on file     Comment: Started at age 63 though quit for 10 years at one point  . Alcohol Use: No  . Drug Use: No  . Sexual Activity: Not on file   Other Topics Concern  . 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  He has a 16 pack-year smoking history. He does not have any smokeless tobacco history on file. He reports that he does not drink alcohol or use illicit drugs.  Family History  Problem Relation Age of Onset  . Diabetes Brother    Family Status  Relation Status Death Age  . Brother Deceased     Immunization History  Administered Date(s) Administered  . PPD Test 11/08/2015    No Known Allergies  Medications: Patient's Medications  New Prescriptions   No medications on file  Previous Medications   ACETAMINOPHEN (TYLENOL) 650 MG CR TABLET    Take 650 mg by mouth every 4 (four) hours as needed for pain or fever.   ALLOPURINOL (ZYLOPRIM) 300 MG TABLET    Take 300 mg by mouth daily.   AMLODIPINE (NORVASC) 10 MG TABLET    Take 1 tablet (10 mg total) by mouth daily.   ASPIRIN EC 325 MG EC TABLET    Take 1 tablet (325 mg total) by mouth daily.   ATENOLOL (TENORMIN) 50 MG TABLET    Take 1 tablet (50 mg total) by mouth 2 (two) times daily.   ATORVASTATIN (LIPITOR) 40 MG TABLET    Take 1 tablet (40 mg total) by mouth daily at 6 PM.   CARBOXYMETHYLCELLULOSE (REFRESH PLUS) 0.5 % SOLN    Place 1 drop into both eyes 3 (three) times daily as needed (dry eyes).   CHOLECALCIFEROL (VITAMIN D) 1000 UNITS TABLET    Take 1,000 Units by mouth daily.   ENALAPRIL (VASOTEC) 20 MG TABLET    Take 1 tablet (20 mg total) by mouth daily.   FOLIC ACID (FOLVITE) 1 MG TABLET    Take 1 mg by mouth daily.   NUTRITIONAL SUPPLEMENTS (FIBERSOURCE) LIQD    Give 65 ml/hour continuously via pump via PEG to provide 1812 calories per day.   PANTOPRAZOLE SODIUM (PROTONIX) 40 MG/20 ML PACK    Place 20 mLs (40 mg total) into feeding tube 2 (two) times daily.  Modified Medications   No medications on file  Discontinued  Medications   No medications on file    Review of Systems  Unable to perform ROS: Other  expressive aphasia   Filed Vitals:   11/11/15 0924  BP: 122/68  Pulse: 63  Temp: 97.8 F (36.6 C)  TempSrc: Oral  Resp: 18  Height: 5\' 9"  (1.753 m)  Weight: 149 lb (67.586 kg)  SpO2: 92%   Body mass index is 21.99 kg/(m^2).  Physical Exam  Constitutional: He appears well-developed.  Frail appearing in NAD, sitting in w/c  HENT:  Mouth/Throat: Oropharynx is clear and moist.  Eyes: Pupils are equal, round, and reactive to light. No  scleral icterus.  Neck: Neck supple. Carotid bruit is not present. No thyromegaly present.  Cardiovascular: Normal rate, regular rhythm, normal heart sounds and intact distal pulses.  Exam reveals no gallop and no friction rub.   No murmur heard. no distal LE swelling. No calf TTP  Pulmonary/Chest: Effort normal. He has no wheezes. He has rales (left base). He exhibits no tenderness.  Abdominal: Soft. Bowel sounds are normal. He exhibits no distension, no abdominal bruit, no pulsatile midline mass and no mass. There is no hepatomegaly. There is no tenderness. There is no rebound and no guarding.  (+) Peg tube intact with TF going. No d/c or redness at insertion site  Musculoskeletal: He exhibits edema.  Left hand/fingers contracture  Lymphadenopathy:    He has no cervical adenopathy.  Neurological: He is alert. He displays atrophy. He exhibits abnormal muscle tone. Gait abnormal.  LUE hemiplegia with paresis; LLE hemiparesis; expressive aphasia  Skin: Skin is warm and dry. No rash noted.  Psychiatric: His behavior is normal. His mood appears anxious. His speech is slurred.     Labs reviewed: Admission on 10/23/2015, Discharged on 11/08/2015  No results displayed because visit has over 200 results.  CBC Latest Ref Rng 11/08/2015 11/07/2015 11/06/2015  WBC 4.0 - 10.5 K/uL 12.9(H) 12.5(H) 15.7(H)  Hemoglobin 13.0 - 17.0 g/dL 9.0(L) 8.7(L) 9.3(L)  Hematocrit  39.0 - 52.0 % 28.5(L) 26.5(L) 28.4(L)  Platelets 150 - 400 K/uL 424(H) 372 322    CMP Latest Ref Rng 11/07/2015 11/06/2015 11/05/2015  Glucose 65 - 99 mg/dL 143(H) 128(H) 101(H)  BUN 6 - 20 mg/dL 24(H) 26(H) 28(H)  Creatinine 0.61 - 1.24 mg/dL 1.16 1.35(H) 1.26(H)  Sodium 135 - 145 mmol/L 144 145 144  Potassium 3.5 - 5.1 mmol/L 4.3 3.7 3.7  Chloride 101 - 111 mmol/L 112(H) 113(H) 112(H)  CO2 22 - 32 mmol/L 27 24 24   Calcium 8.9 - 10.3 mg/dL 7.4(L) 9.0 9.0    Lipid Panel     Component Value Date/Time   CHOL 138 10/24/2015 0502   TRIG 59 10/24/2015 0502   HDL 43 10/24/2015 0502   CHOLHDL 3.2 10/24/2015 0502   VLDL 12 10/24/2015 0502   LDLCALC 83 10/24/2015 0502        Ct Abdomen Wo Contrast  11/03/2015  CLINICAL DATA:  Bleeding from peg tube insertion site. EXAM: CT ABDOMEN WITHOUT CONTRAST TECHNIQUE: Multidetector CT imaging of the abdomen was performed following the standard protocol without IV contrast. COMPARISON:  CT scan 10/27/2015 FINDINGS: Lower chest: The lung bases demonstrate dependent atelectasis/ edema. Stable peribronchial thickening and probable scarring changes. No pleural effusion. The heart is normal in size. Extensive coronary artery calcifications. Hepatobiliary: No focal hepatic lesions. The gallbladder appears normal. No common bile duct dilatation. Pancreas: No mass, inflammation or ductal dilatation. Spleen: Normal size.  No focal lesions. Adrenals/Urinary Tract: The adrenal glands and kidneys are grossly normal. Stomach/Bowel: The feeding tube is normally positioned in the antral region of the stomach. No complicating features are demonstrated. No surrounding fluid collections or hematoma. The transverse colon is normal. The small bowel is unremarkable. Vascular/Lymphatic: Advanced atherosclerotic calcifications involving the aorta and branch vessels. No aneurysm. IVC filter is noted. No mesenteric or retroperitoneal mass or adenopathy. Other: No ascites or free air.  Musculoskeletal: No significant bony findings. IMPRESSION: Peg tube appears to be in good position without complicating features. No acute abdominal findings. Electronically Signed   By: Marijo Sanes M.D.   On: 11/03/2015 12:05   Ct  Abdomen Wo Contrast  10/27/2015  CLINICAL DATA:  Evaluate for gastrostomy tube EXAM: CT ABDOMEN WITHOUT CONTRAST TECHNIQUE: Multidetector CT imaging of the abdomen was performed following the standard protocol without IV contrast. COMPARISON:  None. FINDINGS: Lower chest: There is bibasilar linear nodular thickening medially suggesting aspiration pneumonitis Hepatobiliary: No focal hepatic lesion on noncontrast exam. Normal gallbladder Pancreas: Pancreas is normal. No ductal dilatation. No pancreatic inflammation. Spleen: Normal spleen Adrenals/urinary tract: Adrenal glands and kidneys are normal. The ureters and bladder are normal. Stomach/Bowel: There is a feeding tube within the gastric body and fundus. The stomach is nondistended and in typical orientation. Limited view of the bowel is unremarkable. Vascular/Lymphatic: Abdominal or is normal caliber. Infrarenal IVC filter noted. Intimal calcification aorta. Other: No free fluid. Musculoskeletal: No aggressive osseous lesion. IMPRESSION: 1. Feeding tube extend into the gastric fundus. Stomach in typical orientation without obstruction. 2. Bibasilar linear nodular thickening in the medial lung bases is suggestive of aspiration pneumonitis. 3. Atherosclerotic calcification aorta. Electronically Signed   By: Suzy Bouchard M.D.   On: 10/27/2015 16:49   Dg Chest 2 View  10/26/2015  CLINICAL DATA:  Dysphagia.  Cough and congestion today. EXAM: CHEST  2 VIEW COMPARISON:  None. FINDINGS: Cardiomediastinal silhouette is normal. Mediastinal contours appear intact. There is no evidence of pneumothorax. There are low lung volumes. Linear peribronchovascular and patchy airspace consolidation is seen in the left lower lobe. More subtle  consolidation is noted in the right lower lobe. Osseous structures are without acute abnormality. Soft tissues are grossly normal. IMPRESSION: Low lung volumes. Bilateral lower lobes airspace consolidation, worse on the left, concerning for pneumonia. Given the distribution of airspace consolidation, aspiration pneumonia becomes a leading consideration. Electronically Signed   By: Fidela Salisbury M.D.   On: 10/26/2015 10:33   Dg Abd 1 View  11/01/2015  CLINICAL DATA:  PEG tube placement EXAM: ABDOMEN - 1 VIEW COMPARISON:  10/29/2015 FINDINGS: Scattered contrast throughout nondistended colon. Feeding tube projects over third portion of duodenum. Nonobstructive bowel gas pattern. IVC filter noted. Scattered atherosclerotic calcifications including aorta. Osseous demineralization with LEFT hip prosthesis. IMPRESSION: Scattered contrast throughout colon. Aortic atherosclerosis. Electronically Signed   By: Lavonia Dana M.D.   On: 11/01/2015 07:54   Ct Head Wo Contrast  10/23/2015  CLINICAL DATA:  Patient with sudden onset slurred speech and dizziness. Code stroke. EXAM: CT HEAD WITHOUT CONTRAST TECHNIQUE: Contiguous axial images were obtained from the base of the skull through the vertex without intravenous contrast. COMPARISON:  MRI brain 01/23/2015; CT brain 01/22/2015. FINDINGS: Ventricles and sulci are prominent for patient's age. Old left frontal lobe encephalomalacia. Periventricular and subcortical white matter hypodensity compatible with chronic microvascular ischemic changes. No evidence for large territory infarct, intracranial hemorrhage, mass lesion or mass-effect. Orbits are unremarkable. Ethmoid air cell mucosal thickening. Mastoid air cells unremarkable. Calvarium is intact. IMPRESSION: No acute intracranial process. Chronic microvascular ischemic changes and cortical atrophy. Critical Value/emergent results were called by telephone at the time of interpretation on 10/23/2015 at 3:03 pm to Dr.  Barbee Cough, who verbally acknowledged these results. Electronically Signed   By: Lovey Newcomer M.D.   On: 10/23/2015 15:12   Ct Angio Neck W Or Wo Contrast  10/24/2015  CLINICAL DATA:  Stroke.  Right MCA infarct. EXAM: CT ANGIOGRAPHY NECK TECHNIQUE: Multidetector CT imaging of the neck was performed using the standard protocol during bolus administration of intravenous contrast. Multiplanar CT image reconstructions and MIPs were obtained to evaluate the vascular anatomy. Carotid stenosis measurements (  when applicable) are obtained utilizing NASCET criteria, using the distal internal carotid diameter as the denominator. CONTRAST:  50 mL Isovue 370 IV COMPARISON:  MRI head 10/23/2015 FINDINGS: Aortic arch: Atherosclerotic calcification in the aortic arch. Negative for aneurysm or dissection. Atherosclerotic calcification the proximal great vessels which are widely patent without significant stenosis. Lung apices clear. Right carotid system: Right common carotid artery widely patent. Atherosclerotic calcification proximal right internal carotid artery narrowing the lumen by 50% diameter stenosis. Additional noncalcified stenosis 15 mm distal to the bifurcation also narrowing the lumen by 50% diameter stenosis. Right external carotid artery mild stenosis proximally. Left carotid system: Mild atherosclerotic disease in the left common carotid artery without significant stenosis. Mild atherosclerotic calcification proximal left internal carotid artery. 40% diameter stenosis proximal left internal carotid artery. Left external carotid artery widely patent. Vertebral arteries:Mild stenosis proximal right vertebral artery. Remainder of the right vertebral artery is patent to the basilar. Mild stenosis proximal left vertebral artery without additional stenosis. Left vertebral artery patent to the basilar. Skeleton: Disc degeneration and spondylosis. Facet degeneration. No fracture or acute skeletal abnormality. Other neck:  No adenopathy in the neck. IMPRESSION: 50% diameter stenosis at the origin of the right internal carotid artery. 15 mm distally there is another 50% diameter stenosis due to noncalcified atherosclerotic plaque. 40% diameter stenosis proximal left internal carotid artery Mild stenosis at the proximal vertebral artery bilaterally. Both vertebral arteries patent. Electronically Signed   By: Franchot Gallo M.D.   On: 10/24/2015 18:02   Mr Brain Wo Contrast  10/25/2015  CLINICAL DATA:  65 year old hypertensive diabetic male with hyperlipidemia presenting with worsening of left-sided deficits and slurred speech. Subsequent encounter. EXAM: MRI HEAD WITHOUT CONTRAST TECHNIQUE: Multiplanar, multiecho pulse sequences of the brain and surrounding structures were obtained without intravenous contrast. COMPARISON:  10/24/2015 CT angiogram head and neck. 10/23/2015 in 01/2016 2016 brain MR. FINDINGS: Interval enlargement of right posterior corona radiata/ posterior right lenticular nucleus nonhemorrhagic infarct. Minimal deposition lenticular nucleus bilaterally similar to the prior exam. Remote left frontal lobe infarct with encephalomalacia. Remote corona radiata and basal ganglia infarcts bilaterally. Moderate global atrophy without hydrocephalus. Major intracranial vascular structures are patent. No intracranial mass lesion noted on this unenhanced exam. Complex persistent opacification mid to posterior left ethmoid sinus air cells. Mild mucosal thickening remainder of ethmoid sinus air cells. Minimal to mild mucosal thickening frontal sinuses. Minimal maxillary sinus mucosal thickening. IMPRESSION: Interval enlargement of right posterior corona radiata/ posterior right lenticular nucleus nonhemorrhagic infarct. Remote left frontal lobe infarct with encephalomalacia. Remote corona radiata and basal ganglia infarcts bilaterally. Moderate global atrophy without hydrocephalus. Complex persistent opacification mid to  posterior left ethmoid sinus air cells. Mild mucosal thickening remainder of ethmoid sinus air cells. Minimal to mild mucosal thickening frontal sinuses. Minimal maxillary sinus mucosal thickening. Electronically Signed   By: Genia Del M.D.   On: 10/25/2015 12:18   Mr Brain Wo Contrast  10/23/2015  CLINICAL DATA:  Dysarthria which began earlier today. Associated lightheadedness and gait instability. EXAM: MRI HEAD WITHOUT CONTRAST MRA HEAD WITHOUT CONTRAST TECHNIQUE: Multiplanar, multiecho pulse sequences of the brain and surrounding structures were obtained without intravenous contrast. Angiographic images of the head were obtained using MRA technique without contrast. COMPARISON:  CT head earlier today. MRI head 01/23/2015. MRI dated 11/16/2011. FINDINGS: MRI HEAD FINDINGS There is acute, RIGHT MCA territory lenticulostriate infarct affecting the lateral lentiform nucleus, and periventricular white matter. See for instance image 33 series 3, and image 20 series 9. No associated  hemorrhage. No mass lesion, or extra-axial fluid. Global atrophy. Hydrocephalus ex vacuo. Extensive chronic microvascular ischemic change. Multiple hemorrhagic and nonhemorrhagic chronic lacunar infarcts, with scattered areas of susceptibility most notable in the RIGHT posterior lentiform nucleus, LEFT lateral lentiform nucleus, and BILATERAL subcortical white matter. Ovoid focus susceptibility LEFT frontal lobe superiorly, with encephalomalacia, can be seen as far back as 2013, potentially an area of old trauma. No midline abnormality. RIGHT cataract extraction appears uncomplicated. Possible mucocele of the LEFT ethmoid air cells, incompletely evaluated. No middle ear or mastoid fluid. MRA HEAD FINDINGS Dolichoectatic but widely patent LEFT internal carotid artery. Mild non stenotic atheromatous change inferior cavernous segment RIGHT internal carotid artery otherwise widely patent vessel. Basilar artery minimally irregular in its  proximal segment, with both vertebrals contributing, RIGHT dominant. Mild non stenotic atheromatous change above stools. Dolichoectatic, slightly irregular, but non stenotic M1 MCA segments. BILATERAL M2 and M3 vessel irregularity, consistent with intracranial atherosclerotic disease. 75% stenosis A1 ACA on the LEFT. Mild non stenotic irregularity of the RIGHT A1 ACA both distal anterior cerebral arteries are patent. No proximal posterior cerebral artery flow limiting stenosis. 75% stenosis of the proximal P2 segment on the LEFT. Mild irregularity of the LEFT greater than RIGHT superior cerebellar arteries. Anterior inferior cerebellar arteries poorly visualized. RIGHT posterior inferior cerebellar artery robust ; LEFT is moderately diseased. IMPRESSION: Acute RIGHT MCA territory lenticulostriate infarct, nonhemorrhagic. Atrophy and small vessel disease. Scattered foci of chronic hemorrhagic and nonhemorrhagic lacunar infarcts. Incompletely evaluated LEFT ethmoid air cell opacity, possible developing mucocele. No proximal large vessel occlusion or flow-limiting stenosis. Intracranial atherosclerotic change most notable in the LEFT A1 ACA and LEFT P2 PCA, as well as BILATERAL MCA M2 and M3 vessels. Electronically Signed   By: Staci Righter M.D.   On: 10/23/2015 19:24   Ir Gastrostomy Tube Mod Sed  11/02/2015  INDICATION: History of stroke, now with dysphagia. Request made for percutaneous gastrostomy tube placement for enteric nutrition supplementation. EXAM: PULL TROUGH GASTROSTOMY TUBE PLACEMENT COMPARISON:  Abdominal radiograph - 11/01/2015; CT abdomen pelvis - 10/27/2015 MEDICATIONS: Ancef 2 gm IV; Antibiotics were administered within 1 hour of the procedure. Glucagon 1 mg IV CONTRAST:  20 mL of Isovue 300 administered into the gastric lumen. ANESTHESIA/SEDATION: Moderate (conscious) sedation was employed during this procedure. A total of Fentanyl 100 mcg was administered intravenously. Moderate Sedation Time:  8 minutes. The patient's level of consciousness and vital signs were monitored continuously by radiology nursing throughout the procedure under my direct supervision. FLUOROSCOPY TIME:  2 minutes 48 seconds (15 mGy) COMPLICATIONS: None immediate. PROCEDURE: Informed written consent was obtained from the patient's family following explanation of the procedure, risks, benefits and alternatives. A time out was performed prior to the initiation of the procedure. Ultrasound scanning was performed to demarcate the edge of the left lobe of the liver. Maximal barrier sterile technique utilized including caps, mask, sterile gowns, sterile gloves, large sterile drape, hand hygiene and Betadine prep. The left upper quadrant was sterilely prepped and draped. An oral gastric catheter was inserted into the stomach under fluoroscopy. The existing nasogastric feeding tube was removed. The left costal margin and air / barium opacified transverse colon were identified and avoided. Air was injected into the stomach for insufflation and visualization under fluoroscopy. Under sterile conditions a 17 gauge trocar needle was utilized to access the stomach percutaneously beneath the left subcostal margin after the overlying soft tissues were anesthetized with 1% Lidocaine with epinephrine. Needle position was confirmed within the stomach with  aspiration of air and injection of small amount of contrast. A single T tack was deployed for gastropexy. Over an Amplatz guide wire, a 9-French sheath was inserted into the stomach. A snare device was utilized to capture the oral gastric catheter. The snare device was pulled retrograde from the stomach up the esophagus and out the oropharynx. The 20-French pull-through gastrostomy was connected to the snare device and pulled antegrade through the oropharynx down the esophagus into the stomach and then through the percutaneous tract external to the patient. The gastrostomy was assembled externally.  Contrast injection confirms position in the stomach. Several spot radiographic images were obtained in various obliquities for documentation. The patient tolerated procedure well without immediate post procedural complication. FINDINGS: After successful fluoroscopic guided placement, the gastrostomy tube is appropriately positioned with internal disc against the ventral aspect of the gastric lumen. IMPRESSION: Successful fluoroscopic insertion of a 20-French pull-through gastrostomy tube. The gastrostomy may be used immediately for medication administration and in 24 hrs for the initiation of feeds. Electronically Signed   By: Sandi Mariscal M.D.   On: 11/02/2015 10:27   Dg Chest Port 1 View  10/27/2015  CLINICAL DATA:  Cough. EXAM: PORTABLE CHEST 1 VIEW COMPARISON:  Radiograph of October 26, 2015. FINDINGS: The heart size and mediastinal contours are within normal limits. No pneumothorax is noted. Mild bibasilar subsegmental atelectasis is noted. No significant pleural effusion is noted. Distal tip of feeding tube is seen in proximal stomach. Atherosclerosis of thoracic aorta is noted. The visualized skeletal structures are unremarkable. IMPRESSION: Mild bibasilar subsegmental atelectasis.  Aortic atherosclerosis. Electronically Signed   By: Marijo Conception, M.D.   On: 10/27/2015 12:23   Dg Abd Portable 1v  11/02/2015  CLINICAL DATA:  Abdominal pain. EXAM: PORTABLE ABDOMEN - 1 VIEW COMPARISON:  None. FINDINGS: A percutaneous enteric tube projects over the left upper quadrant, likely entering the stomach. An IVC filter is identified. Contrast is seen in the colon, suggesting diverticulosis. No other acute abnormalities. IMPRESSION: No acute abnormalities. Electronically Signed   By: Dorise Bullion III M.D   On: 11/02/2015 23:52   Dg Abd Portable 1v  10/29/2015  CLINICAL DATA:  Enteric tube placement EXAM: PORTABLE ABDOMEN - 1 VIEW COMPARISON:  10/28/2015 abdominal radiograph FINDINGS: Weighted enteric tube tip  is at the junction of the second and third portions of the duodenum. IVC filter is in place in the medial right abdomen. Partially visualized left hip hemiarthroplasty. No dilated small bowel loops. Mild colorectal stool volume. No evidence of pneumatosis or pneumoperitoneum. IMPRESSION: Weighted enteric tube tip is at the junction of the second and third portions of the duodenum. Nonobstructive bowel gas pattern. Electronically Signed   By: Ilona Sorrel M.D.   On: 10/29/2015 18:47   Dg Abd Portable 1v  10/28/2015  CLINICAL DATA:  Patient status post feeding tube placement. EXAM: PORTABLE ABDOMEN - 1 VIEW COMPARISON:  CT abdomen pelvis 10/27/2015. FINDINGS: Weighted feeding tube tip projects over the fourth portion of the duodenum, within the left upper quadrant. IVC filter. Nonobstructed bowel gas pattern. Left hip arthroplasty. Lumbar spine degenerative changes. IMPRESSION: Weighted feeding tube tip projects over the left upper quadrant, likely within the fourth portion of the duodenum. Electronically Signed   By: Lovey Newcomer M.D.   On: 10/28/2015 14:19   Dg Abd Portable 1v  10/26/2015  CLINICAL DATA:  Encounter for feeding tube placement; EXAM: PORTABLE ABDOMEN - 1 VIEW COMPARISON:  10/26/2015 FINDINGS: Feeding tube extends into the first portion  duodenum/gastric antrum. Bibasilar lung opacities. IMPRESSION: Feeding tube in gastric antrum/ first portion duodenum. Electronically Signed   By: Suzy Bouchard M.D.   On: 10/26/2015 16:45   Mr Jodene Nam Head/brain Wo Cm  10/23/2015  CLINICAL DATA:  Dysarthria which began earlier today. Associated lightheadedness and gait instability. EXAM: MRI HEAD WITHOUT CONTRAST MRA HEAD WITHOUT CONTRAST TECHNIQUE: Multiplanar, multiecho pulse sequences of the brain and surrounding structures were obtained without intravenous contrast. Angiographic images of the head were obtained using MRA technique without contrast. COMPARISON:  CT head earlier today. MRI head 01/23/2015. MRI  dated 11/16/2011. FINDINGS: MRI HEAD FINDINGS There is acute, RIGHT MCA territory lenticulostriate infarct affecting the lateral lentiform nucleus, and periventricular white matter. See for instance image 33 series 3, and image 20 series 9. No associated hemorrhage. No mass lesion, or extra-axial fluid. Global atrophy. Hydrocephalus ex vacuo. Extensive chronic microvascular ischemic change. Multiple hemorrhagic and nonhemorrhagic chronic lacunar infarcts, with scattered areas of susceptibility most notable in the RIGHT posterior lentiform nucleus, LEFT lateral lentiform nucleus, and BILATERAL subcortical white matter. Ovoid focus susceptibility LEFT frontal lobe superiorly, with encephalomalacia, can be seen as far back as 2013, potentially an area of old trauma. No midline abnormality. RIGHT cataract extraction appears uncomplicated. Possible mucocele of the LEFT ethmoid air cells, incompletely evaluated. No middle ear or mastoid fluid. MRA HEAD FINDINGS Dolichoectatic but widely patent LEFT internal carotid artery. Mild non stenotic atheromatous change inferior cavernous segment RIGHT internal carotid artery otherwise widely patent vessel. Basilar artery minimally irregular in its proximal segment, with both vertebrals contributing, RIGHT dominant. Mild non stenotic atheromatous change above stools. Dolichoectatic, slightly irregular, but non stenotic M1 MCA segments. BILATERAL M2 and M3 vessel irregularity, consistent with intracranial atherosclerotic disease. 75% stenosis A1 ACA on the LEFT. Mild non stenotic irregularity of the RIGHT A1 ACA both distal anterior cerebral arteries are patent. No proximal posterior cerebral artery flow limiting stenosis. 75% stenosis of the proximal P2 segment on the LEFT. Mild irregularity of the LEFT greater than RIGHT superior cerebellar arteries. Anterior inferior cerebellar arteries poorly visualized. RIGHT posterior inferior cerebellar artery robust ; LEFT is moderately  diseased. IMPRESSION: Acute RIGHT MCA territory lenticulostriate infarct, nonhemorrhagic. Atrophy and small vessel disease. Scattered foci of chronic hemorrhagic and nonhemorrhagic lacunar infarcts. Incompletely evaluated LEFT ethmoid air cell opacity, possible developing mucocele. No proximal large vessel occlusion or flow-limiting stenosis. Intracranial atherosclerotic change most notable in the LEFT A1 ACA and LEFT P2 PCA, as well as BILATERAL MCA M2 and M3 vessels. Electronically Signed   By: Staci Righter M.D.   On: 10/23/2015 19:24     Assessment/Plan   ICD-9-CM ICD-10-CM   1. Physical deconditioning 799.3 R53.81   2. History of recent stroke V12.54 Z86.73    right MCA lenticulostriate infarct  3. Left hemiparesis ([place]) 342.90 G81.94    due to #2  4. Anemia due to chronic blood loss 280.0 D50.0   5. Essential hypertension 401.9 I10   6. Dysphagia due to recent stroke 438.82 I69.921    s/p Peg  7. PEG (percutaneous endoscopic gastrostomy) status ([place]) V44.1 Z93.1   8. HLD (hyperlipidemia) 272.4 E78.5   9. Type 2 diabetes mellitus with diabetic nephropathy, without long-term current use of insulin (HCC) 250.40 E11.21    583.81    10. Tobacco abuse 305.1 Z72.0   11. H/O: upper GI bleed V12.79 Z87.19   12. Severe protein-calorie malnutrition (HCC) 262 E43     Check CBC to follow Hgb  F/u with neurology,  VA and cardio as scheduled  Cont current meds as ordered  Peg tube care as indicated  TF as ordered  PT/OT/ST as ordered  GOAL: short term rehab and d/c home when medically appropriate. Communicated with pt and nursing.  Will follow  Con Arganbright S. Perlie Gold  [facility] [address] [place], [place] [address] [phone] Cell (Monday-Friday 8 AM - 5 PM) [phone] After 5 PM and follow prompts

## 2015-11-12 LAB — CBC AND DIFFERENTIAL
HCT: 31 % — AB (ref 41–53)
Hemoglobin: 9.9 g/dL — AB (ref 13.5–17.5)
PLATELETS: 544 10*3/uL — AB (ref 150–399)
WBC: 13.3 10*3/mL

## 2015-11-12 LAB — BASIC METABOLIC PANEL
BUN: 29 mg/dL — AB (ref 4–21)
CREATININE: 1.3 mg/dL (ref 0.6–1.3)
GLUCOSE: 112 mg/dL
POTASSIUM: 4.2 mmol/L (ref 3.4–5.3)
Sodium: 139 mmol/L (ref 137–147)

## 2015-11-25 ENCOUNTER — Encounter: Payer: Self-pay | Admitting: Vascular Surgery

## 2015-11-29 ENCOUNTER — Encounter (HOSPITAL_COMMUNITY): Payer: Medicare Other

## 2015-12-02 ENCOUNTER — Ambulatory Visit (INDEPENDENT_AMBULATORY_CARE_PROVIDER_SITE_OTHER): Payer: Medicare Other | Admitting: Vascular Surgery

## 2015-12-02 ENCOUNTER — Encounter: Payer: Self-pay | Admitting: Vascular Surgery

## 2015-12-02 VITALS — BP 99/52 | HR 54 | Temp 96.9°F | Resp 20 | Ht 69.0 in | Wt 149.0 lb

## 2015-12-02 DIAGNOSIS — I639 Cerebral infarction, unspecified: Secondary | ICD-10-CM | POA: Diagnosis not present

## 2015-12-02 DIAGNOSIS — I6521 Occlusion and stenosis of right carotid artery: Secondary | ICD-10-CM | POA: Diagnosis not present

## 2015-12-02 NOTE — Progress Notes (Signed)
Referring Physician: No ref. provider found  Patient name: Sean Collins MRN: [id number] DOB: 11/27/1950 Sex: male    HPI: Alyster Bingen Maslow is a 65 y.o. male,  He returns for a follow up visit s/p CVA with mod carotid stenosis.  He is currently in an out patient SNF for stroke rehab.  He is starting PO in take with speech therapy, but still maintains a feeding tube and has a flaccid left UE with minimal motion on the left LE.  His speech is garbled.     The patient presented to [facility] ED on 10/23/15 after he noticed he was unable to speak and had slurred speech. His history is difficult to obtain given current dysarthria. The patient denies any amaurosis fugax or hemiparesis. The patient states that he has had a stroke in the past but was unable to describe his symptoms. Per chart review, the patient experienced similar symptoms during his previous stroke.    The patient has a past medical history of hypertension managed on multiple antihypertensives. His hyperlipidemia is managed on a statin. He is a current smoker. His PTA meds included aspirin and Plavix but the patient is not able to say whether he took both. The patient works as a Presenter, broadcasting.    Past Medical History:  Diagnosis Date  . Diabetes mellitus without complication ([place])   . Dysarthria   . Dysphagia   . GI bleed   . Hyperlipidemia   . Hypertension   . Stroke [facility])    Past Surgical History:  Procedure Laterality Date  . ESOPHAGOGASTRODUODENOSCOPY (EGD) WITH PROPOFOL N/A 11/03/2015   Procedure: ESOPHAGOGASTRODUODENOSCOPY (EGD) WITH PROPOFOL;  Surgeon: Manus Gunning, MD;  Location: [place] [facility];  Service: Gastroenterology;  Laterality: N/A;    Family History  Problem Relation Age of Onset  . Diabetes Brother     SOCIAL HISTORY: Social History   Social History  . Marital status: Legally Separated    Spouse name: N/A  . Number of children: N/A  . Years of education: N/A    Occupational History  . Not on file.   Social History Main Topics  . Smoking status: Current Every Day Smoker    Packs/day: 0.50    Years: 32.00    Types: Cigarettes  . Smokeless tobacco: Not on file     Comment: Started at age 42 though quit for 10 years at one point  . Alcohol use No  . Drug use: No  . Sexual activity: Not on file   Other Topics Concern  . 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 acetaminophen (TYLENOL) 650 MG CR tablet Take 650 mg by mouth every 4 (four) hours as needed for pain or fever.    Marland Kitchen allopurinol (ZYLOPRIM) 300 MG tablet Take 300 mg by mouth daily.    Marland Kitchen amLODipine (NORVASC) 10 MG tablet Take 1 tablet (10 mg total) by mouth daily. 30 tablet 2  . aspirin EC 325 MG EC tablet Take 1 tablet (325 mg total) by mouth daily. 30 tablet 0  . atenolol (TENORMIN) 50 MG tablet Take 1 tablet (50 mg total) by mouth 2 (two) times daily. 60 tablet 2  . atorvastatin (LIPITOR) 40  MG tablet Take 1 tablet (40 mg total) by mouth daily at 6 PM. 30 tablet 0  . carboxymethylcellulose (REFRESH PLUS) 0.5 % SOLN Place 1 drop into both eyes 3 (three) times daily as needed (dry eyes).    . cholecalciferol (VITAMIN D) 1000 UNITS tablet Take 1,000 Units by mouth daily.    . enalapril (VASOTEC) 20 MG tablet Take 1 tablet (20 mg total) by mouth daily. 30 tablet 2  . folic acid (FOLVITE) 1 MG tablet Take 1 mg by mouth daily.    . Nutritional Supplements (FIBERSOURCE) LIQD Give 65 ml/hour continuously via pump via PEG to provide 1812 calories per day.    . pantoprazole sodium (PROTONIX) 40 mg/20 mL PACK Place 20 mLs (40 mg total) into feeding tube 2 (two) times daily. 30 each 3   No current facility-administered medications for this visit.     ROS:    General:  No weight loss, Fever, chills  HEENT: No recent headaches, no nasal bleeding, no visual changes, no sore throat  Neurologic: No dizziness, blackouts, seizures. positive  symptoms of stroke. No recent episodes of slurred speech, or temporary blindness.  Cardiac: No recent episodes of chest pain/pressure, no shortness of breath at rest.  No shortness of breath with exertion.  Denies history of atrial fibrillation or irregular heartbeat  Vascular: No history of rest pain in feet.  No history of claudication.  No history of non-healing ulcer, No history of DVT   Pulmonary: No home oxygen, no productive cough, no hemoptysis,  No asthma or wheezing  Musculoskeletal:  [ ]  Arthritis, [ ]  Low back pain,  [ ]  Joint pain  Hematologic:No history of hypercoagulable state.  No history of easy bleeding.  No history of anemia  Gastrointestinal: No hematochezia or melena,  No gastroesophageal reflux, no trouble swallowing  Urinary: [ ]  chronic Kidney disease, [ ]  on HD - [ ]  MWF or [ ]  TTHS, [ ]  Burning with urination, [ ]  Frequent urination, [ ]  Difficulty urinating;   Skin: No rashes  Psychological: No history of anxiety,  No history of depression   Physical Examination  Vitals:   12/02/15 1511  BP: (!) 99/52  Pulse: (!) 54  Resp: 20  Temp: (!) 96.9 F (36.1 C)  TempSrc: Oral  SpO2: 100%  Weight: 149 lb (67.6 kg)  Height: 5\' 9"  (1.753 m)    Body mass index is 22 kg/m.  General:  Alert and oriented, no acute distress HEENT: Normal Neck: No bruit or JVD Pulmonary: Clear to auscultation bilaterally Cardiac: Regular Rate and Rhythm without murmur Abdomen: Soft, present gastric tube Skin: No rash Extremity Pulses:  2+ radial,dorsalis pedis pulses bilaterally Musculoskeletal: Flaccid left UE and minimal left LE motion    DATA:  Previous data from 10/25/2015 Carotid duplex 10/24/15 Preliminary report: Right: 40-59% ICA stenosis (mid to distal),highest end of scale,  however, higher velocities may be obscured as patient has high bifurcation and significant plaquing. Left: 1-39% ICA stenosis. Unable to adequately visualize the distal ICA secondary to high bifurcation. Bilateral vertebral artery flow is antegrade.   Mr Brain Wo Contrast 10/25/2015 IMPRESSION: Interval enlargement of right posterior corona radiata/ posterior right lenticular nucleus nonhemorrhagic infarct. Remote left frontal lobe infarct with encephalomalacia. Remote corona radiata and basal ganglia infarcts bilaterally. Moderate global atrophy without hydrocephalus. Complex persistent opacification mid to posterior left ethmoid sinus air cells. Mild mucosal thickening remainder of ethmoid sinus air cells. Minimal to mild mucosal thickening frontal sinuses. Minimal maxillary sinus  mucosal thickening.   Ct Angio Neck W Or Wo Contrast 10/24/2015 IMPRESSION: 50% diameter stenosis at the origin of the right internal carotid artery. 15 mm distally there is another 50% diameter stenosis due to noncalcified atherosclerotic plaque. 40% diameter stenosis proximal left internal carotid artery. Mild stenosis at the proximal vertebral artery bilaterally. Both vertebral arteries patent.   TTE  - Left ventricle: The cavity size was normal. Wall thickness was normal. Systolic function was normal. The estimated ejection fraction was in the range of 55% to 60%. Wall motion was normal; there were no regional wall motion abnormalities. Left ventricular diastolic function parameters were normal. - Mitral valve: Calcified annulus. Mildly thickened leaflets . - Left atrium: The atrium was mildly dilated. - Right atrium: The atrium was mildly dilated. - Atrial septum: No defect or patent foramen ovale was identified. - Tricuspid valve: There was moderate regurgitation. - Pulmonary arteries: PA peak pressure: 55 mm Hg (S). - Pericardium, extracardiac: A trivial pericardial effusion was  identified.   ASSESSMENT:   Right: 40-59% ICA stenosis (mid to distal),  Interval enlargement of right posterior corona radiata/ posterior right lenticular nucleus nonhemorrhagic infarct.  PLAN:   We will have him follow up in 6 weeks for a repeat carotid duplex and exam.  We feel he needs more recovery time from his stroke prior to surgery.  He will continue statin, Asprin and plavix daily with rehab for improvement in his stroke recovery.     Theda Sers, EMMA MAUREEN PA-C [facility]  The patient was seen in conjunction with Dr. Oneida Alar  Patient with approximately 50% stenosis right carotid. He is still severely debilitated from his recent stroke. He has a full left hemiplegia as well as requiring a feeding tube for swallowing. I am somewhat reluctant to proceed with carotid artery surgery for fairly limited indications of a 50% stenosis in spite of the fact that he has had a stroke. If he makes more significant recovery we could consider a carotid intervention. I will see him again in 6 weeks' time. We will repeat his duplex scan at that time.  Ruta Hinds, MD [facility] Office: [phone] Pager: [phone]

## 2015-12-07 ENCOUNTER — Other Ambulatory Visit (HOSPITAL_COMMUNITY): Payer: Self-pay | Admitting: Internal Medicine

## 2015-12-07 ENCOUNTER — Other Ambulatory Visit: Payer: Self-pay | Admitting: *Deleted

## 2015-12-07 DIAGNOSIS — I6523 Occlusion and stenosis of bilateral carotid arteries: Secondary | ICD-10-CM

## 2015-12-07 DIAGNOSIS — R1310 Dysphagia, unspecified: Secondary | ICD-10-CM

## 2015-12-08 ENCOUNTER — Non-Acute Institutional Stay (SKILLED_NURSING_FACILITY): Payer: Medicare Other | Admitting: Nurse Practitioner

## 2015-12-08 ENCOUNTER — Encounter: Payer: Self-pay | Admitting: Nurse Practitioner

## 2015-12-08 DIAGNOSIS — G8194 Hemiplegia, unspecified affecting left nondominant side: Secondary | ICD-10-CM | POA: Diagnosis not present

## 2015-12-08 DIAGNOSIS — E1159 Type 2 diabetes mellitus with other circulatory complications: Secondary | ICD-10-CM | POA: Diagnosis not present

## 2015-12-08 DIAGNOSIS — E785 Hyperlipidemia, unspecified: Secondary | ICD-10-CM | POA: Diagnosis not present

## 2015-12-08 DIAGNOSIS — I6990 Unspecified sequelae of unspecified cerebrovascular disease: Secondary | ICD-10-CM | POA: Diagnosis not present

## 2015-12-08 DIAGNOSIS — E440 Moderate protein-calorie malnutrition: Secondary | ICD-10-CM

## 2015-12-08 DIAGNOSIS — I10 Essential (primary) hypertension: Secondary | ICD-10-CM

## 2015-12-08 NOTE — Progress Notes (Signed)
Nursing Home Location:  [facility]  Place of Service: SNF (31)  PCP: Default, Provider, MD  No Known Allergies  Chief Complaint  Patient presents with  . Medical Management of Chronic Issues    Routine Visit    HPI:  Patient is a 65 y.o. male seen today at [facility] to follow up on chronic conditions. Pt with hx of CVA, arthrosclerosis, CAD, htn, DM, hyperlipidemia. Pt with multiple stroke history due to atherosclerosis. Had recent right MCA infarct and now with dysarthria and dysphagia and left sided weakness. Has been working with therapy and doing well. Self propels down hallways. Working with [facility] and has swallow studied scheduled.  Following with vascular due to Carotid stenosis, has follow up in 6 weeks for repeat carotid duplex and exam.  HPI and ROS limited due to aphagia.  Denies pain or depression at this time Pt was treated with avelox for pneumonia in the last month and doing well.  No reports of  cough, congestion or chest pain.  Review of Systems:  Review of Systems  Unable to perform ROS: Other    Past Medical History:  Diagnosis Date  . Diabetes mellitus without complication ([place])   . Dysarthria   . Dysphagia   . GI bleed   . Hyperlipidemia   . Hypertension   . Stroke [facility])    Past Surgical History:  Procedure Laterality Date  . ESOPHAGOGASTRODUODENOSCOPY (EGD) WITH PROPOFOL N/A 11/03/2015   Procedure: ESOPHAGOGASTRODUODENOSCOPY (EGD) WITH PROPOFOL;  Surgeon: Manus Gunning, MD;  Location: [place];  Service: Gastroenterology;  Laterality: N/A;   Social History:   reports that he has been smoking Cigarettes.  He has a 16.00 pack-year smoking history. He has never used smokeless tobacco. He reports that he does not drink alcohol or use drugs.  Family History  Problem Relation Age of Onset  . Diabetes Brother     Medications: Patient's Medications  New Prescriptions   No medications on file  Previous Medications   ACETAMINOPHEN (TYLENOL) 650 MG CR TABLET    Take 650 mg by mouth every 4 (four) hours as needed for pain or fever.   ALLOPURINOL (ZYLOPRIM) 300 MG TABLET    Take 300 mg by mouth daily.   AMLODIPINE (NORVASC) 10 MG TABLET    Take 1 tablet (10 mg total) by mouth daily.   ASPIRIN EC 325 MG EC TABLET    Take 1 tablet (325 mg total) by mouth daily.   ATENOLOL (TENORMIN) 50 MG TABLET    Take 1 tablet (50 mg total) by mouth 2 (two) times daily.   ATORVASTATIN (LIPITOR) 40 MG TABLET    Take 1 tablet (40 mg total) by mouth daily at 6 PM.   CARBOXYMETHYLCELLULOSE (REFRESH PLUS) 0.5 % SOLN    Place 1 drop into both eyes 3 (three) times daily as needed (dry eyes).   CHOLECALCIFEROL (VITAMIN D) 1000 UNITS TABLET    Take 1,000 Units by mouth daily.   ENALAPRIL (VASOTEC) 20 MG TABLET    Take 1 tablet (20 mg total) by mouth daily.   FOLIC ACID (FOLVITE) 1 MG TABLET    Take 1 mg by mouth daily.   NUTRITIONAL SUPPLEMENTS (ISOSOURCE 1.5 CAL) LIQD    Give 2 cans three times daily bolus via PEG tub with 30 ml flushes before and after feeding and medications. This will provide 2250 Kcal per day.   PANTOPRAZOLE SODIUM (PROTONIX) 40 MG/20 ML PACK    Place 20  mLs (40 mg total) into feeding tube 2 (two) times daily.  Modified Medications   No medications on file  Discontinued Medications   NUTRITIONAL SUPPLEMENTS (FIBERSOURCE) LIQD    Give 65 ml/hour continuously via pump via PEG to provide 1812 calories per day.     Physical Exam: Vitals:   12/08/15 1037  BP: (!) 137/58  Pulse: 64  Resp: 18  Temp: 97.8 F (36.6 C)  TempSrc: Oral  Weight: 148 lb (67.1 kg)  Height: 5\' 9"  (1.753 m)    Physical Exam  Constitutional: He appears well-developed and well-nourished. No distress.  HENT:  Head: Normocephalic and atraumatic.  Mouth/Throat: Oropharynx is clear and moist. No oropharyngeal exudate.  Left sided facial droop  Eyes: Conjunctivae and EOM are normal. Pupils are equal, round, and reactive to light.  Neck:  Normal range of motion. Neck supple.  Cardiovascular: Normal rate, regular rhythm and normal heart sounds.   Pulmonary/Chest: Effort normal and breath sounds normal.  Abdominal: Soft. Bowel sounds are normal.  PEG tube intact   Musculoskeletal: He exhibits no edema or tenderness.  Neurological: He is alert.  Left sided hemiplegia    Skin: Skin is warm and dry. He is not diaphoretic.  Psychiatric: He has a normal mood and affect.    Labs reviewed: Basic Metabolic Panel:  Recent Labs  11/04/15 0416 11/05/15 0614 11/06/15 0515 11/07/15 0520  NA 144 144 145 144  K 4.0 3.7 3.7 4.3  CL 115* 112* 113* 112*  CO2 22 24 24 27   GLUCOSE 99 101* 128* 143*  BUN 42* 28* 26* 24*  CREATININE 1.45* 1.26* 1.35* 1.16  CALCIUM 8.6* 9.0 9.0 7.4*  MG 2.0 2.0 2.2  --   PHOS 3.8 3.7 3.8  --    Liver Function Tests:  Recent Labs  01/22/15 1757 10/23/15 1442 11/04/15 0416 11/05/15 0614 11/06/15 0515  AST 24 23  --   --   --   ALT 16* 17  --   --   --   ALKPHOS 84 71  --   --   --   BILITOT 0.4 0.4  --   --   --   PROT 7.4 7.1  --   --   --   ALBUMIN 3.3* 3.4* 2.2* 2.5* 2.4*   No results for input(s): LIPASE, AMYLASE in the last 8760 hours. No results for input(s): AMMONIA in the last 8760 hours. CBC:  Recent Labs  11/04/15 0416  11/05/15 0614  11/06/15 0515 11/07/15 0520 11/08/15 0526  WBC 12.5*  < > 15.8*  --  15.7* 12.5* 12.9*  NEUTROABS 8.7*  --  12.1*  --  12.4*  --   --   HGB 6.5*  < > 9.4*  < > 9.3* 8.7* 9.0*  HCT 20.3*  < > 28.6*  < > 28.4* 26.5* 28.5*  MCV 83.5  < > 82.7  --  85.3 84.7 85.1  PLT 267  < > 303  --  322 372 424*  < > = values in this interval not displayed. TSH: No results for input(s): TSH in the last 8760 hours. A1C: Lab Results  Component Value Date   HGBA1C 6.1 (H) 10/23/2015   Lipid Panel:  Recent Labs  01/23/15 0932 10/24/15 0502  CHOL 233* 138  HDL 41 43  LDLCALC 180* 83  TRIG 62 59  CHOLHDL 5.7 3.2    Radiological Exams: Ct  Head Wo Contrast  10/23/2015  CLINICAL DATA:  Patient with sudden  onset slurred speech and dizziness. Code stroke. EXAM: CT HEAD WITHOUT CONTRAST TECHNIQUE: Contiguous axial images were obtained from the base of the skull through the vertex without intravenous contrast. COMPARISON:  MRI brain 01/23/2015; CT brain 01/22/2015. FINDINGS: Ventricles and sulci are prominent for patient's age. Old left frontal lobe encephalomalacia. Periventricular and subcortical white matter hypodensity compatible with chronic microvascular ischemic changes. No evidence for large territory infarct, intracranial hemorrhage, mass lesion or mass-effect. Orbits are unremarkable. Ethmoid air cell mucosal thickening. Mastoid air cells unremarkable. Calvarium is intact. IMPRESSION: No acute intracranial process. Chronic microvascular ischemic changes and cortical atrophy. Critical Value/emergent results were called by telephone at the time of interpretation on 10/23/2015 at 3:03 pm to Dr. Barbee Cough, who verbally acknowledged these results. Electronically Signed   By: Lovey Newcomer M.D.   On: 10/23/2015 15:12   Ct Angio Neck W Or Wo Contrast  10/24/2015  CLINICAL DATA:  Stroke.  Right MCA infarct. EXAM: CT ANGIOGRAPHY NECK TECHNIQUE: Multidetector CT imaging of the neck was performed using the standard protocol during bolus administration of intravenous contrast. Multiplanar CT image reconstructions and MIPs were obtained to evaluate the vascular anatomy. Carotid stenosis measurements (when applicable) are obtained utilizing NASCET criteria, using the distal internal carotid diameter as the denominator. CONTRAST:  50 mL Isovue 370 IV COMPARISON:  MRI head 10/23/2015 FINDINGS: Aortic arch: Atherosclerotic calcification in the aortic arch. Negative for aneurysm or dissection. Atherosclerotic calcification the proximal great vessels which are widely patent without significant stenosis. Lung apices clear. Right carotid system: Right common  carotid artery widely patent. Atherosclerotic calcification proximal right internal carotid artery narrowing the lumen by 50% diameter stenosis. Additional noncalcified stenosis 15 mm distal to the bifurcation also narrowing the lumen by 50% diameter stenosis. Right external carotid artery mild stenosis proximally. Left carotid system: Mild atherosclerotic disease in the left common carotid artery without significant stenosis. Mild atherosclerotic calcification proximal left internal carotid artery. 40% diameter stenosis proximal left internal carotid artery. Left external carotid artery widely patent. Vertebral arteries:Mild stenosis proximal right vertebral artery. Remainder of the right vertebral artery is patent to the basilar. Mild stenosis proximal left vertebral artery without additional stenosis. Left vertebral artery patent to the basilar. Skeleton: Disc degeneration and spondylosis. Facet degeneration. No fracture or acute skeletal abnormality. Other neck: No adenopathy in the neck. IMPRESSION: 50% diameter stenosis at the origin of the right internal carotid artery. 15 mm distally there is another 50% diameter stenosis due to noncalcified atherosclerotic plaque. 40% diameter stenosis proximal left internal carotid artery Mild stenosis at the proximal vertebral artery bilaterally. Both vertebral arteries patent. Electronically Signed   By: Franchot Gallo M.D.   On: 10/24/2015 18:02   Mr Brain Wo Contrast  10/23/2015  CLINICAL DATA:  Dysarthria which began earlier today. Associated lightheadedness and gait instability. EXAM: MRI HEAD WITHOUT CONTRAST MRA HEAD WITHOUT CONTRAST TECHNIQUE: Multiplanar, multiecho pulse sequences of the brain and surrounding structures were obtained without intravenous contrast. Angiographic images of the head were obtained using MRA technique without contrast. COMPARISON:  CT head earlier today. MRI head 01/23/2015. MRI dated 11/16/2011. FINDINGS: MRI HEAD FINDINGS There is  acute, RIGHT MCA territory lenticulostriate infarct affecting the lateral lentiform nucleus, and periventricular white matter. See for instance image 33 series 3, and image 20 series 9. No associated hemorrhage. No mass lesion, or extra-axial fluid. Global atrophy. Hydrocephalus ex vacuo. Extensive chronic microvascular ischemic change. Multiple hemorrhagic and nonhemorrhagic chronic lacunar infarcts, with scattered areas of susceptibility most notable in the RIGHT  posterior lentiform nucleus, LEFT lateral lentiform nucleus, and BILATERAL subcortical white matter. Ovoid focus susceptibility LEFT frontal lobe superiorly, with encephalomalacia, can be seen as far back as 2013, potentially an area of old trauma. No midline abnormality. RIGHT cataract extraction appears uncomplicated. Possible mucocele of the LEFT ethmoid air cells, incompletely evaluated. No middle ear or mastoid fluid. MRA HEAD FINDINGS Dolichoectatic but widely patent LEFT internal carotid artery. Mild non stenotic atheromatous change inferior cavernous segment RIGHT internal carotid artery otherwise widely patent vessel. Basilar artery minimally irregular in its proximal segment, with both vertebrals contributing, RIGHT dominant. Mild non stenotic atheromatous change above stools. Dolichoectatic, slightly irregular, but non stenotic M1 MCA segments. BILATERAL M2 and M3 vessel irregularity, consistent with intracranial atherosclerotic disease. 75% stenosis A1 ACA on the LEFT. Mild non stenotic irregularity of the RIGHT A1 ACA both distal anterior cerebral arteries are patent. No proximal posterior cerebral artery flow limiting stenosis. 75% stenosis of the proximal P2 segment on the LEFT. Mild irregularity of the LEFT greater than RIGHT superior cerebellar arteries. Anterior inferior cerebellar arteries poorly visualized. RIGHT posterior inferior cerebellar artery robust ; LEFT is moderately diseased. IMPRESSION: Acute RIGHT MCA territory  lenticulostriate infarct, nonhemorrhagic. Atrophy and small vessel disease. Scattered foci of chronic hemorrhagic and nonhemorrhagic lacunar infarcts. Incompletely evaluated LEFT ethmoid air cell opacity, possible developing mucocele. No proximal large vessel occlusion or flow-limiting stenosis. Intracranial atherosclerotic change most notable in the LEFT A1 ACA and LEFT P2 PCA, as well as BILATERAL MCA M2 and M3 vessels. Electronically Signed   By: Staci Righter M.D.   On: 10/23/2015 19:24   Mr Jodene Nam Head/brain Wo Cm  10/23/2015  CLINICAL DATA:  Dysarthria which began earlier today. Associated lightheadedness and gait instability. EXAM: MRI HEAD WITHOUT CONTRAST MRA HEAD WITHOUT CONTRAST TECHNIQUE: Multiplanar, multiecho pulse sequences of the brain and surrounding structures were obtained without intravenous contrast. Angiographic images of the head were obtained using MRA technique without contrast. COMPARISON:  CT head earlier today. MRI head 01/23/2015. MRI dated 11/16/2011. FINDINGS: MRI HEAD FINDINGS There is acute, RIGHT MCA territory lenticulostriate infarct affecting the lateral lentiform nucleus, and periventricular white matter. See for instance image 33 series 3, and image 20 series 9. No associated hemorrhage. No mass lesion, or extra-axial fluid. Global atrophy. Hydrocephalus ex vacuo. Extensive chronic microvascular ischemic change. Multiple hemorrhagic and nonhemorrhagic chronic lacunar infarcts, with scattered areas of susceptibility most notable in the RIGHT posterior lentiform nucleus, LEFT lateral lentiform nucleus, and BILATERAL subcortical white matter. Ovoid focus susceptibility LEFT frontal lobe superiorly, with encephalomalacia, can be seen as far back as 2013, potentially an area of old trauma. No midline abnormality. RIGHT cataract extraction appears uncomplicated. Possible mucocele of the LEFT ethmoid air cells, incompletely evaluated. No middle ear or mastoid fluid. MRA HEAD FINDINGS  Dolichoectatic but widely patent LEFT internal carotid artery. Mild non stenotic atheromatous change inferior cavernous segment RIGHT internal carotid artery otherwise widely patent vessel. Basilar artery minimally irregular in its proximal segment, with both vertebrals contributing, RIGHT dominant. Mild non stenotic atheromatous change above stools. Dolichoectatic, slightly irregular, but non stenotic M1 MCA segments. BILATERAL M2 and M3 vessel irregularity, consistent with intracranial atherosclerotic disease. 75% stenosis A1 ACA on the LEFT. Mild non stenotic irregularity of the RIGHT A1 ACA both distal anterior cerebral arteries are patent. No proximal posterior cerebral artery flow limiting stenosis. 75% stenosis of the proximal P2 segment on the LEFT. Mild irregularity of the LEFT greater than RIGHT superior cerebellar arteries. Anterior inferior cerebellar arteries poorly  visualized. RIGHT posterior inferior cerebellar artery robust ; LEFT is moderately diseased. IMPRESSION: Acute RIGHT MCA territory lenticulostriate infarct, nonhemorrhagic. Atrophy and small vessel disease. Scattered foci of chronic hemorrhagic and nonhemorrhagic lacunar infarcts. Incompletely evaluated LEFT ethmoid air cell opacity, possible developing mucocele. No proximal large vessel occlusion or flow-limiting stenosis. Intracranial atherosclerotic change most notable in the LEFT A1 ACA and LEFT P2 PCA, as well as BILATERAL MCA M2 and M3 vessels. Electronically Signed   By: Staci Righter M.D.   On: 10/23/2015 19:24    Assessment/Plan 1. Essential hypertension -blood pressure stable, conts on norvasc 10 mg, enalapril 20 mg daily, and ASA  2. Left hemiparesis ([place]) After CVA, working with therapy. No increase in pain or discomfort, using WC for mobility   3. Late effects of CVA (cerebrovascular accident) With aphagia, dysphagia and left sided weakness, pt following with vascular for carotid stenosis, conts on ASA 325 mg PO  daily  4. Malnutrition of moderate degree conts on nutritional support via PEG, weight gain noted. conts to follow with ST at this time  5. Hyperlipidemia -LDL at 80, conts on Lipitor 40 mg PO qhs  6. Type 2 diabetes mellitus with other circulatory complication, without long-term current use of insulin (HCC) A1c stable, not currently on medication for diabetes, will cont to monitor. Will order referrals for diabetic foot and eye exams.     Carlos American. Harle Battiest  [facility] [phone] 8 am - 5 pm) [phone] (after hours)

## 2015-12-10 LAB — BASIC METABOLIC PANEL
BUN: 40 mg/dL — AB (ref 4–21)
Creatinine: 1.3 mg/dL (ref 0.6–1.3)
GLUCOSE: 97 mg/dL
Potassium: 4.5 mmol/L (ref 3.4–5.3)
Sodium: 141 mmol/L (ref 137–147)

## 2015-12-10 LAB — CBC AND DIFFERENTIAL
HEMATOCRIT: 34 % — AB (ref 41–53)
HEMOGLOBIN: 10.9 g/dL — AB (ref 13.5–17.5)
PLATELETS: 282 10*3/uL (ref 150–399)
WBC: 7.9 10*3/mL

## 2015-12-14 ENCOUNTER — Ambulatory Visit (HOSPITAL_COMMUNITY)
Admission: RE | Admit: 2015-12-14 | Discharge: 2015-12-14 | Disposition: A | Discharge status: Home / Self Care | Payer: Non-veteran care | Source: Ambulatory Visit | Attending: Internal Medicine | Admitting: Internal Medicine

## 2015-12-14 ENCOUNTER — Ambulatory Visit (HOSPITAL_COMMUNITY)
Admission: RE | Admit: 2015-12-14 | Discharge: 2015-12-14 | Disposition: A | Discharge status: Home / Self Care | Payer: PRIVATE HEALTH INSURANCE | Source: Ambulatory Visit | Attending: Internal Medicine | Admitting: Internal Medicine

## 2015-12-14 DIAGNOSIS — R1310 Dysphagia, unspecified: Secondary | ICD-10-CM

## 2015-12-14 DIAGNOSIS — E785 Hyperlipidemia, unspecified: Secondary | ICD-10-CM | POA: Insufficient documentation

## 2015-12-14 DIAGNOSIS — R1312 Dysphagia, oropharyngeal phase: Secondary | ICD-10-CM | POA: Insufficient documentation

## 2015-12-14 DIAGNOSIS — I69391 Dysphagia following cerebral infarction: Secondary | ICD-10-CM | POA: Insufficient documentation

## 2015-12-14 DIAGNOSIS — E119 Type 2 diabetes mellitus without complications: Secondary | ICD-10-CM | POA: Insufficient documentation

## 2015-12-14 DIAGNOSIS — I10 Essential (primary) hypertension: Secondary | ICD-10-CM | POA: Insufficient documentation

## 2015-12-14 DIAGNOSIS — Z8673 Personal history of transient ischemic attack (TIA), and cerebral infarction without residual deficits: Secondary | ICD-10-CM | POA: Insufficient documentation

## 2015-12-28 ENCOUNTER — Encounter: Payer: Self-pay | Admitting: Neurology

## 2015-12-28 ENCOUNTER — Ambulatory Visit (INDEPENDENT_AMBULATORY_CARE_PROVIDER_SITE_OTHER): Payer: Non-veteran care | Admitting: Neurology

## 2015-12-28 ENCOUNTER — Encounter: Payer: Self-pay | Admitting: Internal Medicine

## 2015-12-28 ENCOUNTER — Non-Acute Institutional Stay (SKILLED_NURSING_FACILITY): Payer: Medicare Other | Admitting: Internal Medicine

## 2015-12-28 VITALS — BP 114/70 | HR 52 | Ht 69.0 in

## 2015-12-28 DIAGNOSIS — K922 Gastrointestinal hemorrhage, unspecified: Secondary | ICD-10-CM

## 2015-12-28 DIAGNOSIS — I69921 Dysphasia following unspecified cerebrovascular disease: Secondary | ICD-10-CM

## 2015-12-28 DIAGNOSIS — Z8673 Personal history of transient ischemic attack (TIA), and cerebral infarction without residual deficits: Secondary | ICD-10-CM

## 2015-12-28 DIAGNOSIS — I69391 Dysphagia following cerebral infarction: Secondary | ICD-10-CM

## 2015-12-28 DIAGNOSIS — G8194 Hemiplegia, unspecified affecting left nondominant side: Secondary | ICD-10-CM

## 2015-12-28 DIAGNOSIS — Z720 Tobacco use: Secondary | ICD-10-CM

## 2015-12-28 DIAGNOSIS — E785 Hyperlipidemia, unspecified: Secondary | ICD-10-CM

## 2015-12-28 DIAGNOSIS — I6990 Unspecified sequelae of unspecified cerebrovascular disease: Secondary | ICD-10-CM

## 2015-12-28 DIAGNOSIS — I63311 Cerebral infarction due to thrombosis of right middle cerebral artery: Secondary | ICD-10-CM | POA: Diagnosis not present

## 2015-12-28 DIAGNOSIS — I10 Essential (primary) hypertension: Secondary | ICD-10-CM

## 2015-12-28 NOTE — Patient Instructions (Addendum)
-   continue ASA and lipitor for stroke prevention - will refer to Dr. Anselmo Pickler for rhab consultation - check BP at facility daily and record - aggressive PT/OT/speech in facility - Follow up with your primary care physician for stroke risk factor modification. Recommend maintain blood pressure goal 130-150/80, diabetes with hemoglobin A1c goal below 7.0% and lipids with LDL cholesterol goal below 70 mg/dL.  - follow up with vascular surgery to monitor carotid artery narrowing - continue abstain from smoking - follow up in 4 months.

## 2015-12-28 NOTE — Assessment & Plan Note (Addendum)
Resume bolus feedings Recheck anemia status BMET and LFT to assess exacerbation of weakness,dysphagia and aphasia Dr Erlinda Hong will be notified of changes in status as per SNF staff

## 2015-12-28 NOTE — Assessment & Plan Note (Addendum)
  CBC Reinitiation of Plavix would be deferred to Dr.Xu

## 2015-12-28 NOTE — Progress Notes (Signed)
    Facility Location: [facility]  Room Number: 107  This is a nursing facility follow up for specific acute issue of exacerbation of dysphagia ,aphasia and weakness noted 8/21 . His primary nurse describes increased falls, unintelligible speech , and increasing weakness as per Physical Therapy after initial clinical improvement in these parameters.  He was admitted to the SNF 7/ 3 after being hospitalized 6/17-7/3 with an acute right middle cerebral artery territory lenticulostriate infarct associated with dysphagia and dysarthria. Hospitalization was complicated by GI bleed manifested as hematemesis and bloody diarrhea requiring discontinuation of his aspirin and Plavix. Upper endoscopy performed 6/28 revealed red blood and clots in the gastric body and fundus with large clot burden. Intact gastrostomy & patent G-tube were present in the gastric body. Bleeding was noted coming from behind the bumper. The G-tube site was tightened to tamponade the bleeding. Serial blood counts showed that the anemia has improved. On 7/3 hemoglobin was 9.  With improvement in his dysphagia bolus feeding was discontinued. He now is described as having decreased mental alertness and choking with food intake. Although he had dysarthria speech was intelligible until 8/21.  The patient was not available 12/28/15 because he was at his Neurologist's office for reassessment by Dr. Erlinda Hong who recommended Rehab consultation with Dr. Letta Pate, continuing aspirin and Lipitor, aggressive PT/OT/speech therapy at. Blood pressure goal was to be 130-150/80. A1c goal is less than 7%. LDL goal is less than 70.  He has diet-controlled diabetes by history.  A1c was 6.1% on 10/23/15, a pre Diabetes value.   Review of systems: Aphasia prevented oral responses. He was able to write down that his dysphagia is "not ever time"  Physical exam:  Pertinent or positive findings: As noted he is aphasic and nonverbal. He  has constant drooling. Cheeks are sunken Sclerae are muddy. Teeth are coated and faint yellow.  Breath sounds are decreased. There is a percutaneous gastrostomy tube in the abdomen. Pedal pulses are decreased. He is unable to move left upper extremity and there is minimal range of motion of the left lower extremity.  General appearance:Adequately nourished; no acute distress , increased work of breathing is present.   Lymphatic: No lymphadenopathy about the head, neck, axilla . Eyes: No conjunctival inflammation or lid edema is present. There is no definite scleral icterus. Ears:  External ear exam shows no significant lesions or deformities.   Nose:  External nasal examination shows no deformity or inflammation. Nasal mucosa are pink and moist without lesions ,exudates Oral exam: lips and gums are healthy appearing. Neck:  No thyromegaly, masses, tenderness noted.    Heart:  Normal rate and regular rhythm. S1 and S2 normal without gallop, murmur, click, rub .  Lungs:Chest clear to auscultation without wheezes, rhonchi,rales , rubs. Abdomen:Bowel sounds are normal. Abdomen is soft and nontender with no organomegaly, hernias,masses. GU: deferred  Extremities:  No cyanosis, clubbing,edema  Neurologic exam : Balance,Rhomberg,finger to nose testing could not be completed due to clinical state Skin: Warm & dry w/o tenting. No significant lesions or rash.    See summary under each active problem in the Problem List with associated updated therapeutic plan

## 2015-12-29 NOTE — Assessment & Plan Note (Signed)
Dr Erlinda Hong recommended Rehab consult with Dr Letta Pate

## 2015-12-29 NOTE — Patient Instructions (Addendum)
Dr Erlinda Hong will be notified of changes in status @ [facility]  8/21.

## 2015-12-29 NOTE — Progress Notes (Signed)
STROKE NEUROLOGY FOLLOW UP NOTE  NAME: Sean Collins DOB: January 21, 1951  REASON FOR VISIT: stroke follow up HISTORY FROM: son and chart  Today we had the pleasure of seeing Sean Collins in follow-up at our [facility]. Pt was accompanied by son.   History Summary Sean Collins is a 65 y.o. male with history of HTN, HLD, pre-DM, previous stroke, MI s/p stent, current smoker was admitted on 10/23/15 for slurry speech and gait imbalance. MRI found to have right BG/CR punctate infarcts. MRA showed diffuse athero including right M2, BA, bilateral PCA. Carotid Doppler 40-59% right ICA stenosis. CT of the neck showed right ICA 50% stenosis with soft plaques, left ICA 40% stenosis and mild bilateral VA stenosis. TTE with EF 55-60%, and DVT negative. LDL 83 and A1c 6.1. Head aspirin 81 and Plavix PTA, increase to aspirin 325 and continue Plavix for both cardiac and stroke prevention. Resumed Lipitor. During the admission, patient symptoms getting worse, progress to left hemiplegia and severe dysarthria and dysphagia. MRI showed more prominent right BG/CR infarction. Failed swallow and CXR concerning for bilateral aspiration pneumonia, put on Unasyn and underwent PEG placement. VVS consulted and recommended follow-up as outpatient to consider right CEA. He subsequently had hematemesis (2 cups worth) and melena and was transferred to the ICU. He received 2 units PRBCs. GI performed an EGD on 6/28 which revealed active bleeding in the gastric body related to the PEG tube site. The bleeding resolved after the PEG tube site was tightened. His hemoglobin has remained stable in the 9 range and he was transferred back to [facility] . After 2 days of him being stable, Aspirin and Plavix restarted but has one episode of bloody BM at night. Both ASA and plavix were held again with Aspirin resumed the second day but not Plavix.Patient was later discharge to SNF with PEG and tube feeding and ASA and  lipitor.   History of stroke  11/2011 - left CR - MRA diffuse athero - CUS and TTE negative - on ASA  01/2015 - right semi ovale punctate infarcts - MRA diffuse athero - CUS and TTE negative - LDL 180 and A1C 6.3 - ASA + plavix + zocor  Interval History During the interval time, the patient has been doing stable. He still has severe dysarthria and intelligible words, has to communicate with writing. Excessive saliva and not able to swallow. Still has PEG for bolus feed. As per son and note, on 12/27/15 "With improvement in his dysphagia, bolus feeding was discontinued. He now is described as having decreased mental alertness and choking with food intake". His mental status change could be related to aspiration. Today in clinic, he has not mental status change, able to communicate with me via writing, asking "for better food" "need to change the cook" "my arm is not strong". I do not think he is safe to eat at this time and he certainly needs bolus feed via PEG to maintain enough nutrition. BP 114/70.   REVIEW OF SYSTEMS: Full 14 system review of systems performed and notable only for those listed below and in HPI above, all others are negative:  Constitutional:   Cardiovascular:  Ear/Nose/Throat:  Trouble swallowing Skin: rash Eyes:  Blurry vision Respiratory:   Gastroitestinal:   Genitourinary:  Hematology/Lymphatic:   Endocrine:  Musculoskeletal:  Walking difficulty Allergy/Immunology:   Neurological:   Psychiatric:  Sleep: snoring  The following represents the patient's updated allergies and side effects list: No Known Allergies  The neurologically relevant items on the patient's problem list were reviewed on today's visit.  Neurologic Examination  A problem focused neurological exam (12 or more points of the single system neurologic examination, vital signs counts as 1 point, cranial nerves count for 8 points) was performed.  Blood pressure 114/70, pulse (!) 52, height 5\' 9"   (1.753 m).  General - Well nourished, well developed, upset about severe dysarthria and excessive saliva.  Ophthalmologic - Fundi not visualized due to noncooperation.  Cardiovascular - Regular rate and rhythm with no murmur.  Mental Status -  Awake, alert, severe dysarthria with intelligible words, not able to answer orientation questions. Follows commands, able to write for communication. Since able to name and repeat, however with intelligible words.  Cranial Nerves II - XII - II - Visual field intact OU. III, IV, VI - Extraocular movements intact. V - Facial sensation intact bilaterally. VII - mild left facial droop. VIII - Hearing & vestibular intact bilaterally. X - Palate elevation impaired, with severe dysarthria and intelligible words. XI - Chin turning & shoulder shrug intact bilaterally. XII - Tongue protrusion not able to do.  Motor Strength - The patient's strength was 0/5 LUE and 2/5 LLE proximal, 3/5 knee extension with brace at left foot and 5/5 RUE and RLE.  Bulk was normal and fasciculations were absent.   Motor Tone - Muscle tone was assessed at the neck and appendages and was decreased on the left.  Reflexes - The patient's reflexes were 1+ in all extremities and he had no pathological reflexes.  Sensory - Light touch, temperature/pinprick were assessed and were normal.    Coordination - The patient had normal movements in the right hand with no ataxia or dysmetria.  Tremor was absent.  Gait and Station - not tested, in the wheelchair.   Functional score  mRS = 4   0 - No symptoms.   1 - No significant disability. Able to carry out all usual activities, despite some symptoms.   2 - Slight disability. Able to look after own affairs without assistance, but unable to carry out all previous activities.   3 - Moderate disability. Requires some help, but able to walk unassisted.   4 - Moderately severe disability. Unable to attend to own bodily needs  without assistance, and unable to walk unassisted.   5 - Severe disability. Requires constant nursing care and attention, bedridden, incontinent.   6 - Dead.   NIH Stroke Scale   Level Of Consciousness 0=Alert; keenly responsive 1=Not alert, but arousable by minor stimulation 2=Not alert, requires repeated stimulation 3=Responds only with reflex movements 0  LOC Questions to Month and Age 37=Answers both questions correctly 1=Answers one question correctly 2=Answers neither question correctly 2  LOC Commands      -Open/Close eyes     -Open/close grip 0=Performs both tasks correctly 1=Performs one task correctly 2=Performs neighter task correctly 0  Best Gaze 0=Normal 1=Partial gaze palsy 2=Forced deviation, or total gaze paresis 0  Visual 0=No visual loss 1=Partial hemianopia 2=Complete hemianopia 3=Bilateral hemianopia (blind including cortical blindness) 0  Facial Palsy 0=Normal symmetrical movement 1=Minor paralysis (asymmetry) 2=Partial paralysis (lower face) 3=Complete paralysis (upper and lower face) 1  Motor  0=No drift, limb holds posture for full 10 seconds 1=Drift, limb holds posture, no drift to bed 2=Some antigravity effort, cannot maintain posture, drifts to bed 3=No effort against gravity, limb falls 4=No movement Right Arm 0     Leg 0    Left Arm  4     Leg 3  Limb Ataxia 0=Absent 1=Present in one limb 2=Present in two limbs 0  Sensory 0=Normal 1=Mild to moderate sensory loss 2=Severe to total sensory loss 0  Best Language 0=No aphasia, normal 1=Mild to moderate aphasia 2=Mute, global aphasia 3=Mute, global aphasia 1  Dysarthria 0=Normal 1=Mild to moderate 2=Severe, unintelligible or mute/anarthric 2  Extinction/Neglect 0=No abnormality 1=Extinction to bilateral simultaneous stimulation 2=Profound neglect 0  Total   13     Data reviewed: I personally reviewed the images and agree with the radiology interpretations.  Ct Head Wo  Contrast 10/23/2015   No acute intracranial process. Chronic microvascular ischemic changes and cortical atrophy.   Mri & Mra Head/brain Wo Cm 10/23/2015   Acute RIGHT MCA territory lenticulostriate infarct, nonhemorrhagic. Atrophy and small vessel disease. Scattered foci of chronic hemorrhagic and nonhemorrhagic lacunar infarcts. Incompletely evaluated LEFT ethmoid air cell opacity, possible developing mucocele.  No proximal large vessel occlusion or flow-limiting stenosis. Intracranial atherosclerotic change most notable in the LEFT A1 ACA and LEFT P2 PCA, as well as BILATERAL MCA M2 and M3 vessels.  CTA neck 50% diameter stenosis at the origin of the right internal carotid artery. 15 mm distally there is another 50% diameter stenosis due to noncalcified atherosclerotic plaque. 40% diameter stenosis proximal left internal carotid artery. Mild stenosis at the proximal vertebral artery bilaterally. Both vertebral arteries patent.  CUS - Right: 40-59% ICA stenosis (mid to distal),highest end of scale, however, higher velocities may be obscured as patient has high bifurcation and significant plaquing. Left: 1-39% ICA stenosis. Unable to adequately visualize the distal ICA secondary to high bifurcation. Bilateral vertebral artery flow is antegrade.   LE venous doppler - There is no DVT or SVT noted in the bilateral lower extremities.   TTE  - Left ventricle: The cavity size was normal. Wall thickness was normal. Systolic function was normal. The estimated ejection fraction was in the range of 55% to 60%. Wall motion was normal; there were no regional wall motion abnormalities. Left ventricular diastolic function parameters were normal. - Mitral valve: Calcified annulus. Mildly thickened leaflets . - Left atrium: The atrium was mildly dilated. - Right atrium: The atrium was mildly dilated. - Atrial septum: No defect or patent foramen ovale was identified. - Tricuspid valve: There was  moderate regurgitation. - Pulmonary arteries: PA peak pressure: 55 mm Hg (S). - Pericardium, extracardiac: A trivial pericardial effusion was identified.  Ct Angio Neck W Or Wo Contrast 10/24/2015  IMPRESSION: 50% diameter stenosis at the origin of the right internal carotid artery. 15 mm distally there is another 50% diameter stenosis due to noncalcified atherosclerotic plaque. 40% diameter stenosis proximal left internal carotid artery. Mild stenosis at the proximal vertebral artery bilaterally. Both vertebral arteries patent.    Mr Brain Wo Contrast 10/25/2015  IMPRESSION: Interval enlargement of right posterior corona radiata/ posterior right lenticular nucleus nonhemorrhagic infarct. Remote left frontal lobe infarct with encephalomalacia. Remote corona radiata and basal ganglia infarcts bilaterally. Moderate global atrophy without hydrocephalus. Complex persistent opacification mid to posterior left ethmoid sinus air cells. Mild mucosal thickening remainder of ethmoid sinus air cells. Minimal to mild mucosal thickening frontal sinuses. Minimal maxillary sinus mucosal thickening.  Component     Latest Ref Rng & Units 10/23/2015 10/24/2015  Cholesterol     0 - 200 mg/dL  138  Triglycerides     <150 mg/dL  59  HDL Cholesterol     >40 mg/dL  43  Total CHOL/HDL Ratio  RATIO  3.2  VLDL     0 - 40 mg/dL  12  LDL (calc)     0 - 99 mg/dL  83  Hemoglobin A1C     4.8 - 5.6 % 6.1 (H)   Mean Plasma Glucose     mg/dL 128     Assessment: As you may recall, he is a 65 y.o. African American male with PMH of HTN, HLD, pre-DM, previous stroke at left CR in 2013 and right SO in 2016, MI s/p stent, smoker was admitted on 10/23/15 for right BG/CR punctate infarcts. MRA showed diffuse athero including right M2, BA, bilateral PCA. Carotid Doppler 40-59% right ICA stenosis. CT of the neck showed right ICA 50% stenosis with soft plaques, left ICA 40% stenosis and mild bilateral VA stenosis. TTE with EF  55-60%, and DVT negative. LDL 83 and A1c 6.1. Had aspirin 81 and Plavix PTA, increase to aspirin 325 and continue Plavix for both cardiac and stroke prevention. Resumed Lipitor. During the admission, patient symptoms getting worse, progress to left hemiplegia and severe dysarthria and dysphagia. MRI showed more prominent right BG/CR infarction. Failed swallow and underwent PEG placement. VVS consulted and recommended follow-up as outpatient to consider right CEA in the future. He subsequently developed GI bleeding requiring blood transfusion. Aspirin resumed but not Plavix.Patient was later discharge to SNF with PEG and tube feeding along with ASA and lipitor. During interval time, he still has severe dysarthria and intelligible words, has to communicate with writing. Excessive saliva and not able to swallow. Still has PEG for bolus feed. No need to resume plavix at this time.   As per note, on 12/27/15 "With improvement in his dysphagia, bolus feeding was discontinued. He now is described as having decreased mental alertness and choking with food intake". His mental status change could be related to aspiration. Today in clinic, he has not mental status change, able to communicate with me via writing, asking "for better food" "need to change the cook" "my arm is not strong". I do not think he is safe to eat at this time and he certainly needs bolus feed via PEG to maintain enough nutrition.   Plan:  - continue ASA and lipitor for stroke prevention. No need to resume plavix at this time. - will refer to Dr. Anselmo Pickler for rhab consultation - check BP at facility daily and record - aggressive PT/OT/speech in facility - pt still not safe to swallow and needs continue speech evaluation. Recommend to continue bolus feed for nutrition until further improvement and able to have sufficient nutrition from po.  - Follow up with your primary care physician for stroke risk factor modification. Recommend maintain blood  pressure goal 130-150/80, diabetes with hemoglobin A1c goal below 7.0% and lipids with LDL cholesterol goal below 70 mg/dL.  - follow up with vascular surgery to monitor carotid artery stenosis. - continue abstain from smoking - follow up in 4 months.   I spent more than 25 minutes of face to face time with the patient. Greater than 50% of time was spent in counseling and coordination of care. We discussed tube feeding, speech / swallow evaluation, and refer to Dr. Letta Pate.    Orders Placed This Encounter  Procedures  . Ambulatory referral to Physical Medicine Rehab    Referral Priority:   Routine    Referral Type:   Rehabilitation    Referral Reason:   Specialty Services Required    Referred to Provider:   Charlett Blake,  MD    Requested Specialty:   Physical Medicine and Rehabilitation    Number of Visits Requested:   1    No orders of the defined types were placed in this encounter.   Patient Instructions  - continue ASA and lipitor for stroke prevention - will refer to Dr. Anselmo Pickler for rhab consultation - check BP at facility daily and record - aggressive PT/OT/speech in facility - Follow up with your primary care physician for stroke risk factor modification. Recommend maintain blood pressure goal 130-150/80, diabetes with hemoglobin A1c goal below 7.0% and lipids with LDL cholesterol goal below 70 mg/dL.  - follow up with vascular surgery to monitor carotid artery narrowing - continue abstain from smoking - follow up in 4 months.    Rosalin Hawking, MD PhD [facility] [address], [place], [place] [address] [phone]

## 2015-12-30 LAB — BASIC METABOLIC PANEL
BUN: 22 mg/dL — AB (ref 4–21)
CREATININE: 1.2 mg/dL (ref 0.6–1.3)
GLUCOSE: 74 mg/dL
POTASSIUM: 4 mmol/L (ref 3.4–5.3)
SODIUM: 140 mmol/L (ref 137–147)

## 2015-12-30 LAB — HEPATIC FUNCTION PANEL
ALT: 16 U/L (ref 10–40)
AST: 16 U/L (ref 14–40)
Alkaline Phosphatase: 81 U/L (ref 25–125)
Bilirubin, Direct: 0.1 mg/dL (ref 0.01–0.4)
Bilirubin, Total: 0.7 mg/dL

## 2015-12-30 LAB — CBC AND DIFFERENTIAL
HCT: 35 % — AB (ref 41–53)
HEMOGLOBIN: 11.3 g/dL — AB (ref 13.5–17.5)
Platelets: 289 10*3/uL (ref 150–399)
WBC: 8.3 10^3/mL

## 2016-01-04 ENCOUNTER — Encounter: Payer: Self-pay | Admitting: Vascular Surgery

## 2016-01-07 ENCOUNTER — Encounter: Payer: Self-pay | Admitting: Nurse Practitioner

## 2016-01-07 ENCOUNTER — Non-Acute Institutional Stay (SKILLED_NURSING_FACILITY): Payer: Medicare Other | Admitting: Nurse Practitioner

## 2016-01-07 DIAGNOSIS — M109 Gout, unspecified: Secondary | ICD-10-CM

## 2016-01-07 DIAGNOSIS — I10 Essential (primary) hypertension: Secondary | ICD-10-CM

## 2016-01-07 DIAGNOSIS — I69921 Dysphasia following unspecified cerebrovascular disease: Secondary | ICD-10-CM | POA: Diagnosis not present

## 2016-01-07 DIAGNOSIS — I6990 Unspecified sequelae of unspecified cerebrovascular disease: Secondary | ICD-10-CM | POA: Diagnosis not present

## 2016-01-07 DIAGNOSIS — I69391 Dysphagia following cerebral infarction: Secondary | ICD-10-CM

## 2016-01-07 DIAGNOSIS — E785 Hyperlipidemia, unspecified: Secondary | ICD-10-CM | POA: Diagnosis not present

## 2016-01-07 DIAGNOSIS — E1159 Type 2 diabetes mellitus with other circulatory complications: Secondary | ICD-10-CM

## 2016-01-07 NOTE — Progress Notes (Signed)
Nursing Home Location:  [facility]  Place of Service: SNF (31)  PCP: Unice Cobble, MD  No Known Allergies  Chief Complaint  Patient presents with  . Medical Management of Chronic Issues    Routine Visit    HPI:  Patient is a 65 y.o. male seen today at [facility] to follow up on chronic conditions. Pt with hx of CVA, arthrosclerosis, CAD, htn, DM, hyperlipidemia. Pt with multiple stroke history due to atherosclerosis. Had recent right MCA infarct and now with dysarthria and dysphagia and left sided weakness. HPI and ROS limited due to aphagia. In the last month pt has had worsening aphagia and dysphagia. Follow up with neurology last week and it was decided pt needs to remain NPO with nutrition via PEG and cont ST.  Follow up with vascular surgery scheduled to monitor carotid artery stenosis.  Anemia improved on recent labs.  Pt with ongoing falls because he attempts transfers without help. No injuries noted.  Staff without new concerns today, pt has no new complaints   Review of Systems:  Review of Systems  Unable to perform ROS: Other    Past Medical History:  Diagnosis Date  . Diabetes mellitus without complication ([place])   . Dysarthria   . Dysphagia   . GI bleed   . Hyperlipidemia   . Hypertension   . Stroke [facility])    Past Surgical History:  Procedure Laterality Date  . ESOPHAGOGASTRODUODENOSCOPY (EGD) WITH PROPOFOL N/A 11/03/2015   Procedure: ESOPHAGOGASTRODUODENOSCOPY (EGD) WITH PROPOFOL;  Surgeon: Manus Gunning, MD;  Location: [place];  Service: Gastroenterology;  Laterality: N/A;   Social History:   reports that he has quit smoking. His smoking use included Cigarettes. He has a 16.00 pack-year smoking history. He has never used smokeless tobacco. He reports that he does not drink alcohol or use drugs.  Family History  Problem Relation Age of Onset  . Stroke Brother   . Diabetes Brother   . Stroke Brother   . Stroke Maternal  Aunt     Medications: Patient's Medications  New Prescriptions   No medications on file  Previous Medications   ACETAMINOPHEN (TYLENOL) 650 MG CR TABLET    Take 650 mg by mouth every 4 (four) hours as needed for pain or fever.   ALLOPURINOL (ZYLOPRIM) 300 MG TABLET    Take 300 mg by mouth daily.   AMLODIPINE (NORVASC) 10 MG TABLET    Take 1 tablet (10 mg total) by mouth daily.   ASPIRIN EC 325 MG EC TABLET    Take 1 tablet (325 mg total) by mouth daily.   ATENOLOL (TENORMIN) 50 MG TABLET    Take 1 tablet (50 mg total) by mouth 2 (two) times daily.   ATORVASTATIN (LIPITOR) 40 MG TABLET    Take 1 tablet (40 mg total) by mouth daily at 6 PM.   CARBOXYMETHYLCELLULOSE (REFRESH PLUS) 0.5 % SOLN    Place 1 drop into both eyes 3 (three) times daily as needed (dry eyes).   CHOLECALCIFEROL (VITAMIN D) 1000 UNITS TABLET    Take 1,000 Units by mouth daily.   ENALAPRIL (VASOTEC) 20 MG TABLET    Take 1 tablet (20 mg total) by mouth daily.   FOLIC ACID (FOLVITE) 1 MG TABLET    Take 1 mg by mouth daily.   NUTRITIONAL SUPPLEMENTS (ISOSOURCE 1.5 CAL) LIQD    Give 2 cans three times daily bolus via PEG tub with 30 ml flushes before and after  feeding and medications. This will provide 2250 Kcal per day.   PANTOPRAZOLE SODIUM (PROTONIX) 40 MG/20 ML PACK    Place 20 mLs (40 mg total) into feeding tube 2 (two) times daily.   UNABLE TO FIND    Med Name: Magic Cup by mouth three times daily  Modified Medications   No medications on file  Discontinued Medications   No medications on file     Physical Exam: Vitals:   01/07/16 1139  BP: 134/82  Pulse: 67  Resp: 20  Temp: 97.8 F (36.6 C)  SpO2: 95%  Weight: 156 lb 3.2 oz (70.9 kg)  Height: 5\' 9"  (1.753 m)    Physical Exam  Constitutional: He appears well-developed and well-nourished. No distress.  HENT:  Head: Normocephalic and atraumatic.  Mouth/Throat: Oropharynx is clear and moist. No oropharyngeal exudate.  Left sided facial droop  Eyes:  Conjunctivae and EOM are normal. Pupils are equal, round, and reactive to light.  Neck: Normal range of motion. Neck supple.  Cardiovascular: Normal rate, regular rhythm and normal heart sounds.   Pulmonary/Chest: Effort normal and breath sounds normal.  Abdominal: Soft. Bowel sounds are normal.  PEG tube intact   Musculoskeletal: He exhibits no edema or tenderness.  Neurological: He is alert.  Left sided hemiplegia    Skin: Skin is warm and dry. He is not diaphoretic.  Psychiatric: He has a normal mood and affect.    Labs reviewed: Basic Metabolic Panel:  Recent Labs  11/04/15 0416 11/05/15 0614 11/06/15 0515 11/07/15 0520 11/12/15 12/10/15 12/30/15  NA 144 144 145 144 139 141 140  K 4.0 3.7 3.7 4.3 4.2 4.5 4.0  CL 115* 112* 113* 112*  --   --   --   CO2 22 24 24 27   --   --   --   GLUCOSE 99 101* 128* 143*  --   --   --   BUN 42* 28* 26* 24* 29* 40* 22*  CREATININE 1.45* 1.26* 1.35* 1.16 1.3 1.3 1.2  CALCIUM 8.6* 9.0 9.0 7.4*  --   --   --   MG 2.0 2.0 2.2  --   --   --   --   PHOS 3.8 3.7 3.8  --   --   --   --    Liver Function Tests:  Recent Labs  01/22/15 1757 10/23/15 1442 11/04/15 0416 11/05/15 0614 11/06/15 0515 12/30/15  AST 24 23  --   --   --  16  ALT 16* 17  --   --   --  16  ALKPHOS 84 71  --   --   --  81  BILITOT 0.4 0.4  --   --   --   --   PROT 7.4 7.1  --   --   --   --   ALBUMIN 3.3* 3.4* 2.2* 2.5* 2.4*  --    No results for input(s): LIPASE, AMYLASE in the last 8760 hours. No results for input(s): AMMONIA in the last 8760 hours. CBC:  Recent Labs  11/04/15 0416  11/05/15 0614  11/06/15 0515 11/07/15 0520 11/08/15 0526 11/12/15 12/10/15 12/30/15  WBC 12.5*  < > 15.8*  --  15.7* 12.5* 12.9* 13.3 7.9 8.3  NEUTROABS 8.7*  --  12.1*  --  12.4*  --   --   --   --   --   HGB 6.5*  < > 9.4*  < > 9.3* 8.7* 9.0* 9.9* 10.9*  11.3*  HCT 20.3*  < > 28.6*  < > 28.4* 26.5* 28.5* 31* 34* 35*  MCV 83.5  < > 82.7  --  85.3 84.7 85.1  --   --   --     PLT 267  < > 303  --  322 372 424* 544* 282 289  < > = values in this interval not displayed. TSH: No results for input(s): TSH in the last 8760 hours. A1C: Lab Results  Component Value Date   HGBA1C 6.1 (H) 10/23/2015   Lipid Panel:  Recent Labs  01/23/15 0932 10/24/15 0502  CHOL 233* 138  HDL 41 43  LDLCALC 180* 83  TRIG 62 59  CHOLHDL 5.7 3.2    Radiological Exams: Ct Head Wo Contrast  10/23/2015  CLINICAL DATA:  Patient with sudden onset slurred speech and dizziness. Code stroke. EXAM: CT HEAD WITHOUT CONTRAST TECHNIQUE: Contiguous axial images were obtained from the base of the skull through the vertex without intravenous contrast. COMPARISON:  MRI brain 01/23/2015; CT brain 01/22/2015. FINDINGS: Ventricles and sulci are prominent for patient's age. Old left frontal lobe encephalomalacia. Periventricular and subcortical white matter hypodensity compatible with chronic microvascular ischemic changes. No evidence for large territory infarct, intracranial hemorrhage, mass lesion or mass-effect. Orbits are unremarkable. Ethmoid air cell mucosal thickening. Mastoid air cells unremarkable. Calvarium is intact. IMPRESSION: No acute intracranial process. Chronic microvascular ischemic changes and cortical atrophy. Critical Value/emergent results were called by telephone at the time of interpretation on 10/23/2015 at 3:03 pm to Dr. Barbee Cough, who verbally acknowledged these results. Electronically Signed   By: Lovey Newcomer M.D.   On: 10/23/2015 15:12   Ct Angio Neck W Or Wo Contrast  10/24/2015  CLINICAL DATA:  Stroke.  Right MCA infarct. EXAM: CT ANGIOGRAPHY NECK TECHNIQUE: Multidetector CT imaging of the neck was performed using the standard protocol during bolus administration of intravenous contrast. Multiplanar CT image reconstructions and MIPs were obtained to evaluate the vascular anatomy. Carotid stenosis measurements (when applicable) are obtained utilizing NASCET criteria, using the  distal internal carotid diameter as the denominator. CONTRAST:  50 mL Isovue 370 IV COMPARISON:  MRI head 10/23/2015 FINDINGS: Aortic arch: Atherosclerotic calcification in the aortic arch. Negative for aneurysm or dissection. Atherosclerotic calcification the proximal great vessels which are widely patent without significant stenosis. Lung apices clear. Right carotid system: Right common carotid artery widely patent. Atherosclerotic calcification proximal right internal carotid artery narrowing the lumen by 50% diameter stenosis. Additional noncalcified stenosis 15 mm distal to the bifurcation also narrowing the lumen by 50% diameter stenosis. Right external carotid artery mild stenosis proximally. Left carotid system: Mild atherosclerotic disease in the left common carotid artery without significant stenosis. Mild atherosclerotic calcification proximal left internal carotid artery. 40% diameter stenosis proximal left internal carotid artery. Left external carotid artery widely patent. Vertebral arteries:Mild stenosis proximal right vertebral artery. Remainder of the right vertebral artery is patent to the basilar. Mild stenosis proximal left vertebral artery without additional stenosis. Left vertebral artery patent to the basilar. Skeleton: Disc degeneration and spondylosis. Facet degeneration. No fracture or acute skeletal abnormality. Other neck: No adenopathy in the neck. IMPRESSION: 50% diameter stenosis at the origin of the right internal carotid artery. 15 mm distally there is another 50% diameter stenosis due to noncalcified atherosclerotic plaque. 40% diameter stenosis proximal left internal carotid artery Mild stenosis at the proximal vertebral artery bilaterally. Both vertebral arteries patent. Electronically Signed   By: Franchot Gallo M.D.   On: 10/24/2015 18:02  Mr Brain Wo Contrast  10/23/2015  CLINICAL DATA:  Dysarthria which began earlier today. Associated lightheadedness and gait instability.  EXAM: MRI HEAD WITHOUT CONTRAST MRA HEAD WITHOUT CONTRAST TECHNIQUE: Multiplanar, multiecho pulse sequences of the brain and surrounding structures were obtained without intravenous contrast. Angiographic images of the head were obtained using MRA technique without contrast. COMPARISON:  CT head earlier today. MRI head 01/23/2015. MRI dated 11/16/2011. FINDINGS: MRI HEAD FINDINGS There is acute, RIGHT MCA territory lenticulostriate infarct affecting the lateral lentiform nucleus, and periventricular white matter. See for instance image 33 series 3, and image 20 series 9. No associated hemorrhage. No mass lesion, or extra-axial fluid. Global atrophy. Hydrocephalus ex vacuo. Extensive chronic microvascular ischemic change. Multiple hemorrhagic and nonhemorrhagic chronic lacunar infarcts, with scattered areas of susceptibility most notable in the RIGHT posterior lentiform nucleus, LEFT lateral lentiform nucleus, and BILATERAL subcortical white matter. Ovoid focus susceptibility LEFT frontal lobe superiorly, with encephalomalacia, can be seen as far back as 2013, potentially an area of old trauma. No midline abnormality. RIGHT cataract extraction appears uncomplicated. Possible mucocele of the LEFT ethmoid air cells, incompletely evaluated. No middle ear or mastoid fluid. MRA HEAD FINDINGS Dolichoectatic but widely patent LEFT internal carotid artery. Mild non stenotic atheromatous change inferior cavernous segment RIGHT internal carotid artery otherwise widely patent vessel. Basilar artery minimally irregular in its proximal segment, with both vertebrals contributing, RIGHT dominant. Mild non stenotic atheromatous change above stools. Dolichoectatic, slightly irregular, but non stenotic M1 MCA segments. BILATERAL M2 and M3 vessel irregularity, consistent with intracranial atherosclerotic disease. 75% stenosis A1 ACA on the LEFT. Mild non stenotic irregularity of the RIGHT A1 ACA both distal anterior cerebral arteries  are patent. No proximal posterior cerebral artery flow limiting stenosis. 75% stenosis of the proximal P2 segment on the LEFT. Mild irregularity of the LEFT greater than RIGHT superior cerebellar arteries. Anterior inferior cerebellar arteries poorly visualized. RIGHT posterior inferior cerebellar artery robust ; LEFT is moderately diseased. IMPRESSION: Acute RIGHT MCA territory lenticulostriate infarct, nonhemorrhagic. Atrophy and small vessel disease. Scattered foci of chronic hemorrhagic and nonhemorrhagic lacunar infarcts. Incompletely evaluated LEFT ethmoid air cell opacity, possible developing mucocele. No proximal large vessel occlusion or flow-limiting stenosis. Intracranial atherosclerotic change most notable in the LEFT A1 ACA and LEFT P2 PCA, as well as BILATERAL MCA M2 and M3 vessels. Electronically Signed   By: Staci Righter M.D.   On: 10/23/2015 19:24   Mr Jodene Nam Head/brain Wo Cm  10/23/2015  CLINICAL DATA:  Dysarthria which began earlier today. Associated lightheadedness and gait instability. EXAM: MRI HEAD WITHOUT CONTRAST MRA HEAD WITHOUT CONTRAST TECHNIQUE: Multiplanar, multiecho pulse sequences of the brain and surrounding structures were obtained without intravenous contrast. Angiographic images of the head were obtained using MRA technique without contrast. COMPARISON:  CT head earlier today. MRI head 01/23/2015. MRI dated 11/16/2011. FINDINGS: MRI HEAD FINDINGS There is acute, RIGHT MCA territory lenticulostriate infarct affecting the lateral lentiform nucleus, and periventricular white matter. See for instance image 33 series 3, and image 20 series 9. No associated hemorrhage. No mass lesion, or extra-axial fluid. Global atrophy. Hydrocephalus ex vacuo. Extensive chronic microvascular ischemic change. Multiple hemorrhagic and nonhemorrhagic chronic lacunar infarcts, with scattered areas of susceptibility most notable in the RIGHT posterior lentiform nucleus, LEFT lateral lentiform nucleus, and  BILATERAL subcortical white matter. Ovoid focus susceptibility LEFT frontal lobe superiorly, with encephalomalacia, can be seen as far back as 2013, potentially an area of old trauma. No midline abnormality. RIGHT cataract extraction appears uncomplicated. Possible mucocele  of the LEFT ethmoid air cells, incompletely evaluated. No middle ear or mastoid fluid. MRA HEAD FINDINGS Dolichoectatic but widely patent LEFT internal carotid artery. Mild non stenotic atheromatous change inferior cavernous segment RIGHT internal carotid artery otherwise widely patent vessel. Basilar artery minimally irregular in its proximal segment, with both vertebrals contributing, RIGHT dominant. Mild non stenotic atheromatous change above stools. Dolichoectatic, slightly irregular, but non stenotic M1 MCA segments. BILATERAL M2 and M3 vessel irregularity, consistent with intracranial atherosclerotic disease. 75% stenosis A1 ACA on the LEFT. Mild non stenotic irregularity of the RIGHT A1 ACA both distal anterior cerebral arteries are patent. No proximal posterior cerebral artery flow limiting stenosis. 75% stenosis of the proximal P2 segment on the LEFT. Mild irregularity of the LEFT greater than RIGHT superior cerebellar arteries. Anterior inferior cerebellar arteries poorly visualized. RIGHT posterior inferior cerebellar artery robust ; LEFT is moderately diseased. IMPRESSION: Acute RIGHT MCA territory lenticulostriate infarct, nonhemorrhagic. Atrophy and small vessel disease. Scattered foci of chronic hemorrhagic and nonhemorrhagic lacunar infarcts. Incompletely evaluated LEFT ethmoid air cell opacity, possible developing mucocele. No proximal large vessel occlusion or flow-limiting stenosis. Intracranial atherosclerotic change most notable in the LEFT A1 ACA and LEFT P2 PCA, as well as BILATERAL MCA M2 and M3 vessels. Electronically Signed   By: Staci Righter M.D.   On: 10/23/2015 19:24    Assessment/Plan 1. Dysphagia due to recent  stroke Worsening dysphagia, remains NPO with all nutrition via PEG. Working with [place] at this time   2. Essential hypertension Stable on current regimen, Blood pressure goal 130-150/80  3. Hyperlipidemia LDL goal <70, at 83 now on lipitor. Will cont to monitor  4. Late effects of CVA (cerebrovascular accident) Worsening dysphagia and apaghia. Working with [place], aggressive PT/OT/ST recommended by neuro.  conts on ASA and Lipitor for prevention   5. Type 2 diabetes mellitus with other circulatory complication, without long-term current use of insulin (HCC) A1c at 6.1, goal <7  6. Gout without tophus, unspecified cause, unspecified chronicity, unspecified site Without recent flare, remains on allopurinol    Shaquelle Hernon K. Harle Battiest  [facility] [phone] 8 am - 5 pm) [phone] (after hours)

## 2016-01-13 ENCOUNTER — Ambulatory Visit (HOSPITAL_COMMUNITY)
Admission: RE | Admit: 2016-01-13 | Discharge: 2016-01-13 | Disposition: A | Discharge status: Home / Self Care | Payer: PRIVATE HEALTH INSURANCE | Source: Ambulatory Visit | Attending: Vascular Surgery | Admitting: Vascular Surgery

## 2016-01-13 ENCOUNTER — Ambulatory Visit (INDEPENDENT_AMBULATORY_CARE_PROVIDER_SITE_OTHER): Payer: Non-veteran care | Admitting: Vascular Surgery

## 2016-01-13 ENCOUNTER — Encounter: Payer: Self-pay | Admitting: Vascular Surgery

## 2016-01-13 VITALS — BP 112/69 | HR 52 | Temp 98.2°F | Resp 20 | Ht 69.0 in | Wt 156.0 lb

## 2016-01-13 DIAGNOSIS — I6523 Occlusion and stenosis of bilateral carotid arteries: Secondary | ICD-10-CM | POA: Diagnosis not present

## 2016-01-13 LAB — VAS US CAROTID
LCCADDIAS: 8 cm/s
LCCADSYS: 62 cm/s
LCCAPDIAS: 9 cm/s
LCCAPSYS: 67 cm/s
LEFT ECA DIAS: -6 cm/s
LEFT VERTEBRAL DIAS: 12 cm/s
LICADDIAS: -25 cm/s
LICAPSYS: -54 cm/s
Left ICA dist sys: -87 cm/s
Left ICA prox dias: -16 cm/s
RCCAPSYS: 53 cm/s
RIGHT CCA MID DIAS: 11 cm/s
RIGHT ECA DIAS: -5 cm/s
RIGHT VERTEBRAL DIAS: 15 cm/s
Right CCA prox dias: 11 cm/s
Right cca dist sys: -85 cm/s

## 2016-01-13 NOTE — Progress Notes (Signed)
Patient name: Sean Collins             MRN: [id number]                  DOB: 1951/02/07                    Sex: male       HPI: Sean Collins is a 65 y.o. male,  He returns for a follow up visit s/p CVA with mod carotid stenosis.  This was in June 2017.   he remains with a complete left hemiplegia. He is now no longer able to speak. He currently resides in a skilled nursing facility at [facility]. He is on a statin and aspirin.   The patient states that he has had a stroke in the past but was unable to describe his symptoms. Per chart review, the patient experienced similar symptoms during his previous stroke.      The patient has a past medical history of hypertension, diabetes GI bleed and hyperlipidemia.  These are currently stable. He is a current smoker.        Past Medical History:  Diagnosis Date  . Diabetes mellitus without complication ([place])    . Dysarthria    . Dysphagia    . GI bleed    . Hyperlipidemia    . Hypertension    . Stroke [facility])           Past Surgical History:  Procedure Laterality Date  . ESOPHAGOGASTRODUODENOSCOPY (EGD) WITH PROPOFOL N/A 11/03/2015    Procedure: ESOPHAGOGASTRODUODENOSCOPY (EGD) WITH PROPOFOL;  Surgeon: Manus Gunning, MD;  Location: [place];  Service: Gastroenterology;  Laterality: N/A;           Family History  Problem Relation Age of Onset  . Diabetes Brother        SOCIAL HISTORY: Social History         Social History  . Marital status: Legally Separated      Spouse name: N/A  . Number of children: N/A  . Years of education: N/A       Occupational History  . Not on file.          Social History Main Topics  . Smoking status: Current Every Day Smoker      Packs/day: 0.50      Years: 32.00      Types: Cigarettes  . Smokeless tobacco: Not on file        Comment: Started at age 21 though quit for 10 years at one point  . Alcohol use No  . Drug use: No  . Sexual activity: Not on  file        Other Topics Concern  . 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 acetaminophen (TYLENOL) 650 MG CR tablet Take 650 mg by mouth every 4 (four) hours as needed for pain or fever.    Marland Kitchen allopurinol (ZYLOPRIM) 300 MG tablet Take 300 mg by mouth daily.    Marland Kitchen amLODipine (NORVASC) 10 MG tablet Take 1 tablet (10 mg  total) by mouth daily. 30 tablet 2  . aspirin EC 325 MG EC tablet Take 1 tablet (325 mg total) by mouth daily. 30 tablet 0  . atenolol (TENORMIN) 50 MG tablet Take 1 tablet (50 mg total) by mouth 2 (two) times daily. 60 tablet 2  . atorvastatin (LIPITOR) 40 MG tablet Take 1 tablet (40 mg total) by mouth daily at 6 PM. 30 tablet 0  . carboxymethylcellulose (REFRESH PLUS) 0.5 % SOLN Place 1 drop into both eyes 3 (three) times daily as needed (dry eyes).    . cholecalciferol (VITAMIN D) 1000 UNITS tablet Take 1,000 Units by mouth daily.    . enalapril (VASOTEC) 20 MG tablet Take 1 tablet (20 mg total) by mouth daily. 30 tablet 2  . folic acid (FOLVITE) 1 MG tablet Take 1 mg by mouth daily.    . Nutritional Supplements (ISOSOURCE 1.5 CAL) LIQD Give 2 cans three times daily bolus via PEG tub with 30 ml flushes before and after feeding and medications. This will provide 2250 Kcal per day.    . pantoprazole sodium (PROTONIX) 40 mg/20 mL PACK Place 20 mLs (40 mg total) into feeding tube 2 (two) times daily. 30 each 3  . UNABLE TO FIND Med Name: Magic Cup by mouth three times daily     No current facility-administered medications on file prior to visit.      ROS:    General:  No weight loss, Fever, chills   HEENT: No recent headaches, no nasal bleeding, no visual changes, no sore throat   Neurologic: No dizziness,  blackouts, seizures. positive  symptoms of stroke. No recent episodes of slurred speech, or temporary blindness.   Cardiac: No recent episodes of chest pain/pressure, no shortness of breath at rest.  No shortness of breath with exertion.  Denies history of atrial fibrillation or irregular heartbeat   Vascular: No history of rest pain in feet.  No history of claudication.  No history of non-healing ulcer, No history of DVT    Pulmonary: No home oxygen, no productive cough, no hemoptysis,  No asthma or wheezing   Musculoskeletal:  [ ]  Arthritis, [ ]  Low back pain,  [ ]  Joint pain   Hematologic:No history of hypercoagulable state.  No history of easy bleeding.  No history of anemia   Gastrointestinal: No hematochezia or melena,  No gastroesophageal reflux, no trouble swallowing   Urinary: [ ]  chronic Kidney disease, [ ]  on HD - [ ]  MWF or [ ]  TTHS, [ ]  Burning with urination, [ ]  Frequent urination, [ ]  Difficulty urinating;    Skin: No rashes   Psychological: No history of anxiety,  No history of depression     Physical Examination    Vitals:   01/13/16 1206 01/13/16 1207  BP: 97/64 112/69  Pulse: (!) 52   Resp: 20   Temp: 98.2 F (36.8 C)   TempSrc: Oral   SpO2: 100%   Weight: 156 lb (70.8 kg)   Height: 5\' 9"  (1.753 m)      General:  Alert  HEENT: Normal Neck: No bruit or JVD Neuro: Unable to move his left upper or lower extremity, a phasic                       DATA:  Bilateral carotid duplex performed today. I reviewed and interpreted this study. Less than 40% stenosis bilaterally.     ASSESSMENT:   Less than 40% internal carotid artery stenosis  bilaterally    PLAN:   Overall patient remains very debilitated from his prior stroke. The patient will follow-up in one year. If he remains less than 40% stenosis at that time,  he most likely will not require further follow-up    Ruta Hinds, MD [facility]:  [phone] Pager: [phone]

## 2016-02-07 ENCOUNTER — Encounter: Payer: Self-pay | Admitting: Internal Medicine

## 2016-02-07 ENCOUNTER — Encounter: Payer: PRIVATE HEALTH INSURANCE | Attending: Physical Medicine & Rehabilitation

## 2016-02-07 ENCOUNTER — Encounter: Payer: Self-pay | Admitting: Physical Medicine & Rehabilitation

## 2016-02-07 ENCOUNTER — Ambulatory Visit (HOSPITAL_BASED_OUTPATIENT_CLINIC_OR_DEPARTMENT_OTHER): Payer: Medicare Other | Admitting: Physical Medicine & Rehabilitation

## 2016-02-07 VITALS — BP 106/53 | HR 57

## 2016-02-07 DIAGNOSIS — M7502 Adhesive capsulitis of left shoulder: Secondary | ICD-10-CM | POA: Diagnosis not present

## 2016-02-07 DIAGNOSIS — Z8673 Personal history of transient ischemic attack (TIA), and cerebral infarction without residual deficits: Secondary | ICD-10-CM | POA: Diagnosis not present

## 2016-02-07 DIAGNOSIS — M25512 Pain in left shoulder: Secondary | ICD-10-CM | POA: Diagnosis not present

## 2016-02-07 NOTE — Patient Instructions (Signed)
Diagnosis is frozen shoulder. Have done an injection today and will reassess in 2 weeks

## 2016-02-07 NOTE — Progress Notes (Signed)
Subjective:    Patient ID: Sean Collins, male    DOB: [birth year], 65 y.o.   MRN: [id number]  HPI Right basal ganglia infarct with Left hemiparesis which progressed to hemiplegia and NPO status.  Has a G tube , lives in [place].  Severe dysarthria communicates by writing.  His brother is with him today. Primary attending at SNF documented falls,  Pt not clear whether he fell on his shoulder recently .  Writing messages about injuring his shoulder, hip and knee as a teenager Per brother, pt has some pain with dressing He has completed PT and OT. Has recently followed up with neurology. Hx of gout Currently on plavix and ASA   Pain Inventory Average Pain 5 Pain Right Now 0 My pain is no pain  In the last 24 hours, has pain interfered with the following? General activity left shoulder pain Relation with others 10 Enjoyment of life 10 What TIME of day is your pain at its worst? daytime Sleep (in general) Good  Pain is worse with: walking Pain improves with: rest Relief from Meds: 7  Mobility ability to climb steps?  no do you drive?  no use a wheelchair Do you have any goals in this area?  yes  Function not employed: date last employed 10-25-15 Do you have any goals in this area?  yes  Neuro/Psych trouble walking  Prior Studies Any changes since last visit?  no  Physicians involved in your care Any changes since last visit?  no   Family History  Problem Relation Age of Onset  . Stroke Brother   . Diabetes Brother   . Stroke Brother   . Stroke Maternal Aunt    Social History   Social History  . Marital status: Legally Separated    Spouse name: N/A  . Number of children: N/A  . Years of education: N/A   Social History Main Topics  . Smoking status: Former Smoker    Packs/day: 0.50    Years: 32.00    Types: Cigarettes  . Smokeless tobacco: Never Used     Comment: Started at age 82 though quit for 10 years at one point  . Alcohol use No  . Drug  use: No  . Sexual activity: Not Currently   Other Topics Concern  . 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 ESOPHAGOGASTRODUODENOSCOPY (EGD) WITH PROPOFOL N/A 11/03/2015   Procedure: ESOPHAGOGASTRODUODENOSCOPY (EGD) WITH PROPOFOL;  Surgeon: Manus Gunning, MD;  Location: [facility];  Service: Gastroenterology;  Laterality: N/A;   Past Medical History:  Diagnosis Date  . Diabetes mellitus without complication ([place])   . Dysarthria   . Dysphagia   . GI bleed   . Hyperlipidemia   . Hypertension   . Stroke ([place])    BP (!) 106/53   Pulse (!) 57   SpO2 97%   Opioid Risk Score:   Fall Risk Score:  `1  Depression screen PHQ 2/9  Depression screen PHQ 2/9 02/07/2016  Decreased Interest 0  Down, Depressed, Hopeless 0  PHQ - 2 Score 0  Altered sleeping 0  Tired, decreased energy 0  Change in appetite 0  Feeling bad or failure about yourself  0  Trouble concentrating 0  Moving slowly or fidgety/restless  3  Suicidal thoughts 0  PHQ-9 Score 3    Review of Systems  HENT: Negative.   Eyes: Negative.   Respiratory: Negative.   Cardiovascular: Negative.   Gastrointestinal: Negative.   Endocrine: Negative.   Musculoskeletal: Negative.   Skin: Negative.   Allergic/Immunologic: Negative.   Neurological: Positive for weakness.  Hematological: Negative.   Psychiatric/Behavioral: Negative.        Objective:   Physical Exam  Constitutional: He appears well-developed and well-nourished.  HENT:  Head: Normocephalic and atraumatic.  Eyes: Conjunctivae and EOM are normal. Pupils are equal, round, and reactive to light.  Neck: Normal range of motion.  Cardiovascular: Normal rate, regular rhythm and normal heart sounds.   Pulmonary/Chest: Effort normal and breath sounds normal. No  respiratory distress. He has no wheezes.  Abdominal: Soft. Bowel sounds are normal. He exhibits no distension.  Feeding tube. Left upper quadrant. No evidence of drainage  Musculoskeletal:  There is no tenderness to palpation around the acromial area, the bicipital groove or the before meals joint. There is some wasting of the periscapular area on the left side only. There is limited range of motion in flexion, extension, external rotation in the left shoulder. No evidence of hypersensitivity to light touch in the left upper extremity. No evidence of swelling in the hand or wrist area. No evidence of erythema in the left upper extremity. No evidence of sweating or excessive dryness. No nailbed changes. There is pain with external rotation. Left shoulder positive impingement signs  Neurological: He is alert. He exhibits abnormal muscle tone. Coordination and gait abnormal.  Motor strength is 5/5 in the right deltoid, biceps, triceps, grip, hip flexor, knee extensor, ankle dorsiflexor and plantar flexor. 0, left deltoid, biceps, triceps, grip, 3 minus and left hip flexor 4 minus at the left knee extensor trace ankle dorsiflexor, plantar flexor. Sensation difficult to assess secondary to communication problems. Orientation difficult to assess secondary to communication issues. Severe dysarthria Ashworth grade 3 spasticity in the left pectoralis. Ashworth grade 2 at the finger flexors and wrist flexors on the left side.  Nursing note and vitals reviewed.  1 fingerbreadth inferior and anterior subluxation of the left shoulder No evidence of trauma to the left shoulder girdle area      Assessment & Plan:  1. Post stroke shoulder pain, left shoulder, this is likely multifactorial. Certainly has evidence of adhesive capsulitis. He has severe weakness in the left shoulder girdle, as well as increased tone in the left pectoralis. Recommend left glenohumeral injection today. Follow-up in 2 weeks and may  reinject it still showing signs of pain with minimal range of motion. If the injection was not helpful at all. We will check imaging studies. Consider ultrasound-guided left shoulder injection.  May need to trial gabapentin for neuropathic pain

## 2016-02-09 ENCOUNTER — Encounter: Payer: Self-pay | Admitting: Nurse Practitioner

## 2016-02-09 ENCOUNTER — Non-Acute Institutional Stay (SKILLED_NURSING_FACILITY): Payer: Medicare Other | Admitting: Nurse Practitioner

## 2016-02-09 DIAGNOSIS — F329 Major depressive disorder, single episode, unspecified: Secondary | ICD-10-CM

## 2016-02-09 DIAGNOSIS — I6990 Unspecified sequelae of unspecified cerebrovascular disease: Secondary | ICD-10-CM | POA: Diagnosis not present

## 2016-02-09 DIAGNOSIS — F32A Depression, unspecified: Secondary | ICD-10-CM

## 2016-02-09 DIAGNOSIS — I69391 Dysphagia following cerebral infarction: Secondary | ICD-10-CM

## 2016-02-09 DIAGNOSIS — G8194 Hemiplegia, unspecified affecting left nondominant side: Secondary | ICD-10-CM | POA: Diagnosis not present

## 2016-02-09 DIAGNOSIS — E782 Mixed hyperlipidemia: Secondary | ICD-10-CM

## 2016-02-09 DIAGNOSIS — R7303 Prediabetes: Secondary | ICD-10-CM | POA: Diagnosis not present

## 2016-02-09 MED ORDER — DULOXETINE HCL 30 MG PO CPEP: 30.0000 mg | ORAL_CAPSULE | Freq: Every day | ORAL | 3 refills | Status: DC

## 2016-02-09 NOTE — Progress Notes (Signed)
Nursing Home Location:  [facility]  Place of Service: SNF (31)  PCP: Unice Cobble, MD  No Known Allergies  Chief Complaint  Patient presents with  . Medical Management of Chronic Issues    Routine Visit    HPI:  Patient is a 65 y.o. male seen today at [facility] to follow up on chronic conditions. Pt with hx of CVA, arthrosclerosis, CAD, htn, DM, hyperlipidemia. Pt with multiple stroke history due to atherosclerosis. Had recent right MCA infarct and now with dysarthria and dysphagia and left sided weakness. HPI and ROS limited due to aphagia.  Pt with ongoing falls because he attempts to get oob without assistance. No injuries noted.  Pt is NPO and receives nutrition via peg, staff reports he will drink occasionally.  Staff reports depression. Pt with aphagia so unable to give much information in regards to this but does nod his head yes when asked about low mood and depression.  Review of Systems:  Review of Systems  Unable to perform ROS: Other    Past Medical History:  Diagnosis Date  . Diabetes mellitus without complication ([place])   . Dysarthria   . Dysphagia   . GI bleed   . Hyperlipidemia   . Hypertension   . Stroke [facility])    Past Surgical History:  Procedure Laterality Date  . ESOPHAGOGASTRODUODENOSCOPY (EGD) WITH PROPOFOL N/A 11/03/2015   Procedure: ESOPHAGOGASTRODUODENOSCOPY (EGD) WITH PROPOFOL;  Surgeon: Manus Gunning, MD;  Location: [facility];  Service: Gastroenterology;  Laterality: N/A;   Social History:   reports that he has quit smoking. His smoking use included Cigarettes. He has a 16.00 pack-year smoking history. He has never used smokeless tobacco. He reports that he does not drink alcohol or use drugs.  Family History  Problem Relation Age of Onset  . Stroke Brother   . Diabetes Brother   . Stroke Brother   . Stroke Maternal Aunt     Medications: Patient's Medications  New Prescriptions   No medications on file    Previous Medications   ACETAMINOPHEN (TYLENOL) 650 MG CR TABLET    Take 650 mg by mouth every 4 (four) hours as needed for pain or fever.   ALLOPURINOL (ZYLOPRIM) 300 MG TABLET    Take 300 mg by mouth daily.   AMLODIPINE (NORVASC) 10 MG TABLET    Take 1 tablet (10 mg total) by mouth daily.   ASPIRIN EC 325 MG EC TABLET    Take 1 tablet (325 mg total) by mouth daily.   ATENOLOL (TENORMIN) 50 MG TABLET    Take 1 tablet (50 mg total) by mouth 2 (two) times daily.   ATORVASTATIN (LIPITOR) 40 MG TABLET    Take 1 tablet (40 mg total) by mouth daily at 6 PM.   CARBOXYMETHYLCELLULOSE (REFRESH PLUS) 0.5 % SOLN    Place 1 drop into both eyes 3 (three) times daily as needed (dry eyes).   CHOLECALCIFEROL (VITAMIN D) 1000 UNITS TABLET    Take 1,000 Units by mouth daily.   ENALAPRIL (VASOTEC) 20 MG TABLET    Take 1 tablet (20 mg total) by mouth daily.   FOLIC ACID (FOLVITE) 1 MG TABLET    Take 1 mg by mouth daily.   NUTRITIONAL SUPPLEMENTS (ISOSOURCE 1.5 CAL) LIQD    Give 2 cans three times daily bolus via PEG tub with 30 ml flushes before and after feeding and medications. This will provide 2250 Kcal per day.   PANTOPRAZOLE SODIUM (PROTONIX)  40 MG/20 ML PACK    Place 20 mLs (40 mg total) into feeding tube 2 (two) times daily.   UNABLE TO FIND    Med Name: Magic Cup by mouth three times daily  Modified Medications   No medications on file  Discontinued Medications   No medications on file     Physical Exam: Vitals:   02/09/16 1149  BP: 132/76  Pulse: 74  Resp: 19  Temp: 98 F (36.7 C)  Weight: 152 lb 6.4 oz (69.1 kg)  Height: 5\' 9"  (1.753 m)    Physical Exam  Constitutional: He appears well-developed and well-nourished. No distress.  HENT:  Head: Normocephalic and atraumatic.  Mouth/Throat: Oropharynx is clear and moist. No oropharyngeal exudate.  Left sided facial droop  Eyes: Conjunctivae and EOM are normal. Pupils are equal, round, and reactive to light.  Neck: Normal range of motion.  Neck supple.  Cardiovascular: Normal rate, regular rhythm and normal heart sounds.   Pulmonary/Chest: Effort normal and breath sounds normal.  Abdominal: Soft. Bowel sounds are normal.  PEG tube intact   Musculoskeletal: He exhibits no edema or tenderness.  Neurological: He is alert.  Left sided hemiplegia    Skin: Skin is warm and dry. He is not diaphoretic.  Psychiatric: He has a normal mood and affect.    Labs reviewed: Basic Metabolic Panel:  Recent Labs  11/04/15 0416 11/05/15 0614 11/06/15 0515 11/07/15 0520 11/12/15 12/10/15 12/30/15  NA 144 144 145 144 139 141 140  K 4.0 3.7 3.7 4.3 4.2 4.5 4.0  CL 115* 112* 113* 112*  --   --   --   CO2 22 24 24 27   --   --   --   GLUCOSE 99 101* 128* 143*  --   --   --   BUN 42* 28* 26* 24* 29* 40* 22*  CREATININE 1.45* 1.26* 1.35* 1.16 1.3 1.3 1.2  CALCIUM 8.6* 9.0 9.0 7.4*  --   --   --   MG 2.0 2.0 2.2  --   --   --   --   PHOS 3.8 3.7 3.8  --   --   --   --    Liver Function Tests:  Recent Labs  10/23/15 1442 11/04/15 0416 11/05/15 0614 11/06/15 0515 12/30/15  AST 23  --   --   --  16  ALT 17  --   --   --  16  ALKPHOS 71  --   --   --  81  BILITOT 0.4  --   --   --   --   PROT 7.1  --   --   --   --   ALBUMIN 3.4* 2.2* 2.5* 2.4*  --    No results for input(s): LIPASE, AMYLASE in the last 8760 hours. No results for input(s): AMMONIA in the last 8760 hours. CBC:  Recent Labs  11/04/15 0416  11/05/15 0614  11/06/15 0515 11/07/15 0520 11/08/15 0526 11/12/15 12/10/15 12/30/15  WBC 12.5*  < > 15.8*  --  15.7* 12.5* 12.9* 13.3 7.9 8.3  NEUTROABS 8.7*  --  12.1*  --  12.4*  --   --   --   --   --   HGB 6.5*  < > 9.4*  < > 9.3* 8.7* 9.0* 9.9* 10.9* 11.3*  HCT 20.3*  < > 28.6*  < > 28.4* 26.5* 28.5* 31* 34* 35*  MCV 83.5  < > 82.7  --  85.3 84.7 85.1  --   --   --   PLT 267  < > 303  --  322 372 424* 544* 282 289  < > = values in this interval not displayed. TSH: No results for input(s): TSH in the last 8760  hours. A1C: Lab Results  Component Value Date   HGBA1C 6.1 (H) 10/23/2015   Lipid Panel:  Recent Labs  10/24/15 0502  CHOL 138  HDL 43  LDLCALC 83  TRIG 59  CHOLHDL 3.2     Assessment/Plan 1. Left hemiparesis ([place]) Reports occasional pain to left side, working with therapy which has been beneficial   2. Pre-diabetes Follow up A1c   3. Dysphagia due to recent stroke NPO, all nutrition through tube, staff reports pt drinking at times, cough and congestion come and go per staff, will get chest xray rule out aspiration   4. Late effects of CVA (cerebrovascular accident) conts to work with PT/OT/ST, multiple falls because he attempts to get up without assist. Pt with left sided hemiparesis.   5. Depression, unspecified depression type -has been placed on psych list to see however has been seen yet, will start Cymbalta at this time.  - DULoxetine (CYMBALTA) 30 MG capsule; Take 1 capsule (30 mg total) by mouth daily  6. Mixed hyperlipidemia On lipitor, will follow up fasting lipids.    Carlos American. Harle Battiest  [facility] [phone] 8 am - 5 pm) [phone] (after hours)

## 2016-02-11 LAB — LIPID PANEL
Cholesterol: 158 mg/dL (ref 0–200)
HDL: 46 mg/dL (ref 35–70)
LDL CALC: 96 mg/dL
Triglycerides: 80 mg/dL (ref 40–160)

## 2016-02-11 LAB — HEMOGLOBIN A1C: HEMOGLOBIN A1C: 6.3

## 2016-02-17 LAB — HM DIABETES EYE EXAM

## 2016-02-21 ENCOUNTER — Encounter: Payer: Self-pay | Admitting: Physical Medicine & Rehabilitation

## 2016-02-21 ENCOUNTER — Ambulatory Visit (HOSPITAL_BASED_OUTPATIENT_CLINIC_OR_DEPARTMENT_OTHER): Payer: Medicare Other | Admitting: Physical Medicine & Rehabilitation

## 2016-02-21 VITALS — BP 120/66 | HR 62 | Resp 14

## 2016-02-21 DIAGNOSIS — M25512 Pain in left shoulder: Secondary | ICD-10-CM | POA: Diagnosis not present

## 2016-02-21 DIAGNOSIS — M7502 Adhesive capsulitis of left shoulder: Secondary | ICD-10-CM

## 2016-02-21 DIAGNOSIS — Z8673 Personal history of transient ischemic attack (TIA), and cerebral infarction without residual deficits: Secondary | ICD-10-CM | POA: Diagnosis not present

## 2016-02-21 NOTE — Progress Notes (Signed)
Shoulder injection Left Glenohumeral    Indication:Left Shoulder pain not relieved by medication management and other conservative care.  Informed consent was obtained after describing risks and benefits of the procedure with the patient, this includes bleeding, bruising, infection and medication side effects. The patient wishes to proceed and has given written consent. Patient was placed in a seated position. The Left shoulder was marked and prepped with betadine in the subacromial area. A 25-gauge 1-1/2 inch needle was inserted into the subacromial area. After negative draw back for blood, a solution containing 1 mL of 6 mg per ML betamethasone and 4 mL of 1% lidocaine was injected. A band aid was applied. The patient tolerated the procedure well. Post procedure instructions were given. 

## 2016-02-21 NOTE — Progress Notes (Signed)
Subjective:    Patient ID: Sean Collins, male    DOB: 1950/05/19, 65 y.o.   MRN: [id number]  HPI   Pain Inventory Average Pain 5 Pain Right Now 5 My pain is sharp  In the last 24 hours, has pain interfered with the following? General activity 5 Relation with others 5 Enjoyment of life 5 What TIME of day is your pain at its worst? daytime Sleep (in general) Good  Pain is worse with: some activites Pain improves with: therapy/exercise Relief from Meds: no selection  Mobility ability to climb steps?  no do you drive?  no use a wheelchair Do you have any goals in this area?  yes  Function disabled: date disabled . I need assistance with the following:  feeding, dressing, bathing and toileting  Neuro/Psych trouble walking  Prior Studies Any changes since last visit?  no  Physicians involved in your care Any changes since last visit?  no   Family History  Problem Relation Age of Onset  . Stroke Brother   . Diabetes Brother   . Stroke Brother   . Stroke Maternal Aunt    Social History   Social History  . Marital status: Legally Separated    Spouse name: N/A  . Number of children: N/A  . Years of education: N/A   Social History Main Topics  . Smoking status: Former Smoker    Packs/day: 0.50    Years: 32.00    Types: Cigarettes  . Smokeless tobacco: Never Used     Comment: Started at age 89 though quit for 10 years at one point  . Alcohol use No  . Drug use: No  . Sexual activity: Not Currently   Other Topics Concern  . 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 ESOPHAGOGASTRODUODENOSCOPY (EGD) WITH PROPOFOL N/A 11/03/2015   Procedure: ESOPHAGOGASTRODUODENOSCOPY (EGD) WITH PROPOFOL;  Surgeon: Manus Gunning, MD;  Location: [facility];  Service:  Gastroenterology;  Laterality: N/A;   Past Medical History:  Diagnosis Date  . Diabetes mellitus without complication ([place])   . Dysarthria   . Dysphagia   . GI bleed   . Hyperlipidemia   . Hypertension   . Stroke ([place])    BP 120/66 (BP Location: Right Arm, Patient Position: Sitting, Cuff Size: Large)   Pulse 62   Resp 14   SpO2 98%   Opioid Risk Score:   Fall Risk Score:  `1  Depression screen PHQ 2/9  Depression screen PHQ 2/9 02/07/2016  Decreased Interest 0  Down, Depressed, Hopeless 0  PHQ - 2 Score 0  Altered sleeping 0  Tired, decreased energy 0  Change in appetite 0  Feeling bad or failure about yourself  0  Trouble concentrating 0  Moving slowly or fidgety/restless 3  Suicidal thoughts 0  PHQ-9 Score 3    Review of Systems  Constitutional: Negative.   HENT: Negative.   Eyes: Negative.   Respiratory: Positive for cough.   Cardiovascular: Negative.   Gastrointestinal: Negative.   Endocrine: Negative.   Genitourinary: Negative.   Musculoskeletal: Positive for arthralgias and gait problem.  Skin: Negative.   Allergic/Immunologic: Negative.   Hematological: Negative.   Psychiatric/Behavioral: Negative.        Objective:   Physical Exam  Assessment & Plan:

## 2016-02-21 NOTE — Patient Instructions (Signed)
Will need to repeat injection in 2-3 weeks. Under ultrasound guidance.

## 2016-02-23 DIAGNOSIS — Z961 Presence of intraocular lens: Secondary | ICD-10-CM | POA: Diagnosis not present

## 2016-02-23 DIAGNOSIS — H25812 Combined forms of age-related cataract, left eye: Secondary | ICD-10-CM | POA: Diagnosis not present

## 2016-02-23 DIAGNOSIS — H04123 Dry eye syndrome of bilateral lacrimal glands: Secondary | ICD-10-CM | POA: Diagnosis not present

## 2016-02-23 DIAGNOSIS — E119 Type 2 diabetes mellitus without complications: Secondary | ICD-10-CM | POA: Diagnosis not present

## 2016-03-01 ENCOUNTER — Other Ambulatory Visit (HOSPITAL_COMMUNITY): Payer: Self-pay | Admitting: Internal Medicine

## 2016-03-01 DIAGNOSIS — R1319 Other dysphagia: Secondary | ICD-10-CM

## 2016-03-03 ENCOUNTER — Ambulatory Visit (HOSPITAL_COMMUNITY)
Admission: RE | Admit: 2016-03-03 | Discharge: 2016-03-03 | Disposition: A | Discharge status: Home / Self Care | Payer: Medicare Other | Source: Ambulatory Visit | Attending: Internal Medicine | Admitting: Internal Medicine

## 2016-03-03 ENCOUNTER — Ambulatory Visit (HOSPITAL_COMMUNITY)
Admission: RE | Admit: 2016-03-03 | Discharge: 2016-03-03 | Disposition: A | Discharge status: Home / Self Care | Payer: Medicaid Other | Source: Ambulatory Visit | Attending: Internal Medicine | Admitting: Internal Medicine

## 2016-03-03 DIAGNOSIS — E785 Hyperlipidemia, unspecified: Secondary | ICD-10-CM | POA: Insufficient documentation

## 2016-03-03 DIAGNOSIS — Z8673 Personal history of transient ischemic attack (TIA), and cerebral infarction without residual deficits: Secondary | ICD-10-CM | POA: Diagnosis not present

## 2016-03-03 DIAGNOSIS — I10 Essential (primary) hypertension: Secondary | ICD-10-CM | POA: Insufficient documentation

## 2016-03-03 DIAGNOSIS — E119 Type 2 diabetes mellitus without complications: Secondary | ICD-10-CM | POA: Diagnosis not present

## 2016-03-03 DIAGNOSIS — R1310 Dysphagia, unspecified: Secondary | ICD-10-CM | POA: Insufficient documentation

## 2016-03-03 DIAGNOSIS — R633 Feeding difficulties: Secondary | ICD-10-CM | POA: Diagnosis not present

## 2016-03-03 DIAGNOSIS — R1319 Other dysphagia: Secondary | ICD-10-CM

## 2016-03-07 ENCOUNTER — Encounter: Payer: Self-pay | Admitting: Physical Medicine & Rehabilitation

## 2016-03-07 ENCOUNTER — Ambulatory Visit (HOSPITAL_BASED_OUTPATIENT_CLINIC_OR_DEPARTMENT_OTHER): Payer: Medicare Other | Admitting: Physical Medicine & Rehabilitation

## 2016-03-07 VITALS — BP 125/77 | HR 62 | Resp 14

## 2016-03-07 DIAGNOSIS — M7502 Adhesive capsulitis of left shoulder: Secondary | ICD-10-CM | POA: Diagnosis not present

## 2016-03-07 DIAGNOSIS — Z8673 Personal history of transient ischemic attack (TIA), and cerebral infarction without residual deficits: Secondary | ICD-10-CM | POA: Diagnosis not present

## 2016-03-07 DIAGNOSIS — M25512 Pain in left shoulder: Secondary | ICD-10-CM | POA: Diagnosis not present

## 2016-03-07 NOTE — Progress Notes (Signed)
Shoulder injection Left Glenohumeral With ultrasound guidance)  Indication:Left post stroke Shoulder pain not relieved by medication management and other conservative care. Limited ROM c/w frozen shoulder   Informed consent was obtained after describing risks and benefits of the procedure with the patient, this includes bleeding, bruising, infection and medication side effects. The patient wishes to proceed and has given written consent. Patient was placed in a seated position. The Left shoulder was marked and prepped with betadine in the subacromial area. A 25-gauge 1-1/2 inch needle was inserted into the subacromial area. After negative draw back for blood, a solution containing 1 mL of 6 mg per ML betamethasone and 4 mL of 1% lidocaine was injected. A band aid was applied. The patient tolerated the procedure well. Post procedure instructions were given.

## 2016-03-07 NOTE — Patient Instructions (Signed)
Please use ice if there is any swelling or bruising or pain in the left shoulder after the injection. May use 20 minutes every 2 hours as needed

## 2016-03-08 ENCOUNTER — Encounter: Payer: Self-pay | Admitting: Nurse Practitioner

## 2016-03-08 ENCOUNTER — Non-Acute Institutional Stay (SKILLED_NURSING_FACILITY): Payer: Medicare Other | Admitting: Nurse Practitioner

## 2016-03-08 DIAGNOSIS — E782 Mixed hyperlipidemia: Secondary | ICD-10-CM

## 2016-03-08 DIAGNOSIS — I69391 Dysphagia following cerebral infarction: Secondary | ICD-10-CM

## 2016-03-08 DIAGNOSIS — G8194 Hemiplegia, unspecified affecting left nondominant side: Secondary | ICD-10-CM

## 2016-03-08 DIAGNOSIS — I10 Essential (primary) hypertension: Secondary | ICD-10-CM | POA: Diagnosis not present

## 2016-03-08 DIAGNOSIS — E1121 Type 2 diabetes mellitus with diabetic nephropathy: Secondary | ICD-10-CM | POA: Diagnosis not present

## 2016-03-08 NOTE — Progress Notes (Signed)
Nursing Home Location:  [facility]  Place of Service: SNF (31)  PCP: Unice Cobble, MD  No Known Allergies  Chief Complaint  Patient presents with  . Medical Management of Chronic Issues    Routine Visit    HPI:  Patient is a 65 y.o. male seen today at [facility] to follow up on chronic conditions. Pt with hx of CVA, arthrosclerosis, CAD, htn, DM, hyperlipidemia. Pt with multiple stroke history due to atherosclerosis. Had recent right MCA infarct and now with dysarthria and dysphagia and left sided weakness. HPI and ROS limited due to aphagia.  Pt has followed with Dr Alysia Penna for adhesive capsulitis of left shoulder s/p injections. Last visit was yesterday. Pt conts to point to left shoulder. Staff reports there has not been much improvement in mood but no worsening of behaviors or depression noted.  Pt went for follow up Swallow eval and conts to be a severe aspiration risk with no improvement and possible worsening function since last MBS in august. Silent aspiration consistently occurred.  Review of Systems:  Review of Systems  Unable to perform ROS: Other    Past Medical History:  Diagnosis Date  . Diabetes mellitus without complication ([place])   . Dysarthria   . Dysphagia   . GI bleed   . Hyperlipidemia   . Hypertension   . Stroke [facility])    Past Surgical History:  Procedure Laterality Date  . ESOPHAGOGASTRODUODENOSCOPY (EGD) WITH PROPOFOL N/A 11/03/2015   Procedure: ESOPHAGOGASTRODUODENOSCOPY (EGD) WITH PROPOFOL;  Surgeon: Manus Gunning, MD;  Location: [place];  Service: Gastroenterology;  Laterality: N/A;   Social History:   reports that he has quit smoking. His smoking use included Cigarettes. He has a 16.00 pack-year smoking history. He has never used smokeless tobacco. He reports that he does not drink alcohol or use drugs.  Family History  Problem Relation Age of Onset  . Stroke Brother   . Diabetes Brother   . Stroke  Brother   . Stroke Maternal Aunt     Medications: Patient's Medications  New Prescriptions   No medications on file  Previous Medications   ACETAMINOPHEN (TYLENOL) 650 MG CR TABLET    Take 650 mg by mouth every 4 (four) hours as needed for pain or fever.   ALLOPURINOL (ZYLOPRIM) 300 MG TABLET    Take 300 mg by mouth daily.   AMLODIPINE (NORVASC) 10 MG TABLET    Take 1 tablet (10 mg total) by mouth daily.   ASPIRIN EC 325 MG EC TABLET    Take 1 tablet (325 mg total) by mouth daily.   ATENOLOL (TENORMIN) 50 MG TABLET    Take 1 tablet (50 mg total) by mouth 2 (two) times daily.   ATORVASTATIN (LIPITOR) 40 MG TABLET    Take 1 tablet (40 mg total) by mouth daily at 6 PM.   CARBOXYMETHYLCELLULOSE (REFRESH PLUS) 0.5 % SOLN    Place 1 drop into both eyes 3 (three) times daily as needed (dry eyes).   CHOLECALCIFEROL (VITAMIN D) 1000 UNITS TABLET    Take 1,000 Units by mouth daily.   ENALAPRIL (VASOTEC) 20 MG TABLET    Take 1 tablet (20 mg total) by mouth daily.   FOLIC ACID (FOLVITE) 1 MG TABLET    Take 1 mg by mouth daily.   NUTRITIONAL SUPPLEMENTS (ISOSOURCE 1.5 CAL) LIQD    Give 2 cans three times daily bolus via PEG tub with 30 ml flushes before and after  feeding and medications. This will provide 2250 Kcal per day.   PANTOPRAZOLE SODIUM (PROTONIX) 40 MG/20 ML PACK    Place 20 mLs (40 mg total) into feeding tube 2 (two) times daily.   UNABLE TO FIND    Med Name: Magic Cup by mouth three times daily   VENLAFAXINE (EFFEXOR) 37.5 MG TABLET    Place 1 tablet into feeding tube 2 (two) times daily.  Modified Medications   No medications on file  Discontinued Medications   DULOXETINE (CYMBALTA) 30 MG CAPSULE    Take 1 capsule (30 mg total) by mouth daily.     Physical Exam: Vitals:   03/08/16 1146  BP: 136/76  Pulse: 69  Resp: 20  Temp: 98.1 F (36.7 C)  Weight: 153 lb (69.4 kg)  Height: 5\' 9"  (1.753 m)    Physical Exam  Constitutional: He appears well-developed and well-nourished. No  distress.  HENT:  Head: Normocephalic and atraumatic.  Mouth/Throat: Oropharynx is clear and moist. No oropharyngeal exudate.  Left sided facial droop  Eyes: Conjunctivae and EOM are normal. Pupils are equal, round, and reactive to light.  Neck: Normal range of motion. Neck supple.  Cardiovascular: Normal rate, regular rhythm and normal heart sounds.   Pulmonary/Chest: Effort normal and breath sounds normal.  Abdominal: Soft. Bowel sounds are normal.  PEG tube intact   Musculoskeletal: He exhibits no edema or tenderness.  Neurological: He is alert.  Left sided hemiplegia    Skin: Skin is warm and dry. He is not diaphoretic.  Psychiatric: He has a normal mood and affect.    Labs reviewed: Basic Metabolic Panel:  Recent Labs  11/04/15 0416 11/05/15 0614 11/06/15 0515 11/07/15 0520 11/12/15 12/10/15 12/30/15  NA 144 144 145 144 139 141 140  K 4.0 3.7 3.7 4.3 4.2 4.5 4.0  CL 115* 112* 113* 112*  --   --   --   CO2 22 24 24 27   --   --   --   GLUCOSE 99 101* 128* 143*  --   --   --   BUN 42* 28* 26* 24* 29* 40* 22*  CREATININE 1.45* 1.26* 1.35* 1.16 1.3 1.3 1.2  CALCIUM 8.6* 9.0 9.0 7.4*  --   --   --   MG 2.0 2.0 2.2  --   --   --   --   PHOS 3.8 3.7 3.8  --   --   --   --    Liver Function Tests:  Recent Labs  10/23/15 1442 11/04/15 0416 11/05/15 0614 11/06/15 0515 12/30/15  AST 23  --   --   --  16  ALT 17  --   --   --  16  ALKPHOS 71  --   --   --  81  BILITOT 0.4  --   --   --   --   PROT 7.1  --   --   --   --   ALBUMIN 3.4* 2.2* 2.5* 2.4*  --    No results for input(s): LIPASE, AMYLASE in the last 8760 hours. No results for input(s): AMMONIA in the last 8760 hours. CBC:  Recent Labs  11/04/15 0416  11/05/15 0614  11/06/15 0515 11/07/15 0520 11/08/15 0526 11/12/15 12/10/15 12/30/15  WBC 12.5*  < > 15.8*  --  15.7* 12.5* 12.9* 13.3 7.9 8.3  NEUTROABS 8.7*  --  12.1*  --  12.4*  --   --   --   --   --  HGB 6.5*  < > 9.4*  < > 9.3* 8.7* 9.0* 9.9*  10.9* 11.3*  HCT 20.3*  < > 28.6*  < > 28.4* 26.5* 28.5* 31* 34* 35*  MCV 83.5  < > 82.7  --  85.3 84.7 85.1  --   --   --   PLT 267  < > 303  --  322 372 424* 544* 282 289  < > = values in this interval not displayed. TSH: No results for input(s): TSH in the last 8760 hours. A1C: Lab Results  Component Value Date   HGBA1C 6.3 02/11/2016   Lipid Panel:  Recent Labs  10/24/15 0502 02/11/16  CHOL 138 158  HDL 43 46  LDLCALC 83 96  TRIG 59 80  CHOLHDL 3.2  --      Assessment/Plan 1. Mixed hyperlipidemia Not at goal, currently on lipitor 40 mg will increase lipitor to 80 mg daily at this time.   2. Type 2 diabetes mellitus with diabetic nephropathy, without long-term current use of insulin (HCC) A1c at goal.   3. Dysphagia due to recent stroke Repeat swallow study without different findings, conts strict NPO status, all nutrition via PEG due to consistent silent aspiration.   4. Left hemiparesis (HCC) Currently being followed for post stroke shoulder pain with adhesive capsulitis with injection. Still complaining of pain and had another injection done yesterday. Has completed PT/OT. conts on ASA 325 mg daily   5. Essential hypertension Blood pressure stable on norvasc, atenolol, and vasotec   6. Depression Still with signs of low mood, will increase Effexor to 75 mg per tube BID  Burl Tauzin K. Harle Battiest  [facility] [phone] 8 am - 5 pm) [phone] (after hours)

## 2016-03-21 ENCOUNTER — Encounter: Payer: Medicare Other | Attending: Physical Medicine & Rehabilitation

## 2016-03-21 ENCOUNTER — Ambulatory Visit (HOSPITAL_BASED_OUTPATIENT_CLINIC_OR_DEPARTMENT_OTHER): Payer: Medicaid Other | Admitting: Physical Medicine & Rehabilitation

## 2016-03-21 ENCOUNTER — Encounter: Payer: Self-pay | Admitting: Physical Medicine & Rehabilitation

## 2016-03-21 VITALS — BP 108/75 | HR 63 | Resp 14

## 2016-03-21 DIAGNOSIS — M7502 Adhesive capsulitis of left shoulder: Secondary | ICD-10-CM

## 2016-03-21 DIAGNOSIS — Z8673 Personal history of transient ischemic attack (TIA), and cerebral infarction without residual deficits: Secondary | ICD-10-CM | POA: Insufficient documentation

## 2016-03-21 DIAGNOSIS — M25512 Pain in left shoulder: Secondary | ICD-10-CM | POA: Insufficient documentation

## 2016-03-21 NOTE — Progress Notes (Signed)
Shoulder injection Left Glenohumeral With ultrasound guidance)  Indication:Left post stroke Shoulder pain not relieved by medication management and other conservative care. Limited ROM c/w frozen shoulder   Informed consent was obtained after describing risks and benefits of the procedure with the patient, this includes bleeding, bruising, infection and medication side effects. The patient wishes to proceed and has given written consent. Patient was placed in a R lat decubitus position. The Left shoulder was marked and prepped with betadine in the subacromial area. A 25-gauge 1-1/2 inch needle was inserted into the subacromial area. After negative draw back for blood, a solution containing 1 mL of 6 mg per ML betamethasone and 4 mL of 1% lidocaine was injected. A band aid was applied. The patient tolerated the procedure well. Post procedure instructions were given.

## 2016-03-21 NOTE — Patient Instructions (Signed)
Return in 1 month for office visit. If there is still significant pain in the left shoulder and arm, may need to trial a new medication

## 2016-03-23 DIAGNOSIS — F4320 Adjustment disorder, unspecified: Secondary | ICD-10-CM | POA: Diagnosis not present

## 2016-03-23 DIAGNOSIS — F339 Major depressive disorder, recurrent, unspecified: Secondary | ICD-10-CM | POA: Diagnosis not present

## 2016-03-23 DIAGNOSIS — F419 Anxiety disorder, unspecified: Secondary | ICD-10-CM | POA: Diagnosis not present

## 2016-04-07 ENCOUNTER — Encounter: Payer: Self-pay | Admitting: Nurse Practitioner

## 2016-04-07 ENCOUNTER — Non-Acute Institutional Stay (SKILLED_NURSING_FACILITY): Payer: Medicare Other | Admitting: Nurse Practitioner

## 2016-04-07 DIAGNOSIS — I6990 Unspecified sequelae of unspecified cerebrovascular disease: Secondary | ICD-10-CM | POA: Diagnosis not present

## 2016-04-07 DIAGNOSIS — M109 Gout, unspecified: Secondary | ICD-10-CM | POA: Diagnosis not present

## 2016-04-07 DIAGNOSIS — I10 Essential (primary) hypertension: Secondary | ICD-10-CM | POA: Diagnosis not present

## 2016-04-07 DIAGNOSIS — E782 Mixed hyperlipidemia: Secondary | ICD-10-CM | POA: Diagnosis not present

## 2016-04-07 DIAGNOSIS — F329 Major depressive disorder, single episode, unspecified: Secondary | ICD-10-CM

## 2016-04-07 DIAGNOSIS — F32A Depression, unspecified: Secondary | ICD-10-CM

## 2016-04-07 NOTE — Progress Notes (Signed)
Nursing Home Location:  [facility]  Place of Service: SNF (31)  PCP: Unice Cobble, MD  No Known Allergies  Chief Complaint  Patient presents with  . Medical Management of Chronic Issues    Routine Visit    HPI:  Patient is a 65 y.o. male seen today at [facility] to follow up on chronic conditions. Pt with hx of CVA, arthrosclerosis, CAD, htn, DM, hyperlipidemia. Pt with multiple stroke history due to atherosclerosis. Had recent right MCA infarct and now with dysarthria and dysphagia and left sided weakness. HPI and ROS limited due to aphagia.  Staff reports pts mood has improved. Smiling more and moving around facility more.  Pt conts to follow up Dr Letta Pate for shoulder pain due to frozen shoulder and left sided hemiparesis.  Last month Lipitor was increased to 80 mg due to LDL not at goal.  Review of Systems:  Review of Systems  Unable to perform ROS: Other    Past Medical History:  Diagnosis Date  . Diabetes mellitus without complication ([place])   . Dysarthria   . Dysphagia   . GI bleed   . Hyperlipidemia   . Hypertension   . Stroke [facility])    Past Surgical History:  Procedure Laterality Date  . ESOPHAGOGASTRODUODENOSCOPY (EGD) WITH PROPOFOL N/A 11/03/2015   Procedure: ESOPHAGOGASTRODUODENOSCOPY (EGD) WITH PROPOFOL;  Surgeon: Manus Gunning, MD;  Location: [place];  Service: Gastroenterology;  Laterality: N/A;   Social History:   reports that he has quit smoking. His smoking use included Cigarettes. He has a 16.00 pack-year smoking history. He has never used smokeless tobacco. He reports that he does not drink alcohol or use drugs.  Family History  Problem Relation Age of Onset  . Stroke Brother   . Diabetes Brother   . Stroke Brother   . Stroke Maternal Aunt     Medications: Patient's Medications  New Prescriptions   No medications on file  Previous Medications   ACETAMINOPHEN (TYLENOL) 650 MG CR TABLET    Take 650 mg by  mouth every 4 (four) hours as needed for pain or fever.   ALLOPURINOL (ZYLOPRIM) 300 MG TABLET    Take 300 mg by mouth daily.   AMLODIPINE (NORVASC) 10 MG TABLET    Take 1 tablet (10 mg total) by mouth daily.   ASPIRIN EC 325 MG EC TABLET    Take 1 tablet (325 mg total) by mouth daily.   ATENOLOL (TENORMIN) 50 MG TABLET    Take 1 tablet (50 mg total) by mouth 2 (two) times daily.   ATORVASTATIN (LIPITOR) 80 MG TABLET    Take 80 mg by mouth daily.   CARBOXYMETHYLCELLULOSE (REFRESH PLUS) 0.5 % SOLN    Place 1 drop into both eyes 3 (three) times daily as needed (dry eyes).   CHOLECALCIFEROL (VITAMIN D) 1000 UNITS TABLET    Take 1,000 Units by mouth daily.   ENALAPRIL (VASOTEC) 20 MG TABLET    Take 1 tablet (20 mg total) by mouth daily.   FOLIC ACID (FOLVITE) 1 MG TABLET    Take 1 mg by mouth daily.   NUTRITIONAL SUPPLEMENTS (ISOSOURCE 1.5 CAL) LIQD    Give 2 cans three times daily bolus via PEG tub with 30 ml flushes before and after feeding and medications. This will provide 2250 Kcal per day.   PANTOPRAZOLE SODIUM (PROTONIX) 40 MG/20 ML PACK    Place 20 mLs (40 mg total) into feeding tube 2 (two) times daily.  VENLAFAXINE XR (EFFEXOR-XR) 75 MG 24 HR CAPSULE    Take 1 tablet via PEG tube twice daily.  Modified Medications   No medications on file  Discontinued Medications   ATORVASTATIN (LIPITOR) 40 MG TABLET    Take 1 tablet (40 mg total) by mouth daily at 6 PM.   UNABLE TO FIND    Med Name: Magic Cup by mouth three times daily   VENLAFAXINE (EFFEXOR) 37.5 MG TABLET    Place 1 tablet into feeding tube 2 (two) times daily.     Physical Exam: Vitals:   04/07/16 1126  BP: 128/72  Pulse: 66  Resp: 17  Temp: 98.1 F (36.7 C)  Weight: 156 lb (70.8 kg)  Height: 5\' 9"  (1.753 m)    Physical Exam  Constitutional: He appears well-developed and well-nourished. No distress.  HENT:  Head: Normocephalic and atraumatic.  Mouth/Throat: Oropharynx is clear and moist. No oropharyngeal exudate.    Left sided facial droop  Eyes: Conjunctivae and EOM are normal. Pupils are equal, round, and reactive to light.  Neck: Normal range of motion. Neck supple.  Cardiovascular: Normal rate, regular rhythm and normal heart sounds.   Pulmonary/Chest: Effort normal and breath sounds normal.  Abdominal: Soft. Bowel sounds are normal.  PEG tube intact   Musculoskeletal: He exhibits no edema or tenderness.  Neurological: He is alert.  Left sided hemiplegia    Skin: Skin is warm and dry. He is not diaphoretic.  Psychiatric: He has a normal mood and affect.    Labs reviewed: Basic Metabolic Panel:  Recent Labs  11/04/15 0416 11/05/15 0614 11/06/15 0515 11/07/15 0520 11/12/15 12/10/15 12/30/15  NA 144 144 145 144 139 141 140  K 4.0 3.7 3.7 4.3 4.2 4.5 4.0  CL 115* 112* 113* 112*  --   --   --   CO2 22 24 24 27   --   --   --   GLUCOSE 99 101* 128* 143*  --   --   --   BUN 42* 28* 26* 24* 29* 40* 22*  CREATININE 1.45* 1.26* 1.35* 1.16 1.3 1.3 1.2  CALCIUM 8.6* 9.0 9.0 7.4*  --   --   --   MG 2.0 2.0 2.2  --   --   --   --   PHOS 3.8 3.7 3.8  --   --   --   --    Liver Function Tests:  Recent Labs  10/23/15 1442 11/04/15 0416 11/05/15 0614 11/06/15 0515 12/30/15  AST 23  --   --   --  16  ALT 17  --   --   --  16  ALKPHOS 71  --   --   --  81  BILITOT 0.4  --   --   --   --   PROT 7.1  --   --   --   --   ALBUMIN 3.4* 2.2* 2.5* 2.4*  --    No results for input(s): LIPASE, AMYLASE in the last 8760 hours. No results for input(s): AMMONIA in the last 8760 hours. CBC:  Recent Labs  11/04/15 0416  11/05/15 0614  11/06/15 0515 11/07/15 0520 11/08/15 0526 11/12/15 12/10/15 12/30/15  WBC 12.5*  < > 15.8*  --  15.7* 12.5* 12.9* 13.3 7.9 8.3  NEUTROABS 8.7*  --  12.1*  --  12.4*  --   --   --   --   --   HGB 6.5*  < > 9.4*  < >  9.3* 8.7* 9.0* 9.9* 10.9* 11.3*  HCT 20.3*  < > 28.6*  < > 28.4* 26.5* 28.5* 31* 34* 35*  MCV 83.5  < > 82.7  --  85.3 84.7 85.1  --   --   --   PLT  267  < > 303  --  322 372 424* 544* 282 289  < > = values in this interval not displayed. TSH: No results for input(s): TSH in the last 8760 hours. A1C: Lab Results  Component Value Date   HGBA1C 6.3 02/11/2016   Lipid Panel:  Recent Labs  10/24/15 0502 02/11/16  CHOL 138 158  HDL 43 46  LDLCALC 83 96  TRIG 59 80  CHOLHDL 3.2  --      Assessment/Plan 1. Mixed hyperlipidemia Tolerating increase in lipitor, will follow up cmp  2. Late effects of CVA (cerebrovascular accident) Stable, ongoing follow up with neurology, psy medicine for left shoulder. conts on ASA 325 mg daily, will follow up cbc  3. Depression, unspecified depression type Mood has improved, conts on effexor 75 mg   4. Essential hypertension Blood pressure stable on current regimen, conts on enalapril, norvasc, atenolol. Will follow up CMP  5. Gout, unspecified cause, unspecified chronicity, unspecified site conts on allopurinol 300 mg daily, no signs of flare, will follow up uric acid level   Alvita Fana K. Harle Battiest  [facility] [phone] 8 am - 5 pm) [phone] (after hours)

## 2016-04-10 NOTE — Addendum Note (Signed)
Addended by: Lianne Cure A on: 04/10/2016 01:58 PM   Modules accepted: Orders

## 2016-04-11 LAB — HEPATIC FUNCTION PANEL
ALT: 74 U/L — AB (ref 10–40)
AST: 56 U/L — AB (ref 14–40)
Alkaline Phosphatase: 119 U/L (ref 25–125)
Bilirubin, Total: 0.4 mg/dL

## 2016-04-11 LAB — BASIC METABOLIC PANEL
BUN: 35 mg/dL — AB (ref 4–21)
Creatinine: 1.1 mg/dL (ref 0.6–1.3)
Glucose: 140 mg/dL
Potassium: 4.5 mmol/L (ref 3.4–5.3)
Sodium: 140 mmol/L (ref 137–147)

## 2016-04-11 LAB — CBC AND DIFFERENTIAL
HEMATOCRIT: 44 % (ref 41–53)
HEMOGLOBIN: 14.1 g/dL (ref 13.5–17.5)
PLATELETS: 204 10*3/uL (ref 150–399)
WBC: 8.1 10*3/mL

## 2016-04-17 ENCOUNTER — Encounter: Payer: Self-pay | Admitting: Internal Medicine

## 2016-04-17 DIAGNOSIS — F339 Major depressive disorder, recurrent, unspecified: Secondary | ICD-10-CM | POA: Insufficient documentation

## 2016-04-18 ENCOUNTER — Ambulatory Visit (HOSPITAL_BASED_OUTPATIENT_CLINIC_OR_DEPARTMENT_OTHER): Payer: Medicare Other | Admitting: Physical Medicine & Rehabilitation

## 2016-04-18 ENCOUNTER — Encounter: Payer: Medicare Other | Attending: Physical Medicine & Rehabilitation

## 2016-04-18 ENCOUNTER — Encounter: Payer: Self-pay | Admitting: Physical Medicine & Rehabilitation

## 2016-04-18 VITALS — BP 109/69 | HR 58

## 2016-04-18 DIAGNOSIS — Z8673 Personal history of transient ischemic attack (TIA), and cerebral infarction without residual deficits: Secondary | ICD-10-CM | POA: Diagnosis not present

## 2016-04-18 DIAGNOSIS — G8114 Spastic hemiplegia affecting left nondominant side: Secondary | ICD-10-CM

## 2016-04-18 DIAGNOSIS — M25512 Pain in left shoulder: Secondary | ICD-10-CM | POA: Insufficient documentation

## 2016-04-18 DIAGNOSIS — M7502 Adhesive capsulitis of left shoulder: Secondary | ICD-10-CM | POA: Diagnosis not present

## 2016-04-18 NOTE — Progress Notes (Signed)
Subjective:    Patient ID: Sean Collins, male    DOB: [birth year], 65 y.o.   MRN: [id number]  HPI Reviewed SNF notes and discussed with brother as well as pt, improved Left shoulder pain after series of 3 intra articular glenohumeral injections.  Pt communicated by wristing but does not do this very well   Pain Inventory Average Pain 0 Pain Right Now 0 My pain is no pain  In the last 24 hours, has pain interfered with the following? General activity 0 Relation with others 0 Enjoyment of life 0 What TIME of day is your pain at its worst? no pain Sleep (in general) NA  Pain is worse with: no pain Pain improves with: injections Relief from Meds: no answer  Mobility use a wheelchair needs help with transfers  Function disabled: date disabled . I need assistance with the following:  feeding, dressing, bathing, toileting, meal prep, household duties and shopping  Neuro/Psych weakness trouble walking spasms  Prior Studies Any changes since last visit?  no  Physicians involved in your care Any changes since last visit?  no   Family History  Problem Relation Age of Onset  . Stroke Brother   . Diabetes Brother   . Stroke Brother   . Stroke Maternal Aunt    Social History   Social History  . Marital status: Legally Separated    Spouse name: N/A  . Number of children: N/A  . Years of education: N/A   Social History Main Topics  . Smoking status: Former Smoker    Packs/day: 0.50    Years: 32.00    Types: Cigarettes  . Smokeless tobacco: Never Used     Comment: Started at age 24 though quit for 10 years at one point  . Alcohol use No  . Drug use: No  . Sexual activity: Not Currently   Other Topics Concern  . 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  ESOPHAGOGASTRODUODENOSCOPY (EGD) WITH PROPOFOL N/A 11/03/2015   Procedure: ESOPHAGOGASTRODUODENOSCOPY (EGD) WITH PROPOFOL;  Surgeon: Manus Gunning, MD;  Location: [place];  Service: Gastroenterology;  Laterality: N/A;   Past Medical History:  Diagnosis Date  . Diabetes mellitus without complication ([place])   . Dysarthria   . Dysphagia   . GI bleed   . Hyperlipidemia   . Hypertension   . Stroke (HCC)    BP 109/69   Pulse (!) 58   SpO2 97%   Opioid Risk Score:   Fall Risk Score:  `1  Depression screen PHQ 2/9  Depression screen PHQ 2/9 02/07/2016  Decreased Interest 0  Down, Depressed, Hopeless 0  PHQ - 2 Score 0  Altered sleeping 0  Tired, decreased energy 0  Change in appetite 0  Feeling bad or failure about yourself  0  Trouble concentrating 0  Moving slowly or fidgety/restless 3  Suicidal thoughts 0  PHQ-9 Score 3   Review of Systems  Constitutional: Negative.   HENT: Negative.   Eyes: Negative.   Respiratory: Negative.   Cardiovascular: Negative.   Gastrointestinal: Negative.   Endocrine: Negative.   Genitourinary: Negative.   Musculoskeletal: Negative.   Skin: Negative.   Allergic/Immunologic: Negative.   Neurological: Negative.   Hematological: Negative.   Psychiatric/Behavioral: Negative.  All other systems reviewed and are negative.      Objective:   Physical Exam  Constitutional: He appears well-developed and well-nourished.  HENT:  Head: Normocephalic and atraumatic.  Constant drooling  Eyes: Conjunctivae and EOM are normal. Pupils are equal, round, and reactive to light.  Musculoskeletal:       Left shoulder: He exhibits decreased range of motion and deformity. He exhibits no tenderness.  Pain with abd and forward flexion at ~100 deg, has ~30 deg of external rotation with mild endrang pain  Neurological:  [place] 3 spasticity in left finger and wrist flexors, pain with passive wrist and finger extention  Skin: Skin is warm and  dry.  Nursing note and vitals reviewed.         Assessment & Plan:  1.  Left spastic hemi with spasticity related pain in left finger and wrist flexors Will schedule for botox 200 unit FCR 50 FCU 25 FDS 50 FDP 50 FPL 25  2.  Adhesive capsulitis Left shoulder improved after series of 3 intra articular injections, last 2 were US guided, as discussed with pt and brother difficult to predict duration of relief

## 2016-04-18 NOTE — Patient Instructions (Signed)
Rec Botox LUE

## 2016-04-21 DIAGNOSIS — M6281 Muscle weakness (generalized): Secondary | ICD-10-CM | POA: Diagnosis not present

## 2016-04-21 DIAGNOSIS — I69354 Hemiplegia and hemiparesis following cerebral infarction affecting left non-dominant side: Secondary | ICD-10-CM | POA: Diagnosis not present

## 2016-04-22 DIAGNOSIS — M6281 Muscle weakness (generalized): Secondary | ICD-10-CM | POA: Diagnosis not present

## 2016-04-22 DIAGNOSIS — I69354 Hemiplegia and hemiparesis following cerebral infarction affecting left non-dominant side: Secondary | ICD-10-CM | POA: Diagnosis not present

## 2016-04-23 DIAGNOSIS — M6281 Muscle weakness (generalized): Secondary | ICD-10-CM | POA: Diagnosis not present

## 2016-04-23 DIAGNOSIS — I69354 Hemiplegia and hemiparesis following cerebral infarction affecting left non-dominant side: Secondary | ICD-10-CM | POA: Diagnosis not present

## 2016-04-24 DIAGNOSIS — M6281 Muscle weakness (generalized): Secondary | ICD-10-CM | POA: Diagnosis not present

## 2016-04-24 DIAGNOSIS — I69354 Hemiplegia and hemiparesis following cerebral infarction affecting left non-dominant side: Secondary | ICD-10-CM | POA: Diagnosis not present

## 2016-04-25 DIAGNOSIS — M6281 Muscle weakness (generalized): Secondary | ICD-10-CM | POA: Diagnosis not present

## 2016-04-25 DIAGNOSIS — I69354 Hemiplegia and hemiparesis following cerebral infarction affecting left non-dominant side: Secondary | ICD-10-CM | POA: Diagnosis not present

## 2016-04-27 DIAGNOSIS — I69354 Hemiplegia and hemiparesis following cerebral infarction affecting left non-dominant side: Secondary | ICD-10-CM | POA: Diagnosis not present

## 2016-04-27 DIAGNOSIS — M6281 Muscle weakness (generalized): Secondary | ICD-10-CM | POA: Diagnosis not present

## 2016-04-28 DIAGNOSIS — M6281 Muscle weakness (generalized): Secondary | ICD-10-CM | POA: Diagnosis not present

## 2016-04-28 DIAGNOSIS — I69354 Hemiplegia and hemiparesis following cerebral infarction affecting left non-dominant side: Secondary | ICD-10-CM | POA: Diagnosis not present

## 2016-04-29 DIAGNOSIS — M6281 Muscle weakness (generalized): Secondary | ICD-10-CM | POA: Diagnosis not present

## 2016-04-29 DIAGNOSIS — I69354 Hemiplegia and hemiparesis following cerebral infarction affecting left non-dominant side: Secondary | ICD-10-CM | POA: Diagnosis not present

## 2016-04-30 DIAGNOSIS — M6281 Muscle weakness (generalized): Secondary | ICD-10-CM | POA: Diagnosis not present

## 2016-04-30 DIAGNOSIS — I69354 Hemiplegia and hemiparesis following cerebral infarction affecting left non-dominant side: Secondary | ICD-10-CM | POA: Diagnosis not present

## 2016-05-03 DIAGNOSIS — I69354 Hemiplegia and hemiparesis following cerebral infarction affecting left non-dominant side: Secondary | ICD-10-CM | POA: Diagnosis not present

## 2016-05-03 DIAGNOSIS — M6281 Muscle weakness (generalized): Secondary | ICD-10-CM | POA: Diagnosis not present

## 2016-05-05 DIAGNOSIS — M6281 Muscle weakness (generalized): Secondary | ICD-10-CM | POA: Diagnosis not present

## 2016-05-05 DIAGNOSIS — I69354 Hemiplegia and hemiparesis following cerebral infarction affecting left non-dominant side: Secondary | ICD-10-CM | POA: Diagnosis not present

## 2016-05-06 DIAGNOSIS — M6281 Muscle weakness (generalized): Secondary | ICD-10-CM | POA: Diagnosis not present

## 2016-05-06 DIAGNOSIS — I69354 Hemiplegia and hemiparesis following cerebral infarction affecting left non-dominant side: Secondary | ICD-10-CM | POA: Diagnosis not present

## 2016-05-08 DIAGNOSIS — M6281 Muscle weakness (generalized): Secondary | ICD-10-CM | POA: Diagnosis not present

## 2016-05-08 DIAGNOSIS — I69354 Hemiplegia and hemiparesis following cerebral infarction affecting left non-dominant side: Secondary | ICD-10-CM | POA: Diagnosis not present

## 2016-05-09 DIAGNOSIS — M6281 Muscle weakness (generalized): Secondary | ICD-10-CM | POA: Diagnosis not present

## 2016-05-09 DIAGNOSIS — I69354 Hemiplegia and hemiparesis following cerebral infarction affecting left non-dominant side: Secondary | ICD-10-CM | POA: Diagnosis not present

## 2016-05-10 DIAGNOSIS — M6281 Muscle weakness (generalized): Secondary | ICD-10-CM | POA: Diagnosis not present

## 2016-05-10 DIAGNOSIS — I69354 Hemiplegia and hemiparesis following cerebral infarction affecting left non-dominant side: Secondary | ICD-10-CM | POA: Diagnosis not present

## 2016-05-11 DIAGNOSIS — I69354 Hemiplegia and hemiparesis following cerebral infarction affecting left non-dominant side: Secondary | ICD-10-CM | POA: Diagnosis not present

## 2016-05-11 DIAGNOSIS — M6281 Muscle weakness (generalized): Secondary | ICD-10-CM | POA: Diagnosis not present

## 2016-05-12 DIAGNOSIS — M6281 Muscle weakness (generalized): Secondary | ICD-10-CM | POA: Diagnosis not present

## 2016-05-12 DIAGNOSIS — I69354 Hemiplegia and hemiparesis following cerebral infarction affecting left non-dominant side: Secondary | ICD-10-CM | POA: Diagnosis not present

## 2016-05-13 DIAGNOSIS — I69354 Hemiplegia and hemiparesis following cerebral infarction affecting left non-dominant side: Secondary | ICD-10-CM | POA: Diagnosis not present

## 2016-05-13 DIAGNOSIS — M6281 Muscle weakness (generalized): Secondary | ICD-10-CM | POA: Diagnosis not present

## 2016-05-15 ENCOUNTER — Ambulatory Visit (INDEPENDENT_AMBULATORY_CARE_PROVIDER_SITE_OTHER): Payer: Medicare Other | Admitting: Neurology

## 2016-05-15 ENCOUNTER — Encounter: Payer: Self-pay | Admitting: Neurology

## 2016-05-15 VITALS — BP 106/68 | HR 70

## 2016-05-15 DIAGNOSIS — E782 Mixed hyperlipidemia: Secondary | ICD-10-CM

## 2016-05-15 DIAGNOSIS — M6281 Muscle weakness (generalized): Secondary | ICD-10-CM | POA: Diagnosis not present

## 2016-05-15 DIAGNOSIS — I6990 Unspecified sequelae of unspecified cerebrovascular disease: Secondary | ICD-10-CM

## 2016-05-15 DIAGNOSIS — I69354 Hemiplegia and hemiparesis following cerebral infarction affecting left non-dominant side: Secondary | ICD-10-CM | POA: Diagnosis not present

## 2016-05-15 DIAGNOSIS — I63311 Cerebral infarction due to thrombosis of right middle cerebral artery: Secondary | ICD-10-CM

## 2016-05-15 DIAGNOSIS — G8194 Hemiplegia, unspecified affecting left nondominant side: Secondary | ICD-10-CM

## 2016-05-15 DIAGNOSIS — I69391 Dysphagia following cerebral infarction: Secondary | ICD-10-CM

## 2016-05-15 DIAGNOSIS — K922 Gastrointestinal hemorrhage, unspecified: Secondary | ICD-10-CM | POA: Diagnosis not present

## 2016-05-15 DIAGNOSIS — Z8673 Personal history of transient ischemic attack (TIA), and cerebral infarction without residual deficits: Secondary | ICD-10-CM | POA: Diagnosis not present

## 2016-05-15 NOTE — Patient Instructions (Signed)
-   continue ASA and lipitor for stroke prevention.  - following with Dr. Anselmo Pickler for rehab - check BP at facility daily and record - aggressive PT/OT in facility to be able to walk with walker - aggressive speech therapy to evaluate swallow function and wean off PEG tube if able  - Follow up with your primary care physician for stroke risk factor modification. Recommend maintain blood pressure goal 130-150/80, diabetes with hemoglobin A1c goal below 7.0% and lipids with LDL cholesterol goal below 70 mg/dL.  - follow up with vascular surgery to monitor carotid artery stenosis. - continue abstain from smoking - follow up in 6 months.

## 2016-05-15 NOTE — Progress Notes (Signed)
STROKE NEUROLOGY FOLLOW UP NOTE  NAME: Sean Collins DOB: [birth year]  REASON FOR VISIT: stroke follow up HISTORY FROM: son and chart  Today we had the pleasure of seeing Sean Collins in follow-up at our Neurology Clinic. Pt was accompanied by son.   History Summary Sean Collins is a 66 y.o. male with history of HTN, HLD, pre-DM, previous stroke, MI s/p stent, current smoker was admitted on 10/23/15 for slurry speech and gait imbalance. MRI found to have right BG/CR punctate infarcts. MRA showed diffuse athero including right M2, BA, bilateral PCA. Carotid Doppler 40-59% right ICA stenosis. CT of the neck showed right ICA 50% stenosis with soft plaques, left ICA 40% stenosis and mild bilateral VA stenosis. TTE with EF 55-60%, and DVT negative. LDL 83 and A1c 6.1. Head aspirin 81 and Plavix PTA, increase to aspirin 325 and continue Plavix for both cardiac and stroke prevention. Resumed Lipitor. During the admission, patient symptoms getting worse, progress to left hemiplegia and severe dysarthria and dysphagia. MRI showed more prominent right BG/CR infarction. Failed swallow and CXR concerning for bilateral aspiration pneumonia, put on Unasyn and underwent PEG placement. VVS consulted and recommended follow-up as outpatient to consider right CEA. He subsequently had hematemesis (2 cups worth) and melena and was transferred to the ICU. He received 2 units PRBCs. GI performed an EGD on 6/28 which revealed active bleeding in the gastric body related to the PEG tube site. The bleeding resolved after the PEG tube site was tightened. His hemoglobin has remained stable in the 9 range and he was transferred back to [facility] . After 2 days of him being stable, Aspirin and Plavix restarted but has one episode of bloody BM at night. Both ASA and plavix were held again with Aspirin resumed the second day but not Plavix.Patient was later discharge to SNF with PEG and tube feeding and ASA and  lipitor.   History of stroke  11/2011 - left CR - MRA diffuse athero - CUS and TTE negative - on ASA  01/2015 - right semi ovale punctate infarcts - MRA diffuse athero - CUS and TTE negative - LDL 180 and A1C 6.3 - ASA + plavix + zocor  12/28/15 follow-up - the patient has been doing stable. He still has severe dysarthria and intelligible words, has to communicate with writing. Excessive saliva and not able to swallow. Still has PEG for bolus feed. As per son and note, on 12/27/15 "With improvement in his dysphagia, bolus feeding was discontinued. He now is described as having decreased mental alertness and choking with food intake". His mental status change could be related to aspiration. Today in clinic, he has not mental status change, able to communicate with me via writing, asking "for better food" "need to change the cook" "my arm is not strong". I do not think he is safe to eat at this time and he certainly needs bolus feed via PEG to maintain enough nutrition. Follow with Dr. Oneida Alar for ICA stenosis, recommend medical treatment. BP 114/70.  Interval History During the interval time, the patient has been doing nearly the same. He still has severe dysarthria and dysphasia, still on PEG for tube feeding. Still has left UE plegia, and LLE weakness. Still in wheelchair. Follow with Dr. Laury Axon for left shoulder pain and injection, currently doing better. Still in SNF, undergoing PT/OT/speech. Able to stand up with walker but not able to walk yet. BP 106/68.   REVIEW OF SYSTEMS: Full 14 system  review of systems performed and notable only for those listed below and in HPI above, all others are negative:  Constitutional:   Cardiovascular:  Ear/Nose/Throat:   Skin:  Eyes:   Respiratory:   Gastroitestinal:   Genitourinary:  Hematology/Lymphatic:   Endocrine:  Musculoskeletal:   Allergy/Immunology:   Neurological:   Psychiatric:  Sleep:   The following represents the patient's updated  allergies and side effects list: No Known Allergies  The neurologically relevant items on the patient's problem list were reviewed on today's visit.  Neurologic Examination  A problem focused neurological exam (12 or more points of the single system neurologic examination, vital signs counts as 1 point, cranial nerves count for 8 points) was performed.  Blood pressure 106/68, pulse 70.  General - Well nourished, well developed, severe dysarthria.  Ophthalmologic - Fundi not visualized due to noncooperation.  Cardiovascular - Regular rate and rhythm with no murmur.  Mental Status -  Awake, alert, severe dysarthria with intelligible words, not able to answer orientation questions. Follows commands, able to write for communication. Since able to name and repeat, however with intelligible words.  Cranial Nerves II - XII - II - Visual field intact OU. III, IV, VI - Extraocular movements intact. V - Facial sensation intact bilaterally. VII - mild left facial droop. VIII - Hearing & vestibular intact bilaterally. X - Palate elevation impaired, with severe dysarthria and intelligible words. XI - Chin turning & shoulder shrug intact bilaterally. XII - Tongue protrusion not able to do.  Motor Strength - The patient's strength was 0/5 LUE and 2/5 LLE proximal, 3+/5 knee extension with brace at left foot and 5/5 RUE and RLE.  Bulk was normal and fasciculations were absent.   Motor Tone - Muscle tone was assessed at the neck and appendages and was increased on the left UE but decreased on the left LE.  Reflexes - The patient's reflexes were 1+ in all extremities and he had no pathological reflexes.  Sensory - Light touch, temperature/pinprick were assessed and were normal.    Coordination - The patient had normal movements in the right hand with no ataxia or dysmetria.  Tremor was absent.  Gait and Station - not tested, in the wheelchair.   Data reviewed: I personally reviewed the  images and agree with the radiology interpretations.  Ct Head Wo Contrast 10/23/2015   No acute intracranial process. Chronic microvascular ischemic changes and cortical atrophy.   Mri & Mra Head/brain Wo Cm 10/23/2015   Acute RIGHT MCA territory lenticulostriate infarct, nonhemorrhagic. Atrophy and small vessel disease. Scattered foci of chronic hemorrhagic and nonhemorrhagic lacunar infarcts. Incompletely evaluated LEFT ethmoid air cell opacity, possible developing mucocele.  No proximal large vessel occlusion or flow-limiting stenosis. Intracranial atherosclerotic change most notable in the LEFT A1 ACA and LEFT P2 PCA, as well as BILATERAL MCA M2 and M3 vessels.  CTA neck 50% diameter stenosis at the origin of the right internal carotid artery. 15 mm distally there is another 50% diameter stenosis due to noncalcified atherosclerotic plaque. 40% diameter stenosis proximal left internal carotid artery. Mild stenosis at the proximal vertebral artery bilaterally. Both vertebral arteries patent.  CUS - Right: 40-59% ICA stenosis (mid to distal),highest end of scale, however, higher velocities may be obscured as patient has high bifurcation and significant plaquing. Left: 1-39% ICA stenosis. Unable to adequately visualize the distal ICA secondary to high bifurcation. Bilateral vertebral artery flow is antegrade.   LE venous doppler - There is no DVT or SVT  noted in the bilateral lower extremities.   TTE  - Left ventricle: The cavity size was normal. Wall thickness was normal. Systolic function was normal. The estimated ejection fraction was in the range of 55% to 60%. Wall motion was normal; there were no regional wall motion abnormalities. Left ventricular diastolic function parameters were normal. - Mitral valve: Calcified annulus. Mildly thickened leaflets . - Left atrium: The atrium was mildly dilated. - Right atrium: The atrium was mildly dilated. - Atrial septum: No defect or patent  foramen ovale was identified. - Tricuspid valve: There was moderate regurgitation. - Pulmonary arteries: PA peak pressure: 55 mm Hg (S). - Pericardium, extracardiac: A trivial pericardial effusion was identified.  Ct Angio Neck W Or Wo Contrast 10/24/2015  IMPRESSION: 50% diameter stenosis at the origin of the right internal carotid artery. 15 mm distally there is another 50% diameter stenosis due to noncalcified atherosclerotic plaque. 40% diameter stenosis proximal left internal carotid artery. Mild stenosis at the proximal vertebral artery bilaterally. Both vertebral arteries patent.    Mr Brain Wo Contrast 10/25/2015  IMPRESSION: Interval enlargement of right posterior corona radiata/ posterior right lenticular nucleus nonhemorrhagic infarct. Remote left frontal lobe infarct with encephalomalacia. Remote corona radiata and basal ganglia infarcts bilaterally. Moderate global atrophy without hydrocephalus. Complex persistent opacification mid to posterior left ethmoid sinus air cells. Mild mucosal thickening remainder of ethmoid sinus air cells. Minimal to mild mucosal thickening frontal sinuses. Minimal maxillary sinus mucosal thickening.  Component     Latest Ref Rng & Units 10/23/2015 10/24/2015  Cholesterol     0 - 200 mg/dL  138  Triglycerides     <150 mg/dL  59  HDL Cholesterol     >40 mg/dL  43  Total CHOL/HDL Ratio     RATIO  3.2  VLDL     0 - 40 mg/dL  12  LDL (calc)     0 - 99 mg/dL  83  Hemoglobin A1C     4.8 - 5.6 % 6.1 (H)   Mean Plasma Glucose     mg/dL 128     Assessment: As you may recall, he is a 66 y.o. African American male with PMH of HTN, HLD, pre-DM, previous stroke at left CR in 2013 and right SO in 2016, MI s/p stent, smoker was admitted on 10/23/15 for right BG/CR punctate infarcts. MRA showed diffuse athero including right M2, BA, bilateral PCA. Carotid Doppler 40-59% right ICA stenosis. CT of the neck showed right ICA 50% stenosis with soft plaques, left ICA  40% stenosis and mild bilateral VA stenosis. TTE with EF 55-60%, and DVT negative. LDL 83 and A1c 6.1. Had aspirin 81 and Plavix PTA, increase to aspirin 325 and continue Plavix for both cardiac and stroke prevention. Resumed Lipitor. During the admission, patient symptoms getting worse, progress to left hemiplegia and severe dysarthria and dysphagia. MRI showed more prominent right BG/CR infarction. Failed swallow and underwent PEG placement. VVS consulted and recommended follow-up as outpatient to consider right CEA in the future. He subsequently developed GI bleeding requiring blood transfusion. Aspirin resumed but not Plavix.Patient was later discharge to SNF with PEG and tube feeding along with ASA and lipitor. During interval time, he still has severe dysarthria and intelligible words, has to communicate with writing. Still has dysphagia and needs PEG for bolus feed. Follow with VVS for right ICA stenosis and recommend medication treatment.    Plan:  - continue ASA and lipitor for stroke prevention.  - following with  Dr. Anselmo Pickler for rehab - check BP at facility daily and record - aggressive PT/OT in facility to be able to walk with walker - aggressive speech therapy to evaluate swallow function and wean off PEG tube if able  - Follow up with your primary care physician for stroke risk factor modification. Recommend maintain blood pressure goal 130-150/80, diabetes with hemoglobin A1c goal below 7.0% and lipids with LDL cholesterol goal below 70 mg/dL.  - follow up with vascular surgery to monitor carotid artery stenosis. - continue abstain from smoking - follow up in 6 months.  I spent more than 25 minutes of face to face time with the patient. Greater than 50% of time was spent in counseling and coordination of care. We discussed tube feeding, speech / swallow evaluation, PT/OT, follow up with PMR and VVS .    No orders of the defined types were placed in this encounter.   No orders of  the defined types were placed in this encounter.   Patient Instructions  - continue ASA and lipitor for stroke prevention.  - following with Dr. Anselmo Pickler for rehab - check BP at facility daily and record - aggressive PT/OT in facility to be able to walk with walker - aggressive speech therapy to evaluate swallow function and wean off PEG tube if able  - Follow up with your primary care physician for stroke risk factor modification. Recommend maintain blood pressure goal 130-150/80, diabetes with hemoglobin A1c goal below 7.0% and lipids with LDL cholesterol goal below 70 mg/dL.  - follow up with vascular surgery to monitor carotid artery stenosis. - continue abstain from smoking - follow up in 6 months.    Rosalin Hawking, MD PhD [facility] [address] [place], [place] [address] [phone]

## 2016-05-16 DIAGNOSIS — M6281 Muscle weakness (generalized): Secondary | ICD-10-CM | POA: Diagnosis not present

## 2016-05-16 DIAGNOSIS — I69354 Hemiplegia and hemiparesis following cerebral infarction affecting left non-dominant side: Secondary | ICD-10-CM | POA: Diagnosis not present

## 2016-05-17 DIAGNOSIS — I69354 Hemiplegia and hemiparesis following cerebral infarction affecting left non-dominant side: Secondary | ICD-10-CM | POA: Diagnosis not present

## 2016-05-17 DIAGNOSIS — M6281 Muscle weakness (generalized): Secondary | ICD-10-CM | POA: Diagnosis not present

## 2016-05-18 DIAGNOSIS — M6281 Muscle weakness (generalized): Secondary | ICD-10-CM | POA: Diagnosis not present

## 2016-05-18 DIAGNOSIS — I69354 Hemiplegia and hemiparesis following cerebral infarction affecting left non-dominant side: Secondary | ICD-10-CM | POA: Diagnosis not present

## 2016-05-19 DIAGNOSIS — I69354 Hemiplegia and hemiparesis following cerebral infarction affecting left non-dominant side: Secondary | ICD-10-CM | POA: Diagnosis not present

## 2016-05-19 DIAGNOSIS — M6281 Muscle weakness (generalized): Secondary | ICD-10-CM | POA: Diagnosis not present

## 2016-05-26 ENCOUNTER — Encounter: Payer: Self-pay | Admitting: Physical Medicine & Rehabilitation

## 2016-05-26 ENCOUNTER — Ambulatory Visit (HOSPITAL_BASED_OUTPATIENT_CLINIC_OR_DEPARTMENT_OTHER): Payer: Medicare Other | Admitting: Physical Medicine & Rehabilitation

## 2016-05-26 ENCOUNTER — Encounter: Payer: Medicare Other | Attending: Physical Medicine & Rehabilitation

## 2016-05-26 VITALS — BP 117/76 | HR 94 | Resp 12

## 2016-05-26 DIAGNOSIS — Z8673 Personal history of transient ischemic attack (TIA), and cerebral infarction without residual deficits: Secondary | ICD-10-CM | POA: Insufficient documentation

## 2016-05-26 DIAGNOSIS — G8114 Spastic hemiplegia affecting left nondominant side: Secondary | ICD-10-CM

## 2016-05-26 DIAGNOSIS — M25512 Pain in left shoulder: Secondary | ICD-10-CM | POA: Insufficient documentation

## 2016-05-26 DIAGNOSIS — I639 Cerebral infarction, unspecified: Secondary | ICD-10-CM

## 2016-05-26 DIAGNOSIS — IMO0002 Reserved for concepts with insufficient information to code with codable children: Secondary | ICD-10-CM

## 2016-05-26 NOTE — Patient Instructions (Signed)

## 2016-05-26 NOTE — Progress Notes (Signed)
Botox Injection for spasticity using needle EMG guidance  Dilution: 50 Units/ml Indication: Severe spasticity which interferes with ADL,mobility and/or  hygiene and is unresponsive to medication management and other conservative care Informed consent was obtained after describing risks and benefits of the procedure with the patient. This includes bleeding, bruising, infection, excessive weakness, or medication side effects. A REMS form is on file and signed. Needle: 27g 1" needle electrode Number of units per muscle  LEFT FCR50 FCU25 FDS50 FDP50 FPL25  All injections were done after obtaining appropriate EMG activity and after negative drawback for blood. The patient tolerated the procedure well. Post procedure instructions were given. A followup appointment was made.   The most vigorous EMG activity was in left FCR

## 2016-06-09 ENCOUNTER — Non-Acute Institutional Stay (SKILLED_NURSING_FACILITY): Payer: Medicare Other | Admitting: Nurse Practitioner

## 2016-06-09 DIAGNOSIS — I6990 Unspecified sequelae of unspecified cerebrovascular disease: Secondary | ICD-10-CM | POA: Diagnosis not present

## 2016-06-09 DIAGNOSIS — D500 Iron deficiency anemia secondary to blood loss (chronic): Secondary | ICD-10-CM | POA: Diagnosis not present

## 2016-06-09 DIAGNOSIS — M109 Gout, unspecified: Secondary | ICD-10-CM

## 2016-06-09 DIAGNOSIS — Z931 Gastrostomy status: Secondary | ICD-10-CM | POA: Diagnosis not present

## 2016-06-09 DIAGNOSIS — E782 Mixed hyperlipidemia: Secondary | ICD-10-CM | POA: Diagnosis not present

## 2016-06-09 DIAGNOSIS — I10 Essential (primary) hypertension: Secondary | ICD-10-CM
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
## 2016-06-20 DIAGNOSIS — F339 Major depressive disorder, recurrent, unspecified: Secondary | ICD-10-CM | POA: Diagnosis not present

## 2016-06-20 DIAGNOSIS — F419 Anxiety disorder, unspecified: Secondary | ICD-10-CM | POA: Diagnosis not present

## 2016-06-20 DIAGNOSIS — F39 Unspecified mood [affective] disorder: Secondary | ICD-10-CM | POA: Diagnosis not present

## 2016-06-27 DIAGNOSIS — I69354 Hemiplegia and hemiparesis following cerebral infarction affecting left non-dominant side: Secondary | ICD-10-CM | POA: Diagnosis not present

## 2016-06-27 DIAGNOSIS — G8191 Hemiplegia, unspecified affecting right dominant side: Secondary | ICD-10-CM | POA: Diagnosis not present

## 2016-06-28 DIAGNOSIS — I69354 Hemiplegia and hemiparesis following cerebral infarction affecting left non-dominant side: Secondary | ICD-10-CM | POA: Diagnosis not present

## 2016-06-28 DIAGNOSIS — G8191 Hemiplegia, unspecified affecting right dominant side: Secondary | ICD-10-CM | POA: Diagnosis not present

## 2016-06-29 DIAGNOSIS — G8191 Hemiplegia, unspecified affecting right dominant side: Secondary | ICD-10-CM | POA: Diagnosis not present

## 2016-06-29 DIAGNOSIS — I69354 Hemiplegia and hemiparesis following cerebral infarction affecting left non-dominant side: Secondary | ICD-10-CM | POA: Diagnosis not present

## 2016-06-30 DIAGNOSIS — I69354 Hemiplegia and hemiparesis following cerebral infarction affecting left non-dominant side: Secondary | ICD-10-CM | POA: Diagnosis not present

## 2016-06-30 DIAGNOSIS — G8191 Hemiplegia, unspecified affecting right dominant side: Secondary | ICD-10-CM | POA: Diagnosis not present

## 2016-07-03 DIAGNOSIS — G8191 Hemiplegia, unspecified affecting right dominant side: Secondary | ICD-10-CM | POA: Diagnosis not present

## 2016-07-03 DIAGNOSIS — I69354 Hemiplegia and hemiparesis following cerebral infarction affecting left non-dominant side: Secondary | ICD-10-CM | POA: Diagnosis not present

## 2016-07-04 DIAGNOSIS — I69354 Hemiplegia and hemiparesis following cerebral infarction affecting left non-dominant side: Secondary | ICD-10-CM | POA: Diagnosis not present

## 2016-07-04 DIAGNOSIS — R05 Cough: Secondary | ICD-10-CM | POA: Diagnosis not present

## 2016-07-04 DIAGNOSIS — G8191 Hemiplegia, unspecified affecting right dominant side: Secondary | ICD-10-CM | POA: Diagnosis not present

## 2016-07-05 ENCOUNTER — Non-Acute Institutional Stay (SKILLED_NURSING_FACILITY): Payer: Medicare Other | Admitting: Nurse Practitioner

## 2016-07-05 ENCOUNTER — Encounter: Payer: Self-pay | Admitting: Nurse Practitioner

## 2016-07-05 DIAGNOSIS — F39 Unspecified mood [affective] disorder: Secondary | ICD-10-CM | POA: Diagnosis not present

## 2016-07-05 DIAGNOSIS — J189 Pneumonia, unspecified organism: Secondary | ICD-10-CM

## 2016-07-05 DIAGNOSIS — I69354 Hemiplegia and hemiparesis following cerebral infarction affecting left non-dominant side: Secondary | ICD-10-CM | POA: Diagnosis not present

## 2016-07-05 DIAGNOSIS — F419 Anxiety disorder, unspecified: Secondary | ICD-10-CM | POA: Diagnosis not present

## 2016-07-05 DIAGNOSIS — F339 Major depressive disorder, recurrent, unspecified: Secondary | ICD-10-CM | POA: Diagnosis not present

## 2016-07-05 DIAGNOSIS — G3184 Mild cognitive impairment, so stated: Secondary | ICD-10-CM | POA: Diagnosis not present

## 2016-07-05 DIAGNOSIS — G8191 Hemiplegia, unspecified affecting right dominant side: Secondary | ICD-10-CM | POA: Diagnosis not present

## 2016-07-05 NOTE — Progress Notes (Signed)
Nursing Home Location:  [facility]  Place of Service: SNF (31)  PCP: Unice Cobble, MD  No Known Allergies  Chief Complaint  Patient presents with  . Acute Visit    Resident is beeing seen for cough.    HPI:  Patient is a 66 y.o. male seen today at [facility] for an ongoing cough. On-call MD notified of cough 07/04/16, PCXR ordered which indicated mild atelectatic changes/PNA to bilateral lower lobes, greater on the left. Pt with hx of CVA, arthrosclerosis, CAD, htn, DM, hyperlipidemia. Pt with multiple stroke history due to atherosclerosis and unable to provide adequate ROS information, however according to his roommate and staff nurse the cough has been almost constant in nature. He has been afebrile. No apparent SOB, tachypnea, N/V/D.    Review of Systems:  Review of Systems  Unable to perform ROS: Other    Past Medical History:  Diagnosis Date  . Diabetes mellitus without complication ([place])   . Dysarthria   . Dysphagia   . GI bleed   . Hyperlipidemia   . Hypertension   . Stroke [facility])    Past Surgical History:  Procedure Laterality Date  . ESOPHAGOGASTRODUODENOSCOPY (EGD) WITH PROPOFOL N/A 11/03/2015   Procedure: ESOPHAGOGASTRODUODENOSCOPY (EGD) WITH PROPOFOL;  Surgeon: Manus Gunning, MD;  Location: [place];  Service: Gastroenterology;  Laterality: N/A;   Social History:   reports that he has quit smoking. His smoking use included Cigarettes. He has a 16.00 pack-year smoking history. He has never used smokeless tobacco. He reports that he does not drink alcohol or use drugs.  Family History  Problem Relation Age of Onset  . Stroke Brother   . Diabetes Brother   . Stroke Brother   . Stroke Maternal Aunt     Medications: Patient's Medications  New Prescriptions   No medications on file  Previous Medications   ACETAMINOPHEN (TYLENOL) 650 MG CR TABLET    Take 650 mg by mouth every 4 (four) hours as needed for pain or fever.   ALLOPURINOL (ZYLOPRIM) 300 MG TABLET    Place 300 mg into feeding tube daily.    AMLODIPINE (NORVASC) 10 MG TABLET    Place 10 mg into feeding tube daily.   ASPIRIN EC 325 MG EC TABLET    Take 1 tablet (325 mg total) by mouth daily.   ATENOLOL (TENORMIN) 50 MG TABLET    Take 1 tablet (50 mg total) by mouth 2 (two) times daily.   ATORVASTATIN (LIPITOR) 80 MG TABLET    Place 80 mg into feeding tube daily.    CARBOXYMETHYLCELLULOSE (REFRESH PLUS) 0.5 % SOLN    Place 1 drop into both eyes 3 (three) times daily as needed (dry eyes).   CHOLECALCIFEROL (VITAMIN D) 1000 UNITS TABLET    1,000 Units daily.    DIVALPROEX (DEPAKOTE) 250 MG DR TABLET    Take 250 mg by mouth 2 (two) times daily.   ENALAPRIL (VASOTEC) 20 MG TABLET    Take 1 tablet (20 mg total) by mouth daily.   FOLIC ACID (FOLVITE) 1 MG TABLET    Place 1 mg into feeding tube daily.    IPRATROPIUM-ALBUTEROL (DUONEB) 0.5-2.5 (3) MG/3ML SOLN    Take 3 mLs by nebulization every 6 (six) hours as needed. Stop date: 07/11/16   LEVOFLOXACIN (LEVAQUIN) 500 MG TABLET    Place 500 mg into feeding tube daily. Stop date: 07/11/16   NUTRITIONAL SUPPLEMENTS (ISOSOURCE 1.5 CAL) LIQD    Give 2  cans three times daily bolus via PEG tub with 30 ml flushes before and after feeding and medications. This will provide 2250 Kcal per day.   PANTOPRAZOLE SODIUM (PROTONIX) 40 MG/20 ML PACK    Place 20 mLs (40 mg total) into feeding tube 2 (two) times daily.   SACCHAROMYCES BOULARDII (FLORASTORMAX) 500 MG PACK    Place 1 tablet into feeding tube 2 (two) times daily. Stop date: 07/11/16  Modified Medications   No medications on file  Discontinued Medications   VENLAFAXINE XR (EFFEXOR-XR) 75 MG 24 HR CAPSULE    Take 1 tablet via PEG tube twice daily.     Physical Exam: Vitals:   07/05/16 1056  BP: 115/69  Pulse: 76  Resp: 20  Temp: 98.4 F (36.9 C)  SpO2: 94%  Weight: 154 lb 9.6 oz (70.1 kg)  Height: 5\' 9"  (1.753 m)    Physical Exam  Constitutional: He appears  well-developed and well-nourished. He appears ill. No distress.  HENT:  Head: Normocephalic and atraumatic.  Mouth/Throat: Oropharynx is clear and moist. No oropharyngeal exudate.  Left sided facial droop  Eyes: Conjunctivae and EOM are normal. Pupils are equal, round, and reactive to light. Right eye exhibits no discharge. Left eye exhibits no discharge.  Neck: Normal range of motion. Neck supple.  Cardiovascular: Normal rate, regular rhythm and normal heart sounds.   No murmur heard. Pulmonary/Chest: Effort normal. No accessory muscle usage. No tachypnea and no bradypnea. No respiratory distress. He has decreased breath sounds in the left upper field and the left lower field. He has no wheezes. He has no rhonchi. He has no rales.  Unable to perform egophony, bronchophony due to decreased mentation   Abdominal: Soft. Bowel sounds are normal.  PEG tube intact   Musculoskeletal: He exhibits no edema or tenderness.  Neurological: He is alert.  Left sided hemiplegia    Skin: Skin is warm and dry. He is not diaphoretic.  Psychiatric: He has a normal mood and affect.    Labs reviewed: Basic Metabolic Panel:  Recent Labs  11/04/15 0416 11/05/15 0614 11/06/15 0515 11/07/15 0520  12/10/15 12/30/15 04/11/16  NA 144 144 145 144  < > 141 140 140  K 4.0 3.7 3.7 4.3  < > 4.5 4.0 4.5  CL 115* 112* 113* 112*  --   --   --   --   CO2 22 24 24 27   --   --   --   --   GLUCOSE 99 101* 128* 143*  --   --   --   --   BUN 42* 28* 26* 24*  < > 40* 22* 35*  CREATININE 1.45* 1.26* 1.35* 1.16  < > 1.3 1.2 1.1  CALCIUM 8.6* 9.0 9.0 7.4*  --   --   --   --   MG 2.0 2.0 2.2  --   --   --   --   --   PHOS 3.8 3.7 3.8  --   --   --   --   --   < > = values in this interval not displayed. Liver Function Tests:  Recent Labs  10/23/15 1442 11/04/15 0416 11/05/15 0614 11/06/15 0515 12/30/15 04/11/16  AST 23  --   --   --  16 56*  ALT 17  --   --   --  16 74*  ALKPHOS 71  --   --   --  81 119  BILITOT  0.4  --   --   --   --   --  PROT 7.1  --   --   --   --   --   ALBUMIN 3.4* 2.2* 2.5* 2.4*  --   --    No results for input(s): LIPASE, AMYLASE in the last 8760 hours. No results for input(s): AMMONIA in the last 8760 hours. CBC:  Recent Labs  11/04/15 0416  11/05/15 0614  11/06/15 0515 11/07/15 0520 11/08/15 0526  12/10/15 12/30/15 04/11/16  WBC 12.5*  < > 15.8*  --  15.7* 12.5* 12.9*  < > 7.9 8.3 8.1  NEUTROABS 8.7*  --  12.1*  --  12.4*  --   --   --   --   --   --   HGB 6.5*  < > 9.4*  < > 9.3* 8.7* 9.0*  < > 10.9* 11.3* 14.1  HCT 20.3*  < > 28.6*  < > 28.4* 26.5* 28.5*  < > 34* 35* 44  MCV 83.5  < > 82.7  --  85.3 84.7 85.1  --   --   --   --   PLT 267  < > 303  --  322 372 424*  < > 282 289 204  < > = values in this interval not displayed. TSH: No results for input(s): TSH in the last 8760 hours. A1C: Lab Results  Component Value Date   HGBA1C 6.3 02/11/2016   Lipid Panel:  Recent Labs  10/24/15 0502 02/11/16  CHOL 138 158  HDL 43 46  LDLCALC 83 96  TRIG 59 80  CHOLHDL 3.2  --    Uric Acid 04/11/16: 5.4   Assessment/Plan  1. Pneumonia of left lung due to infectious organism, unspecified part of lung -PCXR confirms atelectatic changes/pneumonia in bilateral lower lobes, greater on the left. -Pt started on Levaquin 500mg  x 7 days -Will add Diabetic Tussin Cough and Chest Congestion DM for night time cough.  -Pt receiving duoneb Q6H PRN for cough, wheezing- will schedule q 6 hour x 5 days.  -Staff to monitor patient for increases in fevers, chills, mylagias or cough/sputum production.  -Maintain adequate hydration  -Will follow up post abx therapy or sooner if needed.    Carlos American. Harle Battiest  [facility] [phone] 8 am - 5 pm) [phone] (after hours)

## 2016-07-06 DIAGNOSIS — I69354 Hemiplegia and hemiparesis following cerebral infarction affecting left non-dominant side: Secondary | ICD-10-CM | POA: Diagnosis not present

## 2016-07-06 DIAGNOSIS — G8191 Hemiplegia, unspecified affecting right dominant side: Secondary | ICD-10-CM | POA: Diagnosis not present

## 2016-07-07 ENCOUNTER — Ambulatory Visit: Payer: Medicaid Other | Admitting: Physical Medicine & Rehabilitation

## 2016-07-07 DIAGNOSIS — G8191 Hemiplegia, unspecified affecting right dominant side: Secondary | ICD-10-CM | POA: Diagnosis not present

## 2016-07-07 DIAGNOSIS — I69354 Hemiplegia and hemiparesis following cerebral infarction affecting left non-dominant side: Secondary | ICD-10-CM | POA: Diagnosis not present

## 2016-07-10 DIAGNOSIS — G8191 Hemiplegia, unspecified affecting right dominant side: Secondary | ICD-10-CM | POA: Diagnosis not present

## 2016-07-10 DIAGNOSIS — I69354 Hemiplegia and hemiparesis following cerebral infarction affecting left non-dominant side: Secondary | ICD-10-CM | POA: Diagnosis not present

## 2016-07-11 ENCOUNTER — Ambulatory Visit (HOSPITAL_BASED_OUTPATIENT_CLINIC_OR_DEPARTMENT_OTHER): Payer: Medicare Other | Admitting: Physical Medicine & Rehabilitation

## 2016-07-11 ENCOUNTER — Encounter: Payer: Medicare Other | Attending: Physical Medicine & Rehabilitation

## 2016-07-11 ENCOUNTER — Non-Acute Institutional Stay (SKILLED_NURSING_FACILITY): Payer: Medicare Other | Admitting: Internal Medicine

## 2016-07-11 ENCOUNTER — Encounter: Payer: Self-pay | Admitting: Internal Medicine

## 2016-07-11 ENCOUNTER — Encounter: Payer: Self-pay | Admitting: Physical Medicine & Rehabilitation

## 2016-07-11 VITALS — BP 97/62 | HR 72

## 2016-07-11 DIAGNOSIS — Z8673 Personal history of transient ischemic attack (TIA), and cerebral infarction without residual deficits: Secondary | ICD-10-CM | POA: Diagnosis not present

## 2016-07-11 DIAGNOSIS — I639 Cerebral infarction, unspecified: Secondary | ICD-10-CM

## 2016-07-11 DIAGNOSIS — M25512 Pain in left shoulder: Secondary | ICD-10-CM

## 2016-07-11 DIAGNOSIS — IMO0002 Reserved for concepts with insufficient information to code with codable children: Secondary | ICD-10-CM

## 2016-07-11 DIAGNOSIS — G8114 Spastic hemiplegia affecting left nondominant side: Secondary | ICD-10-CM

## 2016-07-11 DIAGNOSIS — G8929 Other chronic pain: Secondary | ICD-10-CM | POA: Diagnosis not present

## 2016-07-11 DIAGNOSIS — R296 Repeated falls: Secondary | ICD-10-CM

## 2016-07-11 DIAGNOSIS — I69354 Hemiplegia and hemiparesis following cerebral infarction affecting left non-dominant side: Secondary | ICD-10-CM | POA: Diagnosis not present

## 2016-07-11 DIAGNOSIS — G8191 Hemiplegia, unspecified affecting right dominant side: Secondary | ICD-10-CM | POA: Diagnosis not present

## 2016-07-11 NOTE — Patient Instructions (Signed)
No need for shoulder injection  Will repeat botox to L wrist and hand flexors   Rec Left resting hand splint to be worn at night

## 2016-07-11 NOTE — Progress Notes (Signed)
    Facility Location: [facility]  Room Number: 105  Code Status: Full Code    This is a nursing facility follow up for specific acute issue of fall.  Interim medical record and care since last [place] visit was updated with review of diagnostic studies and change in clinical status since last visit were documented.  XFG:HWEXHB Solomon,PMHNP, [place] has diagnosed hypomanic restlessness, typically late in the day in the context of recurrent depression, mood disorder, anxiety, and mild cognitive impairment. She has ordered Depakote sprinkles 250 mg twice a day. PT/OT feels that the patient retropulses or falls to left when he attempts to get up as he has no strength on the left. The patient is receiving Botox from Dr. Letta Pate to the left wrist and hand flexors for spastic hemiparesis of the left nondominant side related to previous cerebral infarction. He has also ordered a left resting hand splint to be worn at night. He has completed 3 courses of glenohumeral injections under US guidance for adhesive capsulitis of the left shoulder. The patient is on a calcium channel blocker, ACE inhibitor, and beta blocker. He is on a PPI twice a day but has a history of upper GI bleed and dysphagia.  Review of systems: He is unable to provide any meaningful history due to his dysarthria. He seems to indicate he has some discomfort in the right knee as he slaps his knee when asked if he is having any active issues.   Physical exam:  Pertinent or positive findings: Profound dysarthria present with responses as grunts. Thick coating of the teeth and oropharynx. Distant and irregular heart sounds. Decreased breath sounds. PEG tube present. Decreased pedal pulses. Left hemiplegia with essentially no strength to opposition. Strong upper and lower extremities on the right.  General appearance:Adequately nourished; no acute distress , increased work of  breathing is present.   Lymphatic: No lymphadenopathy about the head, neck, axilla . Eyes: No conjunctival inflammation or lid edema is present. There is no scleral icterus. Ears:  External ear exam shows no significant lesions or deformities.   Nose:  External nasal examination shows no deformity or inflammation. Nasal mucosa are pink and moist without lesions ,exudates Oral exam: lips and gums are healthy appearing.There is no oropharyngeal erythema or exudate . Neck:  No thyromegaly, masses, tenderness noted.    Heart:  No gallop, murmur, click, rub .  Lungs:Chest clear to auscultation without wheezes, rhonchi,rales , rubs. Abdomen:Bowel sounds are normal. Abdomen is soft and nontender with no organomegaly, hernias,masses. GU: deferred  Extremities:  No cyanosis, clubbing,edema  Neurologic exam : Balance,Rhomberg,finger to nose testing could not be completed due to clinical state Skin: Warm & dry w/o tenting. No significant lesions or rash.  See summary under each active problem in the Problem List with associated updated therapeutic plan

## 2016-07-11 NOTE — Assessment & Plan Note (Signed)
Major issue is to be his lack of compliance with safety recommendations. Postural blood pressures will be checked in PT/OT.

## 2016-07-11 NOTE — Progress Notes (Signed)
Subjective:    Patient ID: Sean Collins, male    DOB: [birth year], 66 y.o.   MRN: [id number]  HPI Botox 05/26/16 FCR50 FCU25 FDS50 FDP50 FPL25  Patient feels like left hand is looser. Shoulder is not causing any pain right now, underwent ultrasound-guided glenohumeral injections for adhesive capsulitis, left shoulder.  Severely dysarthric need to use yes no questioning to elicit response  Pain Inventory Average Pain 2 Pain Right Now 2 My pain is .  In the last 24 hours, has pain interfered with the following? General activity 2 Relation with others 2 Enjoyment of life 2 What TIME of day is your pain at its worst? night Sleep (in general) Good  Pain is worse with: some activites Pain improves with: rest Relief from Meds: .  Mobility ability to climb steps?  no do you drive?  no use a wheelchair needs help with transfers  Function disabled: date disabled .  Neuro/Psych trouble walking  Prior Studies Any changes since last visit?  no  Physicians involved in your care Any changes since last visit?  no   Family History  Problem Relation Age of Onset  . Stroke Brother   . Diabetes Brother   . Stroke Brother   . Stroke Maternal Aunt    Social History   Social History  . Marital status: Sean Collins    Spouse name: N/A  . Number of children: N/A  . Years of education: N/A   Social History Main Topics  . Smoking status: Former Smoker    Packs/day: 0.50    Years: 32.00    Types: Cigarettes  . Smokeless tobacco: Never Used     Comment: Started at age 10 though quit for 10 years at one point  . Alcohol use No  . Drug use: No  . Sexual activity: Not Currently   Other Topics Concern  . 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  ESOPHAGOGASTRODUODENOSCOPY (EGD) WITH PROPOFOL N/A 11/03/2015   Procedure: ESOPHAGOGASTRODUODENOSCOPY (EGD) WITH PROPOFOL;  Surgeon: Manus Gunning, MD;  Location: [place];  Service: Gastroenterology;  Laterality: N/A;   Past Medical History:  Diagnosis Date  . Diabetes mellitus without complication ([place])   . Dysarthria   . Dysphagia   . GI bleed   . Hyperlipidemia   . Hypertension   . Stroke [facility])    There were no vitals taken for this visit.  Opioid Risk Score:   Fall Risk Score:  `1  Depression screen PHQ 2/9  Depression screen PHQ 2/9 02/07/2016  Decreased Interest 0  Down, Depressed, Hopeless 0  PHQ - 2 Score 0  Altered sleeping 0  Tired, decreased energy 0  Change in appetite 0  Feeling bad or failure about yourself  0  Trouble concentrating 0  Moving slowly or fidgety/restless 3  Suicidal thoughts 0  PHQ-9 Score 3    Review of Systems  Constitutional: Negative.   HENT: Negative.   Eyes: Negative.   Respiratory: Positive for cough.   Cardiovascular: Negative.   Gastrointestinal: Negative.   Endocrine: Negative.   Genitourinary: Negative.   Musculoskeletal: Positive for gait problem.  Skin: Negative.   Allergic/Immunologic: Negative.   Hematological: Negative.   Psychiatric/Behavioral: Negative.   All other systems  reviewed and are negative.      Objective:   Physical Exam  Constitutional: He appears well-developed and well-nourished.  HENT:  Head: Normocephalic and atraumatic.  Eyes: Conjunctivae are normal. Pupils are equal, round, and reactive to light.  Abdominal:  PEG site clean, dry,  No drainage  Psychiatric: He has a normal mood and affect.  Nursing note and vitals reviewed. , Severe dysarthria Follows commands Motor strength is 2 minus in the left deltoid, biceps, triceps, trace finger flexors. Patient has no pain with external rotation but does have limited external rotation of the left shoulder. Positive impingement at  30.        Assessment & Plan:Right CVA resulting in left spastic hemiplegia. He has severe spasticity in the wrist and finger flexors. Improved after Botox    recommend left wrist hand orthosis, to be worn at night to help keep fingers and wrist in an extended position  Repeat Botox 200 units total same dosing and muscle selection No sooner than 08/24/2016  2. Left shoulder adhesive capsulitis improvement in pain, status post series of 3 ultrasound-guided glenohumeral injections  Discussed recommendations with the patient as well as his brother who accompanies him to the office today

## 2016-07-11 NOTE — Assessment & Plan Note (Signed)
As per Dr Letta Pate

## 2016-07-11 NOTE — Patient Instructions (Signed)
See assessment and plan under each diagnosis in the problem list and acutely for this visit 

## 2016-07-11 NOTE — Assessment & Plan Note (Addendum)
Dr Lynford Citizen is prescribing Botox to the left wrist and hand flexors

## 2016-07-13 DIAGNOSIS — G8191 Hemiplegia, unspecified affecting right dominant side: Secondary | ICD-10-CM | POA: Diagnosis not present

## 2016-07-13 DIAGNOSIS — I69354 Hemiplegia and hemiparesis following cerebral infarction affecting left non-dominant side: Secondary | ICD-10-CM | POA: Diagnosis not present

## 2016-07-14 DIAGNOSIS — I69354 Hemiplegia and hemiparesis following cerebral infarction affecting left non-dominant side: Secondary | ICD-10-CM | POA: Diagnosis not present

## 2016-07-14 DIAGNOSIS — G8191 Hemiplegia, unspecified affecting right dominant side: Secondary | ICD-10-CM | POA: Diagnosis not present

## 2016-07-16 DIAGNOSIS — I69354 Hemiplegia and hemiparesis following cerebral infarction affecting left non-dominant side: Secondary | ICD-10-CM | POA: Diagnosis not present

## 2016-07-16 DIAGNOSIS — G8191 Hemiplegia, unspecified affecting right dominant side: Secondary | ICD-10-CM | POA: Diagnosis not present

## 2016-07-18 DIAGNOSIS — G8191 Hemiplegia, unspecified affecting right dominant side: Secondary | ICD-10-CM | POA: Diagnosis not present

## 2016-07-18 DIAGNOSIS — I69354 Hemiplegia and hemiparesis following cerebral infarction affecting left non-dominant side: Secondary | ICD-10-CM | POA: Diagnosis not present

## 2016-07-19 DIAGNOSIS — I69354 Hemiplegia and hemiparesis following cerebral infarction affecting left non-dominant side: Secondary | ICD-10-CM | POA: Diagnosis not present

## 2016-07-19 DIAGNOSIS — G8191 Hemiplegia, unspecified affecting right dominant side: Secondary | ICD-10-CM | POA: Diagnosis not present

## 2016-07-20 DIAGNOSIS — G8191 Hemiplegia, unspecified affecting right dominant side: Secondary | ICD-10-CM | POA: Diagnosis not present

## 2016-07-20 DIAGNOSIS — I69354 Hemiplegia and hemiparesis following cerebral infarction affecting left non-dominant side: Secondary | ICD-10-CM | POA: Diagnosis not present

## 2016-07-21 DIAGNOSIS — G8191 Hemiplegia, unspecified affecting right dominant side: Secondary | ICD-10-CM | POA: Diagnosis not present

## 2016-07-21 DIAGNOSIS — I69354 Hemiplegia and hemiparesis following cerebral infarction affecting left non-dominant side: Secondary | ICD-10-CM | POA: Diagnosis not present

## 2016-07-23 DIAGNOSIS — I69354 Hemiplegia and hemiparesis following cerebral infarction affecting left non-dominant side: Secondary | ICD-10-CM | POA: Diagnosis not present

## 2016-07-23 DIAGNOSIS — G8191 Hemiplegia, unspecified affecting right dominant side: Secondary | ICD-10-CM | POA: Diagnosis not present

## 2016-08-02 DIAGNOSIS — F39 Unspecified mood [affective] disorder: Secondary | ICD-10-CM | POA: Diagnosis not present

## 2016-08-02 DIAGNOSIS — F339 Major depressive disorder, recurrent, unspecified: Secondary | ICD-10-CM | POA: Diagnosis not present

## 2016-08-02 DIAGNOSIS — R4189 Other symptoms and signs involving cognitive functions and awareness: Secondary | ICD-10-CM | POA: Diagnosis not present

## 2016-08-02 DIAGNOSIS — F22 Delusional disorders: Secondary | ICD-10-CM | POA: Diagnosis not present

## 2016-08-09 ENCOUNTER — Non-Acute Institutional Stay (SKILLED_NURSING_FACILITY): Payer: Medicare Other | Admitting: Nurse Practitioner

## 2016-08-09 ENCOUNTER — Encounter: Payer: Self-pay | Admitting: Nurse Practitioner

## 2016-08-09 DIAGNOSIS — I10 Essential (primary) hypertension: Secondary | ICD-10-CM

## 2016-08-09 DIAGNOSIS — M6281 Muscle weakness (generalized): Secondary | ICD-10-CM | POA: Diagnosis not present

## 2016-08-09 DIAGNOSIS — E782 Mixed hyperlipidemia: Secondary | ICD-10-CM | POA: Diagnosis not present

## 2016-08-09 DIAGNOSIS — M24542 Contracture, left hand: Secondary | ICD-10-CM | POA: Diagnosis not present

## 2016-08-09 DIAGNOSIS — I6990 Unspecified sequelae of unspecified cerebrovascular disease: Secondary | ICD-10-CM

## 2016-08-09 DIAGNOSIS — Z931 Gastrostomy status: Secondary | ICD-10-CM

## 2016-08-09 DIAGNOSIS — I69354 Hemiplegia and hemiparesis following cerebral infarction affecting left non-dominant side: Secondary | ICD-10-CM | POA: Diagnosis not present

## 2016-08-09 NOTE — Progress Notes (Signed)
Nursing Home Location:  [facility]  Place of Service: SNF (31)  PCP: Unice Cobble, MD  No Known Allergies  Chief Complaint  Patient presents with  . Medical Management of Chronic Issues    Resident is being seen for a routine visit.     HPI:  Patient is a 66 y.o. male seen today at [facility] to follow up on chronic conditions. Pt with hx of CVA, arthrosclerosis, CAD, HTN, DM, hyperlipidemia. Pt with multiple stroke history due to atherosclerosis. He was recently treated for PNA 2/28. Completed a course of Levaquin and Duoneb treatments. He nods that he is feeling much better and that his cough is no longer present.  Pt conts to follow with Dr Read Drivers due to spastic hemiparesis for botox injections. Pt up in [facility] today. Non-verbal, however able to communicate fairly well. Staff has no concerns. Pt conts to follow up with neurology due to CVA. Nutrition provided via PEG. Weight remains stable, 161lb today, up from 154lb 3/6.  Ongoing falls noted because pt feels like he can transfer/get up without assistance. No injuries noted from falls.  Review of Systems:  Review of Systems  Unable to perform ROS: Other    Past Medical History:  Diagnosis Date  . Diabetes mellitus without complication ([place])   . Dysarthria   . Dysphagia   . GI bleed   . Hyperlipidemia   . Hypertension   . Stroke [facility])    Past Surgical History:  Procedure Laterality Date  . ESOPHAGOGASTRODUODENOSCOPY (EGD) WITH PROPOFOL N/A 11/03/2015   Procedure: ESOPHAGOGASTRODUODENOSCOPY (EGD) WITH PROPOFOL;  Surgeon: Manus Gunning, MD;  Location: [place];  Service: Gastroenterology;  Laterality: N/A;   Social History:   reports that he has quit smoking. His smoking use included Cigarettes. He has a 16.00 pack-year smoking history. He has never used smokeless tobacco. He reports that he does not drink alcohol or use drugs.  Family History  Problem Relation Age of Onset  . Stroke  Brother   . Diabetes Brother   . Stroke Brother   . Stroke Maternal Aunt     Medications: Patient's Medications  New Prescriptions   No medications on file  Previous Medications   ACETAMINOPHEN (TYLENOL) 650 MG CR TABLET    Take 650 mg by mouth every 4 (four) hours as needed for pain or fever.   ALLOPURINOL (ZYLOPRIM) 300 MG TABLET    Place 300 mg into feeding tube daily.    AMLODIPINE (NORVASC) 10 MG TABLET    Place 10 mg into feeding tube daily.   ASPIRIN EC 325 MG EC TABLET    Take 1 tablet (325 mg total) by mouth daily.   ATENOLOL (TENORMIN) 50 MG TABLET    Take 1 tablet (50 mg total) by mouth 2 (two) times daily.   ATORVASTATIN (LIPITOR) 80 MG TABLET    Place 80 mg into feeding tube daily.    CARBOXYMETHYLCELLULOSE (REFRESH PLUS) 0.5 % SOLN    Place 1 drop into both eyes 3 (three) times daily as needed (dry eyes).   CHOLECALCIFEROL (VITAMIN D) 1000 UNITS TABLET    1,000 Units daily.    DIVALPROEX (DEPAKOTE) 250 MG DR TABLET    Take 250 mg by mouth 2 (two) times daily.   ENALAPRIL (VASOTEC) 20 MG TABLET    Take 1 tablet (20 mg total) by mouth daily.   FOLIC ACID (FOLVITE) 1 MG TABLET    Place 1 mg into feeding tube daily.  NUTRITIONAL SUPPLEMENTS (ISOSOURCE 1.5 CAL) LIQD    Give 2 cans three times daily bolus via PEG tub with 30 ml flushes before and after feeding and medications. This will provide 2250 Kcal per day.   PANTOPRAZOLE SODIUM (PROTONIX) 40 MG/20 ML PACK    Place 20 mLs (40 mg total) into feeding tube 2 (two) times daily.   QUETIAPINE (SEROQUEL) 25 MG TABLET    Take 25 mg by mouth 2 (two) times daily. 1200 and 1800  Modified Medications   No medications on file  Discontinued Medications   No medications on file     Physical Exam: Vitals:   08/09/16 0931  BP: 111/70  Pulse: (!) 52  Resp: 18  Temp: 97.6 F (36.4 C)  SpO2: 97%  Weight: 161 lb 9.6 oz (73.3 kg)  Height: 5\' 9"  (1.753 m)    Physical Exam  Constitutional: He appears well-developed and  well-nourished. No distress.  HENT:  Head: Normocephalic and atraumatic.  Mouth/Throat: Oropharynx is clear and moist. No oropharyngeal exudate.  Left sided facial droop  Eyes: Conjunctivae and EOM are normal. Pupils are equal, round, and reactive to light.  Neck: Normal range of motion. Neck supple.  Cardiovascular: Normal rate, regular rhythm and normal heart sounds.   Pulmonary/Chest: Effort normal and breath sounds normal. No respiratory distress. He has no wheezes. He has no rales.  Abdominal: Soft. Bowel sounds are normal. He exhibits no distension. There is no tenderness. There is no guarding.  PEG tube intact   Musculoskeletal: He exhibits no edema or tenderness.  Decreased ROM in the left shoulder, WNL in right.  2/5 grip strength in left hand, 5/5 in right. Bilateral strength in lower extremities=4/5  Neurological: He is alert.  Left sided hemiplegia    Skin: Skin is warm and dry. He is not diaphoretic.  Psychiatric: He has a normal mood and affect.    Labs reviewed: Basic Metabolic Panel:  Recent Labs  11/04/15 0416 11/05/15 0614 11/06/15 0515 11/07/15 0520  12/10/15 12/30/15 04/11/16  NA 144 144 145 144  < > 141 140 140  K 4.0 3.7 3.7 4.3  < > 4.5 4.0 4.5  CL 115* 112* 113* 112*  --   --   --   --   CO2 22 24 24 27   --   --   --   --   GLUCOSE 99 101* 128* 143*  --   --   --   --   BUN 42* 28* 26* 24*  < > 40* 22* 35*  CREATININE 1.45* 1.26* 1.35* 1.16  < > 1.3 1.2 1.1  CALCIUM 8.6* 9.0 9.0 7.4*  --   --   --   --   MG 2.0 2.0 2.2  --   --   --   --   --   PHOS 3.8 3.7 3.8  --   --   --   --   --   < > = values in this interval not displayed. Liver Function Tests:  Recent Labs  10/23/15 1442 11/04/15 0416 11/05/15 0614 11/06/15 0515 12/30/15 04/11/16  AST 23  --   --   --  16 56*  ALT 17  --   --   --  16 74*  ALKPHOS 71  --   --   --  81 119  BILITOT 0.4  --   --   --   --   --   PROT 7.1  --   --   --   --   --  ALBUMIN 3.4* 2.2* 2.5* 2.4*  --   --     No results for input(s): LIPASE, AMYLASE in the last 8760 hours. No results for input(s): AMMONIA in the last 8760 hours. CBC:  Recent Labs  11/04/15 0416  11/05/15 0614  11/06/15 0515 11/07/15 0520 11/08/15 0526  12/10/15 12/30/15 04/11/16  WBC 12.5*  < > 15.8*  --  15.7* 12.5* 12.9*  < > 7.9 8.3 8.1  NEUTROABS 8.7*  --  12.1*  --  12.4*  --   --   --   --   --   --   HGB 6.5*  < > 9.4*  < > 9.3* 8.7* 9.0*  < > 10.9* 11.3* 14.1  HCT 20.3*  < > 28.6*  < > 28.4* 26.5* 28.5*  < > 34* 35* 44  MCV 83.5  < > 82.7  --  85.3 84.7 85.1  --   --   --   --   PLT 267  < > 303  --  322 372 424*  < > 282 289 204  < > = values in this interval not displayed. TSH: No results for input(s): TSH in the last 8760 hours. A1C: Lab Results  Component Value Date   HGBA1C 6.3 02/11/2016   Lipid Panel:  Recent Labs  10/24/15 0502 02/11/16  CHOL 138 158  HDL 43 46  LDLCALC 83 96  TRIG 59 80  CHOLHDL 3.2  --    Uric Acid 04/11/16: 5.4   Assessment/Plan  1. Mixed hyperlipidemia -Continue Lipitor 80mg  PO QD  -Will obtain lipid panel Adjustments as needed  2. Late effects of CVA (cerebrovascular accident) -Stable, conts on asa, with optimal BP, lipid and diabetic control. conts to follow up with neurology and rehab. Will follow up A1c, lipids and CMP   3. Essential hypertension -Stable; Blood pressure ranging between 101-111/62-87. Will cont current regimen of norvasc and atenolol. BP today=110/70   4. Percutaneous endoscopic gastrostomy status ([place]) -Stable, conts to get all nutrition via PEG -Weight 161 today, up from 154 07/11/16   Raesha Coonrod K. Harle Battiest  [facility] [phone] 8 am - 5 pm) [phone] (after hours)

## 2016-08-11 DIAGNOSIS — I69354 Hemiplegia and hemiparesis following cerebral infarction affecting left non-dominant side: Secondary | ICD-10-CM | POA: Diagnosis not present

## 2016-08-11 DIAGNOSIS — M6281 Muscle weakness (generalized): Secondary | ICD-10-CM | POA: Diagnosis not present

## 2016-08-11 DIAGNOSIS — M24542 Contracture, left hand: Secondary | ICD-10-CM | POA: Diagnosis not present

## 2016-08-12 DIAGNOSIS — M6281 Muscle weakness (generalized): Secondary | ICD-10-CM | POA: Diagnosis not present

## 2016-08-12 DIAGNOSIS — I69354 Hemiplegia and hemiparesis following cerebral infarction affecting left non-dominant side: Secondary | ICD-10-CM | POA: Diagnosis not present

## 2016-08-12 DIAGNOSIS — M24542 Contracture, left hand: Secondary | ICD-10-CM | POA: Diagnosis not present

## 2016-08-14 DIAGNOSIS — M6281 Muscle weakness (generalized): Secondary | ICD-10-CM | POA: Diagnosis not present

## 2016-08-14 DIAGNOSIS — M24542 Contracture, left hand: Secondary | ICD-10-CM | POA: Diagnosis not present

## 2016-08-14 DIAGNOSIS — I69354 Hemiplegia and hemiparesis following cerebral infarction affecting left non-dominant side: Secondary | ICD-10-CM | POA: Diagnosis not present

## 2016-08-15 DIAGNOSIS — M24542 Contracture, left hand: Secondary | ICD-10-CM | POA: Diagnosis not present

## 2016-08-15 DIAGNOSIS — I69354 Hemiplegia and hemiparesis following cerebral infarction affecting left non-dominant side: Secondary | ICD-10-CM | POA: Diagnosis not present

## 2016-08-15 DIAGNOSIS — M6281 Muscle weakness (generalized): Secondary | ICD-10-CM | POA: Diagnosis not present

## 2016-08-16 DIAGNOSIS — I69354 Hemiplegia and hemiparesis following cerebral infarction affecting left non-dominant side: Secondary | ICD-10-CM | POA: Diagnosis not present

## 2016-08-16 DIAGNOSIS — M6281 Muscle weakness (generalized): Secondary | ICD-10-CM | POA: Diagnosis not present

## 2016-08-16 DIAGNOSIS — M24542 Contracture, left hand: Secondary | ICD-10-CM | POA: Diagnosis not present

## 2016-08-17 DIAGNOSIS — I69354 Hemiplegia and hemiparesis following cerebral infarction affecting left non-dominant side: Secondary | ICD-10-CM | POA: Diagnosis not present

## 2016-08-17 DIAGNOSIS — M6281 Muscle weakness (generalized): Secondary | ICD-10-CM | POA: Diagnosis not present

## 2016-08-17 DIAGNOSIS — M24542 Contracture, left hand: Secondary | ICD-10-CM | POA: Diagnosis not present

## 2016-08-18 DIAGNOSIS — M24542 Contracture, left hand: Secondary | ICD-10-CM | POA: Diagnosis not present

## 2016-08-18 DIAGNOSIS — I69354 Hemiplegia and hemiparesis following cerebral infarction affecting left non-dominant side: Secondary | ICD-10-CM | POA: Diagnosis not present

## 2016-08-18 DIAGNOSIS — L853 Xerosis cutis: Secondary | ICD-10-CM | POA: Diagnosis not present

## 2016-08-18 DIAGNOSIS — E119 Type 2 diabetes mellitus without complications: Secondary | ICD-10-CM | POA: Diagnosis not present

## 2016-08-18 DIAGNOSIS — M6281 Muscle weakness (generalized): Secondary | ICD-10-CM | POA: Diagnosis not present

## 2016-08-21 DIAGNOSIS — I69354 Hemiplegia and hemiparesis following cerebral infarction affecting left non-dominant side: Secondary | ICD-10-CM | POA: Diagnosis not present

## 2016-08-21 DIAGNOSIS — M24542 Contracture, left hand: Secondary | ICD-10-CM | POA: Diagnosis not present

## 2016-08-21 DIAGNOSIS — M6281 Muscle weakness (generalized): Secondary | ICD-10-CM | POA: Diagnosis not present

## 2016-08-22 DIAGNOSIS — M6281 Muscle weakness (generalized): Secondary | ICD-10-CM | POA: Diagnosis not present

## 2016-08-22 DIAGNOSIS — M24542 Contracture, left hand: Secondary | ICD-10-CM | POA: Diagnosis not present

## 2016-08-22 DIAGNOSIS — I69354 Hemiplegia and hemiparesis following cerebral infarction affecting left non-dominant side: Secondary | ICD-10-CM | POA: Diagnosis not present

## 2016-08-29 ENCOUNTER — Encounter: Payer: Medicare Other | Attending: Physical Medicine & Rehabilitation

## 2016-08-29 ENCOUNTER — Encounter: Payer: Self-pay | Admitting: Physical Medicine & Rehabilitation

## 2016-08-29 ENCOUNTER — Ambulatory Visit (HOSPITAL_BASED_OUTPATIENT_CLINIC_OR_DEPARTMENT_OTHER): Payer: Medicare Other | Admitting: Physical Medicine & Rehabilitation

## 2016-08-29 VITALS — BP 124/70 | HR 66

## 2016-08-29 DIAGNOSIS — M24542 Contracture, left hand: Secondary | ICD-10-CM | POA: Diagnosis not present

## 2016-08-29 DIAGNOSIS — G8114 Spastic hemiplegia affecting left nondominant side: Secondary | ICD-10-CM | POA: Diagnosis not present

## 2016-08-29 DIAGNOSIS — M25512 Pain in left shoulder: Secondary | ICD-10-CM | POA: Insufficient documentation

## 2016-08-29 DIAGNOSIS — Z8673 Personal history of transient ischemic attack (TIA), and cerebral infarction without residual deficits: Secondary | ICD-10-CM | POA: Insufficient documentation

## 2016-08-29 DIAGNOSIS — I69354 Hemiplegia and hemiparesis following cerebral infarction affecting left non-dominant side: Secondary | ICD-10-CM | POA: Diagnosis not present

## 2016-08-29 DIAGNOSIS — M6281 Muscle weakness (generalized): Secondary | ICD-10-CM | POA: Diagnosis not present

## 2016-08-29 DIAGNOSIS — M7918 Myalgia, other site: Secondary | ICD-10-CM | POA: Insufficient documentation

## 2016-08-29 DIAGNOSIS — M47812 Spondylosis without myelopathy or radiculopathy, cervical region: Secondary | ICD-10-CM | POA: Insufficient documentation

## 2016-08-29 NOTE — Patient Instructions (Signed)

## 2016-08-29 NOTE — Progress Notes (Signed)
Botox Injection for spasticity using needle EMG guidance  Dilution: 50 Units/ml Indication: Severe spasticity which interferes with ADL,mobility and/or  hygiene and is unresponsive to medication management and other conservative care Informed consent was obtained after describing risks and benefits of the procedure with the patient. This includes bleeding, bruising, infection, excessive weakness, or medication side effects. A REMS form is on file and signed. Needle: 27g 1" needle electrode Number of units per muscle Left non dominant ext FCR50 FCU25 FDS50 FDP50 FPL25 All injections were done after obtaining appropriate EMG activity and after negative drawback for blood. The patient tolerated the procedure well. Post procedure instructions were given. A followup appointment was made.

## 2016-08-30 DIAGNOSIS — M24542 Contracture, left hand: Secondary | ICD-10-CM | POA: Diagnosis not present

## 2016-08-30 DIAGNOSIS — I69354 Hemiplegia and hemiparesis following cerebral infarction affecting left non-dominant side: Secondary | ICD-10-CM | POA: Diagnosis not present

## 2016-08-30 DIAGNOSIS — M6281 Muscle weakness (generalized): Secondary | ICD-10-CM | POA: Diagnosis not present

## 2016-08-31 DIAGNOSIS — I69354 Hemiplegia and hemiparesis following cerebral infarction affecting left non-dominant side: Secondary | ICD-10-CM | POA: Diagnosis not present

## 2016-08-31 DIAGNOSIS — M6281 Muscle weakness (generalized): Secondary | ICD-10-CM | POA: Diagnosis not present

## 2016-08-31 DIAGNOSIS — M24542 Contracture, left hand: Secondary | ICD-10-CM | POA: Diagnosis not present

## 2016-09-01 DIAGNOSIS — M6281 Muscle weakness (generalized): Secondary | ICD-10-CM | POA: Diagnosis not present

## 2016-09-01 DIAGNOSIS — M24542 Contracture, left hand: Secondary | ICD-10-CM | POA: Diagnosis not present

## 2016-09-01 DIAGNOSIS — I69354 Hemiplegia and hemiparesis following cerebral infarction affecting left non-dominant side: Secondary | ICD-10-CM | POA: Diagnosis not present

## 2016-09-04 DIAGNOSIS — M6281 Muscle weakness (generalized): Secondary | ICD-10-CM | POA: Diagnosis not present

## 2016-09-04 DIAGNOSIS — M24542 Contracture, left hand: Secondary | ICD-10-CM | POA: Diagnosis not present

## 2016-09-04 DIAGNOSIS — I69354 Hemiplegia and hemiparesis following cerebral infarction affecting left non-dominant side: Secondary | ICD-10-CM | POA: Diagnosis not present

## 2016-09-05 DIAGNOSIS — M24542 Contracture, left hand: Secondary | ICD-10-CM | POA: Diagnosis not present

## 2016-09-05 DIAGNOSIS — I69354 Hemiplegia and hemiparesis following cerebral infarction affecting left non-dominant side: Secondary | ICD-10-CM | POA: Diagnosis not present

## 2016-09-05 DIAGNOSIS — R296 Repeated falls: Secondary | ICD-10-CM | POA: Diagnosis not present

## 2016-09-06 DIAGNOSIS — D649 Anemia, unspecified: Secondary | ICD-10-CM | POA: Diagnosis not present

## 2016-09-06 DIAGNOSIS — R296 Repeated falls: Secondary | ICD-10-CM | POA: Diagnosis not present

## 2016-09-06 DIAGNOSIS — I69354 Hemiplegia and hemiparesis following cerebral infarction affecting left non-dominant side: Secondary | ICD-10-CM | POA: Diagnosis not present

## 2016-09-06 DIAGNOSIS — M24542 Contracture, left hand: Secondary | ICD-10-CM | POA: Diagnosis not present

## 2016-09-06 LAB — CBC AND DIFFERENTIAL
HEMATOCRIT: 42 % (ref 41–53)
HEMOGLOBIN: 13.5 g/dL (ref 13.5–17.5)
PLATELETS: 153 10*3/uL (ref 150–399)
WBC: 7.5 10^3/mL

## 2016-09-07 ENCOUNTER — Encounter: Payer: Self-pay | Admitting: *Deleted

## 2016-09-07 DIAGNOSIS — I69354 Hemiplegia and hemiparesis following cerebral infarction affecting left non-dominant side: Secondary | ICD-10-CM | POA: Diagnosis not present

## 2016-09-07 DIAGNOSIS — R296 Repeated falls: Secondary | ICD-10-CM | POA: Diagnosis not present

## 2016-09-07 DIAGNOSIS — M24542 Contracture, left hand: Secondary | ICD-10-CM | POA: Diagnosis not present

## 2016-09-08 DIAGNOSIS — R296 Repeated falls: Secondary | ICD-10-CM | POA: Diagnosis not present

## 2016-09-08 DIAGNOSIS — I69354 Hemiplegia and hemiparesis following cerebral infarction affecting left non-dominant side: Secondary | ICD-10-CM | POA: Diagnosis not present

## 2016-09-08 DIAGNOSIS — M24542 Contracture, left hand: Secondary | ICD-10-CM | POA: Diagnosis not present

## 2016-09-11 DIAGNOSIS — R296 Repeated falls: Secondary | ICD-10-CM | POA: Diagnosis not present

## 2016-09-11 DIAGNOSIS — M24542 Contracture, left hand: Secondary | ICD-10-CM | POA: Diagnosis not present

## 2016-09-11 DIAGNOSIS — I69354 Hemiplegia and hemiparesis following cerebral infarction affecting left non-dominant side: Secondary | ICD-10-CM | POA: Diagnosis not present

## 2016-09-13 ENCOUNTER — Encounter: Payer: Self-pay | Admitting: Nurse Practitioner

## 2016-09-13 ENCOUNTER — Non-Acute Institutional Stay (SKILLED_NURSING_FACILITY): Payer: Medicare Other | Admitting: Nurse Practitioner

## 2016-09-13 DIAGNOSIS — G8194 Hemiplegia, unspecified affecting left nondominant side: Secondary | ICD-10-CM | POA: Diagnosis not present

## 2016-09-13 DIAGNOSIS — E782 Mixed hyperlipidemia: Secondary | ICD-10-CM | POA: Diagnosis not present

## 2016-09-13 DIAGNOSIS — M109 Gout, unspecified: Secondary | ICD-10-CM

## 2016-09-13 DIAGNOSIS — E1121 Type 2 diabetes mellitus with diabetic nephropathy: Secondary | ICD-10-CM | POA: Diagnosis not present

## 2016-09-13 DIAGNOSIS — I10 Essential (primary) hypertension: Secondary | ICD-10-CM

## 2016-09-13 DIAGNOSIS — I2510 Atherosclerotic heart disease of native coronary artery without angina pectoris: Secondary | ICD-10-CM | POA: Diagnosis not present

## 2016-09-13 DIAGNOSIS — F323 Major depressive disorder, single episode, severe with psychotic features: Secondary | ICD-10-CM

## 2016-09-13 DIAGNOSIS — I69391 Dysphagia following cerebral infarction: Secondary | ICD-10-CM

## 2016-09-13 NOTE — Progress Notes (Signed)
Nursing Home Location:  [facility]  Place of Service: SNF (31)  PCP: [name], MD  No Known Allergies  Chief Complaint  Patient presents with  . Medical Management of Chronic Issues    Resident is being seen for a routine visit.     HPI:  Patient is a 66 y.o. male seen today at [facility] to follow up on chronic conditions. Pt with hx of CVA, arthrosclerosis, CAD, HTN, DM, hyperlipidemia. Pt with multiple stroke history due to atherosclerosis. Pt has been doing well in the last month. Staff conts to note cough at times, staff notes when giving feeding via PEG tube he will cough. Pt receives Protonix BID via tube.  No additional falls noted.  Mood and behaviors are unchanaged, staff repor  Review of Systems:  Review of Systems  Unable to perform ROS: Other    Past Medical History:  Diagnosis Date  . Diabetes mellitus without complication ([place])   . Dysarthria   . Dysphagia   . GI bleed   . Hyperlipidemia   . Hypertension   . Stroke [facility])    Past Surgical History:  Procedure Laterality Date  . ESOPHAGOGASTRODUODENOSCOPY (EGD) WITH PROPOFOL N/A 11/03/2015   Procedure: ESOPHAGOGASTRODUODENOSCOPY (EGD) WITH PROPOFOL;  Surgeon: Manus Gunning, MD;  Location: [place];  Service: Gastroenterology;  Laterality: N/A;   Social History:   reports that he has quit smoking. His smoking use included Cigarettes. He has a 16.00 pack-year smoking history. He has never used smokeless tobacco. He reports that he does not drink alcohol or use drugs.  Family History  Problem Relation Age of Onset  . Stroke Brother   . Diabetes Brother   . Stroke Brother   . Stroke Maternal Aunt     Medications: Patient's Medications  New Prescriptions   No medications on file  Previous Medications   ACETAMINOPHEN (TYLENOL) 650 MG CR TABLET    Take 650 mg by mouth every 4 (four) hours as needed for pain or fever.   ALLOPURINOL (ZYLOPRIM) 300 MG TABLET    Place  300 mg into feeding tube daily.    AMLODIPINE (NORVASC) 10 MG TABLET    Place 10 mg into feeding tube daily.   ASPIRIN EC 325 MG EC TABLET    Take 1 tablet (325 mg total) by mouth daily.   ATENOLOL (TENORMIN) 50 MG TABLET    Take 1 tablet (50 mg total) by mouth 2 (two) times daily.   ATORVASTATIN (LIPITOR) 80 MG TABLET    Place 80 mg into feeding tube daily.    CARBOXYMETHYLCELLULOSE (REFRESH PLUS) 0.5 % SOLN    Place 1 drop into both eyes 3 (three) times daily as needed (dry eyes).   CHOLECALCIFEROL (VITAMIN D) 1000 UNITS TABLET    1,000 Units daily.    DIVALPROEX (DEPAKOTE) 250 MG DR TABLET    Take 250 mg by mouth 2 (two) times daily.   ENALAPRIL (VASOTEC) 20 MG TABLET    Take 1 tablet (20 mg total) by mouth daily.   FOLIC ACID (FOLVITE) 1 MG TABLET    Place 1 mg into feeding tube daily.    NUTRITIONAL SUPPLEMENTS (ISOSOURCE 1.5 CAL) LIQD    Give 2 cans three times daily bolus via PEG tub with 30 ml flushes before and after feeding and medications. This will provide 2250 Kcal per day.   PANTOPRAZOLE SODIUM (PROTONIX) 40 MG/20 ML PACK    Place 20 mLs (40 mg total) into  feeding tube 2 (two) times daily.   QUETIAPINE (SEROQUEL) 25 MG TABLET    Take 25 mg by mouth 2 (two) times daily. 1200 and 1800  Modified Medications   No medications on file  Discontinued Medications   No medications on file     Physical Exam: Vitals:   09/13/16 1046  BP: 122/74  Pulse: 68  Resp: 20  Temp: 98.1 F (36.7 C)  SpO2: 98%  Weight: 167 lb 12.8 oz (76.1 kg)  Height: 5\' 9"  (1.753 m)    Physical Exam  Constitutional: He appears well-developed and well-nourished. No distress.  HENT:  Head: Normocephalic and atraumatic.  Mouth/Throat: Oropharynx is clear and moist. No oropharyngeal exudate.  Left sided facial droop  Eyes: Conjunctivae and EOM are normal. Pupils are equal, round, and reactive to light.  Neck: Normal range of motion. Neck supple.  Cardiovascular: Normal rate, regular rhythm and normal  heart sounds.   Pulmonary/Chest: Effort normal and breath sounds normal. No respiratory distress. He has no wheezes. He has no rales.  Abdominal: Soft. Bowel sounds are normal. He exhibits no distension. There is no tenderness. There is no guarding.  PEG tube intact   Musculoskeletal: He exhibits no edema or tenderness.  Decreased ROM in the left shoulder, WNL in right.  2/5 grip strength in left hand, 5/5 in right. Bilateral strength in lower extremities=4/5  Neurological: He is alert.  Left sided hemiplegia    Skin: Skin is warm and dry. He is not diaphoretic.  Psychiatric: He has a normal mood and affect.    Labs reviewed: Basic Metabolic Panel:  Recent Labs  11/04/15 0416 11/05/15 0614 11/06/15 0515 11/07/15 0520  12/10/15 12/30/15 04/11/16  NA 144 144 145 144  < > 141 140 140  K 4.0 3.7 3.7 4.3  < > 4.5 4.0 4.5  CL 115* 112* 113* 112*  --   --   --   --   CO2 22 24 24 27   --   --   --   --   GLUCOSE 99 101* 128* 143*  --   --   --   --   BUN 42* 28* 26* 24*  < > 40* 22* 35*  CREATININE 1.45* 1.26* 1.35* 1.16  < > 1.3 1.2 1.1  CALCIUM 8.6* 9.0 9.0 7.4*  --   --   --   --   MG 2.0 2.0 2.2  --   --   --   --   --   PHOS 3.8 3.7 3.8  --   --   --   --   --   < > = values in this interval not displayed. Liver Function Tests:  Recent Labs  10/23/15 1442 11/04/15 0416 11/05/15 0614 11/06/15 0515 12/30/15 04/11/16  AST 23  --   --   --  16 56*  ALT 17  --   --   --  16 74*  ALKPHOS 71  --   --   --  81 119  BILITOT 0.4  --   --   --   --   --   PROT 7.1  --   --   --   --   --   ALBUMIN 3.4* 2.2* 2.5* 2.4*  --   --    No results for input(s): LIPASE, AMYLASE in the last 8760 hours. No results for input(s): AMMONIA in the last 8760 hours. CBC:  Recent Labs  11/04/15 0416  11/05/15  0223  11/06/15 0515 11/07/15 0520 11/08/15 0526  12/30/15 04/11/16 09/06/16  WBC 12.5*  < > 15.8*  --  15.7* 12.5* 12.9*  < > 8.3 8.1 7.5  NEUTROABS 8.7*  --  12.1*  --  12.4*  --    --   --   --   --   --   HGB 6.5*  < > 9.4*  < > 9.3* 8.7* 9.0*  < > 11.3* 14.1 13.5  HCT 20.3*  < > 28.6*  < > 28.4* 26.5* 28.5*  < > 35* 44 42  MCV 83.5  < > 82.7  --  85.3 84.7 85.1  --   --   --   --   PLT 267  < > 303  --  322 372 424*  < > 289 204 153  < > = values in this interval not displayed. TSH: No results for input(s): TSH in the last 8760 hours. A1C: Lab Results  Component Value Date   HGBA1C 6.3 02/11/2016   Lipid Panel:  Recent Labs  10/24/15 0502 02/11/16  CHOL 138 158  HDL 43 46  LDLCALC 83 96  TRIG 59 80  CHOLHDL 3.2  --    Uric Acid 04/11/16: 5.4   Assessment/Plan 1. Coronary artery disease involving native heart without angina pectoris, unspecified vessel or lesion type Stable without chest pains, conts on ASA 325 mg daily  2. Dysphagia due to recent stroke NPO status, all nutrition and medication via PEG tube at this time. Suspect reflux causing cough. Staff has tried to get protonix changed from sprinkles to liquid via pharmacy due to sprinkles coming out whole in bowels. Will write order to change to protonix 40 mg elixer  BID   3. Essential hypertension -stable on current regimen. Will cont atenolol, norvasc, and enalapril   4. Depressive psychosis ([place]) Ongoing depressive symptoms at times per staff. Likes to stay in bed a lot. Being followed by psych. Currently on seroquel and depakote   5. Gout, unspecified cause, unspecified chronicity, unspecified site Uric acid level at goal in december, no acute flares, conts on allopurinol   6. Mixed hyperlipidemia Will follow up lipid panel; conts on lipitor 80 mg daily   7. Left hemiparesis ([facility]) Stable, conts on ASA 325 mg daily, no progression of symptoms, conts to follow up with neurology for botulinum toxin injections   8. Type 2 diabetes mellitus with diabetic nephropathy, without long-term current use of insulin ([place]) Not currently on medication, will follow up A1c  Rayburn Mundis K. Harle Battiest  [facility] [phone] 8 am - 5 pm) [phone] (after hours)

## 2016-09-14 DIAGNOSIS — I10 Essential (primary) hypertension: Secondary | ICD-10-CM | POA: Diagnosis not present

## 2016-09-14 DIAGNOSIS — F419 Anxiety disorder, unspecified: Secondary | ICD-10-CM | POA: Diagnosis not present

## 2016-09-14 DIAGNOSIS — E119 Type 2 diabetes mellitus without complications: Secondary | ICD-10-CM | POA: Diagnosis not present

## 2016-09-14 DIAGNOSIS — E785 Hyperlipidemia, unspecified: Secondary | ICD-10-CM | POA: Diagnosis not present

## 2016-09-14 DIAGNOSIS — I69391 Dysphagia following cerebral infarction: Secondary | ICD-10-CM | POA: Diagnosis not present

## 2016-09-14 LAB — BASIC METABOLIC PANEL
BUN: 55 mg/dL — AB (ref 4–21)
Creatinine: 1.7 mg/dL — AB (ref 0.6–1.3)
Glucose: 125 mg/dL
POTASSIUM: 4.6 mmol/L (ref 3.4–5.3)
SODIUM: 138 mmol/L (ref 137–147)

## 2016-09-14 LAB — LIPID PANEL
CHOLESTEROL: 94 mg/dL (ref 0–200)
HDL: 30 mg/dL — AB (ref 35–70)
LDL CALC: 48 mg/dL
Triglycerides: 81 mg/dL (ref 40–160)

## 2016-09-14 LAB — HEMOGLOBIN A1C: HEMOGLOBIN A1C: 5.9

## 2016-09-15 LAB — FECAL OCCULT BLOOD, GUAIAC: Fecal Occult Blood: POSITIVE

## 2016-09-20 DIAGNOSIS — D649 Anemia, unspecified: Secondary | ICD-10-CM | POA: Diagnosis not present

## 2016-09-20 DIAGNOSIS — K922 Gastrointestinal hemorrhage, unspecified: Secondary | ICD-10-CM | POA: Diagnosis not present

## 2016-09-20 DIAGNOSIS — I69354 Hemiplegia and hemiparesis following cerebral infarction affecting left non-dominant side: Secondary | ICD-10-CM | POA: Diagnosis not present

## 2016-09-20 LAB — CBC AND DIFFERENTIAL
HEMATOCRIT: 39 % — AB (ref 41–53)
HEMOGLOBIN: 13.1 g/dL — AB (ref 13.5–17.5)
Platelets: 157 10*3/uL (ref 150–399)
WBC: 7.1 10^3/mL

## 2016-09-21 ENCOUNTER — Encounter: Payer: Self-pay | Admitting: Internal Medicine

## 2016-09-21 ENCOUNTER — Non-Acute Institutional Stay (SKILLED_NURSING_FACILITY): Payer: Medicare Other | Admitting: Internal Medicine

## 2016-09-21 DIAGNOSIS — N289 Disorder of kidney and ureter, unspecified: Secondary | ICD-10-CM

## 2016-09-21 DIAGNOSIS — K922 Gastrointestinal hemorrhage, unspecified: Secondary | ICD-10-CM | POA: Diagnosis not present

## 2016-09-21 DIAGNOSIS — R195 Other fecal abnormalities: Secondary | ICD-10-CM

## 2016-09-21 NOTE — Assessment & Plan Note (Signed)
Monitor CBC to determine aggressiveness of any GI evaluation

## 2016-09-21 NOTE — Progress Notes (Signed)
   [facility] Room: 105A  This is a nursing facility follow up for specific acute issue of positive FOB X 3.  Interim medical record and care since last [place] visit was updated with review of diagnostic studies and change in clinical status since last visit were documented.  HPI: Hemoccults were performed as a colon cancer screen. The patient is not a candidate for [place] [employer] as he has had a history of upper GI bleeding. He was hospitalized 6/17-11/08/15 with hematemesis and bloody diarrhea. Aspirin & Plavix were discontinued. On  11/03/15 he had active gastric bleeding and blood clots found at endoscopy. Tightening of the G-tube resolved active bleeding. CBC performed 5/16 is pending. On 5/10 BUN was 55 & creat 1.7. The patient has expressive dysphasia and is unable to give an adequate history. He responds with unintelligible grunts and growls. He will also shake his head yes or no. He initially indicated that he had some epistaxis as well as some dysphagia but denied the latter on requestioning.  Review of systems:  hemoptysis, hematuria, melena, or rectal bleeding denied. No unexplained weight loss, significant dyspepsia, or abdominal pain.  There is no abnormal bruising , bleeding, or difficulty stopping bleeding with injury.  Physical exam:  Pertinent or positive findings:He sits in the wheelchair with a blank stare. He was holding a towel in his mouth. The lower lip is everted. A dense cataract noted on the left. He has severe plaque formation of the teeth. Breath sounds are decreased. Heart sounds are irregular and distant.  PEG tube is in place. He has paresis of left upper extremity & decreased range of motion of the left lower extremity. The left lower extremity is in a brace. Pedal pulses are decreased   General appearance:Thin but adequately nourished; no acute distress , increased work of breathing is present.   Lymphatic: No lymphadenopathy about  the head, neck, axilla . Eyes: No conjunctival inflammation or lid edema is present. There is no scleral icterus. Ears:  External ear exam shows no significant lesions or deformities.   Nose:  External nasal examination shows no deformity or inflammation. Nasal mucosa are pink and moist without lesions ,exudates Oral exam: lips and gums are healthy appearing.There is no oropharyngeal erythema or exudate . Neck:  No thyromegaly, masses, tenderness noted.    Heart:  Normal rate and regular rhythm. S1 and S2 normal without gallop, murmur, click, rub .  Lungs:Chest clear to auscultation without wheezes, rhonchi,rales , rubs. Abdomen:Bowel sounds are normal. Abdomen is soft and nontender with no organomegaly, hernias,masses. GU: deferred  Extremities:  No cyanosis, clubbing,edema  Neurologic exam : Cn 2-7 intact Strength equal  in upper & lower extremities Balance,Rhomberg,finger to nose testing could not be completed due to clinical state Deep tendon reflexes are equal Skin: Warm & dry w/o tenting. No significant lesions or rash.   #1 positive fecal occult blood 3 in the context of past history of upper GI bleed related to G-tube issue. As he is high risk ColoGuard is not an option. Plan: As he has multiple advanced co-morbitities &  is unable to provide adequate history;CBC monitor will dictate how aggressively  GI evaluation is pursued

## 2016-09-22 ENCOUNTER — Encounter: Payer: Self-pay | Admitting: Internal Medicine

## 2016-09-22 DIAGNOSIS — N289 Disorder of kidney and ureter, unspecified: Secondary | ICD-10-CM

## 2016-09-22 HISTORY — DX: Disorder of kidney and ureter, unspecified: N28.9

## 2016-09-22 NOTE — Patient Instructions (Signed)
See assessment and plan under each diagnosis acutely for this visit  

## 2016-09-22 NOTE — Assessment & Plan Note (Signed)
Decrease Allopurinol to 150 mg qd

## 2016-09-27 DIAGNOSIS — H25812 Combined forms of age-related cataract, left eye: Secondary | ICD-10-CM | POA: Diagnosis not present

## 2016-09-27 DIAGNOSIS — Z961 Presence of intraocular lens: Secondary | ICD-10-CM | POA: Diagnosis not present

## 2016-09-27 DIAGNOSIS — H2512 Age-related nuclear cataract, left eye: Secondary | ICD-10-CM | POA: Diagnosis not present

## 2016-09-28 ENCOUNTER — Encounter: Payer: Self-pay | Admitting: *Deleted

## 2016-09-28 ENCOUNTER — Telehealth: Payer: Self-pay

## 2016-09-28 DIAGNOSIS — E119 Type 2 diabetes mellitus without complications: Secondary | ICD-10-CM | POA: Diagnosis not present

## 2016-09-28 DIAGNOSIS — I69354 Hemiplegia and hemiparesis following cerebral infarction affecting left non-dominant side: Secondary | ICD-10-CM | POA: Diagnosis not present

## 2016-09-28 DIAGNOSIS — R922 Inconclusive mammogram: Secondary | ICD-10-CM | POA: Diagnosis not present

## 2016-09-28 DIAGNOSIS — D649 Anemia, unspecified: Secondary | ICD-10-CM | POA: Diagnosis not present

## 2016-09-28 LAB — CBC AND DIFFERENTIAL
HEMATOCRIT: 40 % — AB (ref 41–53)
Hemoglobin: 13 g/dL — AB (ref 13.5–17.5)
Platelets: 165 10*3/uL (ref 150–399)
WBC: 9.5 10^3/mL

## 2016-09-28 LAB — BASIC METABOLIC PANEL
BUN: 36 mg/dL — AB (ref 4–21)
Creatinine: 1.2 mg/dL (ref 0.6–1.3)
GLUCOSE: 135 mg/dL
POTASSIUM: 4.5 mmol/L (ref 3.4–5.3)
SODIUM: 140 mmol/L (ref 137–147)

## 2016-09-28 NOTE — Telephone Encounter (Signed)
Form placed on Dr. Erlinda Hong desk. Pt last seen 05/2016, has appt 11/2016. Form on desk to see if Dr. Erlinda Hong can fill out. The papers is for permanent regular aid form the veterans affairs.

## 2016-10-04 DIAGNOSIS — M24542 Contracture, left hand: Secondary | ICD-10-CM | POA: Diagnosis not present

## 2016-10-04 DIAGNOSIS — I69354 Hemiplegia and hemiparesis following cerebral infarction affecting left non-dominant side: Secondary | ICD-10-CM | POA: Diagnosis not present

## 2016-10-04 DIAGNOSIS — R296 Repeated falls: Secondary | ICD-10-CM | POA: Diagnosis not present

## 2016-10-05 NOTE — Telephone Encounter (Signed)
Return patients brother phone call [place]. Rn receive message that he left message on the infusion vm. Rn left another message that form fee is 50.00 and is in medical records and needs to be paid. Also there is another form on nurse desk that Dr. Erlinda Hong needs clarity on.

## 2016-10-05 NOTE — Telephone Encounter (Signed)
LEft vm for patients brother [facility] at [phone]. Rn stated  The department of veterans form for regular aid is fill out. There is a fee for the form 50.00 dollars.Sent to medical records for payment.  The other form is not clear to Dr.Xu. He would like clarification on how to proceed with the form and what information needs to be on it.

## 2016-10-05 NOTE — Telephone Encounter (Signed)
Receive call from phone room. PTs brother was returning the phone call. Rn stated the other form was done. Rn stated another form wanted Dr. Erlinda Hong to check how much disability would he recommend. Rn stated to pts [place] will have to determine that. Rn stated an office note can be attach and the pts diagnoses and limitations, and what he needs will be on the form. Rn stated Dr. Erlinda Hong will write he needs 24 hour care, has PEG, cannot walk, left side is weak, wheelchair bound, and other deficits. PTs brother that will be okay. PT is still in nursing home,and they are trying to get more financial help to pay. PTs brother is aware of the 50.00 dollars fee for payment.

## 2016-10-09 ENCOUNTER — Telehealth: Payer: Self-pay

## 2016-10-09 DIAGNOSIS — H21562 Pupillary abnormality, left eye: Secondary | ICD-10-CM | POA: Diagnosis not present

## 2016-10-09 DIAGNOSIS — H25812 Combined forms of age-related cataract, left eye: Secondary | ICD-10-CM | POA: Diagnosis not present

## 2016-10-09 DIAGNOSIS — H2512 Age-related nuclear cataract, left eye: Secondary | ICD-10-CM | POA: Diagnosis not present

## 2016-10-09 NOTE — Telephone Encounter (Signed)
LEft vm for patient to see if he came to pick up the [place] forms to get more assistance while at the nursing home.

## 2016-10-09 NOTE — Telephone Encounter (Signed)
LEft vm for patients brother that a fee of 50.00 dollars for the forms. Dr. Erlinda Hong fill out two forms from the [place] so pt can get assistance. Pts brother was aware of the fee for the form. Rn spoke with Hinton Dyer and the pt did not pay.

## 2016-10-10 ENCOUNTER — Ambulatory Visit: Payer: Medicare Other | Admitting: Physical Medicine & Rehabilitation

## 2016-10-11 DIAGNOSIS — Z0289 Encounter for other administrative examinations: Secondary | ICD-10-CM

## 2016-10-13 ENCOUNTER — Encounter: Payer: Self-pay | Admitting: Physical Medicine & Rehabilitation

## 2016-10-13 ENCOUNTER — Ambulatory Visit (HOSPITAL_BASED_OUTPATIENT_CLINIC_OR_DEPARTMENT_OTHER): Payer: Medicare Other | Admitting: Physical Medicine & Rehabilitation

## 2016-10-13 ENCOUNTER — Encounter: Payer: Medicare Other | Attending: Physical Medicine & Rehabilitation

## 2016-10-13 VITALS — BP 134/77 | HR 53 | Resp 14

## 2016-10-13 DIAGNOSIS — M25512 Pain in left shoulder: Secondary | ICD-10-CM | POA: Diagnosis not present

## 2016-10-13 DIAGNOSIS — G8114 Spastic hemiplegia affecting left nondominant side: Secondary | ICD-10-CM | POA: Diagnosis not present

## 2016-10-13 DIAGNOSIS — F39 Unspecified mood [affective] disorder: Secondary | ICD-10-CM | POA: Diagnosis not present

## 2016-10-13 DIAGNOSIS — Z8673 Personal history of transient ischemic attack (TIA), and cerebral infarction without residual deficits: Secondary | ICD-10-CM | POA: Insufficient documentation

## 2016-10-13 DIAGNOSIS — G8194 Hemiplegia, unspecified affecting left nondominant side: Secondary | ICD-10-CM

## 2016-10-13 DIAGNOSIS — I63311 Cerebral infarction due to thrombosis of right middle cerebral artery: Secondary | ICD-10-CM | POA: Diagnosis not present

## 2016-10-13 DIAGNOSIS — R569 Unspecified convulsions: Secondary | ICD-10-CM | POA: Diagnosis not present

## 2016-10-13 NOTE — Patient Instructions (Signed)
PLEASE ASK STAFF TO APPLY LEFT RESTING HAND SPLINT AT NIGHT

## 2016-10-13 NOTE — Progress Notes (Signed)
Subjective:    Patient ID: Sean Collins, male    DOB: Dec 05, 1950, 66 y.o.   MRN: [id number]  HPI 39 old male with right basal ganglia infarct resulting in left spastic hemiplegia. He resides in a skilled nursing facility. He is accompanied by his brother. He is following up after Botox injection, probably 6 weeks ago with the following muscles were treated  [place]  His mother feels he has good results. The patient is severely dysarthric and also has difficulty writing. Pain Inventory Average Pain 2 Pain Right Now 2 My pain is .  In the last 24 hours, has pain interfered with the following? General activity 2 Relation with others 2 Enjoyment of life 2 What TIME of day is your pain at its worst? night Sleep (in general) Good  Pain is worse with: some activites Pain improves with: rest Relief from Meds: .  Mobility ability to climb steps?  no do you drive?  no use a wheelchair needs help with transfers  Function disabled: date disabled .  Neuro/Psych trouble walking  Prior Studies Any changes since last visit?  no  Physicians involved in your care Any changes since last visit?  no   Family History  Problem Relation Age of Onset  . Stroke Brother   . Diabetes Brother   . Stroke Brother   . Stroke Maternal Aunt    Social History   Social History  . Marital status: Legally Separated    Spouse name: N/A  . Number of children: N/A  . Years of education: N/A   Social History Main Topics  . Smoking status: Former Smoker    Packs/day: 0.50    Years: 32.00    Types: Cigarettes  . Smokeless tobacco: Never Used     Comment: Started at age 14 though quit for 10 years at one point  . Alcohol use No  . Drug use: No  . Sexual activity: Not Currently   Other Topics Concern  . 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 ESOPHAGOGASTRODUODENOSCOPY (EGD) WITH PROPOFOL N/A 11/03/2015   Procedure: ESOPHAGOGASTRODUODENOSCOPY (EGD) WITH PROPOFOL;  Surgeon: Manus Gunning, MD;  Location: [place];  Service: Gastroenterology;  Laterality: N/A;   Past Medical History:  Diagnosis Date  . Diabetes mellitus without complication ([place])   . Dysarthria   . Dysphagia   . GI bleed   . Hyperlipidemia   . Hypertension   . Renal insufficiency 09/22/2016  . Stroke ([place])    BP 134/77 (BP Location: Right Arm, Patient Position: Sitting, Cuff Size: Normal)   Pulse (!) 53   Resp 14   SpO2 95%   Opioid Risk Score:   Fall Risk Score:  `1  Depression screen PHQ 2/9  Depression screen PHQ 2/9 02/07/2016  Decreased Interest 0  Down, Depressed, Hopeless 0  PHQ - 2 Score 0  Altered sleeping 0  Tired, decreased energy 0  Change in appetite 0  Feeling bad or failure about yourself  0  Trouble concentrating 0  Moving slowly or fidgety/restless 3  Suicidal thoughts 0  PHQ-9 Score 3    Review of Systems  Constitutional: Negative.   HENT: Negative.   Eyes: Negative.   Respiratory: Negative.  Cardiovascular: Negative.   Gastrointestinal: Negative.   Endocrine: Negative.   Genitourinary: Negative.   Musculoskeletal: Positive for gait problem.  Skin: Negative.   Allergic/Immunologic: Negative.   Hematological: Negative.   Psychiatric/Behavioral: Negative.   All other systems reviewed and are negative.      Objective:   Physical Exam  Constitutional: He appears well-developed and well-nourished.  HENT:  Sialorrhea  Eyes: Conjunctivae and EOM are normal. Pupils are equal, round, and reactive to light.  Nursing note and vitals reviewed.  motor strength is 0/5 in the left upper limb.  Left upper extremity tone Tone pectoralis Ashworth 3 Biceps Ashworth 1 Finger flexors, Ashworth 1 Wrist flexors, Ashworth 1 Thumb flexor,  Ashworth 1        Assessment & Plan:  1. Left spastic hemiplegia secondary to right basal ganglia infarct. Good response to botulinum toxin injections. 6 weeks post. He does have increased tone in the pectoralis, which has not been treated thus far. Will add 100 units of Botox to treat the left pectoralis.  FCR50 FCU25 FDS50 FDP50 FPL25 Pectoralis 100

## 2016-10-18 ENCOUNTER — Encounter: Payer: Self-pay | Admitting: Adult Health

## 2016-10-18 ENCOUNTER — Non-Acute Institutional Stay (SKILLED_NURSING_FACILITY): Payer: Medicare Other | Admitting: Adult Health

## 2016-10-18 DIAGNOSIS — E782 Mixed hyperlipidemia: Secondary | ICD-10-CM | POA: Diagnosis not present

## 2016-10-18 DIAGNOSIS — M109 Gout, unspecified: Secondary | ICD-10-CM

## 2016-10-18 DIAGNOSIS — F39 Unspecified mood [affective] disorder: Secondary | ICD-10-CM

## 2016-10-18 DIAGNOSIS — I10 Essential (primary) hypertension: Secondary | ICD-10-CM

## 2016-10-18 DIAGNOSIS — G8114 Spastic hemiplegia affecting left nondominant side: Secondary | ICD-10-CM

## 2016-10-18 DIAGNOSIS — R1310 Dysphagia, unspecified: Secondary | ICD-10-CM | POA: Diagnosis not present

## 2016-10-18 DIAGNOSIS — Z8673 Personal history of transient ischemic attack (TIA), and cerebral infarction without residual deficits: Secondary | ICD-10-CM | POA: Diagnosis not present

## 2016-10-18 DIAGNOSIS — F323 Major depressive disorder, single episode, severe with psychotic features: Secondary | ICD-10-CM

## 2016-10-18 DIAGNOSIS — Z8719 Personal history of other diseases of the digestive system: Secondary | ICD-10-CM

## 2016-10-18 NOTE — Progress Notes (Signed)
DATE:  10/18/2016   MRN:  [id number]  BIRTHDAY: [birth year]  Facility:  Nursing Home Location:  [facility] [place] Room Number: 105-A  LEVEL OF CARE:  SNF (31)  Contact Information    Name Relation Home Work Mobile   [place] Brother [phone]         Code Status History    Date Active Date Inactive Code Status Order ID Comments User Context   10/23/2015  5:13 PM 11/08/2015  9:08 PM Full Code [id number]  Zada Finders, MD Inpatient   01/22/2015  9:01 PM 01/26/2015  2:45 PM Full Code [id number]  Lily Kocher, MD Inpatient       Chief Complaint  Patient presents with  . Medical Management of Chronic Issues    Routine    HISTORY OF PRESENT ILLNESS:  This is a 21-YO male seen for a routine visit.  He is a long-term care resident at [facility] and [place]. He has PMH of CVA, arthrosclerosis, CAD, hypertension, diabetes mellitus and hyperlipidemia. He was recently treated for his left spastic hemiplegia secondary to basal ganglia infarct with botox injection. Omeprazole was recently changed to Ranitidine per pharmacy recommendations.    PAST MEDICAL HISTORY:  Past Medical History:  Diagnosis Date  . Diabetes mellitus without complication ([place])   . Dysarthria   . Dysphagia   . GI bleed   . Hyperlipidemia   . Hypertension   . Renal insufficiency 09/22/2016  . Stroke [facility])      CURRENT MEDICATIONS: Reviewed  Patient's Medications  New Prescriptions   No medications on file  Previous Medications   ACETAMINOPHEN (TYLENOL) 650 MG CR TABLET    Take 650 mg by mouth every 6 (six) hours as needed for pain or fever.    ALLOPURINOL (ZYLOPRIM) 150 MG TABS TABLET    Place 150 mg into feeding tube daily.   AMLODIPINE (NORVASC) 10 MG TABLET    Place 10 mg into feeding tube daily.   ASPIRIN 325 MG TABLET    Place 325 mg into feeding tube daily.   ATENOLOL (TENORMIN) 50 MG TABLET    Place 50 mg into feeding tube 2 (two) times daily.   ATENOLOL (TENORMIN)  50 MG TABLET    Place 50 mg into feeding tube 2 (two) times daily.   ATORVASTATIN (LIPITOR) 80 MG TABLET    Place 80 mg into feeding tube daily.    CARBOXYMETHYLCELLULOSE (REFRESH PLUS) 0.5 % SOLN    Place 1 drop into both eyes 3 (three) times daily as needed (dry eyes).   CHOLECALCIFEROL (VITAMIN D) 1000 UNITS TABLET    1,000 Units daily. Give via PEG   DIVALPROEX (DEPAKOTE) 250 MG DR TABLET    Take 250 mg by mouth 2 (two) times daily.   ENALAPRIL (VASOTEC) 20 MG TABLET    Take 1 tablet (20 mg total) by mouth daily.   FOLIC ACID (FOLVITE) 1 MG TABLET    Place 1 mg into feeding tube daily.    KETOROLAC (ACULAR) 0.4 % SOLN    Place 1 drop into the left eye 4 (four) times daily.   NUTRITIONAL SUPPLEMENTS (ISOSOURCE 1.5 CAL) LIQD    Give 2 cans three times daily bolus via PEG tub with 30 ml flushes before and after feeding and medications. This will provide 2250 Kcal per day.   PREDNISOLONE ACETATE (PRED FORTE) 1 % OPHTHALMIC SUSPENSION    Place 1 drop into the left eye 4 (four) times daily.  QUETIAPINE (SEROQUEL) 25 MG TABLET    Take 25 mg by mouth 2 (two) times daily. 1200 and 1800   RANITIDINE (ZANTAC) 15 MG/ML SYRUP    Place into feeding tube 2 (two) times daily. Take 5 mL q12h  Modified Medications   No medications on file  Discontinued Medications   ALLOPURINOL (ZYLOPRIM) 300 MG TABLET    Place 300 mg into feeding tube daily.    ASPIRIN EC 325 MG EC TABLET    Take 1 tablet (325 mg total) by mouth daily.   ATENOLOL (TENORMIN) 50 MG TABLET    Take 1 tablet (50 mg total) by mouth 2 (two) times daily.   PANTOPRAZOLE SODIUM (PROTONIX) 40 MG/20 ML PACK    Place 20 mLs (40 mg total) into feeding tube 2 (two) times daily.     No Known Allergies   REVIEW OF SYSTEMS:  Unable to obtain due to dysaarthria    PHYSICAL EXAMINATION  GENERAL APPEARANCE:  In no acute distress. Normal body habitus SKIN:  Skin is warm and dry.  HEAD: Normal in size and contour. No evidence of trauma EYES: Lids  open and close normally. No blepharitis, entropion or ectropion.  Conjunctivae are clear and sclerae are white. Lenses are without opacity EARS: Pinnae are normal. Patient hears normal voice tunes of the examiner MOUTH and THROAT: Lips are without lesions. Oral mucosa is moist and without lesions. Tongue is normal in shape, size, and color and without lesions NECK: supple, trachea midline, no neck masses, no thyroid tenderness, no thyromegaly LYMPHATICS: no LAN in the neck, no supraclavicular LAN RESPIRATORY: breathing is even & unlabored, BS CTAB CARDIAC: RRR, no murmur,no extra heart sounds, no edema GI: abdomen soft, normal BS, no masses, no tenderness, no hepatomegaly, no splenomegaly, peg tube on LUQ EXTREMITIES:  Left hemiplegia, able to move RUE and RLE NEURO:  Left hemiplegia, dysarthria PSYCHIATRIC:  Affect and behavior are appropriate   LABS/RADIOLOGY: Labs reviewed: Basic Metabolic Panel:  Recent Labs  11/04/15 0416 11/05/15 0614 11/06/15 0515 11/07/15 0520  04/11/16 09/14/16 09/28/16  NA 144 144 145 144  < > 140 138 140  K 4.0 3.7 3.7 4.3  < > 4.5 4.6 4.5  CL 115* 112* 113* 112*  --   --   --   --   CO2 22 24 24 27   --   --   --   --   GLUCOSE 99 101* 128* 143*  --   --   --   --   BUN 42* 28* 26* 24*  < > 35* 55* 36*  CREATININE 1.45* 1.26* 1.35* 1.16  < > 1.1 1.7* 1.2  CALCIUM 8.6* 9.0 9.0 7.4*  --   --   --   --   MG 2.0 2.0 2.2  --   --   --   --   --   PHOS 3.8 3.7 3.8  --   --   --   --   --   < > = values in this interval not displayed. Liver Function Tests:  Recent Labs  10/23/15 1442 11/04/15 0416 11/05/15 0614 11/06/15 0515 12/30/15 04/11/16  AST 23  --   --   --  16 56*  ALT 17  --   --   --  16 74*  ALKPHOS 71  --   --   --  81 119  BILITOT 0.4  --   --   --   --   --  PROT 7.1  --   --   --   --   --   ALBUMIN 3.4* 2.2* 2.5* 2.4*  --   --    CBC:  Recent Labs  11/04/15 0416  11/05/15 0614  11/06/15 0515 11/07/15 0520 11/08/15 0526   09/06/16 09/20/16 09/28/16  WBC 12.5*  < > 15.8*  --  15.7* 12.5* 12.9*  < > 7.5 7.1 9.5  NEUTROABS 8.7*  --  12.1*  --  12.4*  --   --   --   --   --   --   HGB 6.5*  < > 9.4*  < > 9.3* 8.7* 9.0*  < > 13.5 13.1* 13.0*  HCT 20.3*  < > 28.6*  < > 28.4* 26.5* 28.5*  < > 42 39* 40*  MCV 83.5  < > 82.7  --  85.3 84.7 85.1  --   --   --   --   PLT 267  < > 303  --  322 372 424*  < > 153 157 165  < > = values in this interval not displayed. Lipid Panel:  Recent Labs  10/24/15 0502 02/11/16 09/14/16  HDL 43 46 30*   CBG:  Recent Labs  11/08/15 0736 11/08/15 1136 11/08/15 1735  GLUCAP 114* 125* 100*     ASSESSMENT/PLAN:  1. Essential hypertension -  Well-controlled; continue amlodipine 10 mg 1 tab Q D, Atenolol 50 mg BID   and enalapril 20 mg 1 tab daily   2. Left spastic hemiplegia (HCC) - S/P botox injection on 10/13/16; continue supportive care; fall precautions   3. Gout, unspecified cause, unspecified chronicity, unspecified site - continue allopurinol 150 mg daily   4. Mixed hyperlipidemia -  Continue Lipitor 80 mg 1 tab Q HS   5. Mood disorder (HCC) -  Continued Depakote 125 mg 2 capsules =  250 mg BID   6. History of CVA (cerebrovascular accident) -  Continue Aspirin 325 mg Q D,  Lipitor 80 mg daily at bedtime,  Amlodipine 10 mg daily, Atenolol 50 mg BID and enalapril 20 mg daily   7. Dysphagia, unspecified type - currently NPO, continue Isosource 1.5 2 cans TID     8. Depressive psychosis (HCC) -  Continue Quetiapine 25 mg 1 tab twice a day   9. H/O: upper GI bleed - no reported GI bleed,  Continue ranitidine 15 mg/ML give 5 mL every 12 hours    Goals of care:  Long-term care   Tyra Michelle C. Destin - NP    [employer] [phone]

## 2016-10-25 DIAGNOSIS — F39 Unspecified mood [affective] disorder: Secondary | ICD-10-CM | POA: Diagnosis not present

## 2016-10-25 DIAGNOSIS — F419 Anxiety disorder, unspecified: Secondary | ICD-10-CM | POA: Diagnosis not present

## 2016-10-25 DIAGNOSIS — F339 Major depressive disorder, recurrent, unspecified: Secondary | ICD-10-CM | POA: Diagnosis not present

## 2016-10-25 DIAGNOSIS — G3184 Mild cognitive impairment, so stated: Secondary | ICD-10-CM | POA: Diagnosis not present

## 2016-11-01 DIAGNOSIS — I69322 Dysarthria following cerebral infarction: Secondary | ICD-10-CM | POA: Diagnosis not present

## 2016-11-03 DIAGNOSIS — I69322 Dysarthria following cerebral infarction: Secondary | ICD-10-CM | POA: Diagnosis not present

## 2016-11-04 DIAGNOSIS — I69322 Dysarthria following cerebral infarction: Secondary | ICD-10-CM | POA: Diagnosis not present

## 2016-11-06 DIAGNOSIS — I69322 Dysarthria following cerebral infarction: Secondary | ICD-10-CM | POA: Diagnosis not present

## 2016-11-07 DIAGNOSIS — I69322 Dysarthria following cerebral infarction: Secondary | ICD-10-CM | POA: Diagnosis not present

## 2016-11-08 DIAGNOSIS — I69322 Dysarthria following cerebral infarction: Secondary | ICD-10-CM | POA: Diagnosis not present

## 2016-11-10 ENCOUNTER — Non-Acute Institutional Stay (SKILLED_NURSING_FACILITY): Payer: Medicare Other | Admitting: Adult Health

## 2016-11-10 ENCOUNTER — Encounter: Payer: Self-pay | Admitting: Adult Health

## 2016-11-10 DIAGNOSIS — I10 Essential (primary) hypertension: Secondary | ICD-10-CM | POA: Diagnosis not present

## 2016-11-10 DIAGNOSIS — I69322 Dysarthria following cerebral infarction: Secondary | ICD-10-CM | POA: Diagnosis not present

## 2016-11-10 DIAGNOSIS — R1310 Dysphagia, unspecified: Secondary | ICD-10-CM | POA: Diagnosis not present

## 2016-11-10 DIAGNOSIS — Z8719 Personal history of other diseases of the digestive system: Secondary | ICD-10-CM

## 2016-11-10 DIAGNOSIS — F0151 Vascular dementia with behavioral disturbance: Secondary | ICD-10-CM

## 2016-11-10 DIAGNOSIS — F323 Major depressive disorder, single episode, severe with psychotic features: Secondary | ICD-10-CM

## 2016-11-10 DIAGNOSIS — F01518 Vascular dementia, unspecified severity, with other behavioral disturbance: Secondary | ICD-10-CM

## 2016-11-10 DIAGNOSIS — F39 Unspecified mood [affective] disorder: Secondary | ICD-10-CM | POA: Diagnosis not present

## 2016-11-10 DIAGNOSIS — G8114 Spastic hemiplegia affecting left nondominant side: Secondary | ICD-10-CM | POA: Diagnosis not present

## 2016-11-10 DIAGNOSIS — E782 Mixed hyperlipidemia: Secondary | ICD-10-CM | POA: Diagnosis not present

## 2016-11-10 DIAGNOSIS — Z8673 Personal history of transient ischemic attack (TIA), and cerebral infarction without residual deficits: Secondary | ICD-10-CM

## 2016-11-10 DIAGNOSIS — M109 Gout, unspecified: Secondary | ICD-10-CM | POA: Diagnosis not present

## 2016-11-10 NOTE — Progress Notes (Signed)
DATE:  11/10/2016   MRN:  [id number]  BIRTHDAY: 1950-08-02  Facility:  Nursing Home Location:  [facility] [place] Room Number: 105-A  LEVEL OF CARE:  SNF (31)  Contact Information    Name Relation Home Work Mobile   [place] Brother [phone]         Code Status History    Date Active Date Inactive Code Status Order ID Comments User Context   10/23/2015  5:13 PM 11/08/2015  9:08 PM Full Code [id number]  Zada Finders, MD Inpatient   01/22/2015  9:01 PM 01/26/2015  2:45 PM Full Code [id number]  Lily Kocher, MD Inpatient    Advance Directive Documentation     Most Recent Value  Type of Advance Directive  Out of facility DNR (pink MOST or yellow form)  Pre-existing out of facility DNR order (yellow form or pink MOST form)  -  "MOST" Form in Place?  -       Chief Complaint  Patient presents with  . Medical Management of Chronic Issues    Routine visit    HISTORY OF PRESENT ILLNESS:  This is a 55-YO male seen for a routine visit.  He is a long-term care resident of [facility]. He was seen in his room today. He was seen in his room today. He was seen writing on his notebook. He has dysarthria. He has PMH of CVA, arthrosclerosis, CAD, hypertension, diabetes mellitus and hyperlipidemia.    PAST MEDICAL HISTORY:  Past Medical History:  Diagnosis Date  . Diabetes mellitus without complication ([place])   . Dysarthria   . Dysphagia   . GI bleed   . Hyperlipidemia   . Hypertension   . Renal insufficiency 09/22/2016  . Stroke [facility])      CURRENT MEDICATIONS: Reviewed  Patient's Medications  New Prescriptions   No medications on file  Previous Medications   ACETAMINOPHEN (TYLENOL) 650 MG CR TABLET    Take 650 mg by mouth every 6 (six) hours as needed for pain or fever.    ALLOPURINOL (ZYLOPRIM) 150 MG TABS TABLET    Place 150 mg into feeding tube daily.   AMLODIPINE (NORVASC) 10 MG TABLET    Place 10 mg into feeding tube  daily.   ASPIRIN 325 MG TABLET    Place 325 mg into feeding tube daily.   ATENOLOL (TENORMIN) 50 MG TABLET    Place 50 mg into feeding tube 2 (two) times daily.   ATORVASTATIN (LIPITOR) 80 MG TABLET    Place 80 mg into feeding tube daily.    CARBOXYMETHYLCELLULOSE (REFRESH PLUS) 0.5 % SOLN    Place 1 drop into both eyes 3 (three) times daily as needed (dry eyes).   CHOLECALCIFEROL (VITAMIN D) 1000 UNITS TABLET    1,000 Units daily. Give via PEG   DIVALPROEX (DEPAKOTE) 250 MG DR TABLET    Take 250 mg by mouth 2 (two) times daily.   ENALAPRIL (VASOTEC) 20 MG TABLET    Take 1 tablet (20 mg total) by mouth daily.   FOLIC ACID (FOLVITE) 1 MG TABLET    Place 1 mg into feeding tube daily.    NUTRITIONAL SUPPLEMENTS (ISOSOURCE 1.5 CAL) LIQD    Give 2 cans three times daily bolus via PEG tub with 30 ml flushes before and after feeding and medications. This will provide 2250 Kcal per day.   QUETIAPINE (SEROQUEL) 25 MG TABLET    Take 25 mg by mouth 2 (two) times  daily. 1200 and 1800   RANITIDINE (ZANTAC) 15 MG/ML SYRUP    Place into feeding tube 2 (two) times daily. Take 5 mL q12h  Modified Medications   No medications on file  Discontinued Medications   ATENOLOL (TENORMIN) 50 MG TABLET    Place 50 mg into feeding tube 2 (two) times daily.     No Known Allergies   REVIEW OF SYSTEMS: Unable to obtain due to dysarthria    PHYSICAL EXAMINATION  GENERAL APPEARANCE: Well nourished. In no acute distress.  SKIN:  Skin is warm and dry.  HEAD: Normal in size and contour. No evidence of trauma EYES: Lids open and close normally. No blepharitis, entropion or ectropion. PERRL. Conjunctivae are clear and sclerae are white. Lenses are without opacity EARS: Pinnae are normal. Patient hears normal voice tunes of the examiner MOUTH and THROAT: Lips are without lesions. Oral mucosa is moist and without lesions. Tongue is normal in shape, size, and color and without lesions NECK: supple, trachea midline, no neck  masses, no thyroid tenderness, no thyromegaly LYMPHATICS: no LAN in the neck, no supraclavicular LAN RESPIRATORY: breathing is even & unlabored, BS CTAB CARDIAC: RRR, no murmur,no extra heart sounds, no edema GI: abdomen soft, normal BS, no masses, no tenderness, no hepatomegaly, no splenomegaly, PEG tube on LUQ EXTREMITIES:  Able to move RUE and RLE; has left hemiplegia NEUROLOGICAL: There is no tremor, has dysarthria PSYCHIATRIC:  Affect and behavior are appropriate   LABS/RADIOLOGY: Labs reviewed: Basic Metabolic Panel:  Recent Labs  04/11/16 09/14/16 09/28/16  NA 140 138 140  K 4.5 4.6 4.5  BUN 35* 55* 36*  CREATININE 1.1 1.7* 1.2   Liver Function Tests:  Recent Labs  12/30/15 04/11/16  AST 16 56*  ALT 16 74*  ALKPHOS 81 119   CBC:  Recent Labs  09/06/16 09/20/16 09/28/16  WBC 7.5 7.1 9.5  HGB 13.5 13.1* 13.0*  HCT 42 39* 40*  PLT 153 157 165   Lipid Panel:  Recent Labs  02/11/16 09/14/16  HDL 46 30*    ASSESSMENT/PLAN:  1. History of CVA (cerebrovascular accident) - Stable; continue aspirin 325 mg 1 tab by  daily, Lipitor 80 mg 1 tab Q HS, enalapril maleate 20 mg 1 tab daily, atenolol 50 mg 1 tab twice a day and amlodipine besylate 10 mg 1 tab daily   2. Left spastic hemiplegia ([place]) - follows up at rehab clinic and gets botox injections, continue supportive care and fall precautions  3. Essential hypertension - well controlled; continue enalapril maleate 20 mg 1 tab daily, atenolol 50 mg 1 tab twice a day and amlodipine besylate 10 mg 1 tab daily  4. Mixed hyperlipidemia - continue Lipitor 80 mg daily at bedtime Lab Results  Component Value Date   CHOL 94 09/14/2016   HDL 30 (A) 09/14/2016   LDLCALC 48 09/14/2016   TRIG 81 09/14/2016   CHOLHDL 3.2 10/24/2015    5. Gout, unspecified cause, unspecified chronicity, unspecified site - stable; continue allopurinol 150 mg daily  6. Mood disorder (HCC) - mood is stable; continue divalproex DR 125 mg 2  capsules = 250 mg twice a day, followed up by [facility] NP  7. H/O: upper GI bleed - no signs ofbleeding; continue ranitidine 15 mg/ML give 5 mL every 12 hours  8. Dysphagia, unspecified type - continue Isosource 1.5 give 2 cans via PEG tube TID, aspiration precautions  9. Depressive psychosis ([place]) - continue Quetiapine 25 mg 1 tab  twice a day  10. Vascular dementia with behavior disturbance - continue supportive care, fall precautions     Goals of care:  Long-term care    Monina C. [place] - NP    [employer] [phone]

## 2016-11-13 DIAGNOSIS — I69322 Dysarthria following cerebral infarction: Secondary | ICD-10-CM | POA: Diagnosis not present

## 2016-11-14 DIAGNOSIS — I69322 Dysarthria following cerebral infarction: Secondary | ICD-10-CM | POA: Diagnosis not present

## 2016-11-15 ENCOUNTER — Ambulatory Visit (INDEPENDENT_AMBULATORY_CARE_PROVIDER_SITE_OTHER): Payer: Medicare Other | Admitting: Neurology

## 2016-11-15 DIAGNOSIS — G8194 Hemiplegia, unspecified affecting left nondominant side: Secondary | ICD-10-CM

## 2016-11-15 DIAGNOSIS — I69391 Dysphagia following cerebral infarction: Secondary | ICD-10-CM | POA: Diagnosis not present

## 2016-11-15 DIAGNOSIS — I63311 Cerebral infarction due to thrombosis of right middle cerebral artery: Secondary | ICD-10-CM | POA: Diagnosis not present

## 2016-11-15 NOTE — Progress Notes (Signed)
STROKE NEUROLOGY FOLLOW UP NOTE  NAME: Sean Collins DOB: 08/23/1950  REASON FOR VISIT: stroke follow up HISTORY FROM: son and chart  Today we had the pleasure of seeing Sean Collins in follow-up at our Neurology Clinic. Pt was accompanied by son.   History Summary Sean Collins is a 66 y.o. male with history of HTN, HLD, pre-DM, previous stroke, MI s/p stent, current smoker was admitted on 10/23/15 for slurry speech and gait imbalance. MRI found to have right BG/CR punctate infarcts. MRA showed diffuse athero including right M2, BA, bilateral PCA. Carotid Doppler 40-59% right ICA stenosis. CT of the neck showed right ICA 50% stenosis with soft plaques, left ICA 40% stenosis and mild bilateral VA stenosis. TTE with EF 55-60%, and DVT negative. LDL 83 and A1c 6.1. Head aspirin 81 and Plavix PTA, increase to aspirin 325 and continue Plavix for both cardiac and stroke prevention. Resumed Lipitor. During the admission, patient symptoms getting worse, progress to left hemiplegia and severe dysarthria and dysphagia. MRI showed more prominent right BG/CR infarction. Failed swallow and CXR concerning for bilateral aspiration pneumonia, put on Unasyn and underwent PEG placement. VVS consulted and recommended follow-up as outpatient to consider right CEA. He subsequently had hematemesis (2 cups worth) and melena and was transferred to the ICU. He received 2 units PRBCs. GI performed an EGD on 6/28 which revealed active bleeding in the gastric body related to the PEG tube site. The bleeding resolved after the PEG tube site was tightened. His hemoglobin has remained stable in the 9 range and he was transferred back to IMTS . After 2 days of him being stable, Aspirin and Plavix restarted but has one episode of bloody BM at night. Both ASA and plavix were held again with Aspirin resumed the second day but not Plavix.Patient was later discharge to SNF with PEG and tube feeding and ASA and  lipitor.   History of stroke  11/2011 - left CR - MRA diffuse athero - CUS and TTE negative - on ASA  01/2015 - right semi ovale punctate infarcts - MRA diffuse athero - CUS and TTE negative - LDL 180 and A1C 6.3 - ASA + plavix + zocor  12/28/15 follow-up - the patient has been doing stable. He still has severe dysarthria and intelligible words, has to communicate with writing. Excessive saliva and not able to swallow. Still has PEG for bolus feed. As per son and note, on 12/27/15 "With improvement in his dysphagia, bolus feeding was discontinued. He now is described as having decreased mental alertness and choking with food intake". His mental status change could be related to aspiration. Today in clinic, he has not mental status change, able to communicate with me via writing, asking "for better food" "need to change the cook" "my arm is not strong". I do not think he is safe to eat at this time and he certainly needs bolus feed via PEG to maintain enough nutrition. Follow with Dr. Oneida Alar for ICA stenosis, recommend medical treatment. BP 114/70.  05/15/16 follow up - the patient has been doing nearly the same. He still has severe dysarthria and dysphasia, still on PEG for tube feeding. Still has left UE plegia, and LLE weakness. Still in wheelchair. Follow with Dr. Laury Axon for left shoulder pain and injection, currently doing better. Still in SNF, undergoing PT/OT/speech. Able to stand up with walker but not able to walk yet. BP 106/68.   Interval History During the interval time, the patient  has no significant improvement but seems weaker at LLE than last time, likely due to lack of activity and motivation. Still in SNF but no more therapy due to max out neuro benefit. Wheelchair bound with left hemiplegia and left hand contracture. BP today 117/65.   REVIEW OF SYSTEMS: Full 14 system review of systems performed and notable only for those listed below and in HPI above, all others are negative:    Constitutional:   Cardiovascular:  Ear/Nose/Throat:   Skin:  Eyes:   Respiratory:   Gastroitestinal:   Genitourinary:  Hematology/Lymphatic:   Endocrine:  Musculoskeletal:   Allergy/Immunology:   Neurological:   Psychiatric:  Sleep:   The following represents the patient's updated allergies and side effects list: No Known Allergies  The neurologically relevant items on the patient's problem list were reviewed on today's visit.  Neurologic Examination  A problem focused neurological exam (12 or more points of the single system neurologic examination, vital signs counts as 1 point, cranial nerves count for 8 points) was performed.  There were no vitals taken for this visit.  General - Well nourished, well developed, severe dysarthria.  Ophthalmologic - Fundi not visualized due to noncooperation.  Cardiovascular - Regular rate and rhythm with no murmur.  Mental Status -  Awake, alert, severe dysarthria with intelligible words, not able to answer orientation questions. Follows commands, able to write for communication. Since able to name and repeat, however with intelligible words.  Cranial Nerves II - XII - II - Visual field intact OU. III, IV, VI - Extraocular movements intact. V - Facial sensation intact bilaterally. VII - mild left facial droop. VIII - Hearing & vestibular intact bilaterally. X - Palate elevation impaired, with severe dysarthria and intelligible words. XI - Chin turning & shoulder shrug intact bilaterally. XII - Tongue protrusion not able to do.  Motor Strength - The patient's strength was 0/5 LUE and LLE with left leg brace and 5/5 RUE and 4/5 RLE.  Bulk was normal and fasciculations were absent.   Motor Tone - Muscle tone was assessed at the neck and appendages and was increased on the left UE but decreased on the left LE. Left hand contracture   Reflexes - The patient's reflexes were 1+ in all extremities and he had no pathological  reflexes.  Sensory - Light touch, temperature/pinprick were assessed and were symmetrical.    Coordination - The patient had normal movements in the right hand with no ataxia or dysmetria.  Tremor was absent.  Gait and Station - not tested, in the wheelchair.   Data reviewed: I personally reviewed the images and agree with the radiology interpretations.  Ct Head Wo Contrast 10/23/2015   No acute intracranial process. Chronic microvascular ischemic changes and cortical atrophy.   Mri & Mra Head/brain Wo Cm 10/23/2015   Acute RIGHT MCA territory lenticulostriate infarct, nonhemorrhagic. Atrophy and small vessel disease. Scattered foci of chronic hemorrhagic and nonhemorrhagic lacunar infarcts. Incompletely evaluated LEFT ethmoid air cell opacity, possible developing mucocele.  No proximal large vessel occlusion or flow-limiting stenosis. Intracranial atherosclerotic change most notable in the LEFT A1 ACA and LEFT P2 PCA, as well as BILATERAL MCA M2 and M3 vessels.  CTA neck 50% diameter stenosis at the origin of the right internal carotid artery. 15 mm distally there is another 50% diameter stenosis due to noncalcified atherosclerotic plaque. 40% diameter stenosis proximal left internal carotid artery. Mild stenosis at the proximal vertebral artery bilaterally. Both vertebral arteries patent.  CUS - Right:  40-59% ICA stenosis (mid to distal),highest end of scale, however, higher velocities may be obscured as patient has high bifurcation and significant plaquing. Left: 1-39% ICA stenosis. Unable to adequately visualize the distal ICA secondary to high bifurcation. Bilateral vertebral artery flow is antegrade.   LE venous doppler - There is no DVT or SVT noted in the bilateral lower extremities.   TTE  - Left ventricle: The cavity size was normal. Wall thickness was normal. Systolic function was normal. The estimated ejection fraction was in the range of 55% to 60%. Wall motion was  normal; there were no regional wall motion abnormalities. Left ventricular diastolic function parameters were normal. - Mitral valve: Calcified annulus. Mildly thickened leaflets . - Left atrium: The atrium was mildly dilated. - Right atrium: The atrium was mildly dilated. - Atrial septum: No defect or patent foramen ovale was identified. - Tricuspid valve: There was moderate regurgitation. - Pulmonary arteries: PA peak pressure: 55 mm Hg (S). - Pericardium, extracardiac: A trivial pericardial effusion was identified.  Ct Angio Neck W Or Wo Contrast 10/24/2015  IMPRESSION: 50% diameter stenosis at the origin of the right internal carotid artery. 15 mm distally there is another 50% diameter stenosis due to noncalcified atherosclerotic plaque. 40% diameter stenosis proximal left internal carotid artery. Mild stenosis at the proximal vertebral artery bilaterally. Both vertebral arteries patent.    Mr Brain Wo Contrast 10/25/2015  IMPRESSION: Interval enlargement of right posterior corona radiata/ posterior right lenticular nucleus nonhemorrhagic infarct. Remote left frontal lobe infarct with encephalomalacia. Remote corona radiata and basal ganglia infarcts bilaterally. Moderate global atrophy without hydrocephalus. Complex persistent opacification mid to posterior left ethmoid sinus air cells. Mild mucosal thickening remainder of ethmoid sinus air cells. Minimal to mild mucosal thickening frontal sinuses. Minimal maxillary sinus mucosal thickening.  Component     Latest Ref Rng & Units 10/23/2015 10/24/2015  Cholesterol     0 - 200 mg/dL  138  Triglycerides     <150 mg/dL  59  HDL Cholesterol     >40 mg/dL  43  Total CHOL/HDL Ratio     RATIO  3.2  VLDL     0 - 40 mg/dL  12  LDL (calc)     0 - 99 mg/dL  83  Hemoglobin A1C     4.8 - 5.6 % 6.1 (H)   Mean Plasma Glucose     mg/dL 128     Assessment: As you may recall, he is a 66 y.o. African American male with PMH of HTN, HLD, pre-DM,  previous stroke at left CR in 2013 and right SO in 2016, MI s/p stent, smoker was admitted on 10/23/15 for right BG/CR punctate infarcts. MRA showed diffuse athero including right M2, BA, bilateral PCA. Carotid Doppler 40-59% right ICA stenosis. CT of the neck showed right ICA 50% stenosis with soft plaques, left ICA 40% stenosis and mild bilateral VA stenosis. TTE with EF 55-60%, and DVT negative. LDL 83 and A1c 6.1. Had aspirin 81 and Plavix PTA, increase to aspirin 325 and continue Plavix for both cardiac and stroke prevention. Resumed Lipitor. During the admission, patient symptoms getting worse, progress to left hemiplegia and severe dysarthria and dysphagia. MRI showed more prominent right BG/CR infarction. Failed swallow and underwent PEG placement. VVS consulted and recommended follow-up as outpatient to consider right CEA in the future. He subsequently developed GI bleeding requiring blood transfusion. Aspirin resumed but not Plavix.Patient was later discharge to SNF with PEG and tube feeding along with  ASA and lipitor. During interval time, he still has severe dysarthria and intelligible words, has to communicate with writing. Still has dysphagia and needs PEG for bolus feed. Follow with VVS for right ICA stenosis and recommend medication treatment.    Plan:  - continue ASA and lipitor for stroke prevention.  - check BP at facility daily and record - continue PT/OT or encourage self exercise in SNF to maintain the function. - Follow up with your primary care physician for stroke risk factor modification. Recommend maintain blood pressure goal 130-150/80, diabetes with hemoglobin A1c goal below 7.0% and lipids with LDL cholesterol goal below 70 mg/dL. - continue tube feeding and avoid aspiration  - follow up in 6 months with carolyn. D/c from clinic if stabilized.  I spent more than 25 minutes of face to face time with the patient. Greater than 50% of time was spent in counseling and coordination  of care. We discussed tube feeding, supportive care.    No orders of the defined types were placed in this encounter.   No orders of the defined types were placed in this encounter.   Patient Instructions  - continue ASA and lipitor for stroke prevention.  - check BP at facility daily and record - continue PT/OT or encourage self exercise in facility to maintain the function. - Follow up with your primary care physician for stroke risk factor modification. Recommend maintain blood pressure goal 130-150/80, diabetes with hemoglobin A1c goal below 7.0% and lipids with LDL cholesterol goal below 70 mg/dL. - continue tube feeding and avoid aspiration  - follow up in 6 months.   Rosalin Hawking, MD PhD [facility] [address] [place], [place] [address] [phone]

## 2016-11-15 NOTE — Patient Instructions (Addendum)
-   continue ASA and lipitor for stroke prevention.  - check BP at facility daily and record - continue PT/OT or encourage self exercise in facility to maintain the function. - Follow up with your primary care physician for stroke risk factor modification. Recommend maintain blood pressure goal 130-150/80, diabetes with hemoglobin A1c goal below 7.0% and lipids with LDL cholesterol goal below 70 mg/dL. - continue tube feeding and avoid aspiration  - follow up in 6 months.

## 2016-11-15 NOTE — Telephone Encounter (Signed)
Rn contacted [employer] in accounting, pts brother did pay for the [place] form of 50.00 dollars. Form was done by Dr.Xu.

## 2016-11-16 ENCOUNTER — Encounter: Payer: Self-pay | Admitting: Neurology

## 2016-11-16 DIAGNOSIS — I69322 Dysarthria following cerebral infarction: Secondary | ICD-10-CM | POA: Diagnosis not present

## 2016-11-17 DIAGNOSIS — I69322 Dysarthria following cerebral infarction: Secondary | ICD-10-CM | POA: Diagnosis not present

## 2016-11-20 ENCOUNTER — Encounter: Payer: Self-pay | Admitting: Internal Medicine

## 2016-11-20 ENCOUNTER — Non-Acute Institutional Stay (SKILLED_NURSING_FACILITY): Payer: Medicare Other | Admitting: Internal Medicine

## 2016-11-20 DIAGNOSIS — E1121 Type 2 diabetes mellitus with diabetic nephropathy: Secondary | ICD-10-CM

## 2016-11-20 DIAGNOSIS — E782 Mixed hyperlipidemia: Secondary | ICD-10-CM

## 2016-11-20 DIAGNOSIS — I69391 Dysphagia following cerebral infarction: Secondary | ICD-10-CM

## 2016-11-20 DIAGNOSIS — I10 Essential (primary) hypertension: Secondary | ICD-10-CM | POA: Diagnosis not present

## 2016-11-20 NOTE — Assessment & Plan Note (Addendum)
LDL is at goal of less than 70; no change in high-dose atorvastatin

## 2016-11-20 NOTE — Assessment & Plan Note (Signed)
A1c at goal of less than 7% with a value of 5.9%.

## 2016-11-20 NOTE — Assessment & Plan Note (Signed)
Continue PEG supplementation

## 2016-11-20 NOTE — Assessment & Plan Note (Signed)
BP controlled; no change in antihypertensive medications  

## 2016-11-20 NOTE — Patient Instructions (Signed)
See assessment and plan under each diagnosis in the problem list and acutely for this visit 

## 2016-11-20 NOTE — Progress Notes (Signed)
This is a nursing facility follow up for specific acute issue of essential hypertension  Interim medical record and care since last [place] visit was updated with review of diagnostic studies and change in clinical status since last visit were documented.  HPI: The patient was seen by Dr. Erlinda Hong 11/15/16 for follow-up of stroke. Because of progressive neurologic insult aspirin was increased from 81 mg to 325 mg and Plavix was continued for cardiac and stroke prevention. Unfortunately patient progressed to left hemiplegia with severe dysarthria and dysphagia. PEG was required because of his failing swallow test. Plavix was discontinued because of GI bleed requiring transfusion. Lipitor was continued with an LDL goal of less than 70. Blood pressure goal to be 130-150/80. Hemoglobin A1c goal is to be less than 7%.  Follow-up was to be in 6 months. Most recent labs were 09/28/16. Creatinine was stable at 1.2, prerenal azotemia was present with a BUN 36. His most recent A1c was in the nondiabetic range of 5.9% on 5/10; repeat A1c would not be due until mid-August. LDL was at goal at 48 on 5/10. At the SNF his blood pressures have ranged from 118/68-140/73.  Review of systems: Dysarthria precluded completing review of systems .Replies are essentially unintelligible.    Physical exam:  Pertinent or positive findings: He stares blankly ahead. He has marked plaque formation over the teeth. Rhonchi are asymmetric, greater on the right than the left. Heart sounds are distant and rate is slow. PEG is present. Pulses are decreased. He has dense hemiparesis on the left.  General appearance:Adequately nourished; no acute distress , increased work of breathing is present.   Lymphatic: No lymphadenopathy about the head, neck, axilla . Eyes: No conjunctival inflammation or lid edema is present. There is no scleral icterus. Ears:  External ear exam shows no significant lesions or deformities.   Nose:   External nasal examination shows no deformity or inflammation. Nasal mucosa are pink and moist without lesions ,exudates Oral exam: lips and gums are healthy appearing.There is no oropharyngeal erythema or exudate . Neck:  No thyromegaly, masses, tenderness noted.    Heart:  No gallop, murmur, click, rub .  Abdomen:Bowel sounds are normal. Abdomen is soft and nontender with no organomegaly, hernias,masses. GU: deferred  Extremities:  No cyanosis, clubbing,edema  Neurologic exam : Strengthgood  In R upper & lower extremities Balance,Rhomberg,finger to nose testing could not be completed due to clinical state Skin: Warm & dry w/o tenting. No significant lesions or rash.  See summary under each active problem in the Problem List with associated updated therapeutic plan

## 2016-11-21 DIAGNOSIS — I69322 Dysarthria following cerebral infarction: Secondary | ICD-10-CM | POA: Diagnosis not present

## 2016-11-22 ENCOUNTER — Encounter: Payer: Self-pay | Admitting: Internal Medicine

## 2016-11-22 DIAGNOSIS — I69322 Dysarthria following cerebral infarction: Secondary | ICD-10-CM | POA: Diagnosis not present

## 2016-11-23 DIAGNOSIS — I69322 Dysarthria following cerebral infarction: Secondary | ICD-10-CM | POA: Diagnosis not present

## 2016-11-24 DIAGNOSIS — Z7984 Long term (current) use of oral hypoglycemic drugs: Secondary | ICD-10-CM | POA: Diagnosis not present

## 2016-11-24 DIAGNOSIS — E1151 Type 2 diabetes mellitus with diabetic peripheral angiopathy without gangrene: Secondary | ICD-10-CM | POA: Diagnosis not present

## 2016-11-24 DIAGNOSIS — I69322 Dysarthria following cerebral infarction: Secondary | ICD-10-CM | POA: Diagnosis not present

## 2016-11-24 DIAGNOSIS — B351 Tinea unguium: Secondary | ICD-10-CM | POA: Diagnosis not present

## 2016-11-25 DIAGNOSIS — I69322 Dysarthria following cerebral infarction: Secondary | ICD-10-CM | POA: Diagnosis not present

## 2016-11-27 DIAGNOSIS — I69322 Dysarthria following cerebral infarction: Secondary | ICD-10-CM | POA: Diagnosis not present

## 2016-11-29 DIAGNOSIS — I69322 Dysarthria following cerebral infarction: Secondary | ICD-10-CM | POA: Diagnosis not present

## 2016-11-30 DIAGNOSIS — I69322 Dysarthria following cerebral infarction: Secondary | ICD-10-CM | POA: Diagnosis not present

## 2016-12-01 ENCOUNTER — Encounter: Payer: Medicare Other | Attending: Physical Medicine & Rehabilitation

## 2016-12-01 ENCOUNTER — Ambulatory Visit (HOSPITAL_BASED_OUTPATIENT_CLINIC_OR_DEPARTMENT_OTHER): Payer: Medicare Other | Admitting: Physical Medicine & Rehabilitation

## 2016-12-01 ENCOUNTER — Encounter: Payer: Self-pay | Admitting: Physical Medicine & Rehabilitation

## 2016-12-01 VITALS — BP 106/67 | HR 53 | Resp 14

## 2016-12-01 DIAGNOSIS — G8114 Spastic hemiplegia affecting left nondominant side: Secondary | ICD-10-CM | POA: Diagnosis not present

## 2016-12-01 DIAGNOSIS — M25512 Pain in left shoulder: Secondary | ICD-10-CM | POA: Insufficient documentation

## 2016-12-01 DIAGNOSIS — Z8673 Personal history of transient ischemic attack (TIA), and cerebral infarction without residual deficits: Secondary | ICD-10-CM | POA: Insufficient documentation

## 2016-12-01 DIAGNOSIS — I69322 Dysarthria following cerebral infarction: Secondary | ICD-10-CM | POA: Diagnosis not present

## 2016-12-01 NOTE — Patient Instructions (Signed)

## 2016-12-01 NOTE — Progress Notes (Signed)
Botox Injection for spasticity using needle EMG guidance  Dilution: 100 Units/ml Indication: Severe spasticity which interferes with ADL,mobility and/or  hygiene and is unresponsive to medication management and other conservative care Informed consent was obtained after describing risks and benefits of the procedure with the patient. This includes bleeding, bruising, infection, excessive weakness, or medication side effects. A REMS form is on file and signed. Needle: 27g 1" needle electrode Number of units per muscle FCR50 FCU25 FDS50 FDP50 PT25 Pectoralis 100 All injections were done after obtaining appropriate EMG activity and after negative drawback for blood. The patient tolerated the procedure well. Post procedure instructions were given. A followup appointment was made.   Based on EMG activity would recommend the following treatments for next visit. FCR 50  FDS, 25 FDP 25 Pronator teres 50 Pectoralis 50

## 2016-12-04 DIAGNOSIS — I69322 Dysarthria following cerebral infarction: Secondary | ICD-10-CM | POA: Diagnosis not present

## 2016-12-05 DIAGNOSIS — I69322 Dysarthria following cerebral infarction: Secondary | ICD-10-CM | POA: Diagnosis not present

## 2016-12-06 DIAGNOSIS — I69322 Dysarthria following cerebral infarction: Secondary | ICD-10-CM | POA: Diagnosis not present

## 2016-12-07 ENCOUNTER — Non-Acute Institutional Stay (SKILLED_NURSING_FACILITY): Payer: Medicare Other | Admitting: Adult Health

## 2016-12-07 ENCOUNTER — Encounter: Payer: Self-pay | Admitting: Adult Health

## 2016-12-07 DIAGNOSIS — Z8719 Personal history of other diseases of the digestive system: Secondary | ICD-10-CM

## 2016-12-07 DIAGNOSIS — G8114 Spastic hemiplegia affecting left nondominant side: Secondary | ICD-10-CM | POA: Diagnosis not present

## 2016-12-07 DIAGNOSIS — I69391 Dysphagia following cerebral infarction: Secondary | ICD-10-CM | POA: Diagnosis not present

## 2016-12-07 DIAGNOSIS — F323 Major depressive disorder, single episode, severe with psychotic features: Secondary | ICD-10-CM

## 2016-12-07 DIAGNOSIS — F0151 Vascular dementia with behavioral disturbance: Secondary | ICD-10-CM

## 2016-12-07 DIAGNOSIS — F39 Unspecified mood [affective] disorder: Secondary | ICD-10-CM | POA: Diagnosis not present

## 2016-12-07 DIAGNOSIS — M109 Gout, unspecified: Secondary | ICD-10-CM | POA: Diagnosis not present

## 2016-12-07 DIAGNOSIS — E782 Mixed hyperlipidemia: Secondary | ICD-10-CM

## 2016-12-07 DIAGNOSIS — I69322 Dysarthria following cerebral infarction: Secondary | ICD-10-CM | POA: Diagnosis not present

## 2016-12-07 DIAGNOSIS — Z8673 Personal history of transient ischemic attack (TIA), and cerebral infarction without residual deficits: Secondary | ICD-10-CM

## 2016-12-07 DIAGNOSIS — F01518 Vascular dementia, unspecified severity, with other behavioral disturbance: Secondary | ICD-10-CM

## 2016-12-07 DIAGNOSIS — I10 Essential (primary) hypertension: Secondary | ICD-10-CM | POA: Diagnosis not present

## 2016-12-07 NOTE — Progress Notes (Signed)
DATE:  12/07/2016   MRN:  [id number]  BIRTHDAY: January 18, 1951  Facility:  Nursing Home Location:  [facility] Room Number: 105-A  LEVEL OF CARE:  SNF (31)  Contact Information    Name Relation Home Work Mobile   [place] Brother [phone]         Code Status History    Date Active Date Inactive Code Status Order ID Comments User Context   10/23/2015  5:13 PM 11/08/2015  9:08 PM Full Code [id number]  Zada Finders, MD Inpatient   01/22/2015  9:01 PM 01/26/2015  2:45 PM Full Code [id number]  Lily Kocher, MD Inpatient       Chief Complaint  Patient presents with  . Medical Management of Chronic Issues    Routine visit    HISTORY OF PRESENT ILLNESS:  This is a 77-YO male seen for a routine visit.  He is a long-term care resident of [facility].  He has a PMH of CVA, arthrosclerosis, CAD, hypertension, diabetes mellitus and hyperlipidemia. He was seen in his room today. Noted G-tube to be discolored and disfigured. He will need referral to Interventional Radiology for PEG tube change. He had a recent botox injection for left spastic hemiparesis.     PAST MEDICAL HISTORY:  Past Medical History:  Diagnosis Date  . Diabetes mellitus without complication ([place])   . Dysarthria   . Dysphagia   . GI bleed   . Hyperlipidemia   . Hypertension   . Renal insufficiency 09/22/2016  . Stroke [facility])      CURRENT MEDICATIONS: Reviewed  Patient's Medications  New Prescriptions   No medications on file  Previous Medications   ACETAMINOPHEN (TYLENOL) 650 MG CR TABLET    650 mg every 6 (six) hours as needed for pain or fever.    ALLOPURINOL (ZYLOPRIM) 150 MG TABS TABLET    Place 150 mg into feeding tube daily.   AMLODIPINE (NORVASC) 10 MG TABLET    Place 10 mg into feeding tube daily.   ASPIRIN 325 MG TABLET    Place 325 mg into feeding tube daily.   ATENOLOL (TENORMIN) 50 MG TABLET    Place 50 mg into feeding tube 2 (two) times daily.    ATORVASTATIN (LIPITOR) 80 MG TABLET    Place 80 mg into feeding tube daily.    CARBOXYMETHYLCELLULOSE (REFRESH PLUS) 0.5 % SOLN    Place 1 drop into both eyes 3 (three) times daily as needed (dry eyes).   CHOLECALCIFEROL (VITAMIN D) 1000 UNITS TABLET    1,000 Units daily. Give via PEG   DIVALPROEX (DEPAKOTE) 250 MG DR TABLET    250 mg 2 (two) times daily.    ENALAPRIL (VASOTEC) 20 MG TABLET    Take 1 tablet (20 mg total) by mouth daily.   FOLIC ACID (FOLVITE) 1 MG TABLET    Place 1 mg into feeding tube daily.    NUTRITIONAL SUPPLEMENTS (ISOSOURCE 1.5 CAL) LIQD    Give 2 cans three times daily bolus via PEG tub with 30 ml flushes before and after feeding and medications. This will provide 2250 Kcal per day.   QUETIAPINE (SEROQUEL) 25 MG TABLET    Place 25 mg into feeding tube 2 (two) times daily. 1200 and 1800    RANITIDINE (ZANTAC) 15 MG/ML SYRUP    Place into feeding tube 2 (two) times daily. Take 5 mL q12h  Modified Medications   No medications on file  Discontinued Medications  No medications on file     No Known Allergies   REVIEW OF SYSTEMS:  Unable to obtain due to dysarthria    PHYSICAL EXAMINATION  GENERAL APPEARANCE: Well nourished. In no acute distress. Normal body habitus SKIN:  Skin is warm and dry.  HEAD: Normal in size and contour. No evidence of trauma EYES: Lids open and close normally. No blepharitis, entropion or ectropion. EARS: Pinnae are normal. Patient hears normal voice tunes of the examiner MOUTH and THROAT: Lips are without lesions. Oral mucosa is moist and without lesions.  RESPIRATORY: breathing is even & unlabored, BS CTAB CARDIAC: RRR, no murmur,no extra heart sounds, no edema GI: abdomen soft, normal BS, no masses, no tenderness, +peg tube on LUQ which is discolored (yellowish/dirty white) and disfigured (old) EXTREMITIES:  Able to move RUE and RLE, no movement on LUE, able to move 1-2/5 LLE NEUROLOGICAL: +dysarthria,  Left facial  droop PSYCHIATRIC:  Affect and behavior are appropriate   LABS/RADIOLOGY: Labs reviewed: Basic Metabolic Panel:  Recent Labs  04/11/16 09/14/16 09/28/16  NA 140 138 140  K 4.5 4.6 4.5  BUN 35* 55* 36*  CREATININE 1.1 1.7* 1.2   Liver Function Tests:  Recent Labs  12/30/15 04/11/16  AST 16 56*  ALT 16 74*  ALKPHOS 81 119   CBC:  Recent Labs  09/06/16 09/20/16 09/28/16  WBC 7.5 7.1 9.5  HGB 13.5 13.1* 13.0*  HCT 42 39* 40*  PLT 153 157 165   Lipid Panel:  Recent Labs  02/11/16 09/14/16  HDL 46 30*    ASSESSMENT/PLAN:  1. History of CVA (cerebrovascular accident) - Stable; continue aspirin 325 mg 1 tab daily, atenolol 50 mg 1 tab twice a day, enalapril 20 mg 1 tab daily , Lipitor 80 mg 1 tab daily at bedtime and amlodipine 10 mg 1 tab daily   2. Left spastic hemiplegia ([place]) - had a recent botox injection c/o physical medicine and rehabilitation, fall precautions   3. Mixed hyperlipidemia - continue Lipitor 80 mg 1 tab daily at bedtime Lab Results  Component Value Date   CHOL 94 09/14/2016   HDL 30 (A) 09/14/2016   LDLCALC 48 09/14/2016   TRIG 81 09/14/2016   CHOLHDL 3.2 10/24/2015    4. Dysphagia due to recent stroke - will need referral to interventional radiology for change of PEG tube; NPO, continue Isosource 1.5 give 2 cans via peg tube TID, aspiration precautions   5. Gout, unspecified cause, unspecified chronicity, unspecified site - stable; continue allopurinol 150 mg daily   6. Essential hypertension - well controlled; continue enalapril maleate 20 mg 1 tab daily, amlodipine 10 mg 1 tab daily and atenolol 50 mg 1 tab daily   7. Mood disorder (HCC) - mood is stable; continue Depakote 125 mg sprinkles give 2 capsules = 250 mg twice a day   8. Vascular dementia with behavior disturbance - continue supportive care; fall precautions   9. Depressive psychosis ([place]) - continue Quetiapine 25 mg 1 tab twice a day; follows-up with [facility]  NP   10. H/O: upper GI bleed - no bleeding has been noted; continue ranitidine 15 mg/ML hip 5 mL every 12 hours     Goals of care:  Long-term care    Other Atienza C. [place] - NP    [employer] [phone]

## 2016-12-08 DIAGNOSIS — I69322 Dysarthria following cerebral infarction: Secondary | ICD-10-CM | POA: Diagnosis not present

## 2016-12-11 DIAGNOSIS — I69322 Dysarthria following cerebral infarction: Secondary | ICD-10-CM | POA: Diagnosis not present

## 2016-12-12 DIAGNOSIS — I69322 Dysarthria following cerebral infarction: Secondary | ICD-10-CM | POA: Diagnosis not present

## 2016-12-13 DIAGNOSIS — I69322 Dysarthria following cerebral infarction: Secondary | ICD-10-CM | POA: Diagnosis not present

## 2016-12-14 DIAGNOSIS — I69322 Dysarthria following cerebral infarction: Secondary | ICD-10-CM | POA: Diagnosis not present

## 2016-12-15 DIAGNOSIS — I69322 Dysarthria following cerebral infarction: Secondary | ICD-10-CM | POA: Diagnosis not present

## 2016-12-16 DIAGNOSIS — I69322 Dysarthria following cerebral infarction: Secondary | ICD-10-CM | POA: Diagnosis not present

## 2016-12-19 DIAGNOSIS — I69322 Dysarthria following cerebral infarction: Secondary | ICD-10-CM | POA: Diagnosis not present

## 2016-12-20 DIAGNOSIS — I69322 Dysarthria following cerebral infarction: Secondary | ICD-10-CM | POA: Diagnosis not present

## 2016-12-21 ENCOUNTER — Other Ambulatory Visit (HOSPITAL_COMMUNITY): Payer: Self-pay | Admitting: Internal Medicine

## 2016-12-21 DIAGNOSIS — R1310 Dysphagia, unspecified: Secondary | ICD-10-CM

## 2016-12-21 DIAGNOSIS — I69322 Dysarthria following cerebral infarction: Secondary | ICD-10-CM | POA: Diagnosis not present

## 2016-12-24 DIAGNOSIS — I69322 Dysarthria following cerebral infarction: Secondary | ICD-10-CM | POA: Diagnosis not present

## 2016-12-25 DIAGNOSIS — I69322 Dysarthria following cerebral infarction: Secondary | ICD-10-CM | POA: Diagnosis not present

## 2016-12-26 ENCOUNTER — Ambulatory Visit (HOSPITAL_COMMUNITY): Payer: PRIVATE HEALTH INSURANCE

## 2016-12-26 DIAGNOSIS — I69322 Dysarthria following cerebral infarction: Secondary | ICD-10-CM | POA: Diagnosis not present

## 2016-12-27 DIAGNOSIS — I69322 Dysarthria following cerebral infarction: Secondary | ICD-10-CM | POA: Diagnosis not present

## 2016-12-28 DIAGNOSIS — I69322 Dysarthria following cerebral infarction: Secondary | ICD-10-CM | POA: Diagnosis not present

## 2016-12-29 ENCOUNTER — Encounter (HOSPITAL_COMMUNITY): Payer: Self-pay | Admitting: Diagnostic Radiology

## 2016-12-29 ENCOUNTER — Ambulatory Visit (HOSPITAL_COMMUNITY)
Admission: RE | Admit: 2016-12-29 | Discharge: 2016-12-29 | Disposition: A | Discharge status: Home / Self Care | Payer: Medicare Other | Source: Ambulatory Visit | Attending: Internal Medicine | Admitting: Internal Medicine

## 2016-12-29 DIAGNOSIS — Z431 Encounter for attention to gastrostomy: Secondary | ICD-10-CM | POA: Insufficient documentation

## 2016-12-29 DIAGNOSIS — R1310 Dysphagia, unspecified: Secondary | ICD-10-CM

## 2016-12-29 DIAGNOSIS — K9423 Gastrostomy malfunction: Secondary | ICD-10-CM | POA: Diagnosis not present

## 2016-12-29 HISTORY — PX: IR REPLC GASTRO/COLONIC TUBE PERCUT W/FLUORO: IMG2333

## 2016-12-29 MED ORDER — LIDOCAINE VISCOUS 2 % MT SOLN
OROMUCOSAL | Status: AC
  Filled 2016-12-29: qty 15

## 2016-12-29 MED ORDER — IOPAMIDOL (ISOVUE-300) INJECTION 61%
INTRAVENOUS | Status: AC
  Administered 2016-12-29: 20 mL
  Filled 2016-12-29: qty 50

## 2016-12-29 NOTE — Procedures (Signed)
Successful exchange of gastrostomy tube without complication.  Minimal bleeding at insertion site.

## 2017-01-01 DIAGNOSIS — I69322 Dysarthria following cerebral infarction: Secondary | ICD-10-CM | POA: Diagnosis not present

## 2017-01-02 ENCOUNTER — Non-Acute Institutional Stay (SKILLED_NURSING_FACILITY): Payer: Medicare Other

## 2017-01-02 DIAGNOSIS — Z0000 Encounter for general adult medical examination without abnormal findings: Secondary | ICD-10-CM

## 2017-01-02 DIAGNOSIS — I69322 Dysarthria following cerebral infarction: Secondary | ICD-10-CM | POA: Diagnosis not present

## 2017-01-02 NOTE — Patient Instructions (Signed)
Sean Collins , Thank you for taking time to come for your Medicare Wellness Visit. I appreciate your ongoing commitment to your health goals. Please review the following plan we discussed and let me know if I can assist you in the future.   Screening recommendations/referrals: Colonoscopy excluded long term pt Recommended yearly ophthalmology/optometry visit for glaucoma screening and checkup Recommended yearly dental visit for hygiene and checkup  Vaccinations: Influenza vaccine due 01/06/2017 Pneumococcal vaccine 23 due, ordered Tdap vaccine due, ordered Shingles vaccine not in records  Advanced directives: Health care power of attorney in chart, need copy of living will for records  Conditions/risks identified: None  Next appointment: Dr. Linna Darner makes rounds  Preventive Care 26 Years and Older, Male Preventive care refers to lifestyle choices and visits with your health care provider that can promote health and wellness. What does preventive care include?  A yearly physical exam. This is also called an annual well check.  Dental exams once or twice a year.  Routine eye exams. Ask your health care provider how often you should have your eyes checked.  Personal lifestyle choices, including:  Daily care of your teeth and gums.  Regular physical activity.  Eating a healthy diet.  Avoiding tobacco and drug use.  Limiting alcohol use.  Practicing safe sex.  Taking low doses of aspirin every day.  Taking vitamin and mineral supplements as recommended by your health care provider. What happens during an annual well check? The services and screenings done by your health care provider during your annual well check will depend on your age, overall health, lifestyle risk factors, and family history of disease. Counseling  Your health care provider may ask you questions about your:  Alcohol use.  Tobacco use.  Drug use.  Emotional well-being.  Home and relationship  well-being.  Sexual activity.  Eating habits.  History of falls.  Memory and ability to understand (cognition).  Work and work Statistician. Screening  You may have the following tests or measurements:  Height, weight, and BMI.  Blood pressure.  Lipid and cholesterol levels. These may be checked every 5 years, or more frequently if you are over 41 years old.  Skin check.  Lung cancer screening. You may have this screening every year starting at age 54 if you have a 30-pack-year history of smoking and currently smoke or have quit within the past 15 years.  Fecal occult blood test (FOBT) of the stool. You may have this test every year starting at age 27.  Flexible sigmoidoscopy or colonoscopy. You may have a sigmoidoscopy every 5 years or a colonoscopy every 10 years starting at age 78.  Prostate cancer screening. Recommendations will vary depending on your family history and other risks.  Hepatitis C blood test.  Hepatitis B blood test.  Sexually transmitted disease (STD) testing.  Diabetes screening. This is done by checking your blood sugar (glucose) after you have not eaten for a while (fasting). You may have this done every 1-3 years.  Abdominal aortic aneurysm (AAA) screening. You may need this if you are a current or former smoker.  Osteoporosis. You may be screened starting at age 7 if you are at high risk. Talk with your health care provider about your test results, treatment options, and if necessary, the need for more tests. Vaccines  Your health care provider may recommend certain vaccines, such as:  Influenza vaccine. This is recommended every year.  Tetanus, diphtheria, and acellular pertussis (Tdap, Td) vaccine. You may need a  Td booster every 10 years.  Zoster vaccine. You may need this after age 16.  Pneumococcal 13-valent conjugate (PCV13) vaccine. One dose is recommended after age 23.  Pneumococcal polysaccharide (PPSV23) vaccine. One dose is  recommended after age 60. Talk to your health care provider about which screenings and vaccines you need and how often you need them. This information is not intended to replace advice given to you by your health care provider. Make sure you discuss any questions you have with your health care provider. Document Released: 05/21/2015 Document Revised: 01/12/2016 Document Reviewed: 02/23/2015 Elsevier Interactive Patient Education  2017 [place] Prevention in the Home Falls can cause injuries. They can happen to people of all ages. There are many things you can do to make your home safe and to help prevent falls. What can I do on the outside of my home?  Regularly fix the edges of walkways and driveways and fix any cracks.  Remove anything that might make you trip as you walk through a door, such as a raised step or threshold.  Trim any bushes or trees on the path to your home.  Use bright outdoor lighting.  Clear any walking paths of anything that might make someone trip, such as rocks or tools.  Regularly check to see if handrails are loose or broken. Make sure that both sides of any steps have handrails.  Any raised decks and porches should have guardrails on the edges.  Have any leaves, snow, or ice cleared regularly.  Use sand or salt on walking paths during winter.  Clean up any spills in your garage right away. This includes oil or grease spills. What can I do in the bathroom?  Use night lights.  Install grab bars by the toilet and in the tub and shower. Do not use towel bars as grab bars.  Use non-skid mats or decals in the tub or shower.  If you need to sit down in the shower, use a plastic, non-slip stool.  Keep the floor dry. Clean up any water that spills on the floor as soon as it happens.  Remove soap buildup in the tub or shower regularly.  Attach bath mats securely with double-sided non-slip rug tape.  Do not have throw rugs and other things on  the floor that can make you trip. What can I do in the bedroom?  Use night lights.  Make sure that you have a light by your bed that is easy to reach.  Do not use any sheets or blankets that are too big for your bed. They should not hang down onto the floor.  Have a firm chair that has side arms. You can use this for support while you get dressed.  Do not have throw rugs and other things on the floor that can make you trip. What can I do in the kitchen?  Clean up any spills right away.  Avoid walking on wet floors.  Keep items that you use a lot in easy-to-reach places.  If you need to reach something above you, use a strong step stool that has a grab bar.  Keep electrical cords out of the way.  Do not use floor polish or wax that makes floors slippery. If you must use wax, use non-skid floor wax.  Do not have throw rugs and other things on the floor that can make you trip. What can I do with my stairs?  Do not leave any items on the stairs.  Make sure that there are handrails on both sides of the stairs and use them. Fix handrails that are broken or loose. Make sure that handrails are as long as the stairways.  Check any carpeting to make sure that it is firmly attached to the stairs. Fix any carpet that is loose or worn.  Avoid having throw rugs at the top or bottom of the stairs. If you do have throw rugs, attach them to the floor with carpet tape.  Make sure that you have a light switch at the top of the stairs and the bottom of the stairs. If you do not have them, ask someone to add them for you. What else can I do to help prevent falls?  Wear shoes that:  Do not have high heels.  Have rubber bottoms.  Are comfortable and fit you well.  Are closed at the toe. Do not wear sandals.  If you use a stepladder:  Make sure that it is fully opened. Do not climb a closed stepladder.  Make sure that both sides of the stepladder are locked into place.  Ask someone to  hold it for you, if possible.  Clearly mark and make sure that you can see:  Any grab bars or handrails.  First and last steps.  Where the edge of each step is.  Use tools that help you move around (mobility aids) if they are needed. These include:  Canes.  Walkers.  Scooters.  Crutches.  Turn on the lights when you go into a dark area. Replace any light bulbs as soon as they burn out.  Set up your furniture so you have a clear path. Avoid moving your furniture around.  If any of your floors are uneven, fix them.  If there are any pets around you, be aware of where they are.  Review your medicines with your doctor. Some medicines can make you feel dizzy. This can increase your chance of falling. Ask your doctor what other things that you can do to help prevent falls. This information is not intended to replace advice given to you by your health care provider. Make sure you discuss any questions you have with your health care provider. Document Released: 02/18/2009 Document Revised: 09/30/2015 Document Reviewed: 05/29/2014 Elsevier Interactive Patient Education  2017 [employer].

## 2017-01-02 NOTE — Progress Notes (Signed)
Subjective:   Sean Collins is a 66 y.o. male who presents for an Initial Medicare Annual Wellness Visit at [place]; incapacitated patient unable to answer questions appropriately    Objective:    Today's Vitals   01/02/17 1443  BP: 120/65  Pulse: (!) 55  Temp: (!) 97.2 F (36.2 C)  TempSrc: Oral  SpO2: 94%  Weight: 166 lb (75.3 kg)  Height: 5\' 9"  (1.753 m)   Body mass index is 24.51 kg/m.  Current Medications (verified) Outpatient Encounter Prescriptions as of 01/02/2017  Medication Sig  . acetaminophen (TYLENOL) 650 MG CR tablet 650 mg every 6 (six) hours as needed for pain or fever.   Marland Kitchen allopurinol (ZYLOPRIM) 150 mg TABS tablet Place 150 mg into feeding tube daily.  Marland Kitchen amLODipine (NORVASC) 10 MG tablet Place 10 mg into feeding tube daily.  Marland Kitchen aspirin 325 MG tablet Place 325 mg into feeding tube daily.  Marland Kitchen atenolol (TENORMIN) 50 MG tablet Place 50 mg into feeding tube 2 (two) times daily.  Marland Kitchen atorvastatin (LIPITOR) 80 MG tablet Place 80 mg into feeding tube daily.   . carboxymethylcellulose (REFRESH PLUS) 0.5 % SOLN Place 1 drop into both eyes 3 (three) times daily as needed (dry eyes).  . cholecalciferol (VITAMIN D) 1000 UNITS tablet 1,000 Units daily. Give via PEG  . divalproex (DEPAKOTE) 250 MG DR tablet 250 mg 2 (two) times daily.   . enalapril (VASOTEC) 20 MG tablet Take 1 tablet (20 mg total) by mouth daily.  . folic acid (FOLVITE) 1 MG tablet Place 1 mg into feeding tube daily.   . Nutritional Supplements (ISOSOURCE 1.5 CAL) LIQD Give 2 cans three times daily bolus via PEG tub with 30 ml flushes before and after feeding and medications. This will provide 2250 Kcal per day.  Marland Kitchen QUEtiapine (SEROQUEL) 25 MG tablet Place 25 mg into feeding tube 2 (two) times daily. 1200 and 1800   . ranitidine (ZANTAC) 15 MG/ML syrup Place into feeding tube 2 (two) times daily. Take 5 mL q12h   No facility-administered encounter medications on file as of 01/02/2017.      Allergies (verified) Patient has no known allergies.   History: Past Medical History:  Diagnosis Date  . Diabetes mellitus without complication ([place])   . Dysarthria   . Dysphagia   . GI bleed   . Hyperlipidemia   . Hypertension   . Renal insufficiency 09/22/2016  . Stroke [facility])    Past Surgical History:  Procedure Laterality Date  . ESOPHAGOGASTRODUODENOSCOPY (EGD) WITH PROPOFOL N/A 11/03/2015   Procedure: ESOPHAGOGASTRODUODENOSCOPY (EGD) WITH PROPOFOL;  Surgeon: Manus Gunning, MD;  Location: [place];  Service: Gastroenterology;  Laterality: N/A;  . IR REPLC GASTRO/COLONIC TUBE PERCUT W/FLUORO  12/29/2016   Family History  Problem Relation Age of Onset  . Stroke Brother   . Diabetes Brother   . Stroke Brother   . Stroke Maternal Aunt    Social History   Occupational History  . Not on file.   Social History Main Topics  . Smoking status: Former Smoker    Packs/day: 0.50    Years: 32.00    Types: Cigarettes  . Smokeless tobacco: Never Used     Comment: Started at age 9 though quit for 10 years at one point  . Alcohol use No  . Drug use: No  . Sexual activity: Not Currently   Tobacco Counseling Counseling given: Not Answered   Activities of Daily Living In your present  state of health, do you have any difficulty performing the following activities: 01/02/2017  Hearing? N  Vision? N  Difficulty concentrating or making decisions? Y  Walking or climbing stairs? Y  Dressing or bathing? Y  Doing errands, shopping? Y  Preparing Food and eating ? Y  Using the Toilet? Y  In the past six months, have you accidently leaked urine? Y  Do you have problems with loss of bowel control? Y  Managing your Medications? Y  Managing your Finances? Y  Housekeeping or managing your Housekeeping? Y  Some recent data might be hidden    Immunizations and Health Maintenance Immunization History  Administered Date(s) Administered  . Influenza-Unspecified  06/21/2015  . PPD Test 11/08/2015   Health Maintenance Due  Topic Date Due  . TETANUS/TDAP  10/15/1969  . COLONOSCOPY  10/15/2000  . INFLUENZA VACCINE  12/06/2016    Patient Care Team: [name], MD as PCP - General (Internal Medicine) [place] as Referring Physician ([place]) Letta Pate Luanna Salk, MD as Consulting Physician (Physical Medicine and Rehabilitation)  Indicate any recent Medical Services you may have received from other than [facility] providers in the past year (date may be approximate).    Assessment:   This is a routine wellness examination for [name].   Hearing/Vision screen No exam data present  Dietary issues and exercise activities discussed: Current Exercise Habits: The patient does not participate in regular exercise at present, Exercise limited by: orthopedic condition(s)  Goals    None     Depression Screen PHQ 2/9 Scores 01/02/2017 02/07/2016  PHQ - 2 Score - 0  PHQ- 9 Score - 3  Exception Documentation Medical reason -    Fall Risk Fall Risk  01/02/2017 11/15/2016 10/13/2016 08/29/2016 08/09/2016  Falls in the past year? Yes No Yes Yes Yes  Comment - - - - -  Number falls in past yr: 2 or more - 2 or more 2 or more 2 or more  Comment - - - - -  Injury with Fall? No - No No No  Risk Factor Category  - - High Fall Risk High Fall Risk -  Risk for fall due to : - - - Impaired balance/gait;Impaired mobility;Mental status change -  Follow up - - - - -    Cognitive Function: MMSE - Mini Mental State Exam 01/02/2017  Not completed: Unable to complete        Screening Tests Health Maintenance  Topic Date Due  . TETANUS/TDAP  10/15/1969  . COLONOSCOPY  10/15/2000  . INFLUENZA VACCINE  12/06/2016  . PNA vac Low Risk Adult (1 of 2 - PCV13) 02/05/2017 (Originally 10/16/2015)  . FOOT EXAM  07/05/2018 (Originally 10/15/1960)  . Hepatitis C Screening  12/06/2023 (Originally December 29, 1950)  . OPHTHALMOLOGY EXAM  02/16/2017  . HEMOGLOBIN  A1C  03/17/2017        Plan:    I have personally reviewed and addressed the Medicare Annual Wellness questionnaire and have noted the following in the patient's chart:  A. Medical and social history B. Use of alcohol, tobacco or illicit drugs  C. Current medications and supplements D. Functional ability and status E.  Nutritional status F.  Physical activity G. Advance directives H. List of other physicians I.  Hospitalizations, surgeries, and ER visits in previous 12 months J.  [place] to include hearing, vision, cognitive, depression L. Referrals and appointments - none  In addition, I am unable to review and discuss with incapacitated  patient certain preventive protocols, quality metrics, and best practice recommendations. A written personalized care plan for preventive services as well as general preventive health recommendations were provided to patient.   See attached scanned questionnaire for additional information.   Signed,   Rich Reining, RN Nurse Health Advisor   Quick Notes   Health Maintenance: tDAP, PNA 23, foot exam, hep c screen due     Abnormal Screen: Unable to complete mental exam     Patient Concerns: none     Nurse Concerns: none I have personally reviewed the health advisor's clinical note, was available for consultation, and agree with the assessment and plan as written. [name] M.D., FACP, [facility]

## 2017-01-03 ENCOUNTER — Encounter: Payer: Self-pay | Admitting: Family

## 2017-01-03 DIAGNOSIS — I69322 Dysarthria following cerebral infarction: Secondary | ICD-10-CM | POA: Diagnosis not present

## 2017-01-04 DIAGNOSIS — I69322 Dysarthria following cerebral infarction: Secondary | ICD-10-CM | POA: Diagnosis not present

## 2017-01-05 DIAGNOSIS — I69322 Dysarthria following cerebral infarction: Secondary | ICD-10-CM | POA: Diagnosis not present

## 2017-01-08 DIAGNOSIS — I69322 Dysarthria following cerebral infarction: Secondary | ICD-10-CM | POA: Diagnosis not present

## 2017-01-08 DIAGNOSIS — R293 Abnormal posture: Secondary | ICD-10-CM | POA: Diagnosis not present

## 2017-01-09 ENCOUNTER — Encounter: Payer: Self-pay | Admitting: Adult Health

## 2017-01-09 ENCOUNTER — Non-Acute Institutional Stay (SKILLED_NURSING_FACILITY): Payer: Medicare Other | Admitting: Adult Health

## 2017-01-09 DIAGNOSIS — I69391 Dysphagia following cerebral infarction: Secondary | ICD-10-CM | POA: Diagnosis not present

## 2017-01-09 DIAGNOSIS — Z8673 Personal history of transient ischemic attack (TIA), and cerebral infarction without residual deficits: Secondary | ICD-10-CM | POA: Diagnosis not present

## 2017-01-09 DIAGNOSIS — F01518 Vascular dementia, unspecified severity, with other behavioral disturbance: Secondary | ICD-10-CM

## 2017-01-09 DIAGNOSIS — R293 Abnormal posture: Secondary | ICD-10-CM | POA: Diagnosis not present

## 2017-01-09 DIAGNOSIS — F39 Unspecified mood [affective] disorder: Secondary | ICD-10-CM

## 2017-01-09 DIAGNOSIS — Z8719 Personal history of other diseases of the digestive system: Secondary | ICD-10-CM

## 2017-01-09 DIAGNOSIS — G8114 Spastic hemiplegia affecting left nondominant side: Secondary | ICD-10-CM | POA: Diagnosis not present

## 2017-01-09 DIAGNOSIS — F323 Major depressive disorder, single episode, severe with psychotic features: Secondary | ICD-10-CM | POA: Diagnosis not present

## 2017-01-09 DIAGNOSIS — H04123 Dry eye syndrome of bilateral lacrimal glands: Secondary | ICD-10-CM | POA: Diagnosis not present

## 2017-01-09 DIAGNOSIS — I10 Essential (primary) hypertension: Secondary | ICD-10-CM | POA: Diagnosis not present

## 2017-01-09 DIAGNOSIS — M109 Gout, unspecified: Secondary | ICD-10-CM | POA: Diagnosis not present

## 2017-01-09 DIAGNOSIS — I69322 Dysarthria following cerebral infarction: Secondary | ICD-10-CM | POA: Diagnosis not present

## 2017-01-09 DIAGNOSIS — F0151 Vascular dementia with behavioral disturbance: Secondary | ICD-10-CM

## 2017-01-09 NOTE — Progress Notes (Addendum)
DATE:  01/09/2017   MRN:  [id number]  BIRTHDAY: 06/19/1950  Facility:  Nursing Home Location:  [facility] [place] Room Number: 122-A  LEVEL OF CARE:  SNF (31)  Contact Information    Name Relation Home Work Mobile   Louisa Brother [phone]         Code Status History    Date Active Date Inactive Code Status Order ID Comments User Context   10/23/2015  5:13 PM 11/08/2015  9:08 PM Full Code [id number]  Zada Finders, MD Inpatient   01/22/2015  9:01 PM 01/26/2015  2:45 PM Full Code [id number]  Lily Kocher, MD Inpatient       Chief Complaint  Patient presents with  . Medical Management of Chronic Issues    Routine visit    HISTORY OF PRESENT ILLNESS:  This is a 30-YO male seen for a routine visit.  He is a long-term care resident at [place].  He has a PMH of CVA, arthrosclerosis, CAD, HTN, DM, and HLD.  He was seen in his room today.  PEG tube was recently replaced c/o interventional radiology.     PAST MEDICAL HISTORY:  Past Medical History:  Diagnosis Date  . Diabetes mellitus without complication ([place])   . Dysarthria   . Dysphagia   . GI bleed   . Hyperlipidemia   . Hypertension   . Renal insufficiency 09/22/2016  . Stroke [facility])      CURRENT MEDICATIONS: Reviewed  Patient's Medications  New Prescriptions   No medications on file  Previous Medications   ACETAMINOPHEN (TYLENOL) 650 MG CR TABLET    650 mg every 6 (six) hours as needed for pain or fever.    ALLOPURINOL (ZYLOPRIM) 150 MG TABS TABLET    Place 150 mg into feeding tube daily.   AMLODIPINE (NORVASC) 10 MG TABLET    Place 10 mg into feeding tube daily.   ASPIRIN 325 MG TABLET    Place 325 mg into feeding tube daily.   ATENOLOL (TENORMIN) 50 MG TABLET    Place 50 mg into feeding tube 2 (two) times daily.   ATORVASTATIN (LIPITOR) 80 MG TABLET    Place 80 mg into feeding tube daily.    CARBOXYMETHYLCELLULOSE (REFRESH PLUS) 0.5 % SOLN    Place 1 drop  into both eyes 3 (three) times daily as needed (dry eyes).   CHOLECALCIFEROL (VITAMIN D) 1000 UNITS TABLET    1,000 Units daily. Give via PEG   DIVALPROEX (DEPAKOTE) 250 MG DR TABLET    250 mg 2 (two) times daily.    ENALAPRIL (VASOTEC) 20 MG TABLET    Take 1 tablet (20 mg total) by mouth daily.   FOLIC ACID (FOLVITE) 1 MG TABLET    Place 1 mg into feeding tube daily.    NUTRITIONAL SUPPLEMENTS (ISOSOURCE 1.5 CAL) LIQD    Give 2 cans three times daily bolus via PEG tub with 30 ml flushes before and after feeding and medications. This will provide 2250 Kcal per day.   QUETIAPINE (SEROQUEL) 25 MG TABLET    Place 25 mg into feeding tube 2 (two) times daily. 1200 and 1800    RANITIDINE (ZANTAC) 15 MG/ML SYRUP    Place into feeding tube 2 (two) times daily. Take 5 mL q12h  Modified Medications   No medications on file  Discontinued Medications   No medications on file    No Known Allergies   REVIEW OF SYSTEMS: unable to obtain  due to aphasia    PHYSICAL EXAMINATION  GENERAL APPEARANCE: Well nourished. In no acute distress. Normal body habitus MOUTH and THROAT: Lips are without lesions. Oral mucosa is moist and without lesions.  RESPIRATORY: breathing is even & unlabored, BS CTAB CARDIAC: RRR, no murmur,no extra heart sounds, no edema GI: abdomen soft, normal BS, no masses, no tenderness, +PEG tube on LUQ EXTREMITIES:  Able to move RUE and RLE, Left hemiplegia, left hand has splint PSYCHIATRIC:  Affect and behavior are appropriate   LABS/RADIOLOGY: Labs reviewed: Basic Metabolic Panel:  Recent Labs  04/11/16 09/14/16 09/28/16  NA 140 138 140  K 4.5 4.6 4.5  BUN 35* 55* 36*  CREATININE 1.1 1.7* 1.2   Liver Function Tests:  Recent Labs  04/11/16  AST 56*  ALT 74*  ALKPHOS 119   CBC:  Recent Labs  09/06/16 09/20/16 09/28/16  WBC 7.5 7.1 9.5  HGB 13.5 13.1* 13.0*  HCT 42 39* 40*  PLT 153 157 165   Lipid Panel:  Recent Labs  02/11/16 09/14/16  HDL 46 30*    Ir  Replc Gastro/colonic Tube Percut W/fluoro  Result Date: 12/29/2016 CLINICAL DATA:  History of gastrostomy tube placement on 11/02/2015. Request for G-tube exchange due to foul smell and poor appearance of the tube. EXAM: GASTROSTOMY CATHETER REPLACEMENT WITH FLUOROSCOPY Physician: Stephan Minister. Anselm Pancoast, MD FLUOROSCOPY TIME:  12 seconds, 1 mGy MEDICATIONS: None ANESTHESIA/SEDATION: None PROCEDURE: The existing tube was injected with contrast to confirm placement in the stomach. The gastrostomy tube track was injected with viscous lidocaine. The tube was easily removed with traction. A new 72 French balloon retention catheter was placed without complication. The balloon was inflated with 8 mL of saline. Contrast injection confirmed placement in the stomach. FINDINGS: New tube positioned in the stomach. COMPLICATIONS: None IMPRESSION: Successful exchange of the gastrostomy tube. Patient has a 4 [place] balloon retention gastrostomy tube. Electronically Signed   By: Markus Daft M.D.   On: 12/29/2016 11:32    ASSESSMENT/PLAN:  1. Essential hypertension - well-controlled, continue Atenolol 50 mg 1 tab BID, Enalapril 20 mg daily, Amlodipine 10 mg daily, check CMP   2. Left spastic hemiplegia ([place]) - continue supportive care, splint on left hand, fall precautions, follows up with Physical Medicine, last seen on 12/01/16 and had botox injections   3. History of CVA (cerebrovascular accident) - stable, continue ASA 325 mg daily, Lipitor, 80 mg Q HS, Atenolol 50 mg 1 tab BID, Enalapril 20 mg daily, Amlodipine 10 mg daily   4. Mood disorder (HCC) - mood is stable, continue Divalproex DR 125 mg give 2 capsules = 250 mg BID   5. Depressive psychosis ([place]) - continue Quetiapine 25 mg BID, followed-up by [facility] NP   6. Vascular dementia with behavior disturbance - continue supportive care, fall precautions   7. Dysphagia due to recent stroke - continue Isosource 1.5 give 2 cans via PEG tube TID, aspiration  precautions   8. H/O: upper GI bleed - no noted bleeding, continue Ranitidine  15 mg/ml syrup give 5 ml Q 12 hours, check CBC   9. Dry eyes - no complaints of eye pain, continue Refresh liquigel 1% 1 gtt to each eye TID   10. Gout, unspecified cause, unspecified chronicity, unspecified site - continue Allopurinol 150 mg 1 tab daily      Goals of care:  Long-term care     Leeza Heiner C. [place] - NP    [employer] [phone]

## 2017-01-10 DIAGNOSIS — D649 Anemia, unspecified: Secondary | ICD-10-CM | POA: Diagnosis not present

## 2017-01-10 DIAGNOSIS — I10 Essential (primary) hypertension: Secondary | ICD-10-CM | POA: Diagnosis not present

## 2017-01-10 DIAGNOSIS — I69322 Dysarthria following cerebral infarction: Secondary | ICD-10-CM | POA: Diagnosis not present

## 2017-01-10 DIAGNOSIS — R293 Abnormal posture: Secondary | ICD-10-CM | POA: Diagnosis not present

## 2017-01-10 LAB — HEPATIC FUNCTION PANEL
ALK PHOS: 126 — AB (ref 25–125)
ALT: 26 (ref 10–40)
AST: 31 (ref 14–40)
BILIRUBIN, TOTAL: 0.2

## 2017-01-10 LAB — CBC AND DIFFERENTIAL
HEMATOCRIT: 41 (ref 41–53)
Hemoglobin: 13.1 — AB (ref 13.5–17.5)
NEUTROS ABS: 3
PLATELETS: 156 (ref 150–399)
WBC: 7.7

## 2017-01-10 LAB — BASIC METABOLIC PANEL
BUN: 38 — AB (ref 4–21)
Creatinine: 1.2 (ref 0.6–1.3)
Glucose: 121
Potassium: 4.8 (ref 3.4–5.3)
SODIUM: 136 — AB (ref 137–147)

## 2017-01-11 DIAGNOSIS — I69322 Dysarthria following cerebral infarction: Secondary | ICD-10-CM | POA: Diagnosis not present

## 2017-01-11 DIAGNOSIS — R293 Abnormal posture: Secondary | ICD-10-CM | POA: Diagnosis not present

## 2017-01-12 DIAGNOSIS — I69322 Dysarthria following cerebral infarction: Secondary | ICD-10-CM | POA: Diagnosis not present

## 2017-01-12 DIAGNOSIS — R293 Abnormal posture: Secondary | ICD-10-CM | POA: Diagnosis not present

## 2017-01-15 ENCOUNTER — Ambulatory Visit (INDEPENDENT_AMBULATORY_CARE_PROVIDER_SITE_OTHER): Payer: Medicare Other | Admitting: Family

## 2017-01-15 ENCOUNTER — Encounter: Payer: Self-pay | Admitting: Family

## 2017-01-15 ENCOUNTER — Ambulatory Visit (HOSPITAL_COMMUNITY)
Admission: RE | Admit: 2017-01-15 | Discharge: 2017-01-15 | Disposition: A | Discharge status: Home / Self Care | Payer: Medicare Other | Source: Ambulatory Visit | Attending: Surgery | Admitting: Surgery

## 2017-01-15 VITALS — BP 112/62 | HR 54 | Temp 97.4°F | Resp 18 | Ht 69.0 in

## 2017-01-15 DIAGNOSIS — Z8673 Personal history of transient ischemic attack (TIA), and cerebral infarction without residual deficits: Secondary | ICD-10-CM

## 2017-01-15 DIAGNOSIS — I69322 Dysarthria following cerebral infarction: Secondary | ICD-10-CM | POA: Diagnosis not present

## 2017-01-15 DIAGNOSIS — I6523 Occlusion and stenosis of bilateral carotid arteries: Secondary | ICD-10-CM | POA: Insufficient documentation

## 2017-01-15 DIAGNOSIS — Z993 Dependence on wheelchair: Secondary | ICD-10-CM | POA: Insufficient documentation

## 2017-01-15 DIAGNOSIS — Z87891 Personal history of nicotine dependence: Secondary | ICD-10-CM

## 2017-01-15 DIAGNOSIS — R293 Abnormal posture: Secondary | ICD-10-CM | POA: Diagnosis not present

## 2017-01-15 NOTE — Patient Instructions (Signed)
Stroke Prevention Some medical conditions and behaviors are associated with an increased chance of having a stroke. You may prevent a stroke by making healthy choices and managing medical conditions. How can I reduce my risk of having a stroke?  Stay physically active. Get at least 30 minutes of activity on most or all days.  Do not smoke. It may also be helpful to avoid exposure to secondhand smoke.  Limit alcohol use. Moderate alcohol use is considered to be: ? No more than 2 drinks per day for men. ? No more than 1 drink per day for nonpregnant women.  Eat healthy foods. This involves: ? Eating 5 or more servings of fruits and vegetables a day. ? Making dietary changes that address high blood pressure (hypertension), high cholesterol, diabetes, or obesity.  Manage your cholesterol levels. ? Making food choices that are high in fiber and low in saturated fat, trans fat, and cholesterol may control cholesterol levels. ? Take any prescribed medicines to control cholesterol as directed by your health care provider.  Manage your diabetes. ? Controlling your carbohydrate and sugar intake is recommended to manage diabetes. ? Take any prescribed medicines to control diabetes as directed by your health care provider.  Control your hypertension. ? Making food choices that are low in salt (sodium), saturated fat, trans fat, and cholesterol is recommended to manage hypertension. ? Ask your health care provider if you need treatment to lower your blood pressure. Take any prescribed medicines to control hypertension as directed by your health care provider. ? If you are 18-39 years of age, have your blood pressure checked every 3-5 years. If you are 40 years of age or older, have your blood pressure checked every year.  Maintain a healthy weight. ? Reducing calorie intake and making food choices that are low in sodium, saturated fat, trans fat, and cholesterol are recommended to manage  weight.  Stop drug abuse.  Avoid taking birth control pills. ? Talk to your health care provider about the risks of taking birth control pills if you are over 35 years old, smoke, get migraines, or have ever had a blood clot.  Get evaluated for sleep disorders (sleep apnea). ? Talk to your health care provider about getting a sleep evaluation if you snore a lot or have excessive sleepiness.  Take medicines only as directed by your health care provider. ? For some people, aspirin or blood thinners (anticoagulants) are helpful in reducing the risk of forming abnormal blood clots that can lead to stroke. If you have the irregular heart rhythm of atrial fibrillation, you should be on a blood thinner unless there is a good reason you cannot take them. ? Understand all your medicine instructions.  Make sure that other conditions (such as anemia or atherosclerosis) are addressed. Get help right away if:  You have sudden weakness or numbness of the face, arm, or leg, especially on one side of the body.  Your face or eyelid droops to one side.  You have sudden confusion.  You have trouble speaking (aphasia) or understanding.  You have sudden trouble seeing in one or both eyes.  You have sudden trouble walking.  You have dizziness.  You have a loss of balance or coordination.  You have a sudden, severe headache with no known cause.  You have new chest pain or an irregular heartbeat. Any of these symptoms may represent a serious problem that is an emergency. Do not wait to see if the symptoms will go away.   Get medical help at once. Call your local emergency services (911 in [place] Do not drive yourself to the hospital. This information is not intended to replace advice given to you by your health care provider. Make sure you discuss any questions you have with your health care provider. Document Released: 06/01/2004 Document Revised: 09/30/2015 Document Reviewed: 10/25/2012 Elsevier  Interactive Patient Education  2017 Elsevier Inc.     Preventing Cerebrovascular Disease Arteries are blood vessels that carry blood that contains oxygen from the heart to all parts of the body. Cerebrovascular disease affects arteries that supply the brain. Any condition that blocks or disrupts blood flow to the brain can cause cerebrovascular disease. Brain cells that lose blood supply start to die within minutes (stroke). Stroke is the main danger of cerebrovascular disease. Atherosclerosis and high blood pressure are common causes of cerebrovascular disease. Atherosclerosis is narrowing and hardening of an artery that results when fat, cholesterol, calcium, or other substances (plaque) build up inside an artery. Plaque reduces blood flow through the artery. High blood pressure increases the risk of bleeding inside the brain. Making diet and lifestyle changes to prevent atherosclerosis and high blood pressure lowers your risk of cerebrovascular disease. What nutrition changes can be made?  Eat more fruits, vegetables, and whole grains.  Reduce how much saturated fat you eat. To do this, eat less red meat and fewer full-fat dairy products.  Eat healthy proteins instead of red meat. Healthy proteins include: ? Fish. Eat fish that contains heart-healthy omega-3 fatty acids, twice a week. Examples include salmon, albacore tuna, mackerel, and herring. ? Chicken. ? Nuts. ? Low-fat or nonfat yogurt.  Avoid processed meats, like bacon and lunchmeat.  Avoid foods that contain: ? A lot of sugar, such as sweets and drinks with added sugar. ? A lot of salt (sodium). Avoid adding extra salt to your food, as told by your health care provider. ? Trans fats, such as margarine and baked goods. Trans fats may be listed as "partially hydrogenated oils" on food labels.  Check food labels to see how much sodium, sugar, and trans fats are in foods.  Use vegetable oils that contain low amounts of  saturated fat, such as olive oil or canola oil. What lifestyle changes can be made?  Drink alcohol in moderation. This means no more than 1 drink a day for nonpregnant women and 2 drinks a day for men. One drink equals 12 oz of beer, 5 oz of wine, or 1 oz of hard liquor.  If you are overweight, ask your health care provider to recommend a weight-loss plan for you. Losing 5-10 lb (2.2-4.5 kg) can reduce your risk of diabetes, atherosclerosis, and high blood pressure.  Exercise for 30?60 minutes on most days, or as much as told by your health care provider. ? Do moderate-intensity exercise, such as brisk walking, bicycling, and water aerobics. Ask your health care provider which activities are safe for you.  Do not use any products that contain nicotine or tobacco, such as cigarettes and e-cigarettes. If you need help quitting, ask your health care provider. Why are these changes important? Making these changes lowers your risk of many diseases that can cause cerebrovascular disease and stroke. Stroke is a leading cause of death and disability. Making these changes also improves your overall health and quality of life. What can I do to lower my risk? The following factors make you more likely to develop cerebrovascular disease:  Being overweight.  Smoking.  Being physically inactive.    Eating a high-fat diet.  Having certain health conditions, such as: ? Diabetes. ? High blood pressure. ? Heart disease. ? Atherosclerosis. ? High cholesterol. ? Sickle cell disease.  Talk with your health care provider about your risk for cerebrovascular disease. Work with your health care provider to control diseases that you have that may contribute to cerebrovascular disease. Your health care provider may prescribe medicines to help prevent major causes of cerebrovascular disease. Where to find more information: Learn more about preventing cerebrovascular disease from:  National Heart, Lung, and  Blood Institute: www.nhlbi.nih.gov/health/health-topics/topics/stroke  Centers for Disease Control and Prevention: cdc.gov/stroke/about.htm  Summary  Cerebrovascular disease can lead to a stroke.  Atherosclerosis and high blood pressure are major causes of cerebrovascular disease.  Making diet and lifestyle changes can reduce your risk of cerebrovascular disease.  Work with your health care provider to get your risk factors under control to reduce your risk of cerebrovascular disease. This information is not intended to replace advice given to you by your health care provider. Make sure you discuss any questions you have with your health care provider. Document Released: 05/09/2015 Document Revised: 11/12/2015 Document Reviewed: 05/09/2015 Elsevier Interactive Patient Education  2018 Elsevier Inc.  

## 2017-01-15 NOTE — Progress Notes (Signed)
Chief Complaint: Follow up Extracranial Carotid Artery Stenosis   History of Present Illness  Sean Collins is a 66 y.o. male who is s/p CVA in June 2017 with mild extracranial carotid artery stenosis. He remains with a complete left hemiplegia. He is no longer able to speak. He currently resides in a skilled nursing facility at [facility]. He is not able to ambulate.  Brother states that pt has no wounds on his feet.   The patient's brother states that pt had had a stroke in the past but was unable to describe his symptoms. Per chart review, the patient experienced similar symptoms during his previous stroke.   The patient has a past medical history of hypertension, diabetes GI bleed and hyperlipidemia.  These are currently stable.   Dr. Oneida Alar last evaluated pt on 01-12-17. At that time bilateral carotid duplex showed less than 40% stenosis bilaterally.  Overall patient remains very debilitated from his prior stroke. Dr. Oneida Alar advised pt to follow-up in one year. If he remains less than 40% stenosis at that time, he most likely will not require further follow-up   Patient has not had previous carotid artery intervention.  Pt Diabetic: yes, 5.9 A1C on 09-14-16 Pt smoker: former smoker, quit in June 2017, started smoking about age 59, quit several times.   Pt meds include: Statin : yes ASA: yes Other anticoagulants/antiplatelets: no   Past Medical History:  Diagnosis Date  . Diabetes mellitus without complication ([place])   . Dysarthria   . Dysphagia   . GI bleed   . Hyperlipidemia   . Hypertension   . Renal insufficiency 09/22/2016  . Stroke [facility])     Social History Social History  Substance Use Topics  . Smoking status: Former Smoker    Packs/day: 0.50    Years: 32.00    Types: Cigarettes  . Smokeless tobacco: Never Used     Comment: Started at age 51 though quit for 10 years at one point  . Alcohol use No    Family History Family History  Problem  Relation Age of Onset  . Stroke Brother   . Diabetes Brother   . Stroke Brother   . Stroke Maternal Aunt     Surgical History Past Surgical History:  Procedure Laterality Date  . ESOPHAGOGASTRODUODENOSCOPY (EGD) WITH PROPOFOL N/A 11/03/2015   Procedure: ESOPHAGOGASTRODUODENOSCOPY (EGD) WITH PROPOFOL;  Surgeon: Manus Gunning, MD;  Location: [facility];  Service: Gastroenterology;  Laterality: N/A;  . IR REPLC GASTRO/COLONIC TUBE PERCUT W/FLUORO  12/29/2016    No Known Allergies  Current Outpatient Prescriptions  Medication Sig Dispense Refill  . acetaminophen (TYLENOL) 650 MG CR tablet 650 mg every 6 (six) hours as needed for pain or fever.     Mar[place] Kitchen allopurinol (ZYLOPRIM) 150 mg TABS tablet Place 150 mg into feeding tube daily.    [place] Kitchen amLODipine (NORVASC) 10 MG tablet Place 10 mg into feeding tube daily.    [place] Kitchen aspirin 325 MG tablet Place 325 mg into feeding tube daily.    [place] Kitchen atenolol (TENORMIN) 50 MG tablet Place 50 mg into feeding tube 2 (two) times daily.    [place] Kitchen atorvastatin (LIPITOR) 80 MG tablet Place 80 mg into feeding tube daily.     . carboxymethylcellulose (REFRESH PLUS) 0.5 % SOLN Place 1 drop into both eyes 3 (three) times daily as needed (dry eyes).    . cholecalciferol (VITAMIN D) 1000 UNITS tablet 1,000 Units daily. Give via PEG    . divalproex (DEPAKOTE) 250  MG DR tablet 250 mg 2 (two) times daily.     . enalapril (VASOTEC) 20 MG tablet Take 1 tablet (20 mg total) by mouth daily. 30 tablet 2  . folic acid (FOLVITE) 1 MG tablet Place 1 mg into feeding tube daily.     . Nutritional Supplements (ISOSOURCE 1.5 CAL) LIQD Give 2 cans three times daily bolus via PEG tub with 30 ml flushes before and after feeding and medications. This will provide 2250 Kcal per day.    [place] Kitchen QUEtiapine (SEROQUEL) 25 MG tablet Place 25 mg into feeding tube 2 (two) times daily. 1200 and 1800     . ranitidine (ZANTAC) 15 MG/ML syrup Place into feeding tube 2 (two) times daily. Take 5 mL q12h     No  current facility-administered medications for this visit.     Review of Systems : See HPI for pertinent positives and negatives.  Physical Examination  Vitals:   01/15/17 1412  BP: 112/62  Pulse: (!) 54  Resp: 18  Temp: (!) 97.4 F (36.3 C)  TempSrc: Oral  SpO2: 99%  Height: 5\' 9"  (1.753 m)   There is no height or weight on file to calculate BMI.  General: WDWN male in NAD GAIT: seated in his w/c Eyes: PERRLA Pulmonary:  Respirations are non-labored, good air movement, CTAB, no rales, rhonchi, or wheezing.  Cardiac: regular rhythm, no detected murmur.  VASCULAR EXAM Carotid Bruits Right Left   Negative Negative     Abdominal aortic pulse is not palpable. Radial pulses are 1+ palpable and equal.                                                                                                                            LE Pulses Right Left       POPLITEAL  not palpable   not palpable       POSTERIOR TIBIAL  not palpable   not palpable        DORSALIS PEDIS      ANTERIOR TIBIAL not palpable  not palpable     Gastrointestinal: soft, nontender, BS WNL, no r/g,  no palpable masses.  Musculoskeletal: Mild muscle atrophy/wasting in left upper and lower extremities. M/S 5/5 on right UE and LE, flaccid in left UE and LE, extremities without ischemic changes. No contractures noted.   Neurologic:  A&O X ?; affect is flat, sensation is normal on the right diminished on the left; speech is unintelligible, mostly noises, CN 2-12: left side facial droop, pt is holding towel to his mouth (brother states he drools), pain and light touch intact in extremities except diminished on the left, motor exam as listed above. He follows commands appropriately.    Assessment: Sean Collins is a 66 y.o. male who presents s/p stroke in June 2017. He has left hemiplegia. His is unable to ambulate. He is unable to articulate words, he makes noises when trying to speak. He reside in a  nursing facility.  His DM is in good control, he smoked from age 70 until June of 2017.   DATA Carotid Duplex: Technically difficult and limited exam due to patient's limited mobility; scan was performed in w/c, pt unable to lift chin.  Right ICA: 1-39% stenosis. Left ICA: could not be interrogated.  Bilateral vertebral artery flow is antegrade.  Bilateral subclavian artery waveforms are normal.  No significant change of the right ICA compared to the exam on 01-13-16.    Plan: Follow-up in 1 year with Carotid Duplex scan.   I discussed in depth with the patient the nature of atherosclerosis, and emphasized the importance of maximal medical management including strict control of blood pressure, blood glucose, and lipid levels, obtaining regular exercise, and continued cessation of smoking.  The patient is aware that without maximal medical management the underlying atherosclerotic disease process will progress, limiting the benefit of any interventions. The patient was given information about stroke prevention and what symptoms should prompt the patient to seek immediate medical care. Thank you for allowing [place] to participate in this patient's care.  Clemon Chambers, RN, MSN, FNP-C [facility] Office: [facility] Physician: Trula Slade  01/15/17 2:22 PM

## 2017-01-16 DIAGNOSIS — I69322 Dysarthria following cerebral infarction: Secondary | ICD-10-CM | POA: Diagnosis not present

## 2017-01-16 DIAGNOSIS — R293 Abnormal posture: Secondary | ICD-10-CM | POA: Diagnosis not present

## 2017-01-17 DIAGNOSIS — R293 Abnormal posture: Secondary | ICD-10-CM | POA: Diagnosis not present

## 2017-01-17 DIAGNOSIS — I69322 Dysarthria following cerebral infarction: Secondary | ICD-10-CM | POA: Diagnosis not present

## 2017-01-18 DIAGNOSIS — R293 Abnormal posture: Secondary | ICD-10-CM | POA: Diagnosis not present

## 2017-01-18 DIAGNOSIS — I69322 Dysarthria following cerebral infarction: Secondary | ICD-10-CM | POA: Diagnosis not present

## 2017-01-19 DIAGNOSIS — R293 Abnormal posture: Secondary | ICD-10-CM | POA: Diagnosis not present

## 2017-01-19 DIAGNOSIS — I69322 Dysarthria following cerebral infarction: Secondary | ICD-10-CM | POA: Diagnosis not present

## 2017-01-22 DIAGNOSIS — I69322 Dysarthria following cerebral infarction: Secondary | ICD-10-CM | POA: Diagnosis not present

## 2017-01-22 DIAGNOSIS — R293 Abnormal posture: Secondary | ICD-10-CM | POA: Diagnosis not present

## 2017-01-24 DIAGNOSIS — R293 Abnormal posture: Secondary | ICD-10-CM | POA: Diagnosis not present

## 2017-01-24 DIAGNOSIS — F39 Unspecified mood [affective] disorder: Secondary | ICD-10-CM | POA: Diagnosis not present

## 2017-01-24 DIAGNOSIS — I69322 Dysarthria following cerebral infarction: Secondary | ICD-10-CM | POA: Diagnosis not present

## 2017-01-24 DIAGNOSIS — F339 Major depressive disorder, recurrent, unspecified: Secondary | ICD-10-CM | POA: Diagnosis not present

## 2017-01-24 DIAGNOSIS — F419 Anxiety disorder, unspecified: Secondary | ICD-10-CM | POA: Diagnosis not present

## 2017-01-24 DIAGNOSIS — G3184 Mild cognitive impairment, so stated: Secondary | ICD-10-CM | POA: Diagnosis not present

## 2017-01-25 DIAGNOSIS — R293 Abnormal posture: Secondary | ICD-10-CM | POA: Diagnosis not present

## 2017-01-25 DIAGNOSIS — I69322 Dysarthria following cerebral infarction: Secondary | ICD-10-CM | POA: Diagnosis not present

## 2017-01-26 DIAGNOSIS — R293 Abnormal posture: Secondary | ICD-10-CM | POA: Diagnosis not present

## 2017-01-26 DIAGNOSIS — I69322 Dysarthria following cerebral infarction: Secondary | ICD-10-CM | POA: Diagnosis not present

## 2017-01-29 DIAGNOSIS — R293 Abnormal posture: Secondary | ICD-10-CM | POA: Diagnosis not present

## 2017-01-29 DIAGNOSIS — I69322 Dysarthria following cerebral infarction: Secondary | ICD-10-CM | POA: Diagnosis not present

## 2017-01-30 DIAGNOSIS — R293 Abnormal posture: Secondary | ICD-10-CM | POA: Diagnosis not present

## 2017-01-30 DIAGNOSIS — I69322 Dysarthria following cerebral infarction: Secondary | ICD-10-CM | POA: Diagnosis not present

## 2017-01-31 DIAGNOSIS — I69322 Dysarthria following cerebral infarction: Secondary | ICD-10-CM | POA: Diagnosis not present

## 2017-01-31 DIAGNOSIS — R293 Abnormal posture: Secondary | ICD-10-CM | POA: Diagnosis not present

## 2017-02-01 DIAGNOSIS — R293 Abnormal posture: Secondary | ICD-10-CM | POA: Diagnosis not present

## 2017-02-01 DIAGNOSIS — I69322 Dysarthria following cerebral infarction: Secondary | ICD-10-CM | POA: Diagnosis not present

## 2017-02-02 DIAGNOSIS — R293 Abnormal posture: Secondary | ICD-10-CM | POA: Diagnosis not present

## 2017-02-02 DIAGNOSIS — I69322 Dysarthria following cerebral infarction: Secondary | ICD-10-CM | POA: Diagnosis not present

## 2017-02-03 DIAGNOSIS — R293 Abnormal posture: Secondary | ICD-10-CM | POA: Diagnosis not present

## 2017-02-03 DIAGNOSIS — I69322 Dysarthria following cerebral infarction: Secondary | ICD-10-CM | POA: Diagnosis not present

## 2017-02-05 ENCOUNTER — Non-Acute Institutional Stay (SKILLED_NURSING_FACILITY): Payer: Medicare Other | Admitting: Adult Health

## 2017-02-05 ENCOUNTER — Encounter: Payer: Self-pay | Admitting: Adult Health

## 2017-02-05 DIAGNOSIS — I10 Essential (primary) hypertension: Secondary | ICD-10-CM

## 2017-02-05 DIAGNOSIS — F0151 Vascular dementia with behavioral disturbance: Secondary | ICD-10-CM | POA: Diagnosis not present

## 2017-02-05 DIAGNOSIS — G8114 Spastic hemiplegia affecting left nondominant side: Secondary | ICD-10-CM

## 2017-02-05 DIAGNOSIS — Z8673 Personal history of transient ischemic attack (TIA), and cerebral infarction without residual deficits: Secondary | ICD-10-CM | POA: Diagnosis not present

## 2017-02-05 DIAGNOSIS — I69391 Dysphagia following cerebral infarction: Secondary | ICD-10-CM

## 2017-02-05 DIAGNOSIS — M109 Gout, unspecified: Secondary | ICD-10-CM

## 2017-02-05 DIAGNOSIS — F39 Unspecified mood [affective] disorder: Secondary | ICD-10-CM

## 2017-02-05 DIAGNOSIS — R471 Dysarthria and anarthria: Secondary | ICD-10-CM | POA: Diagnosis not present

## 2017-02-05 DIAGNOSIS — F323 Major depressive disorder, single episode, severe with psychotic features: Secondary | ICD-10-CM | POA: Diagnosis not present

## 2017-02-05 DIAGNOSIS — R293 Abnormal posture: Secondary | ICD-10-CM | POA: Diagnosis not present

## 2017-02-05 DIAGNOSIS — F01518 Vascular dementia, unspecified severity, with other behavioral disturbance: Secondary | ICD-10-CM

## 2017-02-05 DIAGNOSIS — I69322 Dysarthria following cerebral infarction: Secondary | ICD-10-CM | POA: Diagnosis not present

## 2017-02-05 NOTE — Progress Notes (Signed)
DATE:  02/05/2017   MRN:  [id number]  BIRTHDAY: [birth year]  Facility:  Nursing Home Location:  [facility] Room Number: 122-A  LEVEL OF CARE:  SNF (31)  Contact Information    Name Relation Home Work Mobile   [place] Brother [phone]         Code Status History    Date Active Date Inactive Code Status Order ID Comments User Context   10/23/2015  5:13 PM 11/08/2015  9:08 PM Full Code [id number]  Zada Finders, MD Inpatient   01/22/2015  9:01 PM 01/26/2015  2:45 PM Full Code [id number]  Lily Kocher, MD Inpatient       Chief Complaint  Patient presents with  . Medical Management of Chronic Issues    Routine visit    HISTORY OF PRESENT ILLNESS:  This is a 74-YO male seen for a routine visit.  He is a long-term care resident of [facility].  He has a PMH of CVA, artherosclerosis, CAD, DM, HTN, and HLD. He was seen in the room today. CNA at bedside getting ready to shave his beard. He is currently having OT and ST for rehabilitation. His PEG tube has been changed     PAST MEDICAL HISTORY:  Past Medical History:  Diagnosis Date  . Diabetes mellitus without complication ([place])   . Dysarthria   . Dysphagia   . GI bleed   . Hyperlipidemia   . Hypertension   . Renal insufficiency 09/22/2016  . Stroke [facility])      CURRENT MEDICATIONS: Reviewed  Patient's Medications  New Prescriptions   No medications on file  Previous Medications   ACETAMINOPHEN (TYLENOL) 650 MG CR TABLET    650 mg every 6 (six) hours as needed for pain or fever.    ALLOPURINOL (ZYLOPRIM) 150 MG TABS TABLET    Place 150 mg into feeding tube daily.   AMLODIPINE (NORVASC) 10 MG TABLET    Place 10 mg into feeding tube daily.   ASPIRIN 325 MG TABLET    Place 325 mg into feeding tube daily.   ATENOLOL (TENORMIN) 50 MG TABLET    Place 50 mg into feeding tube 2 (two) times daily.   ATORVASTATIN (LIPITOR) 80 MG TABLET    Place 80 mg into feeding tube daily.     CARBOXYMETHYLCELLULOSE (REFRESH PLUS) 0.5 % SOLN    Place 1 drop into both eyes 3 (three) times daily as needed (dry eyes).   CHOLECALCIFEROL (VITAMIN D) 1000 UNITS TABLET    1,000 Units daily. Give via PEG   DIVALPROEX (DEPAKOTE) 250 MG DR TABLET    250 mg 2 (two) times daily.    ENALAPRIL (VASOTEC) 20 MG TABLET    Take 1 tablet (20 mg total) by mouth daily.   FOLIC ACID (FOLVITE) 1 MG TABLET    Place 1 mg into feeding tube daily.    NUTRITIONAL SUPPLEMENTS (ISOSOURCE 1.5 CAL) LIQD    Give 2 cans three times daily bolus via PEG tub with 30 ml flushes before and after feeding and medications. This will provide 2250 Kcal per day.   QUETIAPINE (SEROQUEL) 25 MG TABLET    Place 25 mg into feeding tube 2 (two) times daily. 1200 and 1800    RANITIDINE (ZANTAC) 15 MG/ML SYRUP    Place into feeding tube 2 (two) times daily. Take 5 mL q12h  Modified Medications   No medications on file  Discontinued Medications   No medications on file  No Known Allergies   REVIEW OF SYSTEMS:  Unable to obtain due to dysarthria   PHYSICAL EXAMINATION  GENERAL APPEARANCE: Well nourished. In no acute distress. Normal body habitus SKIN:  Skin is warm and dry.  MOUTH and THROAT: Lips are without lesions. Oral mucosa is moist and without lesions. RESPIRATORY: breathing is even & unlabored, BS CTAB CARDIAC: RRR, no murmur,no extra heart sounds, no edema GI: abdomen soft, normal BS, no masses, no tenderness, + PEG tube on LUQ EXTREMITIES:  Able to move RUE and RLE NEUROLOGICAL:  Aphasic, Left hemiplegia PSYCHIATRIC: Affect and behavior are appropriate    LABS/RADIOLOGY: Labs reviewed: Basic Metabolic Panel:  Recent Labs  09/14/16 09/28/16 01/10/17  NA 138 140 136*  K 4.6 4.5 4.8  BUN 55* 36* 38*  CREATININE 1.7* 1.2 1.2   Liver Function Tests:  Recent Labs  04/11/16 01/10/17  AST 56* 31  ALT 74* 26  ALKPHOS 119 126*   CBC:  Recent Labs  09/20/16 09/28/16 01/10/17  WBC 7.1 9.5 7.7    NEUTROABS  --   --  3  HGB 13.1* 13.0* 13.1*  HCT 39* 40* 41  PLT 157 165 156   Lipid Panel:  Recent Labs  02/11/16 09/14/16  HDL 46 30*    ASSESSMENT/PLAN:  1. Dysphagia due to recent stroke - his PEG tube was recently replaced,  continue Isosource 1.5  cans 3 times a day via PEG tube, aspiration precaution   2. Left spastic hemiplegia ([place]) - currently having OT for wheelchair management/propulsion, fall precautions   3. History of CVA (cerebrovascular accident)  -  Stable, continue aspirin 325 mg daily, atenolol 50 mg 1 tab twice a day, enalapril maleate 20 mg 1 tab daily, amlodipine 10 mg 1 tab daily, and Lipitor 80 mg 1 tab daily   4. Essential hypertension - well controlled, continue atenolol 50 mg 1 tab twice a day, enalapril maleate 20 mg 1 tab daily, amlodipine 10 mg 1 tab daily,    5. Mood disorder (HCC) - mood this is stable, continue divalproex DR 125 mg daily 2 capsules = 250 mg twice a day   6. Vascular dementia with behavior disturbance - continue supportive care, aspiration precaution   7. Depressive psychosis ([place]) - stable, continue Quetiapine 25 mg 1 tab twice a day, followed up by [facility] NP  8. Gout, unspecified cause, unspecified chronicity, unspecified site - stable,  conitnue Allopurinol 150 mg daily  9. Dysarthria -  Currently having speech therapy for communication and auditory processing      Goals of care:  Long-term care     Monina C. Cobre - NP   [employer] [phone]

## 2017-02-06 DIAGNOSIS — I69322 Dysarthria following cerebral infarction: Secondary | ICD-10-CM | POA: Diagnosis not present

## 2017-02-06 DIAGNOSIS — R293 Abnormal posture: Secondary | ICD-10-CM | POA: Diagnosis not present

## 2017-02-07 DIAGNOSIS — I69322 Dysarthria following cerebral infarction: Secondary | ICD-10-CM | POA: Diagnosis not present

## 2017-02-07 DIAGNOSIS — R293 Abnormal posture: Secondary | ICD-10-CM | POA: Diagnosis not present

## 2017-02-07 NOTE — Addendum Note (Signed)
Addended by: Lianne Cure A on: 02/07/2017 12:40 PM   Modules accepted: Orders

## 2017-02-08 DIAGNOSIS — R293 Abnormal posture: Secondary | ICD-10-CM | POA: Diagnosis not present

## 2017-02-08 DIAGNOSIS — I69322 Dysarthria following cerebral infarction: Secondary | ICD-10-CM | POA: Diagnosis not present

## 2017-02-09 DIAGNOSIS — R293 Abnormal posture: Secondary | ICD-10-CM | POA: Diagnosis not present

## 2017-02-09 DIAGNOSIS — I69322 Dysarthria following cerebral infarction: Secondary | ICD-10-CM | POA: Diagnosis not present

## 2017-02-12 DIAGNOSIS — I69322 Dysarthria following cerebral infarction: Secondary | ICD-10-CM | POA: Diagnosis not present

## 2017-02-12 DIAGNOSIS — R293 Abnormal posture: Secondary | ICD-10-CM | POA: Diagnosis not present

## 2017-02-13 DIAGNOSIS — I69322 Dysarthria following cerebral infarction: Secondary | ICD-10-CM | POA: Diagnosis not present

## 2017-02-13 DIAGNOSIS — R293 Abnormal posture: Secondary | ICD-10-CM | POA: Diagnosis not present

## 2017-02-14 DIAGNOSIS — I69322 Dysarthria following cerebral infarction: Secondary | ICD-10-CM | POA: Diagnosis not present

## 2017-02-14 DIAGNOSIS — R293 Abnormal posture: Secondary | ICD-10-CM | POA: Diagnosis not present

## 2017-02-15 DIAGNOSIS — R293 Abnormal posture: Secondary | ICD-10-CM | POA: Diagnosis not present

## 2017-02-15 DIAGNOSIS — I69322 Dysarthria following cerebral infarction: Secondary | ICD-10-CM | POA: Diagnosis not present

## 2017-02-16 DIAGNOSIS — I69322 Dysarthria following cerebral infarction: Secondary | ICD-10-CM | POA: Diagnosis not present

## 2017-02-16 DIAGNOSIS — R293 Abnormal posture: Secondary | ICD-10-CM | POA: Diagnosis not present

## 2017-02-19 DIAGNOSIS — I69322 Dysarthria following cerebral infarction: Secondary | ICD-10-CM | POA: Diagnosis not present

## 2017-02-19 DIAGNOSIS — R293 Abnormal posture: Secondary | ICD-10-CM | POA: Diagnosis not present

## 2017-02-20 DIAGNOSIS — I69322 Dysarthria following cerebral infarction: Secondary | ICD-10-CM | POA: Diagnosis not present

## 2017-02-20 DIAGNOSIS — R293 Abnormal posture: Secondary | ICD-10-CM | POA: Diagnosis not present

## 2017-02-22 DIAGNOSIS — I69322 Dysarthria following cerebral infarction: Secondary | ICD-10-CM | POA: Diagnosis not present

## 2017-02-22 DIAGNOSIS — R293 Abnormal posture: Secondary | ICD-10-CM | POA: Diagnosis not present

## 2017-02-23 ENCOUNTER — Encounter: Payer: Self-pay | Admitting: Adult Health

## 2017-02-23 ENCOUNTER — Non-Acute Institutional Stay (SKILLED_NURSING_FACILITY): Payer: Medicare Other | Admitting: Adult Health

## 2017-02-23 DIAGNOSIS — R05 Cough: Secondary | ICD-10-CM

## 2017-02-23 DIAGNOSIS — R0989 Other specified symptoms and signs involving the circulatory and respiratory systems: Secondary | ICD-10-CM

## 2017-02-23 DIAGNOSIS — R059 Cough, unspecified: Secondary | ICD-10-CM

## 2017-02-23 DIAGNOSIS — R0902 Hypoxemia: Secondary | ICD-10-CM | POA: Diagnosis not present

## 2017-02-23 NOTE — Progress Notes (Signed)
DATE:  02/23/2017   MRN:  [id number]  BIRTHDAY: 1951/03/06  Facility:  Nursing Home Location:  [facility] Room Number: 122-A  LEVEL OF CARE:  SNF (31)  Contact Information    Name Relation Home Work Mobile   [place] Brother [phone]         Code Status History    Date Active Date Inactive Code Status Order ID Comments User Context   10/23/2015  5:13 PM 11/08/2015  9:08 PM Full Code [id number]  Zada Finders, MD Inpatient   01/22/2015  9:01 PM 01/26/2015  2:45 PM Full Code [id number]  Lily Kocher, MD Inpatient       Chief Complaint  Patient presents with  . Acute Visit    Cough and congestion    HISTORY OF PRESENT ILLNESS:  This is a 55-YO male seen for an acute visit secondary to cough and congestion.  He is a long-term care resident of [facility].  He has a PMH of CVA, atherosclerosis, CAD, DM, HTN, and HLD. He was seen today in his room. He was noted to have rales on bilateral lung fields and productive cough. No fever has been reported. He has Hx of CVA, dysphagia, has right arm hemiplegia and has PEG tube.     PAST MEDICAL HISTORY:  Past Medical History:  Diagnosis Date  . Diabetes mellitus without complication ([place])   . Dysarthria   . Dysphagia   . GI bleed   . Hyperlipidemia   . Hypertension   . Renal insufficiency 09/22/2016  . Stroke [facility])      CURRENT MEDICATIONS: Reviewed  Patient's Medications  New Prescriptions   No medications on file  Previous Medications   ACETAMINOPHEN (TYLENOL) 650 MG CR TABLET    650 mg every 6 (six) hours as needed for pain or fever.    ALLOPURINOL (ZYLOPRIM) 150 MG TABS TABLET    Place 150 mg into feeding tube daily.   AMLODIPINE (NORVASC) 10 MG TABLET    Place 10 mg into feeding tube daily.   ASPIRIN 325 MG TABLET    Place 325 mg into feeding tube daily.   ATENOLOL (TENORMIN) 50 MG TABLET    Place 50 mg into feeding tube 2 (two) times daily.   ATORVASTATIN  (LIPITOR) 80 MG TABLET    Place 80 mg into feeding tube daily.    CARBOXYMETHYLCELLULOSE (REFRESH PLUS) 0.5 % SOLN    Place 1 drop into both eyes 3 (three) times daily as needed (dry eyes).   CHOLECALCIFEROL (VITAMIN D) 1000 UNITS TABLET    1,000 Units daily. Give via PEG   DIVALPROEX (DEPAKOTE) 250 MG DR TABLET    250 mg 2 (two) times daily.    ENALAPRIL (VASOTEC) 20 MG TABLET    Take 1 tablet (20 mg total) by mouth daily.   FOLIC ACID (FOLVITE) 1 MG TABLET    Place 1 mg into feeding tube daily.    NUTRITIONAL SUPPLEMENTS (ISOSOURCE 1.5 CAL) LIQD    Give 2 cans three times daily bolus via PEG tub with 30 ml flushes before and after feeding and medications. This will provide 2250 Kcal per day.   QUETIAPINE (SEROQUEL) 25 MG TABLET    Place 25 mg into feeding tube 2 (two) times daily. 1200 and 1800    RANITIDINE (ZANTAC) 15 MG/ML SYRUP    Place into feeding tube 2 (two) times daily. Take 5 mL q12h  Modified Medications   No medications  on file  Discontinued Medications   No medications on file     No Known Allergies   REVIEW OF SYSTEMS:  Unable to obtain due to dysarthria   PHYSICAL EXAMINATION  GENERAL APPEARANCE: Well nourished. In no acute distress. Normal body habitus SKIN:  Skin is warm and dry.  MOUTH and THROAT: has right facial droop RESPIRATORY: breathing is even & unlabored, BS CTAB CARDIAC: RRR, no murmur,no extra heart sounds, no edema GI: abdomen soft, normal BS, no masses, no tenderness, no hepatomegaly, no splenomegaly EXTREMITIES:  Able to move RUE and BLE unable to moveLUE PSYCHIATRIC: Affect and behavior are appropriate   LABS/RADIOLOGY: Labs reviewed: Basic Metabolic Panel:  Recent Labs  09/14/16 09/28/16 01/10/17  NA 138 140 136*  K 4.6 4.5 4.8  BUN 55* 36* 38*  CREATININE 1.7* 1.2 1.2   Liver Function Tests:  Recent Labs  04/11/16 01/10/17  AST 56* 31  ALT 74* 26  ALKPHOS 119 126*   CBC:  Recent Labs  09/20/16 09/28/16 01/10/17  WBC 7.1 9.5  7.7  NEUTROABS  --   --  3  HGB 13.1* 13.0* 13.1*  HCT 39* 40* 41  PLT 157 165 156   Lipid Panel:  Recent Labs  09/14/16  HDL 30*    ASSESSMENT/PLAN:  1. Cough -start Robitussin 100 mg/5 ml give 10 ml = 200 mg Q 6AM, 2PM and 10PM   2. Chest congestion - will do CXR to rule out PNA, start albuterol 2.5 mg / 3 mL 1 neb every 6 a.m., 2 PM and 10 PM X 1 week      Monina C. [place] - NP    [employer] [phone]

## 2017-02-28 DIAGNOSIS — I69322 Dysarthria following cerebral infarction: Secondary | ICD-10-CM | POA: Diagnosis not present

## 2017-02-28 DIAGNOSIS — R293 Abnormal posture: Secondary | ICD-10-CM | POA: Diagnosis not present

## 2017-03-09 ENCOUNTER — Ambulatory Visit (HOSPITAL_BASED_OUTPATIENT_CLINIC_OR_DEPARTMENT_OTHER): Payer: Medicare Other | Admitting: Physical Medicine & Rehabilitation

## 2017-03-09 ENCOUNTER — Encounter: Payer: Self-pay | Admitting: Physical Medicine & Rehabilitation

## 2017-03-09 ENCOUNTER — Encounter: Payer: Medicare Other | Attending: Physical Medicine & Rehabilitation

## 2017-03-09 VITALS — BP 123/74 | HR 58 | Resp 14

## 2017-03-09 DIAGNOSIS — G8114 Spastic hemiplegia affecting left nondominant side: Secondary | ICD-10-CM | POA: Insufficient documentation

## 2017-03-09 NOTE — Patient Instructions (Signed)

## 2017-03-09 NOTE — Progress Notes (Signed)
Botox Injection for spasticity using needle EMG guidance Left upper extremity. Nondominant, spastic hemiparesis secondary to stroke  Dilution: 50 Units/ml Indication: Severe spasticity which interferes with ADL,mobility and/or  hygiene and is unresponsive to medication management and other conservative care Informed consent was obtained after describing risks and benefits of the procedure with the patient. This includes bleeding, bruising, infection, excessive weakness, or medication side effects. A REMS form is on file and signed. Needle: 27g 1" needle electrode Number of units per muscle FCR 50  FDS, 25 FDP 25 Pronator teres 50 Pectoralis 50 All injections were done after obtaining appropriate EMG activity and after negative drawback for blood. The patient tolerated the procedure well. Post procedure instructions were given. A followup appointment was made.

## 2017-03-23 DIAGNOSIS — E1140 Type 2 diabetes mellitus with diabetic neuropathy, unspecified: Secondary | ICD-10-CM | POA: Diagnosis not present

## 2017-03-23 DIAGNOSIS — B351 Tinea unguium: Secondary | ICD-10-CM | POA: Diagnosis not present

## 2017-03-23 DIAGNOSIS — Z7984 Long term (current) use of oral hypoglycemic drugs: Secondary | ICD-10-CM | POA: Diagnosis not present

## 2017-03-27 ENCOUNTER — Non-Acute Institutional Stay (SKILLED_NURSING_FACILITY): Payer: Medicare Other | Admitting: Internal Medicine

## 2017-03-27 ENCOUNTER — Encounter: Payer: Self-pay | Admitting: Internal Medicine

## 2017-03-27 DIAGNOSIS — E1121 Type 2 diabetes mellitus with diabetic nephropathy: Secondary | ICD-10-CM

## 2017-03-27 DIAGNOSIS — R296 Repeated falls: Secondary | ICD-10-CM

## 2017-03-27 DIAGNOSIS — I10 Essential (primary) hypertension: Secondary | ICD-10-CM

## 2017-03-27 DIAGNOSIS — I63311 Cerebral infarction due to thrombosis of right middle cerebral artery: Secondary | ICD-10-CM

## 2017-03-27 NOTE — Progress Notes (Signed)
   NURSING HOME LOCATION:  [facility] ROOM NUMBER:  122-A  CODE STATUS:  Full Code  PCP:  [name], MD  [place] [address]  This is a nursing facility follow up of chronic medical diagnoses &  Recent fall.  Interim medical record and care since last [place] visit was updated with review of diagnostic studies and change in clinical status since last visit were documented.  HPI: The patient is a permanent resident facility. He has had multiple falls due to hemiparesis post CVA & him repeatedly trying to ambulate w/o help. On 03/25/17 he was found on floor by bedside. Complaints of shoulder pain as per staff. MMDS portable imaging suggested possible impacted fracture R hip.   Medical diagnoses include history of stroke with progressive left hemiplegia and severe dysarthria and dysphagia. He has failed swallowing test multiple times and has a PEG. He is on full dose aspirin prophylactically. Plavix had been discontinued because of GI bleeding requiring transfusion. Other diagnoses include coronary artery disease, type 2 diabetes with neuropathy, depressive psychosis, and recurrent falls. Secondary prevention is pursued with statin and antihypertensive medications. Labs are current. On 9/5 glucose was 121, sodium 136, phosphatase 126. Hemoglobin was 13.1 and stable. His last A1c was 5.9% on 5/10. Significantly his creatinine was 1.7 at that time but has dropped to 1.2 and is stable.  Review of systems: Non verbal state prevented responses. He did turn  his head indicating "no" to any active symptoms including extremity pain. He also did deny any cardiopulmonary or GI symptoms.  Physical exam:  Pertinent or positive findings: His skin is visibly oily in appearance. As noted he is nonverbal with only guttural sounds. He exhibits a blank stare when he awakened. He has severe plaque formation over the teeth. He has minor expiratory rhonchi bilaterally  anteriorly. Heart sounds are markedly distant. PEG tube is present. Dorsalis pedis pulses are decreased but posterior tibial pulses are palpable. There is marked decreased range of motion of the left lower extremity and to lesser extent the left upper extremity. He exhibits clawlike deformities of the left fingers. The right great nail is deformed essentially absent.  General appearance:Adequately nourished; no acute distress , increased work of breathing is present.   Lymphatic: No lymphadenopathy about the head, neck, axilla . Eyes: No conjunctival inflammation or lid edema is present. There is no scleral icterus. Ears:  External ear exam shows no significant lesions or deformities.   Nose:  External nasal examination shows no deformity or inflammation. Nasal mucosa are pink and moist without lesions ,exudates Oral exam: lips and gums are healthy appearing.There is no oropharyngeal erythema or exudate . Neck:  No thyromegaly, masses, tenderness noted.    Heart:  No gallop, murmur, click, rub .  Lungs: without wheezes,rales , rubs. Abdomen:Bowel sounds are normal. Abdomen is soft and nontender with no organomegaly, hernias,masses. GU: deferred  Extremities:  No cyanosis, clubbing,edema  Neurologic exam : Skin: Warm & dry w/o tenting. No significant lesions or rash.  See summary under each active problem in the Problem List with associated updated therapeutic plan

## 2017-03-28 ENCOUNTER — Other Ambulatory Visit: Payer: Self-pay | Admitting: Internal Medicine

## 2017-03-28 ENCOUNTER — Emergency Department (HOSPITAL_COMMUNITY): Payer: Medicare Other

## 2017-03-28 ENCOUNTER — Encounter: Payer: Self-pay | Admitting: Internal Medicine

## 2017-03-28 ENCOUNTER — Emergency Department (HOSPITAL_COMMUNITY)
Admission: EM | Admit: 2017-03-28 | Discharge: 2017-03-28 | Disposition: A | Discharge status: Skilled Nursing Facility | Payer: Medicare Other | Attending: Emergency Medicine | Admitting: Emergency Medicine

## 2017-03-28 DIAGNOSIS — Z79899 Other long term (current) drug therapy: Secondary | ICD-10-CM | POA: Insufficient documentation

## 2017-03-28 DIAGNOSIS — M25561 Pain in right knee: Secondary | ICD-10-CM | POA: Diagnosis not present

## 2017-03-28 DIAGNOSIS — Y999 Unspecified external cause status: Secondary | ICD-10-CM | POA: Diagnosis not present

## 2017-03-28 DIAGNOSIS — M25552 Pain in left hip: Secondary | ICD-10-CM | POA: Diagnosis not present

## 2017-03-28 DIAGNOSIS — N189 Chronic kidney disease, unspecified: Secondary | ICD-10-CM | POA: Diagnosis not present

## 2017-03-28 DIAGNOSIS — M25562 Pain in left knee: Secondary | ICD-10-CM | POA: Diagnosis not present

## 2017-03-28 DIAGNOSIS — Z87891 Personal history of nicotine dependence: Secondary | ICD-10-CM | POA: Diagnosis not present

## 2017-03-28 DIAGNOSIS — G8911 Acute pain due to trauma: Secondary | ICD-10-CM | POA: Diagnosis not present

## 2017-03-28 DIAGNOSIS — Z794 Long term (current) use of insulin: Secondary | ICD-10-CM | POA: Diagnosis not present

## 2017-03-28 DIAGNOSIS — I129 Hypertensive chronic kidney disease with stage 1 through stage 4 chronic kidney disease, or unspecified chronic kidney disease: Secondary | ICD-10-CM | POA: Diagnosis not present

## 2017-03-28 DIAGNOSIS — F0390 Unspecified dementia without behavioral disturbance: Secondary | ICD-10-CM | POA: Insufficient documentation

## 2017-03-28 DIAGNOSIS — T148XXA Other injury of unspecified body region, initial encounter: Secondary | ICD-10-CM | POA: Diagnosis not present

## 2017-03-28 DIAGNOSIS — E1121 Type 2 diabetes mellitus with diabetic nephropathy: Secondary | ICD-10-CM | POA: Insufficient documentation

## 2017-03-28 DIAGNOSIS — Y9389 Activity, other specified: Secondary | ICD-10-CM | POA: Insufficient documentation

## 2017-03-28 DIAGNOSIS — W19XXXA Unspecified fall, initial encounter: Secondary | ICD-10-CM | POA: Insufficient documentation

## 2017-03-28 DIAGNOSIS — M25551 Pain in right hip: Secondary | ICD-10-CM | POA: Diagnosis not present

## 2017-03-28 DIAGNOSIS — Z7982 Long term (current) use of aspirin: Secondary | ICD-10-CM | POA: Insufficient documentation

## 2017-03-28 DIAGNOSIS — Y92129 Unspecified place in nursing home as the place of occurrence of the external cause: Secondary | ICD-10-CM | POA: Diagnosis not present

## 2017-03-28 DIAGNOSIS — M25511 Pain in right shoulder: Secondary | ICD-10-CM | POA: Diagnosis not present

## 2017-03-28 DIAGNOSIS — S79911A Unspecified injury of right hip, initial encounter: Secondary | ICD-10-CM | POA: Diagnosis not present

## 2017-03-28 DIAGNOSIS — R4182 Altered mental status, unspecified: Secondary | ICD-10-CM | POA: Diagnosis not present

## 2017-03-28 DIAGNOSIS — M79621 Pain in right upper arm: Secondary | ICD-10-CM | POA: Diagnosis not present

## 2017-03-28 LAB — BASIC METABOLIC PANEL
Anion gap: 5 (ref 5–15)
BUN: 34 mg/dL — AB (ref 6–20)
CHLORIDE: 106 mmol/L (ref 101–111)
CO2: 24 mmol/L (ref 22–32)
Calcium: 8.8 mg/dL — ABNORMAL LOW (ref 8.9–10.3)
Creatinine, Ser: 1.2 mg/dL (ref 0.61–1.24)
GFR calc non Af Amer: >60 mL/min (ref >60)
Glucose, Bld: 108 mg/dL — ABNORMAL HIGH (ref 65–99)
Potassium: 4.3 mmol/L (ref 3.5–5.1)
Sodium: 135 mmol/L (ref 135–145)

## 2017-03-28 LAB — CBC WITH DIFFERENTIAL/PLATELET
BASOS ABS: 0 10*3/uL (ref 0.0–0.1)
Basophils Relative: 0 %
EOS ABS: 1.3 10*3/uL — AB (ref 0.0–0.7)
Eosinophils Relative: 16 %
HCT: 40.9 % (ref 39.0–52.0)
HEMOGLOBIN: 13.8 g/dL (ref 13.0–17.0)
LYMPHS ABS: 3 10*3/uL (ref 0.7–4.0)
Lymphocytes Relative: 37 %
MCH: 29.5 pg (ref 26.0–34.0)
MCHC: 33.7 g/dL (ref 30.0–36.0)
MCV: 87.4 fL (ref 78.0–100.0)
MONOS PCT: 13 %
Monocytes Absolute: 1.1 10*3/uL — ABNORMAL HIGH (ref 0.1–1.0)
NEUTROS PCT: 34 %
Neutro Abs: 2.8 10*3/uL (ref 1.7–7.7)
Platelets: 157 10*3/uL (ref 150–400)
RBC: 4.68 MIL/uL (ref 4.22–5.81)
RDW: 15.1 % (ref 11.5–15.5)
WBC: 8.3 10*3/uL (ref 4.0–10.5)

## 2017-03-28 NOTE — ED Triage Notes (Signed)
Pt from [facility] with a femoral fracture from a fall last Saturday, Pt had no complaints until today. Staff did xrays today and found the fracture Pt has hx of CVA. Pt is at baseline per facility staff

## 2017-03-28 NOTE — Discharge Instructions (Signed)
X-rays in the ER of both of his hips and knees show no fractures. Please follow up with his primary care provider.

## 2017-03-28 NOTE — Assessment & Plan Note (Signed)
No clinical improvement  Continue secondary prevention regimen Neurology follow-up in January

## 2017-03-28 NOTE — Assessment & Plan Note (Signed)
BP controlled; no change in antihypertensive medications  

## 2017-03-28 NOTE — ED Notes (Signed)
Data for Oxygen saturation 89% invalidated due to pt.'s pulse Oximeter was off from pt.'s finger;  Rechecked with O2 sat of 97% on room air.

## 2017-03-28 NOTE — Assessment & Plan Note (Addendum)
03/25/17 found on floor by bed; MMDS portable films 11/21 suggested possible impacted R hip fracture. CT ordered

## 2017-03-28 NOTE — Patient Instructions (Addendum)
See assessment and plan under each diagnosis in the problem list and acutely for this visit 

## 2017-03-28 NOTE — ED Provider Notes (Signed)
[facility] DEPT Provider Note   CSN: [id number] Arrival date & time: 03/28/17  1431     History   Chief Complaint Chief Complaint  Patient presents with  . Fall    HPI Sean Collins is a 66 y.o. male.  Level V Caveat: non-verbal.   Sean Collins is a 66 y.o. Male who presents to the emergency department via EMS from his skilled nursing facility after a fall about 1 week ago.  SNF is [facility]. Little information provided about the fall one week ago. They did x-rays today of his right shoulder and right hip.  The facility Center report that the radiologist recommends CT scan of his right hip to rule out an impaction fracture that is subtle. Right shoulder x-ray is normal.  They report they did not x-ray him until today because he had no complaint of pain until today.  They report he pointed to pain to his right hip.  On my exam patient points to some pain to his left hip. He has history of CVA and is non-verbal at baseline. They report his mental status is at baseline currently.    The history is provided by the patient and medical records. No language interpreter was used.  Fall     Past Medical History:  Diagnosis Date  . Diabetes mellitus without complication ([place])   . Dysarthria   . Dysphagia   . GI bleed   . Hyperlipidemia   . Hypertension   . Renal insufficiency 09/22/2016  . Stroke [facility])     Patient Active Problem List   Diagnosis Date Noted  . Renal insufficiency 09/22/2016  . Type 2 diabetes mellitus with diabetic nephropathy, without long-term current use of insulin ([place]) 09/13/2016  . Depressive psychosis ([place]) 09/13/2016  . Cervical myofascial pain syndrome 08/29/2016  . Cervical facet joint syndrome 08/29/2016  . Recurrent falls 07/11/2016  . Major depressive disorder, recurrent episode ([place]) 04/17/2016  . Shoulder pain, left 02/07/2016  . Essential hypertension 12/08/2015  . Late effects of CVA  (cerebrovascular accident) 12/08/2015  . Cerebrovascular accident (CVA) due to thrombosis of right middle cerebral artery ([place])   . Slurred speech   . UGIB (upper gastrointestinal bleed)   . Malnutrition of moderate degree 11/04/2015  . Hematemesis without nausea   . Left hemiparesis ([place])   . Dysphagia due to recent stroke   . Hyperlipidemia 11/16/2011  . Gout 11/16/2011  . History of stroke 11/16/2011  . CAD (coronary artery disease) 11/16/2011    Past Surgical History:  Procedure Laterality Date  . ESOPHAGOGASTRODUODENOSCOPY (EGD) WITH PROPOFOL N/A 11/03/2015   Procedure: ESOPHAGOGASTRODUODENOSCOPY (EGD) WITH PROPOFOL;  Surgeon: Manus Gunning, MD;  Location: [facility];  Service: Gastroenterology;  Laterality: N/A;  . IR REPLC GASTRO/COLONIC TUBE PERCUT W/FLUORO  12/29/2016       Home Medications    Prior to Admission medications   Medication Sig Start Date End Date Taking? Authorizing Provider  acetaminophen (TYLENOL) 650 MG CR tablet 650 mg every 6 (six) hours as needed for pain or fever.     [provider]  allopurinol (ZYLOPRIM) 150 mg TABS tablet Place 150 mg into feeding tube daily.    [provider]  amLODipine (NORVASC) 10 MG tablet Place 10 mg into feeding tube daily.    [provider]  aspirin 325 MG tablet Place 325 mg into feeding tube daily.    [provider]  atenolol (TENORMIN) 50 MG tablet Place 50 mg  into feeding tube 2 (two) times daily.    [provider]  atorvastatin (LIPITOR) 80 MG tablet Place 80 mg into feeding tube daily.     [provider]  carboxymethylcellulose (REFRESH PLUS) 0.5 % SOLN Place 1 drop into both eyes 3 (three) times daily as needed (dry eyes).    [provider]  cholecalciferol (VITAMIN D) 1000 UNITS tablet 1,000 Units daily. Give via PEG    [provider]  divalproex (DEPAKOTE) 250 MG DR tablet 250 mg 2 (two) times daily.     [provider]  enalapril (VASOTEC) 20 MG tablet Take 1 tablet (20 mg total) by mouth daily. 11/18/11   [name], MD  folic acid (FOLVITE) 1 MG tablet Place 1 mg into feeding tube daily.     [provider]  Nutritional Supplements (ISOSOURCE 1.5 CAL) LIQD Give 2 cans three times daily bolus via PEG tub with 30 ml flushes before and after feeding and medications. This will provide 2250 Kcal per day.    [provider]  QUEtiapine (SEROQUEL) 25 MG tablet Place 25 mg into feeding tube 2 (two) times daily. 1200 and 1800     [provider]  ranitidine (ZANTAC) 15 MG/ML syrup Place into feeding tube 2 (two) times daily. Take 5 mL q12h    [provider]    Family History Family History  Problem Relation Age of Onset  . Stroke Brother   . Diabetes Brother   . Stroke Brother   . Stroke Maternal Aunt     Social History Social History   Tobacco Use  . Smoking status: Former Smoker    Packs/day: 0.50    Years: 32.00    Pack years: 16.00    Types: Cigarettes  . Smokeless tobacco: Never Used  . Tobacco comment: Started at age 15 though quit for 10 years at one point  Substance Use Topics  . Alcohol use: No    Alcohol/week: 0.0 oz  . Drug use: No     Allergies   Patient has no known allergies.   Review of Systems Review of Systems  Unable to perform ROS: Patient nonverbal     Physical Exam Updated Vital Signs BP 137/90 (BP Location: Right Arm)   Pulse 67   Temp 98 F (36.7 C) (Oral)   Resp 17   SpO2 97%   Physical Exam  Constitutional: He appears well-developed and well-nourished. No distress.  Non-toxic appearing.   HENT:  Head: Normocephalic and atraumatic.  Right Ear: External ear normal.  Left Ear: External ear normal.  Mouth/Throat: Oropharynx is clear and moist.  No visible or palpated signs of head injury or trauma.   Eyes: Conjunctivae are normal. Pupils are equal, round, and reactive to light. Right eye exhibits no discharge.  Left eye exhibits no discharge.  Neck: Neck supple.  No midline neck TTP.   Cardiovascular: Normal rate, regular rhythm, normal heart sounds and intact distal pulses.  Bilateral radial and posterior tibialis pulses are intact.   Pulmonary/Chest: Effort normal and breath sounds normal. No respiratory distress. He has no wheezes. He has no rales. He exhibits no tenderness.  Abdominal: Soft. He exhibits no distension. There is no tenderness. There is no guarding.  Musculoskeletal: He exhibits no edema or deformity.  Good ROM of his bilateral upper extremities. Patient able to raise is right leg in the air in the bed at his right hip without evidence of any difficulty or pain.  Patient is unable to raise his left leg during exam. Appears to have pain with movement at his left hip. No midline back TTP. No leg shortening or rotation.   Lymphadenopathy:    He has no cervical adenopathy.  Neurological: He is alert. He exhibits normal muscle tone. Coordination normal.  Non-verbal. Follows commands to move extremities when asked.   Skin: Skin is warm and dry. Capillary refill takes less than 2 seconds. No rash noted. He is not diaphoretic. No erythema. No pallor.  Psychiatric: He has a normal mood and affect. His behavior is normal.  Nursing note and vitals reviewed.    ED Treatments / Results  Labs (all labs ordered are listed, but only abnormal results are displayed) Labs Reviewed  BASIC METABOLIC PANEL - Abnormal; Notable for the following components:      Result Value   Glucose, Bld 108 (*)    BUN 34 (*)    Calcium 8.8 (*)    All other components within normal limits  CBC WITH DIFFERENTIAL/PLATELET - Abnormal; Notable for the following components:   Monocytes Absolute 1.1 (*)    Eosinophils Absolute 1.3 (*)    All other components within normal limits    EKG  EKG Interpretation None       Radiology Dg Knee Complete 4 Views Left  Result Date: 03/28/2017 CLINICAL DATA:  Fall 4  days ago. Left knee pain. Initial encounter. EXAM: LEFT KNEE - COMPLETE 4+ VIEW COMPARISON:  None. FINDINGS: No evidence of fracture, dislocation, or joint effusion. No evidence of arthropathy or other focal bone abnormality. Generalized osteopenia. Extensive peripheral vascular calcification. IMPRESSION: No acute findings. Electronically Signed   By: Earle Gell M.D.   On: 03/28/2017 20:03   Dg Knee Complete 4 Views Right  Result Date: 03/28/2017 CLINICAL DATA:  Fall with knee pain. EXAM: RIGHT KNEE - COMPLETE 4+ VIEW COMPARISON:  09/21/2008 FINDINGS: Vascular calcifications. No acute fracture or dislocation. Enthesophyte at the quadriceps insertion. No joint effusion. Joint spaces are maintained for age. IMPRESSION: No acute osseous abnormality. Electronically Signed   By: Abigail Miyamoto M.D.   On: 03/28/2017 20:03   Dg Hips Bilat W Or Wo Pelvis 5 Views  Result Date: 03/28/2017 CLINICAL DATA:  Left hip pain after fall. EXAM: DG HIP (WITH OR WITHOUT PELVIS) 5+V BILAT COMPARISON:  None. FINDINGS: Status post left hip arthroplasty. Right hip appears normal. No fracture or dislocation is noted. IMPRESSION: Normal right hip.  Status post left hip arthroplasty. Electronically Signed   By: Marijo Conception, M.D.   On: 03/28/2017 16:39    Procedures Procedures (including critical care time)  Medications Ordered in ED Medications - No data to display   Initial Impression / Assessment and Plan / ED Course  I have reviewed the triage vital signs and the nursing notes.  Pertinent labs & imaging results that were available during my care of the patient were reviewed by me and considered in my medical decision making (see chart for details).    This  is a 66 y.o. Male who presents to the emergency department via EMS from his skilled nursing facility after a fall about 1 week ago.  SNF is [facility]. Little information provided about the fall one week ago. They did x-rays today of his right shoulder and  right hip.  The facility Center report that the radiologist recommends CT scan of his right hip to rule out an impaction fracture that is subtle. Right shoulder x-ray is normal.  They report they did not x-ray him until today because he had no complaint of pain until today.  They report he pointed to pain to his right hip.  On my exam patient points to some pain to his left hip. He has history of CVA and is non-verbal at baseline. They report his mental status is at baseline currently.  SNF also reports that he is non-ambulatory and can only stand and pivot.  On exam the patient is afebrile nontoxic-appearing.  He has no midline neck or back tenderness.  He is moving his upper extremities without difficulty.  He moves his right hip up in the air without any difficulty or evidence of any pain.  He has trouble lifting his left hip up in the air during exam.   X-rays at facility were done on his right hip.  We will obtain x-rays of his bilateral hips. X-ray of his bilateral hips showed no acute findings.  No evidence of any fracture.  Will attempt to have him bear weight on his hips at this time.  He is stand and pivot only. Nursing staff helped him stand and apparently patient reported pain to his knees with standing.  He did place weight on both of his hips.  I feel like fracture of his hips are very unlikely. X-rays were obtained of his bilateral knees, due to patient complaining of some pain there with standing.  These were unremarkable.  He has good range of motion of his knees bilaterally.  Overall, x-rays are reassuring.  He is able to stand and bear weight. Will discharge back to skilled nursing facility for follow-up by his primary care doctor.   This patient was discussed with and evaluated by Dr. Regenia Skeeter who agrees with assessment and plan.   Final Clinical Impressions(s) / ED Diagnoses   Final diagnoses:  Fall, initial encounter  Pain of both hip joints  Pain in both knees, unspecified  chronicity    ED Discharge Orders    None       [place], [place], Hershal Coria 03/28/17 2053    Sherwood Gambler, MD 03/29/17 0010

## 2017-03-28 NOTE — ED Notes (Signed)
Pt was unable to stand and pivot with assistance. Pt had c/o of pain in bilateral knees at this time.

## 2017-03-28 NOTE — Assessment & Plan Note (Signed)
Update A1c; continue ACE-I

## 2017-03-31 DIAGNOSIS — E785 Hyperlipidemia, unspecified: Secondary | ICD-10-CM | POA: Diagnosis not present

## 2017-03-31 DIAGNOSIS — D649 Anemia, unspecified: Secondary | ICD-10-CM | POA: Diagnosis not present

## 2017-03-31 DIAGNOSIS — F39 Unspecified mood [affective] disorder: Secondary | ICD-10-CM | POA: Diagnosis not present

## 2017-03-31 DIAGNOSIS — E119 Type 2 diabetes mellitus without complications: Secondary | ICD-10-CM | POA: Diagnosis not present

## 2017-03-31 LAB — HEMOGLOBIN A1C: Hemoglobin A1C: 6.1

## 2017-04-02 LAB — HEMOGLOBIN A1C: Hemoglobin A1C: 6

## 2017-04-06 ENCOUNTER — Telehealth: Payer: Self-pay | Admitting: Physical Medicine & Rehabilitation

## 2017-04-06 NOTE — Telephone Encounter (Signed)
PHONED [facility] AS Sean Collins IS A PATIENT - SPOKE TO CHARGE NURSE AND ADVISE [place] WANTS TO DO 20611 SHOULDER INJECTION ON 12.14.18 - ASKED IF THEY WANTED TO APPROVE /CONTACT INSURANCE TO COORDINATE CARE - SHE DOESN'T KNOW WILL TRY TO GET SOMEONE TO CALL BACK.

## 2017-04-17 ENCOUNTER — Encounter: Payer: Self-pay | Admitting: Adult Health

## 2017-04-17 ENCOUNTER — Non-Acute Institutional Stay (SKILLED_NURSING_FACILITY): Payer: Medicare Other | Admitting: Adult Health

## 2017-04-17 DIAGNOSIS — F323 Major depressive disorder, single episode, severe with psychotic features: Secondary | ICD-10-CM

## 2017-04-17 DIAGNOSIS — B372 Candidiasis of skin and nail: Secondary | ICD-10-CM

## 2017-04-17 DIAGNOSIS — G8114 Spastic hemiplegia affecting left nondominant side: Secondary | ICD-10-CM

## 2017-04-17 DIAGNOSIS — M109 Gout, unspecified: Secondary | ICD-10-CM

## 2017-04-17 DIAGNOSIS — F39 Unspecified mood [affective] disorder: Secondary | ICD-10-CM

## 2017-04-17 DIAGNOSIS — I69391 Dysphagia following cerebral infarction: Secondary | ICD-10-CM

## 2017-04-17 DIAGNOSIS — Z8673 Personal history of transient ischemic attack (TIA), and cerebral infarction without residual deficits: Secondary | ICD-10-CM | POA: Diagnosis not present

## 2017-04-17 DIAGNOSIS — F0151 Vascular dementia with behavioral disturbance: Secondary | ICD-10-CM

## 2017-04-17 DIAGNOSIS — I10 Essential (primary) hypertension: Secondary | ICD-10-CM

## 2017-04-17 DIAGNOSIS — F01518 Vascular dementia, unspecified severity, with other behavioral disturbance: Secondary | ICD-10-CM

## 2017-04-17 NOTE — Progress Notes (Signed)
Location:  [place] Room Number: 122-A Place of Service:  SNF (31) Provider:  Durenda Age, NP  Patient Care Team: [name], MD as PCP - General (Internal Medicine) [place] as Referring Physician (General Practice) Letta Pate Luanna Salk, MD as Consulting Physician (Physical Medicine and Rehabilitation) Rosalin Hawking, MD as Consulting Physician (Neurology)  Extended Emergency Contact Information Primary Emergency Contact: [place] of [place] Phone: [phone] Relation: Brother  Code Status:  Full Code  Goals of care: Advanced Directive information Advanced Directives 03/09/2017  Does Patient Have a Medical Advance Directive? No  Type of Advance Directive -  Does patient want to make changes to medical advance directive? -  Copy of [place] in Chart? -  Would patient like information on creating a medical advance directive? -     Chief Complaint  Patient presents with  . Medical Management of Chronic Issues    Routine [facility] visit    HPI:  Pt is a 66 y.o. male seen today for medical management of chronic diseases.  He is a long-term care resident of [facility].  He has a PMH of CVA, atherosclerosis, CAD, DM, HTN, and HLD. He was seen in his room today. He was noted to have erythematous rashes on his scrotum.   Past Medical History:  Diagnosis Date  . Diabetes mellitus without complication ([place])   . Dysarthria   . Dysphagia   . GI bleed   . Hyperlipidemia   . Hypertension   . Renal insufficiency 09/22/2016  . Stroke [facility])    Past Surgical History:  Procedure Laterality Date  . ESOPHAGOGASTRODUODENOSCOPY (EGD) WITH PROPOFOL N/A 11/03/2015   Procedure: ESOPHAGOGASTRODUODENOSCOPY (EGD) WITH PROPOFOL;  Surgeon: Manus Gunning, MD;  Location: [place];  Service: Gastroenterology;  Laterality: N/A;  . IR REPLC GASTRO/COLONIC TUBE PERCUT  W/FLUORO  12/29/2016    No Known Allergies  Outpatient Encounter Medications as of 04/17/2017  Medication Sig  . acetaminophen (TYLENOL) 650 MG CR tablet 650 mg every 6 (six) hours as needed for pain or fever.   Marland Kitchen allopurinol (ZYLOPRIM) 150 mg TABS tablet Place 150 mg into feeding tube daily.  Marland Kitchen amLODipine (NORVASC) 10 MG tablet Place 10 mg into feeding tube daily.  Marland Kitchen aspirin 325 MG tablet Place 325 mg into feeding tube daily.  Marland Kitchen atenolol (TENORMIN) 50 MG tablet Place 50 mg into feeding tube 2 (two) times daily.  Marland Kitchen atorvastatin (LIPITOR) 80 MG tablet Place 80 mg into feeding tube daily.   . carboxymethylcellulose (REFRESH PLUS) 0.5 % SOLN Place 1 drop into both eyes 3 (three) times daily as needed (dry eyes).  . cholecalciferol (VITAMIN D) 1000 UNITS tablet 1,000 Units daily. Give via PEG  . divalproex (DEPAKOTE) 250 MG DR tablet 250 mg 2 (two) times daily.   . enalapril (VASOTEC) 20 MG tablet Take 1 tablet (20 mg total) by mouth daily.  . folic acid (FOLVITE) 1 MG tablet Place 1 mg into feeding tube daily.   . Nutritional Supplements (ISOSOURCE 1.5 CAL) LIQD Give 2 cans three times daily bolus via PEG tub with 30 ml flushes before and after feeding and medications. This will provide 2250 Kcal per day.  Marland Kitchen QUEtiapine (SEROQUEL) 25 MG tablet Place 25 mg into feeding tube 2 (two) times daily. 1200 and 1800   . ranitidine (ZANTAC) 15 MG/ML syrup Place into feeding tube 2 (two) times daily. Take 5 mL q12h   No facility-administered  encounter medications on file as of 04/17/2017.     Review of Systems  Unable to obtain due to dysarthria    Immunization History  Administered Date(s) Administered  . Influenza-Unspecified 06/21/2015  . PPD Test 11/08/2015  . Pneumococcal-Unspecified 06/21/2015   Pertinent  Health Maintenance Due  Topic Date Due  . PNA vac Low Risk Adult (1 of 2 - PCV13) 06/20/2016  . OPHTHALMOLOGY EXAM  02/16/2017  . INFLUENZA VACCINE  05/08/2017 (Originally 12/06/2016)    . COLONOSCOPY  05/08/2018 (Originally 10/15/2000)  . FOOT EXAM  07/05/2018 (Originally [birth year]15/1960)  . HEMOGLOBIN A1C  09/30/2017   Fall Risk  03/09/2017 01/02/2017 11/15/2016 10/13/2016 08/29/2016  Falls in the past year? Exclusion - non ambulatory Yes No Yes Yes  Comment - - - - -  Number falls in past yr: - 2 or more - 2 or more 2 or more  Comment - - - - -  Injury with Fall? - No - No No  Risk Factor Category  - - - High Fall Risk High Fall Risk  Risk for fall due to : - - - - Impaired balance/gait;Impaired mobility;Mental status change  Follow up - - - - -      Vitals:   04/17/17 1637  BP: 124/75  Pulse: 80  Resp: 20  Temp: (!) 97.4 F (36.3 C)  TempSrc: Oral  SpO2: 98%  Weight: 169 lb 6.4 oz (76.8 kg)  Height: 5\' 9"  (1.753 m)   Body mass index is 25.02 kg/m.  Physical Exam  GENERAL APPEARANCE: Well nourished. In no acute distress. Normal body habitus SKIN:  Has erythematous rashes on his scrotum.  MOUTH and THROAT: Lips are without lesions.  RESPIRATORY: Breathing is even & unlabored, BS CTAB CARDIAC: RRR, no murmur,no extra heart sounds, no edema GI: Abdomen soft, normal BS, no masses, no tenderness, +PEG tube on LUQ EXTREMITIES:  Can't move LUE and LLE PSYCHIATRIC:  Affect and behavior are appropriate   Labs reviewed: Recent Labs    09/28/16 01/10/17 03/28/17 1544  NA 140 136* 135  K 4.5 4.8 4.3  CL  --   --  106  CO2  --   --  24  GLUCOSE  --   --  108*  BUN 36* 38* 34*  CREATININE 1.2 1.2 1.20  CALCIUM  --   --  8.8*   Recent Labs    01/10/17  AST 31  ALT 26  ALKPHOS 126*   Recent Labs    09/28/16 01/10/17 03/28/17 1544  WBC 9.5 7.7 8.3  NEUTROABS  --  3 2.8  HGB 13.0* 13.1* 13.8  HCT 40* 41 40.9  MCV  --   --  87.4  PLT 165 156 157    Lab Results  Component Value Date   HGBA1C 6 04/02/2017   Lab Results  Component Value Date   CHOL 94 09/14/2016   HDL 30 (A) 09/14/2016   LDLCALC 48 09/14/2016   TRIG 81 09/14/2016   CHOLHDL 3.2  10/24/2015    Significant Diagnostic Results in last 30 days:  Dg Knee Complete 4 Views Left  Result Date: 03/28/2017 CLINICAL DATA:  Fall 4 days ago. Left knee pain. Initial encounter. EXAM: LEFT KNEE - COMPLETE 4+ VIEW COMPARISON:  None. FINDINGS: No evidence of fracture, dislocation, or joint effusion. No evidence of arthropathy or other focal bone abnormality. Generalized osteopenia. Extensive peripheral vascular calcification. IMPRESSION: No acute findings. Electronically Signed   By: Sharrie Rothman.D.  On: 03/28/2017 20:03   Dg Knee Complete 4 Views Right  Result Date: 03/28/2017 CLINICAL DATA:  Fall with knee pain. EXAM: RIGHT KNEE - COMPLETE 4+ VIEW COMPARISON:  09/21/2008 FINDINGS: Vascular calcifications. No acute fracture or dislocation. Enthesophyte at the quadriceps insertion. No joint effusion. Joint spaces are maintained for age. IMPRESSION: No acute osseous abnormality. Electronically Signed   By: Abigail Miyamoto M.D.   On: 03/28/2017 20:03   Dg Hips Bilat W Or Wo Pelvis 5 Views  Result Date: 03/28/2017 CLINICAL DATA:  Left hip pain after fall. EXAM: DG HIP (WITH OR WITHOUT PELVIS) 5+V BILAT COMPARISON:  None. FINDINGS: Status post left hip arthroplasty. Right hip appears normal. No fracture or dislocation is noted. IMPRESSION: Normal right hip.  Status post left hip arthroplasty. Electronically Signed   By: Marijo Conception, M.D.   On: 03/28/2017 16:39    Assessment/Plan  1. Candidal skin infection - new, start nystatin ointment to rashes on scrotum 4 times a day 2 weeks, keep skin clean and dry   2. History of CVA (cerebrovascular accident) - stable, continue atenolol 50 mg twice a day, enalapril 20 mg 1 tab daily, amlodipine 10 mg 1 tab daily ,aspirin 325 mg 1 tab daily, Lipitor 80 mg 1 tab daily   3. Essential hypertension - well-controlled, continue atenolol 50 mg twice a day, enalapril 20 mg 1 tab daily, amlodipine 10 mg 1 tab daily   4. Gout, unspecified cause,  unspecified chronicity, unspecified site - continue allopurinol 150 mg 1 tab daily   5. Mood disorder (HCC) - mood is stable, continue divalproex 125 mg give 2 capsules = 250 mg twice a day   6. Depressive psychosis ([place]) - continue Quetiapine 25 mg 1 tab twice a day   7. Vascular dementia with behavior disturbance - continue supportive care, fall precautions   8. Left spastic hemiplegia ([place]) - encouraged to use call light, fall precautions   9. Dysphagia due to recent stroke - NPO, continue PEG tube feedings, aspiration precautions    Family/ staff Communication: Discussed plan of care with resident and charge nurse  Labs/tests ordered:  None  Goals of care:   Long-term care    Durenda Age, NP [facility] and Adult Medicine [phone] (Monday-Friday 8:00 a.m. - 5:00 p.m.) [phone] (after hours)

## 2017-04-20 ENCOUNTER — Ambulatory Visit: Payer: Medicare Other | Admitting: Physical Medicine & Rehabilitation

## 2017-04-20 ENCOUNTER — Encounter: Payer: PRIVATE HEALTH INSURANCE | Attending: Physical Medicine & Rehabilitation

## 2017-04-20 DIAGNOSIS — G8114 Spastic hemiplegia affecting left nondominant side: Secondary | ICD-10-CM | POA: Insufficient documentation

## 2017-04-25 DIAGNOSIS — R0989 Other specified symptoms and signs involving the circulatory and respiratory systems: Secondary | ICD-10-CM | POA: Diagnosis not present

## 2017-04-25 DIAGNOSIS — R05 Cough: Secondary | ICD-10-CM | POA: Diagnosis not present

## 2017-05-14 ENCOUNTER — Encounter: Payer: Self-pay | Admitting: Internal Medicine

## 2017-05-14 ENCOUNTER — Encounter: Payer: Self-pay | Admitting: Adult Health

## 2017-05-14 ENCOUNTER — Non-Acute Institutional Stay (SKILLED_NURSING_FACILITY): Payer: Medicare Other | Admitting: Adult Health

## 2017-05-14 DIAGNOSIS — I10 Essential (primary) hypertension: Secondary | ICD-10-CM

## 2017-05-14 DIAGNOSIS — Z8673 Personal history of transient ischemic attack (TIA), and cerebral infarction without residual deficits: Secondary | ICD-10-CM

## 2017-05-14 DIAGNOSIS — F323 Major depressive disorder, single episode, severe with psychotic features: Secondary | ICD-10-CM

## 2017-05-14 DIAGNOSIS — I69391 Dysphagia following cerebral infarction: Secondary | ICD-10-CM | POA: Diagnosis not present

## 2017-05-14 DIAGNOSIS — F39 Unspecified mood [affective] disorder: Secondary | ICD-10-CM

## 2017-05-14 DIAGNOSIS — M109 Gout, unspecified: Secondary | ICD-10-CM | POA: Diagnosis not present

## 2017-05-14 DIAGNOSIS — E559 Vitamin D deficiency, unspecified: Secondary | ICD-10-CM

## 2017-05-14 NOTE — Progress Notes (Signed)
This encounter was created in error - please disregard.

## 2017-05-14 NOTE — Progress Notes (Signed)
Location:  [place] Room Number: 122-A Place of Service:  SNF (31) Provider:  Durenda Age, NP  Patient Care Team: [name], MD as PCP - General (Internal Medicine) [place] as Referring Physician (General Practice) Letta Pate Luanna Salk, MD as Consulting Physician (Physical Medicine and Rehabilitation) Rosalin Hawking, MD as Consulting Physician (Neurology)  Extended Emergency Contact Information Primary Emergency Contact: [place] of [place] Phone: [phone] Relation: Brother  Code Status:  Full Code  Goals of care: Advanced Directive information Advanced Directives 03/09/2017  Does Patient Have a Medical Advance Directive? No  Type of Advance Directive -  Does patient want to make changes to medical advance directive? -  Copy of [place] in Chart? -  Would patient like information on creating a medical advance directive? -     Chief Complaint  Patient presents with  . Medical Management of Chronic Issues    Routine [facility] visit    HPI:  Pt is a 67 y.o. male seen today for medical management of chronic diseases.  He is a long-term care resident of [facility].  He has a PMH of CVA, atherosclerosis, CAD, DM, HTN, and HLD. He was seen in his room today. He was sleeping but easily aroused to verbal greeting. He is non-verbal and nods his head when he agrees to something.   Past Medical History:  Diagnosis Date  . Diabetes mellitus without complication ([place])   . Dysarthria   . Dysphagia   . GI bleed   . Hyperlipidemia   . Hypertension   . Renal insufficiency 09/22/2016  . Stroke [facility])    Past Surgical History:  Procedure Laterality Date  . ESOPHAGOGASTRODUODENOSCOPY (EGD) WITH PROPOFOL N/A 11/03/2015   Procedure: ESOPHAGOGASTRODUODENOSCOPY (EGD) WITH PROPOFOL;  Surgeon: Manus Gunning, MD;  Location: [place];  Service: Gastroenterology;   Laterality: N/A;  . IR REPLC GASTRO/COLONIC TUBE PERCUT W/FLUORO  12/29/2016    No Known Allergies  Outpatient Encounter Medications as of 05/14/2017  Medication Sig  . acetaminophen (TYLENOL) 650 MG CR tablet 650 mg every 6 (six) hours as needed for pain or fever.   Marland Kitchen allopurinol (ZYLOPRIM) 150 mg TABS tablet Place 150 mg into feeding tube daily.  Marland Kitchen amLODipine (NORVASC) 10 MG tablet Place 10 mg into feeding tube daily.  Marland Kitchen aspirin 325 MG tablet Place 325 mg into feeding tube daily.  Marland Kitchen atenolol (TENORMIN) 50 MG tablet Place 50 mg into feeding tube 2 (two) times daily.  Marland Kitchen atorvastatin (LIPITOR) 80 MG tablet Place 80 mg into feeding tube daily.   . carboxymethylcellulose (REFRESH PLUS) 0.5 % SOLN Place 1 drop into both eyes 3 (three) times daily as needed (dry eyes).  . cholecalciferol (VITAMIN D) 1000 UNITS tablet 1,000 Units daily. Give via PEG  . divalproex (DEPAKOTE) 125 MG DR tablet Take 125 mg by mouth 2 (two) times daily.  . enalapril (VASOTEC) 20 MG tablet Take 1 tablet (20 mg total) by mouth daily.  . folic acid (FOLVITE) 1 MG tablet Place 1 mg into feeding tube daily.   . Nutritional Supplements (ISOSOURCE 1.5 CAL) LIQD Give 2 cans three times daily bolus via PEG tub with 30 ml flushes before and after feeding and medications. This will provide 2250 Kcal per day.  Marland Kitchen QUEtiapine (SEROQUEL) 25 MG tablet Place 25 mg into feeding tube 2 (two) times daily. 1200 and 1800   . ranitidine (ZANTAC) 15 MG/ML syrup Place into feeding  tube 2 (two) times daily. Take 5 mL q12h   No facility-administered encounter medications on file as of 05/14/2017.     Review of Systems  Unable to obtain due to aphasia    Immunization History  Administered Date(s) Administered  . Influenza-Unspecified 06/21/2015  . PPD Test 11/08/2015  . Pneumococcal-Unspecified 06/21/2015   Pertinent  Health Maintenance Due  Topic Date Due  . PNA vac Low Risk Adult (1 of 2 - PCV13) 06/20/2016  . INFLUENZA VACCINE   12/06/2016  . OPHTHALMOLOGY EXAM  02/16/2017  . COLONOSCOPY  05/08/2018 (Originally 10/15/2000)  . FOOT EXAM  07/05/2018 (Originally 10/15/1960)  . HEMOGLOBIN A1C  09/30/2017   Fall Risk  03/09/2017 01/02/2017 11/15/2016 10/13/2016 08/29/2016  Falls in the past year? Exclusion - non ambulatory Yes No Yes Yes  Comment - - - - -  Number falls in past yr: - 2 or more - 2 or more 2 or more  Comment - - - - -  Injury with Fall? - No - No No  Risk Factor Category  - - - High Fall Risk High Fall Risk  Risk for fall due to : - - - - Impaired balance/gait;Impaired mobility;Mental status change  Follow up - - - - -      Vitals:   05/14/17 1615  BP: 133/79  Pulse: 60  Resp: 18  Temp: 98.7 F (37.1 C)  TempSrc: Oral  SpO2: 94%  Weight: 172 lb 3.2 oz (78.1 kg)  Height: 5\' 9"  (1.753 m)   Body mass index is 25.43 kg/m.  Physical Exam  GENERAL APPEARANCE: Well nourished. In no acute distress. Normal body habitus SKIN:  Skin is warm and dry.  MOUTH and THROAT: Lips are without lesions.  RESPIRATORY: Breathing is even & unlabored, BS CTAB CARDIAC: RRR, no murmur,no extra heart sounds, no edema GI: Has PEG tube on LUQ EXTREMITIES:  Cannot move LUE and LLE NEUROLOGICAL: Left hemiplegic, aphasic PSYCHIATRIC: Affect and behavior are appropriate   Labs reviewed: Recent Labs    09/28/16 01/10/17 03/28/17 1544  NA 140 136* 135  K 4.5 4.8 4.3  CL  --   --  106  CO2  --   --  24  GLUCOSE  --   --  108*  BUN 36* 38* 34*  CREATININE 1.2 1.2 1.20  CALCIUM  --   --  8.8*   Recent Labs    01/10/17  AST 31  ALT 26  ALKPHOS 126*   Recent Labs    09/28/16 01/10/17 03/28/17 1544  WBC 9.5 7.7 8.3  NEUTROABS  --  3 2.8  HGB 13.0* 13.1* 13.8  HCT 40* 41 40.9  MCV  --   --  87.4  PLT 165 156 157    Lab Results  Component Value Date   HGBA1C 6 04/02/2017   Lab Results  Component Value Date   CHOL 94 09/14/2016   HDL 30 (A) 09/14/2016   LDLCALC 48 09/14/2016   TRIG 81 09/14/2016    CHOLHDL 3.2 10/24/2015     Assessment/Plan  1. Gout, unspecified cause, unspecified chronicity, unspecified site - continue allopurinol 150 mg 1 tab daily   2. Mood disorder (HCC) - mood is stable, continue Depakote 125 mg 1 capsule twice a day   3. History of CVA (cerebrovascular accident) - with left hemiplegia, continue aspirin 325 mg daily, atenolol 50 mg 1 tab twice a day, enalapril 20 mg 1 tab daily, amlodipine 10 mg 1 tab daily,  and Lipitor 80 mg daily at bedtime   4. Essential hypertension - well-controlled, continue atenolol 50 mg twice a day, enalapril 20 mg 1 tab daily and amlodipine 10 mg 1 tab daily   5. Depressive psychosis ([place]) - continue Quetiapine 25 mg 1 tablet twice a day   6. Vitamin D deficiency - continue vitamin D3 1000 units 1 tab daily   7. Dysphagia due to recent stroke - continue NPO, medications via PEG tube, Isosource 1.5 given 2 cans via PEG tube 3 times a day, aspiration precautions     Family/ staff Communication: Discussed plan of care with resident and charge nurse.  Labs/tests ordered:  Check vitamin D level  Goals of care:  Long-term care   Durenda Age, NP [facility] and Adult Medicine [phone] (Monday-Friday 8:00 a.m. - 5:00 p.m.) [phone] (after hours)

## 2017-05-14 NOTE — Progress Notes (Deleted)
Location:  [place] Room Number: 122-A Place of Service:  SNF (31) Provider:  Durenda Age, NP  Patient Care Team: [name], MD as PCP - General (Internal Medicine) [place] as Referring Physician (General Practice) Letta Pate Luanna Salk, MD as Consulting Physician (Physical Medicine and Rehabilitation) Rosalin Hawking, MD as Consulting Physician (Neurology)  Extended Emergency Contact Information Primary Emergency Contact: [place] of [place] Phone: [phone] Relation: Brother  Code Status:  Full Code  Goals of care: Advanced Directive information Advanced Directives 03/09/2017  Does Patient Have a Medical Advance Directive? No  Type of Advance Directive -  Does patient want to make changes to medical advance directive? -  Copy of [place] in Chart? -  Would patient like information on creating a medical advance directive? -     Chief Complaint  Patient presents with  . Medical Management of Chronic Issues    Routine [facility] visit    HPI:  Pt is a 67 y.o. male seen today for medical management of chronic diseases.  He is a long-term care resident of [facility].  He has a PMH of CVA, atherosclerosis, CAD, DM, hypertension, and hyperlipidemia.    Past Medical History:  Diagnosis Date  . Diabetes mellitus without complication ([place])   . Dysarthria   . Dysphagia   . GI bleed   . Hyperlipidemia   . Hypertension   . Renal insufficiency 09/22/2016  . Stroke [facility])    Past Surgical History:  Procedure Laterality Date  . ESOPHAGOGASTRODUODENOSCOPY (EGD) WITH PROPOFOL N/A 11/03/2015   Procedure: ESOPHAGOGASTRODUODENOSCOPY (EGD) WITH PROPOFOL;  Surgeon: Manus Gunning, MD;  Location: [place];  Service: Gastroenterology;  Laterality: N/A;  . IR REPLC GASTRO/COLONIC TUBE PERCUT W/FLUORO  12/29/2016    No Known Allergies  Outpatient Encounter  Medications as of 05/14/2017  Medication Sig  . acetaminophen (TYLENOL) 650 MG CR tablet 650 mg every 6 (six) hours as needed for pain or fever.   Marland Kitchen allopurinol (ZYLOPRIM) 150 mg TABS tablet Place 150 mg into feeding tube daily.  Marland Kitchen amLODipine (NORVASC) 10 MG tablet Place 10 mg into feeding tube daily.  Marland Kitchen aspirin 325 MG tablet Place 325 mg into feeding tube daily.  Marland Kitchen atenolol (TENORMIN) 50 MG tablet Place 50 mg into feeding tube 2 (two) times daily.  Marland Kitchen atorvastatin (LIPITOR) 80 MG tablet Place 80 mg into feeding tube daily.   . carboxymethylcellulose (REFRESH PLUS) 0.5 % SOLN Place 1 drop into both eyes 3 (three) times daily as needed (dry eyes).  . cholecalciferol (VITAMIN D) 1000 UNITS tablet 1,000 Units daily. Give via PEG  . divalproex (DEPAKOTE) 125 MG DR tablet Take 125 mg by mouth 2 (two) times daily.  . enalapril (VASOTEC) 20 MG tablet Take 1 tablet (20 mg total) by mouth daily.  . folic acid (FOLVITE) 1 MG tablet Place 1 mg into feeding tube daily.   . Nutritional Supplements (ISOSOURCE 1.5 CAL) LIQD Give 2 cans three times daily bolus via PEG tub with 30 ml flushes before and after feeding and medications. This will provide 2250 Kcal per day.  Marland Kitchen QUEtiapine (SEROQUEL) 25 MG tablet Place 25 mg into feeding tube 2 (two) times daily. 1200 and 1800   . ranitidine (ZANTAC) 15 MG/ML syrup Place into feeding tube 2 (two) times daily. Take 5 mL q12h  . [DISCONTINUED] divalproex (DEPAKOTE) 250 MG DR tablet 250 mg 2 (two) times daily.  No facility-administered encounter medications on file as of 05/14/2017.     Review of Systems  GENERAL: No change in appetite, no fatigue, no weight changes, no fever, chills or weakness SKIN: Denies rash, itching, wounds, ulcer sores, or nail abnormalities EYES: Denies change in vision, dry eyes, eye pain, itching or discharge EARS: Denies change in hearing, ringing in ears, or earache NOSE: Denies nasal congestion or epistaxis MOUTH and THROAT: Denies oral  discomfort, gingival pain or bleeding, pain from teeth or hoarseness   RESPIRATORY: no cough, SOB, DOE, wheezing, hemoptysis CARDIAC: No chest pain, edema or palpitations GI: No abdominal pain, diarrhea, constipation, heart burn, nausea or vomiting GU: Denies dysuria, frequency, hematuria, incontinence, or discharge MUSCULOSKELETAL: Denies joint pain, muscle pain, back pain, restricted movement, or unusual weakness CIRCULATION: Denies claudication, edema of legs, varicosities, or cold extremities NEUROLOGICAL: Denies dizziness, syncope, numbness, or headache PSYCHIATRIC: Denies feelings of depression or anxiety. No report of hallucinations, insomnia, paranoia, or agitation ENDOCRINE: Denies polyphagia, polyuria, polydipsia, heat or cold intolerance HEME/LYMPH: Denies excessive bruising, petechia, enlarged lymph nodes, or bleeding problems IMMUNOLOGIC: Denies history of frequent infections, AIDS, or use of immunosuppressive agents   Immunization History  Administered Date(s) Administered  . Influenza-Unspecified 06/21/2015  . PPD Test 11/08/2015  . Pneumococcal-Unspecified 06/21/2015   Pertinent  Health Maintenance Due  Topic Date Due  . PNA vac Low Risk Adult (1 of 2 - PCV13) 06/20/2016  . INFLUENZA VACCINE  12/06/2016  . OPHTHALMOLOGY EXAM  02/16/2017  . COLONOSCOPY  05/08/2018 (Originally 10/15/2000)  . FOOT EXAM  07/05/2018 (Originally 10/15/1960)  . HEMOGLOBIN A1C  09/30/2017   Fall Risk  03/09/2017 01/02/2017 11/15/2016 10/13/2016 08/29/2016  Falls in the past year? Exclusion - non ambulatory Yes No Yes Yes  Comment - - - - -  Number falls in past yr: - 2 or more - 2 or more 2 or more  Comment - - - - -  Injury with Fall? - No - No No  Risk Factor Category  - - - High Fall Risk High Fall Risk  Risk for fall due to : - - - - Impaired balance/gait;Impaired mobility;Mental status change  Follow up - - - - -      Vitals:   05/14/17 1450  BP: 133/79  Pulse: 60  Resp: 18  Temp:  98.7 F (37.1 C)  TempSrc: Oral  SpO2: 94%  Weight: 172 lb 3.2 oz (78.1 kg)  Height: 5\' 9"  (1.753 m)   Body mass index is 25.43 kg/m.  Physical Exam  GENERAL APPEARANCE: Well nourished. In no acute distress. Normal body habitus SKIN:  Skin is warm and dry. There are no suspicious lesions or rash HEAD: Normal in size and contour. No evidence of trauma EYES: Lids open and close normally. No blepharitis, entropion or ectropion. PERRL. Conjunctivae are clear and sclerae are white. Lenses are without opacity EARS: Pinnae are normal. Patient hears normal voice tunes of the examiner MOUTH and THROAT: Lips are without lesions. Oral mucosa is moist and without lesions. Tongue is normal in shape, size, and color and without lesions NECK: supple, trachea midline, no neck masses, no thyroid tenderness, no thyromegaly LYMPHATICS: No LAN in the neck, no supraclavicular LAN RESPIRATORY: Breathing is even & unlabored, BS CTAB CARDIAC: RRR, no murmur,no extra heart sounds, no edema GI: Abdomen soft, normal BS, no masses, no tenderness, no hepatomegaly, no splenomegaly MUSCULOSKELETAL: No deformities. Movement at each extremity is full and painless. Strength is 5/5 at each extremity. Back  is without kyphosis or scoliosis CIRCULATION: Pedal pulses are 2+. There is no edema of the legs, ankles and feet NEUROLOGICAL: There is no tremor. Speech is clear PSYCHIATRIC: Alert and oriented X 3. Affect and behavior are appropriate  Labs reviewed: Recent Labs    09/28/16 01/10/17 03/28/17 1544  NA 140 136* 135  K 4.5 4.8 4.3  CL  --   --  106  CO2  --   --  24  GLUCOSE  --   --  108*  BUN 36* 38* 34*  CREATININE 1.2 1.2 1.20  CALCIUM  --   --  8.8*   Recent Labs    01/10/17  AST 31  ALT 26  ALKPHOS 126*   Recent Labs    09/28/16 01/10/17 03/28/17 1544  WBC 9.5 7.7 8.3  NEUTROABS  --  3 2.8  HGB 13.0* 13.1* 13.8  HCT 40* 41 40.9  MCV  --   --  87.4  PLT 165 156 157    Lab Results    Component Value Date   HGBA1C 6 04/02/2017   Lab Results  Component Value Date   CHOL 94 09/14/2016   HDL 30 (A) 09/14/2016   LDLCALC 48 09/14/2016   TRIG 81 09/14/2016   CHOLHDL 3.2 10/24/2015   Assessment/Plan ***   Family/ staff Communication: ***  Labs/tests ordered:  ***  Goals of care:   ***   Durenda Age, NP [facility] and Adult Medicine [phone] (Monday-Friday 8:00 a.m. - 5:00 p.m.) [phone] (after hours)  This encounter was created in error - please disregard.

## 2017-05-17 DIAGNOSIS — E559 Vitamin D deficiency, unspecified: Secondary | ICD-10-CM | POA: Diagnosis not present

## 2017-05-17 DIAGNOSIS — E119 Type 2 diabetes mellitus without complications: Secondary | ICD-10-CM | POA: Diagnosis not present

## 2017-05-17 DIAGNOSIS — E785 Hyperlipidemia, unspecified: Secondary | ICD-10-CM | POA: Diagnosis not present

## 2017-05-17 DIAGNOSIS — D649 Anemia, unspecified: Secondary | ICD-10-CM | POA: Diagnosis not present

## 2017-05-17 LAB — VITAMIN D 25 HYDROXY (VIT D DEFICIENCY, FRACTURES): Vit D, 25-Hydroxy: 26.43

## 2017-05-21 ENCOUNTER — Ambulatory Visit: Payer: Medicare Other | Admitting: Nurse Practitioner

## 2017-05-21 NOTE — Progress Notes (Signed)
61

## 2017-05-21 NOTE — Progress Notes (Signed)
[facility]  PATIENT: Sean Collins DOB: Aug 17, 1950   REASON FOR VISIT: History of stroke in 2013 and 2016 HISTORY FROM: Patient and brother    HISTORY OF PRESENT ILLNESS: SeanSean Collins a 67 y.o.malewith history of HTN, HLD, pre-DM, previous stroke, MI s/p stent, current smoker was admitted on 10/23/15 for slurry speech and gait imbalance. MRI found to have right BG/CR punctate infarcts. MRA showed diffuse athero including right M2, BA, bilateral PCA. Carotid Doppler 40-59% right ICA stenosis. CT of the neck showed right ICA 50% stenosis with soft plaques, left ICA 40% stenosis and mild bilateral VA stenosis. TTE with EF 55-60%, and DVT negative. LDL 83 and A1c 6.1. Head aspirin 81 and Plavix PTA, increase to aspirin 325 and continue Plavix for both cardiac and stroke prevention. Resumed Lipitor. During the admission, patient symptoms getting worse, progress to left hemiplegia and severe dysarthria and dysphagia. MRI showed more prominent right BG/CR infarction. Failed swallow and CXR concerning for bilateral aspiration pneumonia, put on Unasyn and underwent PEG placement. VVS consulted and recommended follow-up as outpatient to consider right CEA. He subsequently had hematemesis (2 cups worth) and melena and was transferred to the ICU. He received 2 units PRBCs. GI performed an EGD on 6/28 which revealed active bleeding in the gastric body related to the PEG tube site. The bleeding resolved after the PEG tube site was tightened. His hemoglobin has remained stable in the 9 range and he was transferred back to [facility]MT[facility] . After 2 days of him being stable, Aspirin and Plavix restarted but has one episode of bloody BM at night. Both ASA and plavix were held again with Aspirin resumed the second day but not Plavix.Patient was later discharge to SNF with PEG and tube feeding and ASA and lipitor.   History of stroke  11/2011 - left CR - MRA diffuse athero - CUS and  TTE negative - on ASA  01/2015 - right semi ovale punctate infarcts - MRA diffuse athero - CUS and TTE negative - LDL 180 and A1C 6.3 - ASA + plavix + zocor  12/28/15 follow-up - the patient has been doing stable. He still has severe dysarthria and intelligible words, has to communicate with writing. Excessive saliva and not able to swallow. Still has PEG for bolus feed. As per son and note, on 12/27/15 "With improvement in his dysphagia, bolus feeding was discontinued. He now is described as having decreased mental alertness and choking with food intake". His mental status change could be related to aspiration. Today in clinic, he has not mental status change, able to communicate with me via writing, asking "for better food" "need to change the cook" "my arm is not strong". I do not think he is safe to eat at this time and he certainly needs bolus feed via PEG to maintain enough nutrition. Follow with Dr. Oneida Alar for ICA stenosis, recommend medical treatment. BP 114/70.  05/15/16 follow up - the patient has been doing nearly the same. He still has severe dysarthria and dysphasia, still on PEG for tube feeding. Still has left UE plegia, and LLE weakness. Still in wheelchair. Follow with Dr. Laury Axon for left shoulder pain and injection, currently doing better. Still in SNF, undergoing PT/OT/speech. Able to stand up with walker but not able to walk yet. BP 106/68.   Interval History7/11/18 Dr. Erlinda Hong During the interval time, the patient has no significant improvement but seems weaker at LLE than last time, likely due to lack of activity  and motivation. Still in SNF but no more therapy due to max out neuro benefit. Wheelchair bound with left hemiplegia and left hand contracture. BP today 117/65.  UPDATE 1/15/2019[name], 67 year old male returns for follow-up with history of stroke in 2013 in 2016.  He has had no real change in his neurologic status he continues to have severe dysarthria and dysphagia.  He  still has PEG tube for feedings.  He still has significant left-sided weakness unable to ambulate independently.  He is in a skilled nursing facility.  He is maxed out on his physical therapy and occupational therapy.  He has left hand contractures.  He continues to follow with Dr.Kirstens for left shoulder pain injections.  Blood pressure in the office today 134/82.  He remains on aspirin and Lipitor for secondary stroke prevention.  He has minimal bruising and no bleeding.  Brother states he gets up at least daily in a wheelchair.  He returns for reevaluation  REVIEW OF SYSTEMS: Full 14 system review of systems performed and notable only for those listed, all others are neg:  Constitutional: neg  Cardiovascular: neg Ear/Nose/Throat: neg  Skin: neg Eyes: neg Respiratory: neg Gastroitestinal: PEG tube feedings Hematology/Lymphatic: neg  Endocrine: neg Musculoskeletal:neg Allergy/Immunology: neg Neurological: Stroke x2 significant dysarthria dysphagia Psychiatric: neg Sleep : neg   ALLERGIES: No Known Allergies  HOME MEDICATIONS: Outpatient Medications Prior to Visit  Medication Sig Dispense Refill  . acetaminophen (TYLENOL) 650 MG CR tablet 650 mg every 6 (six) hours as needed for pain or fever.     Marland Kitchen allopurinol (ZYLOPRIM) 150 mg TABS tablet Place 150 mg into feeding tube daily.    Marland Kitchen amLODipine (NORVASC) 10 MG tablet Place 10 mg into feeding tube daily.    Marland Kitchen aspirin 325 MG tablet Place 325 mg into feeding tube daily.    Marland Kitchen atenolol (TENORMIN) 50 MG tablet Place 50 mg into feeding tube 2 (two) times daily.    Marland Kitchen atorvastatin (LIPITOR) 80 MG tablet Place 80 mg into feeding tube daily.     . carboxymethylcellulose (REFRESH PLUS) 0.5 % SOLN Place 1 drop into both eyes 3 (three) times daily as needed (dry eyes).    . cholecalciferol (VITAMIN D) 1000 UNITS tablet 1,000 Units daily. Give via PEG    . divalproex (DEPAKOTE) 125 MG DR tablet Take 125 mg by mouth 2 (two) times daily.    .  enalapril (VASOTEC) 20 MG tablet Take 1 tablet (20 mg total) by mouth daily. 30 tablet 2  . folic acid (FOLVITE) 1 MG tablet Place 1 mg into feeding tube daily.     . Nutritional Supplements (ISOSOURCE 1.5 CAL) LIQD Give 2 cans three times daily bolus via PEG tub with 30 ml flushes before and after feeding and medications. This will provide 2250 Kcal per day.    Marland Kitchen QUEtiapine (SEROQUEL) 25 MG tablet Place 25 mg into feeding tube 2 (two) times daily. 1200 and 1800     . ranitidine (ZANTAC) 15 MG/ML syrup Place into feeding tube 2 (two) times daily. Take 5 mL q12h     No facility-administered medications prior to visit.     PAST MEDICAL HISTORY: Past Medical History:  Diagnosis Date  . Diabetes mellitus without complication ([place])   . Dysarthria   . Dysphagia   . GI bleed   . Hyperlipidemia   . Hypertension   . Renal insufficiency 09/22/2016  . Stroke [facility])     PAST SURGICAL HISTORY: Past Surgical History:  Procedure  Laterality Date  . ESOPHAGOGASTRODUODENOSCOPY (EGD) WITH PROPOFOL N/A 11/03/2015   Procedure: ESOPHAGOGASTRODUODENOSCOPY (EGD) WITH PROPOFOL;  Surgeon: Manus Gunning, MD;  Location: [place];  Service: Gastroenterology;  Laterality: N/A;  . IR REPLC GASTRO/COLONIC TUBE PERCUT W/FLUORO  12/29/2016    FAMILY HISTORY: Family History  Problem Relation Age of Onset  . Stroke Brother   . Diabetes Brother   . Stroke Brother   . Stroke Maternal Aunt     SOCIAL HISTORY: Social History   Socioeconomic History  . Marital status: Legally Separated    Spouse name: Not on file  . Number of children: Not on file  . Years of education: Not on file  . Highest education level: Not on file  Social Needs  . Financial resource strain: Not on file  . Food insecurity - worry: Not on file  . Food insecurity - inability: Not on file  . Transportation needs - medical: Not on file  . Transportation needs - non-medical: Not on file  Occupational History  . Not on file    Tobacco Use  . Smoking status: Former Smoker    Packs/day: 0.50    Years: 32.00    Pack years: 16.00    Types: Cigarettes  . Smokeless tobacco: Never Used  . Tobacco comment: Started at age 14 though quit for 10 years at one point  Substance and Sexual Activity  . Alcohol use: No    Alcohol/week: 0.0 oz  . Drug use: No  . Sexual activity: Not Currently  Other Topics Concern  . Not on file  Social History Narrative   Works as a Catering manager of the [employer] and follows at the [place] in [place] and [place] in the past     [place]:   05/22/17 1401  BP: 134/82  Pulse: 62  There is no height or weight on file to calculate BMI.   Generalized: Well developed, in no acute distress  Head: normocephalic and atraumatic,. Oropharynx dry mucous membranes Neck: Supple, no carotid bruits  Cardiac: Regular rate rhythm, no murmur  Musculoskeletal: No deformity   Neurological examination   Mentation: Awake, Alert severe dysarthria not able to answer orientation questions   Follows all commands, able to write for communication   Cranial nerve II-XII: Pupils were equal round reactive to light extraocular movements were full, visual field were full on confrontational test.  Mild left facial droop  hearing was intact to finger rubbing bilaterally.  Palate elevation impaired  head turning and shoulder shrug were normal and symmetric.Unable to protrude tongue . Motor: 0 out of 5 left upper extremity and lower left extremity , 5 out of 5 right upper extremity, 4 out of 5 right lower extremity.  Increased tone on left upper extremity  Sensory: normal and symmetric to light touch, pinprick, and  Vibration, in the upper and lower extremities Coordination: finger-nose-finger, normal on the right unable to perform on the left Reflexes: 1+ upper lower and symmetric, plantar responses were flexor bilaterally. Gait and Station: In wheelchair not tested  due to safety concerns  DIAGNOSTIC DATA (LABS, IMAGING, TESTING) - I reviewed patient records, labs, notes, testing and imaging myself where available.  Lab Results  Component Value Date   WBC 8.3 03/28/2017   HGB 13.8 03/28/2017   HCT 40.9 03/28/2017   MCV 87.4 03/28/2017   PLT 157 03/28/2017      Component Value Date/Time   NA  135 03/28/2017 1544   NA 136 (A) 01/10/2017   K 4.3 03/28/2017 1544   CL 106 03/28/2017 1544   CO2 24 03/28/2017 1544   GLUCOSE 108 (H) 03/28/2017 1544   BUN 34 (H) 03/28/2017 1544   BUN 38 (A) 01/10/2017   CREATININE 1.20 03/28/2017 1544   CALCIUM 8.8 (L) 03/28/2017 1544   PROT 7.1 10/23/2015 1442   ALBUMIN 2.4 (L) 11/06/2015 0515   AST 31 01/10/2017   ALT 26 01/10/2017   ALKPHOS 126 (A) 01/10/2017   BILITOT 0.4 10/23/2015 1442   GFRNONAA >60 03/28/2017 1544   GFRAA >60 03/28/2017 1544   Lab Results  Component Value Date   CHOL 94 09/14/2016   HDL 30 (A) 09/14/2016   LDLCALC 48 09/14/2016   TRIG 81 09/14/2016   CHOLHDL 3.2 10/24/2015   Lab Results  Component Value Date   HGBA1C 6 04/02/2017    ASSESSMENT AND PLAN  67 y.o. African American male with PMH of HTN, HLD, pre-DM, previous stroke at left CR in 2013 and right SO in 2016, MI s/p stent, smoker was admitted on 10/23/15 for right BG/CR punctate infarcts. MRA showed diffuse athero including right M2, BA, bilateral PCA. Carotid Doppler 40-59% right ICA stenosis. CT of the neck showed right ICA 50% stenosis with soft plaques, left ICA 40% stenosis and mild bilateral VA stenosis. TTE with EF 55-60%, and DVT negative. LDL 83 and A1c 6.1. Had aspirin 81 and Plavix PTA, increase to aspirin 325 and continue Plavix for both cardiac and stroke prevention. Resumed Lipitor. During the admission, patient symptoms getting worse, progress to left hemiplegia and severe dysarthria and dysphagia. MRI showed more prominent right BG/CR infarction. Failed swallow and underwent PEG placement. VVS consulted and  recommended follow-up as outpatient to consider right CEA in the future. He subsequently developed GI bleeding requiring blood transfusion. Aspirin resumed but not Plavix.Patient was later discharge to SNF with PEG and tube feeding along with ASA and lipitor. During interval time, he still has severe dysarthria and intelligible words, has to communicate with writing. Still has dysphagia and needs PEG for bolus feed. Follow with VVS for right ICA stenosis and recommend medical treatment.  The patient is a current patient of Dr. Erlinda Hong  who is out of the office today . This note is sent to the work in doctor.     Plan:  -Continue ASA and lipitor for stroke prevention.  -Blood pressure in the office today 134/82 , check BP at facility daily and record -Continue getting up patient every day , encourage self exercise in SNF to maintain the function. - Follow up with your primary care physician for stroke risk factor modification. Recommend maintain blood pressure goal 130-150/80, diabetes with hemoglobin A1c goal below 7.0% and lipids with LDL cholesterol goal below 70 mg/dL. Continue tube feeding and avoid aspiration  Discharge from stroke clinic.  No further stroke symptoms. I spent 25 minutes in total face to face time with the patient/brother more than 50% of which was spent counseling and coordination of care, reviewing test results reviewing medications and discussing and reviewing the diagnosis of stroke and management of risk factors.  Importance of getting patient up daily to prevent complications  from bedrest. Dennie Bible, [facility], [facility], APRN  [facility] [address], [place], [place] [address] [phone]

## 2017-05-21 NOTE — Progress Notes (Deleted)
[facility]  PATIENT: Sean Collins DOB: 1950/05/23   REASON FOR VISIT: *** HISTORY FROM:    HISTORY OF PRESENT ILLNESS: Mr.Sean Collins a 67 y.o.malewith history of HTN, HLD, pre-DM, previous stroke, MI s/p stent, current smoker was admitted on 10/23/15 for slurry speech and gait imbalance. MRI found to have right BG/CR punctate infarcts. MRA showed diffuse athero including right M2, BA, bilateral PCA. Carotid Doppler 40-59% right ICA stenosis. CT of the neck showed right ICA 50% stenosis with soft plaques, left ICA 40% stenosis and mild bilateral VA stenosis. TTE with EF 55-60%, and DVT negative. LDL 83 and A1c 6.1. Head aspirin 81 and Plavix PTA, increase to aspirin 325 and continue Plavix for both cardiac and stroke prevention. Resumed Lipitor. During the admission, patient symptoms getting worse, progress to left hemiplegia and severe dysarthria and dysphagia. MRI showed more prominent right BG/CR infarction. Failed swallow and CXR concerning for bilateral aspiration pneumonia, put on Unasyn and underwent PEG placement. VVS consulted and recommended follow-up as outpatient to consider right CEA. He subsequently had hematemesis (2 cups worth) and melena and was transferred to the ICU. He received 2 units PRBCs. GI performed an EGD on 6/28 which revealed active bleeding in the gastric body related to the PEG tube site. The bleeding resolved after the PEG tube site was tightened. His hemoglobin has remained stable in the 9 range and he was transferred back to IMTS . After 2 days of him being stable, Aspirin and Plavix restarted but has one episode of bloody BM at night. Both ASA and plavix were held again with Aspirin resumed the second day but not Plavix.Patient was later discharge to SNF with PEG and tube feeding and ASA and lipitor.   History of stroke  11/2011 - left CR - MRA diffuse athero - CUS and TTE negative - on ASA  01/2015 - right semi ovale  punctate infarcts - MRA diffuse athero - CUS and TTE negative - LDL 180 and A1C 6.3 - ASA + plavix + zocor  12/28/15 follow-up - the patient has been doing stable. He still has severe dysarthria and intelligible words, has to communicate with writing. Excessive saliva and not able to swallow. Still has PEG for bolus feed. As per son and note, on 12/27/15 "With improvement in his dysphagia, bolus feeding was discontinued. He now is described as having decreased mental alertness and choking with food intake". His mental status change could be related to aspiration. Today in clinic, he has not mental status change, able to communicate with me via writing, asking "for better food" "need to change the cook" "my arm is not strong". I do not think he is safe to eat at this time and he certainly needs bolus feed via PEG to maintain enough nutrition. Follow with Dr. Oneida Alar for ICA stenosis, recommend medical treatment. BP 114/70.  05/15/16 follow up - the patient has been doing nearly the same. He still has severe dysarthria and dysphasia, still on PEG for tube feeding. Still has left UE plegia, and LLE weakness. Still in wheelchair. Follow with Dr. Laury Axon for left shoulder pain and injection, currently doing better. Still in SNF, undergoing PT/OT/speech. Able to stand up with walker but not able to walk yet. BP 106/68.   Interval History7/11/18 Dr. Erlinda Hong During the interval time, the patient has no significant improvement but seems weaker at LLE than last time, likely due to lack of activity and motivation. Still in SNF but no more therapy  due to max out neuro benefit. Wheelchair bound with left hemiplegia and left hand contracture. BP today 117/65.    REVIEW OF SYSTEMS: Full 14 system review of systems performed and notable only for those listed, all others are neg:  Constitutional: neg  Cardiovascular: neg Ear/Nose/Throat: neg  Skin: neg Eyes: neg Respiratory: neg Gastroitestinal: neg    Hematology/Lymphatic: neg  Endocrine: neg Musculoskeletal:neg Allergy/Immunology: neg Neurological: neg Psychiatric: neg Sleep : neg   ALLERGIES: No Known Allergies  HOME MEDICATIONS: Outpatient Medications Prior to Visit  Medication Sig Dispense Refill  . acetaminophen (TYLENOL) 650 MG CR tablet 650 mg every 6 (six) hours as needed for pain or fever.     Marland Kitchen allopurinol (ZYLOPRIM) 150 mg TABS tablet Place 150 mg into feeding tube daily.    Marland Kitchen amLODipine (NORVASC) 10 MG tablet Place 10 mg into feeding tube daily.    Marland Kitchen aspirin 325 MG tablet Place 325 mg into feeding tube daily.    Marland Kitchen atenolol (TENORMIN) 50 MG tablet Place 50 mg into feeding tube 2 (two) times daily.    Marland Kitchen atorvastatin (LIPITOR) 80 MG tablet Place 80 mg into feeding tube daily.     . carboxymethylcellulose (REFRESH PLUS) 0.5 % SOLN Place 1 drop into both eyes 3 (three) times daily as needed (dry eyes).    . cholecalciferol (VITAMIN D) 1000 UNITS tablet 1,000 Units daily. Give via PEG    . divalproex (DEPAKOTE) 125 MG DR tablet Take 125 mg by mouth 2 (two) times daily.    . enalapril (VASOTEC) 20 MG tablet Take 1 tablet (20 mg total) by mouth daily. 30 tablet 2  . folic acid (FOLVITE) 1 MG tablet Place 1 mg into feeding tube daily.     . Nutritional Supplements (ISOSOURCE 1.5 CAL) LIQD Give 2 cans three times daily bolus via PEG tub with 30 ml flushes before and after feeding and medications. This will provide 2250 Kcal per day.    Marland Kitchen QUEtiapine (SEROQUEL) 25 MG tablet Place 25 mg into feeding tube 2 (two) times daily. 1200 and 1800     . ranitidine (ZANTAC) 15 MG/ML syrup Place into feeding tube 2 (two) times daily. Take 5 mL q12h     No facility-administered medications prior to visit.     PAST MEDICAL HISTORY: Past Medical History:  Diagnosis Date  . Diabetes mellitus without complication ([place])   . Dysarthria   . Dysphagia   . GI bleed   . Hyperlipidemia   . Hypertension   . Renal insufficiency 09/22/2016  .  Stroke [facility])     PAST SURGICAL HISTORY: Past Surgical History:  Procedure Laterality Date  . ESOPHAGOGASTRODUODENOSCOPY (EGD) WITH PROPOFOL N/A 11/03/2015   Procedure: ESOPHAGOGASTRODUODENOSCOPY (EGD) WITH PROPOFOL;  Surgeon: Manus Gunning, MD;  Location: [facility];  Service: Gastroenterology;  Laterality: N/A;  . IR REPLC GASTRO/COLONIC TUBE PERCUT W/FLUORO  12/29/2016    FAMILY HISTORY: Family History  Problem Relation Age of Onset  . Stroke Brother   . Diabetes Brother   . Stroke Brother   . Stroke Maternal Aunt     SOCIAL HISTORY: Social History   Socioeconomic History  . Marital status: Legally Separated    Spouse name: Not on file  . Number of children: Not on file  . Years of education: Not on file  . Highest education level: Not on file  Social Needs  . Financial resource strain: Not on file  . Food insecurity - worry: Not on file  . Food  insecurity - inability: Not on file  . Transportation needs - medical: Not on file  . Transportation needs - non-medical: Not on file  Occupational History  . Not on file  Tobacco Use  . Smoking status: Former Smoker    Packs/day: 0.50    Years: 32.00    Pack years: 16.00    Types: Cigarettes  . Smokeless tobacco: Never Used  . Tobacco comment: Started at age 71 though quit for 10 years at one point  Substance and Sexual Activity  . Alcohol use: No    Alcohol/week: 0.0 oz  . Drug use: No  . Sexual activity: Not Currently  Other Topics Concern  . Not on file  Social History Narrative   Works as a Catering manager of the [employer] and follows at the [facility] in [place] and [place] in the past     [place]  There were no vitals filed for this visit. There is no height or weight on file to calculate BMI.  Generalized: Well developed, in no acute distress  Head: normocephalic and atraumatic,. Oropharynx benign  Neck: Supple, no carotid bruits  Cardiac: Regular rate  rhythm, no murmur  Musculoskeletal: No deformity   Neurological examination   Mentation: Alert oriented to time, place, history taking. Attention span and concentration appropriate. Recent and remote memory intact.  Follows all commands speech and language fluent.   Cranial nerve II-XII: Fundoscopic exam reveals sharp disc margins.Pupils were equal round reactive to light extraocular movements were full, visual field were full on confrontational test. Facial sensation and strength were normal. hearing was intact to finger rubbing bilaterally. Uvula tongue midline. head turning and shoulder shrug were normal and symmetric.Tongue protrusion into cheek strength was normal. Motor: normal bulk and tone, full strength in the BUE, BLE, fine finger movements normal, no pronator drift. No focal weakness Sensory: normal and symmetric to light touch, pinprick, and  Vibration, proprioception  Coordination: finger-nose-finger, heel-to-shin bilaterally, no dysmetria Reflexes: Brachioradialis 2/2, biceps 2/2, triceps 2/2, patellar 2/2, Achilles 2/2, plantar responses were flexor bilaterally. Gait and Station: Rising up from seated position without assistance, normal stance,  moderate stride, good arm swing, smooth turning, able to perform tiptoe, and heel walking without difficulty. Tandem gait is steady  DIAGNOSTIC DATA (LABS, IMAGING, TESTING) - I reviewed patient records, labs, notes, testing and imaging myself where available.  Lab Results  Component Value Date   WBC 8.3 03/28/2017   HGB 13.8 03/28/2017   HCT 40.9 03/28/2017   MCV 87.4 03/28/2017   PLT 157 03/28/2017      Component Value Date/Time   NA 135 03/28/2017 1544   NA 136 (A) 01/10/2017   K 4.3 03/28/2017 1544   CL 106 03/28/2017 1544   CO2 24 03/28/2017 1544   GLUCOSE 108 (H) 03/28/2017 1544   BUN 34 (H) 03/28/2017 1544   BUN 38 (A) 01/10/2017   CREATININE 1.20 03/28/2017 1544   CALCIUM 8.8 (L) 03/28/2017 1544   PROT 7.1  10/23/2015 1442   ALBUMIN 2.4 (L) 11/06/2015 0515   AST 31 01/10/2017   ALT 26 01/10/2017   ALKPHOS 126 (A) 01/10/2017   BILITOT 0.4 10/23/2015 1442   GFRNONAA >60 03/28/2017 1544   GFRAA >60 03/28/2017 1544   Lab Results  Component Value Date   CHOL 94 09/14/2016   HDL 30 (A) 09/14/2016   LDLCALC 48 09/14/2016   TRIG 81 09/14/2016   CHOLHDL 3.2 10/24/2015  Lab Results  Component Value Date   HGBA1C 6 04/02/2017    ASSESSMENT AND PLAN  67 y.o. year old male  has a past medical history of Diabetes mellitus without complication ([place]), Dysarthria, Dysphagia, GI bleed, Hyperlipidemia, Hypertension, Renal insufficiency (09/22/2016), and Stroke ([place]). here with ***  67 y.o. African American male with PMH of HTN, HLD, pre-DM, previous stroke at left CR in 2013 and right SO in 2016, MI s/p stent, smoker was admitted on 10/23/15 for right BG/CR punctate infarcts. MRA showed diffuse athero including right M2, BA, bilateral PCA. Carotid Doppler 40-59% right ICA stenosis. CT of the neck showed right ICA 50% stenosis with soft plaques, left ICA 40% stenosis and mild bilateral VA stenosis. TTE with EF 55-60%, and DVT negative. LDL 83 and A1c 6.1. Had aspirin 81 and Plavix PTA, increase to aspirin 325 and continue Plavix for both cardiac and stroke prevention. Resumed Lipitor. During the admission, patient symptoms getting worse, progress to left hemiplegia and severe dysarthria and dysphagia. MRI showed more prominent right BG/CR infarction. Failed swallow and underwent PEG placement. VVS consulted and recommended follow-up as outpatient to consider right CEA in the future. He subsequently developed GI bleeding requiring blood transfusion. Aspirin resumed but not Plavix.Patient was later discharge to SNF with PEG and tube feeding along with ASA and lipitor. During interval time, he still has severe dysarthria and intelligible words, has to communicate with writing. Still has dysphagia and needs PEG for  bolus feed. Follow with VVS for right ICA stenosis and recommend medication treatment.    Plan:  - continue ASA and lipitor for stroke prevention.  - check BP at facility daily and record - continue PT/OT or encourage self exercise in SNF to maintain the function. - Follow up with your primary care physician for stroke risk factor modification. Recommend maintain blood pressure goal 130-150/80, diabetes with hemoglobin A1c goal below 7.0% and lipids with LDL cholesterol goal below 70 mg/dL. - continue tube feeding and avoid aspiration  - follow up in 6 months with carolyn. D/c from clinic if stabilized.    Dennie Bible, [facility], [facility], APRN  [facility] [address] [place], [place] [address] [phone]

## 2017-05-22 ENCOUNTER — Ambulatory Visit (INDEPENDENT_AMBULATORY_CARE_PROVIDER_SITE_OTHER): Payer: Medicare Other | Admitting: Nurse Practitioner

## 2017-05-22 ENCOUNTER — Encounter: Payer: Self-pay | Admitting: Nurse Practitioner

## 2017-05-22 VITALS — BP 134/82 | HR 62

## 2017-05-22 DIAGNOSIS — E782 Mixed hyperlipidemia: Secondary | ICD-10-CM

## 2017-05-22 DIAGNOSIS — Z8673 Personal history of transient ischemic attack (TIA), and cerebral infarction without residual deficits: Secondary | ICD-10-CM

## 2017-05-22 DIAGNOSIS — I69391 Dysphagia following cerebral infarction: Secondary | ICD-10-CM

## 2017-05-22 DIAGNOSIS — I6990 Unspecified sequelae of unspecified cerebrovascular disease: Secondary | ICD-10-CM | POA: Diagnosis not present

## 2017-05-22 DIAGNOSIS — G8194 Hemiplegia, unspecified affecting left nondominant side: Secondary | ICD-10-CM | POA: Diagnosis not present

## 2017-05-22 NOTE — Patient Instructions (Signed)
Per nsg home sheet

## 2017-05-23 NOTE — Progress Notes (Signed)
I agree with the above plan 

## 2017-06-12 DIAGNOSIS — R569 Unspecified convulsions: Secondary | ICD-10-CM | POA: Diagnosis not present

## 2017-06-12 DIAGNOSIS — I10 Essential (primary) hypertension: Secondary | ICD-10-CM | POA: Diagnosis not present

## 2017-06-28 DIAGNOSIS — M79642 Pain in left hand: Secondary | ICD-10-CM | POA: Diagnosis not present

## 2017-06-29 DIAGNOSIS — M25542 Pain in joints of left hand: Secondary | ICD-10-CM | POA: Diagnosis not present

## 2017-06-29 DIAGNOSIS — B351 Tinea unguium: Secondary | ICD-10-CM | POA: Diagnosis not present

## 2017-06-29 DIAGNOSIS — R262 Difficulty in walking, not elsewhere classified: Secondary | ICD-10-CM | POA: Diagnosis not present

## 2017-06-29 DIAGNOSIS — I69354 Hemiplegia and hemiparesis following cerebral infarction affecting left non-dominant side: Secondary | ICD-10-CM | POA: Diagnosis not present

## 2017-06-29 DIAGNOSIS — I739 Peripheral vascular disease, unspecified: Secondary | ICD-10-CM | POA: Diagnosis not present

## 2017-06-29 LAB — HM DIABETES FOOT EXAM

## 2017-07-02 DIAGNOSIS — I69354 Hemiplegia and hemiparesis following cerebral infarction affecting left non-dominant side: Secondary | ICD-10-CM | POA: Diagnosis not present

## 2017-07-02 DIAGNOSIS — M25542 Pain in joints of left hand: Secondary | ICD-10-CM | POA: Diagnosis not present

## 2017-07-03 DIAGNOSIS — F419 Anxiety disorder, unspecified: Secondary | ICD-10-CM | POA: Diagnosis not present

## 2017-07-03 DIAGNOSIS — M25542 Pain in joints of left hand: Secondary | ICD-10-CM | POA: Diagnosis not present

## 2017-07-03 DIAGNOSIS — F39 Unspecified mood [affective] disorder: Secondary | ICD-10-CM | POA: Diagnosis not present

## 2017-07-03 DIAGNOSIS — F339 Major depressive disorder, recurrent, unspecified: Secondary | ICD-10-CM | POA: Diagnosis not present

## 2017-07-03 DIAGNOSIS — I69354 Hemiplegia and hemiparesis following cerebral infarction affecting left non-dominant side: Secondary | ICD-10-CM | POA: Diagnosis not present

## 2017-07-04 ENCOUNTER — Non-Acute Institutional Stay (SKILLED_NURSING_FACILITY): Payer: Medicare Other | Admitting: Adult Health

## 2017-07-04 ENCOUNTER — Encounter: Payer: Self-pay | Admitting: Adult Health

## 2017-07-04 DIAGNOSIS — G8114 Spastic hemiplegia affecting left nondominant side: Secondary | ICD-10-CM | POA: Diagnosis not present

## 2017-07-04 DIAGNOSIS — E559 Vitamin D deficiency, unspecified: Secondary | ICD-10-CM

## 2017-07-04 DIAGNOSIS — I69354 Hemiplegia and hemiparesis following cerebral infarction affecting left non-dominant side: Secondary | ICD-10-CM | POA: Diagnosis not present

## 2017-07-04 DIAGNOSIS — I69391 Dysphagia following cerebral infarction: Secondary | ICD-10-CM | POA: Diagnosis not present

## 2017-07-04 DIAGNOSIS — Z8673 Personal history of transient ischemic attack (TIA), and cerebral infarction without residual deficits: Secondary | ICD-10-CM

## 2017-07-04 DIAGNOSIS — I10 Essential (primary) hypertension: Secondary | ICD-10-CM | POA: Diagnosis not present

## 2017-07-04 DIAGNOSIS — F39 Unspecified mood [affective] disorder: Secondary | ICD-10-CM | POA: Diagnosis not present

## 2017-07-04 DIAGNOSIS — M109 Gout, unspecified: Secondary | ICD-10-CM | POA: Diagnosis not present

## 2017-07-04 DIAGNOSIS — M25542 Pain in joints of left hand: Secondary | ICD-10-CM | POA: Diagnosis not present

## 2017-07-04 NOTE — Progress Notes (Signed)
Location:  [place] Room Number: 122-A Place of Service:  SNF (31) Provider:  Durenda Age, NP  Patient Care Team: [name], MD as PCP - General (Internal Medicine) [place] as Referring Physician (General Practice) Letta Pate Luanna Salk, MD as Consulting Physician (Physical Medicine and Rehabilitation) Rosalin Hawking, MD as Consulting Physician (Neurology)  Extended Emergency Contact Information Primary Emergency Contact: [place] Phone: [phone] Relation: Brother  Code Status:  Full Code  Goals of care: Advanced Directive information Advanced Directives 03/09/2017  Does Patient Have a Medical Advance Directive? No  Type of Advance Directive -  Does patient want to make changes to medical advance directive? -  Copy of [place] in Chart? -  Would patient like information on creating a medical advance directive? -     Chief Complaint  Patient presents with  . Medical Management of Chronic Issues    Routine [facility] SN[facility] visit    HPI:  Pt is a 67 y.o. male seen today for medical management of chronic diseases. He is a long-term care resident of [facility].  He has a PMH of CVA, atherosclerosis, CAD, DM, HTN, and HLD. He was seen in his room today. He was noted to have splint on his left forearm/hand. He was recently referred to OT due to left and fingers contraction. He is verbally responsive but has dysarthria.    Past Medical History:  Diagnosis Date  . Diabetes mellitus without complication ([place])   . Dysarthria   . Dysphagia   . GI bleed   . Hyperlipidemia   . Hypertension   . Renal insufficiency 09/22/2016  . Stroke [facility])    Past Surgical History:  Procedure Laterality Date  . ESOPHAGOGASTRODUODENOSCOPY (EGD) WITH PROPOFOL N/A 11/03/2015   Procedure: ESOPHAGOGASTRODUODENOSCOPY (EGD) WITH PROPOFOL;  Surgeon: Manus Gunning, MD;   Location: [facility];  Service: Gastroenterology;  Laterality: N/A;  . IR REPLC GASTRO/COLONIC TUBE PERCUT W/FLUORO  12/29/2016    No Known Allergies  Outpatient Encounter Medications as of 07/04/2017  Medication Sig  . acetaminophen (TYLENOL) 650 MG CR tablet 650 mg every 6 (six) hours as needed for pain or fever.   Marland Kitchen allopurinol (ZYLOPRIM) 150 mg TABS tablet Place 150 mg into feeding tube daily.  Marland Kitchen amLODipine (NORVASC) 10 MG tablet Place 10 mg into feeding tube daily.  Marland Kitchen aspirin 325 MG tablet Place 325 mg into feeding tube daily.  Marland Kitchen atenolol (TENORMIN) 50 MG tablet Place 50 mg into feeding tube 2 (two) times daily.  Marland Kitchen atorvastatin (LIPITOR) 80 MG tablet Place 80 mg into feeding tube daily.   . carboxymethylcellulose (REFRESH PLUS) 0.5 % SOLN Place 1 drop into both eyes 3 (three) times daily as needed (dry eyes).  . cholecalciferol (VITAMIN D) 1000 UNITS tablet 1,000 Units daily. Give via PEG  . divalproex (DEPAKOTE) 125 MG DR tablet Take 125 mg by mouth 2 (two) times daily.  . enalapril (VASOTEC) 20 MG tablet Take 1 tablet (20 mg total) by mouth daily.  . folic acid (FOLVITE) 1 MG tablet Place 1 mg into feeding tube daily.   . Menthol, Topical Analgesic, (BIOFREEZE) 4 % GEL Apply 1 application topically 2 (two) times daily. Apply to left hand QA and QPM for pain  . Nutritional Supplements (ISOSOURCE 1.5 CAL) LIQD Give 2 cans three times daily bolus via PEG tub with 30 ml flushes before and after feeding and medications. This will provide 2250  Kcal per day.  Marland Kitchen QUEtiapine (SEROQUEL) 25 MG tablet Place 25 mg into feeding tube 2 (two) times daily. 1200 and 1800   . ranitidine (ZANTAC) 15 MG/ML syrup Place into feeding tube 2 (two) times daily. Take 5 mL q12h   No facility-administered encounter medications on file as of 07/04/2017.     Review of Systems  GENERAL: No change in appetite, no fatigue, no weight changes, no fever, chills or weakness MOUTH and THROAT: Denies oral discomfort,  gingival pain or bleeding, pain from teeth or hoarseness   RESPIRATORY: no cough, SOB, DOE, wheezing, hemoptysis CARDIAC: No chest pain, edema or palpitations GI: No abdominal pain, diarrhea, constipation, heart burn, nausea or vomiting PSYCHIATRIC: Denies feelings of depression or anxiety. No report of hallucinations, insomnia, paranoia, or agitation    Immunization History  Administered Date(s) Administered  . Influenza-Unspecified 06/21/2015  . PPD Test 11/08/2015  . Pneumococcal-Unspecified 06/21/2015   Pertinent  Health Maintenance Due  Topic Date Due  . OPHTHALMOLOGY EXAM  02/16/2017  . INFLUENZA VACCINE  03/06/2018 (Originally 12/06/2016)  . COLONOSCOPY  05/08/2018 (Originally 10/15/2000)  . PNA vac Low Risk Adult (1 of 2 - PCV13) 07/04/2018 (Originally 06/20/2016)  . FOOT EXAM  07/05/2018 (Originally 10/15/1960)  . HEMOGLOBIN A1C  09/30/2017   Fall Risk  03/09/2017 01/02/2017 11/15/2016 10/13/2016 08/29/2016  Falls in the past year? Exclusion - non ambulatory Yes No Yes Yes  Comment - - - - -  Number falls in past yr: - 2 or more - 2 or more 2 or more  Comment - - - - -  Injury with Fall? - No - No No  Risk Factor Category  - - - High Fall Risk High Fall Risk  Risk for fall due to : - - - - Impaired balance/gait;Impaired mobility;Mental status change  Follow up - - - - -   Functional Status Survey:    Vitals:   07/04/17 1327  BP: 124/73  Pulse: 64  Resp: 20  Temp: 97.6 F (36.4 C)  TempSrc: Oral  SpO2: 96%  Weight: 168 lb 6.4 oz (76.4 kg)  Height: 5\' 9"  (1.753 m)   Body mass index is 24.87 kg/m.  Physical Exam  GENERAL APPEARANCE: Well nourished. In no acute distress. Normal body habitus SKIN:  Skin is warm and dry.  MOUTH and THROAT: Lips are without lesions. Oral mucosa is moist and without lesions.  RESPIRATORY: Breathing is even & unlabored, BS CTAB CARDIAC: RRR, no murmur,no extra heart sounds, no edema GI: Abdomen soft, normal BS, no masses, no tenderness,  + PEG tube EXTREMITIES:  Able to move RUE and RLE NEUROLOGICAL: Left hemiplegia, dysarthric, left hand with splint, left hand fingers contracted PSYCHIATRIC: Alert to self, disoriented to time and place. Affect and behavior are appropriate  Labs reviewed: Recent Labs    09/28/16 01/10/17 03/28/17 1544  NA 140 136* 135  K 4.5 4.8 4.3  CL  --   --  106  CO2  --   --  24  GLUCOSE  --   --  108*  BUN 36* 38* 34*  CREATININE 1.2 1.2 1.20  CALCIUM  --   --  8.8*   Recent Labs    01/10/17  AST 31  ALT 26  ALKPHOS 126*   Recent Labs    09/28/16 01/10/17 03/28/17 1544  WBC 9.5 7.7 8.3  NEUTROABS  --  3 2.8  HGB 13.0* 13.1* 13.8  HCT 40* 41 40.9  MCV  --   --  87.4  PLT 165 156 157    Lab Results  Component Value Date   HGBA1C 6 04/02/2017   Lab Results  Component Value Date   CHOL 94 09/14/2016   HDL 30 (A) 09/14/2016   LDLCALC 48 09/14/2016   TRIG 81 09/14/2016   CHOLHDL 3.2 10/24/2015    Assessment/Plan  1. History of CVA (cerebrovascular accident) - stable, continue ASA 325 mg daily, Lipitor 80 mg daily, Atenolol 50 mg BID, Enalapril 20 mg daily, amlodipine 10 mg daily, he had a neurology follow-up on 05/22/17   2. Dysphagia due to recent stroke - continue Isosource 1.5 give 2 cans TID, aspiration precaution   3. Essential hypertension - well-controlled, continue Atenolol 50 mg BID, Enalapril 20 mg daily, amlodipine 10 mg daily    4. Vitamin D deficiency - vitamin D level 26.43, continue  Vitamin D3 1,000 units 1 tab daily   5. Gout, unspecified cause, unspecified chronicity, unspecified site - continue Allopurinol 150 mg daily   6. Left spastic hemiplegia ([place]) - continue to give assistance as needed, splint to left hand/forearm   7. Mood disorder with with psychosis  ([place]) - mood is stable, continue Depakote 125 mg 1 capsule twice a day and Quetiapine 25 mg BID, followed-up by [facility], VPA 45     Family/ staff Communication:  Discussed plan of care with the resident and charge nurse.  Labs/tests ordered:  CBC and BMP  Goals of care:   Long-term care  Durenda Age, NP [facility] and Adult Medicine [phone] (Monday-Friday 8:00 a.m. - 5:00 p.m.) [phone] (after hours)

## 2017-07-05 DIAGNOSIS — I69354 Hemiplegia and hemiparesis following cerebral infarction affecting left non-dominant side: Secondary | ICD-10-CM | POA: Diagnosis not present

## 2017-07-05 DIAGNOSIS — D649 Anemia, unspecified: Secondary | ICD-10-CM | POA: Diagnosis not present

## 2017-07-05 DIAGNOSIS — M25542 Pain in joints of left hand: Secondary | ICD-10-CM | POA: Diagnosis not present

## 2017-07-05 DIAGNOSIS — E119 Type 2 diabetes mellitus without complications: Secondary | ICD-10-CM | POA: Diagnosis not present

## 2017-07-05 DIAGNOSIS — E785 Hyperlipidemia, unspecified: Secondary | ICD-10-CM | POA: Diagnosis not present

## 2017-07-05 LAB — CBC AND DIFFERENTIAL
HCT: 46 (ref 41–53)
Hemoglobin: 14.7 (ref 13.5–17.5)
NEUTROS ABS: 3
PLATELETS: 177 (ref 150–399)
WBC: 7.6

## 2017-07-05 LAB — BASIC METABOLIC PANEL
BUN: 36 — AB (ref 4–21)
Creatinine: 1.2 (ref 0.6–1.3)
Glucose: 89
Potassium: 4.4 (ref 3.4–5.3)
Sodium: 141 (ref 137–147)

## 2017-07-06 DIAGNOSIS — M25542 Pain in joints of left hand: Secondary | ICD-10-CM | POA: Diagnosis not present

## 2017-07-06 DIAGNOSIS — M24542 Contracture, left hand: Secondary | ICD-10-CM | POA: Diagnosis not present

## 2017-07-06 DIAGNOSIS — I69354 Hemiplegia and hemiparesis following cerebral infarction affecting left non-dominant side: Secondary | ICD-10-CM | POA: Diagnosis not present

## 2017-07-09 DIAGNOSIS — M25542 Pain in joints of left hand: Secondary | ICD-10-CM | POA: Diagnosis not present

## 2017-07-09 DIAGNOSIS — I69354 Hemiplegia and hemiparesis following cerebral infarction affecting left non-dominant side: Secondary | ICD-10-CM | POA: Diagnosis not present

## 2017-07-09 DIAGNOSIS — M24542 Contracture, left hand: Secondary | ICD-10-CM | POA: Diagnosis not present

## 2017-07-10 DIAGNOSIS — I69354 Hemiplegia and hemiparesis following cerebral infarction affecting left non-dominant side: Secondary | ICD-10-CM | POA: Diagnosis not present

## 2017-07-10 DIAGNOSIS — M24542 Contracture, left hand: Secondary | ICD-10-CM | POA: Diagnosis not present

## 2017-07-10 DIAGNOSIS — M25542 Pain in joints of left hand: Secondary | ICD-10-CM | POA: Diagnosis not present

## 2017-07-11 DIAGNOSIS — M25542 Pain in joints of left hand: Secondary | ICD-10-CM | POA: Diagnosis not present

## 2017-07-11 DIAGNOSIS — I69354 Hemiplegia and hemiparesis following cerebral infarction affecting left non-dominant side: Secondary | ICD-10-CM | POA: Diagnosis not present

## 2017-07-11 DIAGNOSIS — M24542 Contracture, left hand: Secondary | ICD-10-CM | POA: Diagnosis not present

## 2017-07-12 DIAGNOSIS — M25542 Pain in joints of left hand: Secondary | ICD-10-CM | POA: Diagnosis not present

## 2017-07-12 DIAGNOSIS — I69354 Hemiplegia and hemiparesis following cerebral infarction affecting left non-dominant side: Secondary | ICD-10-CM | POA: Diagnosis not present

## 2017-07-12 DIAGNOSIS — M24542 Contracture, left hand: Secondary | ICD-10-CM | POA: Diagnosis not present

## 2017-07-13 DIAGNOSIS — M25542 Pain in joints of left hand: Secondary | ICD-10-CM | POA: Diagnosis not present

## 2017-07-13 DIAGNOSIS — M24542 Contracture, left hand: Secondary | ICD-10-CM | POA: Diagnosis not present

## 2017-07-13 DIAGNOSIS — I69354 Hemiplegia and hemiparesis following cerebral infarction affecting left non-dominant side: Secondary | ICD-10-CM | POA: Diagnosis not present

## 2017-07-16 DIAGNOSIS — I69354 Hemiplegia and hemiparesis following cerebral infarction affecting left non-dominant side: Secondary | ICD-10-CM | POA: Diagnosis not present

## 2017-07-16 DIAGNOSIS — M24542 Contracture, left hand: Secondary | ICD-10-CM | POA: Diagnosis not present

## 2017-07-16 DIAGNOSIS — M25542 Pain in joints of left hand: Secondary | ICD-10-CM | POA: Diagnosis not present

## 2017-07-17 DIAGNOSIS — I69354 Hemiplegia and hemiparesis following cerebral infarction affecting left non-dominant side: Secondary | ICD-10-CM | POA: Diagnosis not present

## 2017-07-17 DIAGNOSIS — M24542 Contracture, left hand: Secondary | ICD-10-CM | POA: Diagnosis not present

## 2017-07-17 DIAGNOSIS — M25542 Pain in joints of left hand: Secondary | ICD-10-CM | POA: Diagnosis not present

## 2017-07-18 DIAGNOSIS — I69354 Hemiplegia and hemiparesis following cerebral infarction affecting left non-dominant side: Secondary | ICD-10-CM | POA: Diagnosis not present

## 2017-07-18 DIAGNOSIS — M24542 Contracture, left hand: Secondary | ICD-10-CM | POA: Diagnosis not present

## 2017-07-18 DIAGNOSIS — M25542 Pain in joints of left hand: Secondary | ICD-10-CM | POA: Diagnosis not present

## 2017-07-19 DIAGNOSIS — M25542 Pain in joints of left hand: Secondary | ICD-10-CM | POA: Diagnosis not present

## 2017-07-19 DIAGNOSIS — I69354 Hemiplegia and hemiparesis following cerebral infarction affecting left non-dominant side: Secondary | ICD-10-CM | POA: Diagnosis not present

## 2017-07-19 DIAGNOSIS — M24542 Contracture, left hand: Secondary | ICD-10-CM | POA: Diagnosis not present

## 2017-07-20 DIAGNOSIS — M24542 Contracture, left hand: Secondary | ICD-10-CM | POA: Diagnosis not present

## 2017-07-20 DIAGNOSIS — M25542 Pain in joints of left hand: Secondary | ICD-10-CM | POA: Diagnosis not present

## 2017-07-20 DIAGNOSIS — I69354 Hemiplegia and hemiparesis following cerebral infarction affecting left non-dominant side: Secondary | ICD-10-CM | POA: Diagnosis not present

## 2017-07-26 ENCOUNTER — Non-Acute Institutional Stay (SKILLED_NURSING_FACILITY): Payer: Medicare Other | Admitting: Internal Medicine

## 2017-07-26 ENCOUNTER — Encounter: Payer: Self-pay | Admitting: Internal Medicine

## 2017-07-26 DIAGNOSIS — F323 Major depressive disorder, single episode, severe with psychotic features: Secondary | ICD-10-CM

## 2017-07-26 DIAGNOSIS — I69391 Dysphagia following cerebral infarction: Secondary | ICD-10-CM | POA: Diagnosis not present

## 2017-07-26 DIAGNOSIS — I10 Essential (primary) hypertension: Secondary | ICD-10-CM | POA: Diagnosis not present

## 2017-07-26 DIAGNOSIS — E1121 Type 2 diabetes mellitus with diabetic nephropathy: Secondary | ICD-10-CM | POA: Diagnosis not present

## 2017-07-26 NOTE — Assessment & Plan Note (Signed)
Serial A1c's have been prediabetic. Monitor every 4-6 months is appropriate.

## 2017-07-26 NOTE — Assessment & Plan Note (Signed)
Blood pressures are within the recommended range on therapy HCTZ is not option because of history of gout

## 2017-07-26 NOTE — Progress Notes (Signed)
    NURSING HOME LOCATION:  [facility] ROOM NUMBER:  122-A  CODE STATUS:  Full Code  PCP:  [name], MD  [place] [address]  This is a nursing facility follow up for chronic medical diagnoses  Interim medical record and care since last [place] visit was updated with review of diagnostic studies and change in clinical status since last visit were documented.  HPI: His major issue is recurrent falls in the context of hemiparesis from prior CVA. The left hemiplegia ,dysarthria & dysphagia been stable to somewhat progressive. Swallowing test been failed on multiple occasions and PEG is required. Plavix had been discontinued because of GI bleeding which required transfusions. He remains on full dose aspirin prophylaxis.  Other diagnoses include coronary disease, type 2 diabetes with neuropathy, dyslipidemia, essential hypertension and depressive psychosis. Labs are current. He has prerenal azotemia with a BUN of 36 and creatinine 1.2. No anemia is present at this time. Despite the diagnosis of diabetes, his A1c has consistently been prediabetic with the highest value recorded at 6.1%. He receives vitamin D for vitamin D deficiency, most recent level was 26.43. Lipid values are current, repeat would be indicated in May 2019. No TSH is on record.  Review of systems: Could not be completed due to dysarthria. His speech is garbled and unintelligible.  Physical exam:  Pertinent or positive findings:Intermittent dry cough in supine position. Plaque encrusted teeth.Barrel chested.Heart sounds are distant. Breath sounds are also distant. PEG is in place in the left upper quadrant. Left hemiplegia is present. The left hand is in a brace. He has a right-sided weakness as well. Interosseous wasting is present. Pedal pulses are decreased.  General appearance: Adequately nourished; no acute distress, increased work of breathing is present.   Lymphatic: No  lymphadenopathy about the head, neck, axilla. Eyes: No conjunctival inflammation or lid edema is present. There is no scleral icterus. Ears:  External ear exam shows no significant lesions or deformities.   Nose:  External nasal examination shows no deformity or inflammation. Nasal mucosa are pink and moist without lesions, exudates Oral exam:  Lips and gums are healthy appearing.  Neck:  No thyromegaly, masses, tenderness noted.    Heart:  Normal rate and regular rhythm. S1 and S2 normal without gallop, murmur, click, rub .  Lungs: without wheezes, rhonchi,rales , rubs. Abdomen:Bowel sounds are normal. Abdomen is soft and nontender with no organomegaly, hernias,masses. GU: deferred  Extremities:  No cyanosis, clubbing,edema  Skin: Warm & dry w/o tenting. No significant lesions or rash.  See summary under each active problem in the Problem List with associated updated therapeutic plan

## 2017-07-27 ENCOUNTER — Encounter: Payer: Self-pay | Admitting: Internal Medicine

## 2017-07-27 NOTE — Assessment & Plan Note (Signed)
On Seroquel & Depakote Psych follow up assessment indicated

## 2017-07-27 NOTE — Patient Instructions (Signed)
See assessment and plan under each diagnosis in the problem list and acutely for this visit 

## 2017-07-27 NOTE — Assessment & Plan Note (Signed)
It is mandatory to continue PEG to prevent aspiration & PNA

## 2017-09-05 DIAGNOSIS — F39 Unspecified mood [affective] disorder: Secondary | ICD-10-CM | POA: Diagnosis not present

## 2017-09-05 DIAGNOSIS — F419 Anxiety disorder, unspecified: Secondary | ICD-10-CM | POA: Diagnosis not present

## 2017-09-07 LAB — LIPID PANEL
CHOLESTEROL: 105 (ref 0–200)
HDL: 34 — AB (ref 35–70)
LDL Cholesterol: 52
LDL/HDL RATIO: 3.1
TRIGLYCERIDES: 95 (ref 40–160)

## 2017-09-07 LAB — HEMOGLOBIN A1C: Hemoglobin A1C: 6.6

## 2017-09-07 LAB — TSH: TSH: 2.01 (ref 0.41–5.90)

## 2017-09-07 LAB — HEPATIC FUNCTION PANEL
ALT: 17 (ref 10–40)
AST: 26 (ref 14–40)
Bilirubin, Total: 0.4

## 2017-09-07 LAB — VITAMIN B12: Vitamin B-12: 1010

## 2017-09-08 ENCOUNTER — Emergency Department (HOSPITAL_COMMUNITY)
Admission: EM | Admit: 2017-09-08 | Discharge: 2017-09-08 | Disposition: A | Discharge status: Home / Self Care | Payer: Medicare Other | Attending: Emergency Medicine | Admitting: Emergency Medicine

## 2017-09-08 ENCOUNTER — Encounter (HOSPITAL_COMMUNITY): Payer: Self-pay | Admitting: Pharmacy Technician

## 2017-09-08 DIAGNOSIS — I2510 Atherosclerotic heart disease of native coronary artery without angina pectoris: Secondary | ICD-10-CM | POA: Diagnosis not present

## 2017-09-08 DIAGNOSIS — I10 Essential (primary) hypertension: Secondary | ICD-10-CM | POA: Insufficient documentation

## 2017-09-08 DIAGNOSIS — E119 Type 2 diabetes mellitus without complications: Secondary | ICD-10-CM | POA: Diagnosis not present

## 2017-09-08 DIAGNOSIS — K9423 Gastrostomy malfunction: Secondary | ICD-10-CM | POA: Insufficient documentation

## 2017-09-08 DIAGNOSIS — Z79899 Other long term (current) drug therapy: Secondary | ICD-10-CM | POA: Insufficient documentation

## 2017-09-08 DIAGNOSIS — Z7982 Long term (current) use of aspirin: Secondary | ICD-10-CM | POA: Insufficient documentation

## 2017-09-08 DIAGNOSIS — Z87891 Personal history of nicotine dependence: Secondary | ICD-10-CM | POA: Insufficient documentation

## 2017-09-08 MED ORDER — PANCRELIPASE (LIP-PROT-AMYL) 12000-38000 UNITS PO CPEP
24000.0000 [IU] | ORAL_CAPSULE | Freq: Once | ORAL | Status: AC
  Administered 2017-09-08: 24000 [IU] via ORAL
  Filled 2017-09-08: qty 2

## 2017-09-08 MED ORDER — SODIUM BICARBONATE 650 MG PO TABS
650.0000 mg | ORAL_TABLET | Freq: Once | ORAL | Status: AC
  Administered 2017-09-08: 650 mg via ORAL
  Filled 2017-09-08: qty 1

## 2017-09-08 NOTE — Discharge Instructions (Signed)
It was our pleasure to provide your ER care today.  Return to ER if worse, new symptoms, fevers, other concern.

## 2017-09-08 NOTE — ED Triage Notes (Signed)
Pt arrives via EMS from [place] with a clogged feeding tube. Nurse was attempting to feed pt and was unable to flush tube. 132/80, HR 88, RR 22, 97% RA.

## 2017-09-08 NOTE — ED Provider Notes (Addendum)
[facility] EMERGENCY DEPARTMENT Provider Note   CSN: [id number] Arrival date & time: 09/08/17  1200     History   Chief Complaint No chief complaint on file.   HPI Sean Collins is a 67 y.o. male.  Patient presents from [facility] via EMS with clogged feeding tube. Pt is awake and alert. Denies specific c/o - pt very limited historian - prior cva - level 5 caveat. No report of fevers. No abd pain. No vomiting.   The history is provided by the patient and the EMS personnel. The history is limited by the condition of the patient.    Past Medical History:  Diagnosis Date  . Diabetes mellitus without complication ([place])   . Dysarthria   . Dysphagia   . GI bleed   . Hyperlipidemia   . Hypertension   . Renal insufficiency 09/22/2016  . Stroke [facility])     Patient Active Problem List   Diagnosis Date Noted  . Renal insufficiency 09/22/2016  . Type 2 diabetes mellitus with diabetic nephropathy, without long-term current use of insulin ([place]) 09/13/2016  . 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 Cervical myofascial pain syndrome 08/29/2016  . Cervical facet joint syndrome 08/29/2016  . Recurrent falls 07/11/2016  . Major depressive disorder, recurrent episode ([place]) 04/17/2016  . Shoulder pain, left 02/07/2016  . Essential hypertension 12/08/2015  . Late effects of CVA (cerebrovascular accident) 12/08/2015  . Cerebrovascular accident (CVA) due to thrombosis of right middle cerebral artery ([place])   . Slurred speech   . UGIB (upper gastrointestinal bleed)   . Malnutrition of moderate degree 11/04/2015  . Hematemesis without nausea   . Left hemiparesis ([place])   . Dysphagia   . Dysphagia due to recent stroke   . Hyperlipidemia 11/16/2011  . Gout 11/16/2011  . History of stroke 11/16/2011  . CAD (coronary artery disease) 11/16/2011    Past Surgical History:  Procedure Laterality Date  . ESOPHAGOGASTRODUODENOSCOPY (EGD) WITH PROPOFOL N/A 11/03/2015   Procedure: ESOPHAGOGASTRODUODENOSCOPY (EGD) WITH PROPOFOL;  Surgeon: Manus Gunning, MD;  Location: [facility];  Service: Gastroenterology;  Laterality: N/A;  . IR REPLC GASTRO/COLONIC TUBE PERCUT W/FLUORO  12/29/2016        Home Medications    Prior to Admission medications   Medication Sig Start Date End Date Taking? Authorizing Provider  acetaminophen (TYLENOL) 650 MG CR tablet 650 mg every 6 (six) hours as needed for pain or fever.     [provider]  allopurinol (ZYLOPRIM) 150 mg TABS tablet Place 150 mg into feeding tube daily.    [provider]  amLODipine (NORVASC) 10 MG tablet Place 10 mg into feeding tube daily.    [provider]  aspirin 325 MG tablet Place 325 mg into feeding tube daily.    [provider]  atenolol (TENORMIN) 50 MG tablet Place 50 mg into feeding tube 2 (two) times daily.    [provider]  atorvastatin (LIPITOR) 80 MG tablet Place 80 mg into feeding tube daily.     [provider]  carboxymethylcellulose (REFRESH PLUS) 0.5 % SOLN Place 1 drop into both eyes 3 (three) times daily as needed (dry eyes).    [provider]  cholecalciferol (VITAMIN D) 1000 UNITS tablet 1,000 Units daily. Give via PEG    [provider]  divalproex (DEPAKOTE) 125 MG DR tablet Take 125 mg by mouth 2 (two) times daily.    [provider]  enalapril (VASOTEC) 20 MG tablet Take  1 tablet (20 mg total) by mouth daily. 11/18/11   [name], MD  folic acid (FOLVITE) 1 MG tablet Place 1 mg into feeding tube daily.     [provider]  Menthol, Topical Analgesic, (BIOFREEZE) 4 % GEL Apply 1 application topically 2 (two) times daily. Apply to left hand QA and QPM for pain    [provider]  Nutritional Supplements (ISOSOURCE 1.5 CAL) LIQD Give 2 cans three times daily bolus via PEG tub with 30 ml flushes before and after feeding and medications. This will provide 2250 Kcal per day.     [provider]  QUEtiapine (SEROQUEL) 25 MG tablet Place 25 mg into feeding tube 2 (two) times daily. 1200 and 1800     [provider]  ranitidine (ZANTAC) 15 MG/ML syrup Place into feeding tube 2 (two) times daily. Take 5 mL q12h    [provider]    Family History Family History  Problem Relation Age of Onset  . Stroke Brother   . Diabetes Brother   . Stroke Brother   . Stroke Maternal Aunt     Social History Social History   Tobacco Use  . Smoking status: Former Smoker    Packs/day: 0.50    Years: 32.00    Pack years: 16.00    Types: Cigarettes  . Smokeless tobacco: Never Used  . Tobacco comment: Started at age 28 though quit for 10 years at one point  Substance Use Topics  . Alcohol use: No    Alcohol/week: 0.0 oz  . Drug use: No     Allergies   Patient has no known allergies.   Review of Systems Review of Systems  Unable to perform ROS: Other  Constitutional: Negative for fever.  Gastrointestinal: Negative for vomiting.  level 5 caveat - poorly responsive to questions/prior cva.    Physical Exam Updated Vital Signs There were no vitals taken for this visit.  Physical Exam  Constitutional: He appears well-developed and well-nourished. No distress.  HENT:  Head: Atraumatic.  Eyes: Conjunctivae are normal.  Neck: Neck supple. No tracheal deviation present.  Cardiovascular: Normal rate, regular rhythm, normal heart sounds and intact distal pulses.  Pulmonary/Chest: Effort normal and breath sounds normal. No accessory muscle usage. No respiratory distress.  Abdominal: Soft. Bowel sounds are normal. He exhibits no distension. There is no tenderness.  Feeding tube site clean, without sign of infection.   Musculoskeletal: He exhibits no edema.  Neurological: He is alert.  Awake/alert, content appearing.   Skin: Skin is warm and dry. He is not diaphoretic.  Psychiatric: He has a normal mood and affect.  Nursing note and  vitals reviewed.    ED Treatments / Results  Labs (all labs ordered are listed, but only abnormal results are displayed) Labs Reviewed - No data to display  EKG None  Radiology No results found.  Procedures Procedures (including critical care time)  Medications Ordered in ED Medications  lipase/protease/amylase (CREON) capsule 24,000 Units (has no administration in time range)    And  sodium bicarbonate tablet 650 mg (has no administration in time range)     Initial Impression / Assessment and Plan / ED Course  I have reviewed the triage vital signs and the nursing notes.  Pertinent labs & imaging results that were available during my care of the patient were reviewed by me and considered in my medical decision making (see chart for details).  Nursing to do clogged feeding tube protocol.  abd soft nt. No fevers or other acute c/o.  Reviewed nursing notes and prior charts for additional history.   Nursing reports unable to get tube unclogged.   Tube has been in for 2 years/tract well established.  Old g tube removed. New 20 fr tube placed easily without complication. Gastric contents return through new tube, easily flushed w water, pt tolerates. abd soft nt.  Patient appears stable for d/c.    Final Clinical Impressions(s) / ED Diagnoses   Final diagnoses:  None    ED Discharge Orders    None       Lajean Saver, MD 09/08/17 1240    Lajean Saver, MD 09/08/17 [phone]

## 2017-09-08 NOTE — ED Notes (Signed)
[facility] contacted for tx to [facility]

## 2017-09-08 NOTE — ED Notes (Signed)
Attempted report X1 no answer

## 2017-09-08 NOTE — ED Notes (Signed)
ED Provider at bedside. 

## 2017-10-05 LAB — HM DIABETES EYE EXAM

## 2017-10-09 IMAGING — CT CT HEAD W/O CM
3 series · 14 of 47 positions shown, 16 images · non-contrast
Comparison: MRI brain 01/23/2015; CT brain 01/22/2015.

CLINICAL DATA: Patient with sudden onset slurred speech and
dizziness. Code stroke.

EXAM:
CT HEAD WITHOUT CONTRAST
TECHNIQUE: Contiguous axial images were obtained from the base of the skull
through the vertex without intravenous contrast.

[Series 2: head 5.0 h30s · axial · 0.48mm/px · z∈[-86,+39]mm · 8 of 31 slices shown, 10 images]
[im 3/31  brain]
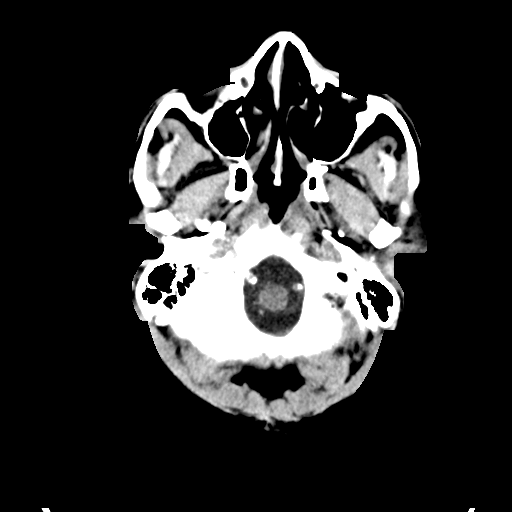
[im 3/31  bone]
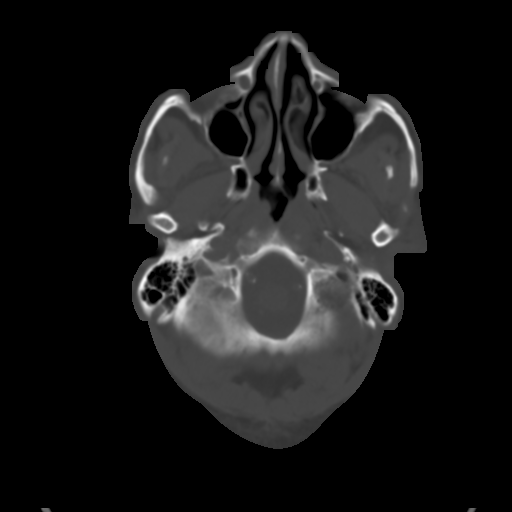
[im 7/31  brain]
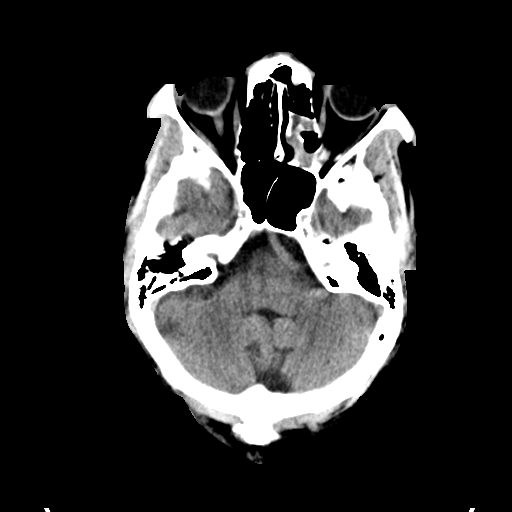
[im 10/31  brain]
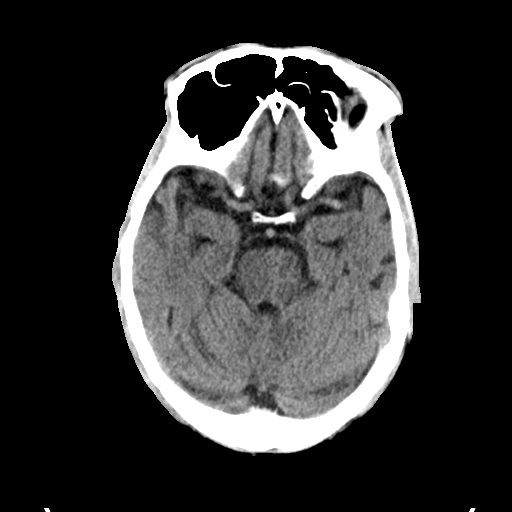
[im 14/31  brain]
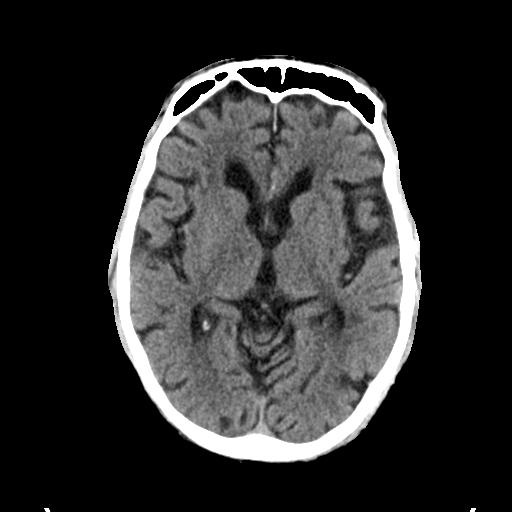
[im 17/31  brain]
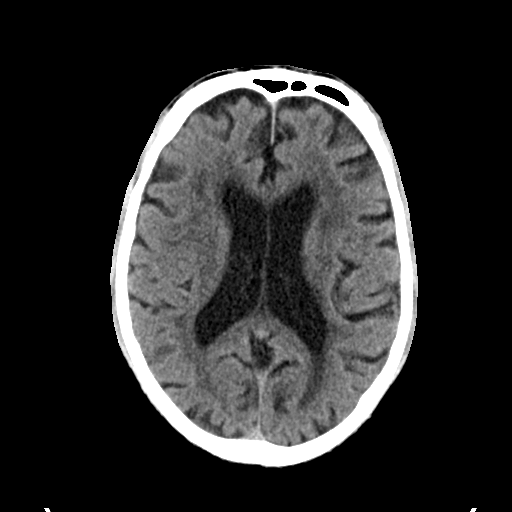
[im 17/31  bone]
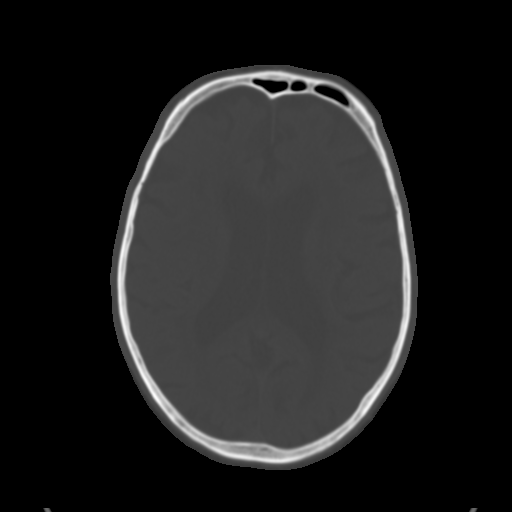
[im 21/31  brain]
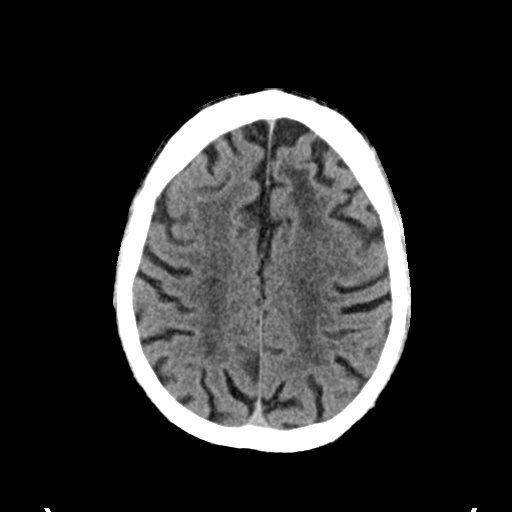
[im 24/31  brain]
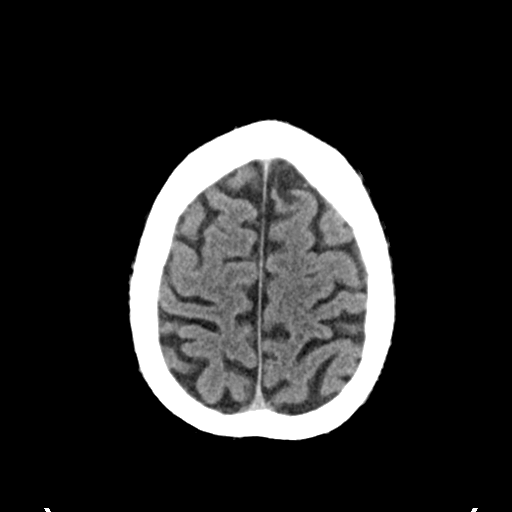
[im 28/31  brain]
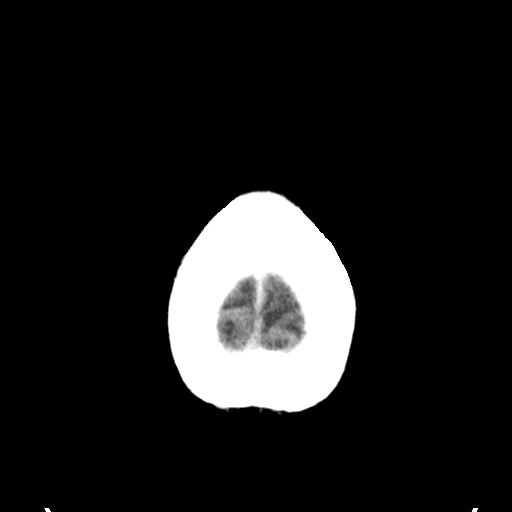

[Series 4: head 3.0 mpr · coronal · 0.30mm/px · 3 of 67 slices shown (1 of 2)]
[im 23/67  brain]
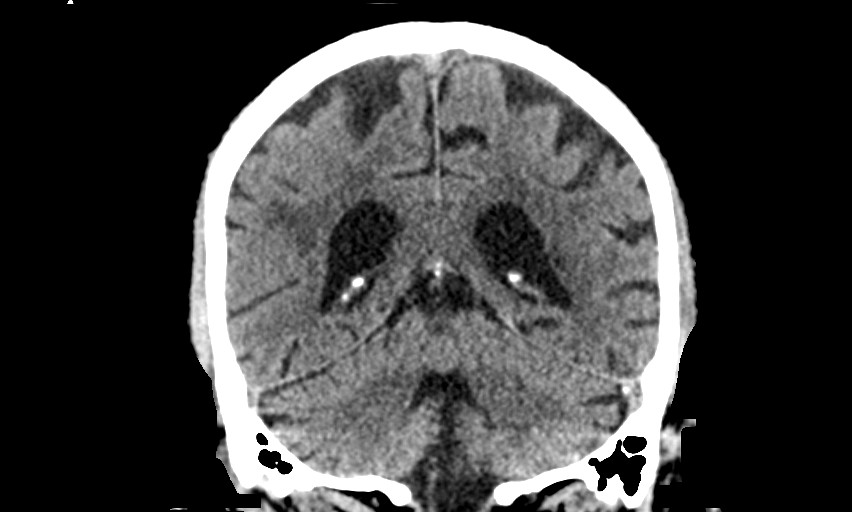
[im 30/67  brain]
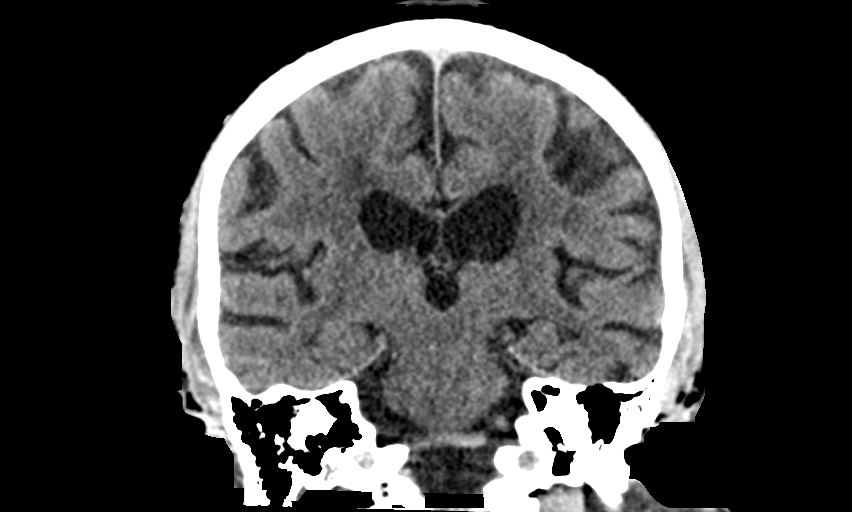
[im 37/67  brain]
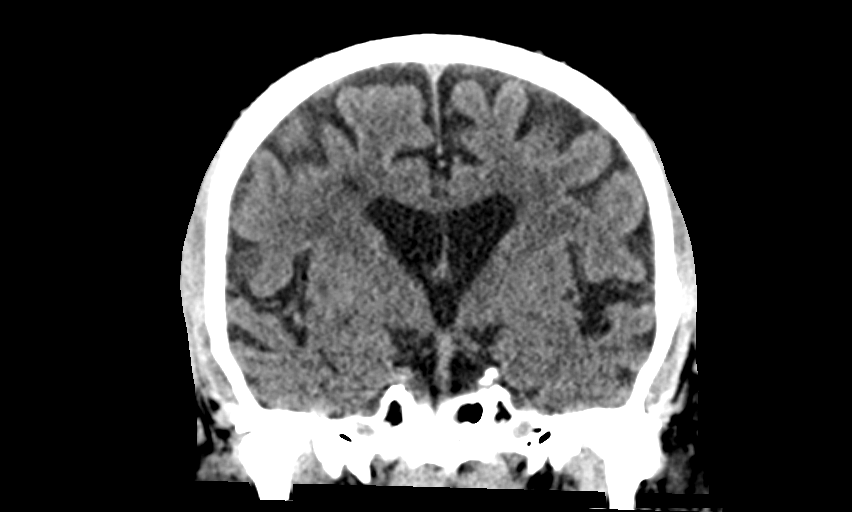

[Series 5: head 3.0 mpr · sagittal · 0.35mm/px · 3 of 55 slices shown (2 of 2)]
[im 19/55  brain]
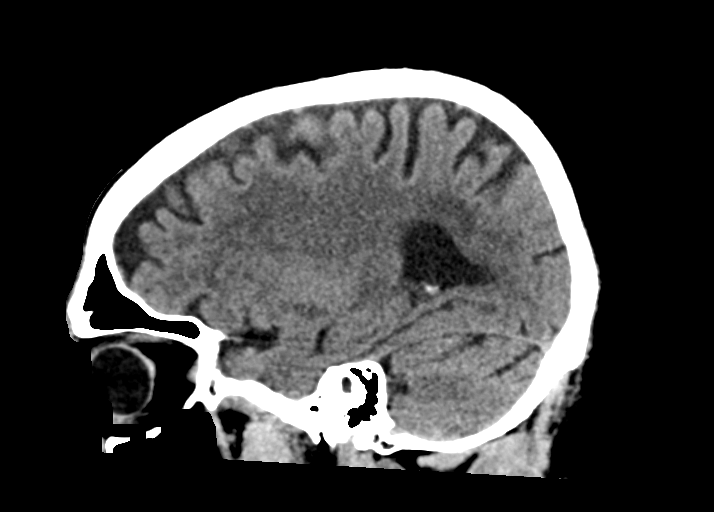
[im 28/55  brain]
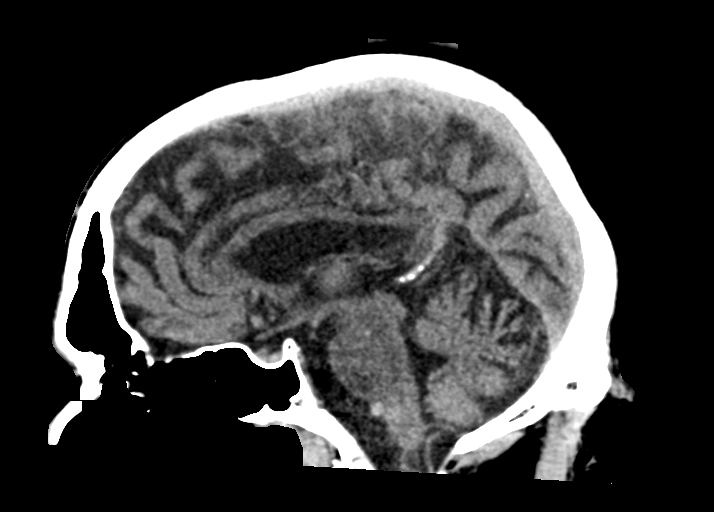
[im 37/55  brain]
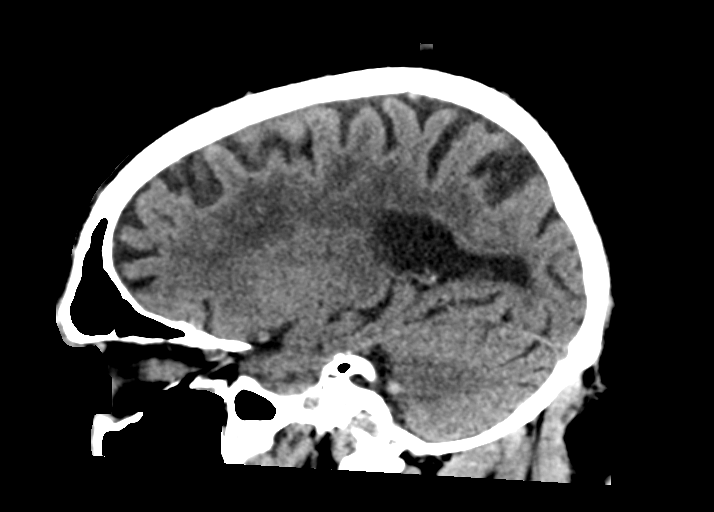

[14 of 47 positions shown; findings below may reference images not displayed]

FINDINGS: Ventricles and sulci are prominent for patient's age. Old left
frontal lobe encephalomalacia. Periventricular and subcortical white
matter hypodensity compatible with chronic microvascular ischemic
changes. No evidence for large territory infarct, intracranial
hemorrhage, mass lesion or mass-effect. Orbits are unremarkable.
Ethmoid air cell mucosal thickening. Mastoid air cells unremarkable.
Calvarium is intact.
IMPRESSION: No acute intracranial process.

Chronic microvascular ischemic changes and cortical atrophy.

Critical Value/emergent results were called by telephone at the time
of interpretation on 10/23/2015 at [DATE] to Dr. Lucia, who
verbally acknowledged these results.

## 2017-10-11 ENCOUNTER — Non-Acute Institutional Stay (SKILLED_NURSING_FACILITY): Payer: Medicare Other | Admitting: Internal Medicine

## 2017-10-11 ENCOUNTER — Encounter: Payer: Self-pay | Admitting: Internal Medicine

## 2017-10-11 DIAGNOSIS — R1310 Dysphagia, unspecified: Secondary | ICD-10-CM | POA: Diagnosis not present

## 2017-10-11 DIAGNOSIS — E1121 Type 2 diabetes mellitus with diabetic nephropathy: Secondary | ICD-10-CM | POA: Diagnosis not present

## 2017-10-11 DIAGNOSIS — I2510 Atherosclerotic heart disease of native coronary artery without angina pectoris: Secondary | ICD-10-CM | POA: Diagnosis not present

## 2017-10-11 NOTE — Assessment & Plan Note (Addendum)
Assess response to long acting NTG

## 2017-10-11 NOTE — Assessment & Plan Note (Signed)
A1c of 6.6% indicates excellent control

## 2017-10-11 NOTE — Progress Notes (Signed)
    NURSING HOME LOCATION:  [facility] ROOM NUMBER:  [id number]  CODE STATUS:  Full Code  PCP:  [name], MD  [place] [address]  This is a nursing facility follow up of chronic medical diagnoses   Interim medical record and care since last [place] visit was updated with review of diagnostic studies and change in clinical status since last visit were documented.  HPI: The patient is a permanent resident of the facility with diagnoses of coronary artery disease, RMCA stroke complicated by left hemiparesis and dysphagia, essential hypertension, diabetes with nephropathy, history of GI bleed and depressive psychosis. A1c is current and reveals well controlled diabetes with a value of 6.6%. Renal function has been stable with a creatinine of 1.2. Hemoglobin and hematocrit were normal despite the history of GI bleed.  Review of systems: Was severely limited due to his nonverbal state. Communication was via shaking of the head or pointing with his right hand. Initially he seemed to indicate he was having left knee pain but then subsequently denied this with a negative shake of the head. He consistently seemed to attempt to communicate that he was having chest pain. As noted he has a history of coronary artery disease and is on 325 mg of aspirin daily and high-dose atorvastatin. Apparently he had coronary artery stenting most recently in 2006 Has severe dysphasia necessitating PEG tube. Staff reports fluid volumes required for medication administration into the PEG seem to cause epigastric discomfort & cab be associated with cough.  Physical exam:  Pertinent or positive findings: He sits in the wheelchair with obvious L hemiparesis. Suboptimally nourished clinically.  Facies are blank in character. Mouth is agape with drooling. He places a  towel in his mouth to dam the salivation. He has severe coating of the teeth. Breath sounds are decreased but chest is clear  to auscultation. Heart sounds are distant and irregular. He exhibits weakness in the right lower extremity as well. He has flexion of the fourth and fifth fingers of the right hand. The fingers of left hand exhibit contractures post stroke. Pedal pulses are decreased.  General appearance:  no acute distress, increased work of breathing is present.   Lymphatic: No lymphadenopathy about the head, neck, axilla. Eyes: No conjunctival inflammation or lid edema is present. There is no scleral icterus. Ears:  External ear exam shows no significant lesions or deformities.   Nose:  External nasal examination shows no deformity or inflammation. Nasal mucosa are dry without lesions, exudates Oral exam:  Lips and gums are healthy appearing.  Neck:  No thyromegaly, masses, tenderness noted.    Heart:   without gallop, murmur, click, rub .  Lungs: without wheezes, rhonchi, rales, rubs. Abdomen: Bowel sounds are normal. Abdomen is soft and nontender with no organomegaly, hernias, masses. GU: Deferred  Extremities:  No cyanosis, clubbing, edema  Skin: Warm & dry w/o tenting. No significant lesions or rash.  See summary under each active problem in the Problem List with associated updated therapeutic plan

## 2017-10-11 NOTE — Patient Instructions (Signed)
See assessment and plan under each diagnosis in the problem list and acutely for this visit 

## 2017-10-11 NOTE — Assessment & Plan Note (Addendum)
See 10/11/17 angina vs reflux  Staff questions epigastric discomfort related to increased volumes through the PEG tube Trial of Im Dur if can be given through PEG If not; NTG patch 0.2 12 hours on & 12 hours off to prevent tolerance

## 2017-10-25 LAB — LIPID PANEL
Cholesterol: 96 (ref 0–200)
HDL: 29 — AB (ref 35–70)
LDL Cholesterol: 50
LDL/HDL RATIO: 3.4
TRIGLYCERIDES: 86 (ref 40–160)

## 2017-10-25 LAB — CBC AND DIFFERENTIAL
HCT: 42 (ref 41–53)
HEMOGLOBIN: 13.3 — AB (ref 13.5–17.5)
Neutrophils Absolute: 4
Platelets: 146 — AB (ref 150–399)
WBC: 7.3

## 2017-10-25 LAB — BASIC METABOLIC PANEL
BUN: 43 — AB (ref 4–21)
CREATININE: 1.4 — AB (ref 0.6–1.3)
Glucose: 95
POTASSIUM: 5.7 — AB (ref 3.4–5.3)
Sodium: 140 (ref 137–147)

## 2017-10-25 LAB — VITAMIN B12: VITAMIN B 12: 1032

## 2017-10-25 LAB — HEPATIC FUNCTION PANEL
ALK PHOS: 142 — AB (ref 25–125)
ALT: 37 (ref 10–40)
AST: 45 — AB (ref 14–40)
BILIRUBIN, TOTAL: 0.3

## 2017-10-25 LAB — HEMOGLOBIN A1C: Hemoglobin A1C: 6.6

## 2017-10-25 LAB — TSH: TSH: 2.56 (ref 0.41–5.90)

## 2017-11-02 LAB — HM DIABETES FOOT EXAM

## 2017-11-05 LAB — BASIC METABOLIC PANEL: POTASSIUM: 4.5 (ref 3.4–5.3)

## 2017-11-19 ENCOUNTER — Other Ambulatory Visit: Payer: Self-pay

## 2017-11-19 NOTE — Patient Outreach (Signed)
[facility] Care Management  11/19/2017  Alfred Harrel Gellatly Nov 29, 1950 [id number]   Medication Adherence call to Mr. Teshawn Moan left a message for patient to call back patient is due on Atorvastatin 80 mg and Enalepril 20 mg .Mr. Zane is showing past due under [place].   [facility] Management Direct Dial [phone]  Fax [phone] [email or web address]

## 2017-12-17 ENCOUNTER — Other Ambulatory Visit: Payer: Self-pay

## 2017-12-17 NOTE — Patient Outreach (Signed)
[facility]) Care Management  12/17/2017  Griffey Nicasio Reckart 1951-05-06 [id number]   Medication Adherence call to Mr. Yehonatan Grandison call patient but his voice mail is not set up to leave message patient is due on Enalapril 20 mg. Mr. Wohl is showing past due under [place].  [facility] Management Direct Dial [phone]  Fax [phone] [email or web address]

## 2018-01-03 ENCOUNTER — Non-Acute Institutional Stay (SKILLED_NURSING_FACILITY): Payer: Medicare Other

## 2018-01-03 DIAGNOSIS — Z0000 Encounter for general adult medical examination without abnormal findings: Secondary | ICD-10-CM | POA: Diagnosis not present

## 2018-01-03 NOTE — Progress Notes (Signed)
Subjective:   Sean Collins is a 67 y.o. male who presents for Medicare Annual/Subsequent preventive examination; incapacitated patient unable to answer questions appropriately   Last AWV-01/02/2017    Objective:    Vitals: BP 125/72 (BP Location: Left Arm, Patient Position: Supine)   Pulse 66   Temp 97.9 F (36.6 C) (Oral)   Ht 5\' 9"  (1.753 m)   Wt 174 lb (78.9 kg)   BMI 25.70 kg/m   Body mass index is 25.7 kg/m.  Advanced Directives 01/03/2018 03/09/2017 01/15/2017 01/02/2017 12/01/2016 11/10/2016 10/13/2016  Does Patient Have a Medical Advance Directive? Yes No No Yes Yes Yes No  Type of Advance Directive Healthcare Power of [place] of facility DNR (pink MOST or yellow form) -  Does patient want to make changes to medical advance directive? No - Patient declined - - No - Patient declined - No - Patient declined -  Copy of [place] in Chart? Yes - - Yes - - -  Would patient like information on creating a medical advance directive? - - - - - - -    Tobacco Social History   Tobacco Use  Smoking Status Former Smoker  . Packs/day: 0.50  . Years: 32.00  . Pack years: 16.00  . Types: Cigarettes  Smokeless Tobacco Never Used  Tobacco Comment   Started at age 47 though quit for 10 years at one point     Counseling given: Not Answered Comment: Started at age 40 though quit for 10 years at one point   Clinical Intake:  Pre-visit preparation completed: No  Pain : No/denies pain     Nutritional Risks: None Diabetes: Yes CBG done?: No Did pt. bring in CBG monitor from home?: No  How often do you need to have someone help you when you read instructions, pamphlets, or other written materials from your doctor or pharmacy?: 3 - Sometimes  Interpreter Needed?: No  Information entered by :: Tyson Dense, RN  Past Medical History:  Diagnosis Date  . Diabetes mellitus without complication ([place])   . Dysarthria    . Dysphagia   . GI bleed   . Hyperlipidemia   . Hypertension   . Renal insufficiency 09/22/2016  . Stroke [facility])    Past Surgical History:  Procedure Laterality Date  . ESOPHAGOGASTRODUODENOSCOPY (EGD) WITH PROPOFOL N/A 11/03/2015   Procedure: ESOPHAGOGASTRODUODENOSCOPY (EGD) WITH PROPOFOL;  Surgeon: Manus Gunning, MD;  Location: [facility];  Service: Gastroenterology;  Laterality: N/A;  . IR REPLC GASTRO/COLONIC TUBE PERCUT W/FLUORO  12/29/2016   Family History  Problem Relation Age of Onset  . Stroke Brother   . Diabetes Brother   . Stroke Brother   . Stroke Maternal Aunt    Social History   Socioeconomic History  . Marital status: Legally Separated    Spouse name: Not on file  . Number of children: Not on file  . Years of education: Not on file  . Highest education level: Not on file  Occupational History  . Not on file  Social Needs  . Financial resource strain: Not hard at all  . Food insecurity:    Worry: Never true    Inability: Never true  . Transportation needs:    Medical: No    Non-medical: No  Tobacco Use  . Smoking status: Former Smoker    Packs/day: 0.50    Years: 32.00    Pack years: 16.00  Types: Cigarettes  . Smokeless tobacco: Never Used  . Tobacco comment: Started at age 19 though quit for 10 years at one point  Substance and Sexual Activity  . Alcohol use: No    Alcohol/week: 0.0 standard drinks  . Drug use: No  . Sexual activity: Not Currently  Lifestyle  . Physical activity:    Days per week: 0 days    Minutes per session: 0 min  . Stress: To some extent  Relationships  . Social connections:    Talks on phone: Not on file    Gets together: Not on file    Attends religious service: Not on file    Active member of club or organization: Not on file    Attends meetings of clubs or organizations: Not on file    Relationship status: Separated  Other Topics Concern  . 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 acetaminophen (TYLENOL) 650 MG CR tablet 650 mg every 6 (six) hours as needed for pain or fever.   Marland Kitchen allopurinol (ZYLOPRIM) 150 mg TABS tablet Place 150 mg into feeding tube daily.  Marland Kitchen amLODipine (NORVASC) 10 MG tablet Place 10 mg into feeding tube daily.  Marland Kitchen aspirin 325 MG tablet Place 325 mg into feeding tube daily.  Marland Kitchen atenolol (TENORMIN) 50 MG tablet Place 50 mg into feeding tube 2 (two) times daily.  Marland Kitchen atorvastatin (LIPITOR) 80 MG tablet Place 80 mg into feeding tube daily.   . Carboxymeth-Glycerin-Polysorb (REFRESH OPTIVE ADVANCED OP) Apply 1 drop to eye 4 (four) times daily.  . carboxymethylcellulose (REFRESH PLUS) 0.5 % SOLN Place 1 drop into both eyes 3 (three) times daily as needed (dry eyes).  . cholecalciferol (VITAMIN D) 1000 UNITS tablet 1,000 Units daily. Give via PEG  . divalproex (DEPAKOTE) 125 MG DR tablet 125 mg 2 (two) times daily. Give via tube  . enalapril (VASOTEC) 20 MG tablet Take 1 tablet (20 mg total) by mouth daily.  . folic acid (FOLVITE) 1 MG tablet Place 1 mg into feeding tube daily.   . Menthol, Topical Analgesic, (BIOFREEZE) 4 % GEL Apply 1 application topically 2 (two) times daily. Apply to left hand QA and QPM for pain  . Nutritional Supplements (ISOSOURCE 1.5 CAL) LIQD Give 2 cans three times daily bolus via PEG tub with 30 ml flushes before and after feeding and medications. This will provide 2250 Kcal per day.  Marland Kitchen QUEtiapine (SEROQUEL) 25 MG tablet Place 25 mg into feeding tube 2 (two) times daily. 1200 and 1800   . ranitidine (ZANTAC) 15 MG/ML syrup Place into feeding tube 2 (two) times daily. Take 5 mL q12h   No facility-administered encounter medications on file as of 01/03/2018.     Activities of Daily Living In your present state of health, do you have any  difficulty performing the following activities: 01/03/2018  Hearing? N  Vision? N  Difficulty concentrating or making decisions? Y  Walking or climbing stairs? Y  Dressing or bathing? Y  Doing errands, shopping? Y  Preparing Food and eating ? Y  Using the Toilet? Y  In the past six months, have you accidently leaked urine? Y  Do you have problems with loss of bowel control? Y  Managing your Medications?  Y  Managing your Finances? Y  Housekeeping or managing your Housekeeping? Y  Some recent data might be hidden    Patient Care Team: [name], MD as PCP - General (Internal Medicine) Roy as Referring Physician (General Practice) Letta Pate, Luanna Salk, MD as Consulting Physician (Physical Medicine and Rehabilitation) Rosalin Hawking, MD as Consulting Physician (Neurology)   Assessment:   This is a routine wellness examination for Sean Collins.  Exercise Activities and Dietary recommendations Current Exercise Habits: The patient does not participate in regular exercise at present, Exercise limited by: orthopedic condition(s)  Goals   None     Fall Risk Fall Risk  01/03/2018 03/09/2017 01/02/2017 11/15/2016 10/13/2016  Falls in the past year? Yes Exclusion - non ambulatory Yes No Yes  Comment - - - - -  Number falls in past yr: 1 - 2 or more - 2 or more  Comment - - - - -  Injury with Fall? Yes - No - No  Risk Factor Category  - - - - High Fall Risk  Risk for fall due to : - - - - -  Follow up - - - - -   Is the patient's home free of loose throw rugs in walkways, pet beds, electrical cords, etc?   yes      Grab bars in the bathroom? yes      Handrails on the stairs?   yes      Adequate lighting?   yes  Timed Get Up and Go Performed: patient is unambulatory  Depression Screen PHQ 2/9 Scores 01/03/2018 01/02/2017 02/07/2016  PHQ - 2 Score 0 - 0  PHQ- 9 Score - - 3  Exception Documentation - Medical reason -    Cognitive Function MMSE - Mini Mental State Exam  01/03/2018 01/02/2017  Not completed: Unable to complete Unable to complete        Immunization History  Administered Date(s) Administered  . Influenza-Unspecified 06/21/2015  . PPD Test 11/08/2015  . Pneumococcal Conjugate-13 09/06/2017  . Pneumococcal-Unspecified 06/21/2015    Qualifies for Shingles Vaccine? Not in past records  Screening Tests Health Maintenance  Topic Date Due  . INFLUENZA VACCINE  03/06/2018 (Originally 12/06/2017)  . COLONOSCOPY  05/08/2018 (Originally 10/15/2000)  . Hepatitis C Screening  12/06/2023 (Originally 03/07/1951)  . TETANUS/TDAP  01/10/2027 (Originally 10/15/1969)  . HEMOGLOBIN A1C  04/26/2018  . OPHTHALMOLOGY EXAM  10/06/2018  . FOOT EXAM  11/03/2018  . PNA vac Low Risk Adult (2 of 2 - PPSV23) 06/20/2020   Cancer Screenings: Lung: Low Dose CT Chest recommended if Age 53-80 years, 30 pack-year currently smoking OR have quit w/in 15years. Patient does not qualify. Colorectal: excluded due to physical state  Additional Screenings:  Hepatitis C Screening:  TDAP due: ordered Flu vaccine due: will receive at [facility]:    I have personally reviewed and addressed the Medicare Annual Wellness questionnaire and have noted the following in the patient's chart:  A. Medical and social history B. Use of alcohol, tobacco or illicit drugs  C. Current medications and supplements D. Functional ability and status E.  Nutritional status F.  Physical activity G. Advance directives H. List of other physicians I.  Hospitalizations, surgeries, and ER visits in previous 12 months J.  [place] to include hearing, vision, cognitive, depression L. Referrals and appointments - none  In addition, I am unable to review and discuss with incapacitated patient certain preventive protocols, quality metrics, and best  practice recommendations. A written personalized care plan for preventive services as well as general preventive health recommendations  were provided to patient.   See attached scanned questionnaire for additional information.   Signed,   Tyson Dense, RN Nurse Health Advisor  Patient Concerns: None

## 2018-01-03 NOTE — Patient Instructions (Signed)
[name] , Thank you for taking time to come for your Medicare Wellness Visit. I appreciate your ongoing commitment to your health goals. Please review the following plan we discussed and let me know if I can assist you in the future.   Screening recommendations/referrals: Colonoscopy excluded Recommended yearly ophthalmology/optometry visit for glaucoma screening and checkup Recommended yearly dental visit for hygiene and checkup  Vaccinations: Influenza vaccine due, will get at [facility] Pneumococcal vaccine up to date Tdap vaccine due, ordered Shingles vaccine not in past records    Advanced directives: in chart  Conditions/risks identified: none  Next appointment: Dr. Linna Darner makes rounds  Preventive Care 40 Years and Older, Male Preventive care refers to lifestyle choices and visits with your health care provider that can promote health and wellness. What does preventive care include?  A yearly physical exam. This is also called an annual well check.  Dental exams once or twice a year.  Routine eye exams. Ask your health care provider how often you should have your eyes checked.  Personal lifestyle choices, including:  Daily care of your teeth and gums.  Regular physical activity.  Eating a healthy diet.  Avoiding tobacco and drug use.  Limiting alcohol use.  Practicing safe sex.  Taking low doses of aspirin every day.  Taking vitamin and mineral supplements as recommended by your health care provider. What happens during an annual well check? The services and screenings done by your health care provider during your annual well check will depend on your age, overall health, lifestyle risk factors, and family history of disease. Counseling  Your health care provider may ask you questions about your:  Alcohol use.  Tobacco use.  Drug use.  Emotional well-being.  Home and relationship well-being.  Sexual activity.  Eating habits.  History of  falls.  Memory and ability to understand (cognition).  Work and work Statistician. Screening  You may have the following tests or measurements:  Height, weight, and BMI.  Blood pressure.  Lipid and cholesterol levels. These may be checked every 5 years, or more frequently if you are over 25 years old.  Skin check.  Lung cancer screening. You may have this screening every year starting at age 95 if you have a 30-pack-year history of smoking and currently smoke or have quit within the past 15 years.  Fecal occult blood test (FOBT) of the stool. You may have this test every year starting at age 67.  Flexible sigmoidoscopy or colonoscopy. You may have a sigmoidoscopy every 5 years or a colonoscopy every 10 years starting at age 30.  Prostate cancer screening. Recommendations will vary depending on your family history and other risks.  Hepatitis C blood test.  Hepatitis B blood test.  Sexually transmitted disease (STD) testing.  Diabetes screening. This is done by checking your blood sugar (glucose) after you have not eaten for a while (fasting). You may have this done every 1-3 years.  Abdominal aortic aneurysm (AAA) screening. You may need this if you are a current or former smoker.  Osteoporosis. You may be screened starting at age 68 if you are at high risk. Talk with your health care provider about your test results, treatment options, and if necessary, the need for more tests. Vaccines  Your health care provider may recommend certain vaccines, such as:  Influenza vaccine. This is recommended every year.  Tetanus, diphtheria, and acellular pertussis (Tdap, Td) vaccine. You may need a Td booster every 10 years.  Zoster vaccine. You  may need this after age 58.  Pneumococcal 13-valent conjugate (PCV13) vaccine. One dose is recommended after age 65.  Pneumococcal polysaccharide (PPSV23) vaccine. One dose is recommended after age 65. Talk to your health care provider about  which screenings and vaccines you need and how often you need them. This information is not intended to replace advice given to you by your health care provider. Make sure you discuss any questions you have with your health care provider. Document Released: 05/21/2015 Document Revised: 01/12/2016 Document Reviewed: 02/23/2015 Elsevier Interactive Patient Education  2017 [place] Prevention in the Home Falls can cause injuries. They can happen to people of all ages. There are many things you can do to make your home safe and to help prevent falls. What can I do on the outside of my home?  Regularly fix the edges of walkways and driveways and fix any cracks.  Remove anything that might make you trip as you walk through a door, such as a raised step or threshold.  Trim any bushes or trees on the path to your home.  Use bright outdoor lighting.  Clear any walking paths of anything that might make someone trip, such as rocks or tools.  Regularly check to see if handrails are loose or broken. Make sure that both sides of any steps have handrails.  Any raised decks and porches should have guardrails on the edges.  Have any leaves, snow, or ice cleared regularly.  Use sand or salt on walking paths during winter.  Clean up any spills in your garage right away. This includes oil or grease spills. What can I do in the bathroom?  Use night lights.  Install grab bars by the toilet and in the tub and shower. Do not use towel bars as grab bars.  Use non-skid mats or decals in the tub or shower.  If you need to sit down in the shower, use a plastic, non-slip stool.  Keep the floor dry. Clean up any water that spills on the floor as soon as it happens.  Remove soap buildup in the tub or shower regularly.  Attach bath mats securely with double-sided non-slip rug tape.  Do not have throw rugs and other things on the floor that can make you trip. What can I do in the  bedroom?  Use night lights.  Make sure that you have a light by your bed that is easy to reach.  Do not use any sheets or blankets that are too big for your bed. They should not hang down onto the floor.  Have a firm chair that has side arms. You can use this for support while you get dressed.  Do not have throw rugs and other things on the floor that can make you trip. What can I do in the kitchen?  Clean up any spills right away.  Avoid walking on wet floors.  Keep items that you use a lot in easy-to-reach places.  If you need to reach something above you, use a strong step stool that has a grab bar.  Keep electrical cords out of the way.  Do not use floor polish or wax that makes floors slippery. If you must use wax, use non-skid floor wax.  Do not have throw rugs and other things on the floor that can make you trip. What can I do with my stairs?  Do not leave any items on the stairs.  Make sure that there are handrails on both  sides of the stairs and use them. Fix handrails that are broken or loose. Make sure that handrails are as long as the stairways.  Check any carpeting to make sure that it is firmly attached to the stairs. Fix any carpet that is loose or worn.  Avoid having throw rugs at the top or bottom of the stairs. If you do have throw rugs, attach them to the floor with carpet tape.  Make sure that you have a light switch at the top of the stairs and the bottom of the stairs. If you do not have them, ask someone to add them for you. What else can I do to help prevent falls?  Wear shoes that:  Do not have high heels.  Have rubber bottoms.  Are comfortable and fit you well.  Are closed at the toe. Do not wear sandals.  If you use a stepladder:  Make sure that it is fully opened. Do not climb a closed stepladder.  Make sure that both sides of the stepladder are locked into place.  Ask someone to hold it for you, if possible.  Clearly mark and make  sure that you can see:  Any grab bars or handrails.  First and last steps.  Where the edge of each step is.  Use tools that help you move around (mobility aids) if they are needed. These include:  Canes.  Walkers.  Scooters.  Crutches.  Turn on the lights when you go into a dark area. Replace any light bulbs as soon as they burn out.  Set up your furniture so you have a clear path. Avoid moving your furniture around.  If any of your floors are uneven, fix them.  If there are any pets around you, be aware of where they are.  Review your medicines with your doctor. Some medicines can make you feel dizzy. This can increase your chance of falling. Ask your doctor what other things that you can do to help prevent falls. This information is not intended to replace advice given to you by your health care provider. Make sure you discuss any questions you have with your health care provider. Document Released: 02/18/2009 Document Revised: 09/30/2015 Document Reviewed: 05/29/2014 Elsevier Interactive Patient Education  2017 [employer].

## 2018-01-22 ENCOUNTER — Encounter: Payer: Self-pay | Admitting: Internal Medicine

## 2018-01-22 ENCOUNTER — Non-Acute Institutional Stay (SKILLED_NURSING_FACILITY): Payer: Medicare Other | Admitting: Internal Medicine

## 2018-01-22 DIAGNOSIS — E782 Mixed hyperlipidemia: Secondary | ICD-10-CM

## 2018-01-22 DIAGNOSIS — R7989 Other specified abnormal findings of blood chemistry: Secondary | ICD-10-CM | POA: Insufficient documentation

## 2018-01-22 DIAGNOSIS — R7401 Elevation of levels of liver transaminase levels: Secondary | ICD-10-CM

## 2018-01-22 DIAGNOSIS — R740 Nonspecific elevation of levels of transaminase and lactic acid dehydrogenase [LDH]: Secondary | ICD-10-CM | POA: Diagnosis not present

## 2018-01-22 DIAGNOSIS — I6990 Unspecified sequelae of unspecified cerebrovascular disease: Secondary | ICD-10-CM | POA: Diagnosis not present

## 2018-01-22 DIAGNOSIS — R1310 Dysphagia, unspecified: Secondary | ICD-10-CM

## 2018-01-22 DIAGNOSIS — D696 Thrombocytopenia, unspecified: Secondary | ICD-10-CM | POA: Diagnosis not present

## 2018-01-22 NOTE — Assessment & Plan Note (Addendum)
LDL at goal 

## 2018-01-22 NOTE — Assessment & Plan Note (Addendum)
His aphasia is manifested by grunts and unintelligible utterances Left hemiparesis unchanged Code status should be reassessed as even surviving such & getting back to present baseline is < 12 %

## 2018-01-22 NOTE — Assessment & Plan Note (Signed)
PEG tube dependent

## 2018-01-22 NOTE — Progress Notes (Signed)
    NURSING HOME LOCATION:  [facility] ROOM NUMBER:  130-A  CODE STATUS:  Full Code  PCP:  [name], MD  [place] [address]  This is a nursing facility follow up of chronic medical diagnoses.  Interim medical record and care since last [place] visit was updated with review of diagnostic studies and change in clinical status since last visit were documented.  HPI: He is a permanent resident of the facility with diagnoses of diabetes with neuropathy, history of GI bleed, R MCA stroke complicated by left hemiparesis and dysphagia, essential hypertension, coronary disease, and depressive psychosis. Most recent labs were 10/25/2017.  His creatinine and AST had risen slightly with values of 1.4 and 45 respectively.  Lipids were at goal except for extremely low HDL.  Platelet count had decreased slightly to 146,000 and hemoglobin had dropped from 14.7-13.3.  Review of systems:  Could not be completed due to his severe dysarthria with non discernable grunts  Physical exam:  Pertinent or positive findings: His mouth remains open and he constantly drools.  Teeth are markedly coated with plaque.  He has exotropia of the left eye.  Heart sounds are distant.  He has low-grade rhonchi greater on the right than the left.  PEG tube is present.  Pedal pulses are decreased.  Range of motion is best in the right upper extremity and decreased in the right lower extremity.  Left hemiparesis is present.  He exhibits contracture of the left upper extremity as well as the left knee.  He does have a flexion contracture of the fifth right finger.  General appearance: Adequately nourished; no acute distress, increased work of breathing is present.   Lymphatic: No lymphadenopathy about the head, neck, axilla. Eyes: No conjunctival inflammation or lid edema is present. There is no scleral icterus. Ears:  External ear exam shows no significant lesions or deformities.   Nose:   External nasal examination shows no deformity or inflammation. Nasal mucosa are pink and moist without lesions, exudates Oral exam:  Lips and gums are healthy appearing.  Neck:  No thyromegaly, masses, tenderness noted.    Heart:  Normal rate and regular rhythm. S1 and S2 normal without gallop, murmur, click, rub .  Lungs:  without wheezes, rales, rubs. Abdomen: Bowel sounds are normal. Abdomen is soft and nontender with no organomegaly, hernias, masses. GU: Deferred  Extremities:  No cyanosis, clubbing, edema  Skin: Warm & dry w/o tenting. No significant lesions or rash.  See summary under each active problem in the Problem List with associated updated therapeutic plan

## 2018-01-22 NOTE — Assessment & Plan Note (Signed)
Platelet count was 146,000 Hemoglobin dropped from 14.7 down to 13.3, still nonanemic CBC & dif

## 2018-01-22 NOTE — Patient Instructions (Signed)
See assessment and plan under each diagnosis in the problem list and acutely for this visit 

## 2018-01-22 NOTE — Assessment & Plan Note (Addendum)
On statin Fasting LFTs

## 2018-01-22 NOTE — Assessment & Plan Note (Signed)
Recheck BMET 

## 2018-01-23 ENCOUNTER — Encounter: Payer: Self-pay | Admitting: Internal Medicine

## 2018-01-23 LAB — BASIC METABOLIC PANEL
BUN: 31 — AB (ref 4–21)
Creatinine: 1.2 (ref 0.6–1.3)
Glucose: 122
POTASSIUM: 4.5 (ref 3.4–5.3)
Sodium: 138 (ref 137–147)

## 2018-01-23 LAB — HEPATIC FUNCTION PANEL
ALT: 32 (ref 10–40)
AST: 29 (ref 14–40)
Alkaline Phosphatase: 137 — AB (ref 25–125)
BILIRUBIN, TOTAL: 0.3
Bilirubin, Direct: 0.2 (ref 0.01–0.4)

## 2018-01-23 LAB — CBC AND DIFFERENTIAL
HCT: 40 — AB (ref 41–53)
HEMOGLOBIN: 13 — AB (ref 13.5–17.5)
NEUTROS ABS: 3
PLATELETS: 144 — AB (ref 150–399)
WBC: 6.5

## 2018-03-28 ENCOUNTER — Encounter (HOSPITAL_COMMUNITY): Payer: Self-pay

## 2018-03-28 ENCOUNTER — Emergency Department (HOSPITAL_COMMUNITY)
Admission: EM | Admit: 2018-03-28 | Discharge: 2018-03-29 | Disposition: A | Discharge status: Home / Self Care | Payer: Medicare Other | Attending: Emergency Medicine | Admitting: Emergency Medicine

## 2018-03-28 ENCOUNTER — Other Ambulatory Visit: Payer: Self-pay

## 2018-03-28 DIAGNOSIS — Z87891 Personal history of nicotine dependence: Secondary | ICD-10-CM | POA: Diagnosis not present

## 2018-03-28 DIAGNOSIS — E119 Type 2 diabetes mellitus without complications: Secondary | ICD-10-CM | POA: Diagnosis not present

## 2018-03-28 DIAGNOSIS — I10 Essential (primary) hypertension: Secondary | ICD-10-CM | POA: Insufficient documentation

## 2018-03-28 DIAGNOSIS — Z434 Encounter for attention to other artificial openings of digestive tract: Secondary | ICD-10-CM

## 2018-03-28 DIAGNOSIS — Z79899 Other long term (current) drug therapy: Secondary | ICD-10-CM | POA: Insufficient documentation

## 2018-03-28 DIAGNOSIS — Z431 Encounter for attention to gastrostomy: Secondary | ICD-10-CM | POA: Insufficient documentation

## 2018-03-28 DIAGNOSIS — K9423 Gastrostomy malfunction: Secondary | ICD-10-CM | POA: Diagnosis not present

## 2018-03-28 DIAGNOSIS — I69391 Dysphagia following cerebral infarction: Secondary | ICD-10-CM | POA: Insufficient documentation

## 2018-03-28 DIAGNOSIS — Z09 Encounter for follow-up examination after completed treatment for conditions other than malignant neoplasm: Secondary | ICD-10-CM

## 2018-03-28 DIAGNOSIS — T85528A Displacement of other gastrointestinal prosthetic devices, implants and grafts, initial encounter: Secondary | ICD-10-CM

## 2018-03-28 MED ORDER — LIDOCAINE HCL URETHRAL/MUCOSAL 2 % EX GEL
1.0000 "application " | Freq: Once | CUTANEOUS | Status: AC
  Administered 2018-03-28: 1 via TOPICAL
  Filled 2018-03-28: qty 20

## 2018-03-28 NOTE — ED Notes (Signed)
ED Provider at bedside. 

## 2018-03-28 NOTE — ED Triage Notes (Signed)
Pt here from [facility] for having the feeding tube removed by the patient. Pt hx of stroke with left sided deficits.  Unable to answer questions due to deficits but able to follow commands.

## 2018-03-28 NOTE — ED Provider Notes (Signed)
[facility] EMERGENCY DEPARTMENT Provider Note   CSN: [id number] Arrival date & time: 03/28/18  2219     History   Chief Complaint Chief Complaint  Patient presents with  . Abdominal Pain    PEG tube removed    HPI Sean Collins is a 67 y.o. male.  HPI 67 year old male with past medical history as below here with accidental dislodgment of G-tube.  Patient currently has a 20 [place] G-tube in.  He has history of stroke and chronic dysphasia related to this.  Just prior to arrival, the patient reportedly pulled out his NG tube.  There is moderate bleeding at the time.  The G-tube has been in for at least 6 months.  On my assessment, he does shake his head yes to mild pain around the site.  Remainder of history limited due to aphasia.  Level 5 caveat invoked as remainder of history, ROS, and physical exam limited due to patient's aphasia.   Past Medical History:  Diagnosis Date  . Diabetes mellitus without complication ([place])   . Dysarthria   . Dysphagia   . GI bleed   . Hyperlipidemia   . Hypertension   . Renal insufficiency 09/22/2016  . Stroke [facility])     Patient Active Problem List   Diagnosis Date Noted  . Elevated serum creatinine 01/22/2018  . Elevated AST (SGOT) 01/22/2018  . Thrombocytopenia ([place]) 01/22/2018  . Renal insufficiency 09/22/2016  . Type 2 diabetes mellitus with diabetic nephropathy, without long-term current use of insulin ([place]) 09/13/2016  . Depressive psychosis ([place]) 09/13/2016  . Cervical myofascial pain syndrome 08/29/2016  . Cervical facet joint syndrome 08/29/2016  . Recurrent falls 07/11/2016  . Major depressive disorder, recurrent episode ([place]) 04/17/2016  . Shoulder pain, left 02/07/2016  . Essential hypertension 12/08/2015  . Late effects of CVA (cerebrovascular accident) 12/08/2015  . Cerebrovascular accident (CVA) due to thrombosis of right middle cerebral artery ([place])   . Slurred speech   . UGIB (upper  gastrointestinal bleed)   . Malnutrition of moderate degree 11/04/2015  . Hematemesis without nausea   . Left hemiparesis ([place])   . Dysphagia   . Dysphagia due to recent stroke   . Hyperlipidemia 11/16/2011  . Gout 11/16/2011  . History of stroke 11/16/2011  . CAD (coronary artery disease) 11/16/2011    Past Surgical History:  Procedure Laterality Date  . ESOPHAGOGASTRODUODENOSCOPY (EGD) WITH PROPOFOL N/A 11/03/2015   Procedure: ESOPHAGOGASTRODUODENOSCOPY (EGD) WITH PROPOFOL;  Surgeon: Manus Gunning, MD;  Location: [facility];  Service: Gastroenterology;  Laterality: N/A;  . IR REPLC GASTRO/COLONIC TUBE PERCUT W/FLUORO  12/29/2016        Home Medications    Prior to Admission medications   Medication Sig Start Date End Date Taking? Authorizing Provider  acetaminophen (TYLENOL) 650 MG CR tablet 650 mg every 6 (six) hours as needed for pain or fever.     [provider]  allopurinol (ZYLOPRIM) 150 mg TABS tablet Place 150 mg into feeding tube daily.    [provider]  amLODipine (NORVASC) 10 MG tablet Place 10 mg into feeding tube daily.    [provider]  aspirin 325 MG tablet Place 325 mg into feeding tube daily.    [provider]  atenolol (TENORMIN) 50 MG tablet Place 50 mg into feeding tube 2 (two) times daily.    [provider]  atorvastatin (LIPITOR) 80 MG tablet Place 80 mg into feeding tube daily.     [provider]  Carboxymeth-Glycerin-Polysorb (REFRESH OPTIVE ADVANCED OP) Apply 1 drop to eye 4 (four) times daily.    [provider]  carboxymethylcellulose (REFRESH PLUS) 0.5 % SOLN Place 1 drop into both eyes 3 (three) times daily as needed (dry eyes).    [provider]  cholecalciferol (VITAMIN D) 1000 UNITS tablet 1,000 Units daily. Give via PEG    [provider]  divalproex (DEPAKOTE) 125 MG DR tablet 125 mg 2 (two) times daily. Give via tube    [provider]    enalapril (VASOTEC) 20 MG tablet Take 1 tablet (20 mg total) by mouth daily. 11/18/11   [name], MD  folic acid (FOLVITE) 1 MG tablet Place 1 mg into feeding tube daily.     [provider]  gabapentin (NEURONTIN) 100 MG capsule 100 mg at bedtime. Take via PEG    [provider]  isosorbide dinitrate (ISORDIL) 30 MG tablet Place 30 mg into feeding tube daily.    [provider]  Menthol, Topical Analgesic, (BIOFREEZE) 4 % GEL Apply 1 application topically 2 (two) times daily. Apply to left hand QA and QPM for pain    [provider]  Nutritional Supplements (ISOSOURCE 1.5 CAL) LIQD Give 65 mL/hr by tube continuous. This will provide 2340 kcal/day.  125 mL flush q4h and 30 mL with meds.    [provider]  QUEtiapine (SEROQUEL) 25 MG tablet Place 25 mg into feeding tube 2 (two) times daily. 1200 and 1800     [provider]  ranitidine (ZANTAC) 15 MG/ML syrup Place into feeding tube 2 (two) times daily. Take 5 mL q12h    [provider]    Family History Family History  Problem Relation Age of Onset  . Stroke Brother   . Diabetes Brother   . Stroke Brother   . Stroke Maternal Aunt     Social History Social History   Tobacco Use  . Smoking status: Former Smoker    Packs/day: 0.50    Years: 32.00    Pack years: 16.00    Types: Cigarettes  . Smokeless tobacco: Never Used  . Tobacco comment: Started at age 89 though quit for 10 years at one point  Substance Use Topics  . Alcohol use: No    Alcohol/week: 0.0 standard drinks  . Drug use: No     Allergies   Patient has no known allergies.   Review of Systems Review of Systems  Unable to perform ROS: Patient nonverbal     Physical Exam Updated Vital Signs BP (!) 145/76   Pulse 73   Temp 98.3 F (36.8 C) (Oral)   Resp 19   SpO2 98%   Physical Exam  Constitutional: He appears well-developed and well-nourished. No distress.  HENT:  Head: Normocephalic  and atraumatic.  Eyes: Conjunctivae are normal.  Neck: Neck supple.  Cardiovascular: Normal rate, regular rhythm and normal heart sounds. Exam reveals no friction rub.  No murmur heard. Pulmonary/Chest: Effort normal and breath sounds normal. No respiratory distress. He has no wheezes. He has no rales.  Abdominal: He exhibits no distension.  Granuloma noted at G-Tube site, with small, adhered clot and small amount of dark red blood around G-Tube site. Mild surrounding TTP.   Musculoskeletal: He exhibits no edema.  Neurological: He is alert. He exhibits normal muscle tone.  Chronic severe aphasia. L sided weakness, at baseline.  Skin: Skin is warm. Capillary refill takes less than 2 seconds.  Psychiatric:  He has a normal mood and affect.  Nursing note and vitals reviewed.    ED Treatments / Results  Labs (all labs ordered are listed, but only abnormal results are displayed) Labs Reviewed - No data to display  EKG None  Radiology Dg Abdomen Peg Tube Location  Result Date: 03/29/2018 CLINICAL DATA:  Follow-up after gastrostomy tube placement. Gastrografin administered. EXAM: ABDOMEN - 1 VIEW COMPARISON:  None. FINDINGS: A single image of the abdomen was obtained after contrast material was instilled into the gastrostomy tube. A gastrostomy tube is present positioned along the greater curvature of the stomach. Retention balloon appears to be inflated with air. Contrast material is demonstrated in the stomach and duodenum. No contrast extravasation is demonstrated. Inferior vena caval filter. Prominent aorta iliac calcifications. Postoperative left hip hemiarthroplasty. IMPRESSION: Gastrostomy tube appears in satisfactory position. Electronically Signed   By: Lucienne Capers M.D.   On: 03/29/2018 01:21    Procedures Gastrostomy tube replacement Date/Time: 03/29/2018 1:30 AM Performed by: Duffy Bruce, MD Authorized by: Duffy Bruce, MD  Consent: Verbal consent obtained. Risks  and benefits: risks, benefits and alternatives were discussed Preparation: Patient was prepped and draped in the usual sterile fashion. Local anesthesia used: no  Anesthesia: Local anesthesia used: no  Sedation: Patient sedated: no  Patient tolerance: Patient tolerated the procedure well with no immediate complications Comments: The G-tube tract was noted to be mildly edematous.  The tract was anesthetized with viscous lidocaine.  The tract was then gently probed using a cotton tip applicator.  An 7 [place] G-tube was then introduced, allowed to slowly dilate, then replaced with a 20 [place] G-tube.  Patient tolerated well.  Gastric contents easily aspirated.    (including critical care time)  Medications Ordered in ED Medications  lidocaine (XYLOCAINE) 2 % jelly 1 application (1 application Topical Given 03/28/18 2349)  iopamidol (ISOVUE-300) 61 % injection (50 mLs PEG Tube Contrast Given 03/29/18 0115)     Initial Impression / Assessment and Plan / ED Course  I have reviewed the triage vital signs and the nursing notes.  Pertinent labs & imaging results that were available during my care of the patient were reviewed by me and considered in my medical decision making (see chart for details).     67 year old male here with dislodgment of G-tube.  He has mild mucosal trauma likely due to pulling out his previous tube with the balloon.  The tract was anesthetized with lidocaine and replaced.  There is mild edema initially limiting placement of a 20 [place], but following placement of an 23 [place], I was able to replace with a 20 [place] G-tube.  Post insertion plain films show appropriate placement.  There is no bleeding.  Discharged with outpatient follow-up.  Final Clinical Impressions(s) / ED Diagnoses   Final diagnoses:  S/P gastrostomy tube (G tube) placement, follow-up exam  Gastrojejunostomy tube dislodgement [facility])    ED Discharge Orders    None       Duffy Bruce,  MD 03/29/18 [phone]

## 2018-03-29 ENCOUNTER — Emergency Department (HOSPITAL_COMMUNITY): Payer: Medicare Other

## 2018-03-29 MED ORDER — IOPAMIDOL (ISOVUE-300) INJECTION 61%
INTRAVENOUS | Status: AC
  Administered 2018-03-29: 50 mL via GASTROSTOMY
  Filled 2018-03-29: qty 50

## 2018-03-29 NOTE — ED Notes (Signed)
ptar called 

## 2018-04-12 LAB — FECAL OCCULT BLOOD, GUAIAC: Fecal Occult Blood: NEGATIVE

## 2018-04-23 ENCOUNTER — Non-Acute Institutional Stay (SKILLED_NURSING_FACILITY): Payer: Medicare Other | Admitting: Internal Medicine

## 2018-04-23 ENCOUNTER — Encounter: Payer: Self-pay | Admitting: Internal Medicine

## 2018-04-23 DIAGNOSIS — I69391 Dysphagia following cerebral infarction: Secondary | ICD-10-CM | POA: Diagnosis not present

## 2018-04-23 DIAGNOSIS — I10 Essential (primary) hypertension: Secondary | ICD-10-CM

## 2018-04-23 DIAGNOSIS — E1121 Type 2 diabetes mellitus with diabetic nephropathy: Secondary | ICD-10-CM | POA: Diagnosis not present

## 2018-04-23 DIAGNOSIS — D492 Neoplasm of unspecified behavior of bone, soft tissue, and skin: Secondary | ICD-10-CM | POA: Diagnosis not present

## 2018-04-23 DIAGNOSIS — D696 Thrombocytopenia, unspecified: Secondary | ICD-10-CM

## 2018-04-23 NOTE — Assessment & Plan Note (Addendum)
Update A1c ?

## 2018-04-23 NOTE — Progress Notes (Signed)
    NURSING HOME LOCATION:  [facility] ROOM NUMBER:  130-A  CODE STATUS:  Full Code  PCP:  [name], MD  [place] [address]  This is a nursing facility follow of chronic medical diagnoses.  Interim medical record and care since last [place] visit was updated with review of diagnostic studies and change in clinical status since last visit were documented.  HPI: He was admitted 11/08/2015 and is a permanent resident of the facility.  Medical diagnoses include coronary artery disease, left hemiparesis and dysphagia in the context of prior R MCA stroke, essential hypertension, diabetes with nephropathy, depressive psychosis, and history of GI bleed. Most recent labs performed 01/23/2018 revealed BUN of 31 and alkaline phosphatase of 137.  He has had a slight progressive drop in his hemoglobin/hematocrit.  The values have dropped from 13.3/42 down to 13/40.  He also has a mild thrombocytopenia.  His last A1c was 10/25/2017 and revealed an A1c of 6.6%.  Review of systems: He is essentially nonverbal related to his stroke.  Replies are gravelly and garbled and unintelligible.  He will sometimes shake his head yes or no.  He seemed to indicate he was having diarrhea, but staff denied such.  Staff said there are no new issues except for a lesion above the PEG tube.  Physical exam:  Pertinent or positive findings: Speech pattern as noted.  He lies in the bed leaning to the right.  He has a towel in his mouth to collect secretions.  His face does sag to the right.  Pattern alopecia is present.  He has extremely poor hygiene with marked coating to the teeth.  The heart sounds are distant & cannot really be auscultated reliably.  Chest is surprisingly clear.  PEG tube is present.  There is a keratinized, granulomatous lesion above the tube.  Left hemiparesis is present.  General appearance:  no acute distress, increased work of breathing is present.   Lymphatic: No  lymphadenopathy about the head, neck, axilla. Eyes: No conjunctival inflammation or lid edema is present. There is no scleral icterus. Ears:  External ear exam shows no significant lesions or deformities.   Nose:  External nasal examination shows no deformity or inflammation. Nasal mucosa are pink and moist without lesions, exudates Neck:  No thyromegaly, masses, tenderness noted.    Lungs:  without wheezes, rhonchi, rales, rubs. Abdomen: Bowel sounds are normal. Abdomen is soft and nontender with no organomegaly, hernias GU: Deferred  Extremities:  No cyanosis, clubbing, edema  Skin: Warm & dry w/o tenting. No significant  rash.  See summary under each active problem in the Problem List with associated updated therapeutic plan

## 2018-04-23 NOTE — Assessment & Plan Note (Addendum)
No bleeding reported Recheck CBC

## 2018-04-23 NOTE — Assessment & Plan Note (Signed)
Probably related to trauma at the PEG site Surgical consultation if lesion progresses or associated with complications

## 2018-04-23 NOTE — Patient Instructions (Signed)
See assessment and plan under each diagnosis in the problem list and acutely for this visit 

## 2018-04-23 NOTE — Assessment & Plan Note (Signed)
Continue PEG feedings

## 2018-04-23 NOTE — Assessment & Plan Note (Signed)
Decrease amlodipine to 5 mg daily as blood pressure not at goal as requested by Neurology

## 2018-05-22 LAB — CBC AND DIFFERENTIAL
HCT: 41 (ref 41–53)
HEMOGLOBIN: 13.6 (ref 13.5–17.5)
Neutrophils Absolute: 3
Platelets: 139 — AB (ref 150–399)
WBC: 7

## 2018-05-22 LAB — BASIC METABOLIC PANEL
BUN: 38 — AB (ref 4–21)
CREATININE: 1.2 (ref 0.6–1.3)
GLUCOSE: 131
POTASSIUM: 4.3 (ref 3.4–5.3)
Sodium: 143 (ref 137–147)

## 2018-05-22 LAB — HEPATIC FUNCTION PANEL
ALT: 30 (ref 10–40)
AST: 29 (ref 14–40)
Alkaline Phosphatase: 132 — AB (ref 25–125)
Bilirubin, Total: 0.3

## 2018-05-22 LAB — VITAMIN B12: Vitamin B-12: 1193

## 2018-07-01 ENCOUNTER — Encounter: Payer: Self-pay | Admitting: Internal Medicine

## 2018-07-01 NOTE — Progress Notes (Signed)
Location:    [facility] Room Number: 130/A Place of Service:  SNF [phone]) Provider:  Loraine Maple, MD  Patient Care Team: [name], MD as PCP - General (Internal Medicine) [place] as Referring Physician (General Practice) Letta Pate Luanna Salk, MD as Consulting Physician (Physical Medicine and Rehabilitation) Rosalin Hawking, MD as Consulting Physician (Neurology)  Extended Emergency Contact Information Primary Emergency Contact: Ivan Anchors [place] Phone: [phone] Relation: Brother  Code Status:  Full Code Goals of care: Advanced Directive information Advanced Directives 07/01/2018  Does Patient Have a Medical Advance Directive? Yes  Type of Advance Directive (No Data)  Does patient want to make changes to medical advance directive? No - Patient declined  Copy of [place] in Chart? -  Would patient like information on creating a medical advance directive? -     No chief complaint on file.   HPI:  Pt is a 68 y.o. male seen today for an acute visit for    Past Medical History:  Diagnosis Date  . Diabetes mellitus without complication ([place])   . Dysarthria   . Dysphagia   . GI bleed   . Hyperlipidemia   . Hypertension   . Renal insufficiency 09/22/2016  . Stroke [facility])    Past Surgical History:  Procedure Laterality Date  . ESOPHAGOGASTRODUODENOSCOPY (EGD) WITH PROPOFOL N/A 11/03/2015   Procedure: ESOPHAGOGASTRODUODENOSCOPY (EGD) WITH PROPOFOL;  Surgeon: Manus Gunning, MD;  Location: [facility];  Service: Gastroenterology;  Laterality: N/A;  . IR REPLC GASTRO/COLONIC TUBE PERCUT W/FLUORO  12/29/2016    No Known Allergies  Outpatient Encounter Medications as of 07/01/2018  Medication Sig  . acetaminophen (TYLENOL) 650 MG CR tablet 650 mg every 6 (six) hours as needed for pain or fever.   Marland Kitchen allopurinol (ZYLOPRIM) 150 mg TABS tablet Place  150 mg into feeding tube daily.  Marland Kitchen amLODipine (NORVASC) 5 MG tablet Place 5 mg into feeding tube daily.  Marland Kitchen aspirin 325 MG tablet Place 325 mg into feeding tube daily.  Marland Kitchen atenolol (TENORMIN) 50 MG tablet Place 50 mg into feeding tube 2 (two) times daily.  Marland Kitchen atorvastatin (LIPITOR) 80 MG tablet Place 80 mg into feeding tube daily.   . carboxymethylcellulose (REFRESH PLUS) 0.5 % SOLN Place 1 drop into both eyes 3 (three) times daily as needed (dry eyes).  . cholecalciferol (VITAMIN D) 1000 UNITS tablet 1,000 Units daily. Give via PEG  . divalproex (DEPAKOTE) 125 MG DR tablet 125 mg 2 (two) times daily. Give via tube  . enalapril (VASOTEC) 20 MG tablet Take 1 tablet (20 mg total) by mouth daily.  . folic acid (FOLVITE) 1 MG tablet Place 1 mg into feeding tube daily.   Marland Kitchen gabapentin (NEURONTIN) 100 MG capsule 100 mg at bedtime. Take via PEG  . isosorbide dinitrate (ISORDIL) 30 MG tablet Place 30 mg into feeding tube daily.  . Menthol, Topical Analgesic, (BIOFREEZE) 4 % GEL Apply 1 application topically 2 (two) times daily. Apply to left hand QA and QPM for pain  . Nutritional Supplements (ISOSOURCE 1.5 CAL) LIQD Give 65 mL/hr by tube continuous. This will provide 2340 kcal/day.  125 mL flush q4h and 30 mL with meds.  Marland Kitchen QUEtiapine (SEROQUEL) 25 MG tablet Take 12.5 mg by mouth every morning and 1 tablet by mouth at bedtime and 25 mg by mouth  for Delusional  . ranitidine (ZANTAC) 15 MG/ML syrup  Place into feeding tube 2 (two) times daily. Take 5 mL q12h  . [DISCONTINUED] Carboxymeth-Glycerin-Polysorb (REFRESH OPTIVE ADVANCED OP) Apply 1 drop to eye 4 (four) times daily.   No facility-administered encounter medications on file as of 07/01/2018.     Review of Systems  Immunization History  Administered Date(s) Administered  . Influenza-Unspecified 06/21/2015, 03/01/2018  . PPD Test 11/08/2015  . Pneumococcal Conjugate-13 09/06/2017, 03/16/2018  . Pneumococcal-Unspecified 06/21/2015   Pertinent   Health Maintenance Due  Topic Date Due  . HEMOGLOBIN A1C  07/30/2018 (Originally 04/26/2018)  . COLONOSCOPY  07/30/2018 (Originally 10/15/2000)  . OPHTHALMOLOGY EXAM  10/06/2018  . FOOT EXAM  11/03/2018  . PNA vac Low Risk Adult (2 of 2 - PPSV23) 06/20/2020  . INFLUENZA VACCINE  Completed   Fall Risk  01/03/2018 03/09/2017 01/02/2017 11/15/2016 10/13/2016  Falls in the past year? Yes Exclusion - non ambulatory Yes No Yes  Comment - - - - -  Number falls in past yr: 1 - 2 or more - 2 or more  Comment - - - - -  Injury with Fall? Yes - No - No  Risk Factor Category  - - - - High Fall Risk  Risk for fall due to : - - - - -  Follow up - - - - -   Functional Status Survey:    There were no vitals filed for this visit. There is no height or weight on file to calculate BMI. Physical Exam  Labs reviewed: Recent Labs    10/25/17 11/05/17 01/23/18 05/22/18  NA 140  --  138 143  K 5.7* 4.5 4.5 4.3  BUN 43*  --  31* 38*  CREATININE 1.4*  --  1.2 1.2   Recent Labs    10/25/17 01/23/18 05/22/18  AST 45* 29 29  ALT 37 32 30  ALKPHOS 142* 137* 132*   Recent Labs    10/25/17 01/23/18 05/22/18  WBC 7.3 6.5 7.0  NEUTROABS 4 3 3   HGB 13.3* 13.0* 13.6  HCT 42 40* 41  PLT 146* 144* 139*   Lab Results  Component Value Date   TSH 2.56 10/25/2017   Lab Results  Component Value Date   HGBA1C 6.6 10/25/2017   Lab Results  Component Value Date   CHOL 96 10/25/2017   HDL 29 (A) 10/25/2017   LDLCALC 50 10/25/2017   TRIG 86 10/25/2017   CHOLHDL 3.2 10/24/2015    Significant Diagnostic Results in last 30 days:  No results found.  Assessment/Plan There are no diagnoses linked to this encounter.      Oralia Manis, [place].

## 2018-07-03 NOTE — Progress Notes (Signed)
This encounter was created in error - please disregard.

## 2018-07-04 ENCOUNTER — Encounter: Payer: Self-pay | Admitting: Internal Medicine

## 2018-07-04 ENCOUNTER — Non-Acute Institutional Stay (SKILLED_NURSING_FACILITY): Payer: Medicare Other | Admitting: Internal Medicine

## 2018-07-04 DIAGNOSIS — R509 Fever, unspecified: Secondary | ICD-10-CM

## 2018-07-04 DIAGNOSIS — R0689 Other abnormalities of breathing: Secondary | ICD-10-CM

## 2018-07-04 DIAGNOSIS — I959 Hypotension, unspecified: Secondary | ICD-10-CM

## 2018-07-04 NOTE — Patient Instructions (Signed)
See assessment and plan acutely for this visit ? ?

## 2018-07-04 NOTE — Progress Notes (Signed)
   NURSING HOME LOCATION:  [facility] ROOM NUMBER: 130/A   CODE STATUS:  Full Code  PCP:  [name] MD.  This is a nursing facility follow up for specific acute issue of congestion and fever.  Interim medical record and care since last [place] visit was updated with review of diagnostic studies and change in clinical status since last visit were documented.  HPI: Clinical chest congestion, nonproductive cough, & low grade fever were documented by staff. Chest x-ray was reported as showing "no active disease".  The film was personally reviewed.  Marked diffuse increased interstitial markings were present.  Calcium deposit in the hila suggests remote granulomatous disease. Lateral film was uninterpretable due to technique issues. No definite infiltrate was present.  Review of systems: All history was from staff.  Patient is nonverbal  Physical exam:  Pertinent or positive findings: The patient stares blankly at the examiner.  He exhibits an intermittent hacking, nonproductive cough.  Coarse rhonchi are audible at bedside even w/o the stethoscope.  There appears to be right facial droop.  He holds a towel in the R corner of his mouth.  Dental hygiene is very poor with coating of the teeth. On auscultation coarse rhonchi are present diffusely, greater on the right than the left.  Heart sounds are totally obscured.  Peg tube is present.  There is no tone in the left upper and left lower extremities.  When these are lifted they dropped to the bed.  There was slight tone in the right upper extremity greater than the right lower extremity.  Interosseous wasting is present.  Hands cool; O2 sats cannot be registered.  General appearance: no increased work of breathing is present.   Lymphatic: No lymphadenopathy about the head, neck, axilla. Eyes: No conjunctival inflammation or lid edema is present. There is no scleral icterus. Ears:  External ear exam shows no significant  lesions or deformities.   Nose:  External nasal examination shows no deformity or inflammation. Nasal mucosa are pink and moist without lesions, exudates Oral exam:  Lips and gums are healthy appearing. Neck:  No thyromegaly, masses, tenderness noted.    Abdomen: Bowel sounds are normal. Abdomen is soft and nontender with no organomegaly, hernias, masses. GU: Deferred  Extremities:  No cyanosis, clubbing, edema  Skin: w/o tenting. No significant lesions or rash.  See summary under each active problem in the Problem List with associated updated therapeutic plan

## 2018-07-05 LAB — CBC AND DIFFERENTIAL
HCT: 46 (ref 41–53)
Hemoglobin: 14.8 (ref 13.5–17.5)
Platelets: 96 — AB (ref 150–399)
WBC: 20.7

## 2018-07-05 LAB — BASIC METABOLIC PANEL
BUN: 99 — AB (ref 4–21)
CREATININE: 2 — AB (ref 0.6–1.3)
Creatinine: 2 — AB (ref 0.6–1.3)
Glucose: 220
Sodium: 157 — AB (ref 137–147)

## 2018-07-06 ENCOUNTER — Inpatient Hospital Stay (HOSPITAL_COMMUNITY)
Admission: EM | Admit: 2018-07-06 | Discharge: 2018-07-10 | DRG: 871 | Disposition: A | Discharge status: Skilled Nursing Facility | Payer: Medicare Other | Attending: Internal Medicine | Admitting: Internal Medicine

## 2018-07-06 ENCOUNTER — Emergency Department (HOSPITAL_COMMUNITY): Payer: Medicare Other

## 2018-07-06 ENCOUNTER — Encounter (HOSPITAL_COMMUNITY): Payer: Self-pay

## 2018-07-06 ENCOUNTER — Other Ambulatory Visit: Payer: Self-pay

## 2018-07-06 DIAGNOSIS — R059 Cough, unspecified: Secondary | ICD-10-CM

## 2018-07-06 DIAGNOSIS — A419 Sepsis, unspecified organism: Secondary | ICD-10-CM | POA: Diagnosis not present

## 2018-07-06 DIAGNOSIS — I2510 Atherosclerotic heart disease of native coronary artery without angina pectoris: Secondary | ICD-10-CM | POA: Diagnosis present

## 2018-07-06 DIAGNOSIS — J9601 Acute respiratory failure with hypoxia: Secondary | ICD-10-CM | POA: Diagnosis present

## 2018-07-06 DIAGNOSIS — E785 Hyperlipidemia, unspecified: Secondary | ICD-10-CM | POA: Diagnosis present

## 2018-07-06 DIAGNOSIS — Z87891 Personal history of nicotine dependence: Secondary | ICD-10-CM

## 2018-07-06 DIAGNOSIS — E870 Hyperosmolality and hypernatremia: Secondary | ICD-10-CM | POA: Diagnosis present

## 2018-07-06 DIAGNOSIS — I69320 Aphasia following cerebral infarction: Secondary | ICD-10-CM

## 2018-07-06 DIAGNOSIS — E860 Dehydration: Secondary | ICD-10-CM

## 2018-07-06 DIAGNOSIS — I129 Hypertensive chronic kidney disease with stage 1 through stage 4 chronic kidney disease, or unspecified chronic kidney disease: Secondary | ICD-10-CM | POA: Diagnosis present

## 2018-07-06 DIAGNOSIS — I69354 Hemiplegia and hemiparesis following cerebral infarction affecting left non-dominant side: Secondary | ICD-10-CM

## 2018-07-06 DIAGNOSIS — I10 Essential (primary) hypertension: Secondary | ICD-10-CM | POA: Diagnosis not present

## 2018-07-06 DIAGNOSIS — J690 Pneumonitis due to inhalation of food and vomit: Secondary | ICD-10-CM

## 2018-07-06 DIAGNOSIS — E1165 Type 2 diabetes mellitus with hyperglycemia: Secondary | ICD-10-CM | POA: Diagnosis present

## 2018-07-06 DIAGNOSIS — Z7982 Long term (current) use of aspirin: Secondary | ICD-10-CM

## 2018-07-06 DIAGNOSIS — Z79899 Other long term (current) drug therapy: Secondary | ICD-10-CM

## 2018-07-06 DIAGNOSIS — N179 Acute kidney failure, unspecified: Secondary | ICD-10-CM | POA: Diagnosis present

## 2018-07-06 DIAGNOSIS — Z6826 Body mass index (BMI) 26.0-26.9, adult: Secondary | ICD-10-CM

## 2018-07-06 DIAGNOSIS — Z833 Family history of diabetes mellitus: Secondary | ICD-10-CM

## 2018-07-06 DIAGNOSIS — B370 Candidal stomatitis: Secondary | ICD-10-CM | POA: Diagnosis present

## 2018-07-06 DIAGNOSIS — R944 Abnormal results of kidney function studies: Secondary | ICD-10-CM | POA: Diagnosis not present

## 2018-07-06 DIAGNOSIS — E861 Hypovolemia: Secondary | ICD-10-CM | POA: Diagnosis present

## 2018-07-06 DIAGNOSIS — E441 Mild protein-calorie malnutrition: Secondary | ICD-10-CM | POA: Diagnosis present

## 2018-07-06 DIAGNOSIS — F419 Anxiety disorder, unspecified: Secondary | ICD-10-CM | POA: Diagnosis present

## 2018-07-06 DIAGNOSIS — E875 Hyperkalemia: Secondary | ICD-10-CM | POA: Diagnosis present

## 2018-07-06 DIAGNOSIS — Z931 Gastrostomy status: Secondary | ICD-10-CM

## 2018-07-06 DIAGNOSIS — J9811 Atelectasis: Secondary | ICD-10-CM | POA: Diagnosis present

## 2018-07-06 DIAGNOSIS — E1122 Type 2 diabetes mellitus with diabetic chronic kidney disease: Secondary | ICD-10-CM | POA: Diagnosis present

## 2018-07-06 DIAGNOSIS — D6959 Other secondary thrombocytopenia: Secondary | ICD-10-CM | POA: Diagnosis present

## 2018-07-06 DIAGNOSIS — E1121 Type 2 diabetes mellitus with diabetic nephropathy: Secondary | ICD-10-CM | POA: Diagnosis present

## 2018-07-06 DIAGNOSIS — N182 Chronic kidney disease, stage 2 (mild): Secondary | ICD-10-CM | POA: Diagnosis present

## 2018-07-06 DIAGNOSIS — R4701 Aphasia: Secondary | ICD-10-CM

## 2018-07-06 DIAGNOSIS — Z8673 Personal history of transient ischemic attack (TIA), and cerebral infarction without residual deficits: Secondary | ICD-10-CM

## 2018-07-06 DIAGNOSIS — I69391 Dysphagia following cerebral infarction: Secondary | ICD-10-CM

## 2018-07-06 DIAGNOSIS — F339 Major depressive disorder, recurrent, unspecified: Secondary | ICD-10-CM | POA: Diagnosis present

## 2018-07-06 DIAGNOSIS — R05 Cough: Secondary | ICD-10-CM

## 2018-07-06 LAB — RESPIRATORY PANEL BY PCR
Adenovirus: NOT DETECTED
Bordetella pertussis: NOT DETECTED
Chlamydophila pneumoniae: NOT DETECTED
Coronavirus 229E: NOT DETECTED
Coronavirus HKU1: NOT DETECTED
Coronavirus NL63: NOT DETECTED
Coronavirus OC43: NOT DETECTED
Influenza A: NOT DETECTED
Influenza B: NOT DETECTED
Metapneumovirus: NOT DETECTED
Mycoplasma pneumoniae: NOT DETECTED
PARAINFLUENZA VIRUS 1-RVPPCR: NOT DETECTED
Parainfluenza Virus 2: NOT DETECTED
Parainfluenza Virus 3: NOT DETECTED
Parainfluenza Virus 4: NOT DETECTED
RHINOVIRUS / ENTEROVIRUS - RVPPCR: NOT DETECTED
Respiratory Syncytial Virus: NOT DETECTED

## 2018-07-06 LAB — CBC WITH DIFFERENTIAL/PLATELET
BASOS PCT: 0 %
BLASTS: 0 %
Band Neutrophils: 0 %
Basophils Absolute: 0 10*3/uL (ref 0.0–0.1)
EOS PCT: 0 %
Eosinophils Absolute: 0 10*3/uL (ref 0.0–0.5)
HCT: 52.6 % — ABNORMAL HIGH (ref 39.0–52.0)
Hemoglobin: 16.2 g/dL (ref 13.0–17.0)
LYMPHS ABS: 1 10*3/uL (ref 0.7–4.0)
Lymphocytes Relative: 6 %
MCH: 28.2 pg (ref 26.0–34.0)
MCHC: 30.8 g/dL (ref 30.0–36.0)
MCV: 91.6 fL (ref 80.0–100.0)
MONO ABS: 0.7 10*3/uL (ref 0.1–1.0)
MONOS PCT: 4 %
Metamyelocytes Relative: 0 %
Myelocytes: 0 %
NEUTROS ABS: 15.2 10*3/uL — AB (ref 1.7–7.7)
NEUTROS PCT: 90 %
NRBC: 0 /100{WBCs}
NRBC: 0.4 % — AB (ref 0.0–0.2)
Other: 0 %
PLATELETS: DECREASED 10*3/uL (ref 150–400)
Promyelocytes Relative: 0 %
RBC: 5.74 MIL/uL (ref 4.22–5.81)
RDW: 17.7 % — AB (ref 11.5–15.5)
WBC: 16.9 10*3/uL — ABNORMAL HIGH (ref 4.0–10.5)

## 2018-07-06 LAB — BASIC METABOLIC PANEL
ANION GAP: 7 (ref 5–15)
Anion gap: 9 (ref 5–15)
Anion gap: 5 (ref 5–15)
BUN: 103 mg/dL — AB (ref 8–23)
BUN: 103 mg/dL — ABNORMAL HIGH (ref 8–23)
BUN: 100 mg/dL — ABNORMAL HIGH (ref 8–23)
CALCIUM: 9 mg/dL (ref 8.9–10.3)
CO2: 18 mmol/L — AB (ref 22–32)
CO2: 21 mmol/L — ABNORMAL LOW (ref 22–32)
CO2: 21 mmol/L — ABNORMAL LOW (ref 22–32)
Calcium: 8.8 mg/dL — ABNORMAL LOW (ref 8.9–10.3)
Calcium: 8.6 mg/dL — ABNORMAL LOW (ref 8.9–10.3)
Chloride: 127 mmol/L — ABNORMAL HIGH (ref 98–111)
Chloride: 123 mmol/L — ABNORMAL HIGH (ref 98–111)
Chloride: 125 mmol/L — ABNORMAL HIGH (ref 98–111)
Creatinine, Ser: 2.15 mg/dL — ABNORMAL HIGH (ref 0.61–1.24)
Creatinine, Ser: 2.11 mg/dL — ABNORMAL HIGH (ref 0.61–1.24)
Creatinine, Ser: 1.96 mg/dL — ABNORMAL HIGH (ref 0.61–1.24)
GFR calc Af Amer: 36 mL/min — ABNORMAL LOW (ref >60)
GFR calc Af Amer: 36 mL/min — ABNORMAL LOW (ref >60)
GFR calc non Af Amer: 31 mL/min — ABNORMAL LOW (ref >60)
GFR calc non Af Amer: 31 mL/min — ABNORMAL LOW (ref >60)
GFR calc non Af Amer: 34 mL/min — ABNORMAL LOW (ref >60)
GFR, EST AFRICAN AMERICAN: 40 mL/min — AB (ref >60)
GLUCOSE: 262 mg/dL — AB (ref 70–99)
Glucose, Bld: 262 mg/dL — ABNORMAL HIGH (ref 70–99)
Glucose, Bld: 307 mg/dL — ABNORMAL HIGH (ref 70–99)
POTASSIUM: 5.3 mmol/L — AB (ref 3.5–5.1)
Potassium: 5.4 mmol/L — ABNORMAL HIGH (ref 3.5–5.1)
Potassium: 5.4 mmol/L — ABNORMAL HIGH (ref 3.5–5.1)
Sodium: 152 mmol/L — ABNORMAL HIGH (ref 135–145)
Sodium: 153 mmol/L — ABNORMAL HIGH (ref 135–145)
Sodium: 151 mmol/L — ABNORMAL HIGH (ref 135–145)

## 2018-07-06 LAB — CREATININE, URINE, RANDOM: Creatinine, Urine: 136.12 mg/dL

## 2018-07-06 LAB — GLUCOSE, CAPILLARY
GLUCOSE-CAPILLARY: 227 mg/dL — AB (ref 70–99)
GLUCOSE-CAPILLARY: 304 mg/dL — AB (ref 70–99)
Glucose-Capillary: 237 mg/dL — ABNORMAL HIGH (ref 70–99)
Glucose-Capillary: 266 mg/dL — ABNORMAL HIGH (ref 70–99)
Glucose-Capillary: 222 mg/dL — ABNORMAL HIGH (ref 70–99)

## 2018-07-06 LAB — PROCALCITONIN: Procalcitonin: 0.32 ng/mL

## 2018-07-06 LAB — SODIUM, URINE, RANDOM: Sodium, Ur: <10 mmol/L

## 2018-07-06 LAB — LACTIC ACID, PLASMA
Lactic Acid, Venous: 2.3 mmol/L (ref 0.5–1.9)
Lactic Acid, Venous: 2.6 mmol/L (ref 0.5–1.9)
Lactic Acid, Venous: 2.5 mmol/L (ref 0.5–1.9)
Lactic Acid, Venous: 2.2 mmol/L (ref 0.5–1.9)
Lactic Acid, Venous: 1.8 mmol/L (ref 0.5–1.9)

## 2018-07-06 LAB — HIV ANTIBODY (ROUTINE TESTING W REFLEX): HIV Screen 4th Generation wRfx: NONREACTIVE

## 2018-07-06 LAB — MRSA PCR SCREENING: MRSA by PCR: POSITIVE — AB

## 2018-07-06 LAB — INFLUENZA PANEL BY PCR (TYPE A & B)
INFLAPCR: NEGATIVE
Influenza B By PCR: NEGATIVE

## 2018-07-06 LAB — VALPROIC ACID LEVEL: Valproic Acid Lvl: 24 ug/mL — ABNORMAL LOW (ref 50.0–100.0)

## 2018-07-06 LAB — BRAIN NATRIURETIC PEPTIDE: B Natriuretic Peptide: 259.7 pg/mL — ABNORMAL HIGH (ref 0.0–100.0)

## 2018-07-06 LAB — OSMOLALITY, URINE: Osmolality, Ur: 775 mOsm/kg (ref 300–900)

## 2018-07-06 MED ORDER — IPRATROPIUM-ALBUTEROL 0.5-2.5 (3) MG/3ML IN SOLN: 3.0000 mL | Freq: Four times a day (QID) | RESPIRATORY_TRACT | Status: DC

## 2018-07-06 MED ORDER — ALBUTEROL SULFATE (2.5 MG/3ML) 0.083% IN NEBU: 2.5000 mg | INHALATION_SOLUTION | RESPIRATORY_TRACT | Status: DC | PRN

## 2018-07-06 MED ORDER — SODIUM CHLORIDE 0.9 % IV SOLN
1.0000 g | INTRAVENOUS | Status: DC
  Administered 2018-07-06 – 2018-07-10 (×5): 1 g via INTRAVENOUS
  Filled 2018-07-06 (×6): qty 10

## 2018-07-06 MED ORDER — SODIUM CHLORIDE 0.9% FLUSH
3.0000 mL | Freq: Two times a day (BID) | INTRAVENOUS | Status: DC
  Administered 2018-07-08 – 2018-07-10 (×4): 3 mL via INTRAVENOUS

## 2018-07-06 MED ORDER — AMLODIPINE BESYLATE 5 MG PO TABS
5.0000 mg | ORAL_TABLET | Freq: Every day | ORAL | Status: DC
  Administered 2018-07-06 – 2018-07-10 (×5): 5 mg
  Filled 2018-07-06 (×5): qty 1

## 2018-07-06 MED ORDER — FREE WATER: 200.0000 mL | Freq: Three times a day (TID) | Status: DC

## 2018-07-06 MED ORDER — ASPIRIN 325 MG PO TABS
325.0000 mg | ORAL_TABLET | Freq: Every day | ORAL | Status: DC
  Administered 2018-07-06 – 2018-07-10 (×5): 325 mg
  Filled 2018-07-06 (×5): qty 1

## 2018-07-06 MED ORDER — INSULIN GLARGINE 100 UNIT/ML ~~LOC~~ SOLN
5.0000 [IU] | Freq: Every day | SUBCUTANEOUS | Status: DC
  Administered 2018-07-06 – 2018-07-10 (×5): 5 [IU] via SUBCUTANEOUS
  Filled 2018-07-06 (×5): qty 0.05

## 2018-07-06 MED ORDER — CHLORHEXIDINE GLUCONATE CLOTH 2 % EX PADS
6.0000 | MEDICATED_PAD | Freq: Every day | CUTANEOUS | Status: AC
  Administered 2018-07-06 – 2018-07-10 (×5): 6 via TOPICAL

## 2018-07-06 MED ORDER — GABAPENTIN 100 MG PO CAPS
100.0000 mg | ORAL_CAPSULE | Freq: Every day | ORAL | Status: DC
  Administered 2018-07-06 – 2018-07-10 (×5): 100 mg via ORAL
  Filled 2018-07-06 (×5): qty 1

## 2018-07-06 MED ORDER — ISOSOURCE 1.5 CAL PO LIQD: 65.0000 mL/h | ORAL | Status: DC

## 2018-07-06 MED ORDER — INSULIN ASPART 100 UNIT/ML ~~LOC~~ SOLN
0.0000 [IU] | SUBCUTANEOUS | Status: DC
  Administered 2018-07-06: 3 [IU] via SUBCUTANEOUS
  Administered 2018-07-06: 7 [IU] via SUBCUTANEOUS
  Administered 2018-07-06: 3 [IU] via SUBCUTANEOUS

## 2018-07-06 MED ORDER — POLYVINYL ALCOHOL 1.4 % OP SOLN: 1.0000 [drp] | Freq: Three times a day (TID) | OPHTHALMIC | Status: DC | PRN

## 2018-07-06 MED ORDER — ONDANSETRON HCL 4 MG PO TABS: 4.0000 mg | ORAL_TABLET | Freq: Four times a day (QID) | ORAL | Status: DC | PRN

## 2018-07-06 MED ORDER — ACETAMINOPHEN 325 MG PO TABS: 650.0000 mg | ORAL_TABLET | Freq: Four times a day (QID) | ORAL | Status: DC | PRN

## 2018-07-06 MED ORDER — IPRATROPIUM-ALBUTEROL 0.5-2.5 (3) MG/3ML IN SOLN
3.0000 mL | Freq: Three times a day (TID) | RESPIRATORY_TRACT | Status: DC
  Administered 2018-07-06 – 2018-07-10 (×14): 3 mL via RESPIRATORY_TRACT
  Filled 2018-07-06 (×14): qty 3

## 2018-07-06 MED ORDER — MUPIROCIN 2 % EX OINT
1.0000 "application " | TOPICAL_OINTMENT | Freq: Two times a day (BID) | CUTANEOUS | Status: DC
  Administered 2018-07-06 – 2018-07-10 (×9): 1 via NASAL
  Filled 2018-07-06 (×5): qty 22

## 2018-07-06 MED ORDER — ATENOLOL 50 MG PO TABS
50.0000 mg | ORAL_TABLET | Freq: Two times a day (BID) | ORAL | Status: DC
  Administered 2018-07-06 – 2018-07-10 (×10): 50 mg
  Filled 2018-07-06 (×10): qty 1

## 2018-07-06 MED ORDER — SODIUM CHLORIDE 0.9 % IV SOLN
500.0000 mg | INTRAVENOUS | Status: DC
  Administered 2018-07-06 – 2018-07-08 (×3): 500 mg via INTRAVENOUS
  Filled 2018-07-06 (×3): qty 500

## 2018-07-06 MED ORDER — QUETIAPINE FUMARATE 25 MG PO TABS: 25.0000 mg | ORAL_TABLET | ORAL | Status: DC

## 2018-07-06 MED ORDER — ALLOPURINOL 300 MG PO TABS
150.0000 mg | ORAL_TABLET | Freq: Every day | ORAL | Status: DC
  Administered 2018-07-06 – 2018-07-10 (×5): 150 mg
  Filled 2018-07-06 (×5): qty 1

## 2018-07-06 MED ORDER — QUETIAPINE FUMARATE 25 MG PO TABS
25.0000 mg | ORAL_TABLET | Freq: Every day | ORAL | Status: DC
  Administered 2018-07-06 – 2018-07-10 (×5): 25 mg
  Filled 2018-07-06 (×5): qty 1

## 2018-07-06 MED ORDER — INSULIN ASPART 100 UNIT/ML ~~LOC~~ SOLN: 0.0000 [IU] | Freq: Three times a day (TID) | SUBCUTANEOUS | Status: DC

## 2018-07-06 MED ORDER — IPRATROPIUM-ALBUTEROL 0.5-2.5 (3) MG/3ML IN SOLN: 3.0000 mL | Freq: Three times a day (TID) | RESPIRATORY_TRACT | Status: DC

## 2018-07-06 MED ORDER — DIVALPROEX SODIUM 125 MG PO CSDR
125.0000 mg | DELAYED_RELEASE_CAPSULE | Freq: Two times a day (BID) | ORAL | Status: DC
  Administered 2018-07-06 – 2018-07-10 (×10): 125 mg via ORAL
  Filled 2018-07-06 (×10): qty 1

## 2018-07-06 MED ORDER — FOLIC ACID 1 MG PO TABS
1.0000 mg | ORAL_TABLET | Freq: Every day | ORAL | Status: DC
  Administered 2018-07-06 – 2018-07-10 (×5): 1 mg
  Filled 2018-07-06 (×5): qty 1

## 2018-07-06 MED ORDER — ISOSORBIDE DINITRATE 30 MG PO TABS: 30.0000 mg | ORAL_TABLET | Freq: Every day | ORAL | Status: DC

## 2018-07-06 MED ORDER — NYSTATIN 100000 UNIT/GM EX POWD
Freq: Two times a day (BID) | CUTANEOUS | Status: DC
  Administered 2018-07-06 – 2018-07-10 (×8): via TOPICAL
  Filled 2018-07-06: qty 15

## 2018-07-06 MED ORDER — HYDROCODONE-ACETAMINOPHEN 5-325 MG PO TABS
1.0000 | ORAL_TABLET | ORAL | Status: DC | PRN
  Administered 2018-07-09: 1
  Filled 2018-07-06: qty 1

## 2018-07-06 MED ORDER — QUETIAPINE FUMARATE 25 MG PO TABS
12.5000 mg | ORAL_TABLET | Freq: Every day | ORAL | Status: DC
  Administered 2018-07-06 – 2018-07-10 (×5): 12.5 mg
  Filled 2018-07-06 (×5): qty 1

## 2018-07-06 MED ORDER — ACETAMINOPHEN 650 MG RE SUPP: 650.0000 mg | Freq: Four times a day (QID) | RECTAL | Status: DC | PRN

## 2018-07-06 MED ORDER — CHLORHEXIDINE GLUCONATE 0.12 % MT SOLN
15.0000 mL | Freq: Two times a day (BID) | OROMUCOSAL | Status: DC
  Administered 2018-07-06 – 2018-07-10 (×9): 15 mL via OROMUCOSAL
  Filled 2018-07-06 (×10): qty 15

## 2018-07-06 MED ORDER — FREE WATER
150.0000 mL | Status: DC
  Administered 2018-07-06 – 2018-07-07 (×5): 150 mL

## 2018-07-06 MED ORDER — SODIUM CHLORIDE 0.45 % IV SOLN
INTRAVENOUS | Status: AC
  Administered 2018-07-06: 05:00:00 via INTRAVENOUS

## 2018-07-06 MED ORDER — INSULIN ASPART 100 UNIT/ML ~~LOC~~ SOLN
0.0000 [IU] | SUBCUTANEOUS | Status: DC
  Administered 2018-07-06: 8 [IU] via SUBCUTANEOUS
  Administered 2018-07-06: 5 [IU] via SUBCUTANEOUS

## 2018-07-06 MED ORDER — ATORVASTATIN CALCIUM 80 MG PO TABS
80.0000 mg | ORAL_TABLET | Freq: Every day | ORAL | Status: DC
  Administered 2018-07-06 – 2018-07-10 (×5): 80 mg
  Filled 2018-07-06 (×5): qty 1

## 2018-07-06 MED ORDER — ONDANSETRON HCL 4 MG/2ML IJ SOLN: 4.0000 mg | Freq: Four times a day (QID) | INTRAMUSCULAR | Status: DC | PRN

## 2018-07-06 MED ORDER — RANITIDINE HCL 150 MG/10ML PO SYRP
75.0000 mg | ORAL_SOLUTION | Freq: Two times a day (BID) | ORAL | Status: DC
  Administered 2018-07-06 – 2018-07-10 (×10): 75 mg
  Filled 2018-07-06 (×11): qty 10

## 2018-07-06 MED ORDER — JEVITY 1.5 CAL/FIBER PO LIQD
65.0000 mL/h | ORAL | Status: DC
  Administered 2018-07-06 – 2018-07-10 (×5): 65 mL/h
  Filled 2018-07-06 (×9): qty 1000

## 2018-07-06 MED ORDER — ORAL CARE MOUTH RINSE
15.0000 mL | Freq: Two times a day (BID) | OROMUCOSAL | Status: DC
  Administered 2018-07-06 – 2018-07-10 (×10): 15 mL via OROMUCOSAL

## 2018-07-06 NOTE — H&P (Signed)
History and Physical    [place] Alas WUJ:[id number] DOB: [birth year] DOA: 07/06/2018  PCP: [name], MD   Patient coming from: SNF   Chief Complaint: Abnormal labs, cough, congestion, fever   HPI: Sean Collins is a 68 y.o. male with medical history significant for CVA with residual aphasia and hemiparesis, nonverbal at baseline and feeding tube dependent, depression, coronary artery disease, and diabetes mellitus, now presenting from his SNF for evaluation of abnormal labs in the setting of recent cough, fever, and congestion.  He was recently noted to have fever and cough at his nursing facility.  Per the nursing home documentation, chest x-ray was negative for active disease.  Patient is unable to contribute much to the history due to his clinical condition.  ED Course: Upon arrival to the ED, patient is found to be afebrile, saturating well on room air, and with vitals otherwise normal.  EKG features a sinus rhythm with LVH and repolarization abnormality.  Chest x-ray is notable for diffuse pulmonary interstitial findings that could reflect mild edema or atypical infection.  Chemistry panel is notable for a sodium of 152, potassium 5.3, BUN 103, and creatinine of 2.15, up from an apparent baseline of 1.2.  CBC features a leukocytosis to 16,900 and platelets are clumped.  Hospitalists were asked to admit.  Review of Systems:  Unable to complete ROS secondary to the patient's clinical condition.  Past Medical History:  Diagnosis Date  . Diabetes mellitus without complication ([place])   . Dysarthria   . Dysphagia   . GI bleed   . Hyperlipidemia   . Hypertension   . Renal insufficiency 09/22/2016  . Stroke [facility])     Past Surgical History:  Procedure Laterality Date  . ESOPHAGOGASTRODUODENOSCOPY (EGD) WITH PROPOFOL N/A 11/03/2015   Procedure: ESOPHAGOGASTRODUODENOSCOPY (EGD) WITH PROPOFOL;  Surgeon: Manus Gunning, MD;  Location: [facility];  Service:  Gastroenterology;  Laterality: N/A;  . IR REPLC GASTRO/COLONIC TUBE PERCUT W/FLUORO  12/29/2016     reports that he has quit smoking. His smoking use included cigarettes. He has a 16.00 pack-year smoking history. He has never used smokeless tobacco. He reports that he does not drink alcohol or use drugs.  No Known Allergies  Family History  Problem Relation Age of Onset  . Stroke Brother   . Diabetes Brother   . Stroke Brother   . Stroke Maternal Aunt      Prior to Admission medications   Medication Sig Start Date End Date Taking? Authorizing Provider  acetaminophen (TYLENOL) 650 MG CR tablet 650 mg every 6 (six) hours as needed for pain or fever.     [provider]  allopurinol (ZYLOPRIM) 150 mg TABS tablet Place 150 mg into feeding tube daily.    [provider]  amLODipine (NORVASC) 5 MG tablet Place 5 mg into feeding tube daily.    [provider]  aspirin 325 MG tablet Place 325 mg into feeding tube daily.    [provider]  atenolol (TENORMIN) 50 MG tablet Place 50 mg into feeding tube 2 (two) times daily.    [provider]  atorvastatin (LIPITOR) 80 MG tablet Place 80 mg into feeding tube daily.     [provider]  carboxymethylcellulose (REFRESH PLUS) 0.5 % SOLN Place 1 drop into both eyes 3 (three) times daily as needed (dry eyes).    [provider]  cholecalciferol (VITAMIN D) 1000 UNITS tablet 1,000 Units daily. Give via PEG  [provider]  divalproex (DEPAKOTE) 125 MG DR tablet 125 mg 2 (two) times daily. Give via tube    [provider]  enalapril (VASOTEC) 20 MG tablet Take 1 tablet (20 mg total) by mouth daily. 11/18/11   [name], MD  folic acid (FOLVITE) 1 MG tablet Place 1 mg into feeding tube daily.     [provider]  gabapentin (NEURONTIN) 100 MG capsule 100 mg at bedtime. Take via PEG    [provider]  guaiFENesin (ROBITUSSIN) 100 MG/5ML liquid Give 10  ml via g-tube x 5 days 07/03/18 07/08/18  [provider]  ipratropium-albuterol (DUONEB) 0.5-2.5 (3) MG/3ML SOLN Take 3 mLs by nebulization 4 (four) times daily. For 5 days stop date 07/08/2018    [provider]  isosorbide dinitrate (ISORDIL) 30 MG tablet Place 30 mg into feeding tube daily.    [provider]  Menthol, Topical Analgesic, (BIOFREEZE) 4 % GEL Apply 1 application topically 2 (two) times daily. Apply to left hand QA and QPM for pain    [provider]  Nutritional Supplements (ISOSOURCE 1.5 CAL) LIQD Give 65 mL/hr by tube continuous. This will provide 2340 kcal/day.  125 mL flush q4h and 30 mL with meds.    [provider]  QUEtiapine (SEROQUEL) 25 MG tablet Take 12.5 mg by mouth every morning and 1 tablet by mouth at bedtime and 25 mg by mouth  for Delusional    [provider]  QUEtiapine (SEROQUEL) 25 MG tablet Give 25 mg qhs for delusional D/O    [provider]  ranitidine (ZANTAC) 15 MG/ML syrup Place into feeding tube 2 (two) times daily. Take 5 mL q12h    [provider]    Physical Exam: Vitals:   07/06/18 0315 07/06/18 0330 07/06/18 0345 07/06/18 0355  BP: 104/81 96/64  94/68  Pulse: 78 75 75 74  Resp: 11 16 17 17   Temp:      TempSrc:      SpO2: 99% 97% 97% 97%  Weight:      Height:        Constitutional: NAD, calm  Eyes: PERTLA, lids and conjunctivae normal ENMT: Mucous membranes are moist. Posterior pharynx clear of any exudate or lesions.   Neck: normal, supple, no masses, no thyromegaly Respiratory: Scattered rhonchi. Frequent cough. No accessory muscle use.  Cardiovascular: S1 & S2 heard, regular rate and rhythm. No extremity edema.   Abdomen: No distension, no tenderness, soft. Bowel sounds normal.  Musculoskeletal: no clubbing / cyanosis. No joint deformity upper and lower extremities.   Skin: maceration about the scrotum. Warm, dry, well-perfused. Neurologic: left facial droop.  Expressive aphasia. Left hemiparesis.     Labs on Admission: I have personally reviewed following labs and imaging studies  CBC: Recent Labs  Lab 07/06/18 0250  WBC 16.9*  NEUTROABS 15.2*  HGB 16.2  HCT 52.6*  MCV 91.6  PLT PLATELET CLUMPS NOTED ON SMEAR, COUNT APPEARS DECREASED   Basic Metabolic Panel: Recent Labs  Lab 07/06/18 0130  NA 152*  K 5.3*  CL 127*  CO2 18*  GLUCOSE 262*  BUN 103*  CREATININE 2.15*  CALCIUM 9.0   GFR: Estimated Creatinine Clearance: 32.1 mL/min (A) (by C-G formula based on SCr of 2.15 mg/dL (H)). Liver Function Tests: No results for input(s): AST, ALT, ALKPHOS, BILITOT, PROT, ALBUMIN in the last 168 hours. No results for input(s): LIPASE, AMYLASE in the last 168 hours. No results for input(s): AMMONIA in  the last 168 hours. Coagulation Profile: No results for input(s): INR, PROTIME in the last 168 hours. Cardiac Enzymes: No results for input(s): CKTOTAL, CKMB, CKMBINDEX, TROPONINI in the last 168 hours. BNP (last 3 results) No results for input(s): PROBNP in the last 8760 hours. HbA1C: No results for input(s): HGBA1C in the last 72 hours. CBG: No results for input(s): GLUCAP in the last 168 hours. Lipid Profile: No results for input(s): CHOL, HDL, LDLCALC, TRIG, CHOLHDL, LDLDIRECT in the last 72 hours. Thyroid Function Tests: No results for input(s): TSH, T4TOTAL, FREET4, T3FREE, THYROIDAB in the last 72 hours. Anemia Panel: No results for input(s): VITAMINB12, FOLATE, FERRITIN, TIBC, IRON, RETICCTPCT in the last 72 hours. Urine analysis:    Component Value Date/Time   COLORURINE YELLOW 10/27/2015 0115   APPEARANCEUR HAZY (A) 10/27/2015 0115   LABSPEC 1.026 10/27/2015 0115   PHURINE 6.0 10/27/2015 0115   GLUCOSEU NEGATIVE 10/27/2015 0115   HGBUR TRACE (A) 10/27/2015 0115   BILIRUBINUR SMALL (A) 10/27/2015 0115   KETONESUR 15 (A) 10/27/2015 0115   PROTEINUR >300 (A) 10/27/2015 0115   UROBILINOGEN 1.0 09/21/2008 1431   NITRITE  NEGATIVE 10/27/2015 0115   LEUKOCYTESUR NEGATIVE 10/27/2015 0115   Sepsis Labs: @LABRCNTIP (procalcitonin:4,lacticidven:4) )No results found for this or any previous visit (from the past 240 hour(s)).   Radiological Exams on Admission: Dg Chest Port 1 View  Result Date: 07/06/2018 CLINICAL DATA:  Initial evaluation for acute cough, shortness of breath. EXAM: PORTABLE CHEST 1 VIEW COMPARISON:  Prior radiograph from 10/27/2015. FINDINGS: Cardiac and mediastinal silhouettes are stable in size and contour, and remain within normal limits. Lungs are hypoinflated. Diffuse pulmonary interstitial congestion, which could reflect pulmonary interstitial edema and/or atypical infection/bronchiolitis. Superimposed mild bibasilar atelectasis. No consolidative opacity. No pleural effusion. No pneumothorax. No acute osseous finding. IMPRESSION: 1. Diffuse pulmonary interstitial congestion, which could reflect mild diffuse pulmonary interstitial edema or possibly atypical infection/bronchiolitis. No consolidative opacity to suggest pneumonia. 2. Low lung volumes with superimposed mild bibasilar atelectasis. Electronically Signed   By: Jeannine Boga M.D.   On: 07/06/2018 02:19    EKG: Independently reviewed. Sinus rhythm, LVH with repolarization abnormality.   Assessment/Plan   1. Acute kidney injury  - SCr is 2.15 on admission, up from an apparent baseline of 1.2  - Likely prerenal azotemia in setting of recent febrile illness and apparent hypovolemia  - Check urine sodium and creatinine, renally-dose medications, avoid nephrotoxins, continue IVF hydration, repeat chem panel in am    2. Hypernatremia  - Serum sodium is 152 in ED, 155 when corrected for hyperglycemia  - Suspect he is both dehydrated and hypervolemic  - Start 0.45% saline infusion, increase volume of free-water feeding tube flushes, follow chemistries closely and adjust fluids as needed   3. Cough  - Patient has frequent cough,  scattered rhonchi on exam, no apparent distress   - CXR findings suggestive of mild interstitial edema vs atypical infection; overall picture more consistent with infection  - Check influenza PCR, check sputum culture, start azithromycin, continue nebs    4. Depression, anxiety  - Continue Seroquel and Depakote    5. History of CVA  - Residual aphasia, dysphagia, and left-sided hemiparesis  - Continue tube feeds, increase free-water flushes as above    6. Hypertension  - BP at goal  - Continue Norvasc and atenolol as tolerated   7. Type II DM  - A1c was 6.6% in June  - Check CBG's and use SSI with Novolog while in  hospital     DVT prophylaxis: SCD's Code Status: Full  Family Communication: Discussed with patient   Consults called: None Admission status: Observation     Vianne Bulls, MD [facility] Pager [phone]  If 7PM-7AM, please contact night-coverage [email or web address] Password [email or web address]  07/06/2018, 4:02 AM

## 2018-07-06 NOTE — Progress Notes (Signed)
Initial Nutrition Assessment  DOCUMENTATION CODES:   Not applicable  INTERVENTION:   Tube Feeding:  Jevity 1.5 at 65 ml/hr Provides 100 g of protein, 2340 kcals, 1185 mL of free water Meets 100% estimated calorie and protein needs  Once off IVF and hypernatremia resolves, pt needs 250 mL q 6 hours of free water to meet hydration needs. Total free water: 2185 mL  NUTRITION DIAGNOSIS:   Inadequate oral intake related to dysphagia as evidenced by NPO status.   GOAL:   Patient will meet greater than or equal to 90% of their needs   MONITOR:   TF tolerance, Labs, Weight trends, Skin  REASON FOR ASSESSMENT:   Consult Enteral/tube feeding initiation and management, Assessment of nutrition requirement/status  ASSESSMENT:   68 yo male admitted with AKI with hypernatremia. PMH includesCVA with residual aphasia, left-sided hemiparesis and dysphagia requiring feeding tube, DM, HTN, depression/anxiety.    Pt alert, vocalizes but makes only moans/sounds, no words. No family at bedside to obtain nutrition history  Jevity 1.5 infusing at 65 ml/hr via G-tube  Free water 150 mL q 4 hours per tube  CBGs >200 but noted HgbA1c 6.6  Current wt 80.4 kg; no weight loss per weight encounters over the past year   Labs: sodium 153(H), potassium 5.4 (H), BUN 103 (H), Creatinine 2.11; CBGs 227-304 Meds: 1/2 NS at 100 ml/hr   NUTRITION - FOCUSED PHYSICAL EXAM:    Most Recent Value  Orbital Region  No depletion  Upper Arm Region  No depletion  Thoracic and Lumbar Region  No depletion  Buccal Region  No depletion  Temple Region  No depletion  Clavicle Bone Region  No depletion  Clavicle and Acromion Bone Region  No depletion  Dorsal Hand  Mild depletion  Patellar Region  Mild depletion  Anterior Thigh Region  Mild depletion  Posterior Calf Region  Mild depletion  Edema (RD Assessment)  Mild       Diet Order:   Diet Order    None      EDUCATION NEEDS:   Not appropriate  for education at this time  Skin:  Skin Assessment: Reviewed RN Assessment  Last BM:  2/29  Height:   Ht Readings from Last 1 Encounters:  07/06/18 5\' 9"  (1.753 m)    Weight:   Wt Readings from Last 1 Encounters:  07/06/18 80.4 kg   BMI:  Body mass index is 26.18 kg/m.  Estimated Nutritional Needs:   Kcal:  2100-2400 kcals   Protein:  105-120 g  Fluid:  >/= 2 L    Kerman Passey MS, RD, LDN, CNSC [phone] Pager  [phone] Weekend/On-Call Pager

## 2018-07-06 NOTE — Progress Notes (Addendum)
Patient admitted after midnight. 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 Work up reveals AKI hypernatremia  PE:  Gen: lying in bed watching TV. Will smile and wave with right hand CV: rrr no mgr no LE edema Resp: no increased work of breathing. Respirations somewhat shallow. Good air movement in bases. Some rhonchi upper airway Abd: feeding tube intact. Soft abdomen +BS no guarding or rebounding  A/P  1. Acute kidney injury  - SCr is 2.15 on admission and 2.11 this am. Up from an apparent baseline of 1.2  Likely prerenal azotemia in setting of recent febrile illness and apparent hypovolemia. Urine osmolality 775, urine sodium less than 10 and urine creatinine 136.1. - avoid nephrotoxins - continue IVF hydration, -monitor urine output - repeat chem panel in am    2. Hypernatremia  - Serum sodium is 152 in ED, 155 when corrected for hyperglycemia. Trending up this am. Suspect he is both dehydrated and hypervolemic  -Continue 0.45% saline infusion, - increase volume of free-water feeding tube flushes. Of note, home dose is 170ml every 4 hours. Will increase to 113ml every 4 hours - follow chemistries closely and adjust fluids as needed   3. Cough  - Patient has frequent cough, scattered rhonchi on exam, no apparent distress. Influenza panel negative - CXR findings suggestive of mild interstitial edema vs atypical infection; overall picture more consistent with infection  - check sputum culture as able - continue azithromycin, - continue nebs    4. Depression, anxiety  - Continue Seroquel and Depakote    5. History of CVA  - Residual aphasia, dysphagia, and left-sided hemiparesis  - Continue tube feeds, increase free-water flushes as above    6. Hypertension  - BP at goal  - Continue Norvasc and atenolol as tolerated   7. Type II DM  - A1c was 6.6% in  June  - Check CBG's and use SSI with Novolog while in hospital    8. Hyperkalemia. k 5. 4. Trending up somewhat. Likely related to #1.  -consider lasix -monitor  Radene Gunning, NP

## 2018-07-06 NOTE — Progress Notes (Signed)
CRITICAL VALUE ALERT  Critical Value:  Lactic Acid 2.2  Date & Time Notied:  07/06/2018  1953  Provider Notified: Kennon Holter  Orders Received/Actions taken: No new orders

## 2018-07-06 NOTE — ED Triage Notes (Signed)
Pt arrived from [facility] via EMS for abnormal labs. K+ 5.4, Creatinine 2.02, BUN 99.3, NA 157, Chloride 125. Pt nonverbal at baseline and has gtube.

## 2018-07-06 NOTE — ED Provider Notes (Signed)
[facility] EMERGENCY DEPARTMENT Provider Note   CSN: [id number] Arrival date & time: 07/06/18  0115    History   Chief Complaint Chief Complaint  Patient presents with  . abnormal labs  LEvel 5 caveat as patient is nonverbal  HPI Sean Collins is a 68 y.o. male.     The history is provided by the EMS personnel. The history is limited by the condition of the patient.  Cough  Cough characteristics:  Productive Severity:  Moderate Onset quality:  Gradual Timing:  Intermittent Progression:  Worsening Chronicity:  New Relieved by:  Nothing Worsened by:  Nothing Patient with history of diabetes, stroke, dysarthria, dysphasia, nonverbal at baseline, presents from nursing facility for cough and abnormal labs. Patient has had recent productive cough and has been on antibiotics.  Labs were performed that revealed elevated creatinine and elevated potassium around 5.4 Patient was sent for further evaluation. Patient is unable to give any details  Past Medical History:  Diagnosis Date  . Diabetes mellitus without complication ([place])   . Dysarthria   . Dysphagia   . GI bleed   . Hyperlipidemia   . Hypertension   . Renal insufficiency 09/22/2016  . Stroke [facility])     Patient Active Problem List   Diagnosis Date Noted  . Neoplasm of skin of abdomen 04/23/2018  . Elevated serum creatinine 01/22/2018  . Elevated AST (SGOT) 01/22/2018  . Thrombocytopenia ([place]) 01/22/2018  . Renal insufficiency 09/22/2016  . Type 2 diabetes mellitus with diabetic nephropathy, without long-term current use of insulin ([place]) 09/13/2016  . 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 Cervical myofascial pain syndrome 08/29/2016  . Cervical facet joint syndrome 08/29/2016  . Recurrent falls 07/11/2016  . Major depressive disorder, recurrent episode ([place]) 04/17/2016  . Shoulder pain, left 02/07/2016  . Essential hypertension 12/08/2015  . Late effects of CVA (cerebrovascular  accident) 12/08/2015  . Cerebrovascular accident (CVA) due to thrombosis of right middle cerebral artery ([place])   . Slurred speech   . UGIB (upper gastrointestinal bleed)   . Malnutrition of moderate degree 11/04/2015  . Hematemesis without nausea   . Left hemiparesis ([place])   . Dysphagia   . Dysphagia due to old stroke   . Hyperlipidemia 11/16/2011  . Gout 11/16/2011  . History of stroke 11/16/2011  . CAD (coronary artery disease) 11/16/2011    Past Surgical History:  Procedure Laterality Date  . ESOPHAGOGASTRODUODENOSCOPY (EGD) WITH PROPOFOL N/A 11/03/2015   Procedure: ESOPHAGOGASTRODUODENOSCOPY (EGD) WITH PROPOFOL;  Surgeon: Manus Gunning, MD;  Location: [facility];  Service: Gastroenterology;  Laterality: N/A;  . IR REPLC GASTRO/COLONIC TUBE PERCUT W/FLUORO  12/29/2016        Home Medications    Prior to Admission medications   Medication Sig Start Date End Date Taking? Authorizing Provider  acetaminophen (TYLENOL) 650 MG CR tablet 650 mg every 6 (six) hours as needed for pain or fever.     [provider]  allopurinol (ZYLOPRIM) 150 mg TABS tablet Place 150 mg into feeding tube daily.    [provider]  amLODipine (NORVASC) 5 MG tablet Place 5 mg into feeding tube daily.    [provider]  aspirin 325 MG tablet Place 325 mg into feeding tube daily.    [provider]  atenolol (TENORMIN) 50 MG tablet Place 50 mg into feeding tube 2 (two) times daily.    [provider]  atorvastatin (LIPITOR) 80 MG tablet Place 80 mg into feeding tube daily.  [provider]  carboxymethylcellulose (REFRESH PLUS) 0.5 % SOLN Place 1 drop into both eyes 3 (three) times daily as needed (dry eyes).    [provider]  cholecalciferol (VITAMIN D) 1000 UNITS tablet 1,000 Units daily. Give via PEG    [provider]  divalproex (DEPAKOTE) 125 MG DR tablet 125 mg 2 (two) times daily. Give via tube    [provider]  enalapril (VASOTEC) 20 MG tablet Take 1 tablet (20 mg total) by mouth daily. 11/18/11   [name], MD  folic acid (FOLVITE) 1 MG tablet Place 1 mg into feeding tube daily.     [provider]  gabapentin (NEURONTIN) 100 MG capsule 100 mg at bedtime. Take via PEG    [provider]  guaiFENesin (ROBITUSSIN) 100 MG/5ML liquid Give 10 ml via g-tube x 5 days 07/03/18 07/08/18  [provider]  ipratropium-albuterol (DUONEB) 0.5-2.5 (3) MG/3ML SOLN Take 3 mLs by nebulization 4 (four) times daily. For 5 days stop date 07/08/2018    [provider]  isosorbide dinitrate (ISORDIL) 30 MG tablet Place 30 mg into feeding tube daily.    [provider]  Menthol, Topical Analgesic, (BIOFREEZE) 4 % GEL Apply 1 application topically 2 (two) times daily. Apply to left hand QA and QPM for pain    [provider]  Nutritional Supplements (ISOSOURCE 1.5 CAL) LIQD Give 65 mL/hr by tube continuous. This will provide 2340 kcal/day.  125 mL flush q4h and 30 mL with meds.    [provider]  QUEtiapine (SEROQUEL) 25 MG tablet Take 12.5 mg by mouth every morning and 1 tablet by mouth at bedtime and 25 mg by mouth  for Delusional    [provider]  QUEtiapine (SEROQUEL) 25 MG tablet Give 25 mg qhs for delusional D/O    [provider]  ranitidine (ZANTAC) 15 MG/ML syrup Place into feeding tube 2 (two) times daily. Take 5 mL q12h    [provider]    Family History Family History  Problem Relation Age of Onset  . Stroke Brother   . Diabetes Brother   . Stroke Brother   . Stroke Maternal Aunt     Social History Social History   Tobacco Use  . Smoking status: Former Smoker    Packs/day: 0.50    Years: 32.00    Pack years: 16.00    Types: Cigarettes  . Smokeless tobacco: Never Used  . Tobacco comment: Started at age 59 though quit for 10 years at one point  Substance Use Topics  . Alcohol use: No     Alcohol/week: 0.0 standard drinks  . Drug use: No     Allergies   Patient has no known allergies.   Review of Systems Review of Systems  Unable to perform ROS: Patient nonverbal  Respiratory: Positive for cough.      Physical Exam Updated Vital Signs BP 107/67 (BP Location: Left Arm)   Pulse 86   Temp (!) 97.3 F (36.3 C) (Temporal)   Resp (!) 24   Ht 1.753 m (5\' 9" )   Wt 68 kg   SpO2 95%   BMI 22.15 kg/m   Physical Exam CONSTITUTIONAL: Chronically ill-appearing, no acute distress HEAD: Normocephalic/atraumatic EYES: EOMI/PERRL ENMT: Mucous membranes moist, poor dentition NECK: supple no meningeal signs SPINE/BACK:entire spine nontender CV: S1/S2 noted, no murmurs/rubs/gallops noted LUNGS: Coarse breath sounds bilaterally, no distress noted ABDOMEN: soft, nontender, PEG tube in place no localized erythema or  drainage GU:no cva tenderness NEURO: Pt is awake/alert.  He is nonverbal, but will follow commands.  No acute distress EXTREMITIES: pulses normal/equal,  no deformities, no obvious tenderness SKIN: warm, color normal PSYCH: Unable to assess ED Treatments / Results  Labs (all labs ordered are listed, but only abnormal results are displayed) Labs Reviewed  BASIC METABOLIC PANEL - Abnormal; Notable for the following components:      Result Value   Sodium 152 (*)    Potassium 5.3 (*)    Chloride 127 (*)    CO2 18 (*)    Glucose, Bld 262 (*)    BUN 103 (*)    Creatinine, Ser 2.15 (*)    GFR calc non Af Amer 31 (*)    GFR calc Af Amer 36 (*)    All other components within normal limits  VALPROIC ACID LEVEL - Abnormal; Notable for the following components:   Valproic Acid Lvl 24 (*)    All other components within normal limits  CBC WITH DIFFERENTIAL/PLATELET - Abnormal; Notable for the following components:   WBC 16.9 (*)    HCT 52.6 (*)    RDW 17.7 (*)    nRBC 0.4 (*)    Neutro Abs 15.2 (*)    All other components within normal limits  EXPECTORATED  SPUTUM ASSESSMENT W REFEX TO RESP CULTURE  CBC WITH DIFFERENTIAL/PLATELET  HIV ANTIBODY (ROUTINE TESTING W REFLEX)  BASIC METABOLIC PANEL  BASIC METABOLIC PANEL  BASIC METABOLIC PANEL  INFLUENZA PANEL BY PCR (TYPE A & B)  CREATININE, URINE, RANDOM  SODIUM, URINE, RANDOM  OSMOLALITY, URINE  BRAIN NATRIURETIC PEPTIDE  LACTIC ACID, PLASMA    EKG EKG Interpretation  Date/Time:  Saturday July 06 2018 01:26:28 EST Ventricular Rate:  85 PR Interval:    QRS Duration: 104 QT Interval:  397 QTC Calculation: 473 R Axis:   -34 Text Interpretation:  Sinus rhythm Abnormal R-wave progression, early transition LVH with secondary repolarization abnormality Abnormal ekg Interpretation limited secondary to artifact Confirmed by Ripley Fraise [phone]) on 07/06/2018 1:40:20 AM   Radiology Dg Chest Port 1 View  Result Date: 07/06/2018 CLINICAL DATA:  Initial evaluation for acute cough, shortness of breath. EXAM: PORTABLE CHEST 1 VIEW COMPARISON:  Prior radiograph from 10/27/2015. FINDINGS: Cardiac and mediastinal silhouettes are stable in size and contour, and remain within normal limits. Lungs are hypoinflated. Diffuse pulmonary interstitial congestion, which could reflect pulmonary interstitial edema and/or atypical infection/bronchiolitis. Superimposed mild bibasilar atelectasis. No consolidative opacity. No pleural effusion. No pneumothorax. No acute osseous finding. IMPRESSION: 1. Diffuse pulmonary interstitial congestion, which could reflect mild diffuse pulmonary interstitial edema or possibly atypical infection/bronchiolitis. No consolidative opacity to suggest pneumonia. 2. Low lung volumes with superimposed mild bibasilar atelectasis. Electronically Signed   By: Jeannine Boga M.D.   On: 07/06/2018 02:19    Procedures Procedures (including critical care time)  Medications Ordered in ED Medications - No data to display   Initial Impression / Assessment and Plan / ED Course  I  have reviewed the triage vital signs and the nursing notes.  Pertinent labs & imaging results that were available during my care of the patient were reviewed by me and considered in my medical decision making (see chart for details).        In from nursing facility for dehydration, abnormal labs and cough. X-ray shows questionable interstitial edema He is also hypernatremic.  Discussed with Dr. Myna Hidalgo for admission. Patient is otherwise hemodynamically appropriate Final Clinical Impressions(s) / ED Diagnoses  Final diagnoses:  Dehydration  Hypernatremia    ED Discharge Orders    None       Ripley Fraise, MD 07/06/18 [phone]

## 2018-07-07 ENCOUNTER — Observation Stay (HOSPITAL_COMMUNITY): Payer: Medicare Other

## 2018-07-07 DIAGNOSIS — R509 Fever, unspecified: Secondary | ICD-10-CM | POA: Diagnosis not present

## 2018-07-07 DIAGNOSIS — I10 Essential (primary) hypertension: Secondary | ICD-10-CM | POA: Diagnosis not present

## 2018-07-07 DIAGNOSIS — E872 Acidosis: Secondary | ICD-10-CM | POA: Diagnosis present

## 2018-07-07 DIAGNOSIS — I69391 Dysphagia following cerebral infarction: Secondary | ICD-10-CM | POA: Diagnosis not present

## 2018-07-07 DIAGNOSIS — E441 Mild protein-calorie malnutrition: Secondary | ICD-10-CM | POA: Diagnosis present

## 2018-07-07 DIAGNOSIS — F419 Anxiety disorder, unspecified: Secondary | ICD-10-CM | POA: Diagnosis present

## 2018-07-07 DIAGNOSIS — Z6826 Body mass index (BMI) 26.0-26.9, adult: Secondary | ICD-10-CM | POA: Diagnosis not present

## 2018-07-07 DIAGNOSIS — I69320 Aphasia following cerebral infarction: Secondary | ICD-10-CM | POA: Diagnosis not present

## 2018-07-07 DIAGNOSIS — I2510 Atherosclerotic heart disease of native coronary artery without angina pectoris: Secondary | ICD-10-CM | POA: Diagnosis present

## 2018-07-07 DIAGNOSIS — I129 Hypertensive chronic kidney disease with stage 1 through stage 4 chronic kidney disease, or unspecified chronic kidney disease: Secondary | ICD-10-CM | POA: Diagnosis present

## 2018-07-07 DIAGNOSIS — J690 Pneumonitis due to inhalation of food and vomit: Secondary | ICD-10-CM | POA: Diagnosis present

## 2018-07-07 DIAGNOSIS — E1121 Type 2 diabetes mellitus with diabetic nephropathy: Secondary | ICD-10-CM | POA: Diagnosis not present

## 2018-07-07 DIAGNOSIS — Z743 Need for continuous supervision: Secondary | ICD-10-CM | POA: Diagnosis not present

## 2018-07-07 DIAGNOSIS — D6959 Other secondary thrombocytopenia: Secondary | ICD-10-CM | POA: Diagnosis present

## 2018-07-07 DIAGNOSIS — N182 Chronic kidney disease, stage 2 (mild): Secondary | ICD-10-CM | POA: Diagnosis present

## 2018-07-07 DIAGNOSIS — E1165 Type 2 diabetes mellitus with hyperglycemia: Secondary | ICD-10-CM | POA: Diagnosis present

## 2018-07-07 DIAGNOSIS — R279 Unspecified lack of coordination: Secondary | ICD-10-CM | POA: Diagnosis not present

## 2018-07-07 DIAGNOSIS — F339 Major depressive disorder, recurrent, unspecified: Secondary | ICD-10-CM | POA: Diagnosis present

## 2018-07-07 DIAGNOSIS — E1122 Type 2 diabetes mellitus with diabetic chronic kidney disease: Secondary | ICD-10-CM | POA: Diagnosis present

## 2018-07-07 DIAGNOSIS — E870 Hyperosmolality and hypernatremia: Secondary | ICD-10-CM | POA: Diagnosis present

## 2018-07-07 DIAGNOSIS — E860 Dehydration: Secondary | ICD-10-CM | POA: Diagnosis present

## 2018-07-07 DIAGNOSIS — E861 Hypovolemia: Secondary | ICD-10-CM | POA: Diagnosis present

## 2018-07-07 DIAGNOSIS — N179 Acute kidney failure, unspecified: Secondary | ICD-10-CM | POA: Diagnosis not present

## 2018-07-07 DIAGNOSIS — G459 Transient cerebral ischemic attack, unspecified: Secondary | ICD-10-CM | POA: Diagnosis not present

## 2018-07-07 DIAGNOSIS — Z8673 Personal history of transient ischemic attack (TIA), and cerebral infarction without residual deficits: Secondary | ICD-10-CM | POA: Diagnosis not present

## 2018-07-07 DIAGNOSIS — I69354 Hemiplegia and hemiparesis following cerebral infarction affecting left non-dominant side: Secondary | ICD-10-CM | POA: Diagnosis not present

## 2018-07-07 DIAGNOSIS — J9601 Acute respiratory failure with hypoxia: Secondary | ICD-10-CM | POA: Diagnosis present

## 2018-07-07 DIAGNOSIS — J9811 Atelectasis: Secondary | ICD-10-CM | POA: Diagnosis present

## 2018-07-07 DIAGNOSIS — Z931 Gastrostomy status: Secondary | ICD-10-CM | POA: Diagnosis not present

## 2018-07-07 DIAGNOSIS — J189 Pneumonia, unspecified organism: Secondary | ICD-10-CM | POA: Diagnosis not present

## 2018-07-07 DIAGNOSIS — R1310 Dysphagia, unspecified: Secondary | ICD-10-CM | POA: Diagnosis present

## 2018-07-07 DIAGNOSIS — A419 Sepsis, unspecified organism: Secondary | ICD-10-CM | POA: Diagnosis present

## 2018-07-07 DIAGNOSIS — B370 Candidal stomatitis: Secondary | ICD-10-CM | POA: Diagnosis present

## 2018-07-07 DIAGNOSIS — E875 Hyperkalemia: Secondary | ICD-10-CM | POA: Diagnosis present

## 2018-07-07 DIAGNOSIS — R944 Abnormal results of kidney function studies: Secondary | ICD-10-CM | POA: Diagnosis present

## 2018-07-07 LAB — BASIC METABOLIC PANEL
Anion gap: 7 (ref 5–15)
Anion gap: 7 (ref 5–15)
BUN: 89 mg/dL — ABNORMAL HIGH (ref 8–23)
BUN: 72 mg/dL — ABNORMAL HIGH (ref 8–23)
CALCIUM: 8.7 mg/dL — AB (ref 8.9–10.3)
CO2: 19 mmol/L — ABNORMAL LOW (ref 22–32)
CO2: 23 mmol/L (ref 22–32)
Calcium: 8.4 mg/dL — ABNORMAL LOW (ref 8.9–10.3)
Chloride: 126 mmol/L — ABNORMAL HIGH (ref 98–111)
Chloride: 126 mmol/L — ABNORMAL HIGH (ref 98–111)
Creatinine, Ser: 1.72 mg/dL — ABNORMAL HIGH (ref 0.61–1.24)
Creatinine, Ser: 1.61 mg/dL — ABNORMAL HIGH (ref 0.61–1.24)
GFR calc Af Amer: 47 mL/min — ABNORMAL LOW (ref >60)
GFR calc Af Amer: 51 mL/min — ABNORMAL LOW (ref >60)
GFR calc non Af Amer: 40 mL/min — ABNORMAL LOW (ref >60)
GFR calc non Af Amer: 44 mL/min — ABNORMAL LOW (ref >60)
Glucose, Bld: 241 mg/dL — ABNORMAL HIGH (ref 70–99)
Glucose, Bld: 101 mg/dL — ABNORMAL HIGH (ref 70–99)
Potassium: 5.2 mmol/L — ABNORMAL HIGH (ref 3.5–5.1)
Potassium: 4.7 mmol/L (ref 3.5–5.1)
Sodium: 152 mmol/L — ABNORMAL HIGH (ref 135–145)
Sodium: 156 mmol/L — ABNORMAL HIGH (ref 135–145)

## 2018-07-07 LAB — CBC
HCT: 43.1 % (ref 39.0–52.0)
Hemoglobin: 13.1 g/dL (ref 13.0–17.0)
MCH: 28.3 pg (ref 26.0–34.0)
MCHC: 30.4 g/dL (ref 30.0–36.0)
MCV: 93.1 fL (ref 80.0–100.0)
Platelets: UNDETERMINED 10*3/uL (ref 150–400)
RBC: 4.63 MIL/uL (ref 4.22–5.81)
RDW: 17 % — ABNORMAL HIGH (ref 11.5–15.5)
WBC: 21.2 10*3/uL — AB (ref 4.0–10.5)
nRBC: 0.2 % (ref 0.0–0.2)

## 2018-07-07 LAB — GLUCOSE, CAPILLARY
GLUCOSE-CAPILLARY: 219 mg/dL — AB (ref 70–99)
GLUCOSE-CAPILLARY: 122 mg/dL — AB (ref 70–99)
GLUCOSE-CAPILLARY: 130 mg/dL — AB (ref 70–99)
Glucose-Capillary: 95 mg/dL (ref 70–99)

## 2018-07-07 LAB — PROCALCITONIN: Procalcitonin: 0.24 ng/mL

## 2018-07-07 LAB — STREP PNEUMONIAE URINARY ANTIGEN: Strep Pneumo Urinary Antigen: NEGATIVE

## 2018-07-07 LAB — PREALBUMIN: Prealbumin: 16.9 mg/dL — ABNORMAL LOW (ref 18–38)

## 2018-07-07 MED ORDER — DEXTROSE 5 % IV SOLN
INTRAVENOUS | Status: DC
  Administered 2018-07-07 – 2018-07-08 (×2): via INTRAVENOUS

## 2018-07-07 MED ORDER — INSULIN ASPART 100 UNIT/ML ~~LOC~~ SOLN
0.0000 [IU] | Freq: Three times a day (TID) | SUBCUTANEOUS | Status: DC
  Administered 2018-07-07: 5 [IU] via SUBCUTANEOUS

## 2018-07-07 MED ORDER — POLYVINYL ALCOHOL 1.4 % OP SOLN
1.0000 [drp] | Freq: Three times a day (TID) | OPHTHALMIC | Status: DC
  Administered 2018-07-07 – 2018-07-10 (×10): 1 [drp] via OPHTHALMIC
  Filled 2018-07-07: qty 15

## 2018-07-07 MED ORDER — GUAIFENESIN 100 MG/5ML PO SOLN
10.0000 mL | ORAL | Status: DC | PRN
  Administered 2018-07-09: 200 mg
  Filled 2018-07-07: qty 5

## 2018-07-07 MED ORDER — INSULIN ASPART 100 UNIT/ML ~~LOC~~ SOLN
0.0000 [IU] | SUBCUTANEOUS | Status: DC
  Administered 2018-07-07: 1 [IU] via SUBCUTANEOUS
  Administered 2018-07-08 (×3): 2 [IU] via SUBCUTANEOUS
  Administered 2018-07-08: 3 [IU] via SUBCUTANEOUS
  Administered 2018-07-08 – 2018-07-09 (×6): 2 [IU] via SUBCUTANEOUS
  Administered 2018-07-10: 1 [IU] via SUBCUTANEOUS
  Administered 2018-07-10: 2 [IU] via SUBCUTANEOUS
  Administered 2018-07-10 (×3): 1 [IU] via SUBCUTANEOUS

## 2018-07-07 MED ORDER — GUAIFENESIN 100 MG/5ML PO SOLN: 10.0000 mL | ORAL | Status: DC | PRN

## 2018-07-07 MED ORDER — FLEET ENEMA 7-19 GM/118ML RE ENEM
1.0000 | ENEMA | Freq: Every day | RECTAL | Status: DC | PRN
  Filled 2018-07-07: qty 1

## 2018-07-07 MED ORDER — FREE WATER
250.0000 mL | Status: DC
  Administered 2018-07-07 – 2018-07-08 (×8): 250 mL

## 2018-07-07 MED ORDER — ENOXAPARIN SODIUM 40 MG/0.4ML ~~LOC~~ SOLN
40.0000 mg | SUBCUTANEOUS | Status: DC
  Administered 2018-07-07 – 2018-07-10 (×4): 40 mg via SUBCUTANEOUS
  Filled 2018-07-07 (×4): qty 0.4

## 2018-07-07 MED ORDER — GUAIFENESIN ER 600 MG PO TB12
600.0000 mg | ORAL_TABLET | Freq: Two times a day (BID) | ORAL | Status: DC
  Administered 2018-07-07: 600 mg via ORAL
  Filled 2018-07-07 (×2): qty 1

## 2018-07-07 MED ORDER — BACID PO TABS
1.0000 | ORAL_TABLET | Freq: Two times a day (BID) | ORAL | Status: DC
  Administered 2018-07-07 – 2018-07-10 (×7): 1
  Filled 2018-07-07 (×8): qty 1

## 2018-07-07 MED ORDER — SODIUM CHLORIDE 0.45 % IV SOLN
INTRAVENOUS | Status: DC
  Administered 2018-07-07: 13:00:00 via INTRAVENOUS

## 2018-07-07 NOTE — Progress Notes (Signed)
RT NOTE: RT administered flutter to patient for CPT. Patient unable to perform flutter valve effectively. RT will begin chest vest at 12:00. Vitals are stable. RT will continue to monitor.

## 2018-07-07 NOTE — Care Management Obs Status (Signed)
[facility] NOTIFICATION   Patient Details  Name: Sean Collins MRN: [id number] Date of Birth: Apr 20, 1951   Medicare Observation Status Notification Given:  Yes    Carles Collet, RN 07/07/2018, 12:34 PM

## 2018-07-07 NOTE — Progress Notes (Signed)
CSW called and spoke with the patient's brother, Dain Laseter, [phone]. He expressed that he would like his brother to return to [facility] once he is medically stable. The patient is alert and non-verbal.   CSW will continue to follow.   Domenic Schwab, MSW, [place]

## 2018-07-07 NOTE — Progress Notes (Addendum)
Progress Note    Sean Collins  ZDG:[id number] DOB: 1951-03-06  DOA: 07/06/2018 PCP: [name], MD    Brief Narrative:   Chief complaint: Abnormal labs with cough and congestion  Medical records reviewed and are as summarized below:  Sean Collins is an 68 y.o. male of CVA with residual aphasia and left-sided hemiparesis; who presented with reports of abnormal labs, cough, fevers, and found to have new oxygen requirement.  All of history is obtained from review of records as patient is nonverbal.  Assessment/Plan:   Principal Problem:   AKI (acute kidney injury) ([place]) Active Problems:   History of stroke   CAD (coronary artery disease)   Essential hypertension   Major depressive disorder, recurrent episode ([place])   Type 2 diabetes mellitus with diabetic nephropathy, without long-term current use of insulin (H[facility])   Hypernatremia   Dehydration   1.  Acute kidney injury superimposed on chronic kidney disease stage II: Resolving.  At baseline patient's creatinine previously had been around 1.2, but he presented with a creatinine elevated up to 2.15 with BUN 103.  Given the significantly elevated BUN to creatinine ratio suspect prerenal cause of symptoms.  Creatinine has been slowly improving 2.11-> 1.96-> 1.72 with IV fluids and free water.  IV fluids had been stopped overnight. -Monitor intake and output -Restart 0.45% normal saline IV fluids at 100 mL/h, but changed to D5W at 110ml/hr after repeat Sodium 156. -Repeat BMP in a.m. -Avoid nephrotoxins  2.Hypernatremia: Admission serum sodium was 152 in ED, and 155 when corrected for hyperglycemia.  He appears to be hypovolemic. -Restart IVF as seen above - increase volume of free-water feeding tube flushes 235ml every 4 hours.  - follow chemistries closely and adjust fluids as needed  3. Atypical upper respiratory infection:Patient has frequent cough, scattered rhonchi on exam, no apparent distress. CXR  findings suggestive of mild interstitial edema vs atypical infection; overall picture more consistent with infection. Influenza panel and subsequent respiratory virus panel was negative.  Procalcitonin noted to be initially to be 0.34 on 2/29 and currently trending down. -Will follow-up blood cultures -Will follow-up sputum cultures(if able to be obtained) -Continue azithromycin and Rocephin( effective day 3) -Continue nebs -Repeat chest x-ray -Chest physiotherapy -Mucinex  4. Respiratory failure with hypoxia: Unclear if this is acute or chronic.  Patient currently on 2 L of nasal cannula oxygen to maintain O2 saturations.  At baseline patient was reportedly not on oxygen although appears on his home med list. -Wean to room air as tolerated.  5.  Hyperkalemia.  Potassium trending down slowly. Likely related to dehydration and tube feeds. -Continue with IV fluids  6.  Leukocytosis: White blood cell count trending upwards currently patient remains afebrile.  Blood cultures have have not grown anything. -Continue current antibiotic -Recheck CBC in a.m.  7.  History of CVA, status post PEG tube: Patient with residual aphasia, dysphagia, and left-sided hemiparesis.-Elevate head of bed 4 to 5 degrees to decrease risk of aspiration -Continue tube feeds, increase free-water flushes as above  8.  Hypertension: Blood pressures appear to be relatively at goal. -Continue Norvasc and atenolol as tolerated  9.  Diabetes mellitus type 2: Last hemoglobin A1c noted to be 6.6 in June 2019.  Blood sugars elevated into the 200s. -Added Lantus 5 units daily -Check CBG's and use moderate SSI with Novolog while in hospital  10.  Protein calorie malnutrition: Hemoglobin A1c 16.9.  Nutrition consulted and recommending continuing Jevity 1.5 at  65 mL/h Continuous along with 256mL free water every 4 hours to meet hydration requirements once off IV fluids.  Patient previously had been on 125 mL every 4  hours. -continue recommendations as noted above  11.  Depression, anxiety: Stable.  Depakote level was noted to be subtherapeutic. -Continue Seroquel and Depakote  12.  Lactic acidosis: Resolved.  On admission lactic acid elevated at 2.6.  Suspect secondary to dehydration and/or underlying infection.  Lactic acid trended downward with IV fluids and antibiotics.  Body mass index is 26.18 kg/m.   Family Communication/Anticipated D/C date and plan/Code Status   DVT prophylaxis: Lovenox ordered. Code Status: Full Code.  Family Communication: No family present at bedside Disposition Plan: Discharge back to   Medical Consultants:    None.   Anti-Infectives:    Rocephin day 2  Azithromycin day 2  Subjective:   Patient nonverbal  Objective:    Vitals:   07/07/18 0500 07/07/18 0616 07/07/18 0818 07/07/18 0843  BP:  122/82  120/76  Pulse:  73 74 74  Resp:  18 18 16   Temp:  97.6 F (36.4 C)  98.3 F (36.8 C)  TempSrc:  Oral  Oral  SpO2:  99% 96% 99%  Weight: 80.4 kg     Height:        Intake/Output Summary (Last 24 hours) at 07/07/2018 1406 Last data filed at 07/07/2018 1210 Gross per 24 hour  Intake 1973.03 ml  Output 1950 ml  Net 23.03 ml   Filed Weights   07/06/18 0535 07/06/18 2011 07/07/18 0500  Weight: 80.4 kg 80.4 kg 80.4 kg    Exam: Constitutional: ill appearing trying to cough Eyes: PERRL, lids and conjunctivae normal ENMT: Mucous membranes are dry. Posterior pharynx clear of any exudate or lesions.Poor dentition.  Neck: normal, supple, no masses, no thyromegaly. No JVD Respiratory: Bibasilar crackles present. No wheezing. Normal respiratory effort. No accessory muscle use.  Cardiovascular: Regular rate and rhythm, no murmurs / rubs / gallops. No extremity edema. 2+ pedal pulses. No carotid bruits.  Abdomen: no tenderness, no masses palpated. No hepatosplenomegaly. Bowel sounds positive. Peg tube present. Musculoskeletal: no clubbing / cyanosis.  No joint deformity upper and lower extremities. Good ROM, no contractures. Normal muscle tone.  Skin: scrotum excoriations and swelling Neurologic: left sided facial drop. Expressive aphasia. Psychiatric: alert, but unable to speak.     Data Reviewed:   I have personally reviewed following labs and imaging studies:  Labs: Labs show the following:   Basic Metabolic Panel: Recent Labs  Lab 07/06/18 0130 07/06/18 0706 07/06/18 1445 07/07/18 0849  NA 152* 153* 151* 152*  K 5.3* 5.4* 5.4* 5.2*  CL 127* 123* 125* 126*  CO2 18* 21* 21* 19*  GLUCOSE 262* 262* 307* 241*  BUN 103* 103* 100* 89*  CREATININE 2.15* 2.11* 1.96* 1.72*  CALCIUM 9.0 8.8* 8.6* 8.4*   GFR Estimated Creatinine Clearance: 41.7 mL/min (A) (by C-G formula based on SCr of 1.72 mg/dL (H)). Liver Function Tests: No results for input(s): AST, ALT, ALKPHOS, BILITOT, PROT, ALBUMIN in the last 168 hours. No results for input(s): LIPASE, AMYLASE in the last 168 hours. No results for input(s): AMMONIA in the last 168 hours. Coagulation profile No results for input(s): INR, PROTIME in the last 168 hours.  CBC: Recent Labs  Lab 07/06/18 0250 07/07/18 0849  WBC 16.9* 21.2*  NEUTROABS 15.2*  --   HGB 16.2 13.1  HCT 52.6* 43.1  MCV 91.6 93.1  PLT PLATELET CLUMPS NOTED  ON SMEAR, COUNT APPEARS DECREASED PLATELET CLUMPS NOTED ON SMEAR, UNABLE TO ESTIMATE   Cardiac Enzymes: No results for input(s): CKTOTAL, CKMB, CKMBINDEX, TROPONINI in the last 168 hours. BNP (last 3 results) No results for input(s): PROBNP in the last 8760 hours. CBG: Recent Labs  Lab 07/06/18 1208 07/06/18 1617 07/06/18 2010 07/07/18 0838 07/07/18 1206  GLUCAP 304* 266* 222* 219* 122*   D-Dimer: No results for input(s): DDIMER in the last 72 hours. Hgb A1c: No results for input(s): HGBA1C in the last 72 hours. Lipid Profile: No results for input(s): CHOL, HDL, LDLCALC, TRIG, CHOLHDL, LDLDIRECT in the last 72 hours. Thyroid function  studies: No results for input(s): TSH, T4TOTAL, T3FREE, THYROIDAB in the last 72 hours.  Invalid input(s): FREET3 Anemia work up: No results for input(s): VITAMINB12, FOLATE, FERRITIN, TIBC, IRON, RETICCTPCT in the last 72 hours. Sepsis Labs: Recent Labs  Lab 07/06/18 0250  07/06/18 0706 07/06/18 1100 07/06/18 1445 07/06/18 1919 07/06/18 2231 07/07/18 0849  PROCALCITON  --   --   --   --  0.32  --   --  0.24  WBC 16.9*  --   --   --   --   --   --  21.2*  LATICACIDVEN  --    < > 2.6* 2.5*  --  2.2* 1.8  --    < > = values in this interval not displayed.    Microbiology Recent Results (from the past 240 hour(s))  MRSA PCR Screening     Status: Abnormal   Collection Time: 07/06/18  5:17 AM  Result Value Ref Range Status   MRSA by PCR POSITIVE (A) NEGATIVE Final    Comment: CRITICAL RESULT CALLED TO, READ BACK BY AND VERIFIED WITH: RN, K. GINGLER 07/06/2018 @0636  THANEY   Respiratory Panel by PCR     Status: None   Collection Time: 07/06/18  1:42 PM  Result Value Ref Range Status   Adenovirus NOT DETECTED NOT DETECTED Final   Coronavirus 229E NOT DETECTED NOT DETECTED Final    Comment: (NOTE) The Coronavirus on the Respiratory Panel, DOES NOT test for the novel  Coronavirus (2019 nCoV)    Coronavirus HKU1 NOT DETECTED NOT DETECTED Final   Coronavirus NL63 NOT DETECTED NOT DETECTED Final   Coronavirus OC43 NOT DETECTED NOT DETECTED Final   Metapneumovirus NOT DETECTED NOT DETECTED Final   Rhinovirus / Enterovirus NOT DETECTED NOT DETECTED Final   Influenza A NOT DETECTED NOT DETECTED Final   Influenza B NOT DETECTED NOT DETECTED Final   Parainfluenza Virus 1 NOT DETECTED NOT DETECTED Final   Parainfluenza Virus 2 NOT DETECTED NOT DETECTED Final   Parainfluenza Virus 3 NOT DETECTED NOT DETECTED Final   Parainfluenza Virus 4 NOT DETECTED NOT DETECTED Final   Respiratory Syncytial Virus NOT DETECTED NOT DETECTED Final   Bordetella pertussis NOT DETECTED NOT DETECTED Final    Chlamydophila pneumoniae NOT DETECTED NOT DETECTED Final   Mycoplasma pneumoniae NOT DETECTED NOT DETECTED Final    Comment: Performed at [facility], [place]. [address] [place], [place] [address]    Procedures and diagnostic studies:  Dg Chest 2 View  Result Date: 07/07/2018 CLINICAL DATA:  Cough EXAM: CHEST - 2 VIEW COMPARISON:  07/06/2018 FINDINGS: Cardiac shadow is stable. Aortic calcifications are again seen. Increasing bibasilar atelectasis is noted. The overall inspiratory effort is poor. No bony abnormality is seen. IMPRESSION: Bibasilar atelectasis increased from the prior exam. Electronically Signed   By: Linus Mako.D.  On: 07/07/2018 09:43   Dg Chest Port 1 View  Result Date: 07/06/2018 CLINICAL DATA:  Initial evaluation for acute cough, shortness of breath. EXAM: PORTABLE CHEST 1 VIEW COMPARISON:  Prior radiograph from 10/27/2015. FINDINGS: Cardiac and mediastinal silhouettes are stable in size and contour, and remain within normal limits. Lungs are hypoinflated. Diffuse pulmonary interstitial congestion, which could reflect pulmonary interstitial edema and/or atypical infection/bronchiolitis. Superimposed mild bibasilar atelectasis. No consolidative opacity. No pleural effusion. No pneumothorax. No acute osseous finding. IMPRESSION: 1. Diffuse pulmonary interstitial congestion, which could reflect mild diffuse pulmonary interstitial edema or possibly atypical infection/bronchiolitis. No consolidative opacity to suggest pneumonia. 2. Low lung volumes with superimposed mild bibasilar atelectasis. Electronically Signed   By: Jeannine Boga M.D.   On: 07/06/2018 02:19    Medications:   . allopurinol  150 mg Per Tube Daily  . amLODipine  5 mg Per Tube Daily  . aspirin  325 mg Per Tube Daily  . atenolol  50 mg Per Tube BID  . atorvastatin  80 mg Per Tube q1800  . chlorhexidine  15 mL Mouth Rinse BID  . Chlorhexidine Gluconate Cloth  6 each Topical Q0600  . divalproex   125 mg Oral Q12H  . folic acid  1 mg Per Tube Daily  . free water  250 mL Per Tube Q4H  . gabapentin  100 mg Oral QHS  . guaiFENesin  600 mg Oral BID  . insulin aspart  0-15 Units Subcutaneous TID WC  . insulin glargine  5 Units Subcutaneous QHS  . ipratropium-albuterol  3 mL Nebulization TID  . mouth rinse  15 mL Mouth Rinse q12n4p  . mupirocin ointment  1 application Nasal BID  . nystatin   Topical BID  . QUEtiapine  12.5 mg Per Tube Daily   And  . QUEtiapine  25 mg Per Tube QHS  . ranitidine  75 mg Per Tube BID  . sodium chloride flush  3 mL Intravenous Q12H   Continuous Infusions: . sodium chloride 100 mL/hr at 07/07/18 1310  . azithromycin 500 mg (07/07/18 0635)  . cefTRIAXone (ROCEPHIN)  IV 1 g (07/06/18 1745)  . feeding supplement (JEVITY 1.5 CAL/FIBER) 65 mL/hr (07/06/18 2210)     LOS: 0 days   Rondell A Smith  [facility]   *Please refer to [email or web address], password [email or web address] to get updated schedule on who will round on this patient, as hospitalists switch teams weekly. If 7PM-7AM, please contact night-coverage at [email or web address], password [email or web address] for any overnight needs.

## 2018-07-07 NOTE — Progress Notes (Signed)
CSW verified that the patient is from [facility]. CSW spoke with admissions director, Daleen Snook. The facility is able to accept the patient back once he is medically stable.   He may require a new insurance authorization. CSW will meet with the patient to verify that is what he wants as well.   CSW will continue to follow.   Domenic Schwab, MSW, [place]

## 2018-07-08 LAB — GLUCOSE, CAPILLARY
Glucose-Capillary: 171 mg/dL — ABNORMAL HIGH (ref 70–99)
Glucose-Capillary: 195 mg/dL — ABNORMAL HIGH (ref 70–99)
Glucose-Capillary: 188 mg/dL — ABNORMAL HIGH (ref 70–99)
Glucose-Capillary: 184 mg/dL — ABNORMAL HIGH (ref 70–99)
Glucose-Capillary: 182 mg/dL — ABNORMAL HIGH (ref 70–99)
Glucose-Capillary: 203 mg/dL — ABNORMAL HIGH (ref 70–99)

## 2018-07-08 LAB — BASIC METABOLIC PANEL
Anion gap: 7 (ref 5–15)
Anion gap: 5 (ref 5–15)
BUN: 53 mg/dL — AB (ref 8–23)
BUN: 39 mg/dL — ABNORMAL HIGH (ref 8–23)
CO2: 23 mmol/L (ref 22–32)
CO2: 21 mmol/L — ABNORMAL LOW (ref 22–32)
CREATININE: 1.26 mg/dL — AB (ref 0.61–1.24)
Calcium: 8.3 mg/dL — ABNORMAL LOW (ref 8.9–10.3)
Calcium: 8 mg/dL — ABNORMAL LOW (ref 8.9–10.3)
Chloride: 120 mmol/L — ABNORMAL HIGH (ref 98–111)
Chloride: 119 mmol/L — ABNORMAL HIGH (ref 98–111)
Creatinine, Ser: 1.3 mg/dL — ABNORMAL HIGH (ref 0.61–1.24)
GFR calc Af Amer: >60 mL/min (ref >60)
GFR calc Af Amer: >60 mL/min (ref >60)
GFR calc non Af Amer: 56 mL/min — ABNORMAL LOW (ref >60)
GFR calc non Af Amer: 59 mL/min — ABNORMAL LOW (ref >60)
Glucose, Bld: 214 mg/dL — ABNORMAL HIGH (ref 70–99)
Glucose, Bld: 234 mg/dL — ABNORMAL HIGH (ref 70–99)
Potassium: 4.2 mmol/L (ref 3.5–5.1)
Potassium: 4.2 mmol/L (ref 3.5–5.1)
SODIUM: 145 mmol/L (ref 135–145)
Sodium: 150 mmol/L — ABNORMAL HIGH (ref 135–145)

## 2018-07-08 LAB — LEGIONELLA PNEUMOPHILA SEROGP 1 UR AG: L. pneumophila Serogp 1 Ur Ag: NEGATIVE

## 2018-07-08 LAB — CBC WITH DIFFERENTIAL/PLATELET
Abs Immature Granulocytes: 0.12 10*3/uL — ABNORMAL HIGH (ref 0.00–0.07)
Basophils Absolute: 0 10*3/uL (ref 0.0–0.1)
Basophils Relative: 0 %
Eosinophils Absolute: 0.6 10*3/uL — ABNORMAL HIGH (ref 0.0–0.5)
Eosinophils Relative: 5 %
HCT: 46 % (ref 39.0–52.0)
Hemoglobin: 14.6 g/dL (ref 13.0–17.0)
Immature Granulocytes: 1 %
Lymphocytes Relative: 26 %
Lymphs Abs: 2.9 10*3/uL (ref 0.7–4.0)
MCH: 28.9 pg (ref 26.0–34.0)
MCHC: 31.7 g/dL (ref 30.0–36.0)
MCV: 90.9 fL (ref 80.0–100.0)
MONOS PCT: 12 %
Monocytes Absolute: 1.3 10*3/uL — ABNORMAL HIGH (ref 0.1–1.0)
Neutro Abs: 6 10*3/uL (ref 1.7–7.7)
Neutrophils Relative %: 56 %
Platelets: 90 10*3/uL — ABNORMAL LOW (ref 150–400)
RBC: 5.06 MIL/uL (ref 4.22–5.81)
RDW: 16.7 % — ABNORMAL HIGH (ref 11.5–15.5)
WBC: 10.9 10*3/uL — ABNORMAL HIGH (ref 4.0–10.5)
nRBC: 0.6 % — ABNORMAL HIGH (ref 0.0–0.2)

## 2018-07-08 LAB — HEMOGLOBIN A1C
Hgb A1c MFr Bld: 6.8 % — ABNORMAL HIGH (ref 4.8–5.6)
Mean Plasma Glucose: 148.46 mg/dL

## 2018-07-08 LAB — MAGNESIUM: Magnesium: 2.5 mg/dL — ABNORMAL HIGH (ref 1.7–2.4)

## 2018-07-08 MED ORDER — FREE WATER
250.0000 mL | Freq: Four times a day (QID) | Status: DC
  Administered 2018-07-08 – 2018-07-09 (×5): 250 mL

## 2018-07-08 MED ORDER — AZITHROMYCIN 500 MG PO TABS
500.0000 mg | ORAL_TABLET | Freq: Every day | ORAL | Status: AC
  Administered 2018-07-09 – 2018-07-10 (×2): 500 mg
  Filled 2018-07-08 (×3): qty 1

## 2018-07-08 NOTE — Consult Note (Addendum)
[facility] Nurse wound consult note Reason for Consult:Moisture associated skin damage to buttocks and sacrum.  Present on admission.  Partial thickness.  Wound type:MASD Pressure Injury POA: NA Measurement: Scattered 0.2 cm denuded skin to scrotum and gluteal fold. Has condom catheter in place Wound QTT:CNGF and moist Drainage (amount, consistency, odor) weeping Periwound: moist skin Dressing procedure/placement/frequency: Barrier cream twice daily and PRN soilage.  [facility] Nurse ostomy follow up Has feeding tube in LMQ that is dry and intact with dry dressing in place.  Please change daily and monitor for ongoing issue.  Will not follow at this time.  Please re-consult if needed.  Domenic Moras MSN, RN, FNP-BC [facility] Wound, Ostomy, Continence Nurse Pager [phone]

## 2018-07-08 NOTE — Progress Notes (Signed)
PROGRESS NOTE    Sean Collins [name]  ZOX:[id number] DOB: Aug 09, 1950 DOA: 07/06/2018 PCP: [name], MD   Brief Narrative:  Sean Collins is an 68 y.o. male of CVA with residual aphasia and left-sided hemiparesis; who presented with reports of abnormal labs, cough, fevers, and found to have new oxygen requirement.  All of history is obtained from review of records as patient is nonverbal.  Assessment & Plan:   Principal Problem:   AKI (acute kidney injury) (New Sean Collins) Active Problems:   History of stroke   CAD (coronary artery disease)   Essential hypertension   Major depressive disorder, recurrent episode ([place])   Type 2 diabetes mellitus with diabetic nephropathy, without long-term current use of insulin (HCC)   Hypernatremia   Dehydration   1.  Acute kidney injury superimposed on chronic kidney disease stage II: Resolving.  At baseline patient's creatinine previously had been around 1.2, but he presented with Sean Collins creatinine elevated up to 2.15 with BUN 103.  Given the significantly elevated BUN to creatinine ratio suspect prerenal cause of symptoms.  Creatinine has been slowly improving 2.11-> 1.96-> 1.72 -> 1.61 -> 1.3 with IV fluids and free water.  IV fluids had been stopped overnight. - Creatinine improving - Continue D5 and increased free water flushes  - consider further w/u as needed with renal US - Urine sodium with low urine sodium suggesting Na avid state (hypovolemia).  UA had proteinuria, 0-5 RBC's.   - Repeat BMP this afternoon - Avoid nephrotoxins  2.Hypernatremia: Admission serum sodium was 152 in ED, and 155 when corrected for hyperglycemia. Worsened to 156 yesterday on 1/2 NS, but now 150 - Improved after D5 started  - Goal correction rate 5-8 meq/L/day - Repeat BMP this afternoon -increase volume of free-water feeding tube flushes 255ml every 6 hours (125 ml q 4 prior to admission)  -follow chemistries closely and adjust fluids as needed  3.  Atypical upper respiratory infection:Patient has frequent cough, scattered rhonchi on exam, no apparent distress. CXR findings suggestive of mild interstitial edema vs atypical infection; overall picture more consistent with infection. Influenza panel and subsequent respiratory virus panel was negative.  Procalcitonin noted to be initially to be 0.34 -> 0.24.   -Will follow-up blood cultures (NGTD x 2) -Will follow-up sputum cultures(if able to be obtained) -Continueazithromycin and Rocephin (2/28 - present) -Continue nebs -Repeat chest x-ray (bibasilar atelectasis, increased from prior exam) -Chest physiotherapy -Mucinex  4. Respiratory failure with hypoxia: Not on O2 at home per discussion with staff at [facility] to room air as tolerated.  5.  Hyperkalemia.  Potassium trending down slowly. Likely related to dehydration and tube feeds. -Continue with IV fluids  6.  Leukocytosis: improving on abx.  Continue to monitor. Follow fever curve.  7.  History of CVA, status post PEG tube: Patient with residual aphasia, dysphagia, and left-sided hemiparesis. - Aspiration precautions - Continue tube feeds, increase free-water flushes as above - Sounds like he uses white board to communicate at facility, will try to get this to bedside for him   8.  Hypertension: Blood pressures appear to be relatively at goal. -Continue Norvasc and atenolol as tolerated  9.  Diabetes mellitus type 2: Last hemoglobin A1c noted to be 6.6 in June 2019.  Blood sugars elevated into the 200s. -lantus 5 units daily -A1c 6.8 this admission  -Check CBG's and use moderate SSI with Novolog while in hospital  10.  Protein calorie malnutrition:  Nutrition consulted and recommending  continuing Jevity 1.5 at 65 mL/h Continuous along with 267mL free water every 6 hours to meet hydration requirements once off IV fluids.  Patient previously had been on 125 mL every 4 hours. -continue recommendations as  noted above  11.  Depression, anxiety: Stable.  Depakote level was noted to be subtherapeutic. -Continue Seroquel and Depakote  12.  Lactic acidosis: Resolved.  On admission lactic acid elevated at 2.6.  Suspect secondary to dehydration and/or underlying infection.  Lactic acid trended downward with IV fluids and antibiotics.  # Thrombocytopenia: continue to monitor, follow closely  Body mass index is 26.18 kg/m.  DVT prophylaxis: lovenox Code Status: full  Family Communication: brother, POA, over phone.  Asked to bring paperwork.  He confirmed pt's full code status. Disposition Plan: pending further improvement.  Continues to require IVF with persistent hypernatremia.   Consultants:   none  Procedures:  None  Antimicrobials:  Anti-infectives (From admission, onward)   Start     Dose/Rate Route Frequency Ordered Stop   07/09/18 1000  azithromycin (ZITHROMAX) tablet 500 mg     500 mg Per Tube Daily 07/08/18 1053 07/11/18 0959   07/06/18 1730  cefTRIAXone (ROCEPHIN) 1 g in sodium chloride 0.9 % 100 mL IVPB     1 g 200 mL/hr over 30 Minutes Intravenous Every 24 hours 07/06/18 1658     07/06/18 0645  azithromycin (ZITHROMAX) 500 mg in sodium chloride 0.9 % 250 mL IVPB  Status:  Discontinued     500 mg 250 mL/hr over 60 Minutes Intravenous Every 24 hours 07/06/18 0631 07/08/18 1053     Subjective: Nonverbal.  Unable to communicate. Discussed with nursing to get white board to bedside to try this.  Objective: Vitals:   07/08/18 0512 07/08/18 0824 07/08/18 1052 07/08/18 1435  BP: 129/87  104/72   Pulse: 68  71   Resp: 18  20   Temp: 97.7 F (36.5 C)  98.1 F (36.7 C)   TempSrc: Oral  Oral   SpO2: 100% 98% 100% 99%  Weight:      Height:        Intake/Output Summary (Last 24 hours) at 07/08/2018 1440 Last data filed at 07/08/2018 0454 Gross per 24 hour  Intake 1376.87 ml  Output 1250 ml  Net 126.87 ml   Filed Weights   07/07/18 0500 07/07/18 2041 07/08/18 0500    Weight: 80.4 kg 80.5 kg 80.5 kg    Examination:  General exam: Appears calm and comfortable  Respiratory system: Clear to auscultation. Respiratory effort normal. Cardiovascular system: S1 & S2 heard, RRR.  Gastrointestinal system: Abdomen is nondistended, soft and nontender. Gtube. Central nervous system: Non verbal.  L sided weakness. Extremities: no LEE Skin: No rashes, lesions or ulcers Psychiatry: Judgement and insight appear normal. Mood & affect appropriate.     Data Reviewed: I have personally reviewed following labs and imaging studies  CBC: Recent Labs  Lab 07/06/18 0250 07/07/18 0849 07/08/18 0555  WBC 16.9* 21.2* 10.9*  NEUTROABS 15.2*  --  6.0  HGB 16.2 13.1 14.6  HCT 52.6* 43.1 46.0  MCV 91.6 93.1 90.9  PLT PLATELET CLUMPS NOTED ON SMEAR, COUNT APPEARS DECREASED PLATELET CLUMPS NOTED ON SMEAR, UNABLE TO ESTIMATE 90*   Basic Metabolic Panel: Recent Labs  Lab 07/06/18 0706 07/06/18 1445 07/07/18 0849 07/07/18 1631 07/08/18 0555  NA 153* 151* 152* 156* 150*  K 5.4* 5.4* 5.2* 4.7 4.2  CL 123* 125* 126* 126* 120*  CO2 21* 21* 19* 23  23  GLUCOSE 262* 307* 241* 101* 214*  BUN 103* 100* 89* 72* 53*  CREATININE 2.11* 1.96* 1.72* 1.61* 1.30*  CALCIUM 8.8* 8.6* 8.4* 8.7* 8.3*  MG  --   --   --   --  2.5*   GFR: Estimated Creatinine Clearance: 55.1 mL/min (Byrdie Miyazaki) (by C-G formula based on SCr of 1.3 mg/dL (H)). Liver Function Tests: No results for input(s): AST, ALT, ALKPHOS, BILITOT, PROT, ALBUMIN in the last 168 hours. No results for input(s): LIPASE, AMYLASE in the last 168 hours. No results for input(s): AMMONIA in the last 168 hours. Coagulation Profile: No results for input(s): INR, PROTIME in the last 168 hours. Cardiac Enzymes: No results for input(s): CKTOTAL, CKMB, CKMBINDEX, TROPONINI in the last 168 hours. BNP (last 3 results) No results for input(s): PROBNP in the last 8760 hours. HbA1C: Recent Labs    07/08/18 0555  HGBA1C 6.8*    CBG: Recent Labs  Lab 07/07/18 2040 07/08/18 0114 07/08/18 0503 07/08/18 0721 07/08/18 1200  GLUCAP 130* 171* 195* 188* 184*   Lipid Profile: No results for input(s): CHOL, HDL, LDLCALC, TRIG, CHOLHDL, LDLDIRECT in the last 72 hours. Thyroid Function Tests: No results for input(s): TSH, T4TOTAL, FREET4, T3FREE, THYROIDAB in the last 72 hours. Anemia Panel: No results for input(s): VITAMINB12, FOLATE, FERRITIN, TIBC, IRON, RETICCTPCT in the last 72 hours. Sepsis Labs: Recent Labs  Lab 07/06/18 0706 07/06/18 1100 07/06/18 1445 07/06/18 1919 07/06/18 2231 07/07/18 0849  PROCALCITON  --   --  0.32  --   --  0.24  LATICACIDVEN 2.6* 2.5*  --  2.2* 1.8  --     Recent Results (from the past 240 hour(s))  MRSA PCR Screening     Status: Abnormal   Collection Time: 07/06/18  5:17 AM  Result Value Ref Range Status   MRSA by PCR POSITIVE (Jerolyn Flenniken) NEGATIVE Final    Comment: CRITICAL RESULT CALLED TO, READ BACK BY AND VERIFIED WITH: RN, K. GINGLER 07/06/2018 @0636  THANEY   Respiratory Panel by PCR     Status: None   Collection Time: 07/06/18  1:42 PM  Result Value Ref Range Status   Adenovirus NOT DETECTED NOT DETECTED Final   Coronavirus 229E NOT DETECTED NOT DETECTED Final    Comment: (NOTE) The Coronavirus on the Respiratory Panel, DOES NOT test for the novel  Coronavirus (2019 nCoV)    Coronavirus HKU1 NOT DETECTED NOT DETECTED Final   Coronavirus NL63 NOT DETECTED NOT DETECTED Final   Coronavirus OC43 NOT DETECTED NOT DETECTED Final   Metapneumovirus NOT DETECTED NOT DETECTED Final   Rhinovirus / Enterovirus NOT DETECTED NOT DETECTED Final   Influenza Tomaz Janis NOT DETECTED NOT DETECTED Final   Influenza B NOT DETECTED NOT DETECTED Final   Parainfluenza Virus 1 NOT DETECTED NOT DETECTED Final   Parainfluenza Virus 2 NOT DETECTED NOT DETECTED Final   Parainfluenza Virus 3 NOT DETECTED NOT DETECTED Final   Parainfluenza Virus 4 NOT DETECTED NOT DETECTED Final   Respiratory  Syncytial Virus NOT DETECTED NOT DETECTED Final   Bordetella pertussis NOT DETECTED NOT DETECTED Final   Chlamydophila pneumoniae NOT DETECTED NOT DETECTED Final   Mycoplasma pneumoniae NOT DETECTED NOT DETECTED Final    Comment: Performed at [facility], [address] [place], [place] [address]  Culture, blood (routine x 2) Call MD if unable to obtain prior to antibiotics being given     Status: None (Preliminary result)   Collection Time: 07/06/18  2:40 PM  Result  Value Ref Range Status   Specimen Description BLOOD LEFT HAND  Final   Special Requests AEROBIC BOTTLE ONLY Blood Culture adequate volume  Final   Culture NO GROWTH 2 DAYS  Final   Report Status PENDING  Incomplete  Culture, blood (routine x 2) Call MD if unable to obtain prior to antibiotics being given     Status: None (Preliminary result)   Collection Time: 07/06/18  2:45 PM  Result Value Ref Range Status   Specimen Description BLOOD RIGHT ARM  Final   Special Requests AEROBIC BOTTLE ONLY Blood Culture adequate volume  Final   Culture NO GROWTH 2 DAYS  Final   Report Status PENDING  Incomplete         Radiology Studies: Dg Chest 2 View  Result Date: 07/07/2018 CLINICAL DATA:  Cough EXAM: CHEST - 2 VIEW COMPARISON:  07/06/2018 FINDINGS: Cardiac shadow is stable. Aortic calcifications are again seen. Increasing bibasilar atelectasis is noted. The overall inspiratory effort is poor. No bony abnormality is seen. IMPRESSION: Bibasilar atelectasis increased from the prior exam. Electronically Signed   By: Inez Catalina M.D.   On: 07/07/2018 09:43        Scheduled Meds: . allopurinol  150 mg Per Tube Daily  . amLODipine  5 mg Per Tube Daily  . aspirin  325 mg Per Tube Daily  . atenolol  50 mg Per Tube BID  . atorvastatin  80 mg Per Tube q1800  . [START ON 07/09/2018] azithromycin  500 mg Per Tube Daily  . chlorhexidine  15 mL Mouth Rinse BID  . Chlorhexidine Gluconate Cloth  6 each Topical Q0600  . divalproex   125 mg Oral Q12H  . enoxaparin (LOVENOX) injection  40 mg Subcutaneous Q24H  . folic acid  1 mg Per Tube Daily  . free water  250 mL Per Tube Q4H  . gabapentin  100 mg Oral QHS  . insulin aspart  0-9 Units Subcutaneous Q4H  . insulin glargine  5 Units Subcutaneous QHS  . ipratropium-albuterol  3 mL Nebulization TID  . lactobacillus acidophilus  1 tablet Per Tube BID  . mouth rinse  15 mL Mouth Rinse q12n4p  . mupirocin ointment  1 application Nasal BID  . nystatin   Topical BID  . polyvinyl alcohol  1 drop Both Eyes TID  . QUEtiapine  12.5 mg Per Tube Daily   And  . QUEtiapine  25 mg Per Tube QHS  . ranitidine  75 mg Per Tube BID  . sodium chloride flush  3 mL Intravenous Q12H   Continuous Infusions: . cefTRIAXone (ROCEPHIN)  IV 1 g (07/07/18 1726)  . dextrose Stopped (07/08/18 0649)  . feeding supplement (JEVITY 1.5 CAL/FIBER) 65 mL/hr (07/07/18 2152)     LOS: 1 day    Time spent: over 30 min    Fayrene Helper, MD [facility] Pager AMION  If 7PM-7AM, please contact night-coverage [email or web address] Password [email or web address] 07/08/2018, 2:40 PM

## 2018-07-09 LAB — BASIC METABOLIC PANEL
Anion gap: 4 — ABNORMAL LOW (ref 5–15)
Anion gap: 3 — ABNORMAL LOW (ref 5–15)
BUN: 32 mg/dL — ABNORMAL HIGH (ref 8–23)
BUN: 28 mg/dL — ABNORMAL HIGH (ref 8–23)
CO2: 23 mmol/L (ref 22–32)
CO2: 24 mmol/L (ref 22–32)
CREATININE: 1.23 mg/dL (ref 0.61–1.24)
Calcium: 8 mg/dL — ABNORMAL LOW (ref 8.9–10.3)
Calcium: 7.8 mg/dL — ABNORMAL LOW (ref 8.9–10.3)
Chloride: 114 mmol/L — ABNORMAL HIGH (ref 98–111)
Chloride: 107 mmol/L (ref 98–111)
Creatinine, Ser: 1.17 mg/dL (ref 0.61–1.24)
GFR calc Af Amer: >60 mL/min (ref >60)
GFR calc Af Amer: >60 mL/min (ref >60)
GFR calc non Af Amer: >60 mL/min (ref >60)
GFR calc non Af Amer: >60 mL/min (ref >60)
Glucose, Bld: 219 mg/dL — ABNORMAL HIGH (ref 70–99)
Glucose, Bld: 141 mg/dL — ABNORMAL HIGH (ref 70–99)
POTASSIUM: 4 mmol/L (ref 3.5–5.1)
Potassium: 4 mmol/L (ref 3.5–5.1)
Sodium: 141 mmol/L (ref 135–145)
Sodium: 134 mmol/L — ABNORMAL LOW (ref 135–145)

## 2018-07-09 LAB — MAGNESIUM: Magnesium: 2 mg/dL (ref 1.7–2.4)

## 2018-07-09 LAB — CBC
HCT: 38.5 % — ABNORMAL LOW (ref 39.0–52.0)
Hemoglobin: 12.1 g/dL — ABNORMAL LOW (ref 13.0–17.0)
MCH: 28.4 pg (ref 26.0–34.0)
MCHC: 31.4 g/dL (ref 30.0–36.0)
MCV: 90.4 fL (ref 80.0–100.0)
Platelets: 80 10*3/uL — ABNORMAL LOW (ref 150–400)
RBC: 4.26 MIL/uL (ref 4.22–5.81)
RDW: 15.9 % — ABNORMAL HIGH (ref 11.5–15.5)
WBC: 9.8 10*3/uL (ref 4.0–10.5)
nRBC: 0.5 % — ABNORMAL HIGH (ref 0.0–0.2)

## 2018-07-09 LAB — GLUCOSE, CAPILLARY
GLUCOSE-CAPILLARY: 136 mg/dL — AB (ref 70–99)
GLUCOSE-CAPILLARY: 180 mg/dL — AB (ref 70–99)
Glucose-Capillary: 198 mg/dL — ABNORMAL HIGH (ref 70–99)
Glucose-Capillary: 177 mg/dL — ABNORMAL HIGH (ref 70–99)
Glucose-Capillary: 161 mg/dL — ABNORMAL HIGH (ref 70–99)
Glucose-Capillary: 164 mg/dL — ABNORMAL HIGH (ref 70–99)
Glucose-Capillary: 125 mg/dL — ABNORMAL HIGH (ref 70–99)

## 2018-07-09 MED ORDER — FREE WATER
250.0000 mL | Freq: Three times a day (TID) | Status: DC
  Administered 2018-07-10 (×2): 250 mL

## 2018-07-09 MED ORDER — SODIUM CHLORIDE 0.9 % IV SOLN: INTRAVENOUS | Status: DC

## 2018-07-09 NOTE — Progress Notes (Signed)
**Note De-Identified vi Obfusction** PROGRESS NOTE    Sean Collins  WIO:[id number] DOB: February 24, 1951 DOA: 07/06/2018 PCP: Sean Limes, MD   Brief Nrrtive:  Sean Collins is n 68 y.o. mle of CVA with residul phsi nd left-sided hemipresis; who presented with reports of bnorml lbs, cough, fevers, nd found to hve new oxygen requirement.  All of history is obtined from review of records s ptient is nonverbl.  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  Acute kidney injury superimposed on chronic kidney disese stge II: Resolving.  At bseline ptient's cretinine previously hd been round 1.2, but he presented with  cretinine elevted up to 2.15 with BUN 103.  Given the significntly elevted BUN to cretinine rtio suspect prerenl cuse of symptoms.  Cretinine hs been slowly improving 2.11-> 1.96-> 1.72 -> 1.61 -> 1.3 with IV fluids nd free wter.  IV fluids hd been stopped overnight. - Cretinine improving - Stop D5 - Follow with incresed free wter flushes  - consider further w/u s needed with renl [facility] - Urine sodium with low urine sodium suggesting N vid stte (hypovolemi).  UA hd proteinuri, 0-5 RBC's.  Outptient f/u for proteinuri.   - Avoid nephrotoxins  2.Hyperntremi: Admission serum sodium ws 152 in ED, worsened to 156 t pek.  Improved to 141 tody.  - Stop D5 tody nd follow with incresed free wter flushes to see if ble to mintin sodium  - Repet BMP this fternoon -increse volume of free-wter feeding tube flushes 242ml every 6 hours (125 ml q 4 prior to dmission)    3. Atypicl upper respirtory infection:Ptient hs frequent  cough, scttered rhonchi on exm, no pprent distress. CXR findings suggestive of mild interstitil edem vs typicl infection; overll picture more consistent with infection. Influenz pnel nd subsequent respirtory virus pnel ws negtive.  Proclcitonin noted to be initilly to be 0.34 -> 0.24.   -Will follow-up blood cultures (NGTD x 3) -Will follow-up sputum cultures(if ble to be obtined) -Continuezithromycin nd Rocephin (2/28 - present) -Continue nebs -Repet chest x-ry (bibsilr telectsis, incresed from prior exm) -Chest physiotherpy -Mucinex  4. Respirtory filure with hypoxi: Not on O2 t home per discussion with stff t Hunt to room ir s tolerted.  5.  Hyperklemi. improved, follow   6.  Leukocytosis: improving on bx.  Continue to monitor. Follow fever curve.  7.  History of CVA, sttus post PEG tube: Ptient with residul phsi, dysphgi, nd left-sided hemipresis. - Aspirtion precutions - Continue tube feeds, increse free-wter flushes s bove - Sounds like he uses white bord to communicte t fcility, will try to get this to bedside for him   8.  Hypertension: Blood pressures pper to be reltively t gol. -Continue Norvsc nd tenolol s tolerted  9.  Dibetes mellitus type 2: Lst hemoglobin A1c noted to be 6.6 in June 2019.  Blood sugrs elevted into the 200s. -lntus 5 units dily -A1c 6.8 this dmission  -Check CBG's nd use moderte SSI with Novolog while in hospitl  10.  Protein clorie mlnutrition:  Nutrition consulted nd recommending continuing Jevity 1.5 t 65 mL/h Continuous long with 298mL free wter every 6 hours to **Note De-Identified vi Obfusction** meet hydrtion requirements once off IV fluids.  Ptient previously hd been on 125 mL every 4 hours. -continue recommendtions s noted bove  11.  Depression, nxiety: Stble.  Depkote level ws noted to be subtherpeutic. -Continue Seroquel nd Depkote  12.  Lctic  cidosis: Resolved.  On dmission lctic cid elevted t 2.6.  Suspect secondry to dehydrtion nd/or underlying infection.  Lctic cid trended downwrd with IV fluids nd ntibiotics.  # Thrombocytopeni: continue to monitor, follow closely  Body mss index is 26.18 kg/m.  DVT prophylxis: lovenox Code Sttus: full  Fmily Communiction: brother, POA, over phone on 3/2.  Asked to bring pperwork.  He confirmed pt's full code sttus.  Disposition Pln: possible d/c tomorrow if stble on current regimen  Consultnts:   none  Procedures:  None  Antimicrobils:  Anti-infectives (From dmission, onwrd)   Strt     Dose/Rte Route Frequency Ordered Stop   07/09/18 1000  zithromycin (ZITHROMAX) tblet 500 mg     500 mg Per Tube Dily 07/08/18 1053 07/11/18 0959   07/06/18 1730  cefTRIAXone (ROCEPHIN) 1 g in sodium chloride 0.9 % 100 mL IVPB     1 g 200 mL/hr over 30 Minutes Intrvenous Every 24 hours 07/06/18 1658     07/06/18 0645  zithromycin (ZITHROMAX) 500 mg in sodium chloride 0.9 % 250 mL IVPB  Sttus:  Discontinued     500 mg 250 mL/hr over 60 Minutes Intrvenous Every 24 hours 07/06/18 0631 07/08/18 1053     Subjective: Nonverbl Shkes hed yes nd no ppropritely to questions Denies pin  Objective: Vitls:   07/09/18 0722 07/09/18 0900 07/09/18 1402 07/09/18 1635  BP:  112/69  108/62  Pulse:  70  76  Resp:    20  Temp:    98.1 F (36.7 C)  TempSrc:    Skin  SpO2: 98% 98% 98% 98%  Weight:      Height:        Intke/Output Summry (Lst 24 hours) t 07/09/2018 1927 Lst dt filed t 07/09/2018 1457 Gross per 24 hour  Intke 0 ml  Output 700 ml  Net -700 ml   Filed Weights   07/07/18 0500 07/07/18 2041 07/08/18 0500  Weight: 80.4 kg 80.5 kg 80.5 kg    Exmintion:  Generl: No cute distress. Crdiovsculr: Hert sounds show  regulr rte, nd rhythm. Lungs: Cler to usculttion bilterlly Abdomen: Soft, nontender, nondistended.  G  tube. Neurologicl: Non verbl.  L sided wekness. Skin: Wrm nd dry. No rshes or lesions. Extremities: No clubbing or cynosis. No edem.   Dt Reviewed: I hve personlly reviewed following lbs nd imging studies  CBC: Recent Lbs  Lb 07/06/18 0250 07/07/18 0849 07/08/18 0555 07/09/18 0510  WBC 16.9* 21.2* 10.9* 9.8  NEUTROABS 15.2*  --  6.0  --   HGB 16.2 13.1 14.6 12.1*  HCT 52.6* 43.1 46.0 38.5*  MCV 91.6 93.1 90.9 90.4  PLT PLATELET CLUMPS NOTED ON SMEAR, COUNT APPEARS DECREASED PLATELET CLUMPS NOTED ON SMEAR, UNABLE TO ESTIMATE 90* 80*   Bsic Metbolic Pnel: Recent Lbs  Lb 07/07/18 0849 07/07/18 1631 07/08/18 0555 07/08/18 1936 07/09/18 0510  NA 152* 156* 150* 145 141  K 5.2* 4.7 4.2 4.2 4.0  CL 126* 126* 120* 119* 114*  CO2 19* 23 23 21* 23  GLUCOSE 241* 101* 214* 234* 219*  BUN 89* 72* 53* 39* 32*  CREATININE 1.72* 1.61* 1.30* 1.26* 1.23  CALCIUM 8.4* 8.7* 8.3* 8.0* 8.0*  MG  --   --  2.5*  --  2.0   GFR: Estimated Creatinine Clearance: 58.3 mL/min (by C-G formula based on SCr of 1.23 mg/dL). Liver Function Tests: No results for input(s): ST, LT, LKPHOS, BILITOT, PROT, LBUMIN in the last 168 hours. No results for input(s): LIPSE, MYLSE in the last 168 hours. No results for input(s): MMONI in the last 168 hours. Coagulation Profile: No results for input(s): INR, PROTIME in the last 168 hours. Cardiac Enzymes: No results for input(s): CKTOTL, CKMB, CKMBINDEX, TROPONINI in the last 168 hours. BNP (last 3 results) No results for input(s): PROBNP in the last 8760 hours. Hb1C: Recent Labs    07/08/18 0555  HGB1C 6.8*   CBG: Recent Labs  Lab 07/09/18 0132 07/09/18 0536 07/09/18 0746 07/09/18 1427 07/09/18 1609  GLUCP 180* 198* 177* 161* 164*   Lipid Profile: No results for input(s): CHOL, HDL, LDLCLC, TRIG, CHOLHDL, LDLDIRECT in the last 72 hours. Thyroid Function Tests: No results for input(s): TSH, T4TOTL, FREET4,  T3FREE, THYROIDB in the last 72 hours. nemia Panel: No results for input(s): VITMINB12, FOLTE, FERRITIN, TIBC, IRON, RETICCTPCT in the last 72 hours. Sepsis Labs: Recent Labs  Lab 07/06/18 0706 07/06/18 1100 07/06/18 1445 07/06/18 1919 07/06/18 2231 07/07/18 0849  PROCLCITON  --   --  0.32  --   --  0.24  LTICCIDVEN 2.6* 2.5*  --  2.2* 1.8  --     Recent Results (from the past 240 hour(s))  MRS PCR Screening     Status: bnormal   Collection Time: 07/06/18  5:17 M  Result Value Ref Range Status   MRS by PCR POSITIVE () NEGTIVE Final    Comment: CRITICL RESULT CLLED TO, RED BCK BY ND VERIFIED WITH: RN, K. GINGLER 07/06/2018 @0636  THNEY   Respiratory Panel by PCR     Status: None   Collection Time: 07/06/18  1:42 PM  Result Value Ref Range Status   denovirus NOT DETECTED NOT DETECTED Final   Coronavirus 229E NOT DETECTED NOT DETECTED Final    Comment: (NOTE) The Coronavirus on the Respiratory Panel, DOES NOT test for the novel  Coronavirus (2019 nCoV)    Coronavirus HKU1 NOT DETECTED NOT DETECTED Final   Coronavirus NL63 NOT DETECTED NOT DETECTED Final   Coronavirus OC43 NOT DETECTED NOT DETECTED Final   Metapneumovirus NOT DETECTED NOT DETECTED Final   Rhinovirus / Enterovirus NOT DETECTED NOT DETECTED Final   Influenza  NOT DETECTED NOT DETECTED Final   Influenza B NOT DETECTED NOT DETECTED Final   Parainfluenza Virus 1 NOT DETECTED NOT DETECTED Final   Parainfluenza Virus 2 NOT DETECTED NOT DETECTED Final   Parainfluenza Virus 3 NOT DETECTED NOT DETECTED Final   Parainfluenza Virus 4 NOT DETECTED NOT DETECTED Final   Respiratory Syncytial Virus NOT DETECTED NOT DETECTED Final   Bordetella pertussis NOT DETECTED NOT DETECTED Final   Chlamydophila pneumoniae NOT DETECTED NOT DETECTED Final   Mycoplasma pneumoniae NOT DETECTED NOT DETECTED Final    Comment: Performed at [facility], [place]. [address], [place], [place] [address]  Culture, blood  (routine x 2) Call MD if unable to obtain prior to antibiotics being given     Status: None (Preliminary result)   Collection Time: 07/06/18  2:40 PM  Result Value Ref Range Status   Specimen Description BLOOD LEFT HND  Final   Special Requests EROBIC BOTTLE ONLY Blood Culture adequate volume  Final   Culture   Final    NO GROWTH 3 DYS Performed at  [facility], [place] [address], [place], [place] [address]    Report Status PENDING  Incomplete  Culture, blood (routine x 2) Call MD if unable to obtain prior to antibiotics being given     Status: None (Preliminary result)   Collection Time: 07/06/18  2:45 PM  Result Value Ref Range Status   Specimen Description BLOOD RIGHT ARM  Final   Special Requests AEROBIC BOTTLE ONLY Blood Culture adequate volume  Final   Culture   Final    NO GROWTH 3 DAYS Performed at [facility], [place] [address], [place], [place] [address]    Report Status PENDING  Incomplete         Radiology Studies: No results found.      Scheduled Meds: . allopurinol  150 mg Per Tube Daily  . amLODipine  5 mg Per Tube Daily  . aspirin  325 mg Per Tube Daily  . atenolol  50 mg Per Tube BID  . atorvastatin  80 mg Per Tube q1800  . azithromycin  500 mg Per Tube Daily  . chlorhexidine  15 mL Mouth Rinse BID  . Chlorhexidine Gluconate Cloth  6 each Topical Q0600  . divalproex  125 mg Oral Q12H  . enoxaparin (LOVENOX) injection  40 mg Subcutaneous Q24H  . folic acid  1 mg Per Tube Daily  . free water  250 mL Per Tube Q6H  . gabapentin  100 mg Oral QHS  . insulin aspart  0-9 Units Subcutaneous Q4H  . insulin glargine  5 Units Subcutaneous QHS  . ipratropium-albuterol  3 mL Nebulization TID  . lactobacillus acidophilus  1 tablet Per Tube BID  . mouth rinse  15 mL Mouth Rinse q12n4p  . mupirocin ointment  1 application Nasal BID  . nystatin   Topical BID  . polyvinyl alcohol  1 drop Both Eyes TID  . QUEtiapine  12.5 mg Per Tube Daily   And  .  QUEtiapine  25 mg Per Tube QHS  . ranitidine  75 mg Per Tube BID  . sodium chloride flush  3 mL Intravenous Q12H   Continuous Infusions: . cefTRIAXone (ROCEPHIN)  IV 1 g (07/09/18 1636)  . feeding supplement (JEVITY 1.5 CAL/FIBER) 65 mL/hr (07/07/18 2152)     LOS: 2 days    Time spent: over 30 min    Fayrene Helper, MD [facility] Pager AMION  If 7PM-7AM, please contact night-coverage [email or web address] Password [facility] 07/09/2018, 7:27 PM

## 2018-07-10 DIAGNOSIS — G8194 Hemiplegia, unspecified affecting left nondominant side: Secondary | ICD-10-CM

## 2018-07-10 DIAGNOSIS — R4701 Aphasia: Secondary | ICD-10-CM

## 2018-07-10 DIAGNOSIS — J690 Pneumonitis due to inhalation of food and vomit: Secondary | ICD-10-CM

## 2018-07-10 LAB — CBC
HCT: 37.4 % — ABNORMAL LOW (ref 39.0–52.0)
Hemoglobin: 12.1 g/dL — ABNORMAL LOW (ref 13.0–17.0)
MCH: 28.8 pg (ref 26.0–34.0)
MCHC: 32.4 g/dL (ref 30.0–36.0)
MCV: 89 fL (ref 80.0–100.0)
Platelets: 89 10*3/uL — ABNORMAL LOW (ref 150–400)
RBC: 4.2 MIL/uL — AB (ref 4.22–5.81)
RDW: 15.7 % — ABNORMAL HIGH (ref 11.5–15.5)
WBC: 10.3 10*3/uL (ref 4.0–10.5)
nRBC: 0.2 % (ref 0.0–0.2)

## 2018-07-10 LAB — GLUCOSE, CAPILLARY
Glucose-Capillary: 173 mg/dL — ABNORMAL HIGH (ref 70–99)
Glucose-Capillary: 144 mg/dL — ABNORMAL HIGH (ref 70–99)
Glucose-Capillary: 146 mg/dL — ABNORMAL HIGH (ref 70–99)
Glucose-Capillary: 156 mg/dL — ABNORMAL HIGH (ref 70–99)
Glucose-Capillary: 95 mg/dL (ref 70–99)

## 2018-07-10 LAB — MAGNESIUM: Magnesium: 2.1 mg/dL (ref 1.7–2.4)

## 2018-07-10 LAB — BASIC METABOLIC PANEL
Anion gap: 7 (ref 5–15)
BUN: 28 mg/dL — AB (ref 8–23)
CO2: 23 mmol/L (ref 22–32)
Calcium: 8.2 mg/dL — ABNORMAL LOW (ref 8.9–10.3)
Chloride: 111 mmol/L (ref 98–111)
Creatinine, Ser: 1.17 mg/dL (ref 0.61–1.24)
GFR calc Af Amer: >60 mL/min (ref >60)
GFR calc non Af Amer: >60 mL/min (ref >60)
Glucose, Bld: 192 mg/dL — ABNORMAL HIGH (ref 70–99)
POTASSIUM: 4.3 mmol/L (ref 3.5–5.1)
Sodium: 141 mmol/L (ref 135–145)

## 2018-07-10 MED ORDER — FREE WATER: 250.0000 mL | Freq: Three times a day (TID) | 0 refills | Status: DC

## 2018-07-10 MED ORDER — NYSTATIN 100000 UNIT/ML MT SUSP: 5.0000 mL | Freq: Four times a day (QID) | OROMUCOSAL | 0 refills | Status: DC

## 2018-07-10 MED ORDER — GUAIFENESIN 100 MG/5ML PO LIQD: 200.0000 mg | Freq: Four times a day (QID) | ORAL | 0 refills | Status: DC

## 2018-07-10 MED ORDER — HYDROCOD POLST-CPM POLST ER 10-8 MG/5ML PO SUER: 5.0000 mL | Freq: Two times a day (BID) | ORAL | 0 refills | Status: AC

## 2018-07-10 MED ORDER — INSULIN ASPART 100 UNIT/ML ~~LOC~~ SOLN: SUBCUTANEOUS | 0 refills | Status: DC

## 2018-07-10 MED ORDER — IPRATROPIUM-ALBUTEROL 0.5-2.5 (3) MG/3ML IN SOLN: 3.0000 mL | Freq: Four times a day (QID) | RESPIRATORY_TRACT | 0 refills | Status: DC

## 2018-07-10 NOTE — Progress Notes (Addendum)
MD at the pts bedside. Neb tx done. Pt pulled neb tx off twice. Pt unable to tol CPT. RT attempt oral sxn- pt grabbed at yaunker and refused oral sxn. RN at the bedside

## 2018-07-10 NOTE — Progress Notes (Signed)
Nutrition Follow-up  DOCUMENTATION CODES:   Not applicable  INTERVENTION:  - Continue Jevity 1.5 @ 65 ml/hr with 250 ml free water TID via PEG.    NUTRITION DIAGNOSIS:   Inadequate oral intake related to dysphagia as evidenced by NPO status. -ongoing  GOAL:   Patient will meet greater than or equal to 90% of their needs -met with TF regimen  MONITOR:   TF tolerance, Labs, Weight trends, Skin  ASSESSMENT:   68 yo male admitted with AKI with hypernatremia. PMH includesCVA with residual aphasia, left-sided hemiparesis and dysphagia requiring feeding tube, DM, HTN, depression/anxiety.   Weight has been stable throughout hospitalization. Patient is laying in bed at this time and makes eye contact/tracks RD but does not verbalize. No family/visitors present. Patient has PEG in place and is receiving Jevity 1.5 @ 65 ml/hr with 250 ml free water TID. This regimen is providing 2340 kcal, 99 grams of protein (94% estimated protein need), and 1936 ml free water.  Discharge order and summary in from earlier this afternoon. Plan is for patient to d/c to SNF.    Medications reviewed; 1 mg folvite/day, sliding scale novolog, 5 units lantus/day, 1 tablet bacid BID, 75 mg zantac BID.  Labs reviewed; CBGs: 173, 144, and 146 mg/dl today, BUN: 28 mg/dl, Ca: 8.2 mg/dl.    Diet Order:   Diet Order            Diet general              EDUCATION NEEDS:   Not appropriate for education at this time  Skin:  Skin Assessment: Reviewed RN Assessment  Last BM:  3/4  Height:   Ht Readings from Last 1 Encounters:  07/06/18 5' 9"  (1.753 m)    Weight:   Wt Readings from Last 1 Encounters:  07/08/18 80.5 kg    Ideal Body Weight:     BMI:  Body mass index is 26.21 kg/m.  Estimated Nutritional Needs:   Kcal:  2100-2400 kcals   Protein:  105-120 g  Fluid:  >/= 2 L     Jarome Matin, MS, RD, LDN, [facility] Clinical Dietitian Pager # [phone] After hours/weekend  pager # [phone]

## 2018-07-10 NOTE — Clinical Social Work Note (Signed)
Clinical Social Work Assessment  Patient Details  Name: Sean Collins MRN: [id number] Date of Birth: 1951/04/03  Date of referral:  07/06/18               Reason for consult:  Discharge Planning                Permission sought to share information with:  Family Supports Permission granted to share information::  No(Patient non-verbal and oriented to person and place only)  Name::        Agency::     Relationship::     Contact Information:     Housing/Transportation Living arrangements for the past 2 months:  Skilled Nursing Facility([facility]) Source of Information:  Other (Comment Required)(Brother, chart) Patient Interpreter Needed:  None Criminal Activity/Legal Involvement Pertinent to Current Situation/Hospitalization:  No - Comment as needed Significant Relationships:  Siblings Lives with:  Facility Resident(LTC resident at [facility]) Do you feel safe going back to the place where you live?  Yes Need for family participation in patient care:  Yes (Comment)  Care giving concerns:  No concerns expressed by brother regarding care at skilled nursing facility.  Social Worker assessment / plan: CSW Urban Gibson talked with patient's brother Devynn Scheff on 3/1 and per brother: " He expressed that he would like his brother to return to [facility] once he is medically stable. The patient is alert and non-verbal.".   Employment status:  Disabled (Comment on whether or not currently receiving Disability) Insurance information:  Programmer, applications, Medicaid [place] PT Recommendations:  Not assessed at this time Information / Referral to community resources:  Other (Comment Required)(None needed or requested as patient from SNF LTC and will return there at discharge)  Patient/Family's Response to care:  No concerns expressed by brother regarding patient's care during hospitalization,.  Patient/Family's Understanding of and Emotional Response to Diagnosis, Current  Treatment, and Prognosis:  Not discussed.  Emotional Assessment Appearance:  Appears stated age(Looked in on patient but did not engage in conversation as patient non-verbal) Attitude/Demeanor/Rapport:  Unable to Assess Affect (typically observed):  Unable to Assess Orientation:  Oriented to Self, Oriented to Place Alcohol / Substance use:  Tobacco Use, Alcohol Use, Illicit Drugs(Per H&P patient quit smoking and does not drink or use illicit drugs) Psych involvement (Current and /or in the community):  No (Comment)  Discharge Needs  Concerns to be addressed:  Discharge Planning Concerns Readmission within the last 30 days:  No Current discharge risk:  None Barriers to Discharge:  No Barriers Identified   Sable Feil, LCSW 07/10/2018, 4:26 PM

## 2018-07-10 NOTE — NC FL2 (Addendum)
[place] LEVEL OF CARE SCREENING TOOL     IDENTIFICATION  Patient Name: Sean Collins Birthdate: 07/10/1950 Sex: male Admission Date (Current Location): 07/06/2018  [place] and [place] Number:  Kathleen Argue [id number] [place] and Address:  [facility], [place] [address] [place], [place] [address]      Provider Number: [id number]  Attending Physician Name and Address:  Florencia Reasons, MD  Relative Name and Phone Number:  Dariush Mcnellis - brother, [phone]     Current Level of Care: Hospital Recommended Level of Care: Skilled Nursing Facility(From [facility]) Prior Approval Number:    Date Approved/Denied:   PASRR Number:    Discharge Plan: SNF    Current Diagnoses: Patient Active Problem List   Diagnosis Date Noted  . Aphasia   . AKI (acute kidney injury) ([place]) 07/06/2018  . Hypernatremia 07/06/2018  . Dehydration   . Neoplasm of skin of abdomen 04/23/2018  . Elevated serum creatinine 01/22/2018  . Elevated AST (SGOT) 01/22/2018  . Thrombocytopenia ([place]) 01/22/2018  . Renal insufficiency 09/22/2016  . Type 2 diabetes mellitus with diabetic nephropathy, without long-term current use of insulin ([place]) 09/13/2016  . Depressive psychosis ([place]) 09/13/2016  . Cervical myofascial pain syndrome 08/29/2016  . Cervical facet joint syndrome 08/29/2016  . Recurrent falls 07/11/2016  . Major depressive disorder, recurrent episode ([place]) 04/17/2016  . Shoulder pain, left 02/07/2016  . Essential hypertension 12/08/2015  . Late effects of CVA (cerebrovascular accident) 12/08/2015  . Cerebrovascular accident (CVA) due to thrombosis of right middle cerebral artery ([place])   . Slurred speech   . UGIB (upper gastrointestinal bleed)   . Malnutrition of moderate degree 11/04/2015  . Hematemesis without nausea   . Left hemiparesis ([place])   . Dysphagia   . Dysphagia due to old stroke   . Aspiration pneumonia ([place])   . Hyperlipidemia  11/16/2011  . Gout 11/16/2011  . History of stroke 11/16/2011  . CAD (coronary artery disease) 11/16/2011    Orientation RESPIRATION BLADDER Height & Weight     Self, Place  Normal External catheter(Placed 2/29) Weight: 177 lb 7.5 oz (80.5 kg) Height:  5\' 9"  (175.3 cm)  BEHAVIORAL SYMPTOMS/MOOD NEUROLOGICAL BOWEL NUTRITION STATUS      Incontinent Diet  AMBULATORY STATUS COMMUNICATION OF NEEDS Skin strict NPO, all meds/nurtuion per tube  Supervision Does not communicate(Patient has expressive aphasia and does not communicate) Skin abrasions, Other (Comment)(Skin cracking right/left foot, Excoriated bilateral sacrum; MASD posterior scrotum barrier cream)                       Personal Care Assistance Level of Assistance  Bathing, Feeding, Dressing Bathing Assistance: Limited assistance Feeding assistance: Independent Dressing Assistance: Limited assistance     Functional Limitations Info  Sight, Hearing, Speech Sight Info: Adequate Hearing Info: Adequate Speech Info: Impaired    SPECIAL CARE FACTORS FREQUENCY                       Contractures Contractures Info: Not present    Additional Factors Info  Code Status, Allergies Code Status Info: Full Allergies Info: No known allergies           Current Medications (07/10/2018):  This is the current hospital active medication list Current Facility-Administered Medications  Medication Dose Route Frequency Provider Last Rate Last Dose  . acetaminophen (TYLENOL) tablet 650 mg  650 mg Per Tube Q6H PRN Opyd, Ilene Qua, MD  Or  . acetaminophen (TYLENOL) suppository 650 mg  650 mg Rectal Q6H PRN Opyd, Ilene Qua, MD      . albuterol (PROVENTIL) (2.5 MG/3ML) 0.083% nebulizer solution 2.5 mg  2.5 mg Nebulization Q4H PRN Opyd, Ilene Qua, MD      . allopurinol (ZYLOPRIM) tablet 150 mg  150 mg Per Tube Daily Opyd, Ilene Qua, MD   150 mg at 07/10/18 0959  . amLODipine (NORVASC) tablet 5 mg  5 mg Per Tube Daily Opyd,  Ilene Qua, MD   5 mg at 07/10/18 0959  . aspirin tablet 325 mg  325 mg Per Tube Daily Opyd, Ilene Qua, MD   325 mg at 07/10/18 0958  . atenolol (TENORMIN) tablet 50 mg  50 mg Per Tube BID Opyd, Ilene Qua, MD   50 mg at 07/10/18 0958  . atorvastatin (LIPITOR) tablet 80 mg  80 mg Per Tube q1800 Vianne Bulls, MD   80 mg at 07/09/18 1830  . cefTRIAXone (ROCEPHIN) 1 g in sodium chloride 0.9 % 100 mL IVPB  1 g Intravenous Q24H Smith, Rondell A, MD 200 mL/hr at 07/09/18 1636 1 g at 07/09/18 1636  . chlorhexidine (PERIDEX) 0.12 % solution 15 mL  15 mL Mouth Rinse BID Opyd, Ilene Qua, MD   15 mL at 07/10/18 0958  . divalproex (DEPAKOTE SPRINKLE) capsule 125 mg  125 mg Oral Q12H Opyd, Ilene Qua, MD   125 mg at 07/10/18 0959  . enoxaparin (LOVENOX) injection 40 mg  40 mg Subcutaneous Q24H Smith, Rondell A, MD   40 mg at 07/10/18 1604  . feeding supplement (JEVITY 1.5 CAL/FIBER) liquid  65 mL/hr Per Tube Continuous Opyd, Ilene Qua, MD 65 mL/hr at 07/10/18 0507 65 mL/hr at 07/10/18 0507  . folic acid (FOLVITE) tablet 1 mg  1 mg Per Tube Daily Opyd, Ilene Qua, MD   1 mg at 07/10/18 0958  . free water 250 mL  250 mL Per Tube Q8H Elodia Florence., MD   250 mL at 07/10/18 1305  . gabapentin (NEURONTIN) capsule 100 mg  100 mg Oral QHS Opyd, Ilene Qua, MD   100 mg at 07/09/18 2129  . guaiFENesin (ROBITUSSIN) 100 MG/5ML solution 200 mg  10 mL Per Tube Q4H PRN Lovey Newcomer T, NP   200 mg at 07/09/18 1936  . HYDROcodone-acetaminophen (NORCO/VICODIN) 5-325 MG per tablet 1-2 tablet  1-2 tablet Per Tube Q4H PRN Opyd, Ilene Qua, MD   1 tablet at 07/09/18 1937  . insulin aspart (novoLOG) injection 0-9 Units  0-9 Units Subcutaneous Q4H Fuller Plan A, MD   1 Units at 07/10/18 1304  . insulin glargine (LANTUS) injection 5 Units  5 Units Subcutaneous QHS Norval Morton, MD   5 Units at 07/09/18 2135  . ipratropium-albuterol (DUONEB) 0.5-2.5 (3) MG/3ML nebulizer solution 3 mL  3 mL Nebulization TID Opyd, Ilene Qua,  MD   3 mL at 07/10/18 0945  . lactobacillus acidophilus (BACID) tablet 1 tablet  1 tablet Per Tube BID Fuller Plan A, MD   1 tablet at 07/10/18 1000  . MEDLINE mouth rinse  15 mL Mouth Rinse q12n4p Opyd, Ilene Qua, MD   15 mL at 07/10/18 1605  . mupirocin ointment (BACTROBAN) 2 % 1 application  1 application Nasal BID Opyd, Ilene Qua, MD   1 application at 78/58/85 [phone]  . nystatin (MYCOSTATIN/NYSTOP) topical powder   Topical BID Opyd, Ilene Qua, MD      . ondansetron [facility]) tablet 4  mg  4 mg Oral Q6H PRN Opyd, Ilene Qua, MD       Or  . ondansetron (ZOFRAN) injection 4 mg  4 mg Intravenous Q6H PRN Opyd, Ilene Qua, MD      . polyvinyl alcohol (LIQUIFILM TEARS) 1.4 % ophthalmic solution 1 drop  1 drop Both Eyes TID PRN Opyd, Ilene Qua, MD      . polyvinyl alcohol (LIQUIFILM TEARS) 1.4 % ophthalmic solution 1 drop  1 drop Both Eyes TID Fuller Plan A, MD   1 drop at 07/10/18 1606  . QUEtiapine (SEROQUEL) tablet 12.5 mg  12.5 mg Per Tube Daily Opyd, Ilene Qua, MD   12.5 mg at 07/10/18 0959   And  . QUEtiapine (SEROQUEL) tablet 25 mg  25 mg Per Tube QHS Opyd, Ilene Qua, MD   25 mg at 07/09/18 2129  . ranitidine (ZANTAC) 150 MG/10ML syrup 75 mg  75 mg Per Tube BID Opyd, Ilene Qua, MD   75 mg at 07/10/18 1000  . sodium chloride flush (NS) 0.9 % injection 3 mL  3 mL Intravenous Q12H Opyd, Ilene Qua, MD   3 mL at 07/10/18 1031  . sodium phosphate (FLEET) 7-19 GM/118ML enema 1 enema  1 enema Rectal Daily PRN Norval Morton, MD         Discharge Medications: Please see discharge summary for a list of discharge medications.  Relevant Imaging Results:  Relevant Lab Results:   Additional Information    Sharlet Salina, Mila Homer, LCSW

## 2018-07-10 NOTE — Discharge Summary (Addendum)
Discharge Summary  Sean Collins W[facility]:[id number] DOB: 05-21-1950  PCP: [name], MD  Admit date: 07/06/2018 Discharge date: 07/10/2018  Time spent: 48mins, more than 50% time spent on coordination of care.  Recommendations for Outpatient Follow-up:  1. F/u with SNF MD  for hospital discharge follow up, repeat cbc/bmp at follow up. 2. Recommend Palliative care to follow patient at SNF, patient is at risk of aspiration event, atelectasis, pneumonia, he does not handle oral secretion efficiently, he refused nebs, chest PT and oral suction in the hospital. He is at risk of recurrent aspiration pneumonia.  Discharge Diagnoses:  Active Hospital Problems   Diagnosis Date Noted  . AKI (acute kidney injury) ([place]) 07/06/2018  . Hypernatremia 07/06/2018  . Dehydration   . Type 2 diabetes mellitus with diabetic nephropathy, without long-term current use of insulin ([place]) 09/13/2016  . Major depressive disorder, recurrent episode ([place]) 04/17/2016  . Essential hypertension 12/08/2015  . CAD (coronary artery disease) 11/16/2011  . History of stroke 11/16/2011    Resolved Hospital Problems  No resolved problems to display.    Discharge Condition: stable  Diet recommendation: strict NPO, all meds/nurtuion per tube Oral suction prn, aspiration precaution   Filed Weights   07/07/18 0500 07/07/18 2041 07/08/18 0500  Weight: 80.4 kg 80.5 kg 80.5 kg    History of present illness: (per admitting MD Dr Myna Hidalgo) PCP: [name], MD   Patient coming from: SNF   Chief Complaint: Abnormal labs, cough, congestion, fever   HPI: Draylon Mercadel Danziger is a 68 y.o. male with medical history significant for CVA with residual aphasia and hemiparesis, nonverbal at baseline and feeding tube dependent, depression, coronary artery disease, and diabetes mellitus, now presenting from his SNF for evaluation of abnormal labs in the setting of recent cough, fever, and congestion.  He was  recently noted to have fever and cough at his nursing facility.  Per the nursing home documentation, chest x-ray was negative for active disease.  Patient is unable to contribute much to the history due to his clinical condition.  ED Course: Upon arrival to the ED, patient is found to be afebrile, saturating well on room air, and with vitals otherwise normal.  EKG features a sinus rhythm with LVH and repolarization abnormality.  Chest x-ray is notable for diffuse pulmonary interstitial findings that could reflect mild edema or atypical infection.  Chemistry panel is notable for a sodium of 152, potassium 5.3, BUN 103, and creatinine of 2.15, up from an apparent baseline of 1.2.  CBC features a leukocytosis to 16,900 and platelets are clumped.  Hospitalists were asked to admit.   Hospital Course:  Principal Problem:   AKI (acute kidney injury) ([place]) Active Problems:   History of stroke   CAD (coronary artery disease)   Essential hypertension   Major depressive disorder, recurrent episode ([place])   Type 2 diabetes mellitus with diabetic nephropathy, without long-term current use of insulin ([place])   Hypernatremia   Dehydration   Sepsis presented on admission with fever, leukocytosis , lactic acidosis, aki, source of infection likely due to aspiration pneumonia ( patient is noticed not able to clear oral secretion efficiently) -blood culture negative, respiratory viral panel negative, he is treated with rocephin/zitrhomax in the hospital -fever /leukocytosis/lactic acid /aki Resolved -he continue to have risk of aspiration, need  Aspiration precaution, frequent oral suction and chest PT to prevent/reduce atelectasis  -will benefit from palliative care consult, to be arranged at SNF. -Repeat cxr in 3-4 weeks,  wean oxygen as tolerated   Acute kidney injury superimposed on chronic kidney disease stage II:  Cr 2.15 on presentation, cr 1.17 at discharge -SNF MD to continue monitor renal function,  renal dosing meds, may benefit from nephrology follow up if patient /famil decide on aggressive medical treatment  Hypernatremia:Admission serum sodiumwas152 in ED, peaked at  156 He received free water flushes, sodium normalized for the last three days -continue free water flushes    Hyperkalemia. k 5.4 on presentation, normalized  History of CVA, status post PEG tube: Patient with residual aphasia, dysphagia, and left-sided hemiparesis. 0n asa/statin - Aspiration precaution -continue tube feeds and  free-water flushes    Hypertension: Blood pressures appear to be low normal -d/c Norvasc , continue atenolol as tolerated -SNF MD to monitor blood pressure  noninsulin dependent Diabetes mellitus type 2:  Last hemoglobin A1c noted to be 6.6 in June 2019.  Blood sugars elevated into the 200s. Possible due to recent steroids use and stress from acute illness -he received lantus 5 units daily in the hospital -A1c 6.8 this admission  -continue ssi at SNF, may need to change to glucerna tube feeds if blood glucose remain elevated  Protein calorie malnutrition:  Nutrition consulted and recommending continuing Jevity 1.5 at 65 mL/h Free water flush 125 mL every 4 hours. -consider change to glucerna if blood glucose remain elevated  Depression, anxiety: Stable. Depakote level was noted to be subtherapeutic. -Continue [place] Depakote   Thrombocytopenia: possibly due to sepsis, improving, repeat cbc at hospital discharge follow up.  Oral thrush: topical nystatin  Body mass index is 26.18 kg/m.  DVT prophylaxis while in the hospital: lovenox Code Status: full  Disposition Plan: d/c to SNF  Consultants:   none  Procedures:  None    Discharge Exam: BP 107/60 (BP Location: Right Arm)   Pulse 70   Temp 98.8 F (37.1 C) (Oral)   Resp (!) 22   Ht 5\' 9"  (1.753 m)   Wt 80.5 kg   SpO2 99%   BMI 26.21 kg/m   General: nonverbal Cardiovascular:  RRR Respiratory: diminished at basis, no wheezing, no rales, no rhonchi  Discharge Instructions You were cared for by a hospitalist during your hospital stay. If you have any questions about your discharge medications or the care you received while you were in the hospital after you are discharged, you can call the unit and asked to speak with the hospitalist on call if the hospitalist that took care of you is not available. Once you are discharged, your primary care physician will handle any further medical issues. Please note that NO REFILLS for any discharge medications will be authorized once you are discharged, as it is imperative that you return to your primary care physician (or establish a relationship with a primary care physician if you do not have one) for your aftercare needs so that they can reassess your need for medications and monitor your lab values.  Discharge Instructions    Diet general   Complete by:  As directed    Strict NPO, nothing by mouth, prn oral suction for oral secretion, aspiration precaution, continue tube feeds, all meds per tube.   Discharge instructions   Complete by:  As directed    Chest physical therapy if feasible at snf to prevent/decrease atelectasis.   Increase activity slowly   Complete by:  As directed      Allergies as of 07/10/2018   No Known Allergies     Medication  List    STOP taking these medications   acetaminophen 650 MG CR tablet Commonly known as:  TYLENOL   amLODipine 5 MG tablet Commonly known as:  NORVASC   enalapril 20 MG tablet Commonly known as:  VASOTEC   isosorbide dinitrate 30 MG tablet Commonly known as:  ISORDIL   predniSONE 20 MG tablet Commonly known as:  DELTASONE     TAKE these medications   allopurinol 150 mg Tabs tablet Commonly known as:  ZYLOPRIM Place 150 mg into feeding tube daily.   amoxicillin-clavulanate 875-125 MG tablet Commonly known as:  AUGMENTIN Place 1 tablet into feeding tube every 12  (twelve) hours. FOR 7 DAYS   aspirin 325 MG tablet Place 325 mg into feeding tube daily.   atenolol 50 MG tablet Commonly known as:  TENORMIN Place 50 mg into feeding tube See admin instructions. 50 mg per tube every 12 hours and hold if Systolic reading is <024   atorvastatin 80 MG tablet Commonly known as:  LIPITOR Place 80 mg into feeding tube daily.   BIOFREEZE 4 % Gel Generic drug:  Menthol (Topical Analgesic) Apply 1 application topically See admin instructions. Apply to left hand 2 times a day FOR PAIN   bisacodyl 10 MG suppository Commonly known as:  DULCOLAX Place 10 mg rectally daily as needed (for constipation not relieved by Milk of Magnesia).   chlorpheniramine-HYDROcodone 10-8 MG/5ML Suer Commonly known as:  TUSSIONEX PENNKINETIC ER Place 5 mLs into feeding tube every 12 (twelve) hours for 4 days. FOR 4 DAYS   divalproex 125 MG capsule Commonly known as:  DEPAKOTE SPRINKLE 125 mg 2 (two) times daily. PER TUBE   folic acid 1 MG tablet Commonly known as:  FOLVITE Place 1 mg into feeding tube daily.   gabapentin 100 MG capsule Commonly known as:  NEURONTIN 100 mg at bedtime. PER PEG Tube   guaiFENesin 100 MG/5ML liquid Commonly known as:  ROBITUSSIN Place 10 mLs (200 mg total) into feeding tube every 6 (six) hours for 5 days. FOR 5 DAYS   insulin aspart 100 UNIT/ML injection Commonly known as:  novoLOG Before each meal 3 times a day, 140-199 - 2 units, 200-250 - 4 units, 251-299 - 6 units,  300-349 - 8 units,  350 or above 10 units. Insulin PEN if approved, provide syringes and needles if needed.   ipratropium-albuterol 0.5-2.5 (3) MG/3ML Soln Commonly known as:  DUONEB Take 3 mLs by nebulization 4 (four) times daily for 5 days. FOR 5 DAYS   ISOSOURCE 1.5 CAL Liqd Give 65 mL/hr by tube continuous. This will provide 2340 kcal/day and  "125 ml flush every 4 hours & 30 ml with meds"   ISOSOURCE Liqd Place 2 Cans into feeding tube See admin instructions.  Bolus 2 cans of Isosource every shift until feeding tube is available, then resume continuous feedings   magnesium hydroxide 400 MG/5ML suspension Commonly known as:  MILK OF MAGNESIA Place 30 mLs into feeding tube daily as needed for mild constipation.   nystatin 100000 UNIT/ML suspension Commonly known as:  MYCOSTATIN Use as directed 5 mLs (500,000 Units total) in the mouth or throat 4 (four) times daily.   OXYGEN Inhale into the lungs See admin instructions. Keep oxygen at >90% sat   PROBIOTIC ACIDOPHILUS Caps Place 1 capsule into feeding tube 2 (two) times daily. FOR 14 DAYS   RA SALINE ENEMA 19-7 GM/118ML Enem Place 1 enema rectally daily as needed (for constipation not relieved by Dulcolax suppository  and call MD if not relieved by enema).   ranitidine 15 MG/ML syrup Commonly known as:  ZANTAC Place 75 mg into feeding tube every 12 (twelve) hours. Take 5 mL q12h   REFRESH LIQUIGEL 1 % Gel Generic drug:  Carboxymethylcellulose Sodium Place 1 drop into both eyes 3 (three) times daily.   SEROQUEL 25 MG tablet Generic drug:  QUEtiapine Place 12.5-25 mg into feeding tube See admin instructions. Give 12.5 mg per tube in the morning and 25 mg at bedtime   STERILE WATER PO Place 125 mLs into feeding tube every 4 (four) hours.   Vitamin D-3 25 MCG (1000 UT) Caps Place 1,000 Units into feeding tube daily.      No Known Allergies Follow-up Information    [name], MD Follow up in 1 week(s).   Specialty:  Internal Medicine Why:  hospital discharge follow up, repeat cbc/bmp at follow up Contact information: [address] [place]  [address] [phone]        consider palliative care referral at snf Follow up.            The results of significant diagnostics from this hospitalization (including imaging, microbiology, ancillary and laboratory) are listed below for reference.    Significant Diagnostic Studies: Dg Chest 2 View  Result Date:  07/07/2018 CLINICAL DATA:  Cough EXAM: CHEST - 2 VIEW COMPARISON:  07/06/2018 FINDINGS: Cardiac shadow is stable. Aortic calcifications are again seen. Increasing bibasilar atelectasis is noted. The overall inspiratory effort is poor. No bony abnormality is seen. IMPRESSION: Bibasilar atelectasis increased from the prior exam. Electronically Signed   By: Inez Catalina M.D.   On: 07/07/2018 09:43   Dg Chest Port 1 View  Result Date: 07/06/2018 CLINICAL DATA:  Initial evaluation for acute cough, shortness of breath. EXAM: PORTABLE CHEST 1 VIEW COMPARISON:  Prior radiograph from 10/27/2015. FINDINGS: Cardiac and mediastinal silhouettes are stable in size and contour, and remain within normal limits. Lungs are hypoinflated. Diffuse pulmonary interstitial congestion, which could reflect pulmonary interstitial edema and/or atypical infection/bronchiolitis. Superimposed mild bibasilar atelectasis. No consolidative opacity. No pleural effusion. No pneumothorax. No acute osseous finding. IMPRESSION: 1. Diffuse pulmonary interstitial congestion, which could reflect mild diffuse pulmonary interstitial edema or possibly atypical infection/bronchiolitis. No consolidative opacity to suggest pneumonia. 2. Low lung volumes with superimposed mild bibasilar atelectasis. Electronically Signed   By: Jeannine Boga M.D.   On: 07/06/2018 02:19    Microbiology: Recent Results (from the past 240 hour(s))  MRSA PCR Screening     Status: Abnormal   Collection Time: 07/06/18  5:17 AM  Result Value Ref Range Status   MRSA by PCR POSITIVE (A) NEGATIVE Final    Comment: CRITICAL RESULT CALLED TO, READ BACK BY AND VERIFIED WITH: RN, K. GINGLER 07/06/2018 @0636  THANEY   Respiratory Panel by PCR     Status: None   Collection Time: 07/06/18  1:42 PM  Result Value Ref Range Status   Adenovirus NOT DETECTED NOT DETECTED Final   Coronavirus 229E NOT DETECTED NOT DETECTED Final    Comment: (NOTE) The Coronavirus on the  Respiratory Panel, DOES NOT test for the novel  Coronavirus (2019 nCoV)    Coronavirus HKU1 NOT DETECTED NOT DETECTED Final   Coronavirus NL63 NOT DETECTED NOT DETECTED Final   Coronavirus OC43 NOT DETECTED NOT DETECTED Final   Metapneumovirus NOT DETECTED NOT DETECTED Final   Rhinovirus / Enterovirus NOT DETECTED NOT DETECTED Final   Influenza A NOT DETECTED NOT DETECTED Final  Influenza B NOT DETECTED NOT DETECTED Final   Parainfluenza Virus 1 NOT DETECTED NOT DETECTED Final   Parainfluenza Virus 2 NOT DETECTED NOT DETECTED Final   Parainfluenza Virus 3 NOT DETECTED NOT DETECTED Final   Parainfluenza Virus 4 NOT DETECTED NOT DETECTED Final   Respiratory Syncytial Virus NOT DETECTED NOT DETECTED Final   Bordetella pertussis NOT DETECTED NOT DETECTED Final   Chlamydophila pneumoniae NOT DETECTED NOT DETECTED Final   Mycoplasma pneumoniae NOT DETECTED NOT DETECTED Final    Comment: Performed at [facility], [place] [address] [place], [place] [address]  Culture, blood (routine x 2) Call MD if unable to obtain prior to antibiotics being given     Status: None (Preliminary result)   Collection Time: 07/06/18  2:40 PM  Result Value Ref Range Status   Specimen Description BLOOD LEFT HAND  Final   Special Requests AEROBIC BOTTLE ONLY Blood Culture adequate volume  Final   Culture   Final    NO GROWTH 4 DAYS Performed at [facility], [place] [address], [place], [place] [address]    Report Status PENDING  Incomplete  Culture, blood (routine x 2) Call MD if unable to obtain prior to antibiotics being given     Status: None (Preliminary result)   Collection Time: 07/06/18  2:45 PM  Result Value Ref Range Status   Specimen Description BLOOD RIGHT ARM  Final   Special Requests AEROBIC BOTTLE ONLY Blood Culture adequate volume  Final   Culture   Final    NO GROWTH 4 DAYS Performed at [facility], [place] [address] [place], [place] [address]    Report Status PENDING  Incomplete      Labs: Basic Metabolic Panel: Recent Labs  Lab 07/08/18 0555 07/08/18 1936 07/09/18 0510 07/09/18 2042 07/10/18 0540  NA 150* 145 141 134* 141  K 4.2 4.2 4.0 4.0 4.3  CL 120* 119* 114* 107 111  CO2 23 21* 23 24 23   GLUCOSE 214* 234* 219* 141* 192*  BUN 53* 39* 32* 28* 28*  CREATININE 1.30* 1.26* 1.23 1.17 1.17  CALCIUM 8.3* 8.0* 8.0* 7.8* 8.2*  MG 2.5*  --  2.0  --  2.1   Liver Function Tests: No results for input(s): AST, ALT, ALKPHOS, BILITOT, PROT, ALBUMIN in the last 168 hours. No results for input(s): LIPASE, AMYLASE in the last 168 hours. No results for input(s): AMMONIA in the last 168 hours. CBC: Recent Labs  Lab 07/06/18 0250 07/07/18 0849 07/08/18 0555 07/09/18 0510 07/10/18 0540  WBC 16.9* 21.2* 10.9* 9.8 10.3  NEUTROABS 15.2*  --  6.0  --   --   HGB 16.2 13.1 14.6 12.1* 12.1*  HCT 52.6* 43.1 46.0 38.5* 37.4*  MCV 91.6 93.1 90.9 90.4 89.0  PLT PLATELET CLUMPS NOTED ON SMEAR, COUNT APPEARS DECREASED PLATELET CLUMPS NOTED ON SMEAR, UNABLE TO ESTIMATE 90* 80* 89*   Cardiac Enzymes: No results for input(s): CKTOTAL, CKMB, CKMBINDEX, TROPONINI in the last 168 hours. BNP: BNP (last 3 results) Recent Labs    07/06/18 0424  BNP 259.7*    ProBNP (last 3 results) No results for input(s): PROBNP in the last 8760 hours.  CBG: Recent Labs  Lab 07/09/18 2134 07/09/18 2331 07/10/18 0438 07/10/18 0729 07/10/18 1134  GLUCAP 125* 136* 173* 144* 146*       Signed:  Florencia Reasons MD, PhD  [facility] 07/10/2018, 1:25 PM

## 2018-07-10 NOTE — Progress Notes (Signed)
[facility] here to take patient to [facility]. Alert and reponsive. Medications for tonight given. Patient is incontinent of bowels and bladder. Kept dry and comfortable.

## 2018-07-10 NOTE — Clinical Social Work Note (Signed)
Patient medically stable for discharge and will return to [facility], where he is a LTC resident. Admissions director at [facility] contacted and advised of patient's readiness for discharge and d/c clinicals transmitted to facility. Attempted to reach patient's brother, Sean Collins [phone]) and message left. [name] will be transported back to facility by [facility].   Hailynn Slovacek Givens, MSW, LCSW Licensed Clinical Social Worker [place] [phone]

## 2018-07-10 NOTE — Progress Notes (Signed)
Sean Collins to be D/C'd Skilled nursing facility per MD order.  Discussed prescriptions and follow up appointments with the patient. Prescriptions given to patient, medication list explained in detail. Pt verbalized understanding.  Allergies as of 07/10/2018   No Known Allergies     Medication List    STOP taking these medications   acetaminophen 650 MG CR tablet Commonly known as:  TYLENOL   amLODipine 5 MG tablet Commonly known as:  NORVASC   enalapril 20 MG tablet Commonly known as:  VASOTEC   isosorbide dinitrate 30 MG tablet Commonly known as:  ISORDIL   predniSONE 20 MG tablet Commonly known as:  DELTASONE     TAKE these medications   allopurinol 150 mg Tabs tablet Commonly known as:  ZYLOPRIM Place 150 mg into feeding tube daily.   amoxicillin-clavulanate 875-125 MG tablet Commonly known as:  AUGMENTIN Place 1 tablet into feeding tube every 12 (twelve) hours. FOR 7 DAYS   aspirin 325 MG tablet Place 325 mg into feeding tube daily.   atenolol 50 MG tablet Commonly known as:  TENORMIN Place 50 mg into feeding tube See admin instructions. 50 mg per tube every 12 hours and hold if Systolic reading is <433   atorvastatin 80 MG tablet Commonly known as:  LIPITOR Place 80 mg into feeding tube daily.   BIOFREEZE 4 % Gel Generic drug:  Menthol (Topical Analgesic) Apply 1 application topically See admin instructions. Apply to left hand 2 times a day FOR PAIN   bisacodyl 10 MG suppository Commonly known as:  DULCOLAX Place 10 mg rectally daily as needed (for constipation not relieved by Milk of Magnesia).   chlorpheniramine-HYDROcodone 10-8 MG/5ML Suer Commonly known as:  TUSSIONEX PENNKINETIC ER Place 5 mLs into feeding tube every 12 (twelve) hours for 4 days. FOR 4 DAYS   divalproex 125 MG capsule Commonly known as:  DEPAKOTE SPRINKLE 125 mg 2 (two) times daily. PER TUBE   folic acid 1 MG tablet Commonly known as:  FOLVITE Place 1 mg into feeding  tube daily.   gabapentin 100 MG capsule Commonly known as:  NEURONTIN 100 mg at bedtime. PER PEG Tube   guaiFENesin 100 MG/5ML liquid Commonly known as:  ROBITUSSIN Place 10 mLs (200 mg total) into feeding tube every 6 (six) hours for 5 days. FOR 5 DAYS   insulin aspart 100 UNIT/ML injection Commonly known as:  novoLOG Before each meal 3 times a day, 140-199 - 2 units, 200-250 - 4 units, 251-299 - 6 units,  300-349 - 8 units,  350 or above 10 units. Insulin PEN if approved, provide syringes and needles if needed.   ipratropium-albuterol 0.5-2.5 (3) MG/3ML Soln Commonly known as:  DUONEB Take 3 mLs by nebulization 4 (four) times daily for 5 days. FOR 5 DAYS   ISOSOURCE 1.5 CAL Liqd Give 65 mL/hr by tube continuous. This will provide 2340 kcal/day and  "125 ml flush every 4 hours & 30 ml with meds"   ISOSOURCE Liqd Place 2 Cans into feeding tube See admin instructions. Bolus 2 cans of Isosource every shift until feeding tube is available, then resume continuous feedings   magnesium hydroxide 400 MG/5ML suspension Commonly known as:  MILK OF MAGNESIA Place 30 mLs into feeding tube daily as needed for mild constipation.   nystatin 100000 UNIT/ML suspension Commonly known as:  MYCOSTATIN Use as directed 5 mLs (500,000 Units total) in the mouth or throat 4 (four) times daily.   OXYGEN Inhale into the lungs  See admin instructions. Keep oxygen at >90% sat   PROBIOTIC ACIDOPHILUS Caps Place 1 capsule into feeding tube 2 (two) times daily. FOR 14 DAYS   RA SALINE ENEMA 19-7 GM/118ML Enem Place 1 enema rectally daily as needed (for constipation not relieved by Dulcolax suppository and call MD if not relieved by enema).   ranitidine 15 MG/ML syrup Commonly known as:  ZANTAC Place 75 mg into feeding tube every 12 (twelve) hours. Take 5 mL q12h   REFRESH LIQUIGEL 1 % Gel Generic drug:  Carboxymethylcellulose Sodium Place 1 drop into both eyes 3 (three) times daily.   SEROQUEL 25  MG tablet Generic drug:  QUEtiapine Place 12.5-25 mg into feeding tube See admin instructions. Give 12.5 mg per tube in the morning and 25 mg at bedtime   STERILE WATER PO Place 125 mLs into feeding tube every 4 (four) hours.   Vitamin D-3 25 MCG (1000 UT) Caps Place 1,000 Units into feeding tube daily.       Vitals:   07/10/18 0945 07/10/18 1618  BP:  (!) 106/53  Pulse:  66  Resp:  20  Temp:  98.8 F (37.1 C)  SpO2: 99% 97%    Skin clean, dry and intact without evidence of skin break down, no evidence of skin tears noted. IV catheter discontinued intact. Site without signs and symptoms of complications. Dressing and pressure applied. Pt denies pain at this time. No complaints noted.  An After Visit Summary was printed and given to the patient. Patient picked up for transport to [facility].Marland Kitchen

## 2018-07-11 ENCOUNTER — Non-Acute Institutional Stay (SKILLED_NURSING_FACILITY): Payer: Medicare Other | Admitting: Internal Medicine

## 2018-07-11 ENCOUNTER — Encounter: Payer: Self-pay | Admitting: Internal Medicine

## 2018-07-11 DIAGNOSIS — N179 Acute kidney failure, unspecified: Secondary | ICD-10-CM

## 2018-07-11 DIAGNOSIS — J690 Pneumonitis due to inhalation of food and vomit: Secondary | ICD-10-CM

## 2018-07-11 DIAGNOSIS — R1310 Dysphagia, unspecified: Secondary | ICD-10-CM | POA: Diagnosis not present

## 2018-07-11 DIAGNOSIS — E1121 Type 2 diabetes mellitus with diabetic nephropathy: Secondary | ICD-10-CM | POA: Diagnosis not present

## 2018-07-11 DIAGNOSIS — I6990 Unspecified sequelae of unspecified cerebrovascular disease: Secondary | ICD-10-CM

## 2018-07-11 DIAGNOSIS — R509 Fever, unspecified: Secondary | ICD-10-CM

## 2018-07-11 LAB — CULTURE, BLOOD (ROUTINE X 2)
CULTURE: NO GROWTH
Culture: NO GROWTH
Special Requests: ADEQUATE
Special Requests: ADEQUATE

## 2018-07-11 NOTE — Assessment & Plan Note (Signed)
With free water flushes creatinine was 1.17 at discharge

## 2018-07-11 NOTE — Progress Notes (Signed)
NURSING HOME LOCATION:  [facility] ROOM NUMBER: 130/A   CODE STATUS:  Full Code  PCP:  [name] MD.  This is a nursing facility follow up for [place] readmission within 30 days  Interim medical record and care since last [place] visit was updated with review of diagnostic studies and change in clinical status since last visit were documented.  HPI: Patient was hospitalized 2/29-07/10/2018, admitted from the SNF for possible aspiration pneumonia with associated cough, congestion, and fever.  Imaging here @ [facility] had revealed interstitial markings increase but no definite infiltrate.  Interstitial marking increase was also documented on the ER chest x-ray as well.  Sodium was 152, potassium 5.3, BUN 103, and creatinine 2.15 in the context of a baseline creatinine of 1.2.  Leukocytosis was present with a white count of 16,900.  Blood cultures were negative as well as respiratory viral panel.  He received Rocephin/Zithromax while hospitalized.  There was resolution of the fever, leukocytosis, lactic acidosis, and AKI.  Specifically creatinine dropped to 1.17 at discharge.  Hypernatremia peaked at 156 but responded to free water flushes with normalization of the sodium.  Hyperkalemia also resolved. Aspiration precautions were felt to be necessary as he could not control oral secretions in the context of his history of CVA complicated by a aphasia, left-sided hemiparesis, and dysphagia.  Glucoses were elevated into the 200s, attributed to recent steroid administration.  He was on low-dose Lantus while hospitalized.  A1c was 6.8% indicating adequate diabetic control.  Consideration was to be given to Glucerna tube feedings if glucoses remained elevated.  Fasting blood glucose today is 138.  Palliative care consultation was recommended at the SNF.  Review of systems: History could not be taken due to nonverbal state.  He exhibited unintelligible grunts and groans.  Staff reported no new changes in behavior or findings.   Physical exam:  Pertinent or positive findings: He was initially sleeping but did awaken during the exam.  He keeps a towel in his mouth at the right corner.  Teeth are coated with poor hygiene.  Heart sounds are distant; brief systolic murmur is present.  Bilateral low-grade expiratory rales are noted.  PEG is present.  Pedal pulses are decreased.  He has interosseous wasting of the hands.  There is severe contractures of the right hand.  The left upper extremity and left lower extremity are limp and drop to the bed when lifted.  He has some movement of the right lower extremity and right upper extremity.  Dressings are noted over the right biceps and the left shin.  Pedal pulses are not palpable.  General appearance:  no acute distress, increased work of breathing is present.   Lymphatic: No lymphadenopathy about the head, neck, axilla. Eyes: No conjunctival inflammation or lid edema is present. There is no scleral icterus. Ears:  External ear exam shows no significant lesions or deformities.   Nose:  External nasal examination shows no deformity or inflammation. Nasal mucosa are pink and moist without lesions, exudates Oral exam:  Lips and gums are healthy appearing. There is no oropharyngeal erythema or exudate. Neck:  No thyromegaly, masses, tenderness noted.    Heart:  Normal rate and regular rhythm. S1 and S2 normal without gallop, click, rub .  Lungs:  without wheezes, rales, rubs. Abdomen: Bowel sounds are normal. Abdomen is soft and nontender with no organomegaly, hernias, masses. GU: Deferred  Extremities:  No cyanosis, clubbing, edema  Neurologic exam : Balance, Rhomberg, finger  to nose testing could not be completed due to clinical state Skin: Warm & dry w/o tenting. No significant lesions or rash.  See summary under each active problem in the Problem List with associated updated therapeutic plan

## 2018-07-11 NOTE — Assessment & Plan Note (Addendum)
Hyperglycemia withglucoses into the 200s most likely related to steroids administered prior to admission 2/29-07/10/2018 Low-dose Lantus while hospitalized; A1c 6.8% indicating adequate control. FBS 138 at SNF  Doubt insulin will be necessary once the impact of the steroid burst resolves completely.

## 2018-07-11 NOTE — Assessment & Plan Note (Signed)
Continue tube feedings Changed to Glucerna if hyperglycemia persists or progresses Speech therapy assessment

## 2018-07-11 NOTE — Assessment & Plan Note (Signed)
Speech therapy consultation at [facility]

## 2018-07-11 NOTE — Patient Instructions (Signed)
See assessment and plan under each diagnosis in the problem list and acutely for this visit 

## 2018-07-12 ENCOUNTER — Telehealth: Payer: Self-pay | Admitting: Adult Health Nurse Practitioner

## 2018-07-12 ENCOUNTER — Non-Acute Institutional Stay: Payer: Medicare Other | Admitting: Adult Health Nurse Practitioner

## 2018-07-12 DIAGNOSIS — Z515 Encounter for palliative care: Secondary | ICD-10-CM

## 2018-07-12 NOTE — Telephone Encounter (Signed)
Tried calling patient's brother to discuss goals of care.  Left message on VM with name and number and reason for call Beldon Nowling K. Olena Heckle NP

## 2018-07-12 NOTE — Telephone Encounter (Signed)
Brother returned call and we discussed patient's status and how palliative care can help.  Stated he wanted to set up a meeting with the facility next week and invited me to come so we can continue our discussion.  He will call me back with a date and time Sean Collins K. Olena Heckle NP

## 2018-07-12 NOTE — Progress Notes (Signed)
[place] Consult Note Telephone: [phone]  Fax: [phone] 07/12/2018  PATIENT NAME: Sean Collins DOB: 1950/11/20 MRN: [id number]  PRIMARY CARE PROVIDER:   [name], MD  REFERRING PROVIDER:  [name], MD [address] [place], [place] [address]  RESPONSIBLE PARTY:   Coden Franchi (brother) [phone]     RECOMMENDATIONS and PLAN:  1. Aspiration Pneumonia.  Patient had recent hospitalization for sepsis related to aspiration pneumonia. Patient has problems with secretions.  Patient currently has towel around neck and in his mouth to catch secretions.  He is currently on hydrocodone/chlorpheniramine 10/8 mg per 2ml Q12 hrs for 4 days and quaifenesin 100mg /60ml give 5 ml Q12 hrs for 4 days for cough.  Patient is currently on ranitidine 5mg  Q12 hrs which could help with reflux that could lead to aspiration.  Recommendations that could help with excess secretions include; Scopolamine transdermal 1mg  patch changed every 3 days; antihistamine such as zyrtec or claritin.  If trying any of these would have to monitor for increased dryness to prevent dehydration.  2. Goals of care.  Patient is currently full code. Spoke with patient's brother on the phone and  He did not express wanting to change this.  Is in the process of getting an appointment at the facility and has invited me to join them.  Hope to continue goals of care discussion at this meeting.    I spent 45 minutes providing this consultation,  From 10:00 to 10:45. More than 50% of the time in this consultation was spent coordinating communication.   HISTORY OF PRESENT ILLNESS:  Sean Collins is a 68 y.o. year old male with multiple medical problems including aphasia and  right sided hemiparesis post CVA, HTN, HLD, DMT2, renal insufficiency. Palliative Care was asked to help address goals of care. Patient is nonverbal and is NPO due to aphasia post CVA.  Patient  has PEG tube in which he gets continuous feeding and his meds are given through the PEG tube.   CODE STATUS: Full code  PPS: 20% HOSPICE ELIGIBILITY/DIAGNOSIS: TBD  PHYSICAL EXAM:   General: patient is lying in bed in NAD Cardiovascular: regular rate and rhythm Pulmonary: rhonchi heard but cleared with cough Abdomen: soft, nontender, + bowel sounds; PEG tube intact GU: no suprapubic tenderness Extremities: no edema, contractures of right hand Skin: no rashes; no wounds per staff report Neurological: weakness. Right sided hemiparesis, aphasia, nonverbl post CVA.  Patient responds to his name  PAST MEDICAL HISTORY:  Past Medical History:  Diagnosis Date  . Diabetes mellitus without complication ([place])   . Dysarthria   . Dysphagia   . GI bleed   . Hyperlipidemia   . Hypertension   . Renal insufficiency 09/22/2016  . Stroke [facility])     SOCIAL HX:  Social History   Tobacco Use  . Smoking status: Former Smoker    Packs/day: 0.50    Years: 32.00    Pack years: 16.00    Types: Cigarettes  . Smokeless tobacco: Never Used  . Tobacco comment: Started at age 40 though quit for 10 years at one point  Substance Use Topics  . Alcohol use: No    Alcohol/week: 0.0 standard drinks    ALLERGIES: No Known Allergies   PERTINENT MEDICATIONS:  Outpatient Encounter Medications as of 07/12/2018  Medication Sig  . allopurinol (ZYLOPRIM) 150 mg TABS tablet Place 150 mg into feeding tube daily.  Marland Kitchen amoxicillin-clavulanate (AUGMENTIN) 875-125 MG  tablet Take 1 tablet by mouth every 12 (twelve) hours. For 7 days  . aspirin 325 MG tablet Place 325 mg into feeding tube daily.  Marland Kitchen atenolol (TENORMIN) 50 MG tablet Place 50 mg into feeding tube See admin instructions. 50 mg per tube every 12 hours and hold if Systolic reading is <332  . atorvastatin (LIPITOR) 80 MG tablet Place 80 mg into feeding tube daily.   . chlorpheniramine-HYDROcodone (TUSSIONEX PENNKINETIC ER) 10-8 MG/5ML SUER Place 5 mLs into  feeding tube every 12 (twelve) hours for 4 days. FOR 4 DAYS  . Cholecalciferol (VITAMIN D-3) 25 MCG (1000 UT) CAPS Place 1,000 Units into feeding tube daily.  . divalproex (DEPAKOTE SPRINKLE) 125 MG capsule 125 mg 2 (two) times daily. PER TUBE  . folic acid (FOLVITE) 1 MG tablet Place 1 mg into feeding tube daily.   Marland Kitchen gabapentin (NEURONTIN) 100 MG capsule 100 mg at bedtime. PER PEG Tube  . guaifenesin (TUSSIN) 100 MG/5ML syrup TUSSIONEX PENNKINETIC SUSP ADMINISTER 5ML PER G-TUBE EVERY 12 HOURS X4 DAYS  . insulin aspart (NOVOLOG) 100 UNIT/ML injection Before each meal 3 times a day, 140-199 - 2 units, 200-250 - 4 units, 251-299 - 6 units,  300-349 - 8 units,  350 or above 10 units. Insulin PEN if approved, provide syringes and needles if needed.  . Lactobacillus (PROBIOTIC ACIDOPHILUS) TABS PROBIOTIC & ACIDOPHILUS CAP GIVE 1 CAPSULE TWICE DAILY PER G-TUBE FOR 14DAYS  . magnesium hydroxide (MILK OF MAGNESIA) 400 MG/5ML suspension Place 30 mLs into feeding tube daily as needed for mild constipation.  . Menthol, Topical Analgesic, (BIOFREEZE) 4 % GEL Apply 1 application topically See admin instructions. Apply to left hand 2 times a day FOR PAIN  . Nutritional Supplements (ISOSOURCE 1.5 CAL) LIQD Give 65 mL/hr by tube continuous. This will provide 2340 kcal/day and  "125 ml flush every 4 hours & 30 ml with meds"  . Nutritional Supplements (ISOSOURCE) LIQD Place 2 Cans into feeding tube See admin instructions. Bolus 2 cans of Isosource every shift until feeding tube is available, then resume continuous feedings  . OXYGEN Inhale into the lungs See admin instructions. Keep oxygen at >90% sat  . QUEtiapine (SEROQUEL) 25 MG tablet Place 25 mg into feeding tube 2 (two) times daily. For Depression  . ranitidine (ZANTAC) 15 MG/ML syrup Place 5 mg into feeding tube every 12 (twelve) hours.   . Sodium Phosphates (RA SALINE ENEMA) 19-7 GM/118ML ENEM Place 1 enema rectally daily as needed (for constipation not  relieved by Dulcolax suppository and call MD if not relieved by enema).  . STERILE WATER PO Place 125 mLs into feeding tube every 4 (four) hours.   No facility-administered encounter medications on file as of 07/12/2018.      Cannon Arreola Jenetta Downer, NP

## 2018-07-12 NOTE — Assessment & Plan Note (Signed)
Fever may possibly represent CVA phenomenon Palliative Care consult

## 2018-07-16 LAB — BASIC METABOLIC PANEL  EGFR
Calcium: 9.4
Carbon Dioxide, Total: 19
Chloride: 125

## 2018-07-18 ENCOUNTER — Other Ambulatory Visit: Payer: Self-pay

## 2018-07-18 ENCOUNTER — Non-Acute Institutional Stay: Payer: Medicare Other | Admitting: Adult Health Nurse Practitioner

## 2018-07-18 DIAGNOSIS — Z515 Encounter for palliative care: Secondary | ICD-10-CM

## 2018-07-18 NOTE — Progress Notes (Signed)
[place] Consult Note Telephone: [phone]  Fax: [phone]  PATIENT NAME: Sean Collins DOB: Jul 21, 1950 MRN: [id number]  PRIMARY CARE PROVIDER:   [name], MD  REFERRING PROVIDER:  [name], MD [address] [place], [place] [address]  RESPONSIBLE PARTY:   Colburn Asper (brother) [phone]        RECOMMENDATIONS and PLAN:  1. Aspiration Risk.  Patient had recent hospitalization for sepsis related to aspiration pneumonia. Patient finished antibiotics on 3/10 for pneumonia.  PCP states that he does not have as much secretions and does not gurgle as much as he did before.  Did not start scopolamine patch at this time.  Continue to monitorfor excess secretions as patient is still at risk for aspiration.   2. Goals of care.  Patient is  full code. Had care plan meeting with brother, PCP, and facility staff.  Patient and brother both expressed that they wanted everything that could be done to prolong life.  MOST form completed and left at facility indicating full scope of treatment  I spent 30 minutes providing this consultation,  from 2:00 to 2:30. More than 50% of the time in this consultation was spent coordinating communication.   HISTORY OF PRESENT ILLNESS: Sean Collins is a 68 y.o. year old male with multiple medical problems including aphasia and  right sided hemiparesis post CVA, HTN, HLD, DMT2, renal insufficiency. Palliative Care was asked to help address goals of care. Patient is nonverbal and is NPO due to aphasia post CVA.  Patient has PEG tube in which he gets continuous feeding and his meds are given through the PEG tube.   CODE STATUS: Full Code  PPS: 20% HOSPICE ELIGIBILITY/DIAGNOSIS: TBD  PHYSICAL EXAM:   General: patient is sitting in wheelchair in NAD Cardiovascular: regular rate and rhythm Pulmonary: lung sounds clear, normal respiratory effort Abdomen: soft, nontender, + bowel  sounds; PEG tube intact GU: no suprapubic tenderness Extremities: no edema, contractures of left hand Skin: no rashes; no wounds per staff report Neurological: weakness. left sided hemiparesis, aphasia, nonverbal post CVA.   Today patient is sitting up in wheelchair and more responsive than he was at last visit.  He is nonverbal but does try to speak.  He is able to write out some things that he wants.  He appears to understand our conversation and is able to make his needs known  PAST MEDICAL HISTORY:  Past Medical History:  Diagnosis Date  . Diabetes mellitus without complication ([place])   . Dysarthria   . Dysphagia   . GI bleed   . Hyperlipidemia   . Hypertension   . Renal insufficiency 09/22/2016  . Stroke [facility])     SOCIAL HX:  Social History   Tobacco Use  . Smoking status: Former Smoker    Packs/day: 0.50    Years: 32.00    Pack years: 16.00    Types: Cigarettes  . Smokeless tobacco: Never Used  . Tobacco comment: Started at age 68 though quit for 10 years at one point  Substance Use Topics  . Alcohol use: No    Alcohol/week: 0.0 standard drinks    ALLERGIES: No Known Allergies   PERTINENT MEDICATIONS:  Outpatient Encounter Medications as of 07/18/2018  Medication Sig  . allopurinol (ZYLOPRIM) 150 mg TABS tablet Place 150 mg into feeding tube daily.  Marland Kitchen aspirin 325 MG tablet Place 325 mg into feeding tube daily.  Marland Kitchen atenolol (TENORMIN) 50 MG  tablet Place 50 mg into feeding tube See admin instructions. 50 mg per tube every 12 hours and hold if Systolic reading is <709  . atorvastatin (LIPITOR) 80 MG tablet Place 80 mg into feeding tube daily.   . Cholecalciferol (VITAMIN D-3) 25 MCG (1000 UT) CAPS Place 1,000 Units into feeding tube daily.  . divalproex (DEPAKOTE SPRINKLE) 125 MG capsule 125 mg 2 (two) times daily. PER TUBE  . folic acid (FOLVITE) 1 MG tablet Place 1 mg into feeding tube daily.   Marland Kitchen gabapentin (NEURONTIN) 100 MG capsule 100 mg at bedtime. PER PEG Tube  .  guaifenesin (TUSSIN) 100 MG/5ML syrup TUSSIONEX PENNKINETIC SUSP ADMINISTER 5ML PER G-TUBE EVERY 12 HOURS X4 DAYS  . insulin aspart (NOVOLOG) 100 UNIT/ML injection Before each meal 3 times a day, 140-199 - 2 units, 200-250 - 4 units, 251-299 - 6 units,  300-349 - 8 units,  350 or above 10 units. Insulin PEN if approved, provide syringes and needles if needed.  . Lactobacillus (PROBIOTIC ACIDOPHILUS) TABS PROBIOTIC & ACIDOPHILUS CAP GIVE 1 CAPSULE TWICE DAILY PER G-TUBE FOR 14DAYS  . magnesium hydroxide (MILK OF MAGNESIA) 400 MG/5ML suspension Place 30 mLs into feeding tube daily as needed for mild constipation.  . Menthol, Topical Analgesic, (BIOFREEZE) 4 % GEL Apply 1 application topically See admin instructions. Apply to left hand 2 times a day FOR PAIN  . Nutritional Supplements (ISOSOURCE 1.5 CAL) LIQD Give 65 mL/hr by tube continuous. This will provide 2340 kcal/day and  "125 ml flush every 4 hours & 30 ml with meds"  . Nutritional Supplements (ISOSOURCE) LIQD Place 2 Cans into feeding tube See admin instructions. Bolus 2 cans of Isosource every shift until feeding tube is available, then resume continuous feedings  . OXYGEN Inhale into the lungs See admin instructions. Keep oxygen at >90% sat  . QUEtiapine (SEROQUEL) 25 MG tablet Place 25 mg into feeding tube 2 (two) times daily. For Depression  . ranitidine (ZANTAC) 15 MG/ML syrup Place 5 mg into feeding tube every 12 (twelve) hours.   . Sodium Phosphates (RA SALINE ENEMA) 19-7 GM/118ML ENEM Place 1 enema rectally daily as needed (for constipation not relieved by Dulcolax suppository and call MD if not relieved by enema).  . STERILE WATER PO Place 125 mLs into feeding tube every 4 (four) hours.   No facility-administered encounter medications on file as of 07/18/2018.       Roland Lipke Jenetta Downer, NP

## 2018-07-27 ENCOUNTER — Inpatient Hospital Stay (HOSPITAL_COMMUNITY)
Admission: EM | Admit: 2018-07-27 | Discharge: 2018-08-01 | DRG: 871 | Disposition: A | Discharge status: Skilled Nursing Facility | Payer: Medicare Other | Attending: Family Medicine | Admitting: Family Medicine

## 2018-07-27 ENCOUNTER — Emergency Department (HOSPITAL_COMMUNITY): Payer: Medicare Other

## 2018-07-27 ENCOUNTER — Other Ambulatory Visit: Payer: Self-pay

## 2018-07-27 ENCOUNTER — Encounter (HOSPITAL_COMMUNITY): Payer: Self-pay

## 2018-07-27 DIAGNOSIS — Z515 Encounter for palliative care: Secondary | ICD-10-CM | POA: Diagnosis present

## 2018-07-27 DIAGNOSIS — F323 Major depressive disorder, single episode, severe with psychotic features: Secondary | ICD-10-CM | POA: Diagnosis present

## 2018-07-27 DIAGNOSIS — Z9981 Dependence on supplemental oxygen: Secondary | ICD-10-CM | POA: Diagnosis not present

## 2018-07-27 DIAGNOSIS — J9610 Chronic respiratory failure, unspecified whether with hypoxia or hypercapnia: Secondary | ICD-10-CM | POA: Diagnosis present

## 2018-07-27 DIAGNOSIS — E870 Hyperosmolality and hypernatremia: Secondary | ICD-10-CM | POA: Diagnosis present

## 2018-07-27 DIAGNOSIS — Z20828 Contact with and (suspected) exposure to other viral communicable diseases: Secondary | ICD-10-CM

## 2018-07-27 DIAGNOSIS — N139 Obstructive and reflux uropathy, unspecified: Secondary | ICD-10-CM | POA: Diagnosis present

## 2018-07-27 DIAGNOSIS — N179 Acute kidney failure, unspecified: Secondary | ICD-10-CM | POA: Diagnosis present

## 2018-07-27 DIAGNOSIS — I10 Essential (primary) hypertension: Secondary | ICD-10-CM | POA: Diagnosis present

## 2018-07-27 DIAGNOSIS — E1121 Type 2 diabetes mellitus with diabetic nephropathy: Secondary | ICD-10-CM | POA: Diagnosis present

## 2018-07-27 DIAGNOSIS — J690 Pneumonitis due to inhalation of food and vomit: Secondary | ICD-10-CM | POA: Diagnosis present

## 2018-07-27 DIAGNOSIS — I2510 Atherosclerotic heart disease of native coronary artery without angina pectoris: Secondary | ICD-10-CM | POA: Diagnosis not present

## 2018-07-27 DIAGNOSIS — Z794 Long term (current) use of insulin: Secondary | ICD-10-CM

## 2018-07-27 DIAGNOSIS — Z8673 Personal history of transient ischemic attack (TIA), and cerebral infarction without residual deficits: Secondary | ICD-10-CM

## 2018-07-27 DIAGNOSIS — F0150 Vascular dementia without behavioral disturbance: Secondary | ICD-10-CM | POA: Diagnosis present

## 2018-07-27 DIAGNOSIS — J189 Pneumonia, unspecified organism: Secondary | ICD-10-CM | POA: Diagnosis not present

## 2018-07-27 DIAGNOSIS — R339 Retention of urine, unspecified: Secondary | ICD-10-CM | POA: Diagnosis not present

## 2018-07-27 DIAGNOSIS — D649 Anemia, unspecified: Secondary | ICD-10-CM | POA: Diagnosis present

## 2018-07-27 DIAGNOSIS — E875 Hyperkalemia: Secondary | ICD-10-CM | POA: Diagnosis present

## 2018-07-27 DIAGNOSIS — Z833 Family history of diabetes mellitus: Secondary | ICD-10-CM | POA: Diagnosis not present

## 2018-07-27 DIAGNOSIS — E785 Hyperlipidemia, unspecified: Secondary | ICD-10-CM | POA: Diagnosis present

## 2018-07-27 DIAGNOSIS — I69354 Hemiplegia and hemiparesis following cerebral infarction affecting left non-dominant side: Secondary | ICD-10-CM | POA: Diagnosis not present

## 2018-07-27 DIAGNOSIS — N320 Bladder-neck obstruction: Secondary | ICD-10-CM | POA: Diagnosis present

## 2018-07-27 DIAGNOSIS — I69320 Aphasia following cerebral infarction: Secondary | ICD-10-CM

## 2018-07-27 DIAGNOSIS — Z823 Family history of stroke: Secondary | ICD-10-CM | POA: Diagnosis not present

## 2018-07-27 DIAGNOSIS — Z79899 Other long term (current) drug therapy: Secondary | ICD-10-CM

## 2018-07-27 DIAGNOSIS — R4701 Aphasia: Secondary | ICD-10-CM | POA: Diagnosis present

## 2018-07-27 DIAGNOSIS — A419 Sepsis, unspecified organism: Principal | ICD-10-CM | POA: Diagnosis present

## 2018-07-27 DIAGNOSIS — M109 Gout, unspecified: Secondary | ICD-10-CM | POA: Diagnosis present

## 2018-07-27 DIAGNOSIS — R6520 Severe sepsis without septic shock: Secondary | ICD-10-CM

## 2018-07-27 DIAGNOSIS — K9423 Gastrostomy malfunction: Secondary | ICD-10-CM

## 2018-07-27 LAB — CBC WITH DIFFERENTIAL/PLATELET
Abs Immature Granulocytes: 0.09 10*3/uL — ABNORMAL HIGH (ref 0.00–0.07)
Basophils Absolute: 0 10*3/uL (ref 0.0–0.1)
Basophils Relative: 0 %
Eosinophils Absolute: 0.1 10*3/uL (ref 0.0–0.5)
Eosinophils Relative: 1 %
HCT: 32.2 % — ABNORMAL LOW (ref 39.0–52.0)
Hemoglobin: 9.9 g/dL — ABNORMAL LOW (ref 13.0–17.0)
Immature Granulocytes: 1 %
Lymphocytes Relative: 5 %
Lymphs Abs: 0.5 10*3/uL — ABNORMAL LOW (ref 0.7–4.0)
MCH: 28 pg (ref 26.0–34.0)
MCHC: 30.7 g/dL (ref 30.0–36.0)
MCV: 91.2 fL (ref 80.0–100.0)
Monocytes Absolute: 0.6 10*3/uL (ref 0.1–1.0)
Monocytes Relative: 5 %
Neutro Abs: 10.1 10*3/uL — ABNORMAL HIGH (ref 1.7–7.7)
Neutrophils Relative %: 88 %
Platelets: 134 10*3/uL — ABNORMAL LOW (ref 150–400)
RBC: 3.53 MIL/uL — ABNORMAL LOW (ref 4.22–5.81)
RDW: 16.5 % — ABNORMAL HIGH (ref 11.5–15.5)
WBC: 11.4 10*3/uL — ABNORMAL HIGH (ref 4.0–10.5)
nRBC: 0.2 % (ref 0.0–0.2)

## 2018-07-27 LAB — COMPREHENSIVE METABOLIC PANEL WITH GFR
ALT: 26 U/L (ref 0–44)
AST: 43 U/L — ABNORMAL HIGH (ref 15–41)
Albumin: 2 g/dL — ABNORMAL LOW (ref 3.5–5.0)
Alkaline Phosphatase: 78 U/L (ref 38–126)
Anion gap: 13 (ref 5–15)
BUN: 134 mg/dL — ABNORMAL HIGH (ref 8–23)
CO2: 18 mmol/L — ABNORMAL LOW (ref 22–32)
Calcium: 9.1 mg/dL (ref 8.9–10.3)
Chloride: 109 mmol/L (ref 98–111)
Creatinine, Ser: 11.6 mg/dL — ABNORMAL HIGH (ref 0.61–1.24)
GFR calc Af Amer: 5 mL/min — ABNORMAL LOW
GFR calc non Af Amer: 4 mL/min — ABNORMAL LOW
Glucose, Bld: 134 mg/dL — ABNORMAL HIGH (ref 70–99)
Potassium: >7.5 mmol/L (ref 3.5–5.1)
Sodium: 140 mmol/L (ref 135–145)
Total Bilirubin: 0.4 mg/dL (ref 0.3–1.2)
Total Protein: 7 g/dL (ref 6.5–8.1)

## 2018-07-27 LAB — BASIC METABOLIC PANEL
Anion gap: 12 (ref 5–15)
BUN: 121 mg/dL — AB (ref 8–23)
CO2: 18 mmol/L — ABNORMAL LOW (ref 22–32)
Calcium: 10 mg/dL (ref 8.9–10.3)
Chloride: 111 mmol/L (ref 98–111)
Creatinine, Ser: 9.53 mg/dL — ABNORMAL HIGH (ref 0.61–1.24)
GFR calc Af Amer: 6 mL/min — ABNORMAL LOW (ref >60)
GFR calc non Af Amer: 5 mL/min — ABNORMAL LOW (ref >60)
GLUCOSE: 275 mg/dL — AB (ref 70–99)
Potassium: 6.9 mmol/L (ref 3.5–5.1)
Sodium: 141 mmol/L (ref 135–145)

## 2018-07-27 LAB — POTASSIUM: Potassium: >7.5 mmol/L (ref 3.5–5.1)

## 2018-07-27 LAB — URINALYSIS, ROUTINE W REFLEX MICROSCOPIC
Bilirubin Urine: NEGATIVE
Glucose, UA: NEGATIVE mg/dL
Hgb urine dipstick: NEGATIVE
Ketones, ur: NEGATIVE mg/dL
Leukocytes,Ua: NEGATIVE
Nitrite: NEGATIVE
Protein, ur: NEGATIVE mg/dL
Specific Gravity, Urine: 1.012 (ref 1.005–1.030)
pH: 7 (ref 5.0–8.0)

## 2018-07-27 LAB — INFLUENZA PANEL BY PCR (TYPE A & B)
Influenza A By PCR: NEGATIVE
Influenza B By PCR: NEGATIVE

## 2018-07-27 LAB — POC OCCULT BLOOD, ED: Fecal Occult Bld: NEGATIVE

## 2018-07-27 LAB — LACTIC ACID, PLASMA: Lactic Acid, Venous: 1.5 mmol/L (ref 0.5–1.9)

## 2018-07-27 LAB — CBG MONITORING, ED: Glucose-Capillary: 151 mg/dL — ABNORMAL HIGH (ref 70–99)

## 2018-07-27 LAB — HEMOGLOBIN A1C
Hgb A1c MFr Bld: 6.8 % — ABNORMAL HIGH (ref 4.8–5.6)
Mean Plasma Glucose: 148.46 mg/dL

## 2018-07-27 MED ORDER — SODIUM CHLORIDE 0.9 % IV BOLUS
1000.0000 mL | Freq: Once | INTRAVENOUS | Status: AC
  Administered 2018-07-27: 1000 mL via INTRAVENOUS

## 2018-07-27 MED ORDER — SODIUM CHLORIDE 0.9 % IV SOLN: 2.0000 g | INTRAVENOUS | Status: DC

## 2018-07-27 MED ORDER — SODIUM CHLORIDE 0.9 % IV SOLN
1.0000 g | Freq: Once | INTRAVENOUS | Status: AC
  Administered 2018-07-27: 1 g via INTRAVENOUS
  Filled 2018-07-27: qty 10

## 2018-07-27 MED ORDER — SODIUM CHLORIDE 0.9 % IV SOLN: INTRAVENOUS | Status: DC | PRN

## 2018-07-27 MED ORDER — RANITIDINE HCL 150 MG/10ML PO SYRP
75.0000 mg | ORAL_SOLUTION | Freq: Two times a day (BID) | ORAL | Status: DC
  Administered 2018-07-27 – 2018-08-01 (×9): 75 mg
  Filled 2018-07-27 (×11): qty 10

## 2018-07-27 MED ORDER — DEXTROSE 50 % IV SOLN
1.0000 | Freq: Once | INTRAVENOUS | Status: AC
  Administered 2018-07-27: 50 mL via INTRAVENOUS
  Filled 2018-07-27: qty 50

## 2018-07-27 MED ORDER — MAGNESIUM HYDROXIDE 400 MG/5ML PO SUSP: 30.0000 mL | Freq: Every day | ORAL | Status: DC | PRN

## 2018-07-27 MED ORDER — VANCOMYCIN HCL 10 G IV SOLR
1500.0000 mg | Freq: Once | INTRAVENOUS | Status: AC
  Administered 2018-07-27: 1500 mg via INTRAVENOUS
  Filled 2018-07-27: qty 1500

## 2018-07-27 MED ORDER — SCOPOLAMINE 1 MG/3DAYS TD PT72
1.0000 | MEDICATED_PATCH | TRANSDERMAL | Status: DC
  Administered 2018-07-27 – 2018-07-30 (×2): 1.5 mg via TRANSDERMAL
  Filled 2018-07-27 (×3): qty 1

## 2018-07-27 MED ORDER — QUETIAPINE FUMARATE 25 MG PO TABS
25.0000 mg | ORAL_TABLET | Freq: Two times a day (BID) | ORAL | Status: DC
  Administered 2018-07-27 – 2018-08-01 (×9): 25 mg
  Filled 2018-07-27 (×9): qty 1

## 2018-07-27 MED ORDER — ATORVASTATIN CALCIUM 80 MG PO TABS
80.0000 mg | ORAL_TABLET | Freq: Every day | ORAL | Status: DC
  Administered 2018-07-27 – 2018-07-30 (×4): 80 mg
  Filled 2018-07-27 (×4): qty 1

## 2018-07-27 MED ORDER — ALBUTEROL SULFATE (2.5 MG/3ML) 0.083% IN NEBU: 10.0000 mg | INHALATION_SOLUTION | Freq: Once | RESPIRATORY_TRACT | Status: DC

## 2018-07-27 MED ORDER — VALPROIC ACID 250 MG/5ML PO SOLN
125.0000 mg | Freq: Every day | ORAL | Status: DC
  Administered 2018-07-27 – 2018-07-30 (×4): 125 mg
  Filled 2018-07-27 (×8): qty 5

## 2018-07-27 MED ORDER — ACETAMINOPHEN 325 MG PO TABS
650.0000 mg | ORAL_TABLET | Freq: Two times a day (BID) | ORAL | Status: DC
  Administered 2018-07-27 – 2018-08-01 (×9): 650 mg
  Filled 2018-07-27 (×9): qty 2

## 2018-07-27 MED ORDER — LACTATED RINGERS IV SOLN
INTRAVENOUS | Status: DC
  Administered 2018-07-27: 23:00:00 via INTRAVENOUS

## 2018-07-27 MED ORDER — INSULIN ASPART 100 UNIT/ML IV SOLN
5.0000 [IU] | Freq: Once | INTRAVENOUS | Status: AC
  Administered 2018-07-27: 5 [IU] via INTRAVENOUS

## 2018-07-27 MED ORDER — ASPIRIN 325 MG PO TABS
325.0000 mg | ORAL_TABLET | Freq: Every day | ORAL | Status: DC
  Administered 2018-07-28 – 2018-08-01 (×5): 325 mg
  Filled 2018-07-27 (×5): qty 1

## 2018-07-27 MED ORDER — VANCOMYCIN VARIABLE DOSE PER UNSTABLE RENAL FUNCTION (PHARMACIST DOSING): Status: DC

## 2018-07-27 MED ORDER — HEPARIN SODIUM (PORCINE) 5000 UNIT/ML IJ SOLN
5000.0000 [IU] | Freq: Three times a day (TID) | INTRAMUSCULAR | Status: DC
  Administered 2018-07-27 – 2018-08-01 (×15): 5000 [IU] via SUBCUTANEOUS
  Filled 2018-07-27 (×14): qty 1

## 2018-07-27 MED ORDER — SODIUM CHLORIDE 0.9 % IV SOLN: 500.0000 mg | INTRAVENOUS | Status: DC

## 2018-07-27 MED ORDER — SODIUM ZIRCONIUM CYCLOSILICATE 10 G PO PACK
10.0000 g | PACK | Freq: Three times a day (TID) | ORAL | Status: DC
  Administered 2018-07-27: 10 g via ORAL
  Filled 2018-07-27 (×2): qty 1

## 2018-07-27 MED ORDER — JEVITY 1.2 CAL PO LIQD
1000.0000 mL | ORAL | Status: DC
  Administered 2018-07-27 – 2018-07-29 (×2): 1000 mL
  Filled 2018-07-27 (×3): qty 1000

## 2018-07-27 MED ORDER — INSULIN ASPART 100 UNIT/ML ~~LOC~~ SOLN
0.0000 [IU] | Freq: Three times a day (TID) | SUBCUTANEOUS | Status: DC
  Administered 2018-07-28 (×2): 2 [IU] via SUBCUTANEOUS

## 2018-07-27 MED ORDER — ALLOPURINOL 300 MG PO TABS
150.0000 mg | ORAL_TABLET | Freq: Every day | ORAL | Status: DC
  Administered 2018-07-28 – 2018-08-01 (×5): 150 mg
  Filled 2018-07-27 (×5): qty 1

## 2018-07-27 MED ORDER — SODIUM CHLORIDE 0.9 % IV SOLN
2.0000 g | Freq: Once | INTRAVENOUS | Status: AC
  Administered 2018-07-27: 2 g via INTRAVENOUS
  Filled 2018-07-27: qty 2

## 2018-07-27 MED ORDER — ENOXAPARIN SODIUM 40 MG/0.4ML ~~LOC~~ SOLN: 40.0000 mg | SUBCUTANEOUS | Status: DC

## 2018-07-27 MED ORDER — DEXTROSE 5 % IV SOLN
500.0000 mg | INTRAVENOUS | Status: DC
  Filled 2018-07-27: qty 0.5

## 2018-07-27 MED ORDER — STERILE WATER PO LIQD: ORAL | Status: DC

## 2018-07-27 NOTE — ED Notes (Signed)
Called Elmyra Ricks back to advise pt will come to 2W37, Elmyra Ricks wants pt medicated for high potassium, labs drawn and if potassium comes down then patient can come to room.

## 2018-07-27 NOTE — ED Notes (Signed)
Discussed with Gretta Cool, charge nurse, give sodium zirconium and transfer pt to room.

## 2018-07-27 NOTE — ED Notes (Signed)
Paged Dr. Nani Ravens.

## 2018-07-27 NOTE — ED Notes (Signed)
ED TO INPATIENT HANDOFF REPORT  ED Nurse Name and Phone #: Angela Nevin [phone]  S Name/Age/Gender Sean Collins 69 y.o. male Room/Bed: 023C/023C  Code Status   Code Status: Full Code  Home/SNF/Other Nursing Home Patient oriented to: self Is this baseline? Yes   Triage Complete: Triage complete  Chief Complaint Altered, fever  Triage Note To room via EMS from [facility].  Onset this morning fever, 101.2, cough, shortness of breath.  [facility] gave Tylenol 325 mg, Albuterol 10mg , Rocephin 1g IM, Solumedrol 125 mg @ 10am.  Pt is less "fiesty" than his usual self.  Normally unable to answer any questions.   CBG 116 Seen 2 weeks ago for same.    Allergies No Known Allergies  Level of Care/Admitting Diagnosis ED Disposition    ED Disposition Condition [facility] Area: [place] [[id number]]  Level of Care: Med-Surg [16]  Diagnosis: HCAP (healthcare-associated pneumonia) [008676]  Admitting Physician: Shelda Pal [[id number]]  Attending Physician: Shelda Pal [phone]  Estimated length of stay: past midnight tomorrow  Certification:: I certify this patient will need inpatient services for at least 2 midnights  Bed request comments: Covid 19 rule out, low risk  PT Class (Do Not Modify): Inpatient [101]  PT Acc Code (Do Not Modify): Private [1]       B Medical/Surgery History Past Medical History:  Diagnosis Date  . Diabetes mellitus without complication ([place])   . Dysarthria   . Dysphagia   . GI bleed   . Hyperlipidemia   . Hypertension   . Renal insufficiency 09/22/2016  . Stroke [facility])    Past Surgical History:  Procedure Laterality Date  . ESOPHAGOGASTRODUODENOSCOPY (EGD) WITH PROPOFOL N/A 11/03/2015   Procedure: ESOPHAGOGASTRODUODENOSCOPY (EGD) WITH PROPOFOL;  Surgeon: Manus Gunning, MD;  Location: [place];  Service: Gastroenterology;  Laterality: N/A;  . IR REPLC GASTRO/COLONIC TUBE PERCUT  W/FLUORO  12/29/2016     A IV Location/Drains/Wounds Patient Lines/Drains/Airways Status   Active Line/Drains/Airways    Name:   Placement date:   Placement time:   Site:   Days:   Peripheral IV 07/27/18 Right Wrist   07/27/18    1325    Wrist   less than 1   Peripheral IV 07/27/18 Left External jugular   07/27/18    1554    External jugular   less than 1   Gastrostomy/Enterostomy Gastrostomy 20 Fr. LUQ   12/29/16    1039    LUQ   575   Urethral Catheter Angela Nevin Ashyra Cantin RN 16 Fr.   07/27/18    1604    -   less than 1          Intake/Output Last 24 hours  Intake/Output Summary (Last 24 hours) at 07/27/2018 1910 Last data filed at 07/27/2018 1851 Gross per 24 hour  Intake 1700 ml  Output -  Net 1700 ml    Labs/Imaging Results for orders placed or performed during the hospital encounter of 07/27/18 (from the past 48 hour(s))  Influenza panel by PCR (type A & B)     Status: None   Collection Time: 07/27/18  1:02 PM  Result Value Ref Range   Influenza A By PCR NEGATIVE NEGATIVE   Influenza B By PCR NEGATIVE NEGATIVE    Comment: (NOTE) The Xpert Xpress Flu assay is intended as an aid in the diagnosis of  influenza and should not be used as a sole basis for treatment.  This  assay is FDA approved for nasopharyngeal swab specimens only. Nasal  washings and aspirates are unacceptable for Xpert Xpress Flu testing. Performed at [facility], [place] [address], [place], [place] [address]   Comprehensive metabolic panel     Status: Abnormal   Collection Time: 07/27/18  1:14 PM  Result Value Ref Range   Sodium 140 135 - 145 mmol/L   Potassium >7.5 (HH) 3.5 - 5.1 mmol/L    Comment: SLIGHT HEMOLYSIS CRITICAL RESULT CALLED TO, READ BACK BY AND VERIFIED WITH: Tylor Courtwright, C RN @ 1437 ON 07/27/2018 BY TEMOCHE, H    Chloride 109 98 - 111 mmol/L   CO2 18 (L) 22 - 32 mmol/L   Glucose, Bld 134 (H) 70 - 99 mg/dL   BUN 134 (H) 8 - 23 mg/dL   Creatinine, Ser 11.60 (H) 0.61 - 1.24 mg/dL   Calcium  9.1 8.9 - 10.3 mg/dL   Total Protein 7.0 6.5 - 8.1 g/dL   Albumin 2.0 (L) 3.5 - 5.0 g/dL   AST 43 (H) 15 - 41 U/L   ALT 26 0 - 44 U/L   Alkaline Phosphatase 78 38 - 126 U/L   Total Bilirubin 0.4 0.3 - 1.2 mg/dL   GFR calc non Af Amer 4 (L) >60 mL/min   GFR calc Af Amer 5 (L) >60 mL/min   Anion gap 13 5 - 15    Comment: Performed at [facility], [place] [address], [place], [place] [address]  CBC WITH DIFFERENTIAL     Status: Abnormal   Collection Time: 07/27/18  1:14 PM  Result Value Ref Range   WBC 11.4 (H) 4.0 - 10.5 K/uL   RBC 3.53 (L) 4.22 - 5.81 MIL/uL   Hemoglobin 9.9 (L) 13.0 - 17.0 g/dL   HCT 32.2 (L) 39.0 - 52.0 %   MCV 91.2 80.0 - 100.0 fL   MCH 28.0 26.0 - 34.0 pg   MCHC 30.7 30.0 - 36.0 g/dL   RDW 16.5 (H) 11.5 - 15.5 %   Platelets 134 (L) 150 - 400 K/uL   nRBC 0.2 0.0 - 0.2 %   Neutrophils Relative % 88 %   Neutro Abs 10.1 (H) 1.7 - 7.7 K/uL   Lymphocytes Relative 5 %   Lymphs Abs 0.5 (L) 0.7 - 4.0 K/uL   Monocytes Relative 5 %   Monocytes Absolute 0.6 0.1 - 1.0 K/uL   Eosinophils Relative 1 %   Eosinophils Absolute 0.1 0.0 - 0.5 K/uL   Basophils Relative 0 %   Basophils Absolute 0.0 0.0 - 0.1 K/uL   Immature Granulocytes 1 %   Abs Immature Granulocytes 0.09 (H) 0.00 - 0.07 K/uL    Comment: Performed at [facility], [address], [place], [place] [address]  Lactic acid, plasma     Status: None   Collection Time: 07/27/18  1:26 PM  Result Value Ref Range   Lactic Acid, Venous 1.5 0.5 - 1.9 mmol/L    Comment: Performed at [facility] [address] [place],  [address]  POC occult blood, ED     Status: None   Collection Time: 07/27/18  3:41 PM  Result Value Ref Range   Fecal Occult Bld NEGATIVE NEGATIVE  Potassium     Status: Abnormal   Collection Time: 07/27/18  3:47 PM  Result Value Ref Range   Potassium >7.5 (HH) 3.5 - 5.1 mmol/L    Comment: REPEATED TO VERIFY NO VISIBLE HEMOLYSIS CRITICAL RESULT  CALLED TO, READ BACK BY AND  VERIFIED WITH: C.Skyy Mcknight,RN @ 7106 07/27/2018 [place] Performed at [facility], [place] [address] [place], [place] [address]   Urinalysis, Routine w reflex microscopic     Status: None   Collection Time: 07/27/18  3:50 PM  Result Value Ref Range   Color, Urine YELLOW YELLOW   APPearance CLEAR CLEAR   Specific Gravity, Urine 1.012 1.005 - 1.030   pH 7.0 5.0 - 8.0   Glucose, UA NEGATIVE NEGATIVE mg/dL   Hgb urine dipstick NEGATIVE NEGATIVE   Bilirubin Urine NEGATIVE NEGATIVE   Ketones, ur NEGATIVE NEGATIVE mg/dL   Protein, ur NEGATIVE NEGATIVE mg/dL   Nitrite NEGATIVE NEGATIVE   Leukocytes,Ua NEGATIVE NEGATIVE    Comment: Performed at [facility] [address] [place], [place] [address]  CBG monitoring, ED     Status: Abnormal   Collection Time: 07/27/18  6:05 PM  Result Value Ref Range   Glucose-Capillary 151 (H) 70 - 99 mg/dL  Hemoglobin A1c     Status: Abnormal   Collection Time: 07/27/18  6:36 PM  Result Value Ref Range   Hgb A1c MFr Bld 6.8 (H) 4.8 - 5.6 %    Comment: (NOTE) Pre diabetes:          5.7%-6.4% Diabetes:              >6.4% Glycemic control for   <7.0% adults with diabetes    Mean Plasma Glucose 148.46 mg/dL    Comment: Performed at [facility] [address] [place], [place] [address]   Dg Chest Port 1 View  Result Date: 07/27/2018 CLINICAL DATA:  Onset fever, cough and shortness of breath this morning. EXAM: PORTABLE CHEST 1 VIEW COMPARISON:  PA and lateral chest 07/07/2018 12/26/2015. FINDINGS: Lung volumes are low. Mild subsegmental atelectasis is seen in the bases. Heart size is enlarged. No pneumothorax or pleural effusion. IMPRESSION: Bibasilar subsegmental atelectasis. Cardiomegaly. Electronically Signed   By: Inge Rise M.D.   On: 07/27/2018 14:23    Pending Labs Unresulted Labs (From admission, onward)    Start     Ordered   08/03/18 0500  Creatinine, serum  (enoxaparin (LOVENOX)    CrCl >/= 30 ml/min)  Weekly,   R     Comments:  while on enoxaparin therapy    07/27/18 1905   07/27/18 1901  CBC  (enoxaparin (LOVENOX)    CrCl >/= 30 ml/min)  Once,   R    Comments:  Baseline for enoxaparin therapy IF NOT ALREADY DRAWN.  Notify MD if PLT < 100 K.    07/27/18 1905   07/27/18 1901  Creatinine, serum  (enoxaparin (LOVENOX)    CrCl >/= 30 ml/min)  Once,   R    Comments:  Baseline for enoxaparin therapy IF NOT ALREADY DRAWN.    07/27/18 1905   07/27/18 1859  Culture, blood (routine x 2) Call MD if unable to obtain prior to antibiotics being given  BLOOD CULTURE X 2,   R    Comments:  If blood cultures drawn in Emergency Department - Do not draw and cancel order    07/27/18 1905   07/27/18 1859  Culture, sputum-assessment  Once,   R     07/27/18 1905   07/27/18 1859  Gram stain  Once,   R     07/27/18 1905   07/27/18 1859  Strep pneumoniae urinary antigen  Once,   R  07/27/18 1905   07/27/18 1806  Respiratory Panel by PCR  (Respiratory virus panel with precautions)  Add-on,   R     07/27/18 1805   07/27/18 1740  Novel Coronavirus, NAA (hospital order; send-out to ref lab)  (Novel Coronavirus, NAA [facility] Order; send-out to ref lab) with precautions panel)  Add-on,   R    Question Answer Comment  Current symptoms Fever and Shortness of breath   Excluded other viral illnesses Yes   Exposure Risk None      07/27/18 1740   07/27/18 0454  Basic metabolic panel  ONCE - STAT,   R     07/27/18 1736   07/27/18 1401  Urine culture  ONCE - STAT,   STAT     07/27/18 1400   07/27/18 1301  Blood Culture (routine x 2)  BLOOD CULTURE X 2,   STAT     07/27/18 1301          Vitals/Pain Today's Vitals   07/27/18 1800 07/27/18 1801 07/27/18 1830 07/27/18 1900  BP: 124/69  122/66 116/72  Pulse:  73 77 79  Resp:  17 16 15   Temp:      TempSrc:      SpO2: 97% 97% 97% 97%  Weight:        Isolation Precautions Droplet and Contact precautions  Medications Medications  0.9 %  sodium chloride infusion  (has no administration in time range)  acetaminophen (TYLENOL) tablet 650 mg (has no administration in time range)  allopurinol (ZYLOPRIM) tablet 150 mg (has no administration in time range)  aspirin tablet 325 mg (has no administration in time range)  atorvastatin (LIPITOR) tablet 80 mg (has no administration in time range)  divalproex (DEPAKOTE) DR tablet 125 mg (has no administration in time range)  magnesium hydroxide (MILK OF MAGNESIA) suspension 30 mL (has no administration in time range)  QUEtiapine (SEROQUEL) tablet 25 mg (has no administration in time range)  ranitidine (ZANTAC) 15 MG/ML syrup 4.95 mg (has no administration in time range)  scopolamine (TRANSDERM-SCOP) 1 MG/3DAYS 1.5 mg (has no administration in time range)  Sterile Water LIQD (has no administration in time range)  insulin aspart (novoLOG) injection 0-9 Units (has no administration in time range)  feeding supplement (JEVITY 1.2 CAL) liquid 1,000 mL (has no administration in time range)  enoxaparin (LOVENOX) injection 40 mg (has no administration in time range)  lactated ringers infusion (has no administration in time range)  ceFEPIme (MAXIPIME) 1 g in sodium chloride 0.9 % 100 mL IVPB (has no administration in time range)  ceFEPIme (MAXIPIME) 2 g in sodium chloride 0.9 % 100 mL IVPB (0 g Intravenous Stopped 07/27/18 1851)  vancomycin (VANCOCIN) 1,500 mg in sodium chloride 0.9 % 500 mL IVPB (0 mg Intravenous Stopped 07/27/18 1737)  sodium chloride 0.9 % bolus 1,000 mL (0 mLs Intravenous Stopped 07/27/18 1745)  calcium chloride 1 g in sodium chloride 0.9 % 100 mL IVPB (0 g Intravenous Stopped 07/27/18 1851)  insulin aspart (novoLOG) injection 5 Units (5 Units Intravenous Given 07/27/18 1812)    And  dextrose 50 % solution 50 mL (50 mLs Intravenous Given 07/27/18 1817)    Mobility non-ambulatory High fall risk   Focused Assessments Pulmonary Assessment Handoff:  Lung sounds: L Breath Sounds: Clear, Diminished R Breath  Sounds: Diminished, Clear      R Recommendations: See Admitting Provider Note  Report given to:   Additional Notes:

## 2018-07-27 NOTE — ED Provider Notes (Signed)
Care assumed from previous provider PA [facility]. Please see note for further details. Case discussed, plan agreed upon. Briefly, patient is a 68 y.o. male who presents from facility for fever and presumed respiratory infection. He was given cefepime and vanc. Blood and urine cx's obtained.  CXR without PNA. Labs with drop in hgb: 14.6 at 3/02 and 9.9 today. Acute AKI - baseline creatinine around 1 and today his Cr is 11.60. Per previous provider, likely obstructive uropathy. Foley was placed and had > 1500cc removed. CMP also notes K+ of > 7.5, however hemolysis was noted. Repeat K+ is pending. UA pending at well.   Will follow up on pending UA, K+, flu. Will need to admit.   UA and flu negative.   Repeat K+ again > 7.5 - ca, albuterol and insulin/dextrose ordered.   Hospitalist consulted who will admit.    Shatzer, Ozella Almond, PA-C 07/27/18 [place], MD 07/27/18 1944

## 2018-07-27 NOTE — H&P (Signed)
History and Physical    Sean Collins YFV:[id number] DOB: [birth year] DOA: 07/27/2018  PCP: [name], MD  Patient coming from: [facility]  Chief Complaint: fever, cough, sob  HPI: Sean Collins is a 68 y.o. male with medical history significant of CVA, DM 2, HTN, HLD.  He is nonverbal and there is no family bedside.  Patient is nonverbal at baseline.  Reports that he is "feisty" and was much less so today.  He is being seen by the palliative care team on an outpatient basis.  He is still full code.  He had a temperature of 101.2 F this morning. Pt was given Tylenol prior to arrival in addition to solumedrol 125 mg, Rocephin 1 g IM.  No known exposure to COVID 19 or travelers to affected areas.  Pt recently discharged from this hosp for AKI and sepsis. His Cr was 2.15 on admission and 1.17 at d/c. His K was 5.4 at admission and 4.3 at d/c.   ED Course: Chest x-ray shows bibasilar subsegmental atelectasis with poor inspiratory effort.  IV vancomycin and cefepime were started.  Potassium was found to be 7.5, no EKG changes.  Calcium chloride, albuterol, and insulin were given.  Creatinine was 11.6 and his baseline is around 1.2.  He was straight catheterized and 1500 mL was removed.  Blood cultures were drawn.  Flu panel negative.  Review of Systems: Reliable 10 pt ROS unable to be obtained due to pt's nonverbal status.   Past Medical History:  Diagnosis Date  . Diabetes mellitus without complication ([place])   . Dysarthria   . Dysphagia   . GI bleed   . Hyperlipidemia   . Hypertension   . Renal insufficiency 09/22/2016  . Stroke [facility])     Past Surgical History:  Procedure Laterality Date  . ESOPHAGOGASTRODUODENOSCOPY (EGD) WITH PROPOFOL N/A 11/03/2015   Procedure: ESOPHAGOGASTRODUODENOSCOPY (EGD) WITH PROPOFOL;  Surgeon: Manus Gunning, MD;  Location: [facility];  Service: Gastroenterology;  Laterality: N/A;  . IR REPLC GASTRO/COLONIC TUBE PERCUT W/FLUORO   12/29/2016     reports that he has quit smoking. His smoking use included cigarettes. He has a 16.00 pack-year smoking history. He has never used smokeless tobacco. He reports that he does not drink alcohol or use drugs.  No Known Allergies  Family History  Problem Relation Age of Onset  . Stroke Brother   . Diabetes Brother   . Stroke Brother   . Stroke Maternal Aunt     Prior to Admission medications   Medication Sig Start Date End Date Taking? Authorizing Provider  acetaminophen (TYLENOL) 325 MG tablet Place 650 mg into feeding tube 2 (two) times daily.   Yes [provider]  allopurinol (ZYLOPRIM) 150 mg TABS tablet Place 150 mg into feeding tube daily.   Yes [provider]  aspirin 325 MG tablet Place 325 mg into feeding tube daily.   Yes [provider]  atorvastatin (LIPITOR) 80 MG tablet Place 80 mg into feeding tube at bedtime.    Yes [provider]  bisacodyl (DULCOLAX) 10 MG suppository Place 10 mg rectally daily as needed (for constipation not relieved by Milk of Mag).   Yes [provider]  Carboxymethylcellulose Sodium (REFRESH LIQUIGEL) 1 % GEL Place 1 drop into both eyes 3 (three) times daily.   Yes [provider]  cefTRIAXone Sodium (ROCEPHIN IJ) Inject 1 g as directed See admin instructions. Give 1 gram once a day for 3 days 07/27/18 07/29/18  Yes [provider]  Cholecalciferol (VITAMIN D-3) 25 MCG (1000 UT) CAPS Place 1,000 Units into feeding tube daily.   Yes [provider]  divalproex (DEPAKOTE) 125 MG DR tablet 125 mg See admin instructions. Give 125 mg per tube at bedtime for mood   Yes [provider]  folic acid (FOLVITE) 1 MG tablet Place 1 mg into feeding tube daily.    Yes [provider]  gabapentin (NEURONTIN) 100 MG capsule 100 mg See admin instructions. Give 100 mg per Peg Tube at bedtime   Yes [provider]  guaifenesin (TUSSIN) 100 MG/5ML syrup 200 mg  See admin instructions. Give 200 mg per tube three times a day   Yes [provider]  ipratropium-albuterol (DUONEB) 0.5-2.5 (3) MG/3ML SOLN Take 3 mLs by nebulization every 6 (six) hours as needed (for wheezing, congestion, or shortness of breath). 07/27/18 07/29/18 Yes [provider]  loperamide (IMODIUM A-D) 2 MG tablet Place 2-4 mg into feeding tube See admin instructions. Give 4 mg per tube with initial loose stool, then 2 mg after each subsequent loose stool with a max dose of 8 mg/24 hrs   Yes [provider]  magnesium hydroxide (MILK OF MAGNESIA) 400 MG/5ML suspension Place 30 mLs into feeding tube daily as needed for mild constipation.   Yes [provider]  Menthol, Topical Analgesic, (BIOFREEZE) 4 % GEL Apply 1 application topically See admin instructions. Apply to left hand 2 times a day FOR PAIN   Yes [provider]  methylPREDNISolone sodium succinate (SOLU-MEDROL) 125 mg/2 mL injection Inject 125 mg into the vein once.   Yes [provider]  Nutritional Supplements (ISOSOURCE 1.5 CAL) LIQD Give 65 mL/hr by tube continuous. This will provide 2,340 Kcal/day and  "125 ml flush every 4 hours & 30 ml with meds"   Yes [provider]  OXYGEN Inhale 2 L into the lungs See admin instructions. 2 liters of oxygen as needed for hypoxia and KEEP SAT AT >90%   Yes [provider]  QUEtiapine (SEROQUEL) 25 MG tablet Place 25 mg into feeding tube 2 (two) times daily.    Yes [provider]  ranitidine (ZANTAC) 15 MG/ML syrup Place 5 mg into feeding tube every 12 (twelve) hours.    Yes [provider]  scopolamine (TRANSDERM-SCOP) 1 MG/3DAYS Place 1 patch onto the skin every 3 (three) days. 07/25/18 07/28/18 Yes [provider]  STERILE WATER PO Place 125 mLs into feeding tube every 4 (four) hours. FLUSH   Yes [provider]  atenolol (TENORMIN) 50 MG tablet Place 50 mg into feeding tube See admin  instructions. 50 mg per tube every 12 hours and hold if Systolic reading is <440    [provider]  CEFTAZIDIME IV Inject 1 g into the vein See admin instructions. "Give 1 gram with lidocaine diluted once a day for 3 days" 07/27/18 07/29/18  [provider]  insulin aspart (NOVOLOG) 100 UNIT/ML injection Before each meal 3 times a day, 140-199 - 2 units, 200-250 - 4 units, 251-299 - 6 units,  300-349 - 8 units,  350 or above 10 units. Insulin PEN if approved, provide syringes and needles if needed. Patient not taking: Reported on 07/27/2018 07/10/18   Florencia Reasons, MD  Sodium Phosphates (RA SALINE ENEMA) 19-7 GM/118ML ENEM Place 1 enema rectally daily as needed (for constipation not relieved by Dulcolax suppository and call MD if not relieved by enema).    [provider]    Physical Exam: Vitals:   07/27/18 1500 07/27/18 1630 07/27/18 1700 07/27/18 1730  BP: 121/63 113/65 101/63 113/65  Pulse: 88 83 80 75  Resp: 20 19 18 16   Temp:      TempSrc:      SpO2: 100% 95% 96% 97%  Weight:        Constitutional: Appears stated age Eyes: PERRL, lids and conjunctivae normal ENMT: Mucous membranes are dry. Posterior pharynx clear of any exudate or lesions. Neck: normal, supple, no masses, no thyromegaly Respiratory: Transmitted upper airway noises, poor inspiratory effort Cardiovascular: RRR. No extremity edema. 2+ pedal pulses. No carotid bruits.  Abdomen: no tenderness, no masses palpated. No hepatosplenomegaly. Bowel sounds positive.  Musculoskeletal: no clubbing / cyanosis. No joint deformity upper and lower extremities. Good ROM, no contractures. Normal muscle tone.  Skin: no rashes, lesions, ulcers.  Neurologic: He is arousable to painful stimuli Psychiatric: Limited judgment and insight  Labs on Admission: I have personally reviewed following labs and imaging studies  CBC: Recent Labs  Lab 07/27/18 1314  WBC 11.4*  NEUTROABS 10.1*  HGB 9.9*  HCT 32.2*  MCV  91.2  PLT 952*   Basic Metabolic Panel: Recent Labs  Lab 07/27/18 1314 07/27/18 1547  NA 140  --   K >7.5* >7.5*  CL 109  --   CO2 18*  --   GLUCOSE 134*  --   BUN 134*  --   CREATININE 11.60*  --   CALCIUM 9.1  --    GFR: Estimated Creatinine Clearance: 6.2 mL/min (A) (by C-G formula based on SCr of 11.6 mg/dL (H)).   Liver Function Tests: Recent Labs  Lab 07/27/18 1314  AST 43*  ALT 26  ALKPHOS 78  BILITOT 0.4  PROT 7.0  ALBUMIN 2.0*   Urine analysis:    Component Value Date/Time   COLORURINE YELLOW 07/27/2018 1550   APPEARANCEUR CLEAR 07/27/2018 1550   LABSPEC 1.012 07/27/2018 1550   PHURINE 7.0 07/27/2018 1550   GLUCOSEU NEGATIVE 07/27/2018 1550   HGBUR NEGATIVE 07/27/2018 1550   BILIRUBINUR NEGATIVE 07/27/2018 1550   KETONESUR NEGATIVE 07/27/2018 1550   PROTEINUR NEGATIVE 07/27/2018 1550   UROBILINOGEN 1.0 09/21/2008 1431   NITRITE NEGATIVE 07/27/2018 1550   LEUKOCYTESUR NEGATIVE 07/27/2018 1550   Radiological Exams on Admission: Dg Chest Port 1 View  Result Date: 07/27/2018 CLINICAL DATA:  Onset fever, cough and shortness of breath this morning. EXAM: PORTABLE CHEST 1 VIEW COMPARISON:  PA and lateral chest 07/07/2018 12/26/2015. FINDINGS: Lung volumes are low. Mild subsegmental atelectasis is seen in the bases. Heart size is enlarged. No pneumothorax or pleural effusion. IMPRESSION: Bibasilar subsegmental atelectasis. Cardiomegaly. Electronically Signed   By: Inge Rise M.D.   On: 07/27/2018 14:23    EKG: Independently reviewed. Assessment/Plan Principal Problem:   HCAP (healthcare-associated pneumonia)  Continue cefepime and vancomycin for now  Check for coronavirus and respiratory viral panel   He is currently able to protect his airway  Gentle hydration, LR 50 mL/hour  Blood cx's pending   Active Problems:   Hyperkalemia   Monitor  Albuterol, Ca and insulin given in ED  EKG without changes, likely 2/2 obstruction  Spoke with nephro,  give Lokelma tonight, TID starting tomorrow and should continue to improve  Will keep on telemetry    AKI  Monitor BMP   Hyperlipidemia  Cont Lipitor   Gout  Cont Allopurinol   History of stroke  Cont ASA  Non-ambulatory    CAD (  coronary artery disease)   Type 2 diabetes mellitus with diabetic nephropathy, without long-term current use of insulin (HCC)  Low sensitivity SSI   Depressive psychosis (HCC)  Cont Depakote and Seroquel   Aphasia  NPO  All meds are IV, IM or per tube  DVT prophylaxis: SQ HPN Code Status: Full  Family Communication: [facility] Disposition Plan: [facility] Consults called: Spoke with Dr Jonnie Finner, does not need to consult as once under 7 with no EKG changes and urination, should continue to improve Admission status: Inpatient   Severity of Illness: The appropriate patient status for this patient is INPATIENT. Inpatient status is judged to be reasonable and necessary in order to provide the required intensity of service to ensure the patient's safety. The patient's presenting symptoms, physical exam findings, and initial radiographic and laboratory data in the context of their chronic comorbidities is felt to place them at high risk for further clinical deterioration. Furthermore, it is not anticipated that the patient will be medically stable for discharge from the hospital within 2 midnights of admission. The following factors support the patient status of inpatient.   " The patient's presenting symptoms include Dyspnea, fevers, cough. " The worrisome physical exam findings include sedated state. " The initial radiographic and laboratory data are worrisome because of Potassium and Cr. " The chronic co-morbidities include DM, Aphasia, HTN, CAD, hx of CVA with sequelae.   * I certify that at the point of admission it is my clinical judgment that the patient will require inpatient hospital care spanning beyond 2 midnights from the point of admission due to high  intensity of service, high risk for further deterioration and high frequency of surveillance required.*   Shelda Pal, DO [facility] [email or web address] 07/27/2018, 6:05 PM

## 2018-07-27 NOTE — ED Notes (Signed)
Called bed placement to advise pt changed to tele bed.  Advised pt can still go to 2W 37

## 2018-07-27 NOTE — ED Notes (Signed)
Sean Collins on 2W to advise pt will be changed to telemetry.

## 2018-07-27 NOTE — ED Notes (Signed)
Called report, charge nurse Elmyra Ricks.  Unable to take pt as med-surg d/t potassium.

## 2018-07-27 NOTE — ED Notes (Signed)
Attempted report 

## 2018-07-27 NOTE — ED Provider Notes (Signed)
Medical screening examination/treatment/procedure(s) were conducted as a shared visit with non-physician practitioner(s) and myself.  I personally evaluated the patient during the encounter.  Clinical Impression:   Final diagnoses:  AKI (acute kidney injury) ([place])  Sepsis with acute renal failure without septic shock, due to unspecified organism, unspecified acute renal failure type ([place])  Obstructive uropathy  Anemia, unspecified type   The patient is a 68 year old male, he has multiple medical problems, he has a known history of a stroke with residual aphasia and hemiparesis, he is nonverbal, he is feeding tube dependent and evidently has come from his skilled nursing facility today after being found to have a possible fever, possible cough or shortness of breath.  The patient is unable to tell us any of this and since arrival for the last 2 hours she has not coughed at all.  In fact his fever here is not present, that being said he is having difficulty with any significant amount of responsiveness other than to pain, he will withdraw all 4 extremities to pain.  On exam the patient does have what appears to be a distended bladder, he has a creatinine of 11 and a potassium that was over 7.5 on the initial labs.  Due to poor IV access I had to place an external jugular 18-gauge IV in his left external jugular vein on one attempt.  Blood was withdrawn and sent for repeat potassium.  The patient has no signs of pneumonia on his x-ray, he does not have an elevated lactic acid, his sodium is normal, CO2 is slightly decreased at 18 and his white blood cell count is 11,400 with a hemoglobin of 9.9 which has dropped approximately just over 2 units in the last 2 weeks.  Review of the medical record shows prior history of no significant anemia, prior history of thrombocytopenia though today he is 134,000.  I personally viewed and interpreted his anterior posterior view of the chest which shows no signs of  infiltrate.  I agree with radiologist interpretation.    The patient is critically ill with acute renal failure.  Will place a Foley catheter to decompress his bladder if in fact he is retaining urine as a postobstructive uropathy could very well be the cause of this acute renal failure.  Potassium will be rechecked and aggressively treated if it is elevated however his EKG on my interpretation shows no signs of significant hyperkalemia.  P waves are present, QRS is similar in with to old QRS, the patient is obtunded and not able to answer questions or engage in conversation.  Critical care provided  .Critical Care Performed by: Noemi Chapel, MD Authorized by: Noemi Chapel, MD   Critical care provider statement:    Critical care time (minutes):  35   Critical care time was exclusive of:  Separately billable procedures and treating other patients and teaching time   Critical care was necessary to treat or prevent imminent or life-threatening deterioration of the following conditions:  Renal failure   Critical care was time spent personally by me on the following activities:  Blood draw for specimens, development of treatment plan with patient or surrogate, discussions with consultants, evaluation of patient's response to treatment, examination of patient, obtaining history from patient or surrogate, ordering and performing treatments and interventions, ordering and review of laboratory studies, ordering and review of radiographic studies, pulse oximetry, re-evaluation of patient's condition and review of old charts       Noemi Chapel, MD 07/28/18 [id number]

## 2018-07-27 NOTE — ED Notes (Signed)
Per Dr. Nani Ravens will change bed from med-surg.   Aware of Potassium 6.9

## 2018-07-27 NOTE — Progress Notes (Signed)
Pharmacy Antibiotic Note  Sean Collins is a 68 y.o. male admitted on 07/27/2018 with fever, cough and SOB.  Pharmacy has been consulted for vancomycin dosing for PNA.  Cefepime loading dose ordered.  Patient with significantly AKI.  Afebrile, WBC 11.4, LA 1.5.  Plan: Vanc 1500mg  IV x 1 F/U renal fxn/plan for further dosing F/U with continuation of Gram negative coverage   Weight: 177 lb 7.5 oz (80.5 kg)  Temp (24hrs), Avg:99.8 F (37.7 C), Min:99.8 F (37.7 C), Max:99.8 F (37.7 C)  Recent Labs  Lab 07/27/18 1314 07/27/18 1326  WBC 11.4*  --   CREATININE 11.60*  --   LATICACIDVEN  --  1.5    Estimated Creatinine Clearance: 6.2 mL/min (A) (by C-G formula based on SCr of 11.6 mg/dL (H)).    No Known Allergies    Vanc 3/21 >> Cefepime  3/21 BCx -    Malesha Suliman D. Mina Marble, PharmD, BCPS, [place] 07/27/2018, 3:03 PM

## 2018-07-27 NOTE — ED Triage Notes (Signed)
To room via EMS from [facility].  Onset this morning fever, 101.2, cough, shortness of breath.  [facility] gave Tylenol 325 mg, Albuterol 10mg , Rocephin 1g IM, Solumedrol 125 mg @ 10am.  Pt is less "fiesty" than his usual self.  Normally unable to answer any questions.   CBG 116 Seen 2 weeks ago for same.

## 2018-07-27 NOTE — ED Provider Notes (Signed)
[facility] EMERGENCY DEPARTMENT Provider Note   CSN: [id number] Arrival date & time: 07/27/18  1246    History   Chief Complaint Chief Complaint  Patient presents with  . Fever  . Shortness of Breath  . Cough   Level 5 caveat invoked as patient is nonverbal at baseline HPI Sean Collins is a 68 y.o. male with history of CVA, type 2 diabetes mellitus, hypertension, hyperlipidemia, dysarthria, dysphagia, renal insufficiency presents via EMS from [facility] for evaluation of acute onset of fever, cough, shortness of breath.  Per EMS report, the patient was found to be less "feisty" than his usual self.  He was found to be febrile with a temperature of 101.2 F.  He was given 325 mg of Tylenol, albuterol 10 mg, Rocephin 1 g IM, and Solu-Medrol 1 to 25 mg at 10 AM.  Recently admitted for aspiration pneumonia and is at high risk for recurrent aspiration as he does not handle oral secretions efficiently and refuses suction or other assistance usually.  He is full code.     The history is provided by medical records and the EMS personnel. The history is limited by the condition of the patient.    Past Medical History:  Diagnosis Date  . Diabetes mellitus without complication ([place])   . Dysarthria   . Dysphagia   . GI bleed   . Hyperlipidemia   . Hypertension   . Renal insufficiency 09/22/2016  . Stroke [facility])     Patient Active Problem List   Diagnosis Date Noted  . Aphasia   . AKI (acute kidney injury) ([place]) 07/06/2018  . Hypernatremia 07/06/2018  . Dehydration   . Neoplasm of skin of abdomen 04/23/2018  . Elevated serum creatinine 01/22/2018  . Elevated AST (SGOT) 01/22/2018  . Thrombocytopenia ([place]) 01/22/2018  . Renal insufficiency 09/22/2016  . Type 2 diabetes mellitus with diabetic nephropathy, without long-term current use of insulin ([place]) 09/13/2016  . Depressive psychosis ([place]) 09/13/2016  . Cervical myofascial pain syndrome 08/29/2016  .  Cervical facet joint syndrome 08/29/2016  . Recurrent falls 07/11/2016  . Major depressive disorder, recurrent episode ([place]) 04/17/2016  . Shoulder pain, left 02/07/2016  . Essential hypertension 12/08/2015  . Late effects of CVA (cerebrovascular accident) 12/08/2015  . Cerebrovascular accident (CVA) due to thrombosis of right middle cerebral artery ([place])   . Slurred speech   . UGIB (upper gastrointestinal bleed)   . Malnutrition of moderate degree 11/04/2015  . Hematemesis without nausea   . Left hemiparesis ([place])   . Dysphagia   . Dysphagia due to old stroke   . Aspiration pneumonia ([place])   . Hyperlipidemia 11/16/2011  . Gout 11/16/2011  . History of stroke 11/16/2011  . CAD (coronary artery disease) 11/16/2011    Past Surgical History:  Procedure Laterality Date  . ESOPHAGOGASTRODUODENOSCOPY (EGD) WITH PROPOFOL N/A 11/03/2015   Procedure: ESOPHAGOGASTRODUODENOSCOPY (EGD) WITH PROPOFOL;  Surgeon: Manus Gunning, MD;  Location: [facility];  Service: Gastroenterology;  Laterality: N/A;  . IR REPLC GASTRO/COLONIC TUBE PERCUT W/FLUORO  12/29/2016        Home Medications    Prior to Admission medications   Medication Sig Start Date End Date Taking? Authorizing Provider  acetaminophen (TYLENOL) 325 MG tablet Place 650 mg into feeding tube 2 (two) times daily.   Yes [provider]  allopurinol (ZYLOPRIM) 150 mg TABS tablet Place 150 mg into feeding tube daily.   Yes [provider]  aspirin 325 MG tablet Place  325 mg into feeding tube daily.   Yes [provider]  atorvastatin (LIPITOR) 80 MG tablet Place 80 mg into feeding tube at bedtime.    Yes [provider]  bisacodyl (DULCOLAX) 10 MG suppository Place 10 mg rectally daily as needed (for constipation not relieved by Milk of Mag).   Yes [provider]  Carboxymethylcellulose Sodium (REFRESH LIQUIGEL) 1 % GEL Place 1 drop into both eyes 3 (three) times daily.   Yes  [provider]  cefTRIAXone Sodium (ROCEPHIN IJ) Inject 1 g as directed See admin instructions. Give 1 gram once a day for 3 days 07/27/18 07/29/18 Yes [provider]  Cholecalciferol (VITAMIN D-3) 25 MCG (1000 UT) CAPS Place 1,000 Units into feeding tube daily.   Yes [provider]  divalproex (DEPAKOTE) 125 MG DR tablet 125 mg See admin instructions. Give 125 mg per tube at bedtime for mood   Yes [provider]  folic acid (FOLVITE) 1 MG tablet Place 1 mg into feeding tube daily.    Yes [provider]  gabapentin (NEURONTIN) 100 MG capsule 100 mg See admin instructions. Give 100 mg per Peg Tube at bedtime   Yes [provider]  guaifenesin (TUSSIN) 100 MG/5ML syrup 200 mg See admin instructions. Give 200 mg per tube three times a day   Yes [provider]  ipratropium-albuterol (DUONEB) 0.5-2.5 (3) MG/3ML SOLN Take 3 mLs by nebulization every 6 (six) hours as needed (for wheezing, congestion, or shortness of breath). 07/27/18 07/29/18 Yes [provider]  loperamide (IMODIUM A-D) 2 MG tablet Place 2-4 mg into feeding tube See admin instructions. Give 4 mg per tube with initial loose stool, then 2 mg after each subsequent loose stool with a max dose of 8 mg/24 hrs   Yes [provider]  magnesium hydroxide (MILK OF MAGNESIA) 400 MG/5ML suspension Place 30 mLs into feeding tube daily as needed for mild constipation.   Yes [provider]  Menthol, Topical Analgesic, (BIOFREEZE) 4 % GEL Apply 1 application topically See admin instructions. Apply to left hand 2 times a day FOR PAIN   Yes [provider]  methylPREDNISolone sodium succinate (SOLU-MEDROL) 125 mg/2 mL injection Inject 125 mg into the vein once.   Yes [provider]  Nutritional Supplements (ISOSOURCE 1.5 CAL) LIQD Give 65 mL/hr by tube continuous. This will provide 2,340 Kcal/day and  "125 ml flush every 4 hours & 30 ml with meds"    Yes [provider]  OXYGEN Inhale 2 L into the lungs See admin instructions. 2 liters of oxygen as needed for hypoxia and KEEP SAT AT >90%   Yes [provider]  QUEtiapine (SEROQUEL) 25 MG tablet Place 25 mg into feeding tube 2 (two) times daily.    Yes [provider]  ranitidine (ZANTAC) 15 MG/ML syrup Place 5 mg into feeding tube every 12 (twelve) hours.    Yes [provider]  scopolamine (TRANSDERM-SCOP) 1 MG/3DAYS Place 1 patch onto the skin every 3 (three) days. 07/25/18 07/28/18 Yes [provider]  STERILE WATER PO Place 125 mLs into feeding tube every 4 (four) hours. FLUSH   Yes [provider]  atenolol (TENORMIN) 50 MG tablet Place 50 mg into feeding tube See admin instructions. 50 mg per tube every 12 hours and hold if Systolic reading is <347    [provider]  CEFTAZIDIME IV Inject 1 g into the vein See admin instructions. "Give 1 gram with lidocaine  diluted once a day for 3 days" 07/27/18 07/29/18  [provider]  insulin aspart (NOVOLOG) 100 UNIT/ML injection Before each meal 3 times a day, 140-199 - 2 units, 200-250 - 4 units, 251-299 - 6 units,  300-349 - 8 units,  350 or above 10 units. Insulin PEN if approved, provide syringes and needles if needed. Patient not taking: Reported on 07/27/2018 07/10/18   Florencia Reasons, MD  Sodium Phosphates (RA SALINE ENEMA) 19-7 GM/118ML ENEM Place 1 enema rectally daily as needed (for constipation not relieved by Dulcolax suppository and call MD if not relieved by enema).    [provider]    Family History Family History  Problem Relation Age of Onset  . Stroke Brother   . Diabetes Brother   . Stroke Brother   . Stroke Maternal Aunt     Social History Social History   Tobacco Use  . Smoking status: Former Smoker    Packs/day: 0.50    Years: 32.00    Pack years: 16.00    Types: Cigarettes  . Smokeless tobacco: Never Used  . Tobacco comment: Started at age  46 though quit for 10 years at one point  Substance Use Topics  . Alcohol use: No    Alcohol/week: 0.0 standard drinks  . Drug use: No     Allergies   Patient has no known allergies.   Review of Systems Review of Systems  Unable to perform ROS: Patient nonverbal     Physical Exam Updated Vital Signs BP 121/63   Pulse 88   Temp 99.8 F (37.7 C) (Rectal)   Resp 20   Wt 80.5 kg   SpO2 100%   BMI 26.21 kg/m   Physical Exam Constitutional:      Comments: Chronically ill in appearance  Cardiovascular:     Rate and Rhythm: Normal rate and regular rhythm.  Pulmonary:     Effort: Tachypnea present.     Comments: Bibasilar crackles. Scattered wheezes.  Abdominal:     General: Bowel sounds are normal.     Tenderness: There is no abdominal tenderness. There is no guarding or rebound.     Comments: G-tube in place with no surrounding erythema, induration, or drainage. Bladder palpable around the level of the umbilicus.   Genitourinary:    Comments: Examination performed in the presence of a chaperone.  No frank rectal bleeding.  Moderate amount of light brown stool in the rectal vault. Musculoskeletal:     Right lower leg: He exhibits no tenderness. No edema.     Left lower leg: He exhibits no tenderness. No edema.  Skin:    General: Skin is warm and dry.  Neurological:     Mental Status: He is disoriented.     GCS: GCS eye subscore is 3. GCS verbal subscore is 3. GCS motor subscore is 5.      ED Treatments / Results  Labs (all labs ordered are listed, but only abnormal results are displayed) Labs Reviewed  COMPREHENSIVE METABOLIC PANEL - Abnormal; Notable for the following components:      Result Value   Potassium >7.5 (*)    CO2 18 (*)    Glucose, Bld 134 (*)    BUN 134 (*)    Creatinine, Ser 11.60 (*)    Albumin 2.0 (*)    AST 43 (*)    GFR calc non Af Amer 4 (*)    GFR calc Af Amer 5 (*)    All other components  within normal limits  CBC WITH  DIFFERENTIAL/PLATELET - Abnormal; Notable for the following components:   WBC 11.4 (*)    RBC 3.53 (*)    Hemoglobin 9.9 (*)    HCT 32.2 (*)    RDW 16.5 (*)    Platelets 134 (*)    Neutro Abs 10.1 (*)    Lymphs Abs 0.5 (*)    Abs Immature Granulocytes 0.09 (*)    All other components within normal limits  CULTURE, BLOOD (ROUTINE X 2)  CULTURE, BLOOD (ROUTINE X 2)  URINE CULTURE  LACTIC ACID, PLASMA  URINALYSIS, ROUTINE W REFLEX MICROSCOPIC  INFLUENZA PANEL BY PCR (TYPE A & B)  POTASSIUM  POTASSIUM  POC OCCULT BLOOD, ED    EKG EKG Interpretation  Date/Time:  Saturday July 27 2018 12:46:56 EDT Ventricular Rate:  96 PR Interval:    QRS Duration: 123 QT Interval:  345 QTC Calculation: 436 R Axis:   -39 Text Interpretation:  Sinus or ectopic atrial rhythm Left bundle branch block since last tracing no significant change Confirmed by Noemi Chapel [phone]) on 07/27/2018 1:58:14 PM   Radiology Dg Chest Port 1 View  Result Date: 07/27/2018 CLINICAL DATA:  Onset fever, cough and shortness of breath this morning. EXAM: PORTABLE CHEST 1 VIEW COMPARISON:  PA and lateral chest 07/07/2018 12/26/2015. FINDINGS: Lung volumes are low. Mild subsegmental atelectasis is seen in the bases. Heart size is enlarged. No pneumothorax or pleural effusion. IMPRESSION: Bibasilar subsegmental atelectasis. Cardiomegaly. Electronically Signed   By: Inge Rise M.D.   On: 07/27/2018 14:23    Procedures .Critical Care Performed by: Renita Papa, PA-C Authorized by: Renita Papa, PA-C   Critical care provider statement:    Critical care time (minutes):  35   Critical care was necessary to treat or prevent imminent or life-threatening deterioration of the following conditions:  Renal failure   Critical care was time spent personally by me on the following activities:  Discussions with consultants, evaluation of patient's response to treatment, examination of patient, ordering and performing  treatments and interventions, ordering and review of laboratory studies, ordering and review of radiographic studies, pulse oximetry, re-evaluation of patient's condition, obtaining history from patient or surrogate and review of old charts   I assumed direction of critical care for this patient from another provider in my specialty: no     (including critical care time)  Medications Ordered in ED Medications  ceFEPIme (MAXIPIME) 2 g in sodium chloride 0.9 % 100 mL IVPB (has no administration in time range)  vancomycin (VANCOCIN) 1,500 mg in sodium chloride 0.9 % 500 mL IVPB (1,500 mg Intravenous New Bag/Given 07/27/18 1458)  0.9 %  sodium chloride infusion (has no administration in time range)  sodium chloride 0.9 % bolus 1,000 mL (1,000 mLs Intravenous New Bag/Given 07/27/18 1457)     Initial Impression / Assessment and Plan / ED Course  I have reviewed the triage vital signs and the nursing notes.  Pertinent labs & imaging results that were available during my care of the patient were reviewed by me and considered in my medical decision making (see chart for details).        Patient presenting for evaluation sent from his rehab facility for evaluation of fever with subjective shortness of breath cough.  He was apparently febrile at 10 AM at his facility but received Tylenol and rectal temp here is within normal limits.  Lab work significant for leukocytosis, acutely elevated creatinine of 11.60 from a  baseline of around 1.1.  His hemoglobin has also dropped to 9.9 from 14.6 two weeks ago.  Initial potassium was thought to be greater than 7.5 but there is hemolysis.  IV access was obtained by Dr. Sabra Heck through the Left EJ, repeat potassium sample obtained to confirm hyperkalemia.  His EKG is reassuring however, with no acute changes.  His chest x-ray shows no evidence of pneumonia but does show some cardiomegaly and bibasilar subsegmental atelectasis.  His bladder on examination is palpable  above the level of the umbilicus.  A Foley catheter was placed with 1200 cc urine output.  This suggests AKI secondary to obstructive uropathy.  Pending UA to assess for UTI. On reassessment, patient appears more alert, responding to yes/no questions. Initially only responsive to pain.   4:36 PM Signed out to oncoming provider PA [place].  Pending UA, influenza test, and repeat potassium.  Will require admission for management of renal failure.  Patient seen and evaluate Dr. Sabra Heck who agrees with assessment and plan at this time.  Final Clinical Impressions(s) / ED Diagnoses   Final diagnoses:  AKI (acute kidney injury) ([place])  Sepsis with acute renal failure without septic shock, due to unspecified organism, unspecified acute renal failure type ([place])  Obstructive uropathy  Anemia, unspecified type    ED Discharge Orders    None       Debroah Baller 07/27/18 1638    Noemi Chapel, MD 07/28/18 [phone]

## 2018-07-28 ENCOUNTER — Other Ambulatory Visit: Payer: Self-pay

## 2018-07-28 DIAGNOSIS — Z8673 Personal history of transient ischemic attack (TIA), and cerebral infarction without residual deficits: Secondary | ICD-10-CM

## 2018-07-28 DIAGNOSIS — R6520 Severe sepsis without septic shock: Secondary | ICD-10-CM

## 2018-07-28 DIAGNOSIS — M109 Gout, unspecified: Secondary | ICD-10-CM

## 2018-07-28 DIAGNOSIS — E1121 Type 2 diabetes mellitus with diabetic nephropathy: Secondary | ICD-10-CM

## 2018-07-28 DIAGNOSIS — N139 Obstructive and reflux uropathy, unspecified: Secondary | ICD-10-CM

## 2018-07-28 DIAGNOSIS — E875 Hyperkalemia: Secondary | ICD-10-CM

## 2018-07-28 DIAGNOSIS — N179 Acute kidney failure, unspecified: Secondary | ICD-10-CM

## 2018-07-28 DIAGNOSIS — J189 Pneumonia, unspecified organism: Secondary | ICD-10-CM

## 2018-07-28 DIAGNOSIS — A419 Sepsis, unspecified organism: Principal | ICD-10-CM

## 2018-07-28 LAB — RESPIRATORY PANEL BY PCR

## 2018-07-28 LAB — URINE CULTURE: Culture: NO GROWTH

## 2018-07-28 LAB — CBC
HCT: 33.7 % — ABNORMAL LOW (ref 39.0–52.0)
Hemoglobin: 10.7 g/dL — ABNORMAL LOW (ref 13.0–17.0)
MCH: 28.9 pg (ref 26.0–34.0)
MCHC: 31.8 g/dL (ref 30.0–36.0)
MCV: 91.1 fL (ref 80.0–100.0)
Platelets: 142 10*3/uL — ABNORMAL LOW (ref 150–400)
RBC: 3.7 MIL/uL — ABNORMAL LOW (ref 4.22–5.81)
RDW: 16.4 % — ABNORMAL HIGH (ref 11.5–15.5)
WBC: 13.1 10*3/uL — ABNORMAL HIGH (ref 4.0–10.5)
nRBC: 0.2 % (ref 0.0–0.2)

## 2018-07-28 LAB — EXPECTORATED SPUTUM ASSESSMENT W GRAM STAIN, RFLX TO RESP C

## 2018-07-28 LAB — BASIC METABOLIC PANEL
Anion gap: 11 (ref 5–15)
Anion gap: 13 (ref 5–15)
Anion gap: 9 (ref 5–15)
BUN: 110 mg/dL — ABNORMAL HIGH (ref 8–23)
BUN: 111 mg/dL — ABNORMAL HIGH (ref 8–23)
BUN: 86 mg/dL — ABNORMAL HIGH (ref 8–23)
CO2: 17 mmol/L — ABNORMAL LOW (ref 22–32)
CO2: 17 mmol/L — ABNORMAL LOW (ref 22–32)
CO2: 21 mmol/L — ABNORMAL LOW (ref 22–32)
Calcium: 10 mg/dL (ref 8.9–10.3)
Calcium: 10.3 mg/dL (ref 8.9–10.3)
Calcium: 9.7 mg/dL (ref 8.9–10.3)
Chloride: 113 mmol/L — ABNORMAL HIGH (ref 98–111)
Chloride: 113 mmol/L — ABNORMAL HIGH (ref 98–111)
Chloride: 117 mmol/L — ABNORMAL HIGH (ref 98–111)
Creatinine, Ser: 3.53 mg/dL — ABNORMAL HIGH (ref 0.61–1.24)
Creatinine, Ser: 6.74 mg/dL — ABNORMAL HIGH (ref 0.61–1.24)
Creatinine, Ser: 7.78 mg/dL — ABNORMAL HIGH (ref 0.61–1.24)
GFR calc Af Amer: 8 mL/min — ABNORMAL LOW (ref >60)
GFR calc Af Amer: 9 mL/min — ABNORMAL LOW (ref >60)
GFR calc non Af Amer: 17 mL/min — ABNORMAL LOW (ref >60)
GFR calc non Af Amer: 6 mL/min — ABNORMAL LOW (ref >60)
GFR calc non Af Amer: 8 mL/min — ABNORMAL LOW (ref >60)
GFR, EST AFRICAN AMERICAN: 20 mL/min — AB (ref >60)
Glucose, Bld: 141 mg/dL — ABNORMAL HIGH (ref 70–99)
Glucose, Bld: 156 mg/dL — ABNORMAL HIGH (ref 70–99)
Glucose, Bld: 156 mg/dL — ABNORMAL HIGH (ref 70–99)
POTASSIUM: 7.2 mmol/L — AB (ref 3.5–5.1)
POTASSIUM: 7 mmol/L — AB (ref 3.5–5.1)
Potassium: 4.6 mmol/L (ref 3.5–5.1)
Sodium: 141 mmol/L (ref 135–145)
Sodium: 143 mmol/L (ref 135–145)
Sodium: 147 mmol/L — ABNORMAL HIGH (ref 135–145)

## 2018-07-28 LAB — GLUCOSE, CAPILLARY
GLUCOSE-CAPILLARY: 155 mg/dL — AB (ref 70–99)
GLUCOSE-CAPILLARY: 140 mg/dL — AB (ref 70–99)
Glucose-Capillary: 152 mg/dL — ABNORMAL HIGH (ref 70–99)
Glucose-Capillary: 153 mg/dL — ABNORMAL HIGH (ref 70–99)
Glucose-Capillary: 151 mg/dL — ABNORMAL HIGH (ref 70–99)

## 2018-07-28 LAB — STREP PNEUMONIAE URINARY ANTIGEN: Strep Pneumo Urinary Antigen: NEGATIVE

## 2018-07-28 LAB — MRSA PCR SCREENING: MRSA by PCR: POSITIVE — AB

## 2018-07-28 MED ORDER — DEXTROSE 50 % IV SOLN
1.0000 | Freq: Once | INTRAVENOUS | Status: AC
  Administered 2018-07-28: 50 mL via INTRAVENOUS
  Filled 2018-07-28: qty 50

## 2018-07-28 MED ORDER — TAMSULOSIN HCL 0.4 MG PO CAPS
0.4000 mg | ORAL_CAPSULE | Freq: Every day | ORAL | Status: DC
  Administered 2018-07-28: 0.4 mg via ORAL
  Filled 2018-07-28: qty 1

## 2018-07-28 MED ORDER — SODIUM CHLORIDE 0.9 % IV SOLN
1.0000 g | INTRAVENOUS | Status: DC
  Administered 2018-07-28 – 2018-07-30 (×3): 1 g via INTRAVENOUS
  Filled 2018-07-28 (×5): qty 1

## 2018-07-28 MED ORDER — TAMSULOSIN HCL 0.4 MG PO CAPS
0.4000 mg | ORAL_CAPSULE | Freq: Every day | ORAL | Status: DC
  Administered 2018-07-29 – 2018-07-31 (×3): 0.4 mg via ORAL
  Filled 2018-07-28 (×3): qty 1

## 2018-07-28 MED ORDER — SODIUM POLYSTYRENE SULFONATE 15 GM/60ML PO SUSP
30.0000 g | Freq: Once | ORAL | Status: AC
  Administered 2018-07-28: 30 g via ORAL
  Filled 2018-07-28: qty 120

## 2018-07-28 MED ORDER — SODIUM CHLORIDE 0.9 % IV SOLN
1.0000 g | Freq: Once | INTRAVENOUS | Status: AC
  Administered 2018-07-28: 1 g via INTRAVENOUS
  Filled 2018-07-28 (×2): qty 10

## 2018-07-28 MED ORDER — INSULIN ASPART 100 UNIT/ML IV SOLN
5.0000 [IU] | Freq: Once | INTRAVENOUS | Status: AC
  Administered 2018-07-28: 5 [IU] via INTRAVENOUS

## 2018-07-28 MED ORDER — GABAPENTIN 100 MG PO CAPS
100.0000 mg | ORAL_CAPSULE | Freq: Every day | ORAL | Status: DC
  Administered 2018-07-28 – 2018-07-30 (×3): 100 mg via ORAL
  Filled 2018-07-28 (×3): qty 1

## 2018-07-28 MED ORDER — INSULIN ASPART 100 UNIT/ML ~~LOC~~ SOLN
0.0000 [IU] | Freq: Four times a day (QID) | SUBCUTANEOUS | Status: DC
  Administered 2018-07-29 – 2018-07-30 (×2): 1 [IU] via SUBCUTANEOUS

## 2018-07-28 MED ORDER — PATIROMER SORBITEX CALCIUM 8.4 G PO PACK
8.4000 g | PACK | Freq: Every day | ORAL | Status: DC
  Administered 2018-07-28 – 2018-07-29 (×2): 8.4 g via ORAL
  Filled 2018-07-28 (×2): qty 1

## 2018-07-28 NOTE — Progress Notes (Signed)
PROGRESS NOTE    Sean Collins   TIW:[id number]  DOB: [birth year]  DOA: 07/27/2018 PCP: [name], MD   Brief Narrative:  Sean Collins is a 68 year old male from SNF with CVA and left-sided weakness and a aphasia, dysphagia with tube feeds, diabetes type 2, hypertension, hyperlipidemia being followed by palliative care who presents to the hospital with a temperature of 101.2. Chest x-ray shows bibasilar atelectasis with poor inspiratory effort. Started on IV vancomycin and cefepime in the ED. Urinary retention also noted in the ED with 15 cc of urine obtained after straight cath. Noted to be hyperkalemic potassium greater than 7.5 with a creatinine of 11.60.  Last admitted to the hospital from 2/29 through 3/4 AKI, thought to be secondary to an aspiration pneumonia.  Subjective: Aphasic. Does not appear to have any complaints.   Assessment & Plan:   Principal Problem:   HCAP (healthcare-associated pneumonia) -? recurrent aspiration pneumonia-also being ruled out for coronavirus -Continue Maxipime - The patient's MRSA screen is positive and he is from [facility] and therefore vancomycin will be continued  Active Problems:   Hyperkalemia   Acute kidney injury due to acute urinary retention - Start Flomax and continue Foley catheter - Creatinine and potassium are improving - start Veltassa for hyperkalemia- recheck K later today- no EKG changes noted  Prior CVA -Continue aspirin and Lipitor, tube feeds and supportive care  Type 2 diabetes mellitus with diabetic nephropathy - Continue sliding scale insulin   Gout - Allopurinol    Time spent in minutes: 35 DVT prophylaxis:  Heparin Code Status: Full code Family Communication: none  Disposition Plan: Return to SNF Consultants:   none Procedures:   none Antimicrobials:  Anti-infectives (From admission, onward)   Start     Dose/Rate Route Frequency Ordered Stop   07/28/18 1800  ceFEPIme (MAXIPIME)  500 mg in dextrose 5 % 50 mL IVPB     500 mg 100 mL/hr over 30 Minutes Intravenous Every 24 hours 07/27/18 1905 08/05/18 1759   07/27/18 1910  vancomycin variable dose per unstable renal function (pharmacist dosing)      Does not apply See admin instructions 07/27/18 1910     07/27/18 1420  ceFEPIme (MAXIPIME) 2 g in sodium chloride 0.9 % 100 mL IVPB     2 g 200 mL/hr over 30 Minutes Intravenous  Once 07/27/18 1359 07/27/18 1851   07/27/18 1415  vancomycin (VANCOCIN) 1,500 mg in sodium chloride 0.9 % 500 mL IVPB     1,500 mg 250 mL/hr over 120 Minutes Intravenous  Once 07/27/18 1410 07/27/18 1737   07/27/18 1315  cefTRIAXone (ROCEPHIN) 2 g in sodium chloride 0.9 % 100 mL IVPB  Status:  Discontinued     2 g 200 mL/hr over 30 Minutes Intravenous Every 24 hours 07/27/18 1301 07/27/18 1359   07/27/18 1315  azithromycin (ZITHROMAX) 500 mg in sodium chloride 0.9 % 250 mL IVPB  Status:  Discontinued     500 mg 250 mL/hr over 60 Minutes Intravenous Every 24 hours 07/27/18 1301 07/27/18 1359       Objective: Vitals:   07/27/18 1900 07/27/18 1930 07/27/18 2000 07/27/18 2239  BP: 116/72 117/73 (!) 142/82 138/78  Pulse: 79 77 81 86  Resp: 15 10 (!) 25 (!) 22  Temp:    99.8 F (37.7 C)  TempSrc:    Axillary  SpO2: 97% 97% 99%   Weight:    80.5 kg  Height:    5\' 9"  (1.753  m)    Intake/Output Summary (Last 24 hours) at 07/28/2018 8850 Last data filed at 07/28/2018 0600 Gross per 24 hour  Intake 2195.48 ml  Output 2640 ml  Net -444.52 ml   Filed Weights   07/27/18 1400 07/27/18 2239  Weight: 80.5 kg 80.5 kg    Examination: General exam: Appears comfortable  HEENT: PERRLA, oral mucosa moist, no sclera icterus or thrush Respiratory system: Clear to auscultation. Respiratory effort normal. Cardiovascular system: S1 & S2 heard, RRR.   Gastrointestinal system: Abdomen soft, non-tender, nondistended. Normal bowel sounds. PEG tube present.  Central nervous system: Alert - not able to move  left arm and leg Extremities: No cyanosis, clubbing or edema Skin: No rashes or ulcers Psychiatry:  Mood & affect appropriate.     Data Reviewed: I have personally reviewed following labs and imaging studies  CBC: Recent Labs  Lab 07/27/18 1314 07/28/18 0316  WBC 11.4* 13.1*  NEUTROABS 10.1*  --   HGB 9.9* 10.7*  HCT 32.2* 33.7*  MCV 91.2 91.1  PLT 134* 277*   Basic Metabolic Panel: Recent Labs  Lab 07/27/18 1314 07/27/18 1547 07/27/18 1836 07/27/18 2331 07/28/18 0434  NA 140  --  141 143 141  K >7.5* >7.5* 6.9* 7.0* 7.2*  CL 109  --  111 113* 113*  CO2 18*  --  18* 17* 17*  GLUCOSE 134*  --  275* 156* 141*  BUN 134*  --  121* 111* 110*  CREATININE 11.60*  --  9.53* 7.78* 6.74*  CALCIUM 9.1  --  10.0 10.0 10.3   GFR: Estimated Creatinine Clearance: 10.6 mL/min (A) (by C-G formula based on SCr of 6.74 mg/dL (H)). Liver Function Tests: Recent Labs  Lab 07/27/18 1314  AST 43*  ALT 26  ALKPHOS 78  BILITOT 0.4  PROT 7.0  ALBUMIN 2.0*   No results for input(s): LIPASE, AMYLASE in the last 168 hours. No results for input(s): AMMONIA in the last 168 hours. Coagulation Profile: No results for input(s): INR, PROTIME in the last 168 hours. Cardiac Enzymes: No results for input(s): CKTOTAL, CKMB, CKMBINDEX, TROPONINI in the last 168 hours. BNP (last 3 results) No results for input(s): PROBNP in the last 8760 hours. HbA1C: Recent Labs    07/27/18 1836  HGBA1C 6.8*   CBG: Recent Labs  Lab 07/27/18 1805 07/27/18 2212 07/28/18 0751  GLUCAP 151* 152* 153*   Lipid Profile: No results for input(s): CHOL, HDL, LDLCALC, TRIG, CHOLHDL, LDLDIRECT in the last 72 hours. Thyroid Function Tests: No results for input(s): TSH, T4TOTAL, FREET4, T3FREE, THYROIDAB in the last 72 hours. Anemia Panel: No results for input(s): VITAMINB12, FOLATE, FERRITIN, TIBC, IRON, RETICCTPCT in the last 72 hours. Urine analysis:    Component Value Date/Time   COLORURINE YELLOW  07/27/2018 1550   APPEARANCEUR CLEAR 07/27/2018 1550   LABSPEC 1.012 07/27/2018 1550   PHURINE 7.0 07/27/2018 1550   GLUCOSEU NEGATIVE 07/27/2018 1550   HGBUR NEGATIVE 07/27/2018 [place] 07/27/2018 [place] 07/27/2018 1550   PROTEINUR NEGATIVE 07/27/2018 1550   UROBILINOGEN 1.0 09/21/2008 1431   NITRITE NEGATIVE 07/27/2018 1550   LEUKOCYTESUR NEGATIVE 07/27/2018 1550   Sepsis Labs: @LABRCNTIP (procalcitonin:4,lacticidven:4) ) Recent Results (from the past 240 hour(s))  Culture, sputum-assessment     Status: None   Collection Time: 07/27/18 12:47 AM  Result Value Ref Range Status   Specimen Description SPUTUM  Final   Special Requests NONE  Final   Sputum evaluation   Final  Sputum specimen not acceptable for testing.  Please recollect.   RESULT CALLED TO, READ BACK BY AND VERIFIED WITH: GRUBEY AT 0700 ON [id number] BY SJW Performed at [facility], [place] [address] [place], [place] [address]    Report Status 07/28/2018 FINAL  Final  Blood Culture (routine x 2)     Status: None (Preliminary result)   Collection Time: 07/27/18  1:01 PM  Result Value Ref Range Status   Specimen Description BLOOD RIGHT WRIST  Final   Special Requests   Final    BOTTLES DRAWN AEROBIC AND ANAEROBIC Blood Culture adequate volume   Culture   Final    NO GROWTH < 24 HOURS Performed at [facility], [place] [address], [place], [place] [address]    Report Status PENDING  Incomplete  Blood Culture (routine x 2)     Status: None (Preliminary result)   Collection Time: 07/27/18  1:42 PM  Result Value Ref Range Status   Specimen Description BLOOD BLOOD RIGHT FOREARM  Final   Special Requests   Final    BOTTLES DRAWN AEROBIC ONLY Blood Culture results may not be optimal due to an inadequate volume of blood received in culture bottles   Culture   Final    NO GROWTH < 24 HOURS Performed at [facility], [place] [address], [place], [place] [address]    Report  Status PENDING  Incomplete  Respiratory Panel by PCR     Status: None   Collection Time: 07/27/18  3:31 PM  Result Value Ref Range Status   Adenovirus NOT DETECTED NOT DETECTED Final   Coronavirus 229E NOT DETECTED NOT DETECTED Final    Comment: (NOTE) The Coronavirus on the Respiratory Panel, DOES NOT test for the novel  Coronavirus (2019 nCoV)    Coronavirus HKU1 NOT DETECTED NOT DETECTED Final   Coronavirus NL63 NOT DETECTED NOT DETECTED Final   Coronavirus OC43 NOT DETECTED NOT DETECTED Final   Metapneumovirus NOT DETECTED NOT DETECTED Final   Rhinovirus / Enterovirus NOT DETECTED NOT DETECTED Final   Influenza A NOT DETECTED NOT DETECTED Final   Influenza B NOT DETECTED NOT DETECTED Final   Parainfluenza Virus 1 NOT DETECTED NOT DETECTED Final   Parainfluenza Virus 2 NOT DETECTED NOT DETECTED Final   Parainfluenza Virus 3 NOT DETECTED NOT DETECTED Final   Parainfluenza Virus 4 NOT DETECTED NOT DETECTED Final   Respiratory Syncytial Virus NOT DETECTED NOT DETECTED Final   Bordetella pertussis NOT DETECTED NOT DETECTED Final   Chlamydophila pneumoniae NOT DETECTED NOT DETECTED Final   Mycoplasma pneumoniae NOT DETECTED NOT DETECTED Final    Comment: Performed at [facility], [address], [place], [place] [address]  MRSA PCR Screening     Status: Abnormal   Collection Time: 07/28/18 12:26 AM  Result Value Ref Range Status   MRSA by PCR POSITIVE (A) NEGATIVE Final    Comment:        The GeneXpert MRSA Assay (FDA approved for NASAL specimens only), is one component of a comprehensive MRSA colonization surveillance program. It is not intended to diagnose MRSA infection nor to guide or monitor treatment for MRSA infections. RESULT CALLED TO, READ BACK BY AND VERIFIED WITH: JACOBSEN,V RN [id number] 07/28/2018 MITCHELL,L Performed at [facility] [address], [place], [place] [address]          Radiology Studies: Dg Chest [facility] 1 View  Result Date:  07/27/2018 CLINICAL DATA:  Onset fever,  cough and shortness of breath this morning. EXAM: PORTABLE CHEST 1 VIEW COMPARISON:  PA and lateral chest 07/07/2018 12/26/2015. FINDINGS: Lung volumes are low. Mild subsegmental atelectasis is seen in the bases. Heart size is enlarged. No pneumothorax or pleural effusion. IMPRESSION: Bibasilar subsegmental atelectasis. Cardiomegaly. Electronically Signed   By: Inge Rise M.D.   On: 07/27/2018 14:23      Scheduled Meds: . acetaminophen  650 mg Per Tube BID  . allopurinol  150 mg Per Tube Daily  . aspirin  325 mg Per Tube Daily  . atorvastatin  80 mg Per Tube QHS  . ceFEPime (MAXIPIME) IV  500 mg Intravenous Q24H  . heparin injection (subcutaneous)  5,000 Units Subcutaneous Q8H  . insulin aspart  0-9 Units Subcutaneous TID WC  . QUEtiapine  25 mg Per Tube BID  . ranitidine  75 mg Per Tube Q12H  . scopolamine  1 patch Transdermal Q72H  . valproic acid  125 mg Per Tube QHS  . vancomycin variable dose per unstable renal function (pharmacist dosing)   Does not apply See admin instructions   Continuous Infusions: . sodium chloride    . feeding supplement (JEVITY 1.2 CAL) 40 mL/hr at 07/28/18 0600  . lactated ringers 50 mL/hr at 07/27/18 2259     LOS: 1 day      Debbe Odea, MD [facility] Pager: [email or web address] Password [email or web address] 07/28/2018, 8:22 AM

## 2018-07-29 LAB — BASIC METABOLIC PANEL
Anion gap: 13 (ref 5–15)
Anion gap: 10 (ref 5–15)
BUN: 78 mg/dL — ABNORMAL HIGH (ref 8–23)
BUN: 67 mg/dL — ABNORMAL HIGH (ref 8–23)
CALCIUM: 9.6 mg/dL (ref 8.9–10.3)
CO2: 21 mmol/L — ABNORMAL LOW (ref 22–32)
CO2: 22 mmol/L (ref 22–32)
Calcium: 9.7 mg/dL (ref 8.9–10.3)
Chloride: 117 mmol/L — ABNORMAL HIGH (ref 98–111)
Chloride: 120 mmol/L — ABNORMAL HIGH (ref 98–111)
Creatinine, Ser: 2.58 mg/dL — ABNORMAL HIGH (ref 0.61–1.24)
Creatinine, Ser: 1.89 mg/dL — ABNORMAL HIGH (ref 0.61–1.24)
GFR calc Af Amer: 29 mL/min — ABNORMAL LOW (ref >60)
GFR calc Af Amer: 42 mL/min — ABNORMAL LOW (ref >60)
GFR calc non Af Amer: 25 mL/min — ABNORMAL LOW (ref >60)
GFR calc non Af Amer: 36 mL/min — ABNORMAL LOW (ref >60)
GLUCOSE: 120 mg/dL — AB (ref 70–99)
Glucose, Bld: 120 mg/dL — ABNORMAL HIGH (ref 70–99)
Potassium: 4.6 mmol/L (ref 3.5–5.1)
Potassium: 4 mmol/L (ref 3.5–5.1)
Sodium: 151 mmol/L — ABNORMAL HIGH (ref 135–145)
Sodium: 152 mmol/L — ABNORMAL HIGH (ref 135–145)

## 2018-07-29 LAB — GLUCOSE, CAPILLARY
Glucose-Capillary: 113 mg/dL — ABNORMAL HIGH (ref 70–99)
Glucose-Capillary: 132 mg/dL — ABNORMAL HIGH (ref 70–99)
Glucose-Capillary: 87 mg/dL (ref 70–99)
Glucose-Capillary: 91 mg/dL (ref 70–99)

## 2018-07-29 LAB — VANCOMYCIN, RANDOM: Vancomycin Rm: <4

## 2018-07-29 MED ORDER — BACITRACIN-NEOMYCIN-POLYMYXIN OINTMENT TUBE
TOPICAL_OINTMENT | Freq: Every day | CUTANEOUS | Status: DC
  Administered 2018-07-29 – 2018-08-01 (×4): via TOPICAL
  Filled 2018-07-29: qty 14

## 2018-07-29 MED ORDER — CHLORHEXIDINE GLUCONATE CLOTH 2 % EX PADS
6.0000 | MEDICATED_PAD | Freq: Every day | CUTANEOUS | Status: DC
  Administered 2018-07-30 – 2018-08-01 (×2): 6 via TOPICAL

## 2018-07-29 MED ORDER — SODIUM CHLORIDE 0.9% FLUSH
10.0000 mL | Freq: Two times a day (BID) | INTRAVENOUS | Status: DC
  Administered 2018-07-29 – 2018-07-30 (×3): 10 mL
  Administered 2018-07-31: 20 mL
  Administered 2018-07-31 – 2018-08-01 (×2): 10 mL

## 2018-07-29 MED ORDER — SODIUM CHLORIDE 0.9% FLUSH: 10.0000 mL | INTRAVENOUS | Status: DC | PRN

## 2018-07-29 MED ORDER — JEVITY 1.2 CAL PO LIQD
1000.0000 mL | ORAL | Status: DC
  Administered 2018-07-29 – 2018-07-30 (×3)
  Administered 2018-07-31: 1000 mL
  Filled 2018-07-29 (×7): qty 1000

## 2018-07-29 MED ORDER — LIDOCAINE 4 % EX CREA
TOPICAL_CREAM | Freq: Once | CUTANEOUS | Status: AC
  Administered 2018-07-29 (×2): via TOPICAL
  Filled 2018-07-29: qty 5

## 2018-07-29 MED ORDER — MUPIROCIN 2 % EX OINT
1.0000 "application " | TOPICAL_OINTMENT | Freq: Two times a day (BID) | CUTANEOUS | Status: DC
  Administered 2018-07-29 – 2018-08-01 (×7): 1 via NASAL
  Filled 2018-07-29 (×5): qty 22

## 2018-07-29 MED ORDER — VANCOMYCIN HCL 10 G IV SOLR
1500.0000 mg | Freq: Once | INTRAVENOUS | Status: AC
  Administered 2018-07-29: 1500 mg via INTRAVENOUS
  Filled 2018-07-29 (×2): qty 1500

## 2018-07-29 MED ORDER — JEVITY 1.2 CAL PO LIQD: 1000.0000 mL | ORAL | Status: DC

## 2018-07-29 MED ORDER — LORAZEPAM 2 MG/ML IJ SOLN
1.0000 mg | Freq: Once | INTRAMUSCULAR | Status: AC
  Administered 2018-07-29: 1 mg via INTRAVENOUS
  Filled 2018-07-29: qty 1

## 2018-07-29 NOTE — Progress Notes (Addendum)
PROGRESS NOTE    Sean Collins   VEH:[id number]  DOB: Sep 25, 1950  DOA: 07/27/2018 PCP: [name], MD   Brief Narrative:  Sean Collins is a 68 year old male from SNF with CVA and left-sided weakness and a aphasia, dysphagia with tube feeds, diabetes type 2, hypertension, hyperlipidemia being followed by palliative care who presents to the hospital with a temperature of 101.2. Chest x-ray shows bibasilar atelectasis with poor inspiratory effort. Started on IV vancomycin and cefepime in the ED. Urinary retention also noted in the ED with 15 cc of urine obtained after straight cath. Noted to be hyperkalemic potassium greater than 7.5 with a creatinine of 11.60.  Last admitted to the hospital from 2/29 through 3/4 AKI, thought to be secondary to an aspiration pneumonia.  Subjective: Aphasic.No complaints. Called later because patient fell out of bed onto the left side. Per RN, no swelling noted anywhere, good ROM in left arm/hand. Has abrasions on forehead and right hand.   Assessment & Plan:   Principal Problem:   HCAP (healthcare-associated pneumonia) -? recurrent aspiration pneumonia-also being ruled out for coronavirus -Continue Maxipime - The patient's MRSA screen is positive and he is from SNF and therefore vancomycin will be continued - delay in antibiotics today as no IV access-after much conversations with IV team, RN and PCCM,  IV team will place Midline  Active Problems:   Hyperkalemia   Acute kidney injury due to acute urinary retention - Started Flomax and continue Foley catheter - Creatinine and potassium are improving - start Veltassa for hyperkalemia-  K improved and will stop now  Prior CVA -Continue aspirin and Lipitor, tube feeds and supportive care  Type 2 diabetes mellitus with diabetic nephropathy - Continue sliding scale insulin   Gout - Allopurinol    Time spent in minutes: 35 DVT prophylaxis:  Heparin Code Status: Full code  Family Communication: none  Disposition Plan: Return to SNF when COVID 19 is negative Consultants:   none Procedures:   none Antimicrobials:  Anti-infectives (From admission, onward)   Start     Dose/Rate Route Frequency Ordered Stop   07/29/18 0945  vancomycin (VANCOCIN) 1,500 mg in sodium chloride 0.9 % 500 mL IVPB     1,500 mg 250 mL/hr over 120 Minutes Intravenous  Once 07/29/18 0939     07/28/18 1800  ceFEPIme (MAXIPIME) 500 mg in dextrose 5 % 50 mL IVPB  Status:  Discontinued     500 mg 100 mL/hr over 30 Minutes Intravenous Every 24 hours 07/27/18 1905 07/28/18 1335   07/28/18 1800  ceFEPIme (MAXIPIME) 1 g in sodium chloride 0.9 % 100 mL IVPB     1 g 200 mL/hr over 30 Minutes Intravenous Every 24 hours 07/28/18 1335 08/05/18 1759   07/27/18 1910  vancomycin variable dose per unstable renal function (pharmacist dosing)      Does not apply See admin instructions 07/27/18 1910     07/27/18 1420  ceFEPIme (MAXIPIME) 2 g in sodium chloride 0.9 % 100 mL IVPB     2 g 200 mL/hr over 30 Minutes Intravenous  Once 07/27/18 1359 07/27/18 1851   07/27/18 1415  vancomycin (VANCOCIN) 1,500 mg in sodium chloride 0.9 % 500 mL IVPB     1,500 mg 250 mL/hr over 120 Minutes Intravenous  Once 07/27/18 1410 07/27/18 1737   07/27/18 1315  cefTRIAXone (ROCEPHIN) 2 g in sodium chloride 0.9 % 100 mL IVPB  Status:  Discontinued     2 g 200 mL/hr  over 30 Minutes Intravenous Every 24 hours 07/27/18 1301 07/27/18 1359   07/27/18 1315  azithromycin (ZITHROMAX) 500 mg in sodium chloride 0.9 % 250 mL IVPB  Status:  Discontinued     500 mg 250 mL/hr over 60 Minutes Intravenous Every 24 hours 07/27/18 1301 07/27/18 1359       Objective: Vitals:   07/29/18 0400 07/29/18 0800 07/29/18 1100 07/29/18 1349  BP: (!) 152/91 (!) 154/87 (!) 156/92 131/81  Pulse: 82  95   Resp:  (!) 22 15   Temp: 98.1 F (36.7 C) 98 F (36.7 C) 98 F (36.7 C)   TempSrc: Oral Oral Oral   SpO2: 99%     Weight:      Height:         Intake/Output Summary (Last 24 hours) at 07/29/2018 1524 Last data filed at 07/29/2018 0500 Gross per 24 hour  Intake 1001.44 ml  Output 2115 ml  Net -1113.56 ml   Filed Weights   07/27/18 1400 07/27/18 2239  Weight: 80.5 kg 80.5 kg    Examination: General exam: Appears comfortable  HEENT: PERRLA, oral mucosa moist, no sclera icterus or thrush Respiratory system: Clear to auscultation. Respiratory effort normal. Cardiovascular system: S1 & S2 heard,  No murmurs  Gastrointestinal system: Abdomen soft, non-tender, nondistended. Normal bowel sounds  - PEG tube present and intact Central nervous system: Alert and oriented.aphasic, unable to move left side Extremities: No cyanosis, clubbing or edema Skin: No rashes or ulcers   Data Reviewed: I have personally reviewed following labs and imaging studies  CBC: Recent Labs  Lab 07/27/18 1314 07/28/18 0316  WBC 11.4* 13.1*  NEUTROABS 10.1*  --   HGB 9.9* 10.7*  HCT 32.2* 33.7*  MCV 91.2 91.1  PLT 134* 222*   Basic Metabolic Panel: Recent Labs  Lab 07/27/18 1836 07/27/18 2331 07/28/18 0434 07/28/18 1918 07/29/18 0807  NA 141 143 141 147* 151*  K 6.9* 7.0* 7.2* 4.6 4.6  CL 111 113* 113* 117* 117*  CO2 18* 17* 17* 21* 21*  GLUCOSE 275* 156* 141* 156* 120*  BUN 121* 111* 110* 86* 78*  CREATININE 9.53* 7.78* 6.74* 3.53* 2.58*  CALCIUM 10.0 10.0 10.3 9.7 9.7   GFR: Estimated Creatinine Clearance: 27.8 mL/min (A) (by C-G formula based on SCr of 2.58 mg/dL (H)). Liver Function Tests: Recent Labs  Lab 07/27/18 1314  AST 43*  ALT 26  ALKPHOS 78  BILITOT 0.4  PROT 7.0  ALBUMIN 2.0*   No results for input(s): LIPASE, AMYLASE in the last 168 hours. No results for input(s): AMMONIA in the last 168 hours. Coagulation Profile: No results for input(s): INR, PROTIME in the last 168 hours. Cardiac Enzymes: No results for input(s): CKTOTAL, CKMB, CKMBINDEX, TROPONINI in the last 168 hours. BNP (last 3 results) No  results for input(s): PROBNP in the last 8760 hours. HbA1C: Recent Labs    07/27/18 1836  HGBA1C 6.8*   CBG: Recent Labs  Lab 07/28/18 1214 07/28/18 1600 07/28/18 2201 07/29/18 0820 07/29/18 1237  GLUCAP 155* 140* 151* 113* 132*   Lipid Profile: No results for input(s): CHOL, HDL, LDLCALC, TRIG, CHOLHDL, LDLDIRECT in the last 72 hours. Thyroid Function Tests: No results for input(s): TSH, T4TOTAL, FREET4, T3FREE, THYROIDAB in the last 72 hours. Anemia Panel: No results for input(s): VITAMINB12, FOLATE, FERRITIN, TIBC, IRON, RETICCTPCT in the last 72 hours. Urine analysis:    Component Value Date/Time   COLORURINE YELLOW 07/27/2018 [place] 07/27/2018 1550  LABSPEC 1.012 07/27/2018 1550   PHURINE 7.0 07/27/2018 [place] 07/27/2018 1550   HGBUR NEGATIVE 07/27/2018 [place] 07/27/2018 [place] 07/27/2018 1550   PROTEINUR NEGATIVE 07/27/2018 1550   UROBILINOGEN 1.0 09/21/2008 1431   NITRITE NEGATIVE 07/27/2018 1550   LEUKOCYTESUR NEGATIVE 07/27/2018 1550   Sepsis Labs: @LABRCNTIP (procalcitonin:4,lacticidven:4) ) Recent Results (from the past 240 hour(s))  Culture, sputum-assessment     Status: None   Collection Time: 07/27/18 12:47 AM  Result Value Ref Range Status   Specimen Description SPUTUM  Final   Special Requests NONE  Final   Sputum evaluation   Final    Sputum specimen not acceptable for testing.  Please recollect.   RESULT CALLED TO, READ BACK BY AND VERIFIED WITH: GRUBEY AT 0700 ON [id number] BY SJW Performed at [facility], [place] [address] [place], [place] [address]    Report Status 07/28/2018 FINAL  Final  Blood Culture (routine x 2)     Status: None (Preliminary result)   Collection Time: 07/27/18  1:01 PM  Result Value Ref Range Status   Specimen Description BLOOD RIGHT WRIST  Final   Special Requests   Final    BOTTLES DRAWN AEROBIC AND ANAEROBIC Blood Culture adequate  volume   Culture   Final    NO GROWTH 2 DAYS Performed at [facility], [place] [address], [place], [place] [address]    Report Status PENDING  Incomplete  Blood Culture (routine x 2)     Status: None (Preliminary result)   Collection Time: 07/27/18  1:42 PM  Result Value Ref Range Status   Specimen Description BLOOD BLOOD RIGHT FOREARM  Final   Special Requests   Final    BOTTLES DRAWN AEROBIC ONLY Blood Culture results may not be optimal due to an inadequate volume of blood received in culture bottles   Culture   Final    NO GROWTH 2 DAYS Performed at [facility], [place] [address], [place], [place] [address]    Report Status PENDING  Incomplete  Urine culture     Status: None   Collection Time: 07/27/18  2:01 PM  Result Value Ref Range Status   Specimen Description URINE, RANDOM  Final   Special Requests NONE  Final   Culture   Final    NO GROWTH Performed at [facility], [address] [place],  [address]    Report Status 07/28/2018 FINAL  Final  Respiratory Panel by PCR     Status: None   Collection Time: 07/27/18  3:31 PM  Result Value Ref Range Status   Adenovirus NOT DETECTED NOT DETECTED Final   Coronavirus 229E NOT DETECTED NOT DETECTED Final    Comment: (NOTE) The Coronavirus on the Respiratory Panel, DOES NOT test for the novel  Coronavirus (2019 nCoV)    Coronavirus HKU1 NOT DETECTED NOT DETECTED Final   Coronavirus NL63 NOT DETECTED NOT DETECTED Final   Coronavirus OC43 NOT DETECTED NOT DETECTED Final   Metapneumovirus NOT DETECTED NOT DETECTED Final   Rhinovirus / Enterovirus NOT DETECTED NOT DETECTED Final   Influenza A NOT DETECTED NOT DETECTED Final   Influenza B NOT DETECTED NOT DETECTED Final   Parainfluenza Virus 1 NOT DETECTED NOT DETECTED Final   Parainfluenza Virus 2 NOT DETECTED NOT DETECTED Final   Parainfluenza Virus 3 NOT DETECTED NOT DETECTED Final   Parainfluenza Virus 4 NOT DETECTED NOT DETECTED Final  Respiratory  Syncytial Virus NOT DETECTED NOT DETECTED Final   Bordetella pertussis NOT DETECTED NOT DETECTED Final   Chlamydophila pneumoniae NOT DETECTED NOT DETECTED Final   Mycoplasma pneumoniae NOT DETECTED NOT DETECTED Final    Comment: Performed at [facility], [place] [address], [place], [place] [address]  MRSA PCR Screening     Status: Abnormal   Collection Time: 07/28/18 12:26 AM  Result Value Ref Range Status   MRSA by PCR POSITIVE (A) NEGATIVE Final    Comment:        The GeneXpert MRSA Assay (FDA approved for NASAL specimens only), is one component of a comprehensive MRSA colonization surveillance program. It is not intended to diagnose MRSA infection nor to guide or monitor treatment for MRSA infections. RESULT CALLED TO, READ BACK BY AND VERIFIED WITH: JACOBSEN,V RN [id number] 07/28/2018 MITCHELL,L Performed at [facility] [address] [place], [place] [address]          Radiology Studies: No results found.    Scheduled Meds: . acetaminophen  650 mg Per Tube BID  . allopurinol  150 mg Per Tube Daily  . aspirin  325 mg Per Tube Daily  . atorvastatin  80 mg Per Tube QHS  . [START ON 07/30/2018] Chlorhexidine Gluconate Cloth  6 each Topical Q0600  . gabapentin  100 mg Oral QHS  . heparin injection (subcutaneous)  5,000 Units Subcutaneous Q8H  . insulin aspart  0-9 Units Subcutaneous QID  . mupirocin ointment  1 application Nasal BID  . neomycin-bacitracin-polymyxin   Topical Daily  . QUEtiapine  25 mg Per Tube BID  . ranitidine  75 mg Per Tube Q12H  . scopolamine  1 patch Transdermal Q72H  . sodium chloride flush  10-40 mL Intracatheter Q12H  . tamsulosin  0.4 mg Oral Daily  . valproic acid  125 mg Per Tube QHS  . vancomycin variable dose per unstable renal function (pharmacist dosing)   Does not apply See admin instructions   Continuous Infusions: . sodium chloride    . ceFEPime (MAXIPIME) IV 1 g (07/28/18 1827)  . feeding supplement (JEVITY 1.2 CAL) 80  mL/hr at 07/29/18 1457  . vancomycin       LOS: 2 days      Debbe Odea, MD [facility] Pager: [email or web address] Password [email or web address] 07/29/2018, 3:24 PM

## 2018-07-29 NOTE — Progress Notes (Signed)
Spoke with patients Nurse as well as messaged MD Rizwan, at this time the patient has had 4 PIV's in two days and the patient still requires IV antibiotics as well as other IV medications. I recommend a central line for this patient. At this time the patients kidney function is not appropriate for a midline or PICC line, unless cleared by the MD.

## 2018-07-29 NOTE — Progress Notes (Signed)
Called Nadine RN and she was unable to answer her phone due to being in a contact room, so I reached back out to MD Rizwan. I explained that IV team was unable to place a midline due to the patients low creatinine clearance. I recommended a central line for the patient if the patient needs continuous IV fluids and ABTs at this time.

## 2018-07-29 NOTE — Progress Notes (Addendum)
Initial Nutrition Assessment  DOCUMENTATION CODES:   Not applicable  INTERVENTION:    Increase Jevity 1.2 by 10 ml every 4 hours to goal rate of 80 ml/hr  Provides 2304 kcals, 106 gm protein, 1549 ml free water daily  NUTRITION DIAGNOSIS:   Inadequate oral intake related to (chronic dysphagia) as evidenced by NPO status  GOAL:   Patient will meet greater than or equal to 90% of their needs  MONITOR:   TF tolerance, Labs, Skin, Weight trends, I & O's  REASON FOR ASSESSMENT:   Consult Enteral/tube feeding initiation and management  ASSESSMENT:    68 y.o. Male with history of CVA, type 2 diabetes mellitus, hypertension, hyperlipidemia, dysarthria, dysphagia, renal insufficiency presented via EMS from [facility] for evaluation of acute onset of fever, cough, shortness of breath.    Pt admitted with sepsis, AKI and anemia. He is from [facility]. Pt is nonverbal at baseline.   Pt typically receives Isosource 1.5 (Nestle product) at 65 ml/hr via PEG tube. Jevity 1.2 [facility] formulary equivalent, Abbott product) started 3/22. Jevity 1.2 formula currently infusing at 40 ml/hr via tube.  RD to adjust Jevity 1.2 rate to provide similar nutrition pt receives at [facility]. Medications include MOM, zantac & ABX. Labs reviewed. Na 151 (H).  CBG's [id number].  NUTRITION - FOCUSED PHYSICAL EXAM:  Unable to assess at this time  Diet Order:   Diet Order            Diet NPO time specified  Diet effective now             EDUCATION NEEDS:   Not appropriate for education at this time  Skin:  Skin Assessment: Reviewed RN Assessment  Last BM:  3/23  Height:   Ht Readings from Last 1 Encounters:  07/27/18 5\' 9"  (1.753 m)   Weight:   Wt Readings from Last 1 Encounters:  07/27/18 80.5 kg    Estimated Nutritional Needs:   Kcal:  2200-2400  Protein:  105-120 gm  Fluid:  2.2-2.4 L  Arthur Holms, RD, LDN Pager #: [phone] After-Hours Pager #:  [phone]

## 2018-07-29 NOTE — Progress Notes (Signed)
Pharmacy Antibiotic Note  Sean Collins is a 68 y.o. male admitted on 07/27/2018 with fever, cough and SOB.  Pharmacy has been consulted for vancomycin dosing for PNA.   Cefepime to continue as well   Plan: Vanc 1500mg  IV x 1 Check random vanc level this am to for redose Cefepime 1gm IV q24 hours   Height: 5\' 9"  (175.3 cm) Weight: 177 lb 7.5 oz (80.5 kg) IBW/kg (Calculated) : 70.7  Temp (24hrs), Avg:97 F (36.1 C), Min:96 F (35.6 C), Max:98.2 F (36.8 C)  Recent Labs  Lab 07/27/18 1314 07/27/18 1326 07/27/18 1836 07/27/18 2331 07/28/18 0316 07/28/18 0434 07/28/18 1918  WBC 11.4*  --   --   --  13.1*  --   --   CREATININE 11.60*  --  9.53* 7.78*  --  6.74* 3.53*  LATICACIDVEN  --  1.5  --   --   --   --   --     Estimated Creatinine Clearance: 20.3 mL/min (A) (by C-G formula based on SCr of 3.53 mg/dL (H)).    No Known Allergies    Vanc 3/21 >> Cefepime 3/21>>  3/21 BCx - ngtd 3/22  MRSA pcr (+) 3/22  UC>>ng final  Thanks for allowing pharmacy to be a part of this patient's care.  Excell Seltzer, PharmD Clinical Pharmacist  07/29/2018, 5:35 AM

## 2018-07-30 LAB — BASIC METABOLIC PANEL
Anion gap: 12 (ref 5–15)
BUN: 55 mg/dL — ABNORMAL HIGH (ref 8–23)
CO2: 21 mmol/L — ABNORMAL LOW (ref 22–32)
Calcium: 9.4 mg/dL (ref 8.9–10.3)
Chloride: 121 mmol/L — ABNORMAL HIGH (ref 98–111)
Creatinine, Ser: 1.55 mg/dL — ABNORMAL HIGH (ref 0.61–1.24)
GFR calc Af Amer: 53 mL/min — ABNORMAL LOW (ref >60)
GFR calc non Af Amer: 46 mL/min — ABNORMAL LOW (ref >60)
Glucose, Bld: 132 mg/dL — ABNORMAL HIGH (ref 70–99)
Potassium: 4.2 mmol/L (ref 3.5–5.1)
Sodium: 154 mmol/L — ABNORMAL HIGH (ref 135–145)

## 2018-07-30 LAB — GLUCOSE, CAPILLARY
GLUCOSE-CAPILLARY: 102 mg/dL — AB (ref 70–99)
GLUCOSE-CAPILLARY: 109 mg/dL — AB (ref 70–99)
Glucose-Capillary: 80 mg/dL (ref 70–99)
Glucose-Capillary: 116 mg/dL — ABNORMAL HIGH (ref 70–99)
Glucose-Capillary: 133 mg/dL — ABNORMAL HIGH (ref 70–99)
Glucose-Capillary: 95 mg/dL (ref 70–99)

## 2018-07-30 MED ORDER — VANCOMYCIN HCL 10 G IV SOLR
1250.0000 mg | INTRAVENOUS | Status: DC
  Administered 2018-07-30: 1250 mg via INTRAVENOUS
  Filled 2018-07-30 (×2): qty 1250

## 2018-07-30 MED ORDER — FREE WATER
200.0000 mL | Freq: Four times a day (QID) | Status: DC
  Administered 2018-07-30 – 2018-08-01 (×7): 200 mL

## 2018-07-30 NOTE — Progress Notes (Signed)
PROGRESS NOTE    Sean Collins   MIW:[id number]  DOB: [birth year]  DOA: 07/27/2018 PCP: [name], MD   Brief Narrative:  Sean Collins is a 68 year old male from SNF with CVA and left-sided weakness and a aphasia, dysphagia with tube feeds, diabetes type 2, hypertension, hyperlipidemia being followed by palliative care who presents to the hospital with a temperature of 101.2. Chest x-ray shows bibasilar atelectasis with poor inspiratory effort. Started on IV vancomycin and cefepime in the ED. Urinary retention also noted in the ED with 15 cc of urine obtained after straight cath. Noted to be hyperkalemic potassium greater than 7.5 with a creatinine of 11.60.  Last admitted to the hospital from 2/29 through 3/4 AKI, thought to be secondary to an aspiration pneumonia.  Subjective: Aphasic. No complaints.   Assessment & Plan:   Principal Problem:   HCAP (healthcare-associated pneumonia) -? recurrent aspiration pneumonia-also being ruled out for coronavirus -Continue Maxipime and Vancomycin - The patient's MRSA screen is positive and he is from SNF and therefore vancomycin will be continued   Active Problems:   Hyperkalemia   Acute kidney injury due to acute urinary retention - Started Flomax-  Foley placed - Creatinine and potassium are improving - started Veltassa for hyperkalemia - will do voiding trial today  Hypernatremia - likely due Normal saline given earlier on in the admission- add free water flushes today  Prior CVA -Continue aspirin and Lipitor, tube feeds and supportive care  Type 2 diabetes mellitus with diabetic nephropathy - Continue sliding scale insulin  Gout - Allopurinol    Time spent in minutes: 35 DVT prophylaxis:  Heparin Code Status: Full code Family Communication: none  Disposition Plan: Return to SNF when COVID 19 is negative- result should be back tomorrow Consultants:   none Procedures:   none Antimicrobials:   Anti-infectives (From admission, onward)   Start     Dose/Rate Route Frequency Ordered Stop   07/30/18 1600  vancomycin (VANCOCIN) 1,250 mg in sodium chloride 0.9 % 250 mL IVPB     1,250 mg 166.7 mL/hr over 90 Minutes Intravenous Every 24 hours 07/30/18 1417     07/29/18 0945  vancomycin (VANCOCIN) 1,500 mg in sodium chloride 0.9 % 500 mL IVPB     1,500 mg 250 mL/hr over 120 Minutes Intravenous  Once 07/29/18 0939 07/29/18 1835   07/28/18 1800  ceFEPIme (MAXIPIME) 500 mg in dextrose 5 % 50 mL IVPB  Status:  Discontinued     500 mg 100 mL/hr over 30 Minutes Intravenous Every 24 hours 07/27/18 1905 07/28/18 1335   07/28/18 1800  ceFEPIme (MAXIPIME) 1 g in sodium chloride 0.9 % 100 mL IVPB     1 g 200 mL/hr over 30 Minutes Intravenous Every 24 hours 07/28/18 1335 08/05/18 1759   07/27/18 1910  vancomycin variable dose per unstable renal function (pharmacist dosing)      Does not apply See admin instructions 07/27/18 1910     07/27/18 1420  ceFEPIme (MAXIPIME) 2 g in sodium chloride 0.9 % 100 mL IVPB     2 g 200 mL/hr over 30 Minutes Intravenous  Once 07/27/18 1359 07/27/18 1851   07/27/18 1415  vancomycin (VANCOCIN) 1,500 mg in sodium chloride 0.9 % 500 mL IVPB     1,500 mg 250 mL/hr over 120 Minutes Intravenous  Once 07/27/18 1410 07/27/18 1737   07/27/18 1315  cefTRIAXone (ROCEPHIN) 2 g in sodium chloride 0.9 % 100 mL IVPB  Status:  Discontinued  2 g 200 mL/hr over 30 Minutes Intravenous Every 24 hours 07/27/18 1301 07/27/18 1359   07/27/18 1315  azithromycin (ZITHROMAX) 500 mg in sodium chloride 0.9 % 250 mL IVPB  Status:  Discontinued     500 mg 250 mL/hr over 60 Minutes Intravenous Every 24 hours 07/27/18 1301 07/27/18 1359       Objective: Vitals:   07/29/18 2352 07/30/18 0402 07/30/18 0800 07/30/18 0805  BP: 122/80  (!) 165/97 (!) 159/91  Pulse: 79   (!) 50  Resp: 18   (!) 25  Temp: 97.8 F (36.6 C)  97.9 F (36.6 C)   TempSrc: Axillary  Axillary   SpO2: 97%   100%   Weight:  78 kg    Height:        Intake/Output Summary (Last 24 hours) at 07/30/2018 1441 Last data filed at 07/30/2018 0800 Gross per 24 hour  Intake 2407.27 ml  Output 2000 ml  Net 407.27 ml   Filed Weights   07/27/18 1400 07/27/18 2239 07/30/18 0402  Weight: 80.5 kg 80.5 kg 78 kg    Examination: General exam: Appears comfortable  HEENT: PERRLA, oral mucosa moist, no sclera icterus or thrush Respiratory system: Clear to auscultation. Respiratory effort normal. Cardiovascular system: S1 & S2 heard,  No murmurs  Gastrointestinal system: Abdomen soft, non-tender, nondistended. Normal bowel sounds  - PEG tube intact Central nervous system: Alert - nonverbal- flaccid left side Extremities: No cyanosis, clubbing or edema Skin: No rashes or ulcers     Data Reviewed: I have personally reviewed following labs and imaging studies  CBC: Recent Labs  Lab 07/27/18 1314 07/28/18 0316  WBC 11.4* 13.1*  NEUTROABS 10.1*  --   HGB 9.9* 10.7*  HCT 32.2* 33.7*  MCV 91.2 91.1  PLT 134* 482*   Basic Metabolic Panel: Recent Labs  Lab 07/28/18 0434 07/28/18 1918 07/29/18 0807 07/29/18 1850 07/30/18 0734  NA 141 147* 151* 152* 154*  K 7.2* 4.6 4.6 4.0 4.2  CL 113* 117* 117* 120* 121*  CO2 17* 21* 21* 22 21*  GLUCOSE 141* 156* 120* 120* 132*  BUN 110* 86* 78* 67* 55*  CREATININE 6.74* 3.53* 2.58* 1.89* 1.55*  CALCIUM 10.3 9.7 9.7 9.6 9.4   GFR: Estimated Creatinine Clearance: 46.2 mL/min (A) (by C-G formula based on SCr of 1.55 mg/dL (H)). Liver Function Tests: Recent Labs  Lab 07/27/18 1314  AST 43*  ALT 26  ALKPHOS 78  BILITOT 0.4  PROT 7.0  ALBUMIN 2.0*   No results for input(s): LIPASE, AMYLASE in the last 168 hours. No results for input(s): AMMONIA in the last 168 hours. Coagulation Profile: No results for input(s): INR, PROTIME in the last 168 hours. Cardiac Enzymes: No results for input(s): CKTOTAL, CKMB, CKMBINDEX, TROPONINI in the last 168 hours. BNP (last  3 results) No results for input(s): PROBNP in the last 8760 hours. HbA1C: Recent Labs    07/27/18 1836  HGBA1C 6.8*   CBG: Recent Labs  Lab 07/29/18 2204 07/30/18 0122 07/30/18 0400 07/30/18 0933 07/30/18 1241  GLUCAP 91 80 116* 102* 133*   Lipid Profile: No results for input(s): CHOL, HDL, LDLCALC, TRIG, CHOLHDL, LDLDIRECT in the last 72 hours. Thyroid Function Tests: No results for input(s): TSH, T4TOTAL, FREET4, T3FREE, THYROIDAB in the last 72 hours. Anemia Panel: No results for input(s): VITAMINB12, FOLATE, FERRITIN, TIBC, IRON, RETICCTPCT in the last 72 hours. Urine analysis:    Component Value Date/Time   COLORURINE YELLOW 07/27/2018 1550   APPEARANCEUR  CLEAR 07/27/2018 1550   LABSPEC 1.012 07/27/2018 1550   PHURINE 7.0 07/27/2018 [place] 07/27/2018 [place] 07/27/2018 [place] 07/27/2018 [place] 07/27/2018 1550   PROTEINUR NEGATIVE 07/27/2018 1550   UROBILINOGEN 1.0 09/21/2008 1431   NITRITE NEGATIVE 07/27/2018 1550   LEUKOCYTESUR NEGATIVE 07/27/2018 1550   Sepsis Labs: @LABRCNTIP (procalcitonin:4,lacticidven:4) ) Recent Results (from the past 240 hour(s))  Culture, sputum-assessment     Status: None   Collection Time: 07/27/18 12:47 AM  Result Value Ref Range Status   Specimen Description SPUTUM  Final   Special Requests NONE  Final   Sputum evaluation   Final    Sputum specimen not acceptable for testing.  Please recollect.   RESULT CALLED TO, READ BACK BY AND VERIFIED WITH: GRUBEY AT 0700 ON [id number] BY SJW Performed at [facility], [place] [address] [place], [place] [address]    Report Status 07/28/2018 FINAL  Final  Blood Culture (routine x 2)     Status: None (Preliminary result)   Collection Time: 07/27/18  1:01 PM  Result Value Ref Range Status   Specimen Description BLOOD RIGHT WRIST  Final   Special Requests   Final    BOTTLES DRAWN AEROBIC AND ANAEROBIC Blood Culture  adequate volume   Culture   Final    NO GROWTH 3 DAYS Performed at [facility], [place] [address] [place], [place] [address]    Report Status PENDING  Incomplete  Blood Culture (routine x 2)     Status: None (Preliminary result)   Collection Time: 07/27/18  1:42 PM  Result Value Ref Range Status   Specimen Description BLOOD BLOOD RIGHT FOREARM  Final   Special Requests   Final    BOTTLES DRAWN AEROBIC ONLY Blood Culture results may not be optimal due to an inadequate volume of blood received in culture bottles   Culture   Final    NO GROWTH 3 DAYS Performed at [facility], [place] [address] [place], [place] [address]    Report Status PENDING  Incomplete  Urine culture     Status: None   Collection Time: 07/27/18  2:01 PM  Result Value Ref Range Status   Specimen Description URINE, RANDOM  Final   Special Requests NONE  Final   Culture   Final    NO GROWTH Performed at [facility], [address] [place], [place] [address]    Report Status 07/28/2018 FINAL  Final  Respiratory Panel by PCR     Status: None   Collection Time: 07/27/18  3:31 PM  Result Value Ref Range Status   Adenovirus NOT DETECTED NOT DETECTED Final   Coronavirus 229E NOT DETECTED NOT DETECTED Final    Comment: (NOTE) The Coronavirus on the Respiratory Panel, DOES NOT test for the novel  Coronavirus (2019 nCoV)    Coronavirus HKU1 NOT DETECTED NOT DETECTED Final   Coronavirus NL63 NOT DETECTED NOT DETECTED Final   Coronavirus OC43 NOT DETECTED NOT DETECTED Final   Metapneumovirus NOT DETECTED NOT DETECTED Final   Rhinovirus / Enterovirus NOT DETECTED NOT DETECTED Final   Influenza A NOT DETECTED NOT DETECTED Final   Influenza B NOT DETECTED NOT DETECTED Final   Parainfluenza Virus 1 NOT DETECTED NOT DETECTED Final   Parainfluenza Virus 2 NOT DETECTED NOT DETECTED Final   Parainfluenza Virus 3 NOT DETECTED NOT DETECTED Final   Parainfluenza Virus 4 NOT  DETECTED NOT DETECTED Final    Respiratory Syncytial Virus NOT DETECTED NOT DETECTED Final   Bordetella pertussis NOT DETECTED NOT DETECTED Final   Chlamydophila pneumoniae NOT DETECTED NOT DETECTED Final   Mycoplasma pneumoniae NOT DETECTED NOT DETECTED Final    Comment: Performed at [facility], [place] [address] [place], [place] [address]  MRSA PCR Screening     Status: Abnormal   Collection Time: 07/28/18 12:26 AM  Result Value Ref Range Status   MRSA by PCR POSITIVE (A) NEGATIVE Final    Comment:        The GeneXpert MRSA Assay (FDA approved for NASAL specimens only), is one component of a comprehensive MRSA colonization surveillance program. It is not intended to diagnose MRSA infection nor to guide or monitor treatment for MRSA infections. RESULT CALLED TO, READ BACK BY AND VERIFIED WITH: JACOBSEN,V RN [id number] 07/28/2018 MITCHELL,L Performed at [place] [address] [place], [place] [address]          Radiology Studies: No results found.    Scheduled Meds: . acetaminophen  650 mg Per Tube BID  . allopurinol  150 mg Per Tube Daily  . aspirin  325 mg Per Tube Daily  . atorvastatin  80 mg Per Tube QHS  . Chlorhexidine Gluconate Cloth  6 each Topical Q0600  . gabapentin  100 mg Oral QHS  . heparin injection (subcutaneous)  5,000 Units Subcutaneous Q8H  . insulin aspart  0-9 Units Subcutaneous QID  . mupirocin ointment  1 application Nasal BID  . neomycin-bacitracin-polymyxin   Topical Daily  . QUEtiapine  25 mg Per Tube BID  . ranitidine  75 mg Per Tube Q12H  . scopolamine  1 patch Transdermal Q72H  . sodium chloride flush  10-40 mL Intracatheter Q12H  . tamsulosin  0.4 mg Oral Daily  . valproic acid  125 mg Per Tube QHS  . vancomycin variable dose per unstable renal function (pharmacist dosing)   Does not apply See admin instructions   Continuous Infusions: . sodium chloride    . ceFEPime (MAXIPIME) IV 1 g (07/29/18 2012)  . feeding supplement (JEVITY 1.2 CAL) 80 mL/hr at  07/30/18 0947  . vancomycin       LOS: 3 days      Debbe Odea, MD [facility] Pager: [email or web address] Password [facility] 07/30/2018, 2:41 PM

## 2018-07-30 NOTE — NC FL2 (Signed)
[place] LEVEL OF CARE SCREENING TOOL     IDENTIFICATION  Patient Name: Sean Collins Birthdate: 08-Apr-1951 Sex: male Admission Date (Current Location): 07/27/2018  [place] and [place] Number:  Kathleen Argue [id number] [place] and Address:  [facility], [place] [address] [place], [place] [address]      Provider Number: [id number]  Attending Physician Name and Address:  Debbe Odea, MD  Relative Name and Phone Number:  Everhett Bozard - brother, [phone]     Current Level of Care: Hospital Recommended Level of Care: [place] Prior Approval Number:    Date Approved/Denied:   PASRR Number:    Discharge Plan: SNF    Current Diagnoses: Patient Active Problem List   Diagnosis Date Noted  . Hyperkalemia 07/27/2018  . Acute kidney injury ([place]) 07/27/2018  . Aphasia   . AKI (acute kidney injury) ([place]) 07/06/2018  . Neoplasm of skin of abdomen 04/23/2018  . Renal insufficiency 09/22/2016  . Type 2 diabetes mellitus with diabetic nephropathy, without long-term current use of insulin ([place]) 09/13/2016  . Depressive psychosis ([place]) 09/13/2016  . Cervical myofascial pain syndrome 08/29/2016  . Cervical facet joint syndrome 08/29/2016  . Recurrent falls 07/11/2016  . HCAP (healthcare-associated pneumonia) 07/05/2016  . Major depressive disorder, recurrent episode ([place]) 04/17/2016  . Shoulder pain, left 02/07/2016  . Essential hypertension 12/08/2015  . Late effects of CVA (cerebrovascular accident) 12/08/2015  . Cerebrovascular accident (CVA) due to thrombosis of right middle cerebral artery ([place])   . Slurred speech   . Malnutrition of moderate degree 11/04/2015  . Left hemiparesis ([place])   . Dysphagia   . Hyperlipidemia 11/16/2011  . Gout 11/16/2011  . History of stroke 11/16/2011  . CAD (coronary artery disease) 11/16/2011    Orientation RESPIRATION BLADDER Height & Weight     Self  Normal Continent Weight: 171  lb 14.4 oz (78 kg) Height:  5\' 9"  (175.3 cm)  BEHAVIORAL SYMPTOMS/MOOD NEUROLOGICAL BOWEL NUTRITION STATUS      Incontinent Diet, Feeding tube(peg tube)  AMBULATORY STATUS COMMUNICATION OF NEEDS Skin   Limited Assist Does not communicate Normal                       Personal Care Assistance Level of Assistance  Bathing, Feeding, Dressing Bathing Assistance: Limited assistance Feeding assistance: Independent Dressing Assistance: Limited assistance     Functional Limitations Info  Sight, Hearing, Speech Sight Info: Adequate Hearing Info: Adequate Speech Info: Adequate    SPECIAL CARE FACTORS FREQUENCY  PT (By licensed PT), OT (By licensed OT)                    Contractures Contractures Info: Not present    Additional Factors Info  Code Status, Allergies, Isolation Precautions Code Status Info: Full Code Allergies Info: No known allergies     Isolation Precautions Info: Droplet/Contact precautions; rule out COVID-19     Current Medications (07/30/2018):  This is the current hospital active medication list Current Facility-Administered Medications  Medication Dose Route Frequency Provider Last Rate Last Dose  . 0.9 %  sodium chloride infusion   Intravenous PRN Noemi Chapel, MD      . acetaminophen (TYLENOL) tablet 650 mg  650 mg Per Tube BID Shelda Pal, DO   650 mg at 07/30/18 0940  . allopurinol (ZYLOPRIM) tablet 150 mg  150 mg Per Tube Daily Shelda Pal, DO   150 mg at 07/30/18 0940  .  aspirin tablet 325 mg  325 mg Per Tube Daily Shelda Pal, DO   325 mg at 07/30/18 0940  . atorvastatin (LIPITOR) tablet 80 mg  80 mg Per Tube QHS Shelda Pal, DO   80 mg at 07/29/18 2237  . ceFEPIme (MAXIPIME) 1 g in sodium chloride 0.9 % 100 mL IVPB  1 g Intravenous Q24H Rizwan, Eunice Blase, MD 200 mL/hr at 07/29/18 2012 1 g at 07/29/18 2012  . Chlorhexidine Gluconate Cloth 2 % PADS 6 each  6 each Topical Q0600 Debbe Odea, MD   6  each at 07/30/18 0545  . feeding supplement (JEVITY 1.2 CAL) liquid 1,000 mL  1,000 mL Per Tube Continuous Debbe Odea, MD 80 mL/hr at 07/30/18 0947    . gabapentin (NEURONTIN) capsule 100 mg  100 mg Oral QHS Rizwan, Eunice Blase, MD   100 mg at 07/29/18 2238  . heparin injection 5,000 Units  5,000 Units Subcutaneous Q8H Shelda Pal, DO   5,000 Units at 07/30/18 0543  . insulin aspart (novoLOG) injection 0-9 Units  0-9 Units Subcutaneous QID Debbe Odea, MD   1 Units at 07/29/18 1411  . magnesium hydroxide (MILK OF MAGNESIA) suspension 30 mL  30 mL Per Tube Daily PRN Shelda Pal, DO      . mupirocin ointment (BACTROBAN) 2 % 1 application  1 application Nasal BID Debbe Odea, MD   1 application at 37/90/24 0941  . neomycin-bacitracin-polymyxin (NEOSPORIN) ointment   Topical Daily Rizwan, Eunice Blase, MD      . QUEtiapine (SEROQUEL) tablet 25 mg  25 mg Per Tube BID Shelda Pal, DO   25 mg at 07/30/18 0940  . ranitidine (ZANTAC) 150 MG/10ML syrup 75 mg  75 mg Per Tube Q12H Shelda Pal, DO   75 mg at 07/30/18 0940  . scopolamine (TRANSDERM-SCOP) 1 MG/3DAYS 1.5 mg  1 patch Transdermal Q72H Shelda Pal, DO   1.5 mg at 07/27/18 2318  . sodium chloride flush (NS) 0.9 % injection 10-40 mL  10-40 mL Intracatheter Q12H Debbe Odea, MD   10 mL at 07/29/18 2239  . sodium chloride flush (NS) 0.9 % injection 10-40 mL  10-40 mL Intracatheter PRN Debbe Odea, MD      . tamsulosin (FLOMAX) capsule 0.4 mg  0.4 mg Oral Daily Einar Grad, RPH   0.4 mg at 07/30/18 0940  . valproic acid (DEPAKENE) solution 125 mg  125 mg Per Tube QHS Shelda Pal, DO   125 mg at 07/29/18 2238  . vancomycin variable dose per unstable renal function (pharmacist dosing)   Does not apply See admin instructions [place], Valeda Malm, [facility]         Discharge Medications: Please see discharge summary for a list of discharge medications.  Relevant Imaging  Results:  Relevant Lab Results:   Additional Information SSN [id number]  Eileen Stanford, LCSW

## 2018-07-30 NOTE — TOC Initial Note (Signed)
Transition of Care [facility]) - Initial/Assessment Note    Patient Details  Name: Sean Collins MRN: [id number] Date of Birth: 03/02/1951  Transition of Care [facility]) CM/SW Contact:    Eileen Stanford, LCSW Phone Number: 07/30/2018, 1:56 PM  Clinical Narrative:    Pt is only alert to self. CSW spoke with pt's son, Patrick Jupiter. CSW confirmed with pt's son that pt had been at [facility] prior to admission. Per pr's son, pt has been at [facility] for 2 going on 3 years. Pt's son confirmed pt would return there at d/c. CSW to follow up with facility. Pt has a pending COVID-19 test. SNF will require there to be a negative test result prior to return. MD aware.               Expected Discharge Plan: Skilled Nursing Facility Barriers to Discharge: Insurance Authorization, Continued Medical Work up(COVID-19 rule out, awaiting test results)   Patient Goals and CMS Choice Patient states their goals for this hospitalization and ongoing recovery are:: to return to snf CMS Medicare.gov Compare Post Acute Care list provided to:: Patient Represenative (must comment)(brother) Choice offered to / list presented to : Sibling  Expected Discharge Plan and Services Expected Discharge Plan: [place] In-house Referral: Clinical Social Work     Living arrangements for the past 2 months: [place]                          Prior Living Arrangements/Services Living arrangements for the past 2 months: [place] Lives with:: Self Patient language and need for interpreter reviewed:: No Do you feel safe going back to the place where you live?: No      Need for Family Participation in Patient Care: No (Comment) Care giver support system in place?: Yes (comment)(brother)   Criminal Activity/Legal Involvement Pertinent to Current Situation/Hospitalization: No - Comment as needed  Activities of Daily Living Home Assistive Devices/Equipment: None, Blood pressure cuff,  Enteral Feeding Supplies, Feeding equipment, External Monitoring Devices ADL Screening (condition at time of admission) Patient's cognitive ability adequate to safely complete daily activities?: No Is the patient deaf or have difficulty hearing?: No Does the patient have difficulty seeing, even when wearing glasses/contacts?: No Does the patient have difficulty concentrating, remembering, or making decisions?: Yes Patient able to express need for assistance with ADLs?: Yes Does the patient have difficulty dressing or bathing?: Yes Independently performs ADLs?: No Communication: Needs assistance Is this a change from baseline?: Pre-admission baseline Dressing (OT): Dependent Is this a change from baseline?: Pre-admission baseline Grooming: Dependent Is this a change from baseline?: Pre-admission baseline Feeding: Dependent Is this a change from baseline?: Pre-admission baseline Bathing: Dependent Is this a change from baseline?: Pre-admission baseline Toileting: Dependent Is this a change from baseline?: Pre-admission baseline In/Out Bed: Dependent Is this a change from baseline?: Pre-admission baseline Walks in Home: Dependent Is this a change from baseline?: Pre-admission baseline Does the patient have difficulty walking or climbing stairs?: Yes Weakness of Legs: Both Weakness of Arms/Hands: Both  Permission Sought/Granted Permission sought to share information with : Facility Sport and exercise psychologist, Family Supports    Share Information with NAME: Patrick Jupiter  Permission granted to share info w AGENCY: [facility]  Permission granted to share info w Relationship: Brother     Emotional Assessment Appearance:: Appears stated age Attitude/Demeanor/Rapport: Unable to Assess Affect (typically observed): Unable to Assess Orientation: : Oriented to Self Alcohol / Substance Use: Not Applicable Psych Involvement:  No (comment)  Admission diagnosis:  Obstructive uropathy [N13.9] AKI  (acute kidney injury) ([place]) [N17.9] Anemia, unspecified type [D64.9] Sepsis with acute renal failure without septic shock, due to unspecified organism, unspecified acute renal failure type ([place]) [A41.9, R65.20, N17.9] Acute kidney injury ([place]) [N17.9] Patient Active Problem List   Diagnosis Date Noted  . Hyperkalemia 07/27/2018  . Acute kidney injury ([place]) 07/27/2018  . Aphasia   . AKI (acute kidney injury) ([place]) 07/06/2018  . Neoplasm of skin of abdomen 04/23/2018  . Renal insufficiency 09/22/2016  . Type 2 diabetes mellitus with diabetic nephropathy, without long-term current use of insulin ([place]) 09/13/2016  . Depressive psychosis ([place]) 09/13/2016  . Cervical myofascial pain syndrome 08/29/2016  . Cervical facet joint syndrome 08/29/2016  . Recurrent falls 07/11/2016  . HCAP (healthcare-associated pneumonia) 07/05/2016  . Major depressive disorder, recurrent episode ([place]) 04/17/2016  . Shoulder pain, left 02/07/2016  . Essential hypertension 12/08/2015  . Late effects of CVA (cerebrovascular accident) 12/08/2015  . Cerebrovascular accident (CVA) due to thrombosis of right middle cerebral artery ([place])   . Slurred speech   . Malnutrition of moderate degree 11/04/2015  . Left hemiparesis ([place])   . Dysphagia   . Hyperlipidemia 11/16/2011  . Gout 11/16/2011  . History of stroke 11/16/2011  . CAD (coronary artery disease) 11/16/2011   PCP:  [name], MD Pharmacy:  No Pharmacies Listed    Social Determinants of Health (SDOH) Interventions    Readmission Risk Interventions Readmission Risk Prevention Plan 07/30/2018  Transportation Screening Complete  PCP or Specialist Appt within 3-5 Days Complete  HRI or [place] Not Complete  HRI or Home Care Consult comments N/A  Social Work Consult for [place] Planning/Counseling Complete  Palliative Care Screening Not Complete  Palliative Care Screening Not Complete Comments N/A  Medication Review Human resources officer) Complete  Some recent data might be hidden

## 2018-07-30 NOTE — Progress Notes (Signed)
Pharmacy Antibiotic Note  Sean Collins is a 68 y.o. male admitted on 07/27/2018 with fever, cough and SOB.  Renal fx has improved since admit  Plan: Vanc 1250 q24h Cefepime 1g IV q24 Monitor renal fx cx vanc lvls prn  Height: 5\' 9"  (175.3 cm) Weight: 171 lb 14.4 oz (78 kg) IBW/kg (Calculated) : 70.7  Temp (24hrs), Avg:98.1 F (36.7 C), Min:97.8 F (36.6 C), Max:98.7 F (37.1 C)  Recent Labs  Lab 07/27/18 1314 07/27/18 1326  07/28/18 0316 07/28/18 0434 07/28/18 1918 07/29/18 0807 07/29/18 1850 07/30/18 0734  WBC 11.4*  --   --  13.1*  --   --   --   --   --   CREATININE 11.60*  --    < >  --  6.74* 3.53* 2.58* 1.89* 1.55*  LATICACIDVEN  --  1.5  --   --   --   --   --   --   --   VANCORANDOM  --   --   --   --   --   --  <4  --   --    < > = values in this interval not displayed.    Estimated Creatinine Clearance: 46.2 mL/min (A) (by C-G formula based on SCr of 1.55 mg/dL (H)).    No Known Allergies   Vancomycin 1250 mg IV Q 24 hrs. Goal AUC 400-550. Expected AUC: 520 SCr used: 1.55  Sean Collins, PharmD, BCPS, BCCCP Clinical Pharmacist [phone]  Please check AMION for all [place] numbers  07/30/2018 2:18 PM

## 2018-07-30 NOTE — Progress Notes (Signed)
[phone] house phone Arletha Grippe sister POA

## 2018-07-30 NOTE — Clinical Social Work Note (Signed)
Pt only alert to self. High risk screening completed via phone with pt's son. NO addition resources needed at this time. Pt will return to [facility] at d/c--pending COVID-19 results. CSW will assist in d/c when pt medically cleared.   [place], [place]

## 2018-07-31 DIAGNOSIS — E870 Hyperosmolality and hypernatremia: Secondary | ICD-10-CM

## 2018-07-31 LAB — BASIC METABOLIC PANEL
Anion gap: 5 (ref 5–15)
BUN: 50 mg/dL — ABNORMAL HIGH (ref 8–23)
CALCIUM: 8.8 mg/dL — AB (ref 8.9–10.3)
CO2: 23 mmol/L (ref 22–32)
Chloride: 126 mmol/L — ABNORMAL HIGH (ref 98–111)
Creatinine, Ser: 1.36 mg/dL — ABNORMAL HIGH (ref 0.61–1.24)
GFR calc Af Amer: >60 mL/min (ref >60)
GFR calc non Af Amer: 53 mL/min — ABNORMAL LOW (ref >60)
Glucose, Bld: 121 mg/dL — ABNORMAL HIGH (ref 70–99)
Potassium: 4.3 mmol/L (ref 3.5–5.1)
Sodium: 154 mmol/L — ABNORMAL HIGH (ref 135–145)

## 2018-07-31 LAB — CBC
HCT: 37 % — ABNORMAL LOW (ref 39.0–52.0)
Hemoglobin: 11.9 g/dL — ABNORMAL LOW (ref 13.0–17.0)
MCH: 29.2 pg (ref 26.0–34.0)
MCHC: 32.2 g/dL (ref 30.0–36.0)
MCV: 90.9 fL (ref 80.0–100.0)
Platelets: 276 10*3/uL (ref 150–400)
RBC: 4.07 MIL/uL — ABNORMAL LOW (ref 4.22–5.81)
RDW: 16.7 % — ABNORMAL HIGH (ref 11.5–15.5)
WBC: 13.1 10*3/uL — ABNORMAL HIGH (ref 4.0–10.5)
nRBC: 1.4 % — ABNORMAL HIGH (ref 0.0–0.2)

## 2018-07-31 LAB — GLUCOSE, CAPILLARY
GLUCOSE-CAPILLARY: 78 mg/dL (ref 70–99)
Glucose-Capillary: 115 mg/dL — ABNORMAL HIGH (ref 70–99)
Glucose-Capillary: 146 mg/dL — ABNORMAL HIGH (ref 70–99)
Glucose-Capillary: 76 mg/dL (ref 70–99)
Glucose-Capillary: 92 mg/dL (ref 70–99)

## 2018-07-31 MED ORDER — ACETAMINOPHEN 650 MG RE SUPP
650.0000 mg | Freq: Once | RECTAL | Status: AC
  Administered 2018-07-31: 650 mg via RECTAL
  Filled 2018-07-31: qty 1

## 2018-07-31 MED ORDER — ONDANSETRON HCL 4 MG/2ML IJ SOLN
4.0000 mg | Freq: Four times a day (QID) | INTRAMUSCULAR | Status: DC | PRN
  Administered 2018-07-31: 4 mg via INTRAVENOUS
  Filled 2018-07-31: qty 2

## 2018-07-31 MED ORDER — GABAPENTIN 250 MG/5ML PO SOLN
100.0000 mg | Freq: Every day | ORAL | Status: DC
  Filled 2018-07-31: qty 2

## 2018-07-31 MED ORDER — ACETAMINOPHEN 325 MG PO TABS
650.0000 mg | ORAL_TABLET | Freq: Four times a day (QID) | ORAL | Status: DC | PRN
  Administered 2018-07-31: 650 mg
  Filled 2018-07-31: qty 2

## 2018-07-31 MED ORDER — INSULIN ASPART 100 UNIT/ML ~~LOC~~ SOLN: 0.0000 [IU] | Freq: Every day | SUBCUTANEOUS | Status: DC

## 2018-07-31 MED ORDER — INSULIN ASPART 100 UNIT/ML ~~LOC~~ SOLN
0.0000 [IU] | Freq: Three times a day (TID) | SUBCUTANEOUS | Status: DC
  Administered 2018-07-31: 2 [IU] via SUBCUTANEOUS

## 2018-07-31 MED ORDER — VALPROATE SODIUM 500 MG/5ML IV SOLN
125.0000 mg | Freq: Once | INTRAVENOUS | Status: AC
  Administered 2018-07-31: 125 mg via INTRAVENOUS
  Filled 2018-07-31: qty 1.25

## 2018-07-31 MED ORDER — HYDROCODONE-ACETAMINOPHEN 5-325 MG PO TABS: 1.0000 | ORAL_TABLET | Freq: Once | ORAL | Status: DC

## 2018-07-31 MED ORDER — DEXTROSE 5 % IV SOLN
INTRAVENOUS | Status: DC
  Administered 2018-07-31 – 2018-08-01 (×3): via INTRAVENOUS

## 2018-07-31 MED ORDER — MORPHINE SULFATE (PF) 2 MG/ML IV SOLN
2.0000 mg | Freq: Once | INTRAVENOUS | Status: AC
  Administered 2018-07-31: 2 mg via INTRAVENOUS
  Filled 2018-07-31: qty 1

## 2018-07-31 NOTE — Progress Notes (Signed)
ID NOTE  COVID 19 testing is negative Can stop isolation  Sean Collins B. Byron for Infectious Diseases [phone]

## 2018-07-31 NOTE — Progress Notes (Signed)
Pt getting a little agitated, restless. MD paged for prn meds.

## 2018-07-31 NOTE — Progress Notes (Signed)
Pt has been turned every 2 hours.

## 2018-07-31 NOTE — Progress Notes (Signed)
PROGRESS NOTE    Sean Collins  TDH:[id number] DOB: [birth year] DOA: 07/27/2018 PCP: [name], MD      Brief Narrative:  Sean Collins is a 68 y.o. M with hx CVA with left-sided weakness and aphasia, dysphagia with PEG, SNF bound, DM, HTN, chronic respiratory failure on 2L [place] and also on palliative care who presented with fever.  In the ER chest x-ray showed bibasilar atelectasis.  Noted to have a potassium 7.5, creatinine 11.6.  Recent admission for aspiration pneumonia.      Assessment & Plan:  Recurrent aspiration pneumonia suspected Has completed 4 days empiric antibiotics.    Patient now mentating at baseline.  Temp < 100 F, heart rate < 100bpm, RR < 24, SpO2 at baseline.    -Discontinue antibiotics   Coronavirus ruled out SARS-CoV-2 testing negative -Remove isolation precautions  Acute renal failure Hyperkalemia Admitted with creatinine >10 mg/dL, K > 7.5 mmol/L.  Started on Lokelma/Veltassa, IV fluids.  There was some degree of urinary obstruction and so foley placed.    Foley removed 3/24, still making urine.  Creatinine has resolved to his baseline, near 1.3.  K normal now. -Continue Flomax -Check Post void residual  Hypernatremia Had tube feeds at half normal rate, no free water for last 4 days.   Estimated free water deficit ~4.8L, rate 160 ml/hr of free water to correct Na at 0.5 mmol/L/hr.   -D5W at 125 ml/hr -Continue free water 200 q6hrs  History CVA -Continue aspirin and atorvastatin  Diabetes, type II, with neuropathy Glucoses well controlled -Continue SSI  -Continue gabapentin  Other medicaitons -Continue allopurinol -Continue ranitidine -Continue scopolamine  Dementia, vascular -Continue Seroquel, Depakote  Chronic respiratory failure -Continue home oxygen      MDM and disposition: The below labs and imaging reports were reviewed and summarized above.  Medication management as above.  The patient was admitted with acute  renal failure and fever.  Found to have aspiration pneumonia, recurrent as well as acute renal failure.  His renal failure has resolved, his pneumonia therapy is been completed, but he remains hypernatremic, severely, so we will start hypotonic IV fuids and closely monitor electrolytes  If Na normal tomorrow, plan for d/c back to SNF.        DVT prophylaxis: Heparin Code Status: Full code Family Communication: None present    Consultants:   None  Procedures:   None  Antimicrobials:   Vancomycin 3/21 to 3/25  Cefepime 3/21 to 3/25   Subjective: Patient is verbal at baseline.  Per nursing report, he has had no respiratory distress, no further fever, no pain complaints, no agitation above baseline, and no vomiting.     Objective: Vitals:   07/31/18 0500 07/31/18 0757 07/31/18 0800 07/31/18 1145  BP:  (!) 153/95 (!) 153/95 (!) 167/94  Pulse: 73 81 76 71  Resp: 18 (!) 22 (!) 21 16  Temp:  (!) 97.5 F (36.4 C)  98 F (36.7 C)  TempSrc:  Axillary  Axillary  SpO2: 100% 100% 100% 99%  Weight:      Height:        Intake/Output Summary (Last 24 hours) at 07/31/2018 1346 Last data filed at 07/31/2018 0521 Gross per 24 hour  Intake 1418.4 ml  Output 1060 ml  Net 358.4 ml   Filed Weights   07/27/18 2239 07/30/18 0402 07/30/18 2235  Weight: 80.5 kg 78 kg 95.6 kg    Examination: General appearance:  adult male, awake but nonverbal HEENT: Anicteric,  conjunctiva pink, lids and lashes normal. No nasal deformity, discharge, epistaxis.  Lips moist, edentulous, no oral lesions, OP dry.   Skin: Warm and dry.  No suspicious rashes or lesions. Cardiac: RRR, nl S1-S2, no murmurs appreciated.  Capillary refill is brisk.  JVP not visible.  No LE edema.  Radia  pulses 2+ and symmetric. Respiratory: Normal respiratory rate and rhythm.  CTAB without rales or wheezes. Abdomen: Abdomen soft.  no TTP. No ascites, distension, hepatosplenomegaly.   MSK: No deformities or effusions.  Neuro: Awake and makes eye contact, but nonverbal, does not consistently follow commands.  EOMI, left sided hemiparesis.   Psych: Unable to assess    Data Reviewed: I have personally reviewed following labs and imaging studies:  CBC: Recent Labs  Lab 07/27/18 1314 07/28/18 0316 07/31/18 1023  WBC 11.4* 13.1* 13.1*  NEUTROABS 10.1*  --   --   HGB 9.9* 10.7* 11.9*  HCT 32.2* 33.7* 37.0*  MCV 91.2 91.1 90.9  PLT 134* 142* 376   Basic Metabolic Panel: Recent Labs  Lab 07/28/18 1918 07/29/18 0807 07/29/18 1850 07/30/18 0734 07/31/18 0246  NA 147* 151* 152* 154* 154*  K 4.6 4.6 4.0 4.2 4.3  CL 117* 117* 120* 121* 126*  CO2 21* 21* 22 21* 23  GLUCOSE 156* 120* 120* 132* 121*  BUN 86* 78* 67* 55* 50*  CREATININE 3.53* 2.58* 1.89* 1.55* 1.36*  CALCIUM 9.7 9.7 9.6 9.4 8.8*   GFR: Estimated Creatinine Clearance: 61.3 mL/min (A) (by C-G formula based on SCr of 1.36 mg/dL (H)). Liver Function Tests: Recent Labs  Lab 07/27/18 1314  AST 43*  ALT 26  ALKPHOS 78  BILITOT 0.4  PROT 7.0  ALBUMIN 2.0*   No results for input(s): LIPASE, AMYLASE in the last 168 hours. No results for input(s): AMMONIA in the last 168 hours. Coagulation Profile: No results for input(s): INR, PROTIME in the last 168 hours. Cardiac Enzymes: No results for input(s): CKTOTAL, CKMB, CKMBINDEX, TROPONINI in the last 168 hours. BNP (last 3 results) No results for input(s): PROBNP in the last 8760 hours. HbA1C: No results for input(s): HGBA1C in the last 72 hours. CBG: Recent Labs  Lab 07/30/18 1241 07/30/18 1638 07/30/18 2125 07/31/18 0801 07/31/18 1149  GLUCAP 133* 109* 95 115* 92   Lipid Profile: No results for input(s): CHOL, HDL, LDLCALC, TRIG, CHOLHDL, LDLDIRECT in the last 72 hours. Thyroid Function Tests: No results for input(s): TSH, T4TOTAL, FREET4, T3FREE, THYROIDAB in the last 72 hours. Anemia Panel: No results for input(s): VITAMINB12, FOLATE, FERRITIN, TIBC, IRON, RETICCTPCT  in the last 72 hours. Urine analysis:    Component Value Date/Time   COLORURINE YELLOW 07/27/2018 1550   APPEARANCEUR CLEAR 07/27/2018 1550   LABSPEC 1.012 07/27/2018 1550   PHURINE 7.0 07/27/2018 1550   GLUCOSEU NEGATIVE 07/27/2018 1550   HGBUR NEGATIVE 07/27/2018 [place] 07/27/2018 [place] 07/27/2018 1550   PROTEINUR NEGATIVE 07/27/2018 1550   UROBILINOGEN 1.0 09/21/2008 1431   NITRITE NEGATIVE 07/27/2018 1550   LEUKOCYTESUR NEGATIVE 07/27/2018 1550   Sepsis Labs: @LABRCNTIP (procalcitonin:4,lacticacidven:4)  ) Recent Results (from the past 240 hour(s))  Culture, sputum-assessment     Status: None   Collection Time: 07/27/18 12:47 AM  Result Value Ref Range Status   Specimen Description SPUTUM  Final   Special Requests NONE  Final   Sputum evaluation   Final    Sputum specimen not acceptable for testing.  Please recollect.   RESULT CALLED TO,  READ BACK BY AND VERIFIED WITH: GRUBEY AT 0700 ON [id number] BY SJW Performed at [facility], [place] [address] [place], [place] [address]    Report Status 07/28/2018 FINAL  Final  Blood Culture (routine x 2)     Status: None (Preliminary result)   Collection Time: 07/27/18  1:01 PM  Result Value Ref Range Status   Specimen Description BLOOD RIGHT WRIST  Final   Special Requests   Final    BOTTLES DRAWN AEROBIC AND ANAEROBIC Blood Culture adequate volume   Culture   Final    NO GROWTH 4 DAYS Performed at [facility], [place] [address] [place], [place] [address]    Report Status PENDING  Incomplete  Blood Culture (routine x 2)     Status: None (Preliminary result)   Collection Time: 07/27/18  1:42 PM  Result Value Ref Range Status   Specimen Description BLOOD BLOOD RIGHT FOREARM  Final   Special Requests   Final    BOTTLES DRAWN AEROBIC ONLY Blood Culture results may not be optimal due to an inadequate volume of blood received in culture bottles   Culture   Final    NO GROWTH 4 DAYS  Performed at [facility], [place] [address] [place], [place] [address]    Report Status PENDING  Incomplete  Urine culture     Status: None   Collection Time: 07/27/18  2:01 PM  Result Value Ref Range Status   Specimen Description URINE, RANDOM  Final   Special Requests NONE  Final   Culture   Final    NO GROWTH Performed at [facility], [address], [place], [place] [address]    Report Status 07/28/2018 FINAL  Final  Respiratory Panel by PCR     Status: None   Collection Time: 07/27/18  3:31 PM  Result Value Ref Range Status   Adenovirus NOT DETECTED NOT DETECTED Final   Coronavirus 229E NOT DETECTED NOT DETECTED Final    Comment: (NOTE) The Coronavirus on the Respiratory Panel, DOES NOT test for the novel  Coronavirus (2019 nCoV)    Coronavirus HKU1 NOT DETECTED NOT DETECTED Final   Coronavirus NL63 NOT DETECTED NOT DETECTED Final   Coronavirus OC43 NOT DETECTED NOT DETECTED Final   Metapneumovirus NOT DETECTED NOT DETECTED Final   Rhinovirus / Enterovirus NOT DETECTED NOT DETECTED Final   Influenza A NOT DETECTED NOT DETECTED Final   Influenza B NOT DETECTED NOT DETECTED Final   Parainfluenza Virus 1 NOT DETECTED NOT DETECTED Final   Parainfluenza Virus 2 NOT DETECTED NOT DETECTED Final   Parainfluenza Virus 3 NOT DETECTED NOT DETECTED Final   Parainfluenza Virus 4 NOT DETECTED NOT DETECTED Final   Respiratory Syncytial Virus NOT DETECTED NOT DETECTED Final   Bordetella pertussis NOT DETECTED NOT DETECTED Final   Chlamydophila pneumoniae NOT DETECTED NOT DETECTED Final   Mycoplasma pneumoniae NOT DETECTED NOT DETECTED Final    Comment: Performed at [facility], [address], [place], [place] [address]  MRSA PCR Screening     Status: Abnormal   Collection Time: 07/28/18 12:26 AM  Result Value Ref Range Status   MRSA by PCR POSITIVE (A) NEGATIVE Final    Comment:        The GeneXpert MRSA Assay (FDA approved for NASAL specimens only), is one  component of a comprehensive MRSA colonization surveillance program. It is not intended to diagnose MRSA infection nor to guide or monitor treatment  for MRSA infections. RESULT CALLED TO, READ BACK BY AND VERIFIED WITH: JACOBSEN,V RN 1624 07/28/2018 MITCHELL,L Performed at [facility] [address] [place], [place] [address]          Radiology Studies: No results found.      Scheduled Meds: . acetaminophen  650 mg Per Tube BID  . allopurinol  150 mg Per Tube Daily  . aspirin  325 mg Per Tube Daily  . atorvastatin  80 mg Per Tube QHS  . Chlorhexidine Gluconate Cloth  6 each Topical Q0600  . free water  200 mL Per Tube QID  . gabapentin  100 mg Oral QHS  . heparin injection (subcutaneous)  5,000 Units Subcutaneous Q8H  . insulin aspart  0-15 Units Subcutaneous TID WC  . insulin aspart  0-5 Units Subcutaneous QHS  . mupirocin ointment  1 application Nasal BID  . neomycin-bacitracin-polymyxin   Topical Daily  . QUEtiapine  25 mg Per Tube BID  . ranitidine  75 mg Per Tube Q12H  . scopolamine  1 patch Transdermal Q72H  . sodium chloride flush  10-40 mL Intracatheter Q12H  . tamsulosin  0.4 mg Oral Daily  . valproic acid  125 mg Per Tube QHS   Continuous Infusions: . sodium chloride    . dextrose 125 mL/hr at 07/31/18 0938  . feeding supplement (JEVITY 1.2 CAL) 80 mL/hr at 07/31/18 0700     LOS: 4 days    Time spent: 25 minutes    Edwin Dada, MD [facility] 07/31/2018, 1:46 PM     Please page through [place]:  [email or web address] Password [email or web address] If 7PM-7AM, please contact night-coverage

## 2018-08-01 ENCOUNTER — Non-Acute Institutional Stay (SKILLED_NURSING_FACILITY): Payer: Medicare Other | Admitting: Internal Medicine

## 2018-08-01 ENCOUNTER — Encounter: Payer: Self-pay | Admitting: Internal Medicine

## 2018-08-01 ENCOUNTER — Inpatient Hospital Stay (HOSPITAL_COMMUNITY): Payer: Medicare Other

## 2018-08-01 DIAGNOSIS — N179 Acute kidney failure, unspecified: Secondary | ICD-10-CM | POA: Diagnosis not present

## 2018-08-01 DIAGNOSIS — R339 Retention of urine, unspecified: Secondary | ICD-10-CM | POA: Diagnosis not present

## 2018-08-01 DIAGNOSIS — J189 Pneumonia, unspecified organism: Secondary | ICD-10-CM

## 2018-08-01 DIAGNOSIS — I2510 Atherosclerotic heart disease of native coronary artery without angina pectoris: Secondary | ICD-10-CM

## 2018-08-01 LAB — BASIC METABOLIC PANEL
Anion gap: 8 (ref 5–15)
BUN: 32 mg/dL — ABNORMAL HIGH (ref 8–23)
CO2: 22 mmol/L (ref 22–32)
Calcium: 8.6 mg/dL — ABNORMAL LOW (ref 8.9–10.3)
Chloride: 117 mmol/L — ABNORMAL HIGH (ref 98–111)
Creatinine, Ser: 1.18 mg/dL (ref 0.61–1.24)
GFR calc Af Amer: >60 mL/min (ref >60)
GFR calc non Af Amer: >60 mL/min (ref >60)
Glucose, Bld: 98 mg/dL (ref 70–99)
Potassium: 5.6 mmol/L — ABNORMAL HIGH (ref 3.5–5.1)
Sodium: 147 mmol/L — ABNORMAL HIGH (ref 135–145)

## 2018-08-01 LAB — CULTURE, BLOOD (ROUTINE X 2)
Culture: NO GROWTH
Culture: NO GROWTH
Special Requests: ADEQUATE

## 2018-08-01 LAB — GLUCOSE, CAPILLARY
GLUCOSE-CAPILLARY: 67 mg/dL — AB (ref 70–99)
Glucose-Capillary: 75 mg/dL (ref 70–99)
Glucose-Capillary: 94 mg/dL (ref 70–99)
Glucose-Capillary: 74 mg/dL (ref 70–99)

## 2018-08-01 MED ORDER — IOHEXOL 300 MG/ML  SOLN
50.0000 mL | Freq: Once | INTRAMUSCULAR | Status: AC | PRN
  Administered 2018-08-01: 30 mL

## 2018-08-01 MED ORDER — MORPHINE SULFATE (PF) 2 MG/ML IV SOLN
2.0000 mg | Freq: Once | INTRAVENOUS | Status: AC
  Administered 2018-08-01: 2 mg via INTRAVENOUS
  Filled 2018-08-01: qty 1

## 2018-08-01 MED ORDER — TAMSULOSIN HCL 0.4 MG PO CAPS: 0.4000 mg | ORAL_CAPSULE | Freq: Every day | ORAL | 0 refills | Status: DC

## 2018-08-01 MED ORDER — FREE WATER: 300.0000 mL | Freq: Four times a day (QID) | 0 refills | Status: DC

## 2018-08-01 MED ORDER — PATIROMER SORBITEX CALCIUM 8.4 G PO PACK
8.4000 g | PACK | Freq: Every day | ORAL | Status: DC
  Filled 2018-08-01: qty 1

## 2018-08-01 MED ORDER — JEVITY 1.2 CAL PO LIQD: 1000.0000 mL | ORAL | 0 refills | Status: DC

## 2018-08-01 MED ORDER — IPRATROPIUM-ALBUTEROL 0.5-2.5 (3) MG/3ML IN SOLN: 3.0000 mL | Freq: Four times a day (QID) | RESPIRATORY_TRACT | Status: DC | PRN

## 2018-08-01 NOTE — Progress Notes (Signed)
Spoke with medical regarding results in for pt DG-peg tube-placement noted as good. Pt blood sugar. Medical-Dr. Loleta Books arrived to unit and assessed pt and placements of peg tube. Ok to utilize. Pt prepping for d/c per order of Dr. Loleta Books. Midline to be removed by IV team per Dr. Kayleen Memos.

## 2018-08-01 NOTE — Progress Notes (Signed)
   NURSING HOME LOCATION:  [facility] ROOM NUMBER:  [id number]  CODE STATUS:  Full Code  PCP:  Whitney Poole,[facility] NP/ Dayna Ramus MD  This is a nursing facility follow up for Sean Collins readmission within 30 days  Interim medical record and care since last [place] visit was updated with review of diagnostic studies and change in clinical status since last visit were documented.  HPI: The patient had been hospitalized 3/21-3/26/2020, admitted from the SNF with acute, severe AKI with a creatinine of 11.7 and potassium of 7.5.  Chest x-ray in the ED revealed bibasilar atelectasis; broad-spectrum antibiotics were initiated for HCAP, most likely recurrent aspiration pneumonia. Subsequently COVID-19 testing was negative as was the extensive respiratory panel. He received vancomycin and cefepime x4 days until he was clinically stable.  Lokelma/Veltassa and IV fluids were initiated for the AKI and hyperkalemia.  Urinary obstruction necessitating Foley placement.  Creatinine and potassium normalized and Foley was removed 3/24; but post residual of over 500 cc and rising potassium necessitated reinsertion of the Foley again. Lokelma was given and free water increased for 4 days.  Because of bladder obstruction Scopolamine was discontinued & Flomax was initiated.  Urologic follow-up was recommended if a voiding trial in 3-4 days post discharge was failed. ( Note as this would fall on a weekend; Foley removal will be deferred until at least 3/30). During hospitalization tube feeding rate was reduced and free water was held; but sodium increased.  It was recommended that free water 300 cc every 6 hours be continued for 4 days with repeating the BMP.  Were sodium to be less than 145; the free water could be decreased to 200 cc every 6 hours indefinitely.  Jevity or other tube feeding available in the SNF was to be continued at 80 cc/h continuously.  PMH includes stroke, prior renal  insufficiency, essential hypertension, dyslipidemia, and diabetes with peripheral neuropathy.  Stroke is complicated by dysphagia ,dysarthria and hemiplegia.  Review of systems: Nonverbal state precludes completion of review of systems.  Physical exam:  Pertinent or positive findings: As noted he is nonverbal.  He stares blankly at the examiner.  He keeps his eyes deviated to the left but can look to the right laterally when instructed.  Teeth are severely coated.  There is exfoliation of the lips.  He is Geneticist, molecular.  He has pattern alopecia.  Heart sounds are totally obscured by bilateral rhonchi which are essentially symmetric.  He also has upper airway gurgling sounds. PEG present. Pedal pulses are decreased.  Hemaparesis is present on the left.  General appearance: Adequately nourished; no acute distress, increased work of breathing is present.   Lymphatic: No lymphadenopathy about the head, neck, axilla. Eyes: No conjunctival inflammation or lid edema is present. There is no scleral icterus. Ears:  External ear exam shows no significant lesions or deformities.   Nose:  External nasal examination shows no deformity or inflammation. Nasal mucosa are pink and moist without lesions, exudates Neck:  No thyromegaly, masses, tenderness noted.    Abdomen: Bowel sounds are normal. Abdomen is soft and nontender with no organomegaly, hernias, masses. GU: Deferred  Extremities:  No cyanosis, clubbing, edema  Neurologic exam : Balance, [place], finger to nose testing could not be completed due to clinical state Skin: Warm & dry w/o tenting. No significant lesions or rash.  See summary under each active problem in the Problem List with associated updated therapeutic plan

## 2018-08-01 NOTE — Progress Notes (Signed)
Spoke with RN Josh, and asked if the unit could remove the midline, due to the midline being equivalent to a peripheral IV, he stated yes. So midline will be removed by the unit.

## 2018-08-01 NOTE — Assessment & Plan Note (Signed)
3/26-3/30 free water 300 cc every 6 hours via PEG Repeat BMP 3/30: If sodium less than 145 decrease free water to 200 cc every 6 hours indefinitely

## 2018-08-01 NOTE — Assessment & Plan Note (Addendum)
Speech Therapy to consult and monitor at SNF [facility]ction prn

## 2018-08-01 NOTE — Progress Notes (Signed)
Report called to Barrister's clerk at [facility].

## 2018-08-01 NOTE — Care Management Important Message (Signed)
Important Message  Patient Details  Name: Sean Collins MRN: [id number] Date of Birth: [birth year]   Medicare Important Message Given:  Yes    Orbie Pyo 08/01/2018, 2:34 PM

## 2018-08-01 NOTE — TOC Transition Note (Addendum)
Transition of Care [facility]) - CM/SW Discharge Note   Patient Details  Name: Sean Collins MRN: [id number] Date of Birth: 1951/01/07  [facility]) CM/SW Contact:  Eileen Stanford, LCSW Phone Number: 08/01/2018, 11:56 AM   Clinical Narrative:    Clinical Social Worker facilitated patient discharge including contacting patient family and facility to confirm patient discharge plans.  Clinical information faxed to facility and family agreeable with plan.  CSW arranged ambulance transport via [facility] to [facility] (room 302A).  RN to call [phone] for report prior to discharge.  Clinical Social Worker will sign off for now as social work intervention is no longer needed. Please consult [place] again if new need arises.     Final next level of care: Skilled Nursing Facility Barriers to Discharge: Ship broker, Continued Medical Work up(COVID-19 rule out, awaiting test results)   Patient Goals and CMS Choice Patient states their goals for this hospitalization and ongoing recovery are:: to return to snf CMS Medicare.gov Compare Post Acute Care list provided to:: Patient Represenative (must comment)(brother) Choice offered to / list presented to : Sibling  Discharge Placement              Patient chooses bed at: [facility] Patient to be transferred to facility by: [place] Name of family member notified: Patrick Jupiter, brother Patient and family notified of of transfer: 08/01/18  Discharge Plan and Services In-house Referral: Clinical Social Work                        Social Determinants of Health (SDOH) Interventions     Readmission Risk Interventions Readmission Risk Prevention Plan 07/30/2018  Transportation Screening Complete  PCP or Specialist Appt within 3-5 Days Complete  HRI or [place] Not Complete  HRI or Home Care Consult comments N/A  Social Work Consult for [place] Planning/Counseling Complete  Palliative Care  Screening Not Complete  Palliative Care Screening Not Complete Comments N/A  Medication Review Press photographer) Complete  Some recent data might be hidden

## 2018-08-01 NOTE — Patient Instructions (Signed)
See assessment and plan under each diagnosis in the problem list and acutely for this visit 

## 2018-08-01 NOTE — Assessment & Plan Note (Addendum)
[facility] referral if patient fails Foley removal 3/30

## 2018-08-01 NOTE — Discharge Summary (Signed)
Physician Discharge Summary  Sean Collins GBT:[id number] DOB: 08-Apr-1951 DOA: 07/27/2018  PCP: [name], MD  Admit date: 07/27/2018 Discharge date: 08/01/2018  [facility]  Disposition:  [facility]   Recommendations for Outpatient Follow-up:  1. Follow up with PCP in 1-2 weeks 2. Please obtain BMP in 5 days 3. For next 4 days: give 300 cc free water q6hrs; thereafter, return to maintenance rate 200 cc q6hrs 4. Maintain foley catheter and schedule Urology follow up; may remove foley in 3-4 days and attempt voiding trial; if fails, replace foley catheter    Home Health: N/A  Equipment/Devices: TBD at SNF  Discharge Condition: Fair, continued high risk of aspiration  CODE STATUS: FULL Diet recommendation: Per tube: Jevity 1.2 at 80 cc/hr, plus ultimately 200 cc q6h of free water     Brief/Interim Summary: Sean Collins is a 68 y.o. M with hx CVA with left-sided weakness and aphasia, now non-verbal at baseline, dysphagia with PEG, SNF bound, DM, HTN, chronic respiratory failure on 2L [place] at night and also on palliative care who presented with fever.    In the ER chest x-ray showed bibasilar atelectasis.  Noted to have a potassium 7.5, creatinine 11.6.  Started on broad spectrum antibiotics for CAP with MDR risk factors.  SAR-CoV-2 testing sent.       PRINCIPAL HOSPITAL DIAGNOSIS: Acute renal failure and sepsis from pneumonia    Discharge Diagnoses:   Recurrent likely aspiration pneumonia The patient has poor ability to handle secretions and is known high risk for aspiration pneumonia.    Here, his CXR showed atelectasis vs pneumonia, he was treated with 4 days empiric antibiotics broad spectrum antibiotics (Vancomycin and cefepime).  Until all relevant parameters normalized (mentation returned to baseline, temp < 100 F, heart rate < 100bpm, RR < 24, SpO2 at baseline).     Coronavirus ruled out SARS-CoV-2 testing NEGATIVE   Acute renal  failure Hyperkalemia Admitted with creatinine >10 mg/dL, K > 7.5 mmol/L.  Started on Lokelma/Veltassa, IV fluids.  There was some degree of urinary obstruction and so foley placed.   IV fluids continued and creatinine and potassium normalized.  Foley removed 3/24, but patient noted subsequently to have >500 cc PVR as well as rising K.  Foley replaced, Lokelma given.  Free water increased for 4 days.  Repeat BMP in 5 days.  Bladder outlet obstruction Flomax started.  Scopolamine stopped.  Avoid antimuscarinics. -Schedule Urology follow up -Maintain foley for now and attempt voiding trial again in 3-4 days  Hypernatremia Early in hospitalization, tube feed rate reduced and free water held.  Sodium increased.  Overnight, free water administered IV and free water flushes increased.   -Continue free water 300 q6hrs for next 4 days then repeat BMP and if Na <145, decrease free water to 200 ml q6hrs indefinitely  History CVA  Diabetes, type II, with neuropathy  Dementia, vascular -Continue Seroquel, Depakote  Chronic respiratory failure Continue home oxygen at night          Discharge Instructions  Discharge Instructions    Diet Carb Modified   Complete by:  As directed    Discharge instructions   Complete by:  As directed    You were admitted for fever, cough and kidney failure.  The fever was possibly from a pneumonia.  You were treated with IV antibiotics and your symptoms resolved.  Your kidney failure was from dehydration and bladder obstruction.  For the bladder obstruction: STOP scopolamine STart  Flomax Schedule the patient for an outpatient follow up with [facility] Attempt to remove the foley and do a voiding trial in 3-4 days If this voiding trial fails, replace foley and call [facility] for a sooner appointment   For the dehydration: Our dietitian reviewed the patient's daily calorie and fluid requirements and recommended: Increase  Jevity rate to 80 ml/hr continuous  For the next 4 days: Give 300 ml of free water per PEG tube every 6 hours  After that, decrease to 200 ml of free water per PEG tube every 6 hours (this is the maintenance rate, to accompany 80 cc/hr of Jevity 1.2cal)   For the High potassium level: Repeat a potassium level and sodium level in 5 days   Increase activity slowly   Complete by:  As directed      Allergies as of 08/01/2018   No Known Allergies     Medication List    STOP taking these medications   CEFTAZIDIME IV   ROCEPHIN IJ   scopolamine 1 MG/3DAYS Commonly known as:  TRANSDERM-SCOP   SOLU-Medrol 125 mg/2 mL injection Generic drug:  methylPREDNISolone sodium succinate   STERILE WATER PO     TAKE these medications   acetaminophen 325 MG tablet Commonly known as:  TYLENOL Place 650 mg into feeding tube 2 (two) times daily.   allopurinol 150 mg Tabs tablet Commonly known as:  ZYLOPRIM Place 150 mg into feeding tube daily.   aspirin 325 MG tablet Place 325 mg into feeding tube daily.   atenolol 50 MG tablet Commonly known as:  TENORMIN Place 50 mg into feeding tube See admin instructions. 50 mg per tube every 12 hours and hold if Systolic reading is <130   atorvastatin 80 MG tablet Commonly known as:  LIPITOR Place 80 mg into feeding tube at bedtime.   Biofreeze 4 % Gel Generic drug:  Menthol (Topical Analgesic) Apply 1 application topically See admin instructions. Apply to left hand 2 times a day FOR PAIN   bisacodyl 10 MG suppository Commonly known as:  DULCOLAX Place 10 mg rectally daily as needed (for constipation not relieved by Milk of Mag).   divalproex 125 MG DR tablet Commonly known as:  DEPAKOTE 125 mg See admin instructions. Give 125 mg per tube at bedtime for mood   feeding supplement (JEVITY 1.2 CAL) Liqd Place 1,000 mLs into feeding tube continuous. What changed:    how much to take  how to take this  additional instructions   folic  acid 1 MG tablet Commonly known as:  FOLVITE Place 1 mg into feeding tube daily.   free water Soln Place 300 mLs into feeding tube 4 (four) times daily.   gabapentin 100 MG capsule Commonly known as:  NEURONTIN 100 mg See admin instructions. Give 100 mg per Peg Tube at bedtime   ipratropium-albuterol 0.5-2.5 (3) MG/3ML Soln Commonly known as:  DUONEB Take 3 mLs by nebulization every 6 (six) hours as needed for up to 2 days (for wheezing, congestion, or shortness of breath).   loperamide 2 MG tablet Commonly known as:  IMODIUM A-D Place 2-4 mg into feeding tube See admin instructions. Give 4 mg per tube with initial loose stool, then 2 mg after each subsequent loose stool with a max dose of 8 mg/24 hrs   magnesium hydroxide 400 MG/5ML suspension Commonly known as:  MILK OF MAGNESIA Place 30 mLs into feeding tube daily as needed for mild constipation.   OXYGEN Inhale 2  L into the lungs See admin instructions. 2 liters of oxygen as needed for hypoxia and KEEP SAT AT >90%   RA Saline Enema 19-7 GM/118ML Enem Place 1 enema rectally daily as needed (for constipation not relieved by Dulcolax suppository and call MD if not relieved by enema).   ranitidine 15 MG/ML syrup Commonly known as:  ZANTAC Place 5 mg into feeding tube every 12 (twelve) hours.   Refresh Liquigel 1 % Gel Generic drug:  Carboxymethylcellulose Sodium Place 1 drop into both eyes 3 (three) times daily.   SEROquel 25 MG tablet Generic drug:  QUEtiapine Place 25 mg into feeding tube 2 (two) times daily.   tamsulosin 0.4 MG Caps capsule Commonly known as:  FLOMAX Take 1 capsule (0.4 mg total) by mouth daily. Start taking on:  August 02, 2018   Tussin 100 MG/5ML syrup Generic drug:  guaifenesin 200 mg See admin instructions. Give 200 mg per tube three times a day   Vitamin D-3 25 MCG (1000 UT) Caps Place 1,000 Units into feeding tube daily.       Contact information for follow-up providers    [name], MD Follow up.   Specialty:  Internal Medicine Why:  please schedule hospital follow up Contact information: [place] [address] [phone]            Contact information for after-discharge care    Destination    [facility] .   Service:  Skilled Nursing Contact information: [address] [place] [phone]                 No Known Allergies  Consultations:  None   Procedures/Studies: Dg Chest 2 View  Result Date: 07/07/2018 CLINICAL DATA:  Cough EXAM: CHEST - 2 VIEW COMPARISON:  07/06/2018 FINDINGS: Cardiac shadow is stable. Aortic calcifications are again seen. Increasing bibasilar atelectasis is noted. The overall inspiratory effort is poor. No bony abnormality is seen. IMPRESSION: Bibasilar atelectasis increased from the prior exam. Electronically Signed   By: Inez Catalina M.D.   On: 07/07/2018 09:43   Dg Abdomen Peg Tube Location  Result Date: 08/01/2018 CLINICAL DATA:  Check location of PEG tube. EXAM: ABDOMEN - 1 VIEW COMPARISON:  03/29/2018. FINDINGS: 30 mL of Omnipaque 300 was injected via PEG tube to check location. There is no extraluminal spread of contrast. There is good opacification of the stomach and proximal small bowel. Incidental inferior vena cava filter. IMPRESSION: Satisfactory PEG tube placement. Electronically Signed   By: Staci Righter M.D.   On: 08/01/2018 10:43   Dg Chest Port 1 View  Result Date: 07/27/2018 CLINICAL DATA:  Onset fever, cough and shortness of breath this morning. EXAM: PORTABLE CHEST 1 VIEW COMPARISON:  PA and lateral chest 07/07/2018 12/26/2015. FINDINGS: Lung volumes are low. Mild subsegmental atelectasis is seen in the bases. Heart size is enlarged. No pneumothorax or pleural effusion. IMPRESSION: Bibasilar subsegmental atelectasis. Cardiomegaly. Electronically Signed   By: Inge Rise M.D.   On: 07/27/2018 14:23   Dg Chest Port 1  View  Result Date: 07/06/2018 CLINICAL DATA:  Initial evaluation for acute cough, shortness of breath. EXAM: PORTABLE CHEST 1 VIEW COMPARISON:  Prior radiograph from 10/27/2015. FINDINGS: Cardiac and mediastinal silhouettes are stable in size and contour, and remain within normal limits. Lungs are hypoinflated. Diffuse pulmonary interstitial congestion, which could reflect pulmonary interstitial edema and/or atypical infection/bronchiolitis. Superimposed mild bibasilar atelectasis. No consolidative opacity. No pleural  effusion. No pneumothorax. No acute osseous finding. IMPRESSION: 1. Diffuse pulmonary interstitial congestion, which could reflect mild diffuse pulmonary interstitial edema or possibly atypical infection/bronchiolitis. No consolidative opacity to suggest pneumonia. 2. Low lung volumes with superimposed mild bibasilar atelectasis. Electronically Signed   By: Jeannine Boga M.D.   On: 07/06/2018 02:19       Subjective: Nonverbal.  No new fever, respiratory distress, agitation.  He did pull on PEG tube overnight, but was redirectable.  This morning, the tube was imaged with radiographic contrast and was noted to be functioning properly.  Discussed with Dr. Pascal Lux, IR, who placed the tube, who was comfortable with current use.  Discharge Exam: Vitals:   08/01/18 0401 08/01/18 0802  BP: (!) 156/72 (!) 151/91  Pulse: 94 70  Resp: (!) 24 16  Temp: 99.2 F (37.3 C) 98.9 F (37.2 C)  SpO2: 97% 99%   Vitals:   07/31/18 1937 07/31/18 2333 08/01/18 0401 08/01/18 0802  BP: (!) 158/97 (!) 149/80 (!) 156/72 (!) 151/91  Pulse: 80 84 94 70  Resp: (!) 21 18 (!) 24 16  Temp:  98.5 F (36.9 C) 99.2 F (37.3 C) 98.9 F (37.2 C)  TempSrc:  Axillary Axillary Axillary  SpO2:  99% 97% 99%  Weight:   80.7 kg   Height:        General: Pt is awake, makes eye contact, lying in bed Cardiovascular: RRR, nl S1-S2, no murmurs appreciated.   No LE edema.   Respiratory: Normal respiratory rate  and rhythm.  CTAB without rales or wheezes. Abdominal: Abdomen soft and non-tender.  No distension or HSM.   Neuro/Psych: Left sided hemiparesis and contractures.  Non-verbal.  EOMI.  PERRL.         The results of significant diagnostics from this hospitalization (including imaging, microbiology, ancillary and laboratory) are listed below for reference.     Microbiology: Recent Results (from the past 240 hour(s))  Culture, sputum-assessment     Status: None   Collection Time: 07/27/18 12:47 AM  Result Value Ref Range Status   Specimen Description SPUTUM  Final   Special Requests NONE  Final   Sputum evaluation   Final    Sputum specimen not acceptable for testing.  Please recollect.   RESULT CALLED TO, READ BACK BY AND VERIFIED WITH: GRUBEY AT 0700 ON [id number] BY SJW Performed at [facility], [place] [address], [place], [place] [address]    Report Status 07/28/2018 FINAL  Final  Blood Culture (routine x 2)     Status: None   Collection Time: 07/27/18  1:01 PM  Result Value Ref Range Status   Specimen Description BLOOD RIGHT WRIST  Final   Special Requests   Final    BOTTLES DRAWN AEROBIC AND ANAEROBIC Blood Culture adequate volume   Culture   Final    NO GROWTH 5 DAYS Performed at [facility], [place] [address] [place], [place] [address]    Report Status 08/01/2018 FINAL  Final  Blood Culture (routine x 2)     Status: None   Collection Time: 07/27/18  1:42 PM  Result Value Ref Range Status   Specimen Description BLOOD BLOOD RIGHT FOREARM  Final   Special Requests   Final    BOTTLES DRAWN AEROBIC ONLY Blood Culture results may not be optimal due to an inadequate volume of blood received in culture bottles   Culture   Final    NO GROWTH 5 DAYS Performed at [facility], [place] [address], [place], [place] [address]    Report Status 08/01/2018 FINAL  Final  Urine culture     Status: None   Collection Time: 07/27/18  2:01 PM  Result Value Ref Range Status    Specimen Description URINE, RANDOM  Final   Special Requests NONE  Final   Culture   Final    NO GROWTH Performed at [facility], [place] [address] [place], [place] [address]    Report Status 07/28/2018 FINAL  Final  Respiratory Panel by PCR     Status: None   Collection Time: 07/27/18  3:31 PM  Result Value Ref Range Status   Adenovirus NOT DETECTED NOT DETECTED Final   Coronavirus 229E NOT DETECTED NOT DETECTED Final    Comment: (NOTE) The Coronavirus on the Respiratory Panel, DOES NOT test for the novel  Coronavirus (2019 nCoV)    Coronavirus HKU1 NOT DETECTED NOT DETECTED Final   Coronavirus NL63 NOT DETECTED NOT DETECTED Final   Coronavirus OC43 NOT DETECTED NOT DETECTED Final   Metapneumovirus NOT DETECTED NOT DETECTED Final   Rhinovirus / Enterovirus NOT DETECTED NOT DETECTED Final   Influenza A NOT DETECTED NOT DETECTED Final   Influenza B NOT DETECTED NOT DETECTED Final   Parainfluenza Virus 1 NOT DETECTED NOT DETECTED Final   Parainfluenza Virus 2 NOT DETECTED NOT DETECTED Final   Parainfluenza Virus 3 NOT DETECTED NOT DETECTED Final   Parainfluenza Virus 4 NOT DETECTED NOT DETECTED Final   Respiratory Syncytial Virus NOT DETECTED NOT DETECTED Final   Bordetella pertussis NOT DETECTED NOT DETECTED Final   Chlamydophila pneumoniae NOT DETECTED NOT DETECTED Final   Mycoplasma pneumoniae NOT DETECTED NOT DETECTED Final    Comment: Performed at [facility], [address] [place], [place] [address]  MRSA PCR Screening     Status: Abnormal   Collection Time: 07/28/18 12:26 AM  Result Value Ref Range Status   MRSA by PCR POSITIVE (A) NEGATIVE Final    Comment:        The GeneXpert MRSA Assay (FDA approved for NASAL specimens only), is one component of a comprehensive MRSA colonization surveillance program. It is not intended to diagnose MRSA infection nor to guide or monitor treatment for MRSA infections. RESULT CALLED TO, READ BACK BY AND VERIFIED  WITH: JACOBSEN,V RN [id number] 07/28/2018 MITCHELL,L Performed at [facility] [address], [place], [place] [address]      Labs: BNP (last 3 results) Recent Labs    07/06/18 0424  BNP 350.0*   Basic Metabolic Panel: Recent Labs  Lab 07/29/18 0807 07/29/18 1850 07/30/18 0734 07/31/18 0246 08/01/18 0612  NA 151* 152* 154* 154* 147*  K 4.6 4.0 4.2 4.3 5.6*  CL 117* 120* 121* 126* 117*  CO2 21* 22 21* 23 22  GLUCOSE 120* 120* 132* 121* 98  BUN 78* 67* 55* 50* 32*  CREATININE 2.58* 1.89* 1.55* 1.36* 1.18  CALCIUM 9.7 9.6 9.4 8.8* 8.6*   Liver Function Tests: Recent Labs  Lab 07/27/18 1314  AST 43*  ALT 26  ALKPHOS 78  BILITOT 0.4  PROT 7.0  ALBUMIN 2.0*   No results for input(s): LIPASE, AMYLASE in the last 168 hours. No results for input(s): AMMONIA in the last 168 hours. CBC: Recent Labs  Lab 07/27/18 1314 07/28/18 0316 07/31/18 1023  WBC 11.4* 13.1* 13.1*  NEUTROABS 10.1*  --   --   HGB 9.9* 10.7* 11.9*  HCT 32.2*  33.7* 37.0*  MCV 91.2 91.1 90.9  PLT 134* 142* 276   Cardiac Enzymes: No results for input(s): CKTOTAL, CKMB, CKMBINDEX, TROPONINI in the last 168 hours. BNP: Invalid input(s): POCBNP CBG: Recent Labs  Lab 07/31/18 1932 07/31/18 2332 08/01/18 0357 08/01/18 0749 08/01/18 1131  GLUCAP 78 76 75 94 67*   D-Dimer No results for input(s): DDIMER in the last 72 hours. Hgb A1c No results for input(s): HGBA1C in the last 72 hours. Lipid Profile No results for input(s): CHOL, HDL, LDLCALC, TRIG, CHOLHDL, LDLDIRECT in the last 72 hours. Thyroid function studies No results for input(s): TSH, T4TOTAL, T3FREE, THYROIDAB in the last 72 hours.  Invalid input(s): FREET3 Anemia work up No results for input(s): VITAMINB12, FOLATE, FERRITIN, TIBC, IRON, RETICCTPCT in the last 72 hours. Urinalysis    Component Value Date/Time   COLORURINE YELLOW 07/27/2018 1550   APPEARANCEUR CLEAR 07/27/2018 1550   LABSPEC 1.012 07/27/2018 1550   PHURINE  7.0 07/27/2018 [place] 07/27/2018 1550   HGBUR NEGATIVE 07/27/2018 1550   BILIRUBINUR NEGATIVE 07/27/2018 1550   KETONESUR NEGATIVE 07/27/2018 1550   PROTEINUR NEGATIVE 07/27/2018 1550   UROBILINOGEN 1.0 09/21/2008 1431   NITRITE NEGATIVE 07/27/2018 1550   LEUKOCYTESUR NEGATIVE 07/27/2018 1550   Sepsis Labs Invalid input(s): PROCALCITONIN,  WBC,  LACTICIDVEN Microbiology Recent Results (from the past 240 hour(s))  Culture, sputum-assessment     Status: None   Collection Time: 07/27/18 12:47 AM  Result Value Ref Range Status   Specimen Description SPUTUM  Final   Special Requests NONE  Final   Sputum evaluation   Final    Sputum specimen not acceptable for testing.  Please recollect.   RESULT CALLED TO, READ BACK BY AND VERIFIED WITH: GRUBEY AT 0700 ON [id number] BY SJW Performed at [facility], [place] [address], [place], [place] [address]    Report Status 07/28/2018 FINAL  Final  Blood Culture (routine x 2)     Status: None   Collection Time: 07/27/18  1:01 PM  Result Value Ref Range Status   Specimen Description BLOOD RIGHT WRIST  Final   Special Requests   Final    BOTTLES DRAWN AEROBIC AND ANAEROBIC Blood Culture adequate volume   Culture   Final    NO GROWTH 5 DAYS Performed at [facility], [place] [address], [place], [place] [address]    Report Status 08/01/2018 FINAL  Final  Blood Culture (routine x 2)     Status: None   Collection Time: 07/27/18  1:42 PM  Result Value Ref Range Status   Specimen Description BLOOD BLOOD RIGHT FOREARM  Final   Special Requests   Final    BOTTLES DRAWN AEROBIC ONLY Blood Culture results may not be optimal due to an inadequate volume of blood received in culture bottles   Culture   Final    NO GROWTH 5 DAYS Performed at [facility], [place] [address], [place], [place] [address]    Report Status 08/01/2018 FINAL  Final  Urine culture     Status: None   Collection Time: 07/27/18  2:01 PM  Result Value Ref  Range Status   Specimen Description URINE, RANDOM  Final   Special Requests NONE  Final   Culture   Final    NO GROWTH Performed at [facility], [place] [address], [place], [place] [address]    Report Status 07/28/2018 FINAL  Final  Respiratory Panel by PCR  Status: None   Collection Time: 07/27/18  3:31 PM  Result Value Ref Range Status   Adenovirus NOT DETECTED NOT DETECTED Final   Coronavirus 229E NOT DETECTED NOT DETECTED Final    Comment: (NOTE) The Coronavirus on the Respiratory Panel, DOES NOT test for the novel  Coronavirus (2019 nCoV)    Coronavirus HKU1 NOT DETECTED NOT DETECTED Final   Coronavirus NL63 NOT DETECTED NOT DETECTED Final   Coronavirus OC43 NOT DETECTED NOT DETECTED Final   Metapneumovirus NOT DETECTED NOT DETECTED Final   Rhinovirus / Enterovirus NOT DETECTED NOT DETECTED Final   Influenza A NOT DETECTED NOT DETECTED Final   Influenza B NOT DETECTED NOT DETECTED Final   Parainfluenza Virus 1 NOT DETECTED NOT DETECTED Final   Parainfluenza Virus 2 NOT DETECTED NOT DETECTED Final   Parainfluenza Virus 3 NOT DETECTED NOT DETECTED Final   Parainfluenza Virus 4 NOT DETECTED NOT DETECTED Final   Respiratory Syncytial Virus NOT DETECTED NOT DETECTED Final   Bordetella pertussis NOT DETECTED NOT DETECTED Final   Chlamydophila pneumoniae NOT DETECTED NOT DETECTED Final   Mycoplasma pneumoniae NOT DETECTED NOT DETECTED Final    Comment: Performed at [facility], [place] [address] [place], [place] [address]  MRSA PCR Screening     Status: Abnormal   Collection Time: 07/28/18 12:26 AM  Result Value Ref Range Status   MRSA by PCR POSITIVE (A) NEGATIVE Final    Comment:        The GeneXpert MRSA Assay (FDA approved for NASAL specimens only), is one component of a comprehensive MRSA colonization surveillance program. It is not intended to diagnose MRSA infection nor to guide or monitor treatment for MRSA infections. RESULT CALLED TO, READ BACK BY  AND VERIFIED WITH: JACOBSEN,V RN [id number] 07/28/2018 MITCHELL,L Performed at [facility] [address], [place], [place] [address]      Time coordinating discharge: 40 minutes       SIGNED:   Edwin Dada, MD  [facility] 08/01/2018, 12:02 PM

## 2018-08-02 ENCOUNTER — Other Ambulatory Visit: Payer: Self-pay

## 2018-08-02 ENCOUNTER — Non-Acute Institutional Stay: Payer: Medicare Other | Admitting: Adult Health Nurse Practitioner

## 2018-08-02 DIAGNOSIS — Z515 Encounter for palliative care: Secondary | ICD-10-CM

## 2018-08-02 NOTE — Progress Notes (Signed)
[facility] Consult Note Telephone: [phone]  Fax: [phone]  PATIENT NAME: Sean Collins DOB: August 24, 1950 MRN: [id number]  PRIMARY CARE PROVIDER:   [name], MD  REFERRING PROVIDER:  Lorette Ang NP  RESPONSIBLE PARTY:   [facility] brother, [phone]     RECOMMENDATIONS and PLAN:  1.  Aspiration pneumonia.  Patient just returned from hospital for dehydration and aspiration pneumonia.  Patient is high risk for aspiration.  He is NPO and gets meds and continuous feedings through PEG tube.  Patient has been tried on scopolamine patch for secretions and this caused urinary retention.  Patient has been started on guaifenesin syrup, duonebs.  Has also been started on claritin to see if this helps with secretions. May need to increase water flushes to prevent dehydration, which have been increased to 376ml Q 6 hrs til Monday.  This is to be adjusted after results of BMP to be drawn on Monday.  2.  Goals of care.  Patient is a full code.  Family states that want "life before death."  This may be associated with their faith as Jehovah's witnesses.  Patient has indicated to facility NP and this provider that he is tired and would prefer to be treated at facility instead of going back and forth to the hospital.  Spoke with brother, Sean Collins, about patient's wishes to be treated at facility and he has concerns about him getting the care he needs. Have made brother aware that his risk of contracting the coronavirus increases each time he goes to hospital and that he more than likely would not survive if he was infected with it.  Discussed with brother that patient could be hospice appropriate with his low albumin level and recurrent aspiration pneumonia and difficulty controlling his secretions.  Brother has agreed to a hospice consult and order was faxed by Lorette Ang NP.    I spent 50 minutes providing this consultation,  from  12:00 to 12:50. More than 50% of the time in this consultation was spent coordinating communication.   HISTORY OF PRESENT ILLNESS:  Sean Collins is a 68 y.o. year old male with multiple medical problems including aphasia and right sided hemiparesis post CVA, HTN, HLD, DMT2, renal insufficiency. Palliative Care was asked to help address goals of care. Patient is nonverbal and is NPO due to aphasia post CVA. Patient has PEG tube in which he gets continuous feeding and his meds are given through the PEG tube.  CODE STATUS: Full Code  PPS: 20% HOSPICE ELIGIBILITY/DIAGNOSIS:  Yes/aphasia due to CVA, recurrent hospitalizations for aspiration pneumonia with lack of control over secretions  PHYSICAL EXAM:   General:patient is in bed, frail appearing,  Cardiovascular: regular rate and rhythm Pulmonary:lung sounds clear, somewhat labored breathing Abdomen: soft, nontender, + bowel sounds; PEG tube intact GU: foley catheter in place with dark yellow urine noted in bag Extremities: no edema, contractures of left hand Skin: no rashes; no wounds per staff report Neurological: weakness. left sided hemiparesis, aphasia, nonverbal post CVA.  Patient is alert and is able to nonverbally make his needs known  PAST MEDICAL HISTORY:  Past Medical History:  Diagnosis Date  . Diabetes mellitus without complication ([place])   . Dysarthria   . Dysphagia   . GI bleed   . Hyperlipidemia   . Hypertension   . Renal insufficiency 09/22/2016  . Stroke [facility])     SOCIAL HX:  Social History   Tobacco Use  .  Smoking status: Former Smoker    Packs/day: 0.50    Years: 32.00    Pack years: 16.00    Types: Cigarettes  . Smokeless tobacco: Never Used  . Tobacco comment: Started at age 67 though quit for 10 years at one point  Substance Use Topics  . Alcohol use: No    Alcohol/week: 0.0 standard drinks    ALLERGIES: No Known Allergies   PERTINENT MEDICATIONS:  Outpatient Encounter Medications as  of 08/02/2018  Medication Sig  . acetaminophen (TYLENOL) 325 MG tablet Place 650 mg into feeding tube 2 (two) times daily.  Marland Kitchen allopurinol (ZYLOPRIM) 150 mg TABS tablet Place 150 mg into feeding tube daily.  Marland Kitchen aspirin 325 MG tablet Place 325 mg into feeding tube daily.  Marland Kitchen atenolol (TENORMIN) 50 MG tablet Place 50 mg into feeding tube See admin instructions. 50 mg per tube every 12 hours and hold if Systolic reading is <732  . atorvastatin (LIPITOR) 80 MG tablet Place 80 mg into feeding tube at bedtime.   . bisacodyl (DULCOLAX) 10 MG suppository Place 10 mg rectally daily as needed (for constipation not relieved by Milk of Mag).  . Carboxymethylcellulose Sodium (REFRESH LIQUIGEL) 1 % GEL Place 1 drop into both eyes 3 (three) times daily.  . Cholecalciferol (VITAMIN D-3) 25 MCG (1000 UT) CAPS Place 1,000 Units into feeding tube daily.  . divalproex (DEPAKOTE) 125 MG DR tablet 125 mg See admin instructions. Give 125 mg per tube at bedtime for mood  . folic acid (FOLVITE) 1 MG tablet Place 1 mg into feeding tube daily.   Marland Kitchen gabapentin (NEURONTIN) 100 MG capsule 100 mg See admin instructions. Give 100 mg per Peg Tube at bedtime  . guaifenesin (TUSSIN) 100 MG/5ML syrup 200 mg See admin instructions. Give 200 mg per tube three times a day  . ipratropium-albuterol (DUONEB) 0.5-2.5 (3) MG/3ML SOLN Take 3 mLs by nebulization every 6 (six) hours as needed for up to 2 days (for wheezing, congestion, or shortness of breath).  . loperamide (IMODIUM A-D) 2 MG tablet Place 2-4 mg into feeding tube See admin instructions. Give 4 mg per tube with initial loose stool, then 2 mg after each subsequent loose stool with a max dose of 8 mg/24 hrs  . magnesium hydroxide (MILK OF MAGNESIA) 400 MG/5ML suspension Place 30 mLs into feeding tube daily as needed for mild constipation.  . Menthol, Topical Analgesic, (BIOFREEZE) 4 % GEL Apply 1 application topically See admin instructions. Apply to left hand 2 times a day FOR PAIN  .  Nutritional Supplements (FEEDING SUPPLEMENT, JEVITY 1.2 CAL,) LIQD Place 1,000 mLs into feeding tube continuous.  . OXYGEN Inhale 2 L into the lungs See admin instructions. 2 liters of oxygen as needed for hypoxia and KEEP SAT AT >90%  . QUEtiapine (SEROQUEL) 25 MG tablet Place 25 mg into feeding tube 2 (two) times daily.   . ranitidine (ZANTAC) 15 MG/ML syrup Place 5 mg into feeding tube every 12 (twelve) hours.   . Sodium Phosphates (RA SALINE ENEMA) 19-7 GM/118ML ENEM Place 1 enema rectally daily as needed (for constipation not relieved by Dulcolax suppository and call MD if not relieved by enema).  . tamsulosin (FLOMAX) 0.4 MG CAPS capsule Take 1 capsule (0.4 mg total) by mouth daily.  . Water For Irrigation, Sterile (FREE WATER) SOLN Place 300 mLs into feeding tube 4 (four) times daily.   No facility-administered encounter medications on file as of 08/02/2018.       Karsten Howry K  Olena Heckle, NP

## 2018-08-03 ENCOUNTER — Other Ambulatory Visit: Payer: Self-pay

## 2018-08-03 ENCOUNTER — Encounter (HOSPITAL_COMMUNITY): Payer: Self-pay | Admitting: Emergency Medicine

## 2018-08-03 ENCOUNTER — Other Ambulatory Visit (HOSPITAL_COMMUNITY): Payer: Self-pay | Admitting: Emergency Medicine

## 2018-08-03 ENCOUNTER — Telehealth: Payer: Self-pay | Admitting: Student

## 2018-08-03 ENCOUNTER — Other Ambulatory Visit (HOSPITAL_COMMUNITY): Payer: Medicare Other

## 2018-08-03 ENCOUNTER — Emergency Department (HOSPITAL_COMMUNITY)
Admission: EM | Admit: 2018-08-03 | Discharge: 2018-08-03 | Disposition: A | Discharge status: Home / Self Care | Payer: Medicare Other | Attending: Emergency Medicine | Admitting: Emergency Medicine

## 2018-08-03 DIAGNOSIS — E43 Unspecified severe protein-calorie malnutrition: Secondary | ICD-10-CM | POA: Diagnosis not present

## 2018-08-03 DIAGNOSIS — Z87891 Personal history of nicotine dependence: Secondary | ICD-10-CM | POA: Diagnosis not present

## 2018-08-03 DIAGNOSIS — I129 Hypertensive chronic kidney disease with stage 1 through stage 4 chronic kidney disease, or unspecified chronic kidney disease: Secondary | ICD-10-CM | POA: Diagnosis not present

## 2018-08-03 DIAGNOSIS — R1310 Dysphagia, unspecified: Secondary | ICD-10-CM

## 2018-08-03 DIAGNOSIS — Z79899 Other long term (current) drug therapy: Secondary | ICD-10-CM | POA: Diagnosis not present

## 2018-08-03 DIAGNOSIS — K9429 Other complications of gastrostomy: Secondary | ICD-10-CM | POA: Diagnosis not present

## 2018-08-03 DIAGNOSIS — Z469 Encounter for fitting and adjustment of unspecified device: Secondary | ICD-10-CM

## 2018-08-03 DIAGNOSIS — I69354 Hemiplegia and hemiparesis following cerebral infarction affecting left non-dominant side: Secondary | ICD-10-CM | POA: Diagnosis not present

## 2018-08-03 DIAGNOSIS — F39 Unspecified mood [affective] disorder: Secondary | ICD-10-CM | POA: Diagnosis not present

## 2018-08-03 DIAGNOSIS — K9423 Gastrostomy malfunction: Secondary | ICD-10-CM

## 2018-08-03 DIAGNOSIS — N400 Enlarged prostate without lower urinary tract symptoms: Secondary | ICD-10-CM | POA: Diagnosis not present

## 2018-08-03 DIAGNOSIS — N184 Chronic kidney disease, stage 4 (severe): Secondary | ICD-10-CM | POA: Diagnosis not present

## 2018-08-03 DIAGNOSIS — R404 Transient alteration of awareness: Secondary | ICD-10-CM | POA: Diagnosis not present

## 2018-08-03 DIAGNOSIS — Z4659 Encounter for fitting and adjustment of other gastrointestinal appliance and device: Secondary | ICD-10-CM | POA: Diagnosis not present

## 2018-08-03 DIAGNOSIS — I10 Essential (primary) hypertension: Secondary | ICD-10-CM | POA: Diagnosis not present

## 2018-08-03 DIAGNOSIS — Z8719 Personal history of other diseases of the digestive system: Secondary | ICD-10-CM | POA: Diagnosis not present

## 2018-08-03 DIAGNOSIS — E119 Type 2 diabetes mellitus without complications: Secondary | ICD-10-CM | POA: Insufficient documentation

## 2018-08-03 DIAGNOSIS — E1122 Type 2 diabetes mellitus with diabetic chronic kidney disease: Secondary | ICD-10-CM | POA: Diagnosis not present

## 2018-08-03 DIAGNOSIS — Z960 Presence of urogenital implants: Secondary | ICD-10-CM | POA: Diagnosis not present

## 2018-08-03 DIAGNOSIS — J9610 Chronic respiratory failure, unspecified whether with hypoxia or hypercapnia: Secondary | ICD-10-CM | POA: Diagnosis not present

## 2018-08-03 LAB — MISCELLANEOUS TEST

## 2018-08-03 NOTE — ED Notes (Signed)
Gave report to [facility] in [place] facility. He verbalized understanding discharge instrcutions.

## 2018-08-03 NOTE — Discharge Instructions (Signed)
You will be called by interventional radiology with your appointment time

## 2018-08-03 NOTE — ED Triage Notes (Signed)
Pt BIB [facility] for a displaced g-tube. EMS advised the facility told them the pt pulled out his g-tube at approximately 0220 this date. EMS advised the pt is non-verbal. EMS advised the pt is stable and non-verbal. EMS advised there is no drainage from same and it looks closed up.

## 2018-08-03 NOTE — Telephone Encounter (Signed)
Patient presented to [facility] ED overnight due to G-tube dislodged. Plan was made for patient to return to IR today for replacement.  Discussed with [place] at [facility].  A catheter has been placed in tract for now.  Per Helene Kelp MD, hospice consult to be completed today.  If tube still warranted after family meeting, they will notify Radiology of need to replace.   Brynda Greathouse, MS RD PA-C 9:49 AM

## 2018-08-03 NOTE — ED Notes (Signed)
Called [facility] 

## 2018-08-03 NOTE — ED Notes (Signed)
Pt had bowel movement. Bed pad changed and peri care performed.[facility] transported patient back to facility.

## 2018-08-03 NOTE — ED Provider Notes (Signed)
[facility] EMERGENCY DEPARTMENT Provider Note   CSN: [id number] Arrival date & time: 08/03/18  0962    History   Chief Complaint Chief Complaint  Patient presents with  . G-Tube Displaced    HPI Sean Collins is a 68 y.o. male.     The history is provided by medical records and the EMS personnel. The history is limited by the condition of the patient.  Illness  Location:  Abdominal wall Quality:  Pulled out PEG tube Onset quality:  Gradual Timing:  Constant Progression:  Unchanged Chronicity:  Recurrent Context:  Patient with CVA Relieved by:  Nothing Worsened by:  Nothing Ineffective treatments:  None Associated symptoms: no chest pain and no fever   Risk factors:  Dysphagia   Past Medical History:  Diagnosis Date  . Diabetes mellitus without complication ([place])   . Dysarthria   . Dysphagia   . GI bleed   . Hyperlipidemia   . Hypertension   . Renal insufficiency 09/22/2016  . Stroke [facility])     Patient Active Problem List   Diagnosis Date Noted  . Urinary retention 08/01/2018  . Hyperkalemia 07/27/2018  . Acute kidney injury ([place]) 07/27/2018  . Aphasia   . AKI (acute kidney injury) ([place]) 07/06/2018  . Neoplasm of skin of abdomen 04/23/2018  . Renal insufficiency 09/22/2016  . Type 2 diabetes mellitus with diabetic nephropathy, without long-term current use of insulin ([place]) 09/13/2016  . Depressive psychosis ([place]) 09/13/2016  . Cervical myofascial pain syndrome 08/29/2016  . Cervical facet joint syndrome 08/29/2016  . Recurrent falls 07/11/2016  . HCAP (healthcare-associated pneumonia) 07/05/2016  . Major depressive disorder, recurrent episode ([place]) 04/17/2016  . Shoulder pain, left 02/07/2016  . Essential hypertension 12/08/2015  . Late effects of CVA (cerebrovascular accident) 12/08/2015  . Cerebrovascular accident (CVA) due to thrombosis of right middle cerebral artery ([place])   . Slurred speech   . Malnutrition of moderate  degree 11/04/2015  . Left hemiparesis ([place])   . Dysphagia   . Hyperlipidemia 11/16/2011  . Gout 11/16/2011  . History of stroke 11/16/2011  . CAD (coronary artery disease) 11/16/2011    Past Surgical History:  Procedure Laterality Date  . ESOPHAGOGASTRODUODENOSCOPY (EGD) WITH PROPOFOL N/A 11/03/2015   Procedure: ESOPHAGOGASTRODUODENOSCOPY (EGD) WITH PROPOFOL;  Surgeon: Manus Gunning, MD;  Location: [facility];  Service: Gastroenterology;  Laterality: N/A;  . IR REPLC GASTRO/COLONIC TUBE PERCUT W/FLUORO  12/29/2016        Home Medications    Prior to Admission medications   Medication Sig Start Date End Date Taking? Authorizing Provider  acetaminophen (TYLENOL) 325 MG tablet Place 650 mg into feeding tube 2 (two) times daily.    [provider]  allopurinol (ZYLOPRIM) 150 mg TABS tablet Place 150 mg into feeding tube daily.    [provider]  aspirin 325 MG tablet Place 325 mg into feeding tube daily.    [provider]  atenolol (TENORMIN) 50 MG tablet Place 50 mg into feeding tube See admin instructions. 50 mg per tube every 12 hours and hold if Systolic reading is <836    [provider]  atorvastatin (LIPITOR) 80 MG tablet Place 80 mg into feeding tube at bedtime.     [provider]  bisacodyl (DULCOLAX) 10 MG suppository Place 10 mg rectally daily as needed (for constipation not relieved by Milk of Mag).    [provider]  Carboxymethylcellulose Sodium (REFRESH LIQUIGEL) 1 % GEL Place 1 drop into  both eyes 3 (three) times daily.    [provider]  Cholecalciferol (VITAMIN D-3) 25 MCG (1000 UT) CAPS Place 1,000 Units into feeding tube daily.    [provider]  divalproex (DEPAKOTE) 125 MG DR tablet 125 mg See admin instructions. Give 125 mg per tube at bedtime for mood    [provider]  folic acid (FOLVITE) 1 MG tablet Place 1 mg into feeding tube daily.     [provider]   gabapentin (NEURONTIN) 100 MG capsule 100 mg See admin instructions. Give 100 mg per Peg Tube at bedtime    [provider]  guaifenesin (TUSSIN) 100 MG/5ML syrup 200 mg See admin instructions. Give 200 mg per tube three times a day    [provider]  ipratropium-albuterol (DUONEB) 0.5-2.5 (3) MG/3ML SOLN Take 3 mLs by nebulization every 6 (six) hours as needed for up to 2 days (for wheezing, congestion, or shortness of breath). 08/01/18 08/03/18  DanfordSuann Larry, MD  loperamide (IMODIUM A-D) 2 MG tablet Place 2-4 mg into feeding tube See admin instructions. Give 4 mg per tube with initial loose stool, then 2 mg after each subsequent loose stool with a max dose of 8 mg/24 hrs    [provider]  magnesium hydroxide (MILK OF MAGNESIA) 400 MG/5ML suspension Place 30 mLs into feeding tube daily as needed for mild constipation.    [provider]  Menthol, Topical Analgesic, (BIOFREEZE) 4 % GEL Apply 1 application topically See admin instructions. Apply to left hand 2 times a day FOR PAIN    [provider]  Nutritional Supplements (FEEDING SUPPLEMENT, JEVITY 1.2 CAL,) LIQD Place 1,000 mLs into feeding tube continuous. 08/01/18   Danford, Suann Larry, MD  OXYGEN Inhale 2 L into the lungs See admin instructions. 2 liters of oxygen as needed for hypoxia and KEEP SAT AT >90%    [provider]  QUEtiapine (SEROQUEL) 25 MG tablet Place 25 mg into feeding tube 2 (two) times daily.     [provider]  ranitidine (ZANTAC) 15 MG/ML syrup Place 5 mg into feeding tube every 12 (twelve) hours.     [provider]  Sodium Phosphates (RA SALINE ENEMA) 19-7 GM/118ML ENEM Place 1 enema rectally daily as needed (for constipation not relieved by Dulcolax suppository and call MD if not relieved by enema).    [provider]  tamsulosin (FLOMAX) 0.4 MG CAPS capsule Take 1 capsule (0.4 mg total) by mouth daily. 08/02/18   Danford,  Suann Larry, MD  Water For Irrigation, Sterile (FREE WATER) SOLN Place 300 mLs into feeding tube 4 (four) times daily. 08/01/18   Danford, Suann Larry, MD    Family History Family History  Problem Relation Age of Onset  . Stroke Brother   . Diabetes Brother   . Stroke Brother   . Stroke Maternal Aunt     Social History Social History   Tobacco Use  . Smoking status: Former Smoker    Packs/day: 0.50    Years: 32.00    Pack years: 16.00    Types: Cigarettes  . Smokeless tobacco: Never Used  . Tobacco comment: Started at age 54 though quit for 10 years at one point  Substance Use Topics  . Alcohol use: No    Alcohol/week: 0.0 standard drinks  . Drug use: No     Allergies   Scopolamine   Review of Systems Review of Systems  Unable to perform ROS: Other  Constitutional:  Negative for fever.  Cardiovascular: Negative for chest pain.     Physical Exam Updated Vital Signs Ht 6\' 2"  (1.88 m)   Wt 80.7 kg   BMI 22.84 kg/m   Physical Exam Vitals signs and nursing note reviewed.  Constitutional:      General: He is not in acute distress.    Appearance: He is normal weight.  HENT:     Head: Normocephalic and atraumatic.     Nose: Nose normal.  Eyes:     Conjunctiva/sclera: Conjunctivae normal.     Pupils: Pupils are equal, round, and reactive to light.  Neck:     Musculoskeletal: Normal range of motion and neck supple.  Cardiovascular:     Rate and Rhythm: Normal rate and regular rhythm.     Pulses: Normal pulses.     Heart sounds: Normal heart sounds.  Pulmonary:     Effort: Pulmonary effort is normal. No respiratory distress.     Breath sounds: Normal breath sounds. No stridor. No wheezing, rhonchi or rales.  Chest:     Chest wall: No tenderness.  Abdominal:     General: Abdomen is flat. Bowel sounds are normal.     Comments: Tract closed  Musculoskeletal: Normal range of motion.  Skin:    General: Skin is warm and dry.     Capillary Refill:  Capillary refill takes less than 2 seconds.  Neurological:     Mental Status: He is alert.     Deep Tendon Reflexes: Reflexes normal.  Psychiatric:     Comments: unable      ED Treatments / Results  Labs (all labs ordered are listed, but only abnormal results are displayed) Labs Reviewed - No data to display  EKG None  Radiology Dg Abdomen Peg Tube Location  Result Date: 08/01/2018 CLINICAL DATA:  Check location of PEG tube. EXAM: ABDOMEN - 1 VIEW COMPARISON:  03/29/2018. FINDINGS: 30 mL of Omnipaque 300 was injected via PEG tube to check location. There is no extraluminal spread of contrast. There is good opacification of the stomach and proximal small bowel. Incidental inferior vena cava filter. IMPRESSION: Satisfactory PEG tube placement. Electronically Signed   By: Staci Righter M.D.   On: 08/01/2018 10:43    Procedures Procedures (including critical care time)  Medications Ordered in ED   Case d/w Dr. Kathlene Cote, patient to be called with time for procedure.    Give patient is elderly and in the advent of current pandemic it is safer for patient to be at his facility.  Will d/c  To facility and he will called.    Final Clinical Impressions(s) / ED Diagnoses   Return for intractable cough, coughing up blood,fevers >100.4 unrelieved by medication, shortness of breath, intractable vomiting, chest pain, shortness of breath, weakness,numbness, changes in speech, facial asymmetry,abdominal pain, passing out,Inability to tolerate liquids or food, cough, altered mental status or any concerns. No signs of systemic illness or infection. The patient is nontoxic-appearing on exam and vital signs are within normal limits.   I have reviewed the triage vital signs and the nursing notes. Pertinent labs &imaging results that were available during my care of the patient were reviewed by me and considered in my medical decision making (see chart for details).  After history, exam,  and medical workup I feel the patient has been appropriately medically screened and is safe for discharge home. Pertinent diagnoses were discussed with the patient. Patient was given return precautions.   Brigid Vandekamp, MD 08/03/18  0344  

## 2018-08-04 ENCOUNTER — Ambulatory Visit (HOSPITAL_COMMUNITY)
Admission: RE | Admit: 2018-08-04 | Discharge: 2018-08-04 | Disposition: A | Discharge status: Home / Self Care | Source: Ambulatory Visit | Attending: Emergency Medicine | Admitting: Emergency Medicine

## 2018-08-04 ENCOUNTER — Encounter (HOSPITAL_COMMUNITY): Payer: Self-pay | Admitting: Interventional Radiology

## 2018-08-04 DIAGNOSIS — R1310 Dysphagia, unspecified: Secondary | ICD-10-CM

## 2018-08-04 DIAGNOSIS — Z431 Encounter for attention to gastrostomy: Secondary | ICD-10-CM | POA: Insufficient documentation

## 2018-08-04 DIAGNOSIS — R5381 Other malaise: Secondary | ICD-10-CM | POA: Diagnosis not present

## 2018-08-04 DIAGNOSIS — G459 Transient cerebral ischemic attack, unspecified: Secondary | ICD-10-CM | POA: Diagnosis not present

## 2018-08-04 DIAGNOSIS — M2550 Pain in unspecified joint: Secondary | ICD-10-CM | POA: Diagnosis not present

## 2018-08-04 DIAGNOSIS — Z7401 Bed confinement status: Secondary | ICD-10-CM | POA: Diagnosis not present

## 2018-08-04 HISTORY — PX: IR REPLC GASTRO/COLONIC TUBE PERCUT W/FLUORO: IMG2333

## 2018-08-04 MED ORDER — LIDOCAINE VISCOUS HCL 2 % MT SOLN
OROMUCOSAL | Status: AC
  Filled 2018-08-04: qty 15

## 2018-08-04 MED ORDER — IOHEXOL 300 MG/ML  SOLN
50.0000 mL | Freq: Once | INTRAMUSCULAR | Status: AC | PRN
  Administered 2018-08-04: 10 mL via INTRAVENOUS

## 2018-08-06 ENCOUNTER — Non-Acute Institutional Stay (SKILLED_NURSING_FACILITY): Payer: Medicare Other | Admitting: Internal Medicine

## 2018-08-06 ENCOUNTER — Encounter: Payer: Self-pay | Admitting: Internal Medicine

## 2018-08-06 DIAGNOSIS — R627 Adult failure to thrive: Secondary | ICD-10-CM

## 2018-08-06 DIAGNOSIS — E440 Moderate protein-calorie malnutrition: Secondary | ICD-10-CM | POA: Diagnosis not present

## 2018-08-06 LAB — NOVEL CORONAVIRUS, NAA (HOSP ORDER, SEND-OUT TO REF LAB; TAT 18-24 HRS)

## 2018-08-06 LAB — NOVEL CORONAVIRUS, NAA (HOSPITAL ORDER, SEND-OUT TO REF LAB): SARS-COV-2, NAA: NOT DETECTED

## 2018-08-06 NOTE — Progress Notes (Signed)
   NURSING HOME LOCATION:  [facility] ROOM NUMBER: 302/A   CODE STATUS: Full Code   PCP:  [name] MD.  This is a nursing facility follow up for specific acute issue of medication reconciliation in the setting of Hospice designation.  Interim medical record and care since last [place] visit was updated with review of diagnostic studies and change in clinical status since last visit were documented.  HPI: The patient was seen in the ED 3/28 for G-tube displacement.  The circumstances of the G-tube coming out is unclear.  Interventional Radiology was consulted and they were to schedule replacement; he returned to the facility until that procedure was completed on 3/29 by Dr. Kathlene Cote.  68 French balloon retention catheter was placed. Initially he seemingly indicated that he did pull the G-tube out on purpose; but shortly thereafter he denied this.  His nonverbal state profoundly hinders obtaining adequate history. Palliative care and Hospice have consulted. [facility] NP has discussed code status on several occasions as patient expressed desire to not return to the hospital. She has consulted with his brother on several occasions as well. The patient will be referred to Hospice but will remain a full code for the time being.  Hospice nurse and I discussed this case; she will discuss the CODE STATUS again with his brother.  She will review medication list and it will be reconciled in accordance with hospice protocols.  Review of systems: As noted he is essentially nonverbal.  Answers to queries did very.  His responses were very slow.  He did follow some commands albeit slowly as well.  Constitutional: No fever Cardiovascular: No chest pain, palpitations, paroxysmal nocturnal dyspnea, claudication, edema  Respiratory: No cough, sputum production, hemoptysis   Gastrointestinal: No heartburn, dysphagia, abdominal pain, nausea /vomiting, rectal bleeding, melena, change in bowels  Genitourinary: No dysuria, hematuria, pyuria, incontinence, nocturia  Physical exam:  Pertinent or positive findings: He exhibits a blank stare.  Teeth are coated with heavy plaque burden.  Coarse rhonchi are present bilaterally with partial obscuration of the heart sounds.  His rhonchi sound markedly less "juicy" than previously.  PEG tube is in place.  Hemiparesis is present on the left.  Pedal pulses are decreased.  He has trace-1/2+ edema at the medial malleoli.  He was able to raise the correct finger count on the right hand upon command.  General appearance: Adequately nourished; no acute distress, increased work of breathing is present.   Lymphatic: No lymphadenopathy about the head, neck, axilla. Eyes: No conjunctival inflammation or lid edema is present. There is no scleral icterus. Ears:  External ear exam shows no significant lesions or deformities.   Nose:  External nasal examination shows no deformity or inflammation. Nasal mucosa are pink and moist without lesions, exudates Oral exam:  Lips and gums are healthy appearing. There is no oropharyngeal erythema or exudate. Neck:  No thyromegaly, masses, tenderness noted.    Heart:  No gallop, murmur, click, rub .  Lungs: without wheezes,rales, rubs. Abdomen: Bowel sounds are normal. Abdomen is soft and nontender with no organomegaly, hernias, masses. GU: Deferred  Extremities:  No cyanosis, clubbing Skin: Warm & dry w/o tenting. No significant lesions or rash.  See summary under each active problem in the Problem List with associated updated therapeutic plan

## 2018-08-06 NOTE — Assessment & Plan Note (Addendum)
Medication reconciliation with elimination of any marginally efficacious drugs or superfluous medications

## 2018-08-06 NOTE — Assessment & Plan Note (Signed)
Need for G-tube to decrease risk of aspiration pneumonia and provide medications and nutrition discussed with the patient

## 2018-08-07 DIAGNOSIS — N400 Enlarged prostate without lower urinary tract symptoms: Secondary | ICD-10-CM | POA: Diagnosis not present

## 2018-08-07 DIAGNOSIS — J9610 Chronic respiratory failure, unspecified whether with hypoxia or hypercapnia: Secondary | ICD-10-CM | POA: Diagnosis not present

## 2018-08-07 DIAGNOSIS — Z960 Presence of urogenital implants: Secondary | ICD-10-CM | POA: Diagnosis not present

## 2018-08-07 DIAGNOSIS — N184 Chronic kidney disease, stage 4 (severe): Secondary | ICD-10-CM | POA: Diagnosis not present

## 2018-08-07 DIAGNOSIS — E43 Unspecified severe protein-calorie malnutrition: Secondary | ICD-10-CM | POA: Diagnosis not present

## 2018-08-07 DIAGNOSIS — R1310 Dysphagia, unspecified: Secondary | ICD-10-CM | POA: Diagnosis not present

## 2018-08-07 DIAGNOSIS — Z8719 Personal history of other diseases of the digestive system: Secondary | ICD-10-CM | POA: Diagnosis not present

## 2018-08-07 DIAGNOSIS — I129 Hypertensive chronic kidney disease with stage 1 through stage 4 chronic kidney disease, or unspecified chronic kidney disease: Secondary | ICD-10-CM | POA: Diagnosis not present

## 2018-08-07 DIAGNOSIS — F39 Unspecified mood [affective] disorder: Secondary | ICD-10-CM | POA: Diagnosis not present

## 2018-08-07 DIAGNOSIS — E1122 Type 2 diabetes mellitus with diabetic chronic kidney disease: Secondary | ICD-10-CM | POA: Diagnosis not present

## 2018-08-07 DIAGNOSIS — I69354 Hemiplegia and hemiparesis following cerebral infarction affecting left non-dominant side: Secondary | ICD-10-CM | POA: Diagnosis not present

## 2018-08-09 ENCOUNTER — Encounter: Payer: Self-pay | Admitting: Internal Medicine

## 2018-08-09 ENCOUNTER — Non-Acute Institutional Stay (SKILLED_NURSING_FACILITY): Payer: Medicare Other | Admitting: Internal Medicine

## 2018-08-09 DIAGNOSIS — R627 Adult failure to thrive: Secondary | ICD-10-CM | POA: Diagnosis not present

## 2018-08-09 DIAGNOSIS — I69354 Hemiplegia and hemiparesis following cerebral infarction affecting left non-dominant side: Secondary | ICD-10-CM | POA: Diagnosis not present

## 2018-08-09 DIAGNOSIS — F320 Major depressive disorder, single episode, mild: Secondary | ICD-10-CM | POA: Diagnosis not present

## 2018-08-09 DIAGNOSIS — R1310 Dysphagia, unspecified: Secondary | ICD-10-CM | POA: Diagnosis not present

## 2018-08-09 DIAGNOSIS — E1122 Type 2 diabetes mellitus with diabetic chronic kidney disease: Secondary | ICD-10-CM | POA: Diagnosis not present

## 2018-08-09 DIAGNOSIS — J9610 Chronic respiratory failure, unspecified whether with hypoxia or hypercapnia: Secondary | ICD-10-CM | POA: Diagnosis not present

## 2018-08-09 DIAGNOSIS — I129 Hypertensive chronic kidney disease with stage 1 through stage 4 chronic kidney disease, or unspecified chronic kidney disease: Secondary | ICD-10-CM | POA: Diagnosis not present

## 2018-08-09 DIAGNOSIS — E43 Unspecified severe protein-calorie malnutrition: Secondary | ICD-10-CM | POA: Diagnosis not present

## 2018-08-09 NOTE — Progress Notes (Signed)
Location:    [facility] Room Number: 277/A Place of Service:  SNF [phone]) Provider:  Loraine Maple, MD  Patient Care Team: [name], MD as PCP - General (Internal Medicine) [place] as Referring Physician (General Practice) Letta Pate Luanna Salk, MD as Consulting Physician (Physical Medicine and Rehabilitation) Rosalin Hawking, MD as Consulting Physician (Neurology)  Extended Emergency Contact Information Primary Emergency Contact: Ivan Anchors [place] of [place] Phone: [phone] Relation: Brother  Code Status:  Full Code Goals of care: Advanced Directive information Advanced Directives 08/09/2018  Does Patient Have a Medical Advance Directive? Yes  Type of Advance Directive (No Data)  Does patient want to make changes to medical advance directive? No - Patient declined  Copy of [place] in Chart? No - copy requested  Would patient like information on creating a medical advance directive? No - Patient declined     Chief Complaint  Patient presents with  . Acute Visit    Possible Depression    HPI:  Pt is a 68 y.o. male seen today for an acute visit for follow-up of possible depression.  Patient is a long-term resident of of facility is currently under hospice care secondary to failure to thrive with history of aspiration pneumonia.  And dehydration. With history of CVA with right-sided hemiparesis as well as hypertension type 2 diabetes.  Patient's brother has stated to staff he thinks Sean Collins is somewhat depressed and would like something for that if possible.  Currently patient is sitting in his wheelchair comfortably is nonverbal and per nursing staff appears to actually be somewhat improved and feeling better than previously.  Continues to receive tube feedings.  Vital signs appear to be stable--- I am able to communicate some with him some with head nods-he  indicates he is not hurting- does have somewhat of a flat affect again this is difficult to fully assess because of his nonverbal status- but per his brother apparently has exhibited somewhat more depressive symptoms-       Past Medical History:  Diagnosis Date  . Diabetes mellitus without complication ([place])   . Dysarthria   . Dysphagia   . GI bleed   . Hyperlipidemia   . Hypertension   . Renal insufficiency 09/22/2016  . Stroke [facility])    Past Surgical History:  Procedure Laterality Date  . ESOPHAGOGASTRODUODENOSCOPY (EGD) WITH PROPOFOL N/A 11/03/2015   Procedure: ESOPHAGOGASTRODUODENOSCOPY (EGD) WITH PROPOFOL;  Surgeon: Manus Gunning, MD;  Location: [place];  Service: Gastroenterology;  Laterality: N/A;  . IR REPLC GASTRO/COLONIC TUBE PERCUT W/FLUORO  12/29/2016  . IR REPLC GASTRO/COLONIC TUBE PERCUT W/FLUORO  08/04/2018    Allergies  Allergen Reactions  . Scopolamine     Urinary retention    Outpatient Encounter Medications as of 08/09/2018  Medication Sig  . acetaminophen (TYLENOL) 325 MG tablet Place 650 mg into feeding tube. VIA TUBE 4 TIMES A DAY FOR PAIN  . allopurinol (ZYLOPRIM) 150 mg TABS tablet Place 150 mg into feeding tube daily.  Marland Kitchen amLODipine (NORVASC) 5 MG tablet TAKE 1 TABLET VIA TUBE ONCE DAILY  . aspirin 325 MG tablet Place 325 mg into feeding tube daily.  Marland Kitchen atenolol (TENORMIN) 50 MG tablet Place 50 mg into feeding tube See admin instructions. 50 mg per tube every 12 hours and hold if Systolic reading is <470  . atorvastatin (LIPITOR) 80 MG tablet Place 80 mg into feeding tube  at bedtime.   . bisacodyl (DULCOLAX) 10 MG suppository Place 10 mg rectally daily as needed (for constipation not relieved by Milk of Mag).  . Carboxymethylcellulose Sodium (REFRESH LIQUIGEL) 1 % GEL Place 1 drop into both eyes 3 (three) times daily.  . divalproex (DEPAKOTE) 125 MG DR tablet 125 mg See admin instructions. Give 125 mg per tube at bedtime for mood  . guaifenesin  (TUSSIN) 100 MG/5ML syrup 200 mg See admin instructions. Give 200 mg per tube four times a day  . haloperidol (HALDOL) 5 MG tablet GIVE 2.5MG  (0.5ML) IF AGITATION CONTINUES. THIS ORDER IS A ONE TIME DOSE. D/C ONCE GIVEN  . ipratropium-albuterol (DUONEB) 0.5-2.5 (3) MG/3ML SOLN Take 3 mLs by nebulization. INHALE 1 VIAL VIA NEBULIZER 4 TIMES A DAY FOR COPD  . LORazepam (ATIVAN) 0.5 MG tablet GIVE 1 TABLET BY MOUTH OR PEG EVERY 4 HOURS AS NEEDED FOR AGITATION  . magnesium hydroxide (MILK OF MAGNESIA) 400 MG/5ML suspension Place 30 mLs into feeding tube daily as needed for mild constipation.  . Nutritional Supplements (JEVITY PO) Place into feeding tube. DIABETA SOURCE AT 8O ML / HR VIA G/T CONTINUOUSLY FOR NUTRITION  . OXYGEN Inhale 2 L into the lungs See admin instructions. 2 liters of oxygen as needed for hypoxia and KEEP SAT AT >90%  . QUEtiapine (SEROQUEL) 25 MG tablet Place 25 mg into feeding tube 2 (two) times daily.   . Sodium Phosphates (RA SALINE ENEMA) 19-7 GM/118ML ENEM Place 1 enema rectally daily as needed (for constipation not relieved by Dulcolax suppository and call MD if not relieved by enema).  . tamsulosin (FLOMAX) 0.4 MG CAPS capsule Take 1 capsule (0.4 mg total) by mouth daily.  . Water For Irrigation, Sterile (FREE WATER) SOLN Place 300 mLs into feeding tube 4 (four) times daily.  . [DISCONTINUED] Cholecalciferol (VITAMIN D-3) 25 MCG (1000 UT) CAPS Place 1,000 Units into feeding tube daily.  . [DISCONTINUED] folic acid (FOLVITE) 1 MG tablet Place 1 mg into feeding tube daily.   . [DISCONTINUED] gabapentin (NEURONTIN) 100 MG capsule 100 mg See admin instructions. Give 100 mg per Peg Tube at bedtime  . [DISCONTINUED] loratadine (CLARITIN) 10 MG tablet Take 10 mg by mouth daily. FOR ALLERGY SYMPTOMS  . [DISCONTINUED] Nutritional Supplements (FEEDING SUPPLEMENT, JEVITY 1.2 CAL,) LIQD Place 1,000 mLs into feeding tube continuous. (Patient taking differently: Place into feeding tube.  DIABETA SOURCE AT 8O ML / HR VIA G/T CONTINUOUSLY FOR NUTRITION)  . [DISCONTINUED] ranitidine (ZANTAC) 15 MG/ML syrup Place 5 mg into feeding tube every 12 (twelve) hours.    No facility-administered encounter medications on file as of 08/09/2018.     Review of Systems  Essentially unobtainable secondary to patient's nonverbal status please see HPI  Immunization History  Administered Date(s) Administered  . Influenza-Unspecified 06/21/2015, 03/01/2018  . PPD Test 11/08/2015  . Pneumococcal Conjugate-13 09/06/2017, 03/16/2018  . Pneumococcal-Unspecified 06/21/2015   Pertinent  Health Maintenance Due  Topic Date Due  . URINE MICROALBUMIN  08/11/2018 (Originally 10/23/2016)  . COLONOSCOPY  09/05/2018 (Originally 10/15/2000)  . OPHTHALMOLOGY EXAM  10/06/2018  . FOOT EXAM  11/03/2018  . INFLUENZA VACCINE  12/07/2018  . HEMOGLOBIN A1C  01/27/2019  . PNA vac Low Risk Adult (2 of 2 - PPSV23) 06/20/2020   Fall Risk  01/03/2018 03/09/2017 01/02/2017 11/15/2016 10/13/2016  Falls in the past year? Yes Exclusion - non ambulatory Yes No Yes  Comment - - - - -  Number falls in past yr: 1 - 2  or more - 2 or more  Comment - - - - -  Injury with Fall? Yes - No - No  Risk Factor Category  - - - - High Fall Risk  Risk for fall due to : - - - - -  Follow up - - - - -   Functional Status Survey:    Vitals:   08/09/18 1428  BP: 134/74  Pulse: 68  Resp: 20  Temp: 98.8 F (37.1 C)  TempSrc: Oral  SpO2: 95%    Physical Exam  In general this is a frail appearing elderly male does not appear to be in any distress sitting comfortably in his wheelchair.  His skin is warm and dry.  Eyes visual acuity appears to be intact he does make eye contact.  Oropharynx notable for coating on his teeth apparently is baseline.  Chest continues to have some mild rhonchi bilaterally there is no labored breathing.  Heart is regular rate and rhythm without murmur gallop or rub he has minimal edema  Abdomen he  does have a PEG tube in place abdomen is soft does not appear to be tender with positive bowel sounds.  Musculoskeletal has left-sided hemiparesis is able to move his right upper extremity to some extent.  Neurologic as noted above he is alert does respond somewhat to verbal commands.  Psych he does have somewhat of a flat affect he appears to be pleasant and cooperative    Labs reviewed: Recent Labs    07/08/18 0555  07/09/18 0510  07/10/18 0540  07/30/18 0734 07/31/18 0246 08/01/18 0612  NA 150*   < > 141   < > 141   < > 154* 154* 147*  K 4.2   < > 4.0   < > 4.3   < > 4.2 4.3 5.6*  CL 120*   < > 114*   < > 111   < > 121* 126* 117*  CO2 23   < > 23   < > 23   < > 21* 23 22  GLUCOSE 214*   < > 219*   < > 192*   < > 132* 121* 98  BUN 53*   < > 32*   < > 28*   < > 55* 50* 32*  CREATININE 1.30*   < > 1.23   < > 1.17   < > 1.55* 1.36* 1.18  CALCIUM 8.3*   < > 8.0*   < > 8.2*   < > 9.4 8.8* 8.6*  MG 2.5*  --  2.0  --  2.1  --   --   --   --    < > = values in this interval not displayed.   Recent Labs    01/23/18 05/22/18 07/27/18 1314  AST 29 29 43*  ALT 32 30 26  ALKPHOS 137* 132* 78  BILITOT  --   --  0.4  PROT  --   --  7.0  ALBUMIN  --   --  2.0*   Recent Labs    07/06/18 0250  07/08/18 0555  07/27/18 1314 07/28/18 0316 07/31/18 1023  WBC 16.9*   < > 10.9*   < > 11.4* 13.1* 13.1*  NEUTROABS 15.2*  --  6.0  --  10.1*  --   --   HGB 16.2   < > 14.6   < > 9.9* 10.7* 11.9*  HCT 52.6*   < > 46.0   < > 32.2* 33.7*  37.0*  MCV 91.6   < > 90.9   < > 91.2 91.1 90.9  PLT PLATELET CLUMPS NOTED ON SMEAR, COUNT APPEARS DECREASED   < > 90*   < > 134* 142* 276   < > = values in this interval not displayed.   Lab Results  Component Value Date   TSH 2.56 10/25/2017   Lab Results  Component Value Date   HGBA1C 6.8 (H) 07/27/2018   Lab Results  Component Value Date   CHOL 96 10/25/2017   HDL 29 (A) 10/25/2017   LDLCALC 50 10/25/2017   TRIG 86 10/25/2017   CHOLHDL 3.2  10/24/2015    Significant Diagnostic Results in last 30 days:  Ir Replc Gastro/colonic Tube Percut W/fluoro  Result Date: 08/04/2018 INDICATION: Dislodged gastrostomy tube requiring replacement. EXAM: GASTROSTOMY TUBE REPLACEMENT UNDER FLUOROSCOPY MEDICATIONS: None ANESTHESIA/SEDATION: None CONTRAST:  10 mL Omnipaque 300-administered into the gastric lumen. FLUOROSCOPY TIME:  Fluoroscopy Time: 6 seconds.  0.4 mGy. COMPLICATIONS: None immediate. PROCEDURE: The old gastrostomy tube exit site was probed initially with a balloon retention gastrostomy tube. A 5 French catheter was advanced through the tract and into the gastric lumen. A guidewire was placed and the catheter removed. The tract was dilated to 15 [place]. An 16 French balloon retention gastrostomy tube was advanced over the wire. The retention balloon was inflated with 8 mL of saline. The tube was injected with contrast material under fluoroscopy and a fluoroscopic spot image saved to confirm tube position. FINDINGS: A new tube was able to be advanced through the old tract with the tip in the distal body of the stomach. IMPRESSION: Gastrostomy tube successfully replaced with a new 18 French balloon retention catheter placed into the body of the stomach. Electronically Signed   By: Aletta Edouard M.D.   On: 08/04/2018 12:17   Dg Abdomen Peg Tube Location  Result Date: 08/01/2018 CLINICAL DATA:  Check location of PEG tube. EXAM: ABDOMEN - 1 VIEW COMPARISON:  03/29/2018. FINDINGS: 30 mL of Omnipaque 300 was injected via PEG tube to check location. There is no extraluminal spread of contrast. There is good opacification of the stomach and proximal small bowel. Incidental inferior vena cava filter. IMPRESSION: Satisfactory PEG tube placement. Electronically Signed   By: Staci Righter M.D.   On: 08/01/2018 10:43   Dg Chest Port 1 View  Result Date: 07/27/2018 CLINICAL DATA:  Onset fever, cough and shortness of breath this morning. EXAM: PORTABLE  CHEST 1 VIEW COMPARISON:  PA and lateral chest 07/07/2018 12/26/2015. FINDINGS: Lung volumes are low. Mild subsegmental atelectasis is seen in the bases. Heart size is enlarged. No pneumothorax or pleural effusion. IMPRESSION: Bibasilar subsegmental atelectasis. Cardiomegaly. Electronically Signed   By: Inge Rise M.D.   On: 07/27/2018 14:23    Assessment/Plan  #1 history of depression- somewhat difficult assessment because patient is nonverbal but per brother apparently has expressed a flatter affect and more depressive symptoms-which is understandable-will start him on Lexapro and monitor- continues under hospice care and hospice nurse did bring this concern about the depression as well.  2.  History of failure to thrive he continues under hospice care at this point appears to be pain-free and comfortable but this will have to be monitored- at this point continues to be full code- and hospice is following this.   [id number] note greater than 25 minutes spent assessing patient discussing his status with nursing staff as well as with the hospice nurse via phone- and coordinating a plan  of care- of note greater than 50% of time spent coordinating a plan of care with input as noted above

## 2018-08-09 NOTE — Patient Instructions (Signed)
See assessment and plan under each diagnosis in the problem list and acutely for this visit 

## 2018-08-11 ENCOUNTER — Encounter: Payer: Self-pay | Admitting: Internal Medicine

## 2018-08-16 DIAGNOSIS — F39 Unspecified mood [affective] disorder: Secondary | ICD-10-CM | POA: Diagnosis not present

## 2018-08-16 DIAGNOSIS — F419 Anxiety disorder, unspecified: Secondary | ICD-10-CM | POA: Diagnosis not present

## 2018-08-16 DIAGNOSIS — F339 Major depressive disorder, recurrent, unspecified: Secondary | ICD-10-CM | POA: Diagnosis not present

## 2018-08-16 DIAGNOSIS — Z515 Encounter for palliative care: Secondary | ICD-10-CM | POA: Diagnosis not present

## 2018-08-22 ENCOUNTER — Encounter: Payer: Self-pay | Admitting: Internal Medicine

## 2018-08-22 DIAGNOSIS — E1122 Type 2 diabetes mellitus with diabetic chronic kidney disease: Secondary | ICD-10-CM | POA: Diagnosis not present

## 2018-08-22 DIAGNOSIS — J9610 Chronic respiratory failure, unspecified whether with hypoxia or hypercapnia: Secondary | ICD-10-CM | POA: Diagnosis not present

## 2018-08-22 DIAGNOSIS — E43 Unspecified severe protein-calorie malnutrition: Secondary | ICD-10-CM | POA: Diagnosis not present

## 2018-08-22 DIAGNOSIS — R1310 Dysphagia, unspecified: Secondary | ICD-10-CM | POA: Diagnosis not present

## 2018-08-22 DIAGNOSIS — I129 Hypertensive chronic kidney disease with stage 1 through stage 4 chronic kidney disease, or unspecified chronic kidney disease: Secondary | ICD-10-CM | POA: Diagnosis not present

## 2018-08-22 DIAGNOSIS — I69354 Hemiplegia and hemiparesis following cerebral infarction affecting left non-dominant side: Secondary | ICD-10-CM | POA: Diagnosis not present

## 2018-08-22 NOTE — Progress Notes (Signed)
Location:    [facility] Room Number: 735/H Place of Service:  SNF [phone]) Provider: Granville Lewis PA-C  [name], MD  Patient Care Team: [name], MD as PCP - General (Internal Medicine) [place] as Referring Physician (General Practice) Letta Pate Luanna Salk, MD as Consulting Physician (Physical Medicine and Rehabilitation) Rosalin Hawking, MD as Consulting Physician (Neurology)  Extended Emergency Contact Information Primary Emergency Contact: Ivan Anchors [place] of [place] Phone: [phone] Relation: Brother  Code Status:  Full Code Goals of care: Advanced Directive information Advanced Directives 08/22/2018  Does Patient Have a Medical Advance Directive? Yes  Type of Advance Directive (No Data)  Does patient want to make changes to medical advance directive? No - Patient declined  Copy of [place] in Chart? No - copy requested  Would patient like information on creating a medical advance directive? No - Patient declined     Chief Complaint  Patient presents with   Medical Management of Chronic Issues    Routine visit of medical management   Best Practice Recommendations    Urine Mircoalbumin    HPI:  Pt is a 68 y.o. male seen today for medical management of chronic diseases.     Past Medical History:  Diagnosis Date   Diabetes mellitus without complication ([place])    Dysarthria    Dysphagia    GI bleed    Hyperlipidemia    Hypertension    Renal insufficiency 09/22/2016   Stroke [facility])    Past Surgical History:  Procedure Laterality Date   ESOPHAGOGASTRODUODENOSCOPY (EGD) WITH PROPOFOL N/A 11/03/2015   Procedure: ESOPHAGOGASTRODUODENOSCOPY (EGD) WITH PROPOFOL;  Surgeon: Manus Gunning, MD;  Location: [facility];  Service: Gastroenterology;  Laterality: N/A;   IR REPLC GASTRO/COLONIC TUBE PERCUT W/FLUORO  12/29/2016   IR REPLC GASTRO/COLONIC TUBE  PERCUT W/FLUORO  08/04/2018    Allergies  Allergen Reactions   Scopolamine     Urinary retention    Allergies as of 08/22/2018      Reactions   Scopolamine    Urinary retention      Medication List       Accurate as of August 22, 2018 12:01 PM. Always use your most recent med list.        acetaminophen 325 MG tablet Commonly known as:  TYLENOL Place 650 mg into feeding tube. VIA TUBE 4 TIMES A DAY FOR PAIN   allopurinol 150 mg Tabs tablet Commonly known as:  ZYLOPRIM Place 150 mg into feeding tube daily.   amLODipine 5 MG tablet Commonly known as:  NORVASC TAKE 1 TABLET VIA TUBE ONCE DAILY   aspirin 325 MG tablet Place 325 mg into feeding tube daily.   atenolol 50 MG tablet Commonly known as:  TENORMIN Place 50 mg into feeding tube See admin instructions. 50 mg per tube every 12 hours and hold if Systolic reading is <622   atorvastatin 80 MG tablet Commonly known as:  LIPITOR Place 80 mg into feeding tube at bedtime.   divalproex 125 MG DR tablet Commonly known as:  DEPAKOTE 125 mg See admin instructions. Give 125 mg per tube at bedtime for mood   escitalopram 5 MG tablet Commonly known as:  LEXAPRO Take 5 mg by mouth daily. For Depression   free water Soln Place 300 mLs into feeding tube 4 (four) times daily.   haloperidol 5 MG tablet Commonly known as:  HALDOL GIVE 2.5MG  (  0.5ML) IF AGITATION CONTINUES. THIS ORDER IS A ONE TIME DOSE. D/C ONCE GIVEN   ipratropium-albuterol 0.5-2.5 (3) MG/3ML Soln Commonly known as:  DUONEB Take 3 mLs by nebulization. INHALE 1 VIAL VIA NEBULIZER 4 TIMES A DAY FOR COPD   JEVITY PO Place into feeding tube. DIABETA SOURCE AT 8O ML / HR VIA G/T CONTINUOUSLY FOR NUTRITION   LORazepam 0.5 MG tablet Commonly known as:  ATIVAN GIVE 1 TABLET BY MOUTH OR PEG EVERY 4 HOURS AS NEEDED FOR AGITATION   OXYGEN Inhale 2 L into the lungs See admin instructions. 2 liters of oxygen as needed for hypoxia and KEEP SAT AT >90%     Refresh Liquigel 1 % Gel Generic drug:  Carboxymethylcellulose Sodium Place 1 drop into both eyes 3 (three) times daily.   SEROquel 25 MG tablet Generic drug:  QUEtiapine Place 25 mg into feeding tube 2 (two) times daily.   tamsulosin 0.4 MG Caps capsule Commonly known as:  FLOMAX Take 1 capsule (0.4 mg total) by mouth daily.   Tussin 100 MG/5ML syrup Generic drug:  guaifenesin 200 mg See admin instructions. Give 200 mg per tube four times a day       Review of Systems  Immunization History  Administered Date(s) Administered   Influenza-Unspecified 06/21/2015, 03/01/2018   PPD Test 11/08/2015   Pneumococcal Conjugate-13 09/06/2017, 03/16/2018   Pneumococcal-Unspecified 06/21/2015   Pertinent  Health Maintenance Due  Topic Date Due   COLONOSCOPY  09/05/2018 (Originally 10/15/2000)   URINE MICROALBUMIN  09/21/2018 (Originally 10/23/2016)   OPHTHALMOLOGY EXAM  10/06/2018   FOOT EXAM  11/03/2018   INFLUENZA VACCINE  12/07/2018   HEMOGLOBIN A1C  01/27/2019   PNA vac Low Risk Adult (2 of 2 - PPSV23) 06/20/2020   Fall Risk  01/03/2018 03/09/2017 01/02/2017 11/15/2016 10/13/2016  Falls in the past year? Yes Exclusion - non ambulatory Yes No Yes  Comment - - - - -  Number falls in past yr: 1 - 2 or more - 2 or more  Comment - - - - -  Injury with Fall? Yes - No - No  Risk Factor Category  - - - - High Fall Risk  Risk for fall due to : - - - - -  Follow up - - - - -   Functional Status Survey:    Vitals:   08/22/18 1140  BP: (!) 154/76  Pulse: 62  Resp: 18  Temp: 98.7 F (37.1 C)  TempSrc: Oral  SpO2: 98%   There is no height or weight on file to calculate BMI. Physical Exam  Labs reviewed: Recent Labs    07/08/18 0555  07/09/18 0510  07/10/18 0540  07/30/18 0734 07/31/18 0246 08/01/18 0612  NA 150*   < > 141   < > 141   < > 154* 154* 147*  K 4.2   < > 4.0   < > 4.3   < > 4.2 4.3 5.6*  CL 120*   < > 114*   < > 111   < > 121* 126* 117*  CO2 23   <  > 23   < > 23   < > 21* 23 22  GLUCOSE 214*   < > 219*   < > 192*   < > 132* 121* 98  BUN 53*   < > 32*   < > 28*   < > 55* 50* 32*  CREATININE 1.30*   < > 1.23   < >  1.17   < > 1.55* 1.36* 1.18  CALCIUM 8.3*   < > 8.0*   < > 8.2*   < > 9.4 8.8* 8.6*  MG 2.5*  --  2.0  --  2.1  --   --   --   --    < > = values in this interval not displayed.   Recent Labs    01/23/18 05/22/18 07/27/18 1314  AST 29 29 43*  ALT 32 30 26  ALKPHOS 137* 132* 78  BILITOT  --   --  0.4  PROT  --   --  7.0  ALBUMIN  --   --  2.0*   Recent Labs    07/06/18 0250  07/08/18 0555  07/27/18 1314 07/28/18 0316 07/31/18 1023  WBC 16.9*   < > 10.9*   < > 11.4* 13.1* 13.1*  NEUTROABS 15.2*  --  6.0  --  10.1*  --   --   HGB 16.2   < > 14.6   < > 9.9* 10.7* 11.9*  HCT 52.6*   < > 46.0   < > 32.2* 33.7* 37.0*  MCV 91.6   < > 90.9   < > 91.2 91.1 90.9  PLT PLATELET CLUMPS NOTED ON SMEAR, COUNT APPEARS DECREASED   < > 90*   < > 134* 142* 276   < > = values in this interval not displayed.   Lab Results  Component Value Date   TSH 2.56 10/25/2017   Lab Results  Component Value Date   HGBA1C 6.8 (H) 07/27/2018   Lab Results  Component Value Date   CHOL 96 10/25/2017   HDL 29 (A) 10/25/2017   LDLCALC 50 10/25/2017   TRIG 86 10/25/2017   CHOLHDL 3.2 10/24/2015    Significant Diagnostic Results in last 30 days:  Ir Replc Gastro/colonic Tube Percut W/fluoro  Result Date: 08/04/2018 INDICATION: Dislodged gastrostomy tube requiring replacement. EXAM: GASTROSTOMY TUBE REPLACEMENT UNDER FLUOROSCOPY MEDICATIONS: None ANESTHESIA/SEDATION: None CONTRAST:  10 mL Omnipaque 300-administered into the gastric lumen. FLUOROSCOPY TIME:  Fluoroscopy Time: 6 seconds.  0.4 mGy. COMPLICATIONS: None immediate. PROCEDURE: The old gastrostomy tube exit site was probed initially with a balloon retention gastrostomy tube. A 5 French catheter was advanced through the tract and into the gastric lumen. A guidewire was placed and the  catheter removed. The tract was dilated to 34 [place]. An 55 French balloon retention gastrostomy tube was advanced over the wire. The retention balloon was inflated with 8 mL of saline. The tube was injected with contrast material under fluoroscopy and a fluoroscopic spot image saved to confirm tube position. FINDINGS: A new tube was able to be advanced through the old tract with the tip in the distal body of the stomach. IMPRESSION: Gastrostomy tube successfully replaced with a new 6 French balloon retention catheter placed into the body of the stomach. Electronically Signed   By: Aletta Edouard M.D.   On: 08/04/2018 12:17   Dg Abdomen Peg Tube Location  Result Date: 08/01/2018 CLINICAL DATA:  Check location of PEG tube. EXAM: ABDOMEN - 1 VIEW COMPARISON:  03/29/2018. FINDINGS: 30 mL of Omnipaque 300 was injected via PEG tube to check location. There is no extraluminal spread of contrast. There is good opacification of the stomach and proximal small bowel. Incidental inferior vena cava filter. IMPRESSION: Satisfactory PEG tube placement. Electronically Signed   By: Staci Righter M.D.   On: 08/01/2018 10:43   Dg Chest [facility]  Result Date: 07/27/2018 CLINICAL DATA:  Onset fever, cough and shortness of breath this morning. EXAM: PORTABLE CHEST 1 VIEW COMPARISON:  PA and lateral chest 07/07/2018 12/26/2015. FINDINGS: Lung volumes are low. Mild subsegmental atelectasis is seen in the bases. Heart size is enlarged. No pneumothorax or pleural effusion. IMPRESSION: Bibasilar subsegmental atelectasis. Cardiomegaly. Electronically Signed   By: Inge Rise M.D.   On: 07/27/2018 14:23    Assessment/Plan There are no diagnoses linked to this encounter.   Family/ staff Communication:   Labs/tests ordered:      This encounter was created in error - please disregard.

## 2018-08-23 ENCOUNTER — Non-Acute Institutional Stay (SKILLED_NURSING_FACILITY): Payer: Medicare Other | Admitting: Internal Medicine

## 2018-08-23 ENCOUNTER — Encounter: Payer: Self-pay | Admitting: Internal Medicine

## 2018-08-23 DIAGNOSIS — R1310 Dysphagia, unspecified: Secondary | ICD-10-CM

## 2018-08-23 DIAGNOSIS — I10 Essential (primary) hypertension: Secondary | ICD-10-CM

## 2018-08-23 DIAGNOSIS — I63311 Cerebral infarction due to thrombosis of right middle cerebral artery: Secondary | ICD-10-CM | POA: Diagnosis not present

## 2018-08-23 DIAGNOSIS — R627 Adult failure to thrive: Secondary | ICD-10-CM | POA: Diagnosis not present

## 2018-08-23 DIAGNOSIS — E1121 Type 2 diabetes mellitus with diabetic nephropathy: Secondary | ICD-10-CM

## 2018-08-23 NOTE — Progress Notes (Signed)
Location:    [place] Room Number: 094/B Place of Service:  SNF [phone]) Provider:  Granville Lewis PA-C  [name], MD  Patient Care Team: [name], MD as PCP - General (Internal Medicine) [place] as Referring Physician (General Practice) Letta Pate Luanna Salk, MD as Consulting Physician (Physical Medicine and Rehabilitation) Rosalin Hawking, MD as Consulting Physician (Neurology)  Extended Emergency Contact Information Primary Emergency Contact: Ivan Anchors [place] of [place] Phone: [phone] Relation: Brother  Code Status:  Full Code Goals of care: Advanced Directive information Advanced Directives 08/23/2018  Does Patient Have a Medical Advance Directive? Yes  Type of Advance Directive (No Data)  Does patient want to make changes to medical advance directive? No - Patient declined  Copy of [place] in Chart? No - copy requested  Would patient like information on creating a medical advance directive? No - Patient declined   Chief complaint-routine visit for medical management of chronic medical issues including failure to thrive- history of CVA with dysphasia status post PEG tube- depression- gout- type 2 diabetes-history of right-sided hemiparesis    HPI:  Pt is a 68 y.o. male seen today for medical management of chronic diseases.  As noted above.  He is now under hospice care secondary to failure to thrive with numerous medical issues including history of CVA dysphasia and recurrent aspiration pneumonia complicated with significant renal insufficiency.  Patient appears to have achieved a period of stability- there was thought he was showing some depressive symptoms recently and he has been started on low-dose Lexapro and this apparently is helping he is pleasant today he cannot really communicate much but when asked if he is hurting he gives a indication with his hand that he is  feeling all right.  He continues on low-dose Lexapro 5 mg a day he also has an order for Robafen 4 times a day for cough with history of aspiration as well as duo nebs 4 times a day.  He does have a history of some agitation with behaviors and continues on Seroquel 25 mg twice daily and has PRN Ativan 4 times a day if needed apparently this is been quite stable.  Regards to hypertension he continues on low-dose Norvasc recent blood pressures 139/68- 123/61-126/60.--154/76  He also is a type II diabetic blood sugars appear to be largely in the lower to mid 100s generally.  Currently he is resting in bed comfortably remains essentially a phasic but when asked if he is hurting he smiles and raises his fingers somewhat to indicate he is feeling okay.     Past Medical History:  Diagnosis Date   Diabetes mellitus without complication ([place])    Dysarthria    Dysphagia    GI bleed    Hyperlipidemia    Hypertension    Renal insufficiency 09/22/2016   Stroke [facility])    Past Surgical History:  Procedure Laterality Date   ESOPHAGOGASTRODUODENOSCOPY (EGD) WITH PROPOFOL N/A 11/03/2015   Procedure: ESOPHAGOGASTRODUODENOSCOPY (EGD) WITH PROPOFOL;  Surgeon: Manus Gunning, MD;  Location: [place];  Service: Gastroenterology;  Laterality: N/A;   IR REPLC GASTRO/COLONIC TUBE PERCUT W/FLUORO  12/29/2016   IR REPLC GASTRO/COLONIC TUBE PERCUT W/FLUORO  08/04/2018    Allergies  Allergen Reactions   Scopolamine     Urinary retention    Allergies as of 08/23/2018      Reactions   Scopolamine    Urinary retention  Medication List       Accurate as of August 23, 2018  9:39 AM. Always use your most recent med list.        acetaminophen 325 MG tablet Commonly known as:  TYLENOL Place 650 mg into feeding tube. VIA TUBE 4 TIMES A DAY FOR PAIN   allopurinol 150 mg Tabs tablet Commonly known as:  ZYLOPRIM Place 150 mg into feeding tube daily.   amLODipine 5 MG  tablet Commonly known as:  NORVASC TAKE 1 TABLET VIA TUBE ONCE DAILY   aspirin 325 MG tablet Place 325 mg into feeding tube daily.   atenolol 50 MG tablet Commonly known as:  TENORMIN Place 50 mg into feeding tube See admin instructions. 50 mg per tube every 12 hours and hold if Systolic reading is <914   atorvastatin 80 MG tablet Commonly known as:  LIPITOR Place 80 mg into feeding tube at bedtime.   divalproex 125 MG DR tablet Commonly known as:  DEPAKOTE 125 mg See admin instructions. Give 125 mg per tube at bedtime for mood   escitalopram 5 MG tablet Commonly known as:  LEXAPRO Take 5 mg by mouth daily. For Depression   free water Soln Place 300 mLs into feeding tube 4 (four) times daily.   haloperidol 5 MG tablet Commonly known as:  HALDOL GIVE 2.5MG  (0.5ML) IF AGITATION CONTINUES. THIS ORDER IS A ONE TIME DOSE. D/C ONCE GIVEN   ipratropium-albuterol 0.5-2.5 (3) MG/3ML Soln Commonly known as:  DUONEB Take 3 mLs by nebulization. INHALE 1 VIAL VIA NEBULIZER 4 TIMES A DAY FOR COPD   JEVITY PO Place into feeding tube. DIABETA SOURCE AT 8O ML / HR VIA G/T CONTINUOUSLY FOR NUTRITION   LORazepam 0.5 MG tablet Commonly known as:  ATIVAN GIVE 1 TABLET BY MOUTH OR PEG EVERY 4 HOURS AS NEEDED FOR AGITATION   OXYGEN Inhale 2 L into the lungs See admin instructions. 2 liters of oxygen as needed for hypoxia and KEEP SAT AT >90%   Refresh Liquigel 1 % Gel Generic drug:  Carboxymethylcellulose Sodium Place 1 drop into both eyes 3 (three) times daily.   SEROquel 25 MG tablet Generic drug:  QUEtiapine Place 25 mg into feeding tube 2 (two) times daily.   tamsulosin 0.4 MG Caps capsule Commonly known as:  FLOMAX Take 1 capsule (0.4 mg total) by mouth daily.   Tussin 100 MG/5ML syrup Generic drug:  guaifenesin 200 mg See admin instructions. Give 200 mg per tube four times a day       Review of Systems   Again very limited secondary to a phasic status please see  HPI  Immunization History  Administered Date(s) Administered   Influenza-Unspecified 06/21/2015, 03/01/2018   PPD Test 11/08/2015   Pneumococcal Conjugate-13 09/06/2017, 03/16/2018   Pneumococcal-Unspecified 06/21/2015   Pertinent  Health Maintenance Due  Topic Date Due   COLONOSCOPY  09/05/2018 (Originally 10/15/2000)   URINE MICROALBUMIN  09/21/2018 (Originally 10/23/2016)   OPHTHALMOLOGY EXAM  10/06/2018   FOOT EXAM  11/03/2018   INFLUENZA VACCINE  12/07/2018   HEMOGLOBIN A1C  01/27/2019   PNA vac Low Risk Adult (2 of 2 - PPSV23) 06/20/2020   Fall Risk  01/03/2018 03/09/2017 01/02/2017 11/15/2016 10/13/2016  Falls in the past year? Yes Exclusion - non ambulatory Yes No Yes  Comment - - - - -  Number falls in past yr: 1 - 2 or more - 2 or more  Comment - - - - -  Injury with Fall?  Yes - No - No  Risk Factor Category  - - - - High Fall Risk  Risk for fall due to : - - - - -  Follow up - - - - -   Functional Status Survey:    Vitals:   08/23/18 0933  BP: (!) 154/76  Pulse: 62  Resp: 18  Temp: 98.7 F (37.1 C)  TempSrc: Oral  SpO2: 98%   Recent blood pressures 146/74 126/72-127/66 and 154/76-126/75-126/71 Physical Exam   In general this is a pleasant cooperative elderly male who does not appear to be in any distress.  Skin is warm and dry he is not diaphoretic.  Eyes visual acuity appears grossly intact he does make eye contact.  Oropharynx he did not really open his mouth very wide from what I could see mucous membranes appeared to be moist he has somewhat poor dentition.  Chest somewhat poor respiratory effort he did attempt to deep breathe somewhat could not really appreciate overt congestion there is no labored breathing.  Heart is distant heart sounds slightly bradycardic in the mid 50s he does not have significant lower extremity edema.  Abdomen is soft nontender with positive bowel sounds PEG site appears unremarkable without any sign of drainage or  bleeding.  Musculoskeletal continues with left-sided hemiparesis is and has generalized lower extremity weakness is able to move his right upper extremity and move his fingers to some extent.  Neurologic as noted above he is alert he does make eye contact and attempts to follow verbal commands.  Psych as noted abov  Labs reviewed: Recent Labs    07/08/18 0555  07/09/18 0510  07/10/18 0540  07/30/18 0734 07/31/18 0246 08/01/18 0612  NA 150*   < > 141   < > 141   < > 154* 154* 147*  K 4.2   < > 4.0   < > 4.3   < > 4.2 4.3 5.6*  CL 120*   < > 114*   < > 111   < > 121* 126* 117*  CO2 23   < > 23   < > 23   < > 21* 23 22  GLUCOSE 214*   < > 219*   < > 192*   < > 132* 121* 98  BUN 53*   < > 32*   < > 28*   < > 55* 50* 32*  CREATININE 1.30*   < > 1.23   < > 1.17   < > 1.55* 1.36* 1.18  CALCIUM 8.3*   < > 8.0*   < > 8.2*   < > 9.4 8.8* 8.6*  MG 2.5*  --  2.0  --  2.1  --   --   --   --    < > = values in this interval not displayed.   Recent Labs    01/23/18 05/22/18 07/27/18 1314  AST 29 29 43*  ALT 32 30 26  ALKPHOS 137* 132* 78  BILITOT  --   --  0.4  PROT  --   --  7.0  ALBUMIN  --   --  2.0*   Recent Labs    07/06/18 0250  07/08/18 0555  07/27/18 1314 07/28/18 0316 07/31/18 1023  WBC 16.9*   < > 10.9*   < > 11.4* 13.1* 13.1*  NEUTROABS 15.2*  --  6.0  --  10.1*  --   --   HGB 16.2   < > 14.6   < >  9.9* 10.7* 11.9*  HCT 52.6*   < > 46.0   < > 32.2* 33.7* 37.0*  MCV 91.6   < > 90.9   < > 91.2 91.1 90.9  PLT PLATELET CLUMPS NOTED ON SMEAR, COUNT APPEARS DECREASED   < > 90*   < > 134* 142* 276   < > = values in this interval not displayed.   Lab Results  Component Value Date   TSH 2.56 10/25/2017   Lab Results  Component Value Date   HGBA1C 6.8 (H) 07/27/2018   Lab Results  Component Value Date   CHOL 96 10/25/2017   HDL 29 (A) 10/25/2017   LDLCALC 50 10/25/2017   TRIG 86 10/25/2017   CHOLHDL 3.2 10/24/2015    Significant Diagnostic Results in last 30  days:  Ir Replc Gastro/colonic Tube Percut W/fluoro  Result Date: 08/04/2018 INDICATION: Dislodged gastrostomy tube requiring replacement. EXAM: GASTROSTOMY TUBE REPLACEMENT UNDER FLUOROSCOPY MEDICATIONS: None ANESTHESIA/SEDATION: None CONTRAST:  10 mL Omnipaque 300-administered into the gastric lumen. FLUOROSCOPY TIME:  Fluoroscopy Time: 6 seconds.  0.4 mGy. COMPLICATIONS: None immediate. PROCEDURE: The old gastrostomy tube exit site was probed initially with a balloon retention gastrostomy tube. A 5 French catheter was advanced through the tract and into the gastric lumen. A guidewire was placed and the catheter removed. The tract was dilated to 61 [place]. An 56 French balloon retention gastrostomy tube was advanced over the wire. The retention balloon was inflated with 8 mL of saline. The tube was injected with contrast material under fluoroscopy and a fluoroscopic spot image saved to confirm tube position. FINDINGS: A new tube was able to be advanced through the old tract with the tip in the distal body of the stomach. IMPRESSION: Gastrostomy tube successfully replaced with a new 52 French balloon retention catheter placed into the body of the stomach. Electronically Signed   By: Aletta Edouard M.D.   On: 08/04/2018 12:17   Dg Abdomen Peg Tube Location  Result Date: 08/01/2018 CLINICAL DATA:  Check location of PEG tube. EXAM: ABDOMEN - 1 VIEW COMPARISON:  03/29/2018. FINDINGS: 30 mL of Omnipaque 300 was injected via PEG tube to check location. There is no extraluminal spread of contrast. There is good opacification of the stomach and proximal small bowel. Incidental inferior vena cava filter. IMPRESSION: Satisfactory PEG tube placement. Electronically Signed   By: Staci Righter M.D.   On: 08/01/2018 10:43   Dg Chest Port 1 View  Result Date: 07/27/2018 CLINICAL DATA:  Onset fever, cough and shortness of breath this morning. EXAM: PORTABLE CHEST 1 VIEW COMPARISON:  PA and lateral chest 07/07/2018  12/26/2015. FINDINGS: Lung volumes are low. Mild subsegmental atelectasis is seen in the bases. Heart size is enlarged. No pneumothorax or pleural effusion. IMPRESSION: Bibasilar subsegmental atelectasis. Cardiomegaly. Electronically Signed   By: Inge Rise M.D.   On: 07/27/2018 14:23    Assessment/Plan    #1 failure to thrive- patient continues under hospice care- he does remain under full CODE STATUS and I believe hospice is discussing this with his brother.  Clinically he appears relatively stable-he does continue on PEG tube nourishment and apparently is tolerating this well will order updated weights.  He does not appear to be in any pain or discomfort nursing staff has not noted any behaviors to my knowledge-he continues on Lexapro for depression.Marland Kitchen  He does have Tylenol for any pain issues  2.  History of CVA with right-sided hemiparesis at this point remains at baseline continue supportive  care he does not appear to be in pain I do note he is on a statin as well as aspirin.  3.-  History of hypertension this appears relatively well controlled it appears baseline systolics are generally in the 120-130 range he continues on atenolol as well as Norvasc  #4 history of aspiration pneumonia this appears stable he is on duo nebs routinely as well as an expectorant- he appears stable at this point.  5.  History of gout he is on allopurinol prophylactically this appears to be effective.  6.  Depression he has been started on low-dose Lexapro he appears to be in a decent state mood wise today but this will have to be monitored.  [id number]

## 2018-08-28 ENCOUNTER — Encounter: Payer: Self-pay | Admitting: Internal Medicine

## 2018-08-28 ENCOUNTER — Non-Acute Institutional Stay (SKILLED_NURSING_FACILITY): Payer: Medicare Other | Admitting: Internal Medicine

## 2018-08-28 DIAGNOSIS — E1121 Type 2 diabetes mellitus with diabetic nephropathy: Secondary | ICD-10-CM | POA: Diagnosis not present

## 2018-08-28 DIAGNOSIS — M79641 Pain in right hand: Secondary | ICD-10-CM | POA: Diagnosis not present

## 2018-08-28 DIAGNOSIS — M79642 Pain in left hand: Secondary | ICD-10-CM | POA: Diagnosis not present

## 2018-08-28 NOTE — Progress Notes (Signed)
Location:    [place] Room Number: 120/A Place of Service:  SNF [phone]) Provider:  Loraine Maple, MD  Patient Care Team: [name], MD as PCP - General (Internal Medicine) [place] as Referring Physician (General Practice) Letta Pate Luanna Salk, MD as Consulting Physician (Physical Medicine and Rehabilitation) Rosalin Hawking, MD as Consulting Physician (Neurology)  Extended Emergency Contact Information Primary Emergency Contact: Ivan Anchors [place] of [place] Phone: [phone] Relation: Brother  Code Status:  Full Code Goals of care: Advanced Directive information Advanced Directives 08/28/2018  Does Patient Have a Medical Advance Directive? Yes  Type of Advance Directive (No Data)  Does patient want to make changes to medical advance directive? No - Patient declined  Copy of [place] in Chart? No - copy requested  Would patient like information on creating a medical advance directive? No - Patient declined     Chief Complaint  Patient presents with   Acute Visit    Hand Pain    HPI:  Pt is a 68 y.o. male seen today for an acute visit for complaints of hand pain. Patient is a long-term resident of facility with a history of failure to thrive currently followed by hospice as well as a history of CVA with dysphasia has a PEG tube-as well as depression gout type 2 diabetes and history of right-sided hemiparesis.  His hospice nurses contacted me saying patient is complaining of hand pain apparently had been on Biofreeze before topical which gave him relief and she was wondering if this could be restarted.  Patient continues to have relative stability continues to be pleasant and spots of today apparently tolerating his PEG feedings well.  He has been started on low-dose Lexapro for concerns of depression.  He also continues on Seroquel for mood disorder.  Nursing staff  also was wondering if we could reduce his CBGs he is a type II diabetic currently not on any medication CBGs have been able to assess appear to be in the lower 100s-90s.  Is relatively a phasic but appears to indicate he is having no problems except he does nod his head yes when asked if he is having some hand pain more so in his left hand    Past Medical History:  Diagnosis Date   Diabetes mellitus without complication ([place])    Dysarthria    Dysphagia    GI bleed    Hyperlipidemia    Hypertension    Renal insufficiency 09/22/2016   Stroke [facility])    Past Surgical History:  Procedure Laterality Date   ESOPHAGOGASTRODUODENOSCOPY (EGD) WITH PROPOFOL N/A 11/03/2015   Procedure: ESOPHAGOGASTRODUODENOSCOPY (EGD) WITH PROPOFOL;  Surgeon: Manus Gunning, MD;  Location: [place];  Service: Gastroenterology;  Laterality: N/A;   IR REPLC GASTRO/COLONIC TUBE PERCUT W/FLUORO  12/29/2016   IR REPLC GASTRO/COLONIC TUBE PERCUT W/FLUORO  08/04/2018    Allergies  Allergen Reactions   Scopolamine     Urinary retention    Outpatient Encounter Medications as of 08/28/2018  Medication Sig   acetaminophen (TYLENOL) 325 MG tablet Place 650 mg into feeding tube. VIA TUBE 4 TIMES A DAY FOR PAIN   allopurinol (ZYLOPRIM) 150 mg TABS tablet Place 150 mg into feeding tube daily.   amLODipine (NORVASC) 5 MG tablet TAKE 1 TABLET VIA TUBE ONCE DAILY   aspirin 325 MG tablet Place 325 mg into feeding tube daily.   atenolol (TENORMIN)  50 MG tablet Place 50 mg into feeding tube See admin instructions. 50 mg per tube every 12 hours and hold if Systolic reading is <902   atorvastatin (LIPITOR) 80 MG tablet Place 80 mg into feeding tube at bedtime.    Carboxymethylcellulose Sodium (REFRESH LIQUIGEL) 1 % GEL Place 1 drop into both eyes 3 (three) times daily.   divalproex (DEPAKOTE) 125 MG DR tablet 125 mg See admin instructions. Give 125 mg per tube at bedtime for mood   escitalopram  (LEXAPRO) 5 MG tablet Take 5 mg by mouth daily. For Depression   famotidine (PEPCID) 20 MG tablet Take 20 mg by mouth 2 (two) times daily.   famotidine (PEPCID) 20 MG tablet Take 20 mg by mouth daily.   guaifenesin (TUSSIN) 100 MG/5ML syrup 200 mg See admin instructions. Give 200 mg per tube four times a day   ipratropium-albuterol (DUONEB) 0.5-2.5 (3) MG/3ML SOLN Take 3 mLs by nebulization. INHALE 1 VIAL VIA NEBULIZER 4 TIMES A DAY FOR COPD   LORazepam (ATIVAN) 0.5 MG tablet GIVE 1 TABLET BY MOUTH OR PEG EVERY 4 HOURS AS NEEDED FOR AGITATION   Nutritional Supplements (JEVITY PO) Place into feeding tube. DIABETA SOURCE AT 8O ML / HR VIA G/T CONTINUOUSLY FOR NUTRITION   OXYGEN Inhale 2 L into the lungs See admin instructions. 2 liters of oxygen as needed for hypoxia and KEEP SAT AT >90%   QUEtiapine (SEROQUEL) 25 MG tablet Place 25 mg into feeding tube 2 (two) times daily.    tamsulosin (FLOMAX) 0.4 MG CAPS capsule Take 1 capsule (0.4 mg total) by mouth daily.   Water For Irrigation, Sterile (FREE WATER) SOLN Place 300 mLs into feeding tube 4 (four) times daily.   [DISCONTINUED] haloperidol (HALDOL) 5 MG tablet GIVE 2.5MG  (0.5ML) IF AGITATION CONTINUES. THIS ORDER IS A ONE TIME DOSE. D/C ONCE GIVEN   No facility-administered encounter medications on file as of 08/28/2018.     Review of Systems   Again limited secondary to a phasic status please see HPI  Immunization History  Administered Date(s) Administered   Influenza-Unspecified 06/21/2015, 03/01/2018   PPD Test 11/08/2015   Pneumococcal Conjugate-13 09/06/2017, 03/16/2018   Pneumococcal-Unspecified 06/21/2015   Pertinent  Health Maintenance Due  Topic Date Due   COLONOSCOPY  09/05/2018 (Originally 10/15/2000)   URINE MICROALBUMIN  09/21/2018 (Originally 10/23/2016)   OPHTHALMOLOGY EXAM  10/06/2018   FOOT EXAM  11/03/2018   INFLUENZA VACCINE  12/07/2018   HEMOGLOBIN A1C  01/27/2019   PNA vac Low Risk Adult (2  of 2 - PPSV23) 06/20/2020   Fall Risk  01/03/2018 03/09/2017 01/02/2017 11/15/2016 10/13/2016  Falls in the past year? Yes Exclusion - non ambulatory Yes No Yes  Comment - - - - -  Number falls in past yr: 1 - 2 or more - 2 or more  Comment - - - - -  Injury with Fall? Yes - No - No  Risk Factor Category  - - - - High Fall Risk  Risk for fall due to : - - - - -  Follow up - - - - -   Functional Status Survey:     Temperature 98.3 pulse of 64 respirations 16 blood pressure 127/67 O2 saturation is 96% Physical Exam   In general this is a pleasant elderly male in no distress he appears to be lying comfortably in bed at baseline-he is responsive but a phasic.  His skin is warm and dry.  Eyes visual acuity appears to  be intact he makes eye contact.  Oropharynx appear to be relatively clear mucous membranes are slightly dry.  Chest has shallow air entry and somewhat poor respiratory effort but could not obtain appreciate any overt congestion or labored breathing.  Heart continues with distant heart sounds was regular rate and rhythm he does not have significant lower extremity edema.  Abdomen soft nontender he does have a PEG tube bowel sounds are positive.  Musculoskeletal continues with left-sided hemiparesis and generalized lower extremity weakness is able to move his right upper extremity and fingers to some extent he does have contractures of his left hand fingers.  He says there is some tenderness when I palpate his left hand.  Radial pulses are intact capillary refill intact.  Neurologic as noted above he is alert he makes eye contact and does make some attempts to follow verbal commands  Labs reviewed: Recent Labs    07/08/18 0555  07/09/18 0510  07/10/18 0540  07/30/18 0734 07/31/18 0246 08/01/18 0612  NA 150*   < > 141   < > 141   < > 154* 154* 147*  K 4.2   < > 4.0   < > 4.3   < > 4.2 4.3 5.6*  CL 120*   < > 114*   < > 111   < > 121* 126* 117*  CO2 23   < > 23   <  > 23   < > 21* 23 22  GLUCOSE 214*   < > 219*   < > 192*   < > 132* 121* 98  BUN 53*   < > 32*   < > 28*   < > 55* 50* 32*  CREATININE 1.30*   < > 1.23   < > 1.17   < > 1.55* 1.36* 1.18  CALCIUM 8.3*   < > 8.0*   < > 8.2*   < > 9.4 8.8* 8.6*  MG 2.5*  --  2.0  --  2.1  --   --   --   --    < > = values in this interval not displayed.   Recent Labs    01/23/18 05/22/18 07/27/18 1314  AST 29 29 43*  ALT 32 30 26  ALKPHOS 137* 132* 78  BILITOT  --   --  0.4  PROT  --   --  7.0  ALBUMIN  --   --  2.0*   Recent Labs    07/06/18 0250  07/08/18 0555  07/27/18 1314 07/28/18 0316 07/31/18 1023  WBC 16.9*   < > 10.9*   < > 11.4* 13.1* 13.1*  NEUTROABS 15.2*  --  6.0  --  10.1*  --   --   HGB 16.2   < > 14.6   < > 9.9* 10.7* 11.9*  HCT 52.6*   < > 46.0   < > 32.2* 33.7* 37.0*  MCV 91.6   < > 90.9   < > 91.2 91.1 90.9  PLT PLATELET CLUMPS NOTED ON SMEAR, COUNT APPEARS DECREASED   < > 90*   < > 134* 142* 276   < > = values in this interval not displayed.   Lab Results  Component Value Date   TSH 2.56 10/25/2017   Lab Results  Component Value Date   HGBA1C 6.8 (H) 07/27/2018   Lab Results  Component Value Date   CHOL 96 10/25/2017   HDL 29 (A) 10/25/2017   LDLCALC 50 10/25/2017  TRIG 86 10/25/2017   CHOLHDL 3.2 10/24/2015    Significant Diagnostic Results in last 30 days:  Ir Replc Gastro/colonic Tube Percut W/fluoro  Result Date: 08/04/2018 INDICATION: Dislodged gastrostomy tube requiring replacement. EXAM: GASTROSTOMY TUBE REPLACEMENT UNDER FLUOROSCOPY MEDICATIONS: None ANESTHESIA/SEDATION: None CONTRAST:  10 mL Omnipaque 300-administered into the gastric lumen. FLUOROSCOPY TIME:  Fluoroscopy Time: 6 seconds.  0.4 mGy. COMPLICATIONS: None immediate. PROCEDURE: The old gastrostomy tube exit site was probed initially with a balloon retention gastrostomy tube. A 5 French catheter was advanced through the tract and into the gastric lumen. A guidewire was placed and the catheter  removed. The tract was dilated to 31 [place]. An 47 French balloon retention gastrostomy tube was advanced over the wire. The retention balloon was inflated with 8 mL of saline. The tube was injected with contrast material under fluoroscopy and a fluoroscopic spot image saved to confirm tube position. FINDINGS: A new tube was able to be advanced through the old tract with the tip in the distal body of the stomach. IMPRESSION: Gastrostomy tube successfully replaced with a new 22 French balloon retention catheter placed into the body of the stomach. Electronically Signed   By: Aletta Edouard M.D.   On: 08/04/2018 12:17   Dg Abdomen Peg Tube Location  Result Date: 08/01/2018 CLINICAL DATA:  Check location of PEG tube. EXAM: ABDOMEN - 1 VIEW COMPARISON:  03/29/2018. FINDINGS: 30 mL of Omnipaque 300 was injected via PEG tube to check location. There is no extraluminal spread of contrast. There is good opacification of the stomach and proximal small bowel. Incidental inferior vena cava filter. IMPRESSION: Satisfactory PEG tube placement. Electronically Signed   By: Staci Righter M.D.   On: 08/01/2018 10:43    Assessment/Plan   #1- history of hand pain with history of left hand contracture will restart Biofreeze twice daily to both hands he also has Tylenol as needed for pain but apparently Biofreeze has been helping.  2.  History of type 2 diabetes- again he is not on any medications secondary to comfort status will reduce CBGs to daily.  [id number]

## 2018-09-06 DIAGNOSIS — N184 Chronic kidney disease, stage 4 (severe): Secondary | ICD-10-CM | POA: Diagnosis not present

## 2018-09-06 DIAGNOSIS — E43 Unspecified severe protein-calorie malnutrition: Secondary | ICD-10-CM | POA: Diagnosis not present

## 2018-09-06 DIAGNOSIS — I69354 Hemiplegia and hemiparesis following cerebral infarction affecting left non-dominant side: Secondary | ICD-10-CM | POA: Diagnosis not present

## 2018-09-06 DIAGNOSIS — N400 Enlarged prostate without lower urinary tract symptoms: Secondary | ICD-10-CM | POA: Diagnosis not present

## 2018-09-06 DIAGNOSIS — E1122 Type 2 diabetes mellitus with diabetic chronic kidney disease: Secondary | ICD-10-CM | POA: Diagnosis not present

## 2018-09-06 DIAGNOSIS — I129 Hypertensive chronic kidney disease with stage 1 through stage 4 chronic kidney disease, or unspecified chronic kidney disease: Secondary | ICD-10-CM | POA: Diagnosis not present

## 2018-09-06 DIAGNOSIS — F39 Unspecified mood [affective] disorder: Secondary | ICD-10-CM | POA: Diagnosis not present

## 2018-09-06 DIAGNOSIS — J9610 Chronic respiratory failure, unspecified whether with hypoxia or hypercapnia: Secondary | ICD-10-CM | POA: Diagnosis not present

## 2018-09-06 DIAGNOSIS — R1310 Dysphagia, unspecified: Secondary | ICD-10-CM | POA: Diagnosis not present

## 2018-09-06 DIAGNOSIS — Z960 Presence of urogenital implants: Secondary | ICD-10-CM | POA: Diagnosis not present

## 2018-09-06 DIAGNOSIS — Z8719 Personal history of other diseases of the digestive system: Secondary | ICD-10-CM | POA: Diagnosis not present

## 2018-09-09 ENCOUNTER — Encounter: Payer: Self-pay | Admitting: Internal Medicine

## 2018-09-09 ENCOUNTER — Non-Acute Institutional Stay (SKILLED_NURSING_FACILITY): Payer: Medicare Other | Admitting: Internal Medicine

## 2018-09-09 DIAGNOSIS — R339 Retention of urine, unspecified: Secondary | ICD-10-CM

## 2018-09-09 DIAGNOSIS — M79641 Pain in right hand: Secondary | ICD-10-CM

## 2018-09-09 DIAGNOSIS — M79642 Pain in left hand: Secondary | ICD-10-CM | POA: Diagnosis not present

## 2018-09-09 DIAGNOSIS — I10 Essential (primary) hypertension: Secondary | ICD-10-CM

## 2018-09-09 DIAGNOSIS — E1121 Type 2 diabetes mellitus with diabetic nephropathy: Secondary | ICD-10-CM | POA: Diagnosis not present

## 2018-09-09 NOTE — Progress Notes (Signed)
Location:   [facility] Room Number: 244-W Place of Service:  SNF [phone]) Provider:  Granville Lewis, PA-C  [name], MD  Patient Care Team: [name], MD as PCP - General (Internal Medicine) [place] as Referring Physician (General Practice) Letta Pate Luanna Salk, MD as Consulting Physician (Physical Medicine and Rehabilitation) Rosalin Hawking, MD as Consulting Physician (Neurology)  Extended Emergency Contact Information Primary Emergency Contact: Ivan An[place] Phone: [phone] Relation: Brother  Code Status:  Full Code Goals of care: Advanced Directive information Advanced Directives 08/28/2018  Does Patient Have a Medical Advance Directive? Yes  Type of Advance Directive (No Data)  Does patient want to make changes to medical advance directive? No - Patient declined  Copy of [place] in Chart? No - copy requested  Would patient like information on creating a medical advance directive? No - Patient declined     Chief complaint-acute visit secondary to questions about removing Foley catheter-also follow-up hand pain  HPI:  Pt is a 68 y.o. male seen today for an acute visit for possibility of removing Foley catheter-also follow-up of hand pain.  Patient is a long-term resident of facility has a history of failure to thrive currently followed by hospice with a history of CVA and dysphagia with a PEG tube as well as depression type 2 diabetes gout history of right-sided hemiparesis and urinary retention he does have a Foley catheter.  Patient does have some history of urinary retention with associated acute kidney injury.  He has had an indwelling Foley catheter and apparently has failed a previous trial.  Apparently there was consideration for urology follow-up but now that he is in hospice I suspect this would not be done- urology follow-up will be difficult as well with coronavirus concerns.   Did speak with his previous provider stated times there was consideration to discontinue the catheter and do a voiding trial at some point since he had exhibited some discomfort with the catheter in the past.  I did broach the subject with Ronav today- currently he says he is not having discomfort with it --  Although his opinion at times apparently varies.  I also spoke with his nurse who is quite familiar with him she would be hesitant to remove the catheter since she feels it is working well-and he does not exhibit persistent discomfort withit  He does continue on Flomax with a history of urinary obstruction at one point has been on Scopolamine patch but this was discontinued because it was thought it was aggravating his retention  In regards to hand pain he indicates the Biofreeze is helping  Patient is relatively a phasic but he is able to nod yes or no I asked if he is having discomfort he nodded his head no.  He also nodded no when I pointed to the suprapubic insertion site   Past Medical History:  Diagnosis Date  . Diabetes mellitus without complication ([facility])   . Dysarthria   . Dysphagia   . GI bleed   . Hyperlipidemia   . Hypertension   . Renal insufficiency 09/22/2016  . Stroke [facility])    Past Surgical History:  Procedure Laterality Date  . ESOPHAGOGASTRODUODENOSCOPY (EGD) WITH PROPOFOL N/A 11/03/2015   Procedure: ESOPHAGOGASTRODUODENOSCOPY (EGD) WITH PROPOFOL;  Surgeon: Manus Gunning, MD;  Location: [facility];  Service: Gastroenterology;  Laterality: N/A;  . IR REPLC GASTRO/COLONIC TUBE PERCUT W/FLUORO  12/29/2016  . IR REPLC GASTRO/COLONIC TUBE  PERCUT W/FLUORO  08/04/2018    Allergies  Allergen Reactions  . Scopolamine     Urinary retention    Outpatient Encounter Medications as of 09/09/2018  Medication Sig  . acetaminophen (TYLENOL) 325 MG tablet Place 650 mg into feeding tube. VIA TUBE 4 TIMES A DAY FOR PAIN  . allopurinol (ZYLOPRIM) 150 mg TABS  tablet Place 150 mg into feeding tube daily.  Marland Kitchen amLODipine (NORVASC) 5 MG tablet TAKE 1 TABLET VIA TUBE ONCE DAILY  . aspirin 325 MG tablet Place 325 mg into feeding tube daily.  Marland Kitchen atenolol (TENORMIN) 50 MG tablet Place 50 mg into feeding tube See admin instructions. 50 mg per tube every 12 hours and hold if Systolic reading is <761  . atorvastatin (LIPITOR) 80 MG tablet Place 80 mg into feeding tube at bedtime.   . Carboxymethylcellulose Sodium (REFRESH LIQUIGEL) 1 % GEL Place 1 drop into both eyes 3 (three) times daily.   . divalproex (DEPAKOTE) 125 MG DR tablet 125 mg See admin instructions. Give 125 mg per tube at bedtime for mood  . escitalopram (LEXAPRO) 5 MG tablet Take 5 mg by mouth daily. For Depression  . FAMOTIDINE PO Take 20 mg by mouth daily. Syrup  . guaifenesin (TUSSIN) 100 MG/5ML syrup 200 mg See admin instructions. Give 200 mg per tube four times a day  . ipratropium-albuterol (DUONEB) 0.5-2.5 (3) MG/3ML SOLN Take 3 mLs by nebulization. INHALE 1 VIAL VIA NEBULIZER 4 TIMES A DAY FOR COPD  . LORazepam (ATIVAN) 0.5 MG tablet GIVE 1 TABLET BY MOUTH OR PEG EVERY 4 HOURS AS NEEDED FOR AGITATION  . Menthol, Topical Analgesic, (BIOFREEZE) 4 % GEL Apply 1 application topically 2 (two) times daily as needed (Pain). Apply to bilateral hands   . Nutritional Supplements (JEVITY PO) Place into feeding tube. DIABETA SOURCE AT 8O ML / HR VIA G/T CONTINUOUSLY FOR NUTRITION  . OXYGEN Inhale 2 L into the lungs See admin instructions. 2 liters of oxygen as needed for hypoxia and KEEP SAT AT >90%  . QUEtiapine (SEROQUEL) 25 MG tablet Place 25 mg into feeding tube 2 (two) times daily.   . tamsulosin (FLOMAX) 0.4 MG CAPS capsule Take 1 capsule (0.4 mg total) by mouth daily.  . Water For Irrigation, Sterile (FREE WATER) SOLN Place 300 mLs into feeding tube 4 (four) times daily.  . [DISCONTINUED] famotidine (PEPCID) 20 MG tablet Take 20 mg by mouth 2 (two) times daily.  . [DISCONTINUED] famotidine  (PEPCID) 20 MG tablet Take 20 mg by mouth daily.    No facility-administered encounter medications on file as of 09/09/2018.     Review of Systems   This is limited secondary to his aphasic status but as noted above he is not really complaining of pain at this time Nursing has not noted any complaints of shortness of breath or abdominal discomfort  Immunization History  Administered Date(s) Administered  . Influenza-Unspecified 06/21/2015, 03/01/2018  . PPD Test 11/08/2015  . Pneumococcal Conjugate-13 09/06/2017, 03/16/2018  . Pneumococcal-Unspecified 06/21/2015   Pertinent  Health Maintenance Due  Topic Date Due  . COLONOSCOPY  10/15/2000  . URINE MICROALBUMIN  09/21/2018 (Originally 10/23/2016)  . OPHTHALMOLOGY EXAM  10/06/2018  . FOOT EXAM  11/03/2018  . INFLUENZA VACCINE  12/07/2018  . HEMOGLOBIN A1C  01/27/2019  . PNA vac Low Risk Adult (2 of 2 - PPSV23) 06/20/2020   Fall Risk  01/03/2018 03/09/2017 01/02/2017 11/15/2016 10/13/2016  Falls in the past year? Yes Exclusion - non ambulatory Yes  No Yes  Comment - - - - -  Number falls in past yr: 1 - 2 or more - 2 or more  Comment - - - - -  Injury with Fall? Yes - No - No  Risk Factor Category  - - - - High Fall Risk  Risk for fall due to : - - - - -  Follow up - - - - -   Functional Status Survey:    Vitals:   09/09/18 1322  BP: 121/71  Pulse: 70  Resp: 17  Temp: (!) 97.5 F (36.4 C)  TempSrc: Oral  SpO2: 96%  Weight: 144 lb 12.8 oz (65.7 kg)  Height: 5\' 9"  (1.753 m)   Body mass index is 21.38 kg/m. Physical Exam   In general this is a pleasant elderly male in no distress lying in bed he appears to be comfortable he is responsive but largely a phasic.  His skin is warm and dry.  Eyes visual acuity appears to be intact he makes eye contact.  Chest continue with shallow air entry but is clear to auscultation there is no labored breathing.  Heart is regular rate and rhythm without murmur gallop or rub heart  sounds are somewhat distant- he does not have significant lower extremity edema.  Abdomen is soft nontender positive bowel sounds PEG site appears unremarkable there is dressing around the site.  GU he does have an indwelling Foley catheter I do not note any drainage or increased edema at the insertion site there is no tenderness to palpation no suprapubic tenderness.  There is a moderate amount of   Amber colored urine in the bag  Musculoskeletal does have left-sided hemiparesis at baseline with contractures of his left hand fingers-is able to move his right upper extremity to some extent.  He does not exhibit pain with palpation of either hand.  Neurologic appears to be at baseline he makes eye contact and does follow simple verbal commands and does have left-sided hemiparesis at baseline with significant weakness of his right sided extremities.  Psych he is alert follows verbal commands is able to nod yes or no.  Is pleasant and cooperative  Labs reviewed: Recent Labs    07/08/18 0555  07/09/18 0510  07/10/18 0540  07/30/18 0734 07/31/18 0246 08/01/18 0612  NA 150*   < > 141   < > 141   < > 154* 154* 147*  K 4.2   < > 4.0   < > 4.3   < > 4.2 4.3 5.6*  CL 120*   < > 114*   < > 111   < > 121* 126* 117*  CO2 23   < > 23   < > 23   < > 21* 23 22  GLUCOSE 214*   < > 219*   < > 192*   < > 132* 121* 98  BUN 53*   < > 32*   < > 28*   < > 55* 50* 32*  CREATININE 1.30*   < > 1.23   < > 1.17   < > 1.55* 1.36* 1.18  CALCIUM 8.3*   < > 8.0*   < > 8.2*   < > 9.4 8.8* 8.6*  MG 2.5*  --  2.0  --  2.1  --   --   --   --    < > = values in this interval not displayed.   Recent Labs    01/23/18 05/22/18 07/27/18  1314  AST 29 29 43*  ALT 32 30 26  ALKPHOS 137* 132* 78  BILITOT  --   --  0.4  PROT  --   --  7.0  ALBUMIN  --   --  2.0*   Recent Labs    07/06/18 0250  07/08/18 0555  07/27/18 1314 07/28/18 0316 07/31/18 1023  WBC 16.9*   < > 10.9*   < > 11.4* 13.1* 13.1*  NEUTROABS  15.2*  --  6.0  --  10.1*  --   --   HGB 16.2   < > 14.6   < > 9.9* 10.7* 11.9*  HCT 52.6*   < > 46.0   < > 32.2* 33.7* 37.0*  MCV 91.6   < > 90.9   < > 91.2 91.1 90.9  PLT PLATELET CLUMPS NOTED ON SMEAR, COUNT APPEARS DECREASED   < > 90*   < > 134* 142* 276   < > = values in this interval not displayed.   Lab Results  Component Value Date   TSH 2.56 10/25/2017   Lab Results  Component Value Date   HGBA1C 6.8 (H) 07/27/2018   Lab Results  Component Value Date   CHOL 96 10/25/2017   HDL 29 (A) 10/25/2017   LDLCALC 50 10/25/2017   TRIG 86 10/25/2017   CHOLHDL 3.2 10/24/2015    Significant Diagnostic Results in last 30 days:  No results found.  Assessment/Plan  #1 history of chronic indwelling Foley catheter with history of urinary retention in the past- at this point would be hesitant to remove catheter he appears to be tolerating this well at the moment he denies pain when I talk with him and I also discussed this with nursing-he has failed previous voiding trialsbut would not be adverse certainly trying this at some point in the future.  Especially if there are issues with the catheter or he is having increased pain  I also discussed this with the nurse who is quite familiar with him and she feels he is tolerating this well for now Will discuss this with Dr. Linna Darner as well but at this point will leave the catheter in since he appears to be tolerating it okay at this point  #2 hand pain he appears to be benefiting from the [place] will continue this twice daily.  3 diabetes 2 he is not on any medications there was some thought of reducing his CBGs and we have done this daily blood sugars appear to be running from the 90s ~lower 100s the lowest I see is 77 on April 23 but generally he is in the 90s to roughly the mid 130s.  4.  Hypertension appears to be well controlled recent blood pressures 121/71-136/79 the lowest one I see is 106/61-he continues on atenolol as well as  Norvasc  5 depression he is on low-dose Lexapro he appears generally  decent spirits which was the case today at this point will monitor  [place], PA-C [phone]

## 2018-09-11 ENCOUNTER — Non-Acute Institutional Stay (SKILLED_NURSING_FACILITY): Payer: Medicare Other | Admitting: Internal Medicine

## 2018-09-11 ENCOUNTER — Encounter: Payer: Self-pay | Admitting: Internal Medicine

## 2018-09-11 DIAGNOSIS — D649 Anemia, unspecified: Secondary | ICD-10-CM | POA: Insufficient documentation

## 2018-09-11 DIAGNOSIS — N501 Vascular disorders of male genital organs: Secondary | ICD-10-CM | POA: Diagnosis not present

## 2018-09-11 DIAGNOSIS — D696 Thrombocytopenia, unspecified: Secondary | ICD-10-CM | POA: Diagnosis not present

## 2018-09-11 NOTE — Patient Instructions (Signed)
See assessment and plan under each diagnosis in the problem list and acutely for this visit 

## 2018-09-11 NOTE — Assessment & Plan Note (Signed)
Thrombocytopenia resolved as of 3/25.  No bleeding dyscrasias documented 09/11/2018.

## 2018-09-11 NOTE — Assessment & Plan Note (Signed)
Recheck CBC and differential once Corona quarantine lifted or emergently if active bleeding demonstrated.

## 2018-09-11 NOTE — Progress Notes (Signed)
  NURSING HOME LOCATION:  [facility] ROOM NUMBER:  120-A  CODE STATUS:  Full Code but on Hospice  PCP:  [name], MD  [address] [place] [address]   This is a nursing facility follow up for specific acute issue of bleeding from scrotum.  This was reported by phone to the on-call provider.  Chart was reviewed and low-dose aspirin held.  Interim medical record and care since last [place] visit was updated with review of diagnostic studies and change in clinical status since last visit were documented.  Sean Collins reported he had bleeding from his scrotum last night.  No other active bleeding dyscrasias have been reported, but he has a history of GI bleed in 2017.  He has a chronic PEG tube.  He is on no anticoagulants. On 07/28/2018 he exhibited a normochromic, normocytic anemia with hemoglobin 10.7 & hematocrit 33.7.  Also he had mild thrombocytopenia at that time with a platelet count of 142,000.  His values did improve; on 3/25 hemoglobin 11.7/hematocrit 37 with normal platelet count.  Review of systems: He is nonverbal and unable to provide any history.  His nurse denies any scrotal bleeding this morning or any other bleeding dyscrasias. No significant bruising, lymphadenopathy,or other abnormal bleeding reported.  Physical exam:  Pertinent or positive findings: He lies in bed with no voluntary movement.  He was asleep but could be awakened with minimal difficulty.  He remained somnolent throughout the exam.  Eyebrows are essentially absent.  Teeth are coated with plaque.  Heart sounds are essentially not auscultable.  Breath sounds are decreased, there appears to be some asymmetry with L breath sounds slightly louder than right.PEG Tube is present.  Pedal pulses are decreased.  Toenails are thickened and deformed.  He has a claw deformity of left hand.  There is left hemiparesis.  There is increased rigidity in the left upper extremity.  He is also weak in the  right upper and lower extremities as well. Foley catheter is present.  There appears to be some testicular atrophy, greater on the right than the left.  No scrotal lesions or bleeding was noted.  There is no intertriginous dermatitis present. Large polyp present R upper leg medially.  General appearance: Adequately nourished; no acute distress, increased work of breathing is present.   Lymphatic: No lymphadenopathy about the head, neck, axilla. Eyes: No conjunctival inflammation or lid edema is present. There is no scleral icterus. Ears:  External ear exam shows no significant lesions or deformities.   Nose:  External nasal examination shows no deformity or inflammation. Nasal mucosa are pink and moist without lesions, exudates Oral exam:  Lips and gums are healthy appearing. There is no oropharyngeal erythema or exudate. Neck:  No thyromegaly, masses, tenderness noted.    Lungs:  without wheezes, rhonchi, rales, rubs. Abdomen: Bowel sounds are normal. Abdomen is soft and nontender with no organomegaly, hernias, masses. GU: Deferred  Extremities:  No cyanosis, clubbing, edema  Skin: Warm & dry w/o tenting. No significant lesions or rash.  See summary under each active problem in the Problem List with associated updated therapeutic plan

## 2018-09-15 DIAGNOSIS — R1310 Dysphagia, unspecified: Secondary | ICD-10-CM | POA: Diagnosis not present

## 2018-09-15 DIAGNOSIS — E43 Unspecified severe protein-calorie malnutrition: Secondary | ICD-10-CM | POA: Diagnosis not present

## 2018-09-15 DIAGNOSIS — J9610 Chronic respiratory failure, unspecified whether with hypoxia or hypercapnia: Secondary | ICD-10-CM | POA: Diagnosis not present

## 2018-09-15 DIAGNOSIS — I129 Hypertensive chronic kidney disease with stage 1 through stage 4 chronic kidney disease, or unspecified chronic kidney disease: Secondary | ICD-10-CM | POA: Diagnosis not present

## 2018-09-15 DIAGNOSIS — I69354 Hemiplegia and hemiparesis following cerebral infarction affecting left non-dominant side: Secondary | ICD-10-CM | POA: Diagnosis not present

## 2018-09-15 DIAGNOSIS — E1122 Type 2 diabetes mellitus with diabetic chronic kidney disease: Secondary | ICD-10-CM | POA: Diagnosis not present

## 2018-09-16 ENCOUNTER — Non-Acute Institutional Stay (SKILLED_NURSING_FACILITY): Payer: Medicare Other | Admitting: Internal Medicine

## 2018-09-16 ENCOUNTER — Encounter: Payer: Self-pay | Admitting: Internal Medicine

## 2018-09-16 DIAGNOSIS — I10 Essential (primary) hypertension: Secondary | ICD-10-CM | POA: Diagnosis not present

## 2018-09-16 DIAGNOSIS — R627 Adult failure to thrive: Secondary | ICD-10-CM

## 2018-09-16 DIAGNOSIS — Z8673 Personal history of transient ischemic attack (TIA), and cerebral infarction without residual deficits: Secondary | ICD-10-CM | POA: Diagnosis not present

## 2018-09-16 DIAGNOSIS — R339 Retention of urine, unspecified: Secondary | ICD-10-CM | POA: Diagnosis not present

## 2018-09-16 DIAGNOSIS — E1149 Type 2 diabetes mellitus with other diabetic neurological complication: Secondary | ICD-10-CM

## 2018-09-16 DIAGNOSIS — R6251 Failure to thrive (child): Secondary | ICD-10-CM

## 2018-09-16 NOTE — Progress Notes (Signed)
Location:    [place] Room Number: 120/A Place of Service:  SNF [phone]) Provider: Granville Lewis PA-C  [name], MD  Patient Care Team: [name], MD as PCP - General (Internal Medicine) [place] as Referring Physician (General Practice) Letta Pate Luanna Salk, MD as Consulting Physician (Physical Medicine and Rehabilitation) Rosalin Hawking, MD as Consulting Physician (Neurology)  Extended Emergency Contact Information Primary Emergency Contact: Ivan Anchors [place] of [place] Phone: [phone] Relation: Brother  Code Status:  Full Code Goals of care: Advanced Directive information Advanced Directives 09/16/2018  Does Patient Have a Medical Advance Directive? Yes  Type of Advance Directive (No Data)  Does patient want to make changes to medical advance directive? No - Patient declined  Copy of [place] in Chart? No - copy requested  Would patient like information on creating a medical advance directive? No - Patient declined   Chief complaint routine visit for medical management of CVA with dysphasia status post PEG tube- diabetes- depression gout urinary retention and hypertension and failure to thrive    HPI:  Pt is a 68 y.o. male seen today for medical management of chronic diseases.  As noted above.  Patient continues under hospice services with a history of failure to thrive with various contributing factors including history of CVA dysphasia and recurrent aspiration pneumonia  Patient recently was noted to have some scrotal bleeding however this had resolved by the time Dr. Linna Darner saw him-this may have been more superficial bleeding there have been no recurrences and this is not apparent today on exam.  He also was recently started on Lexapro for concerns of depression he appears to be doing somewhat better in this regards he is always pleasant when I arrived in the room largely  nonverbal but does smile and responds easily to verbal commands.  He also recently was prescribed Biofreeze for some hand pain and this appears to be helping.  He does have some history of agitation with behaviors and is on Seroquel which appears to be helping.  He also has Ativan as needed  In regards to type 2 diabetes this is diet-controlled and blood sugars are largely in the 90s to lower 100s.  In regards to dysphasia status post CVA right-sided hemiparesis- this appears to be relatively baseline he does have an indwelling Foley catheter he is not really complaining of any discomfort with this today he is on Flomax.     Past Medical History:  Diagnosis Date   Diabetes mellitus without complication ([place])    Dysarthria    Dysphagia    GI bleed    Hyperlipidemia    Hypertension    Renal insufficiency 09/22/2016   Stroke [facility])    Past Surgical History:  Procedure Laterality Date   ESOPHAGOGASTRODUODENOSCOPY (EGD) WITH PROPOFOL N/A 11/03/2015   Procedure: ESOPHAGOGASTRODUODENOSCOPY (EGD) WITH PROPOFOL;  Surgeon: Manus Gunning, MD;  Location: [place];  Service: Gastroenterology;  Laterality: N/A;   IR REPLC GASTRO/COLONIC TUBE PERCUT W/FLUORO  12/29/2016   IR REPLC GASTRO/COLONIC TUBE PERCUT W/FLUORO  08/04/2018    Allergies  Allergen Reactions   Scopolamine     Urinary retention    Allergies as of 09/16/2018      Reactions   Scopolamine    Urinary retention      Medication List       Accurate as of Sep 16, 2018  3:35 PM. If you have any  questions, ask your nurse or doctor.        acetaminophen 325 MG tablet Commonly known as:  TYLENOL Place 650 mg into feeding tube. VIA TUBE 4 TIMES A DAY FOR PAIN   allopurinol 150 mg Tabs tablet Commonly known as:  ZYLOPRIM Place 150 mg into feeding tube daily.   amLODipine 5 MG tablet Commonly known as:  NORVASC TAKE 1 TABLET VIA TUBE ONCE DAILY   atenolol 50 MG tablet Commonly known as:   TENORMIN Place 50 mg into feeding tube See admin instructions. 50 mg per tube every 12 hours and hold if Systolic reading is <782   atorvastatin 80 MG tablet Commonly known as:  LIPITOR Place 80 mg into feeding tube at bedtime.   Biofreeze 4 % Gel Generic drug:  Menthol (Topical Analgesic) Apply 1 application topically 2 (two) times daily as needed (Pain). Apply to bilateral hands   divalproex 125 MG DR tablet Commonly known as:  DEPAKOTE 125 mg at bedtime.   escitalopram 5 MG tablet Commonly known as:  LEXAPRO Take 5 mg by mouth daily. For Depression   FAMOTIDINE PO Take 20 mg by mouth daily. Syrup   free water Soln Place 300 mLs into feeding tube 4 (four) times daily.   ipratropium-albuterol 0.5-2.5 (3) MG/3ML Soln Commonly known as:  DUONEB Take 3 mLs by nebulization. INHALE 1 VIAL VIA NEBULIZER 4 TIMES A DAY FOR COPD   JEVITY PO Place into feeding tube. DIABETA SOURCE AT 8O ML / HR VIA G/T CONTINUOUSLY FOR NUTRITION   LORazepam 0.5 MG tablet Commonly known as:  ATIVAN GIVE 1 TABLET BY MOUTH OR PEG EVERY 4 HOURS AS NEEDED FOR AGITATION   OXYGEN Inhale 2 L into the lungs See admin instructions. 2 liters of oxygen as needed for hypoxia and KEEP SAT AT >90%   Refresh Liquigel 1 % Gel Generic drug:  Carboxymethylcellulose Sodium Place 1 drop into both eyes 3 (three) times daily.   SEROquel 25 MG tablet Generic drug:  QUEtiapine Place 25 mg into feeding tube 2 (two) times daily.   tamsulosin 0.4 MG Caps capsule Commonly known as:  FLOMAX Take 1 capsule (0.4 mg total) by mouth daily.   Tussin 100 MG/5ML syrup Generic drug:  guaifenesin Place 200 mg into feeding tube 4 (four) times daily.   ZINC OXIDE (TOPICAL) 10 % Crea Apply topically. Barrier cream with zinc to penis/ scrotum bid       Review of Systems   This is largely unobtainable secondary to a phasic status but when asked he nods no when asked if he is having any discomfort  Immunization History   Administered Date(s) Administered   Influenza-Unspecified 06/21/2015, 03/01/2018   PPD Test 11/08/2015   Pneumococcal Conjugate-13 09/06/2017, 03/16/2018   Pneumococcal-Unspecified 06/21/2015   Pertinent  Health Maintenance Due  Topic Date Due   COLONOSCOPY  10/15/2000   URINE MICROALBUMIN  09/21/2018 (Originally 10/23/2016)   OPHTHALMOLOGY EXAM  10/06/2018   FOOT EXAM  11/03/2018   INFLUENZA VACCINE  12/07/2018   HEMOGLOBIN A1C  01/27/2019   PNA vac Low Risk Adult (2 of 2 - PPSV23) 06/20/2020   Fall Risk  01/03/2018 03/09/2017 01/02/2017 11/15/2016 10/13/2016  Falls in the past year? Yes Exclusion - non ambulatory Yes No Yes  Comment - - - - -  Number falls in past yr: 1 - 2 or more - 2 or more  Comment - - - - -  Injury with Fall? Yes - No - No  Risk Factor Category  - - - - High Fall Risk  Risk for fall due to : - - - - -  Follow up - - - - -   Functional Status Survey:    Vitals:   09/16/18 1514  BP: (!) 128/46  Pulse: 72  Resp: 16  Temp: 98.8 F (37.1 C)  TempSrc: Oral  SpO2: 96%  Weight: 141 lb 9.6 oz (64.2 kg)  Height: 5\' 9"  (1.753 m)   Body mass index is 20.91 kg/m. Physical Exam  In general this is a pleasant elderly male in no distress lying comfortably in bed he does make eye contact and responds yes or no.  His skin is warm and dry.  Eyes visual acuity appears grossly intact he does make eye contact.  Oropharynx he did not open his mouth very wide mucous membranes appeared to be fairly moist.  Chest is clear to auscultation with shallow air entry there is no labored breathing.  Heart is regular rate and rhythm without murmur gallop or rub he does not really have significant lower extremity edema.  Abdomen is soft nontender with PEG tube is in place.  GU could not appreciate any scrotal bleeding or edema of the scrotal area.  Musculoskeletal continues with left-sided hemiparesis is able to move his right upper and lower extremities to an  extent and is able to move his right hand and fingers  Neurologic as noted above he is a phasic.  He is alert  Psych he is pleasant and cooperative Labs reviewed: Recent Labs    07/08/18 0555  07/09/18 0510  07/10/18 0540  07/30/18 0734 07/31/18 0246 08/01/18 0612  NA 150*   < > 141   < > 141   < > 154* 154* 147*  K 4.2   < > 4.0   < > 4.3   < > 4.2 4.3 5.6*  CL 120*   < > 114*   < > 111   < > 121* 126* 117*  CO2 23   < > 23   < > 23   < > 21* 23 22  GLUCOSE 214*   < > 219*   < > 192*   < > 132* 121* 98  BUN 53*   < > 32*   < > 28*   < > 55* 50* 32*  CREATININE 1.30*   < > 1.23   < > 1.17   < > 1.55* 1.36* 1.18  CALCIUM 8.3*   < > 8.0*   < > 8.2*   < > 9.4 8.8* 8.6*  MG 2.5*  --  2.0  --  2.1  --   --   --   --    < > = values in this interval not displayed.   Recent Labs    01/23/18 05/22/18 07/27/18 1314  AST 29 29 43*  ALT 32 30 26  ALKPHOS 137* 132* 78  BILITOT  --   --  0.4  PROT  --   --  7.0  ALBUMIN  --   --  2.0*   Recent Labs    07/06/18 0250  07/08/18 0555  07/27/18 1314 07/28/18 0316 07/31/18 1023  WBC 16.9*   < > 10.9*   < > 11.4* 13.1* 13.1*  NEUTROABS 15.2*  --  6.0  --  10.1*  --   --   HGB 16.2   < > 14.6   < > 9.9* 10.7* 11.9*  HCT  52.6*   < > 46.0   < > 32.2* 33.7* 37.0*  MCV 91.6   < > 90.9   < > 91.2 91.1 90.9  PLT PLATELET CLUMPS NOTED ON SMEAR, COUNT APPEARS DECREASED   < > 90*   < > 134* 142* 276   < > = values in this interval not displayed.   Lab Results  Component Value Date   TSH 2.56 10/25/2017   Lab Results  Component Value Date   HGBA1C 6.8 (H) 07/27/2018   Lab Results  Component Value Date   CHOL 96 10/25/2017   HDL 29 (A) 10/25/2017   LDLCALC 50 10/25/2017   TRIG 86 10/25/2017   CHOLHDL 3.2 10/24/2015    Significant Diagnostic Results in last 30 days:  No results found.  Assessment/Plan  #1 history of failure to thrive he continues under comfort care he appears to be doing well with supportive care he does not  appear to be in pain or discomfort which is encouraging-Biofreeze appears to be helping with his hand pain He also has orders for Tylenol.  2.-  History of CVA with dysphasia and PEG tube-again he appears to be tolerating his tube feedings well- aspirin has been discontinued because of the scrotal bleeding--at some point consider restarting this if there is no reoccurrence Continues with left-sided hemiparesis.  3.  History of type 2 diabetes- currently on no medication CBGs appear to be stable in the 90s to low 100s.  4.  History of gout continues on allopurinol this is been stable with no recent flares.  5.  History of urinary retention continues with an indwelling Foley catheter he appears to be tolerating this okay I do not really note significant complaints of pain- at one point it was thought of discontinuing this but he has been stable with this and appears to be at this point well-tolerated- he continues on Flomax as well  #6- history of hypertension this appears to be stable recent blood pressures 128/46-137/75-115/71- he continues on atenolol as well as Norvasc.  7.  History of agitation and anxiety this appears to be controlled with the Seroquel as well as Depakote which he receives at night he also has an order for PRN Ativan.  8.  History of aspiration pneumonia continues on nebulizers.  As well as Tussin  and oxygen as needed  9.  History of depression he has been started on low-dose Lexapro this appears to be stable and appears to be in somewhat better spirits when I see him.  10.  History of thrombocytopenia this is shown improvement on lab done in late March at 276,000.  Again he does continue under hospice services but appears to be reaching a period of stability as noted above   CPT- [phone]

## 2018-09-17 ENCOUNTER — Encounter: Payer: Self-pay | Admitting: Internal Medicine

## 2018-09-18 DIAGNOSIS — J9610 Chronic respiratory failure, unspecified whether with hypoxia or hypercapnia: Secondary | ICD-10-CM | POA: Diagnosis not present

## 2018-09-18 DIAGNOSIS — R1310 Dysphagia, unspecified: Secondary | ICD-10-CM | POA: Diagnosis not present

## 2018-09-18 DIAGNOSIS — I129 Hypertensive chronic kidney disease with stage 1 through stage 4 chronic kidney disease, or unspecified chronic kidney disease: Secondary | ICD-10-CM | POA: Diagnosis not present

## 2018-09-18 DIAGNOSIS — I69354 Hemiplegia and hemiparesis following cerebral infarction affecting left non-dominant side: Secondary | ICD-10-CM | POA: Diagnosis not present

## 2018-09-18 DIAGNOSIS — E1122 Type 2 diabetes mellitus with diabetic chronic kidney disease: Secondary | ICD-10-CM | POA: Diagnosis not present

## 2018-09-18 DIAGNOSIS — E43 Unspecified severe protein-calorie malnutrition: Secondary | ICD-10-CM | POA: Diagnosis not present

## 2018-09-25 ENCOUNTER — Encounter: Payer: Self-pay | Admitting: Internal Medicine

## 2018-09-25 ENCOUNTER — Non-Acute Institutional Stay (SKILLED_NURSING_FACILITY): Payer: Medicare Other | Admitting: Internal Medicine

## 2018-09-25 DIAGNOSIS — I10 Essential (primary) hypertension: Secondary | ICD-10-CM

## 2018-09-25 DIAGNOSIS — I69321 Dysphasia following cerebral infarction: Secondary | ICD-10-CM | POA: Diagnosis not present

## 2018-09-25 DIAGNOSIS — Z960 Presence of urogenital implants: Secondary | ICD-10-CM | POA: Diagnosis not present

## 2018-09-25 DIAGNOSIS — Z978 Presence of other specified devices: Secondary | ICD-10-CM

## 2018-09-25 NOTE — Progress Notes (Signed)
Location:    [place] Room Number: 120/A Place of Service:  SNF [phone]) Provider:  Loraine Maple, MD  Patient Care Team: [name], MD as PCP - General (Internal Medicine) [place] as Referring Physician (General Practice) Letta Pate Luanna Salk, MD as Consulting Physician (Physical Medicine and Rehabilitation) Rosalin Hawking, MD as Consulting Physician (Neurology)  Extended Emergency Contact Information Primary Emergency Contact: Ivan Anchors [place] Phone: [phone] Relation: Brother  Code Status:  Full Code Goals of care: Advanced Directive information Advanced Directives 09/25/2018  Does Patient Have a Medical Advance Directive? Yes  Type of Advance Directive (No Data)  Does patient want to make changes to medical advance directive? No - Patient declined  Copy of [place] in Chart? No - copy requested  Would patient like information on creating a medical advance directive? No - Patient declined     Chief Complaint  Patient presents with  . Acute Visit    Possible Speech Evaluation    HPI:  Pt is a 69 y.o. male seen today for an acute visit for possible speech reevaluation. He is a long-term resident of facility with a history of CVA with dysphasia with a PEG tube he also has diabetes type 2 depression gout urinary retention hypertension and failure to thrive.  He continues under hospice services.  Hospice nurse did contact me however and stated he appears to be improving somewhat and has actually has expressed a desire to start having some possible p.o. intake.  Patient does have a aphasia but is able to communicate at times.  He does appear to be getting somewhat stronger.  When I saw him this evening I did ask him about wanting to eat and he appeared to nod iin approval and did smile      Past Medical History:  Diagnosis Date  . Diabetes  mellitus without complication ([place])   . Dysarthria   . Dysphagia   . GI bleed   . Hyperlipidemia   . Hypertension   . Renal insufficiency 09/22/2016  . Stroke [facility])    Past Surgical History:  Procedure Laterality Date  . ESOPHAGOGASTRODUODENOSCOPY (EGD) WITH PROPOFOL N/A 11/03/2015   Procedure: ESOPHAGOGASTRODUODENOSCOPY (EGD) WITH PROPOFOL;  Surgeon: Manus Gunning, MD;  Location: [place];  Service: Gastroenterology;  Laterality: N/A;  . IR REPLC GASTRO/COLONIC TUBE PERCUT W/FLUORO  12/29/2016  . IR REPLC GASTRO/COLONIC TUBE PERCUT W/FLUORO  08/04/2018    Allergies  Allergen Reactions  . Scopolamine     Urinary retention    Outpatient Encounter Medications as of 09/25/2018  Medication Sig  . acetaminophen (TYLENOL) 325 MG tablet Place 650 mg into feeding tube. VIA TUBE 4 TIMES A DAY FOR PAIN  . allopurinol (ZYLOPRIM) 150 mg TABS tablet Place 150 mg into feeding tube daily.  Marland Kitchen amLODipine (NORVASC) 5 MG tablet TAKE 1 TABLET VIA TUBE ONCE DAILY  . atenolol (TENORMIN) 50 MG tablet Place 50 mg into feeding tube See admin instructions. 50 mg per tube every 12 hours and hold if Systolic reading is <419  . atorvastatin (LIPITOR) 80 MG tablet Place 80 mg into feeding tube at bedtime.   . Carboxymethylcellulose Sodium (REFRESH LIQUIGEL) 1 % GEL Place 1 drop into both eyes 3 (three) times daily.   . divalproex (DEPAKOTE) 125 MG DR tablet 125 mg at bedtime.   Marland Kitchen escitalopram (LEXAPRO) 5 MG tablet Take 5 mg by  mouth daily. For Depression  . FAMOTIDINE PO Take 20 mg by mouth daily. Syrup  . guaiFENesin (ROBAFEN MUCUS/CHEST CONGESTION) 100 MG/5ML liquid TAKE 10 ML VIA TUBE 4 TIMES A DAY FOR COUGH?CONGESTION  . ipratropium-albuterol (DUONEB) 0.5-2.5 (3) MG/3ML SOLN Take 3 mLs by nebulization. INHALE 1 VIAL VIA NEBULIZER 4 TIMES A DAY FOR COPD  . LORazepam (ATIVAN) 0.5 MG tablet GIVE 1 TABLET BY MOUTH OR PEG EVERY 4 HOURS AS NEEDED FOR AGITATION  . Menthol, Topical Analgesic, (BIOFREEZE)  4 % GEL Apply 1 application topically 2 (two) times daily as needed (Pain). Apply to bilateral hands   . Nutritional Supplements (JEVITY PO) Place into feeding tube. DIABETA SOURCE AT 8O ML / HR VIA G/T CONTINUOUSLY FOR NUTRITION  . OXYGEN Inhale 2 L into the lungs See admin instructions. 2 liters of oxygen as needed for hypoxia and KEEP SAT AT >90%  . QUEtiapine (SEROQUEL) 25 MG tablet Place 25 mg into feeding tube 2 (two) times daily.   . tamsulosin (FLOMAX) 0.4 MG CAPS capsule Take 1 capsule (0.4 mg total) by mouth daily.  . Water For Irrigation, Sterile (FREE WATER) SOLN Place 300 mLs into feeding tube 4 (four) times daily.  . [DISCONTINUED] guaifenesin (TUSSIN) 100 MG/5ML syrup Place 200 mg into feeding tube 4 (four) times daily.   . [DISCONTINUED] ZINC OXIDE, TOPICAL, 10 % CREA Apply topically. Barrier cream with zinc to penis/ scrotum bid   No facility-administered encounter medications on file as of 09/25/2018.     Review of Systems   Is quite limited secondary to patient's aphasic status but as noted above he appeared to brighten up when I mentioned possibility of trying to have some food intake by mouth.  He does not complain of pain at this time or shortness of breath he appears to be in good spirits  Nursing is not reported any recent issues  Immunization History  Administered Date(s) Administered  . Influenza-Unspecified 06/21/2015, 03/01/2018  . PPD Test 11/08/2015  . Pneumococcal Conjugate-13 09/06/2017, 03/16/2018  . Pneumococcal-Unspecified 06/21/2015   Pertinent  Health Maintenance Due  Topic Date Due  . COLONOSCOPY  10/26/2018 (Originally 10/15/2000)  . OPHTHALMOLOGY EXAM  10/06/2018  . FOOT EXAM  11/03/2018  . INFLUENZA VACCINE  12/07/2018  . HEMOGLOBIN A1C  01/27/2019  . URINE MICROALBUMIN  07/27/2019  . PNA vac Low Risk Adult (2 of 2 - PPSV23) 06/20/2020   Fall Risk  01/03/2018 03/09/2017 01/02/2017 11/15/2016 10/13/2016  Falls in the past year? Yes Exclusion - non  ambulatory Yes No Yes  Comment - - - - -  Number falls in past yr: 1 - 2 or more - 2 or more  Comment - - - - -  Injury with Fall? Yes - No - No  Risk Factor Category  - - - - High Fall Risk  Risk for fall due to : - - - - -  Follow up - - - - -   Functional Status Survey:    Vitals:   09/25/18 1626  BP: 124/72  Pulse: (!) 56  Resp: 16  Temp: 98 F (36.7 C)  TempSrc: Oral  SpO2: 98%  Weight: 141 lb 9.6 oz (64.2 kg)  Height: 5\' 9"  (1.753 m)  Update blood pressure is 129/60 pulse is in the 60s Body mass index is 20.91 kg/m. Physical Exam   In general this is a pleasant elderly male in no distress lying comfortably in bed he does continue to make eye contact and  does respond at times yes or no and does nod when asked questions.  His skin is warm and dry.  Eyes visual acuity appears grossly intact he continues to make eye contact.  Oropharynx is clear mucous membranes actually appear fairly moist.  Chest is clear to auscultation with shallow air entry no sign of labored breathing.  Heart is somewhat distant heart sounds regular rate and rhythm without murmur gallop or rub he does not have significant edema.  Abdomen soft nontender with positive bowel sounds PEG site is in place upper abdomen I do not see any drainage bleeding or concerning erythema.  GU he does have an indwelling Foley catheter draining amber-colored urine   Musculoskeletal does have left-sided hemiparesis at baseline does move his right upper and lower extremities he does have some movement of his right hand.    Neurologic as noted above he continues to be largely aphasic he is alert pleasant smiling.  Psych continues to be pleasant and cooperative appears to understand what I am asking [place] when I asked if he had been working in [place] r he smiled and nodded yes  Labs reviewed: Recent Labs    07/08/18 0555  07/09/18 0510  07/10/18 0540  07/30/18 0734 07/31/18 0246 08/01/18 0612  NA 150*    < > 141   < > 141   < > 154* 154* 147*  K 4.2   < > 4.0   < > 4.3   < > 4.2 4.3 5.6*  CL 120*   < > 114*   < > 111   < > 121* 126* 117*  CO2 23   < > 23   < > 23   < > 21* 23 22  GLUCOSE 214*   < > 219*   < > 192*   < > 132* 121* 98  BUN 53*   < > 32*   < > 28*   < > 55* 50* 32*  CREATININE 1.30*   < > 1.23   < > 1.17   < > 1.55* 1.36* 1.18  CALCIUM 8.3*   < > 8.0*   < > 8.2*   < > 9.4 8.8* 8.6*  MG 2.5*  --  2.0  --  2.1  --   --   --   --    < > = values in this interval not displayed.   Recent Labs    01/23/18 05/22/18 07/27/18 1314  AST 29 29 43*  ALT 32 30 26  ALKPHOS 137* 132* 78  BILITOT  --   --  0.4  PROT  --   --  7.0  ALBUMIN  --   --  2.0*   Recent Labs    07/06/18 0250  07/08/18 0555  07/27/18 1314 07/28/18 0316 07/31/18 1023  WBC 16.9*   < > 10.9*   < > 11.4* 13.1* 13.1*  NEUTROABS 15.2*  --  6.0  --  10.1*  --   --   HGB 16.2   < > 14.6   < > 9.9* 10.7* 11.9*  HCT 52.6*   < > 46.0   < > 32.2* 33.7* 37.0*  MCV 91.6   < > 90.9   < > 91.2 91.1 90.9  PLT PLATELET CLUMPS NOTED ON SMEAR, COUNT APPEARS DECREASED   < > 90*   < > 134* 142* 276   < > = values in this interval not displayed.   Lab Results  Component Value  Date   TSH 2.56 10/25/2017   Lab Results  Component Value Date   HGBA1C 6.8 (H) 07/27/2018   Lab Results  Component Value Date   CHOL 96 10/25/2017   HDL 29 (A) 10/25/2017   LDLCALC 50 10/25/2017   TRIG 86 10/25/2017   CHOLHDL 3.2 10/24/2015    Significant Diagnostic Results in last 30 days:  No results found.  Assessment/Plan  #1 history of dysphagia status post PEG tube with history of CVA.  Per discussion with hospice nurse will write an order for speech therapy consult for possible trial of p.o. intake.  I did discuss this with therapy as well as with the hospice nurse later in the day- will write the order with the stipulation it needs hospice approval per discussion with wound care nurse.  Clinically he appears to be doing  well.  .  2 hypertension- this continues to be stable on atenolol and Norvasc recent systolics have been in the 120s.  3.  History of Rosanne Gutting retention-he has an indwelling Foley catheter which is draining amber-colored urine- at one point there was consideration of discontinuing this because he was having discomfort but he is not really been complaining of discomfort when I see him although this will have to be monitored at this point appears to be well-tolerated-apparently there have been  issues in the past with discontinuing this  [id number]- of no greater than 25-minute spent assessing patient discussing his status with therapy as well as with hospice-and coordinating a plan of care- of note greater than 50% of time spent coordinating plan of care with input as noted above

## 2018-09-26 ENCOUNTER — Encounter: Payer: Self-pay | Admitting: Internal Medicine

## 2018-10-02 DIAGNOSIS — J9610 Chronic respiratory failure, unspecified whether with hypoxia or hypercapnia: Secondary | ICD-10-CM | POA: Diagnosis not present

## 2018-10-02 DIAGNOSIS — I69354 Hemiplegia and hemiparesis following cerebral infarction affecting left non-dominant side: Secondary | ICD-10-CM | POA: Diagnosis not present

## 2018-10-02 DIAGNOSIS — R1310 Dysphagia, unspecified: Secondary | ICD-10-CM | POA: Diagnosis not present

## 2018-10-02 DIAGNOSIS — E1122 Type 2 diabetes mellitus with diabetic chronic kidney disease: Secondary | ICD-10-CM | POA: Diagnosis not present

## 2018-10-02 DIAGNOSIS — E43 Unspecified severe protein-calorie malnutrition: Secondary | ICD-10-CM | POA: Diagnosis not present

## 2018-10-02 DIAGNOSIS — I129 Hypertensive chronic kidney disease with stage 1 through stage 4 chronic kidney disease, or unspecified chronic kidney disease: Secondary | ICD-10-CM | POA: Diagnosis not present

## 2018-10-07 ENCOUNTER — Non-Acute Institutional Stay (SKILLED_NURSING_FACILITY): Payer: Medicare Other | Admitting: Internal Medicine

## 2018-10-07 ENCOUNTER — Encounter: Payer: Self-pay | Admitting: Internal Medicine

## 2018-10-07 DIAGNOSIS — R52 Pain, unspecified: Secondary | ICD-10-CM | POA: Diagnosis not present

## 2018-10-07 DIAGNOSIS — E43 Unspecified severe protein-calorie malnutrition: Secondary | ICD-10-CM | POA: Diagnosis not present

## 2018-10-07 DIAGNOSIS — W19XXXD Unspecified fall, subsequent encounter: Secondary | ICD-10-CM

## 2018-10-07 DIAGNOSIS — Z8719 Personal history of other diseases of the digestive system: Secondary | ICD-10-CM | POA: Diagnosis not present

## 2018-10-07 DIAGNOSIS — I69354 Hemiplegia and hemiparesis following cerebral infarction affecting left non-dominant side: Secondary | ICD-10-CM | POA: Diagnosis not present

## 2018-10-07 DIAGNOSIS — R1310 Dysphagia, unspecified: Secondary | ICD-10-CM | POA: Diagnosis not present

## 2018-10-07 DIAGNOSIS — N400 Enlarged prostate without lower urinary tract symptoms: Secondary | ICD-10-CM | POA: Diagnosis not present

## 2018-10-07 DIAGNOSIS — F39 Unspecified mood [affective] disorder: Secondary | ICD-10-CM | POA: Diagnosis not present

## 2018-10-07 DIAGNOSIS — Z87898 Personal history of other specified conditions: Secondary | ICD-10-CM

## 2018-10-07 DIAGNOSIS — J9610 Chronic respiratory failure, unspecified whether with hypoxia or hypercapnia: Secondary | ICD-10-CM | POA: Diagnosis not present

## 2018-10-07 DIAGNOSIS — N184 Chronic kidney disease, stage 4 (severe): Secondary | ICD-10-CM | POA: Diagnosis not present

## 2018-10-07 DIAGNOSIS — Z960 Presence of urogenital implants: Secondary | ICD-10-CM | POA: Diagnosis not present

## 2018-10-07 DIAGNOSIS — I129 Hypertensive chronic kidney disease with stage 1 through stage 4 chronic kidney disease, or unspecified chronic kidney disease: Secondary | ICD-10-CM | POA: Diagnosis not present

## 2018-10-07 DIAGNOSIS — E1122 Type 2 diabetes mellitus with diabetic chronic kidney disease: Secondary | ICD-10-CM | POA: Diagnosis not present

## 2018-10-07 MED ORDER — TRAMADOL HCL 50 MG PO TABS: ORAL_TABLET | ORAL | 0 refills | Status: DC

## 2018-10-07 NOTE — Progress Notes (Signed)
Location:    [facility] Room Number: 120/A Place of Service:  SNF [phone]) Provider:  Loraine Maple, MD  Patient Care Team: [name], MD as PCP - General (Internal Medicine) [place] as Referring Physician (General Practice) Letta Pate Luanna Salk, MD as Consulting Physician (Physical Medicine and Rehabilitation) Rosalin Hawking, MD as Consulting Physician (Neurology)  Extended Emergency Contact Information Primary Emergency Contact: Ivan Anchors [place] of [place] Phone: [phone] Relation: Brother  Code Status:  Full Code Goals of care: Advanced Directive information Advanced Directives 10/07/2018  Does Patient Have a Medical Advance Directive? Yes  Type of Advance Directive (No Data)  Does patient want to make changes to medical advance directive? No - Patient declined  Copy of [place] in Chart? No - copy requested  Would patient like information on creating a medical advance directive? No - Patient declined     Chief Complaint  Patient presents with  . Acute Visit    Pain Management    HPI:  Pt is a 68 y.o. male seen today for an acute visit for pain management.  Patient is a long-term resident of facility currently under comfort care status-hospice services.  He has a history of a CVA with dysphasia status post PEG tube as well as type 2 diabetes depression gout urinary retention hypertension and failure to thrive.  He appears to actually have made some improvement.  However pain apparently has become an issue.  He does have orders for Tylenol as well as topical Biofreeze for his hands.  However there is some concern that this is not totally effective and he needs something a bit stronger.  Patient is largely nonverbal and a phasic.   Apparently over the weekend he did have a fall and was found on the floor apparently with no apparent injury   Past Medical  History:  Diagnosis Date  . Diabetes mellitus without complication ([place])   . Dysarthria   . Dysphagia   . GI bleed   . Hyperlipidemia   . Hypertension   . Renal insufficiency 09/22/2016  . Stroke [facility])    Past Surgical History:  Procedure Laterality Date  . ESOPHAGOGASTRODUODENOSCOPY (EGD) WITH PROPOFOL N/A 11/03/2015   Procedure: ESOPHAGOGASTRODUODENOSCOPY (EGD) WITH PROPOFOL;  Surgeon: Manus Gunning, MD;  Location: [facility];  Service: Gastroenterology;  Laterality: N/A;  . IR REPLC GASTRO/COLONIC TUBE PERCUT W/FLUORO  12/29/2016  . IR REPLC GASTRO/COLONIC TUBE PERCUT W/FLUORO  08/04/2018    Allergies  Allergen Reactions  . Scopolamine     Urinary retention    Outpatient Encounter Medications as of 10/07/2018  Medication Sig  . acetaminophen (TYLENOL) 325 MG tablet Place 650 mg into feeding tube. VIA TUBE 4 TIMES A DAY FOR PAIN  . allopurinol (ZYLOPRIM) 150 mg TABS tablet Place 150 mg into feeding tube daily.  Marland Kitchen amLODipine (NORVASC) 5 MG tablet TAKE 1 TABLET VIA TUBE ONCE DAILY  . atenolol (TENORMIN) 50 MG tablet Place 50 mg into feeding tube See admin instructions. 50 mg per tube every 12 hours and hold if Systolic reading is <469  . atorvastatin (LIPITOR) 80 MG tablet Place 80 mg into feeding tube at bedtime.   . Carboxymethylcellulose Sodium (REFRESH LIQUIGEL) 1 % GEL Place 1 drop into both eyes 3 (three) times daily.   . divalproex (DEPAKOTE) 125 MG DR tablet 125 mg at bedtime.   Marland Kitchen escitalopram (LEXAPRO) 5 MG tablet  Take 5 mg by mouth daily. For Depression  . FAMOTIDINE PO Take 20 mg by mouth daily. Syrup  . guaiFENesin (ROBAFEN MUCUS/CHEST CONGESTION) 100 MG/5ML liquid TAKE 10 ML VIA TUBE 4 TIMES A DAY FOR COUGH?CONGESTION  . ipratropium-albuterol (DUONEB) 0.5-2.5 (3) MG/3ML SOLN Take 3 mLs by nebulization. INHALE 1 VIAL VIA NEBULIZER 4 TIMES A DAY FOR COPD  . LORazepam (ATIVAN) 0.5 MG tablet GIVE 1 TABLET BY MOUTH OR PEG EVERY 4 HOURS AS NEEDED FOR AGITATION  .  Menthol, Topical Analgesic, (BIOFREEZE) 4 % GEL Apply 1 application topically 2 (two) times daily as needed (Pain). Apply to bilateral hands   . Nutritional Supplements (JEVITY PO) Place into feeding tube. DIABETA SOURCE AT 8O ML / HR VIA G/T CONTINUOUSLY FOR NUTRITION  . OXYGEN Inhale 2 L into the lungs See admin instructions. 2 liters of oxygen as needed for hypoxia and KEEP SAT AT >90%  . QUEtiapine (SEROQUEL) 25 MG tablet Place 25 mg into feeding tube 2 (two) times daily.   . tamsulosin (FLOMAX) 0.4 MG CAPS capsule Take 1 capsule (0.4 mg total) by mouth daily.  . traMADol (ULTRAM) 50 MG tablet one tab via peg every six hours as needed for moderate-severe pain  . Water For Irrigation, Sterile (FREE WATER) SOLN Place 300 mLs into feeding tube 4 (four) times daily.   No facility-administered encounter medications on file as of 10/07/2018.     Review of Systems   This is limited secondary to patient's aphasic status please see HPI--- when I asked if he has pain he does not his head yes.    Immunization History  Administered Date(s) Administered  . Influenza-Unspecified 06/21/2015, 03/01/2018  . PPD Test 11/08/2015  . Pneumococcal Conjugate-13 09/06/2017, 03/16/2018  . Pneumococcal-Unspecified 06/21/2015   Pertinent  Health Maintenance Due  Topic Date Due  . OPHTHALMOLOGY EXAM  10/06/2018  . COLONOSCOPY  10/26/2018 (Originally 10/15/2000)  . FOOT EXAM  11/03/2018  . INFLUENZA VACCINE  12/07/2018  . HEMOGLOBIN A1C  01/27/2019  . URINE MICROALBUMIN  07/27/2019  . PNA vac Low Risk Adult (2 of 2 - PPSV23) 06/20/2020   Fall Risk  01/03/2018 03/09/2017 01/02/2017 11/15/2016 10/13/2016  Falls in the past year? Yes Exclusion - non ambulatory Yes No Yes  Comment - - - - -  Number falls in past yr: 1 - 2 or more - 2 or more  Comment - - - - -  Injury with Fall? Yes - No - No  Risk Factor Category  - - - - High Fall Risk  Risk for fall due to : - - - - -  Follow up - - - - -   Functional  Status Survey:      Physical Exam   Temperature is 98.1 pulse 75 respirations 18 blood pressure 132/71.  In general this is a pleasant elderly male he does not appear to be in any distress  His skin is warm and dry- eyes visual acuity appears to be intact he is making eye contact.  Oropharynx is clear mucous membranes slightly dry.  Chest is clear to auscultation continues to have shallow air entry there is no labored breathing.  Heart continues to have distant heart sounds regular rate and rhythm from what I could auscultate and per radial pulse he does not really have significant edema.  Abdomen is soft nontender with positive bowel sounds PEG tube is in place.  GU he does have a Foley catheter and appears to have slight  tea colored presentation--catheter bag is about half full  Musculoskeletal has a left hallux sided hemiparalysis-he does have some movement of his right upper extremity and right lower extremity to a smaller extent.  He does not appear to have acute discomfort with gentle flexion and extension of his legs and at the hips- although he does have significant stiffness Has baseline movement of his right hand.  Neurologic as noted above he is a phasic he continues to be pleasant and alert.  Psych as noted above he does appear to be alert follows commands responds appropriately with limitations of his aphasia   Labs reviewed: Recent Labs    07/08/18 0555  07/09/18 0510  07/10/18 0540  07/30/18 0734 07/31/18 0246 08/01/18 0612  NA 150*   < > 141   < > 141   < > 154* 154* 147*  K 4.2   < > 4.0   < > 4.3   < > 4.2 4.3 5.6*  CL 120*   < > 114*   < > 111   < > 121* 126* 117*  CO2 23   < > 23   < > 23   < > 21* 23 22  GLUCOSE 214*   < > 219*   < > 192*   < > 132* 121* 98  BUN 53*   < > 32*   < > 28*   < > 55* 50* 32*  CREATININE 1.30*   < > 1.23   < > 1.17   < > 1.55* 1.36* 1.18  CALCIUM 8.3*   < > 8.0*   < > 8.2*   < > 9.4 8.8* 8.6*  MG 2.5*  --  2.0  --   2.1  --   --   --   --    < > = values in this interval not displayed.   Recent Labs    01/23/18 05/22/18 07/27/18 1314  AST 29 29 43*  ALT 32 30 26  ALKPHOS 137* 132* 78  BILITOT  --   --  0.4  PROT  --   --  7.0  ALBUMIN  --   --  2.0*   Recent Labs    07/06/18 0250  07/08/18 0555  07/27/18 1314 07/28/18 0316 07/31/18 1023  WBC 16.9*   < > 10.9*   < > 11.4* 13.1* 13.1*  NEUTROABS 15.2*  --  6.0  --  10.1*  --   --   HGB 16.2   < > 14.6   < > 9.9* 10.7* 11.9*  HCT 52.6*   < > 46.0   < > 32.2* 33.7* 37.0*  MCV 91.6   < > 90.9   < > 91.2 91.1 90.9  PLT PLATELET CLUMPS NOTED ON SMEAR, COUNT APPEARS DECREASED   < > 90*   < > 134* 142* 276   < > = values in this interval not displayed.   Lab Results  Component Value Date   TSH 2.56 10/25/2017   Lab Results  Component Value Date   HGBA1C 6.8 (H) 07/27/2018   Lab Results  Component Value Date   CHOL 96 10/25/2017   HDL 29 (A) 10/25/2017   LDLCALC 50 10/25/2017   TRIG 86 10/25/2017   CHOLHDL 3.2 10/24/2015    Significant Diagnostic Results in last 30 days:  No results found.  Assessment/Plan  #1 pain management-he does have orders for Tylenol 4 times daily as well as Biofreeze topical- however this  is thought to be not totally effective will start tramadol 50 mg every 6 hours- originally this was written as as needed but with further discussion the next day (10/08/18) with his regular nurse as well as  Communication with other staff it appears he really needs this routinely and will change that to routine- hold for any respiratory depression or oversedation.  2.  History of fall- I cannot really appreciate any significant injuries-I believe his pain proceeded the fall-but this will have to be monitored.  3.-  History of what appears to be some hematuria- I have spoken with nursing who will contact hospice about any recommendations he does not appear to be uncomfortable in this regard.  #4 he does have a catheter with  history of urinary retention he has been started on a scopolamine patch it appears this weekend but will discontinue this secondary to his history of urinary retention--I did speak with his nurse today and would be hesitant to continue this order with his history-- continue to monitor   .  His aspirin is currently being held-secondary to some previous history of some scrotal bleeding- we have thought about restarting aspirin however with occasional  hematuria would be hesitant to do that at this point  [phone]

## 2018-10-08 ENCOUNTER — Encounter: Payer: Self-pay | Admitting: Internal Medicine

## 2018-10-09 ENCOUNTER — Non-Acute Institutional Stay (SKILLED_NURSING_FACILITY): Payer: Medicare Other | Admitting: Internal Medicine

## 2018-10-09 ENCOUNTER — Encounter: Payer: Self-pay | Admitting: Internal Medicine

## 2018-10-09 ENCOUNTER — Encounter (HOSPITAL_COMMUNITY): Payer: Self-pay

## 2018-10-09 ENCOUNTER — Emergency Department (HOSPITAL_COMMUNITY)
Admission: EM | Admit: 2018-10-09 | Discharge: 2018-10-09 | Disposition: A | Discharge status: Home / Self Care | Attending: Emergency Medicine | Admitting: Emergency Medicine

## 2018-10-09 ENCOUNTER — Other Ambulatory Visit: Payer: Self-pay

## 2018-10-09 DIAGNOSIS — T83098A Other mechanical complication of other indwelling urethral catheter, initial encounter: Secondary | ICD-10-CM | POA: Diagnosis not present

## 2018-10-09 DIAGNOSIS — E1122 Type 2 diabetes mellitus with diabetic chronic kidney disease: Secondary | ICD-10-CM | POA: Insufficient documentation

## 2018-10-09 DIAGNOSIS — T83091A Other mechanical complication of indwelling urethral catheter, initial encounter: Secondary | ICD-10-CM

## 2018-10-09 DIAGNOSIS — R102 Pelvic and perineal pain: Secondary | ICD-10-CM

## 2018-10-09 DIAGNOSIS — Z87891 Personal history of nicotine dependence: Secondary | ICD-10-CM | POA: Diagnosis not present

## 2018-10-09 DIAGNOSIS — N189 Chronic kidney disease, unspecified: Secondary | ICD-10-CM | POA: Diagnosis not present

## 2018-10-09 DIAGNOSIS — Z79899 Other long term (current) drug therapy: Secondary | ICD-10-CM | POA: Insufficient documentation

## 2018-10-09 DIAGNOSIS — Z436 Encounter for attention to other artificial openings of urinary tract: Secondary | ICD-10-CM | POA: Diagnosis not present

## 2018-10-09 DIAGNOSIS — R339 Retention of urine, unspecified: Secondary | ICD-10-CM

## 2018-10-09 DIAGNOSIS — I129 Hypertensive chronic kidney disease with stage 1 through stage 4 chronic kidney disease, or unspecified chronic kidney disease: Secondary | ICD-10-CM | POA: Diagnosis not present

## 2018-10-09 LAB — BASIC METABOLIC PANEL
Anion gap: 16 — ABNORMAL HIGH (ref 5–15)
BUN: 50 mg/dL — ABNORMAL HIGH (ref 8–23)
CO2: 22 mmol/L (ref 22–32)
Calcium: 9.7 mg/dL (ref 8.9–10.3)
Chloride: 102 mmol/L (ref 98–111)
Creatinine, Ser: 1.82 mg/dL — ABNORMAL HIGH (ref 0.61–1.24)
GFR calc Af Amer: 44 mL/min — ABNORMAL LOW (ref >60)
GFR calc non Af Amer: 38 mL/min — ABNORMAL LOW (ref >60)
Glucose, Bld: 106 mg/dL — ABNORMAL HIGH (ref 70–99)
Potassium: 4.8 mmol/L (ref 3.5–5.1)
Sodium: 140 mmol/L (ref 135–145)

## 2018-10-09 LAB — CBC WITH DIFFERENTIAL/PLATELET
Abs Immature Granulocytes: 0.07 10*3/uL (ref 0.00–0.07)
Basophils Absolute: 0 10*3/uL (ref 0.0–0.1)
Basophils Relative: 0 %
Eosinophils Absolute: 0.4 10*3/uL (ref 0.0–0.5)
Eosinophils Relative: 3 %
HCT: 45.6 % (ref 39.0–52.0)
Hemoglobin: 14.2 g/dL (ref 13.0–17.0)
Immature Granulocytes: 1 %
Lymphocytes Relative: 18 %
Lymphs Abs: 2.7 10*3/uL (ref 0.7–4.0)
MCH: 27.2 pg (ref 26.0–34.0)
MCHC: 31.1 g/dL (ref 30.0–36.0)
MCV: 87.2 fL (ref 80.0–100.0)
Monocytes Absolute: 1.3 10*3/uL — ABNORMAL HIGH (ref 0.1–1.0)
Monocytes Relative: 9 %
Neutro Abs: 10.6 10*3/uL — ABNORMAL HIGH (ref 1.7–7.7)
Neutrophils Relative %: 69 %
Platelets: 280 10*3/uL (ref 150–400)
RBC: 5.23 MIL/uL (ref 4.22–5.81)
RDW: 14 % (ref 11.5–15.5)
WBC: 15.1 10*3/uL — ABNORMAL HIGH (ref 4.0–10.5)
nRBC: 0 % (ref 0.0–0.2)

## 2018-10-09 LAB — URINALYSIS, ROUTINE W REFLEX MICROSCOPIC
Bilirubin Urine: NEGATIVE
Glucose, UA: NEGATIVE mg/dL
Ketones, ur: NEGATIVE mg/dL
Nitrite: NEGATIVE
Protein, ur: >=300 mg/dL — AB
RBC / HPF: >50 RBC/hpf — ABNORMAL HIGH (ref 0–5)
Specific Gravity, Urine: 1.015 (ref 1.005–1.030)
pH: 9 — ABNORMAL HIGH (ref 5.0–8.0)

## 2018-10-09 MED ORDER — SODIUM CHLORIDE 0.9 % IV BOLUS
1000.0000 mL | Freq: Once | INTRAVENOUS | Status: AC
  Administered 2018-10-09: 1000 mL via INTRAVENOUS

## 2018-10-09 MED ORDER — SODIUM CHLORIDE 0.9 % IV BOLUS: 500.0000 mL | Freq: Once | INTRAVENOUS | Status: DC

## 2018-10-09 NOTE — ED Notes (Signed)
[facility] called for update.  Pt to return with new foley in place.

## 2018-10-09 NOTE — Progress Notes (Signed)
Location:    [facility] Room Number: 120/A Place of Service:  SNF [phone]) Provider:  Loraine Maple, MD  Patient Care Team: [name], MD as PCP - General (Internal Medicine) [place] as Referring Physician (General Practice) Letta Pate Luanna Salk, MD as Consulting Physician (Physical Medicine and Rehabilitation) Rosalin Hawking, MD as Consulting Physician (Neurology)  Extended Emergency Contact Information Primary Emergency Contact: Ivan Anchors [place] of [place] Phone: [phone] Relation: Brother  Code Status:  Full Code Goals of care: Advanced Directive information Advanced Directives 10/09/2018  Does Patient Have a Medical Advance Directive? Yes  Type of Advance Directive (No Data)  Does patient want to make changes to medical advance directive? No - Patient declined  Copy of [place] in Chart? No - copy requested  Would patient like information on creating a medical advance directive? No - Patient declined   Chief complaint-acute visit secondary to suspected urinary retention    HPI:  Pt is a 68 y.o. male seen today for an acute visit for complaints of suprapubic pain with minimal urinary output.  Patient is a long-term resident of the facility he is under hospice services with orders for comfort care.  He has a history of CVA with dysphasia status post PEG tube as well as type 2 diabetes depression gout urinary retention failure to thrive and hypertension.  He actually appears to have improved recently.  He does have a history of urinary retention has a chronic indwelling Foley catheter and continues on Flomax.  Apparently over the last day or 2 he has had complaints of increased suprapubic comfort-and apparently urinary output has been significantly reduced.  When I saw him on Monday I did note an order for the scopolamine patch  over the weekend and I discontinued  this not sure if he actually received it over the weekend  I did see him for somewhat generalized pain Monday and he was started on tramadol.  However over the last day or 2 it appears he has had increasing pain more in the suprapubic area and his urinary output has decreased significantly  He is afebrile  --blood pressure *mildly elevated I suspect this is secondary to pain.  He is largely aphasic but does appear to be having some discomfort   Past Medical History:  Diagnosis Date   Diabetes mellitus without complication ([place])    Dysarthria    Dysphagia    GI bleed    Hyperlipidemia    Hypertension    Renal insufficiency 09/22/2016   Stroke [facility])    Past Surgical History:  Procedure Laterality Date   ESOPHAGOGASTRODUODENOSCOPY (EGD) WITH PROPOFOL N/A 11/03/2015   Procedure: ESOPHAGOGASTRODUODENOSCOPY (EGD) WITH PROPOFOL;  Surgeon: Manus Gunning, MD;  Location: [facility];  Service: Gastroenterology;  Laterality: N/A;   IR REPLC GASTRO/COLONIC TUBE PERCUT W/FLUORO  12/29/2016   IR REPLC GASTRO/COLONIC TUBE PERCUT W/FLUORO  08/04/2018    Allergies  Allergen Reactions   Scopolamine     Urinary retention    Outpatient Encounter Medications as of 10/09/2018  Medication Sig   acetaminophen (TYLENOL) 325 MG tablet Place 650 mg into feeding tube. VIA TUBE 4 TIMES A DAY FOR PAIN   allopurinol (ZYLOPRIM) 150 mg TABS tablet Place 150 mg into feeding tube daily.   amLODipine (NORVASC) 5 MG tablet TAKE 1 TABLET VIA TUBE ONCE DAILY   atenolol (TENORMIN) 50 MG tablet Place 50 mg  into feeding tube See admin instructions. 50 mg per tube every 12 hours and hold if Systolic reading is <423   atorvastatin (LIPITOR) 80 MG tablet Place 80 mg into feeding tube at bedtime.    Carboxymethylcellulose Sodium (REFRESH LIQUIGEL) 1 % GEL Place 1 drop into both eyes 3 (three) times daily.    divalproex (DEPAKOTE) 125 MG DR tablet 125 mg at bedtime.    escitalopram (LEXAPRO) 5  MG tablet Take 5 mg by mouth daily. For Depression   FAMOTIDINE PO Take 20 mg by mouth daily. Syrup   guaiFENesin (ROBAFEN MUCUS/CHEST CONGESTION) 100 MG/5ML liquid TAKE 10 ML VIA TUBE 4 TIMES A DAY FOR COUGH?CONGESTION   ipratropium-albuterol (DUONEB) 0.5-2.5 (3) MG/3ML SOLN Take 3 mLs by nebulization. INHALE 1 VIAL VIA NEBULIZER 4 TIMES A DAY FOR COPD   LORazepam (ATIVAN) 0.5 MG tablet GIVE 1 TABLET BY MOUTH OR PEG EVERY 4 HOURS AS NEEDED FOR AGITATION   Menthol, Topical Analgesic, (BIOFREEZE) 4 % GEL Apply 1 application topically 2 (two) times daily as needed (Pain). Apply to bilateral hands    Nutritional Supplements (JEVITY PO) Place into feeding tube. DIABETA SOURCE AT 8O ML / HR VIA G/T CONTINUOUSLY FOR NUTRITION   OXYGEN Inhale 2 L into the lungs See admin instructions. 2 liters of oxygen as needed for hypoxia and KEEP SAT AT >90%   QUEtiapine (SEROQUEL) 25 MG tablet Place 25 mg into feeding tube 2 (two) times daily.    tamsulosin (FLOMAX) 0.4 MG CAPS capsule Take 1 capsule (0.4 mg total) by mouth daily.   traMADol (ULTRAM) 50 MG tablet One tab via peg every six hours as needed for moderate-severe pain   Water For Irrigation, Sterile (FREE WATER) SOLN Place 300 mLs into feeding tube 4 (four) times daily.   No facility-administered encounter medications on file as of 10/09/2018.     Review of Systems   Very limited secondary to patient's aphasic status please see HPI time he is verbalizing pain and points to his suprapubic area  Immunization History  Administered Date(s) Administered   Influenza-Unspecified 06/21/2015, 03/01/2018   PPD Test 11/08/2015   Pneumococcal Conjugate-13 09/06/2017, 03/16/2018   Pneumococcal-Unspecified 06/21/2015   Pertinent  Health Maintenance Due  Topic Date Due   OPHTHALMOLOGY EXAM  10/06/2018   COLONOSCOPY  10/26/2018 (Originally 10/15/2000)   FOOT EXAM  11/03/2018   INFLUENZA VACCINE  12/07/2018   HEMOGLOBIN A1C  01/27/2019    URINE MICROALBUMIN  07/27/2019   PNA vac Low Risk Adult (2 of 2 - PPSV23) 06/20/2020   Fall Risk  01/03/2018 03/09/2017 01/02/2017 11/15/2016 10/13/2016  Falls in the past year? Yes Exclusion - non ambulatory Yes No Yes  Comment - - - - -  Number falls in past yr: 1 - 2 or more - 2 or more  Comment - - - - -  Injury with Fall? Yes - No - No  Risk Factor Category  - - - - High Fall Risk  Risk for fall due to : - - - - -  Follow up - - - - -   Functional Status Survey:    Vitals:   10/09/18 1024  BP: (!) 160/79  Pulse: 68  Resp: 20  Temp: 98.4 F (36.9 C)  TempSrc: Oral  SpO2: 92%  Weight: 141 lb 9.6 oz (64.2 kg)  Height: 5\' 9"  (1.753 m)   Body mass index is 20.91 kg/m. Physical Exam   In general this is a pleasant elderly male does  not appear in acute distress but does appear to be uncomfortable.  Skin is warm and dry.  Chest is clear to auscultation with somewhat poor respiratory effort.  Heart is regular rate and rhythm somewhat distant heart sounds.  Abdomen is soft upper abdomen does not appear to be tender PEG site is in place-- bowel sounds are active.  Lower abdominal--- suprapubic area appears to have some distention with tenderness.  GU he does have an indwelling Foley catheter I do not note any drainage from the penis or bleeding however there is some dark-colored urine in the bag but minimal output  Muscle  skeletal at baseline does have some movement of his right upper upper extremity and to a lesser extent right lower extremity.  Neurologic as noted above he is aphasic  Psych he is pleasant cooperative but does appear to be uncomfortable  Labs reviewed: Recent Labs    07/08/18 0555  07/09/18 0510  07/10/18 0540  07/30/18 0734 07/31/18 0246 08/01/18 0612  NA 150*   < > 141   < > 141   < > 154* 154* 147*  K 4.2   < > 4.0   < > 4.3   < > 4.2 4.3 5.6*  CL 120*   < > 114*   < > 111   < > 121* 126* 117*  CO2 23   < > 23   < > 23   < > 21* 23 22    GLUCOSE 214*   < > 219*   < > 192*   < > 132* 121* 98  BUN 53*   < > 32*   < > 28*   < > 55* 50* 32*  CREATININE 1.30*   < > 1.23   < > 1.17   < > 1.55* 1.36* 1.18  CALCIUM 8.3*   < > 8.0*   < > 8.2*   < > 9.4 8.8* 8.6*  MG 2.5*  --  2.0  --  2.1  --   --   --   --    < > = values in this interval not displayed.   Recent Labs    01/23/18 05/22/18 07/27/18 1314  AST 29 29 43*  ALT 32 30 26  ALKPHOS 137* 132* 78  BILITOT  --   --  0.4  PROT  --   --  7.0  ALBUMIN  --   --  2.0*   Recent Labs    07/06/18 0250  07/08/18 0555  07/27/18 1314 07/28/18 0316 07/31/18 1023  WBC 16.9*   < > 10.9*   < > 11.4* 13.1* 13.1*  NEUTROABS 15.2*  --  6.0  --  10.1*  --   --   HGB 16.2   < > 14.6   < > 9.9* 10.7* 11.9*  HCT 52.6*   < > 46.0   < > 32.2* 33.7* 37.0*  MCV 91.6   < > 90.9   < > 91.2 91.1 90.9  PLT PLATELET CLUMPS NOTED ON SMEAR, COUNT APPEARS DECREASED   < > 90*   < > 134* 142* 276   < > = values in this interval not displayed.   Lab Results  Component Value Date   TSH 2.56 10/25/2017   Lab Results  Component Value Date   HGBA1C 6.8 (H) 07/27/2018   Lab Results  Component Value Date   CHOL 96 10/25/2017   HDL 29 (A) 10/25/2017   LDLCALC 50 10/25/2017  TRIG 86 10/25/2017   CHOLHDL 3.2 10/24/2015    Significant Diagnostic Results in last 30 days:  No results found.  Assessment/Plan  #1 suspected urinary retention-patient does appear to be having discomfort here- he is under comfort measures with hospice-- I did speak with the hospice nurse-- and will send him to the ER secondary to concerns for his comfort. Expect he will need urologic follow-up.  CPT- [phone]- of note greater than 35 minutes spent assessing patient-discussing his status with nursing staff as well as with the hospice nurse via face time- and coordinating a plan of care

## 2018-10-09 NOTE — ED Notes (Signed)
Bladder scan completed without gross urine able to visualize with multiple attempts. Pt is having intense pain and discomfort at this time.

## 2018-10-09 NOTE — ED Notes (Signed)
[facility] called @ 1526-per RN called by Levada Dy

## 2018-10-09 NOTE — ED Notes (Signed)
Pt pain free and resting comfortably post insertion.

## 2018-10-09 NOTE — ED Triage Notes (Signed)
Pt arrived via [facility]; pt from [facility] w/ c/o inability to urinate for 24 hrs; pt does have foley catether with an unknown insertion date; Pt has aphasia from previous stroke. 180/110; 100; 20; CBG 100; 97.5

## 2018-10-09 NOTE — Discharge Instructions (Signed)
It seemed as though there was an obstruction in the Foley catheter.  The Foley catheter was replaced with a new one.  1500 cc of urine was drained from the bladder. Recommend having patient evaluated sooner if urinary retention is suspected. There is a urine culture pending to assess for signs of acute infection. Follow-up with PCP.

## 2018-10-09 NOTE — ED Notes (Signed)
Pt DC via stretcher with [facility]

## 2018-10-09 NOTE — ED Triage Notes (Addendum)
Peri care given before pt transport to [facility] by Anselmo Pickler NT

## 2018-10-09 NOTE — ED Provider Notes (Signed)
[facility] EMERGENCY DEPARTMENT Provider Note   CSN: [id number] Arrival date & time: 10/09/18  1048    History   Chief Complaint Chief Complaint  Patient presents with  . Urinary Retention    HPI Sean Collins is a 68 y.o. male.     Level 5 caveat due to dysphasia at baseline.  HPI   Sean Collins is a 68 y.o. male, with a history of DM, previous stroke with dysarthria and dysphasia, renal insufficiency, HTN, and hyperlipidemia, presenting to the ED from nursing home for evaluation of urinary retention.  Per EMS, facility reports patient has had decreased urine output in his Foley catheter today. However, provider notes in chart review note patient has had decreased urine output and suprapubic discomfort for at least the last 2 days.      Past Medical History:  Diagnosis Date  . Diabetes mellitus without complication ([place])   . Dysarthria   . Dysphagia   . GI bleed   . Hyperlipidemia   . Hypertension   . Renal insufficiency 09/22/2016  . Stroke [facility])     Patient Active Problem List   Diagnosis Date Noted  . Normochromic anemia 09/11/2018  . Adult failure to thrive syndrome 08/06/2018  . Urinary retention 08/01/2018  . Hyperkalemia 07/27/2018  . Acute kidney injury ([place]) 07/27/2018  . Aphasia   . AKI (acute kidney injury) ([place]) 07/06/2018  . Neoplasm of skin of abdomen 04/23/2018  . Thrombocytopenia ([place]) 01/22/2018  . Renal insufficiency 09/22/2016  . Type 2 diabetes mellitus with diabetic nephropathy, without long-term current use of insulin ([place]) 09/13/2016  . Depressive psychosis ([place]) 09/13/2016  . Cervical myofascial pain syndrome 08/29/2016  . Cervical facet joint syndrome 08/29/2016  . Recurrent falls 07/11/2016  . HCAP (healthcare-associated pneumonia) 07/05/2016  . Major depressive disorder, recurrent episode ([place]) 04/17/2016  . Shoulder pain, left 02/07/2016  . Essential hypertension 12/08/2015  . Late effects of  CVA (cerebrovascular accident) 12/08/2015  . Cerebrovascular accident (CVA) due to thrombosis of right middle cerebral artery ([place])   . Slurred speech   . Malnutrition of moderate degree 11/04/2015  . Left hemiparesis ([place])   . Dysphagia   . Hyperlipidemia 11/16/2011  . Gout 11/16/2011  . History of stroke 11/16/2011  . CAD (coronary artery disease) 11/16/2011    Past Surgical History:  Procedure Laterality Date  . ESOPHAGOGASTRODUODENOSCOPY (EGD) WITH PROPOFOL N/A 11/03/2015   Procedure: ESOPHAGOGASTRODUODENOSCOPY (EGD) WITH PROPOFOL;  Surgeon: Manus Gunning, MD;  Location: [facility];  Service: Gastroenterology;  Laterality: N/A;  . IR REPLC GASTRO/COLONIC TUBE PERCUT W/FLUORO  12/29/2016  . IR REPLC GASTRO/COLONIC TUBE PERCUT W/FLUORO  08/04/2018        Home Medications    Prior to Admission medications   Medication Sig Start Date End Date Taking? Authorizing Provider  acetaminophen (TYLENOL) 325 MG tablet Place 650 mg into feeding tube. VIA TUBE 4 TIMES A DAY FOR PAIN   Yes [provider]  allopurinol (ZYLOPRIM) 150 mg TABS tablet Place 150 mg into feeding tube daily.   Yes [provider]  amLODipine (NORVASC) 5 MG tablet Place 5 mg into feeding tube daily. TAKE 1 TABLET VIA TUBE ONCE DAILY    Yes [provider]  atenolol (TENORMIN) 50 MG tablet Place 50 mg into feeding tube See admin instructions. 50 mg per tube every 12 hours and hold if Systolic reading is <627   Yes [provider]  atorvastatin (LIPITOR) 80 MG tablet Place  80 mg into feeding tube at bedtime.    Yes [provider]  Carboxymethylcellulose Sodium (REFRESH LIQUIGEL) 1 % GEL Place 1 drop into both eyes 3 (three) times daily.    Yes [provider]  divalproex (DEPAKOTE) 125 MG DR tablet 125 mg at bedtime.    Yes [provider]  escitalopram (LEXAPRO) 5 MG tablet Take 5 mg by mouth daily. For Depression   Yes [provider]   guaiFENesin (ROBAFEN MUCUS/CHEST CONGESTION) 100 MG/5ML liquid TAKE 10 ML VIA TUBE 4 TIMES A DAY FOR COUGH?CONGESTION   Yes [provider]  ipratropium-albuterol (DUONEB) 0.5-2.5 (3) MG/3ML SOLN Take 3 mLs by nebulization. INHALE 1 VIAL VIA NEBULIZER 4 TIMES A DAY FOR COPD   Yes [provider]  LORazepam (ATIVAN) 0.5 MG tablet Place 0.5 mg into feeding tube every 4 (four) hours as needed for anxiety. GIVE 1 TABLET BY MOUTH OR PEG EVERY 4 HOURS AS NEEDED FOR AGITATION    Yes [provider]  Menthol, Topical Analgesic, (BIOFREEZE) 4 % GEL Apply 1 application topically 2 (two) times daily as needed (Pain). Apply to bilateral hands    Yes [provider]  Nutritional Supplements (JEVITY PO) Place into feeding tube. DIABETA SOURCE AT 8O ML / HR VIA G/T CONTINUOUSLY FOR NUTRITION   Yes [provider]  OXYGEN Inhale 2 L into the lungs See admin instructions. 2 liters of oxygen as needed for hypoxia and KEEP SAT AT >90%   Yes [provider]  QUEtiapine (SEROQUEL) 25 MG tablet Place 25 mg into feeding tube 2 (two) times daily.    Yes [provider]  tamsulosin (FLOMAX) 0.4 MG CAPS capsule Take 1 capsule (0.4 mg total) by mouth daily. 08/02/18  Yes Danford, Suann Larry, MD  traMADol (ULTRAM) 50 MG tablet One tab via peg every six hours as needed for moderate-severe pain Patient taking differently: Take 50 mg by mouth every 6 (six) hours. One tab via peg every six hours as needed for moderate-severe pain 10/07/18  Yes [place], Arlo C, PA-C  FAMOTIDINE PO Give 20 mg by tube 2 (two) times a day. Syrup     [provider]  Water For Irrigation, Sterile (FREE WATER) SOLN Place 300 mLs into feeding tube 4 (four) times daily. 08/01/18   Danford, Suann Larry, MD    Family History Family History  Problem Relation Age of Onset  . Stroke Brother   . Diabetes Brother   . Stroke Brother   . Stroke Maternal Aunt     Social History Social  History   Tobacco Use  . Smoking status: Former Smoker    Packs/day: 0.50    Years: 32.00    Pack years: 16.00    Types: Cigarettes  . Smokeless tobacco: Never Used  . Tobacco comment: Started at age 63 though quit for 10 years at one point  Substance Use Topics  . Alcohol use: No    Alcohol/week: 0.0 standard drinks  . Drug use: No     Allergies   Scopolamine   Review of Systems Review of Systems  Unable to perform ROS: Patient nonverbal     Physical Exam Updated Vital Signs BP (!) 150/85 (BP Location: Right Arm)   Pulse 66   Temp 99.9 F (37.7 C) (Rectal)   Resp (!) 22   SpO2 97%   Physical Exam Vitals signs and nursing note reviewed.  Constitutional:      General: He is not in acute  distress.    Appearance: He is well-developed. He is not diaphoretic.  HENT:     Head: Normocephalic and atraumatic.     Mouth/Throat:     Mouth: Mucous membranes are moist.     Pharynx: Oropharynx is clear.  Eyes:     Conjunctiva/sclera: Conjunctivae normal.  Neck:     Musculoskeletal: Neck supple.  Cardiovascular:     Rate and Rhythm: Normal rate and regular rhythm.     Pulses: Normal pulses.          Radial pulses are 2+ on the right side and 2+ on the left side.       Posterior tibial pulses are 2+ on the right side and 2+ on the left side.     Heart sounds: Normal heart sounds.     Comments: Tactile temperature in the extremities appropriate and equal bilaterally. Pulmonary:     Effort: Pulmonary effort is normal. No respiratory distress.     Breath sounds: Normal breath sounds.  Abdominal:     Palpations: Abdomen is soft.     Tenderness: There is abdominal tenderness in the suprapubic area. There is no guarding.  Musculoskeletal:     Right lower leg: No edema.     Left lower leg: No edema.  Lymphadenopathy:     Cervical: No cervical adenopathy.  Skin:    General: Skin is warm and dry.  Neurological:     Mental Status: He is alert.  Psychiatric:        Mood  and Affect: Mood and affect normal.        Speech: Speech normal.        Behavior: Behavior normal.      ED Treatments / Results  Labs (all labs ordered are listed, but only abnormal results are displayed) Labs Reviewed  CBC WITH DIFFERENTIAL/PLATELET - Abnormal; Notable for the following components:      Result Value   WBC 15.1 (*)    Neutro Abs 10.6 (*)    Monocytes Absolute 1.3 (*)    All other components within normal limits  BASIC METABOLIC PANEL - Abnormal; Notable for the following components:   Glucose, Bld 106 (*)    BUN 50 (*)    Creatinine, Ser 1.82 (*)    GFR calc non Af Amer 38 (*)    GFR calc Af Amer 44 (*)    Anion gap 16 (*)    All other components within normal limits  URINALYSIS, ROUTINE W REFLEX MICROSCOPIC - Abnormal; Notable for the following components:   APPearance CLOUDY (*)    pH 9.0 (*)    Hgb urine dipstick SMALL (*)    Protein, ur >=300 (*)    Leukocytes,Ua LARGE (*)    RBC / HPF >50 (*)    Bacteria, UA RARE (*)    All other components within normal limits  URINE CULTURE    BUN  Date Value Ref Range Status  10/09/2018 50 (H) 8 - 23 mg/dL Final  08/01/2018 32 (H) 8 - 23 mg/dL Final  07/31/2018 50 (H) 8 - 23 mg/dL Final  07/30/2018 55 (H) 8 - 23 mg/dL Final  07/05/2018 99 (A) 4 - 21 Final  05/22/2018 38 (A) 4 - 21 Final  01/23/2018 31 (A) 4 - 21 Final  10/25/2017 43 (A) 4 - 21 Final   Creatinine, Ser  Date Value Ref Range Status  10/09/2018 1.82 (H) 0.61 - 1.24 mg/dL Final  08/01/2018 1.18 0.61 - 1.24 mg/dL Final  07/31/2018 1.36 (H) 0.61 - 1.24 mg/dL Final  07/30/2018 1.55 (H) 0.61 - 1.24 mg/dL Final     EKG None  Radiology No results found.  Procedures Procedures (including critical care time)  Medications Ordered in ED Medications  sodium chloride 0.9 % bolus 1,000 mL (1,000 mLs Intravenous New Bag/Given 10/09/18 1332)     Initial Impression / Assessment and Plan / ED Course  I have reviewed the triage vital signs and  the nursing notes.  Pertinent labs & imaging results that were available during my care of the patient were reviewed by me and considered in my medical decision making (see chart for details).  Clinical Course as of Oct 08 1352  Wed Oct 09, 2018  1129 Patient with prior stroke sent in from his facility for no urine in his catheter x24 hours.  Patient appears to be in pain and points to his groin.  Catheter does not look appropriately in position.  Will remove and replace catheter.  Check some basic labs.   [MB]    Clinical Course User Index [MB] Hayden Rasmussen, MD       Patient presents with urinary retention with Foley catheter in place. Patient is nontoxic appearing, afebrile, not tachycardic, not tachypneic, not hypotensive, maintains excellent SPO2 on room air. We replaced this Foley catheter and 1500 cc of urine were drained.  Patient experienced immediate relief.  Abdomen no longer tender.  No definite evidence of infection on UA.  Culture pending.  Small elevation in patient's creatinine and BUN, suspected to be due to the retention.  Regardless, patient was given IV hydration. Patient discharged back to his facility.  Findings and plan of care discussed with Aletta Edouard, MD. Dr. Melina Copa personally evaluated and examined this patient.  Vitals:   10/09/18 1230 10/09/18 1245 10/09/18 1300 10/09/18 1315  BP: 106/69 114/68 119/72 122/70  Pulse:      Resp:      Temp:      TempSrc:      SpO2:        Final Clinical Impressions(s) / ED Diagnoses   Final diagnoses:  Urinary retention  Obstruction of Foley catheter, initial encounter [facility])    ED Discharge Orders    None       Layla Maw 10/09/18 1358    Hayden Rasmussen, MD 10/09/18 [phone]

## 2018-10-10 ENCOUNTER — Encounter: Payer: Self-pay | Admitting: Internal Medicine

## 2018-10-10 ENCOUNTER — Non-Acute Institutional Stay (SKILLED_NURSING_FACILITY): Payer: Medicare Other | Admitting: Internal Medicine

## 2018-10-10 DIAGNOSIS — R52 Pain, unspecified: Secondary | ICD-10-CM | POA: Diagnosis not present

## 2018-10-10 DIAGNOSIS — Z8659 Personal history of other mental and behavioral disorders: Secondary | ICD-10-CM | POA: Diagnosis not present

## 2018-10-10 DIAGNOSIS — N390 Urinary tract infection, site not specified: Secondary | ICD-10-CM | POA: Diagnosis not present

## 2018-10-10 DIAGNOSIS — R339 Retention of urine, unspecified: Secondary | ICD-10-CM | POA: Diagnosis not present

## 2018-10-10 DIAGNOSIS — R319 Hematuria, unspecified: Secondary | ICD-10-CM

## 2018-10-10 NOTE — Progress Notes (Signed)
Location:    [facility] Room Number: 952/W Place of Service:  SNF [phone]) Provider:  Loraine Maple, MD  Patient Care Team: [name], MD as PCP - General (Internal Medicine) [place] as Referring Physician (General Practice) Letta Pate Luanna Salk, MD as Consulting Physician (Physical Medicine and Rehabilitation) Rosalin Hawking, MD as Consulting Physician (Neurology)  Extended Emergency Contact Information Primary Emergency Contact: Ivan Anchors [place] Phone: [phone] Relation: Brother  Code Status:  Full Code Goals of care: Advanced Directive information Advanced Directives 10/10/2018  Does Patient Have a Medical Advance Directive? Yes  Type of Advance Directive (No Data)  Does patient want to make changes to medical advance directive? No - Patient declined  Copy of [place] in Chart? No - copy requested  Would patient like information on creating a medical advance directive? No - Patient declined     Chief Complaint  Patient presents with  . Acute Visit    F/U from ER Visit  With urinary retention  HPI:  Pt is a 68 y.o. male seen today for an acute visit for follow-up of an ER visit was which prompted by acute urinary retention.  Patient is a long-term resident of facility currently under hospice care with priority on comfort care with end-stage He has a history of CVA with dysphasia status post PEG tube he also has type 2 diabetes depression gout urinary retention failure to thrive and hypertension.  He has a fairly significant history of urinary retention with a chronic indwelling Foley catheter and is on Flomax.  Yesterday he was found to be in acute discomfort with significant suprapubic distention-he was thought to be in acute urinary retention and there was concern that if his catheter was removed it may be difficult getting this replaced.  It was also  thought he could be in acute renal failure.  He was sent to the ER urgently- where he was assessed and the catheter was removed he did have significant urinary output apparently 1500 mL.  Apparently another catheter was inserted successfully and he was much more comfortable.  Urine culture also was done which appears to grown out Proteus greater than 100,000 colonies.  Labs were done which were essentially reassuring his white count was elevated at 15.1 hemoglobin was 14.2 platelets 280.  Sodium 140 potassium 4.8 BUN 50 creatinine 1.82 which appears to be improved.       Today he appears to be feeling significantly better at times apparently will complain of pain to nursing but apparently this was thought may be more anxiety related and his Ativan appeared to help he also has orders for tramadol which apparently are effective.  Vital signs appear to be stable he is afebrile- he is a phasic but continues to be pleasant and cooperative during exam  Past Medical History:  Diagnosis Date  . Diabetes mellitus without complication ([place])   . Dysarthria   . Dysphagia   . GI bleed   . Hyperlipidemia   . Hypertension   . Renal insufficiency 09/22/2016  . Stroke [facility])    Past Surgical History:  Procedure Laterality Date  . ESOPHAGOGASTRODUODENOSCOPY (EGD) WITH PROPOFOL N/A 11/03/2015   Procedure: ESOPHAGOGASTRODUODENOSCOPY (EGD) WITH PROPOFOL;  Surgeon: Manus Gunning, MD;  Location: [facility];  Service: Gastroenterology;  Laterality: N/A;  . IR REPLC GASTRO/COLONIC TUBE PERCUT W/FLUORO  12/29/2016  . IR REPLC GASTRO/COLONIC TUBE PERCUT  W/FLUORO  08/04/2018    Allergies  Allergen Reactions  . Scopolamine     Urinary retention    Outpatient Encounter Medications as of 10/10/2018  Medication Sig  . acetaminophen (TYLENOL) 325 MG tablet Place 650 mg into feeding tube. VIA TUBE 4 TIMES A DAY FOR PAIN  . allopurinol (ZYLOPRIM) 150 mg TABS tablet Place 150 mg into feeding tube  daily.  Marland Kitchen amLODipine (NORVASC) 5 MG tablet Place 5 mg into feeding tube daily. TAKE 1 TABLET VIA TUBE ONCE DAILY   . atenolol (TENORMIN) 50 MG tablet Place 50 mg into feeding tube See admin instructions. 50 mg per tube every 12 hours and hold if Systolic reading is <854  . atorvastatin (LIPITOR) 80 MG tablet Place 80 mg into feeding tube at bedtime.   . Carboxymethylcellulose Sodium (REFRESH LIQUIGEL) 1 % GEL Place 1 drop into both eyes 3 (three) times daily.   . divalproex (DEPAKOTE) 125 MG DR tablet 125 mg at bedtime.   Marland Kitchen escitalopram (LEXAPRO) 5 MG tablet Take 5 mg by mouth daily. For Depression  . FAMOTIDINE PO Give 20 mg by tube 2 (two) times a day. Syrup   . guaiFENesin (ROBAFEN MUCUS/CHEST CONGESTION) 100 MG/5ML liquid TAKE 10 ML VIA TUBE 4 TIMES A DAY FOR COUGH?CONGESTION  . ipratropium-albuterol (DUONEB) 0.5-2.5 (3) MG/3ML SOLN Take 3 mLs by nebulization. INHALE 1 VIAL VIA NEBULIZER 4 TIMES A DAY FOR COPD  . LORazepam (ATIVAN) 0.5 MG tablet Place 0.5 mg into feeding tube every 4 (four) hours as needed for anxiety. GIVE 1 TABLET BY MOUTH OR PEG EVERY 4 HOURS AS NEEDED FOR AGITATION   . Menthol, Topical Analgesic, (BIOFREEZE) 4 % GEL Apply thin layer to bilat hands for pain  . Nutritional Supplements (JEVITY PO) Place into feeding tube. DIABETA SOURCE AT 8O ML / HR VIA G/T CONTINUOUSLY FOR NUTRITION  . OXYGEN Inhale 2 L into the lungs See admin instructions. 2 liters of oxygen as needed for hypoxia and KEEP SAT AT >90%  . QUEtiapine (SEROQUEL) 25 MG tablet Place 25 mg into feeding tube 2 (two) times daily.   . tamsulosin (FLOMAX) 0.4 MG CAPS capsule Take 1 capsule (0.4 mg total) by mouth daily.  . traMADol (ULTRAM) 50 MG tablet TRAMADOL HCL 50 MG TABLET  . Water For Irrigation, Sterile (FREE WATER) SOLN Place 300 mLs into feeding tube 4 (four) times daily.  . [DISCONTINUED] traMADol (ULTRAM) 50 MG tablet One tab via peg every six hours as needed for moderate-severe pain (Patient taking  differently: TRAMADOL HCL 50 MG TABLET)   No facility-administered encounter medications on file as of 10/10/2018.     Review of Systems   Is largely unobtainable secondary to patient's aphasic status please see HPI  Immunization History  Administered Date(s) Administered  . Influenza-Unspecified 06/21/2015, 03/01/2018  . PPD Test 11/08/2015  . Pneumococcal Conjugate-13 09/06/2017, 03/16/2018  . Pneumococcal-Unspecified 06/21/2015   Pertinent  Health Maintenance Due  Topic Date Due  . COLONOSCOPY  10/26/2018 (Originally 10/15/2000)  . OPHTHALMOLOGY EXAM  11/09/2018 (Originally 10/06/2018)  . FOOT EXAM  11/03/2018  . INFLUENZA VACCINE  12/07/2018  . HEMOGLOBIN A1C  01/27/2019  . URINE MICROALBUMIN  07/27/2019  . PNA vac Low Risk Adult (2 of 2 - PPSV23) 06/20/2020   Fall Risk  01/03/2018 03/09/2017 01/02/2017 11/15/2016 10/13/2016  Falls in the past year? Yes Exclusion - non ambulatory Yes No Yes  Comment - - - - -  Number falls in past yr: 1 - 2  or more - 2 or more  Comment - - - - -  Injury with Fall? Yes - No - No  Risk Factor Category  - - - - High Fall Risk  Risk for fall due to : - - - - -  Follow up - - - - -   Functional Status Survey:    Temperature is 96.6 pulse is 57 respirations of 18 blood pressure 130/78  Physical Exam   In general this is a pleasant elderly male who does not appear to be in any distress he appears to be at his baseline lying in bed comfortably he is a phasic but does make eye contact he is quite alert.  His skin is warm and dry he is not diaphoretic.  Eyes visual acuity appears to be intact sclera are clear.  Oropharynx he did not open his mouth very wide but mucous membranes appeared a little dry.  Chest is clear to auscultation with shallow air entry and somewhat poor effort.  Heart is distant heart sounds regular rate and rhythm from what I could heare and per radial pulse he does not have significant lower extremity  Abdomen is soft  nontender with positive bowel sounds.  GU any distention appears significantly improved he does not really appear to have tenderness catheter is draining a small amount of amber-colored urine it appears the bag has recently been emptied There is amber-colored urine in the tube.  Musculoskeletal continues with left-sided hemiparesis with some movement of his right upper extremity and slight movement of his right lower extremity.  Neurologic as noted above he is a phasic.  Psych he is pleasant cooperative follows simple commands without any difficulty seems to indicate he is not having pain currently appears to be relatively back at his baseline.   Labs reviewed: Recent Labs    07/08/18 0555  07/09/18 0510  07/10/18 0540  07/31/18 0246 08/01/18 0612 10/09/18 1120  NA 150*   < > 141   < > 141   < > 154* 147* 140  K 4.2   < > 4.0   < > 4.3   < > 4.3 5.6* 4.8  CL 120*   < > 114*   < > 111   < > 126* 117* 102  CO2 23   < > 23   < > 23   < > 23 22 22   GLUCOSE 214*   < > 219*   < > 192*   < > 121* 98 106*  BUN 53*   < > 32*   < > 28*   < > 50* 32* 50*  CREATININE 1.30*   < > 1.23   < > 1.17   < > 1.36* 1.18 1.82*  CALCIUM 8.3*   < > 8.0*   < > 8.2*   < > 8.8* 8.6* 9.7  MG 2.5*  --  2.0  --  2.1  --   --   --   --    < > = values in this interval not displayed.   Recent Labs    01/23/18 05/22/18 07/27/18 1314  AST 29 29 43*  ALT 32 30 26  ALKPHOS 137* 132* 78  BILITOT  --   --  0.4  PROT  --   --  7.0  ALBUMIN  --   --  2.0*   Recent Labs    07/08/18 0555  07/27/18 1314 07/28/18 0316 07/31/18 1023 10/09/18 1120  WBC 10.9*   < >  11.4* 13.1* 13.1* 15.1*  NEUTROABS 6.0  --  10.1*  --   --  10.6*  HGB 14.6   < > 9.9* 10.7* 11.9* 14.2  HCT 46.0   < > 32.2* 33.7* 37.0* 45.6  MCV 90.9   < > 91.2 91.1 90.9 87.2  PLT 90*   < > 134* 142* 276 280   < > = values in this interval not displayed.   Lab Results  Component Value Date   TSH 2.56 10/25/2017   Lab Results  Component  Value Date   HGBA1C 6.8 (H) 07/27/2018   Lab Results  Component Value Date   CHOL 96 10/25/2017   HDL 29 (A) 10/25/2017   LDLCALC 50 10/25/2017   TRIG 86 10/25/2017   CHOLHDL 3.2 10/24/2015    Significant Diagnostic Results in last 30 days:  No results found.  Assessment/Plan  #1 history of urinary retention apparently had a significant amount of urine was appreciated in the ER-he has had another catheter inserted successfully and appears to be draining adequately-he appears to be relatively at his baseline but this will have to be watched.  A culture was done in the hospital and noticed this evening it has grown out greater than 100,000 colonies of Proteus-we will start him empirically on Cipro 250 mg twice daily for 7 days and a probiotic for 10 days-await final culture sensitivities.  He has been afebrile appears to be doing better.  2.  History of anxiety apparently this was somewhat significant earlier today complicated possibly with pain he does have PRN Ativan which is giving him relief he also has orders for tramadol.  He appears to be comfortable currently.  [place] PA-C [phone]

## 2018-10-11 LAB — URINE CULTURE: Culture: >=100000 — AB

## 2018-10-12 ENCOUNTER — Telehealth: Payer: Self-pay | Admitting: Emergency Medicine

## 2018-10-12 NOTE — Telephone Encounter (Signed)
Post ED Visit - Positive Culture Follow-up  Culture report reviewed by antimicrobial stewardship pharmacist: [place] Team []  Nathan Batchelder, Pharm.D. []  [place], Pharm.D., BCPS AQ-ID []  Heide Guile, Pharm.D., BCPS []  [place]une, Pharm.D., BCPS []  [place], Pharm.D., BCPS, AAHIVP []  [place], Pharm.D., BCPS, AAHIVP []  Legrand Como, PharmD, BCPS []  Salome Arnt, PharmD, BCPS []  Johnnette Gourd, PharmD, BCPS [x]  Hughes Better, PharmD []  Elicia Lamp, PharmD, BCPS []  Laqueta Linden, PharmD  [place] Team []  [address], PharmD []  Leodis Sias, PharmD []  Lindell Spar, PharmD []  Royetta Asal, Rph []  Graylin Shiver) Rema Fendt, PharmD []  Glennon Mac, PharmD []  Arlyn Dunning, PharmD []  Netta Cedars, PharmD []  Dia Sitter, PharmD []  Leone Haven, PharmD []  Gretta Arab, PharmD []  Theodis Shove, PharmD []  Peggyann Juba, PharmD   Positive urine culture Treated with Cipro, organism sensitive to the same and no further patient follow-up is required at this time.  Reuel Boom Harper Smoker 10/12/2018, 5:04 PM

## 2018-10-16 ENCOUNTER — Other Ambulatory Visit: Payer: Self-pay | Admitting: Internal Medicine

## 2018-10-16 ENCOUNTER — Non-Acute Institutional Stay (SKILLED_NURSING_FACILITY): Payer: Medicare Other | Admitting: Adult Health

## 2018-10-16 ENCOUNTER — Encounter: Payer: Self-pay | Admitting: Adult Health

## 2018-10-16 DIAGNOSIS — R001 Bradycardia, unspecified: Secondary | ICD-10-CM | POA: Diagnosis not present

## 2018-10-16 DIAGNOSIS — R339 Retention of urine, unspecified: Secondary | ICD-10-CM | POA: Diagnosis not present

## 2018-10-16 DIAGNOSIS — N390 Urinary tract infection, site not specified: Secondary | ICD-10-CM

## 2018-10-16 DIAGNOSIS — I10 Essential (primary) hypertension: Secondary | ICD-10-CM

## 2018-10-16 DIAGNOSIS — F419 Anxiety disorder, unspecified: Secondary | ICD-10-CM

## 2018-10-16 DIAGNOSIS — G8929 Other chronic pain: Secondary | ICD-10-CM | POA: Diagnosis not present

## 2018-10-16 DIAGNOSIS — M109 Gout, unspecified: Secondary | ICD-10-CM | POA: Diagnosis not present

## 2018-10-16 DIAGNOSIS — F39 Unspecified mood [affective] disorder: Secondary | ICD-10-CM

## 2018-10-16 DIAGNOSIS — I6930 Unspecified sequelae of cerebral infarction: Secondary | ICD-10-CM

## 2018-10-16 MED ORDER — TRAMADOL HCL 50 MG PO TABS: 50.0000 mg | ORAL_TABLET | Freq: Four times a day (QID) | ORAL | 0 refills | Status: DC

## 2018-10-16 NOTE — Progress Notes (Signed)
Location:  [place] Room Number: 998P Place of Service:  SNF (31) Provider:  Durenda Age, DNP, FNP-BC  Patient Care Team: [name], MD as PCP - General (Internal Medicine) Pierson as Referring Physician (General Practice) Letta Pate Luanna Salk, MD as Consulting Physician (Physical Medicine and Rehabilitation) Rosalin Hawking, MD as Consulting Physician (Neurology)  Extended Emergency Contact Information Primary Emergency Contact: Ivan An[place] Phone: [phone] Relation: Brother  Code Status:  Full Code  Goals of care: Advanced Directive information Advanced Directives 10/10/2018  Does Patient Have a Medical Advance Directive? Yes  Type of Advance Directive (No Data)  Does patient want to make changes to medical advance directive? No - Patient declined  Copy of [place] in Chart? No - copy requested  Would patient like information on creating a medical advance directive? No - Patient declined     Chief Complaint  Patient presents with   Acute Visit    Advance care planning    HPI:  Pt is a 68 y.o. male seen today for advance care planning, attended by NP, ADON, DON, Social Worker and brother. He was recently brought to the hospital due to painful urinary retention. Advance care planning was done today, attended by brother (via conference call), NP, DON, ADON and Education officer, museum. It was done in the Social Worker's office. Resident declined due to being tired. HCPOA, brother, chose to continue Full Code status and was irritated that he has to answer the same questions repeatedly. He was told that code status needs to be discussed during care plan meeting. It was discussed that resident is noted to be sleepy. He is currently on Tramadol and Ativan PRN. Brother agreed for Tramadol and Ativan dosage to be decreased. Review of his vital signs showed that his heart rate drops to 49, 53, 49.  Meeting lasted for 25 minutes.   Past Medical History:  Diagnosis Date   Diabetes mellitus without complication ([place])    Dysarthria    Dysphagia    GI bleed    Hyperlipidemia    Hypertension    Renal insufficiency 09/22/2016   Stroke [facility])    Past Surgical History:  Procedure Laterality Date   ESOPHAGOGASTRODUODENOSCOPY (EGD) WITH PROPOFOL N/A 11/03/2015   Procedure: ESOPHAGOGASTRODUODENOSCOPY (EGD) WITH PROPOFOL;  Surgeon: Manus Gunning, MD;  Location: [place];  Service: Gastroenterology;  Laterality: N/A;   IR REPLC GASTRO/COLONIC TUBE PERCUT W/FLUORO  12/29/2016   IR REPLC GASTRO/COLONIC TUBE PERCUT W/FLUORO  08/04/2018    Allergies  Allergen Reactions   Scopolamine     Urinary retention    Outpatient Encounter Medications as of 10/16/2018  Medication Sig   acetaminophen (TYLENOL) 325 MG tablet Place 650 mg into feeding tube. VIA TUBE 4 TIMES A DAY FOR PAIN   allopurinol (ZYLOPRIM) 150 mg TABS tablet Place 150 mg into feeding tube daily.   amLODipine (NORVASC) 5 MG tablet Place 5 mg into feeding tube daily. TAKE 1 TABLET VIA TUBE ONCE DAILY    atenolol (TENORMIN) 50 MG tablet Place 50 mg into feeding tube See admin instructions. 50 mg per tube every 12 hours and hold if Systolic reading is <734   atorvastatin (LIPITOR) 80 MG tablet Place 80 mg into feeding tube at bedtime.    Carboxymethylcellulose Sodium (REFRESH LIQUIGEL) 1 % GEL Place 1 drop into both eyes 3 (three) times daily.    divalproex (DEPAKOTE) 125 MG DR tablet 125 mg at bedtime.  escitalopram (LEXAPRO) 5 MG tablet Take 5 mg by mouth daily. For Depression   FAMOTIDINE PO Give 20 mg by tube 2 (two) times a day. Syrup    guaiFENesin (ROBAFEN MUCUS/CHEST CONGESTION) 100 MG/5ML liquid TAKE 10 ML VIA TUBE 4 TIMES A DAY FOR COUGH?CONGESTION   ipratropium-albuterol (DUONEB) 0.5-2.5 (3) MG/3ML SOLN Take 3 mLs by nebulization. INHALE 1 VIAL VIA NEBULIZER 4 TIMES A DAY FOR COPD    LORazepam (ATIVAN) 0.5 MG tablet Place 0.5 mg into feeding tube every 4 (four) hours as needed for anxiety. GIVE 1 TABLET BY MOUTH OR PEG EVERY 4 HOURS AS NEEDED FOR AGITATION    Menthol, Topical Analgesic, (BIOFREEZE) 4 % GEL Apply thin layer to bilat hands for pain   Nutritional Supplements (JEVITY PO) Place into feeding tube. DIABETA SOURCE AT 8O ML / HR VIA G/T CONTINUOUSLY FOR NUTRITION   OXYGEN Inhale 2 L into the lungs See admin instructions. 2 liters of oxygen as needed for hypoxia and KEEP SAT AT >90%   QUEtiapine (SEROQUEL) 25 MG tablet Place 25 mg into feeding tube 2 (two) times daily.    tamsulosin (FLOMAX) 0.4 MG CAPS capsule Take 1 capsule (0.4 mg total) by mouth daily.   traMADol (ULTRAM) 50 MG tablet Take 1 tablet (50 mg total) by mouth 4 (four) times daily. TRAMADOL HCL 50 MG TABLET   Water For Irrigation, Sterile (FREE WATER) SOLN Place 300 mLs into feeding tube 4 (four) times daily.   No facility-administered encounter medications on file as of 10/16/2018.     Review of Systems  Unable to attend due to being nonverbal   Immunization History  Administered Date(s) Administered   Influenza-Unspecified 06/21/2015, 03/01/2018   PPD Test 11/08/2015   Pneumococcal Conjugate-13 09/06/2017, 03/16/2018   Pneumococcal-Unspecified 06/21/2015   Pertinent  Health Maintenance Due  Topic Date Due   COLONOSCOPY  10/26/2018 (Originally 10/15/2000)   OPHTHALMOLOGY EXAM  11/09/2018 (Originally 10/06/2018)   FOOT EXAM  11/03/2018   INFLUENZA VACCINE  12/07/2018   HEMOGLOBIN A1C  01/27/2019   URINE MICROALBUMIN  07/27/2019   PNA vac Low Risk Adult (2 of 2 - PPSV23) 06/20/2020   Fall Risk  01/03/2018 03/09/2017 01/02/2017 11/15/2016 10/13/2016  Falls in the past year? Yes Exclusion - non ambulatory Yes No Yes  Comment - - - - -  Number falls in past yr: 1 - 2 or more - 2 or more  Comment - - - - -  Injury with Fall? Yes - No - No  Risk Factor Category  - - - - High Fall  Risk  Risk for fall due to : - - - - -  Follow up - - - - -     Vitals:   10/16/18 2343  BP: 134/88  Pulse: (!) 49  Resp: 19  Temp: 98.1 F (36.7 C)  Weight: 141 lb 9.6 oz (64.2 kg)  Height: 5\' 9"  (1.753 m)   Body mass index is 20.91 kg/m.  Physical Exam  GENERAL APPEARANCE: . In no acute distress. Normal body habitus SKIN:  Skin is warm and dry.  MOUTH and THROAT: Lips are without lesions.  RESPIRATORY: Breathing is even & unlabored, BS CTAB CARDIAC: RRR, no murmur,no extra heart sounds, no edema GI: +PEG tube, normal BS, no masses, no tenderness NEUROLOGICAL: +Left hemiplegia. Non-verbal PSYCHIATRIC:  Affect and behavior are appropriate  Labs reviewed: Recent Labs    07/08/18 0555  07/09/18 0510  07/10/18 0540  07/31/18 0246 08/01/18 0612 10/09/18  1120  NA 150*   < > 141   < > 141   < > 154* 147* 140  K 4.2   < > 4.0   < > 4.3   < > 4.3 5.6* 4.8  CL 120*   < > 114*   < > 111   < > 126* 117* 102  CO2 23   < > 23   < > 23   < > 23 22 22   GLUCOSE 214*   < > 219*   < > 192*   < > 121* 98 106*  BUN 53*   < > 32*   < > 28*   < > 50* 32* 50*  CREATININE 1.30*   < > 1.23   < > 1.17   < > 1.36* 1.18 1.82*  CALCIUM 8.3*   < > 8.0*   < > 8.2*   < > 8.8* 8.6* 9.7  MG 2.5*  --  2.0  --  2.1  --   --   --   --    < > = values in this interval not displayed.   Recent Labs    01/23/18 05/22/18 07/27/18 1314  AST 29 29 43*  ALT 32 30 26  ALKPHOS 137* 132* 78  BILITOT  --   --  0.4  PROT  --   --  7.0  ALBUMIN  --   --  2.0*   Recent Labs    07/08/18 0555  07/27/18 1314 07/28/18 0316 07/31/18 1023 10/09/18 1120  WBC 10.9*   < > 11.4* 13.1* 13.1* 15.1*  NEUTROABS 6.0  --  10.1*  --   --  10.6*  HGB 14.6   < > 9.9* 10.7* 11.9* 14.2  HCT 46.0   < > 32.2* 33.7* 37.0* 45.6  MCV 90.9   < > 91.2 91.1 90.9 87.2  PLT 90*   < > 134* 142* 276 280   < > = values in this interval not displayed.   Lab Results  Component Value Date   TSH 2.56 10/25/2017   Lab Results    Component Value Date   HGBA1C 6.8 (H) 07/27/2018   Lab Results  Component Value Date   CHOL 96 10/25/2017   HDL 29 (A) 10/25/2017   LDLCALC 50 10/25/2017   TRIG 86 10/25/2017   CHOLHDL 3.2 10/24/2015     Assessment/Plan  1. History of CVA with residual deficit -Has left-sided hemiplegia,, amlodipine 5 mg 1 tab daily, atorvastatin 80 mg 1 tab daily  2. Bradycardia -Discussed Medicare will decrease atenolol from 50 mg twice a day to 25 mg 1 tab twice a day, monitor heart rate  3. Anxiety -Mood is a stable, will decrease Ativan 0.5 mg from every 4 hours as needed to every 8 hours PRN x14 days  4. Other chronic pain -Stable, continue tramadol 50 mg 1 tab every 12 hours and every 12 hours as needed  5. Gout, unspecified cause, unspecified chronicity, unspecified site -Continue allopurinol 150 mg 1 tab daily  6. Mood disorder (HCC) -Stable, continue Quetiapine 25 mg twice a day divalproex 125 mg 1 tab at bedtime  7. Essential hypertension -Well-controlled, continue atenolo 25 mg twice a day,   8. Urinary tract infection without hematuria, site unspecified -Continue ciprofloxacin 250 mg 1 tab once daily   9. Urinary retention -Was sent to the hospital recently due to urinary retention, continue tamsulosin 0.4 mg 1 capsule at bedtime, Foley catheter care  daily  10.  Advance care planning -Discussed medications, plan of care patient's plan of care with HCPOA and IDT     Family/ staff Communication: Discussed plan of care with brother and IDT.  Labs/tests ordered: None  Goals of care: Long-term care/hospice care.   Durenda Age, DNP, FNP-BC [facility] [phone] (Monday-Friday 8:00 a.m. - 5:00 p.m.) [phone] (after hours)

## 2018-10-21 ENCOUNTER — Encounter (HOSPITAL_COMMUNITY): Payer: Self-pay

## 2018-10-21 ENCOUNTER — Emergency Department (HOSPITAL_COMMUNITY)
Admission: EM | Admit: 2018-10-21 | Discharge: 2018-10-22 | Disposition: A | Discharge status: Home / Self Care | Payer: No Typology Code available for payment source | Attending: Emergency Medicine | Admitting: Emergency Medicine

## 2018-10-21 DIAGNOSIS — E119 Type 2 diabetes mellitus without complications: Secondary | ICD-10-CM | POA: Insufficient documentation

## 2018-10-21 DIAGNOSIS — Z87891 Personal history of nicotine dependence: Secondary | ICD-10-CM | POA: Insufficient documentation

## 2018-10-21 DIAGNOSIS — Z79899 Other long term (current) drug therapy: Secondary | ICD-10-CM | POA: Insufficient documentation

## 2018-10-21 DIAGNOSIS — T85528A Displacement of other gastrointestinal prosthetic devices, implants and grafts, initial encounter: Secondary | ICD-10-CM | POA: Diagnosis not present

## 2018-10-21 DIAGNOSIS — Y732 Prosthetic and other implants, materials and accessory gastroenterology and urology devices associated with adverse incidents: Secondary | ICD-10-CM | POA: Insufficient documentation

## 2018-10-21 DIAGNOSIS — K9423 Gastrostomy malfunction: Secondary | ICD-10-CM | POA: Diagnosis not present

## 2018-10-21 DIAGNOSIS — I10 Essential (primary) hypertension: Secondary | ICD-10-CM | POA: Insufficient documentation

## 2018-10-21 LAB — NOVEL CORONAVIRUS, NAA: SARS-CoV-2, NAA: NOT DETECTED

## 2018-10-21 NOTE — ED Provider Notes (Signed)
TIME SEEN: 11:09 PM  CHIEF COMPLAINT: G tube out  HPI: Patient is a 68 y.o. male with history of stroke with dysarthria and aphasia, left-sided weakness, hypertension, hyperlipidemia, chronic kidney disease, diabetes who presents to the emergency department after his G-tube became dislodged tonight.  Patient comes from [place] rehabilitation facility.  They report he had an 53 [place] G-tube in place.  It appears he has had this G-tube for at least several months.  Has been replaced in the emergency department in November 2018 and has been replaced by interventional radiology in March 2020.  He denies pain.  ROS: Level 5 caveat secondary to aphasia  PAST MEDICAL HISTORY/PAST SURGICAL HISTORY:  Past Medical History:  Diagnosis Date  . Diabetes mellitus without complication ([place])   . Dysarthria   . Dysphagia   . GI bleed   . Hyperlipidemia   . Hypertension   . Renal insufficiency 09/22/2016  . Stroke [facility])     MEDICATIONS:  Prior to Admission medications   Medication Sig Start Date End Date Taking? Authorizing Provider  acetaminophen (TYLENOL) 325 MG tablet Place 650 mg into feeding tube. VIA TUBE 4 TIMES A DAY FOR PAIN    [provider]  allopurinol (ZYLOPRIM) 150 mg TABS tablet Place 150 mg into feeding tube daily.    [provider]  amLODipine (NORVASC) 5 MG tablet Place 5 mg into feeding tube daily. TAKE 1 TABLET VIA TUBE ONCE DAILY     [provider]  atenolol (TENORMIN) 50 MG tablet Place 50 mg into feeding tube See admin instructions. 50 mg per tube every 12 hours and hold if Systolic reading is <253    [provider]  atorvastatin (LIPITOR) 80 MG tablet Place 80 mg into feeding tube at bedtime.     [provider]  Carboxymethylcellulose Sodium (REFRESH LIQUIGEL) 1 % GEL Place 1 drop into both eyes 3 (three) times daily.     [provider]  divalproex (DEPAKOTE) 125 MG DR tablet 125 mg at bedtime.     [provider]  escitalopram (LEXAPRO) 5 MG tablet Take 5 mg by mouth daily. For Depression    [provider]  FAMOTIDINE PO Give 20 mg by tube 2 (two) times a day. Syrup     [provider]  guaiFENesin (ROBAFEN MUCUS/CHEST CONGESTION) 100 MG/5ML liquid TAKE 10 ML VIA TUBE 4 TIMES A DAY FOR COUGH?CONGESTION    [provider]  ipratropium-albuterol (DUONEB) 0.5-2.5 (3) MG/3ML SOLN Take 3 mLs by nebulization. INHALE 1 VIAL VIA NEBULIZER 4 TIMES A DAY FOR COPD    [provider]  LORazepam (ATIVAN) 0.5 MG tablet Place 0.5 mg into feeding tube every 4 (four) hours as needed for anxiety. GIVE 1 TABLET BY MOUTH OR PEG EVERY 4 HOURS AS NEEDED FOR AGITATION     [provider]  Menthol, Topical Analgesic, (BIOFREEZE) 4 % GEL Apply thin layer to bilat hands for pain    [provider]  Nutritional Supplements (JEVITY PO) Place into feeding tube. DIABETA SOURCE AT 8O ML / HR VIA G/T CONTINUOUSLY FOR NUTRITION    [provider]  OXYGEN Inhale 2 L into the lungs See admin instructions. 2 liters of oxygen as needed for hypoxia and KEEP SAT AT >90%    [provider]  QUEtiapine (SEROQUEL) 25 MG tablet Place 25 mg into feeding tube 2 (two) times daily.     [provider]  tamsulosin (FLOMAX) 0.4 MG CAPS capsule  Take 1 capsule (0.4 mg total) by mouth daily. 08/02/18   Danford, Suann Larry, MD  traMADol (ULTRAM) 50 MG tablet Take 1 tablet (50 mg total) by mouth 4 (four) times daily. TRAMADOL HCL 50 MG TABLET 10/16/18   Granville Lewis C, PA-C  Water For Irrigation, Sterile (FREE WATER) SOLN Place 300 mLs into feeding tube 4 (four) times daily. 08/01/18   Danford, Suann Larry, MD    ALLERGIES:  Allergies  Allergen Reactions  . Scopolamine     Urinary retention    SOCIAL HISTORY:  Social History   Tobacco Use  . Smoking status: Former Smoker    Packs/day: 0.50    Years: 32.00    Pack years: 16.00    Types: Cigarettes  . Smokeless  tobacco: Never Used  . Tobacco comment: Started at age 67 though quit for 10 years at one point  Substance Use Topics  . Alcohol use: No    Alcohol/week: 0.0 standard drinks    FAMILY HISTORY: Family History  Problem Relation Age of Onset  . Stroke Brother   . Diabetes Brother   . Stroke Brother   . Stroke Maternal Aunt     EXAM: BP (!) 161/85   Pulse (!) 50   Temp 97.7 F (36.5 C)   Resp 18   SpO2 98%  CONSTITUTIONAL: Alert, chronically ill-appearing, in no distress HEAD: Normocephalic EYES: Conjunctivae clear, pupils appear equal, EOMI ENT: normal nose; moist mucous membranes NECK: Supple, no meningismus, no nuchal rigidity, no LAD  CARD: RRR RESP: Normal chest excursion without splinting or tachypnea; no hypoxia or respiratory distress, speaking full sentences ABD/GI: Normal bowel sounds; non-distended; soft, non-tender, no rebound, no guarding, no peritoneal signs, no hepatosplenomegaly, 14 French Foley catheter in place of patient's stoma in the upper abdomen BACK:  Normal ROM EXT: Normal ROM in all joints; no deformity noted SKIN: Normal color for age and race; warm; no rash on exposed skin NEURO: chronic left upper extremity contractures from previous stroke, mostly a phasic   MEDICAL DECISION MAKING: Patient here after G tube dislodged. Will attempt to replace in ED.  ED PROGRESS: I am unable to pass an 72 [place] G-tube in the ED.  Patient appears to be uncomfortable and is grunting, bearing down.  I have talked to radiology about having this done by interventional radiology.  I have called the PA line and left a message and placed the order for this to be done in the morning.  Due to the pandemic, I feel patient should be discharged back to [facility] rehab facility and interventional radiology will call to schedule the appointment tomorrow.  I reviewed all nursing notes, vitals, pertinent previous records, EKGs, lab and urine results, imaging (as available).       Kogler, Delice Bison, DO 10/22/18 [phone]

## 2018-10-21 NOTE — ED Triage Notes (Signed)
Pt was BIB [facility] due to patient pulling out his G tube. Pt in no pain at this time but is having some drainage from the stoma.

## 2018-10-22 ENCOUNTER — Other Ambulatory Visit (HOSPITAL_COMMUNITY): Payer: Self-pay | Admitting: Diagnostic Radiology

## 2018-10-22 ENCOUNTER — Inpatient Hospital Stay (HOSPITAL_COMMUNITY)
Admission: RE | Admit: 2018-10-22 | Discharge: 2018-10-22 | Disposition: A | Discharge status: Home / Self Care | Payer: Self-pay | Source: Ambulatory Visit | Attending: Diagnostic Radiology | Admitting: Diagnostic Radiology

## 2018-10-22 ENCOUNTER — Other Ambulatory Visit: Payer: Self-pay

## 2018-10-22 ENCOUNTER — Inpatient Hospital Stay (HOSPITAL_COMMUNITY): Admission: RE | Admit: 2018-10-22 | Payer: No Typology Code available for payment source | Source: Ambulatory Visit

## 2018-10-22 ENCOUNTER — Ambulatory Visit (HOSPITAL_COMMUNITY)
Admission: RE | Admit: 2018-10-22 | Discharge: 2018-10-22 | Disposition: A | Discharge status: Home / Self Care | Source: Ambulatory Visit | Attending: Diagnostic Radiology | Admitting: Diagnostic Radiology

## 2018-10-22 ENCOUNTER — Encounter (HOSPITAL_COMMUNITY): Payer: Self-pay | Admitting: Physician Assistant

## 2018-10-22 DIAGNOSIS — G459 Transient cerebral ischemic attack, unspecified: Secondary | ICD-10-CM | POA: Diagnosis not present

## 2018-10-22 DIAGNOSIS — Z431 Encounter for attention to gastrostomy: Secondary | ICD-10-CM | POA: Diagnosis not present

## 2018-10-22 DIAGNOSIS — Z931 Gastrostomy status: Secondary | ICD-10-CM

## 2018-10-22 DIAGNOSIS — M2550 Pain in unspecified joint: Secondary | ICD-10-CM | POA: Diagnosis not present

## 2018-10-22 DIAGNOSIS — Z7401 Bed confinement status: Secondary | ICD-10-CM | POA: Diagnosis not present

## 2018-10-22 HISTORY — PX: IR REPLACE G-TUBE SIMPLE WO FLUORO: IMG2323

## 2018-10-22 NOTE — ED Notes (Signed)
PTAR at bedside 

## 2018-10-22 NOTE — Procedures (Signed)
  Existing tract patency was maintained with a 14 fr red robinson catheter.  This was removed and a new balloon retention 14 fr gastrostomy tube was placed.  Gastric contents in the tube confirmed placement.  Judie Grieve Tenessa Marsee PA-C 10/22/2018 12:41 PM

## 2018-10-22 NOTE — Discharge Instructions (Addendum)
We have called Interventional Radiology at [phone] which is the PA line and they will call [facility] in the AM to get replacement of the G tube scheduled.  We have also placed the order for this to be done by interventional radiology.  We were unable to replace the G-tube in the emergency department.  This procedure needs to be done by interventional radiology in the future as this is now the second time that his G-tube has come out and was unable to be replaced in the ED.  Please keep the Foley catheter in place at this time to keep the tract open.  Please keep patient NPO.

## 2018-10-22 NOTE — ED Notes (Signed)
Given report to Sean Collins at the [facility].

## 2018-10-24 ENCOUNTER — Encounter: Payer: Self-pay | Admitting: Internal Medicine

## 2018-10-24 ENCOUNTER — Non-Acute Institutional Stay (SKILLED_NURSING_FACILITY): Payer: Medicare Other | Admitting: Internal Medicine

## 2018-10-24 DIAGNOSIS — R1310 Dysphagia, unspecified: Secondary | ICD-10-CM

## 2018-10-24 DIAGNOSIS — R4701 Aphasia: Secondary | ICD-10-CM

## 2018-10-24 DIAGNOSIS — R339 Retention of urine, unspecified: Secondary | ICD-10-CM | POA: Diagnosis not present

## 2018-10-24 DIAGNOSIS — E1121 Type 2 diabetes mellitus with diabetic nephropathy: Secondary | ICD-10-CM | POA: Diagnosis not present

## 2018-10-24 DIAGNOSIS — I63311 Cerebral infarction due to thrombosis of right middle cerebral artery: Secondary | ICD-10-CM | POA: Diagnosis not present

## 2018-10-24 DIAGNOSIS — I10 Essential (primary) hypertension: Secondary | ICD-10-CM | POA: Diagnosis not present

## 2018-10-24 NOTE — Progress Notes (Signed)
Location:  [facility] Room Number: 161-W Place of Service:  SNF [phone]) Provider:  Granville Lewis, PA-C  [name], MD  Patient Care Team: [name], MD as PCP - General (Internal Medicine) [place] as Referring Physician (General Practice) Letta Pate Luanna Salk, MD as Consulting Physician (Physical Medicine and Rehabilitation) Rosalin Hawking, MD as Consulting Physician (Neurology)  Extended Emergency Contact Information Primary Emergency Contact: Ivan Anchors [place] of [place] Phone: [phone] Relation: Brother  Code Status:  Full Code Goals of care: Advanced Directive information Advanced Directives 10/24/2018  Does Patient Have a Medical Advance Directive? Yes  Type of Advance Directive Out of facility DNR (pink MOST or yellow form)  Does patient want to make changes to medical advance directive? No - Patient declined  Copy of [place] in Chart? No - copy requested  Would patient like information on creating a medical advance directive? No - Patient declined  Pre-existing out of facility DNR order (yellow form or pink MOST form) Pink MOST form placed in chart (order not valid for inpatient use)     Chief Complaint  Patient presents with  . Medical Management of Chronic Issues    Routine [facility] visit   Medical management of chronic medical conditions including history of CVA with dysphasia PEG tube dependent- urinary retention-mood disorder- GERD-and hypertension   HPI:  Pt is a 68 y.o. male seen today for medical management of chronic diseases.  As noted above.  Most recent acute issue was patient's PEG tube coming out earlier this week he did go to the ER and did come back and subsequently this was eventually put in by interventional radiology.  He appears to be tolerating the new tube well he is PEG tube dependent with a history of dysphasia and history of CVA.  He is under hospice  services.  He also has a history of significant urinary retention and at one point went to the ER with significant retention-his catheter was replaced with good results and this has stabilized.  He also has a history of pain fairly generalized he does have an order for tramado PRN twice daily which apparently is helping some he also has Tylenol orders and topical Biofreeze.  Currently he is resting in bed comfortably he appears to indicate he is not having any discomfort at that time- he is a phasic with his history of CVA.  His atenolol also was recently reduced secondary to bradycardia I got a pulse in the 60s today apparently this has improved somewhat.  It appears that his tramadol was recently reduced secondary to sedation concerns  he bright alert today again he is a phasic which complicates the review of systems     Past Medical History:  Diagnosis Date  . Diabetes mellitus without complication ([place])   . Dysarthria   . Dysphagia   . GI bleed   . Hyperlipidemia   . Hypertension   . Renal insufficiency 09/22/2016  . Stroke [place])    Past Surgical History:  Procedure Laterality Date  . ESOPHAGOGASTRODUODENOSCOPY (EGD) WITH PROPOFOL N/A 11/03/2015   Procedure: ESOPHAGOGASTRODUODENOSCOPY (EGD) WITH PROPOFOL;  Surgeon: Manus Gunning, MD;  Location: [facility];  Service: Gastroenterology;  Laterality: N/A;  . IR REPLACE G-TUBE SIMPLE WO FLUORO  10/22/2018  . IR REPLC GASTRO/COLONIC TUBE PERCUT W/FLUORO  12/29/2016  . IR REPLC GASTRO/COLONIC TUBE PERCUT W/FLUORO  08/04/2018    Allergies  Allergen Reactions  . Scopolamine  Urinary retention    Outpatient Encounter Medications as of 10/24/2018  Medication Sig  . acetaminophen (TYLENOL) 325 MG tablet Place 650 mg into feeding tube. VIA TUBE 4 TIMES A DAY FOR PAIN  . allopurinol (ZYLOPRIM) 150 mg TABS tablet Place 150 mg into feeding tube daily.  Marland Kitchen amLODipine (NORVASC) 5 MG tablet Place 5 mg into feeding tube daily.  TAKE 1 TABLET VIA TUBE ONCE DAILY   . atenolol (TENORMIN) 25 MG tablet Take 25 mg by mouth 2 (two) times daily.  Marland Kitchen atorvastatin (LIPITOR) 80 MG tablet Place 80 mg into feeding tube at bedtime.   . Carboxymethylcellulose Sodium (REFRESH LIQUIGEL) 1 % GEL Place 1 drop into both eyes 3 (three) times daily.   . divalproex (DEPAKOTE) 125 MG DR tablet 125 mg at bedtime.   Marland Kitchen escitalopram (LEXAPRO) 5 MG tablet Take 5 mg by mouth daily. For Depression  . FAMOTIDINE PO Give 20 mg by tube 2 (two) times a day. Syrup   . guaiFENesin (ROBAFEN MUCUS/CHEST CONGESTION) 100 MG/5ML liquid TAKE 10 ML VIA TUBE 4 TIMES A DAY FOR COUGH?CONGESTION  . Menthol, Topical Analgesic, (BIOFREEZE) 4 % GEL Apply thin layer to bilat hands for pain  . Nutritional Supplements (JEVITY PO) Place into feeding tube. DIABETA SOURCE AT 8O ML / HR VIA G/T CONTINUOUSLY FOR NUTRITION  . OXYGEN Inhale 2 L into the lungs See admin instructions. 2 liters of oxygen as needed for hypoxia and KEEP SAT AT >90%  . QUEtiapine (SEROQUEL) 25 MG tablet Place 25 mg into feeding tube 2 (two) times daily.   . tamsulosin (FLOMAX) 0.4 MG CAPS capsule Take 1 capsule (0.4 mg total) by mouth daily.  . traMADol (ULTRAM) 50 MG tablet Place 50 mg into feeding tube every 12 (twelve) hours as needed for moderate pain or severe pain.  . Water For Irrigation, Sterile (FREE WATER) SOLN Place 300 mLs into feeding tube 4 (four) times daily.  . [DISCONTINUED] atenolol (TENORMIN) 50 MG tablet Place 50 mg into feeding tube See admin instructions. 50 mg per tube every 12 hours and hold if Systolic reading is <462  . [DISCONTINUED] ipratropium-albuterol (DUONEB) 0.5-2.5 (3) MG/3ML SOLN Take 3 mLs by nebulization. INHALE 1 VIAL VIA NEBULIZER 4 TIMES A DAY FOR COPD  . [DISCONTINUED] LORazepam (ATIVAN) 0.5 MG tablet Place 0.5 mg into feeding tube every 4 (four) hours as needed for anxiety. GIVE 1 TABLET BY MOUTH OR PEG EVERY 4 HOURS AS NEEDED FOR AGITATION   . [DISCONTINUED]  traMADol (ULTRAM) 50 MG tablet Take 1 tablet (50 mg total) by mouth 4 (four) times daily. TRAMADOL HCL 50 MG TABLET   No facility-administered encounter medications on file as of 10/24/2018.     Review of Systems   Caryl Pina unobtainable secondary to patient's aphasic status but when asked if he is having pain or discomfort he nods no  Immunization History  Administered Date(s) Administered  . Influenza-Unspecified 06/21/2015, 03/01/2018  . PPD Test 11/08/2015  . Pneumococcal Conjugate-13 09/06/2017, 03/16/2018  . Pneumococcal-Unspecified 06/21/2015   Pertinent  Health Maintenance Due  Topic Date Due  . COLONOSCOPY  10/26/2018 (Originally 10/15/2000)  . OPHTHALMOLOGY EXAM  11/09/2018 (Originally 10/06/2018)  . FOOT EXAM  11/03/2018  . INFLUENZA VACCINE  12/07/2018  . HEMOGLOBIN A1C  01/27/2019  . URINE MICROALBUMIN  07/27/2019  . PNA vac Low Risk Adult (2 of 2 - PPSV23) 06/20/2020   Fall Risk  01/03/2018 03/09/2017 01/02/2017 11/15/2016 10/13/2016  Falls in the past year? Yes Exclusion -  non ambulatory Yes No Yes  Comment - - - - -  Number falls in past yr: 1 - 2 or more - 2 or more  Comment - - - - -  Injury with Fall? Yes - No - No  Risk Factor Category  - - - - High Fall Risk  Risk for fall due to : - - - - -  Follow up - - - - -   Functional Status Survey:    Vitals:   10/24/18 1554  BP: 131/79  Pulse: 63  Resp: 16  Temp: 97.9 F (36.6 C)  TempSrc: Oral  Weight: 141 lb 9.6 oz (64.2 kg)  Height: 5\' 9"  (1.753 m)   Body mass index is 20.91 kg/m. Physical Exam   \In general this is a pleasant elderly male in no distress he is lying comfortably in bed.  His skin is warm and dry.  Eyes visual acuity appears intact sclera and conjunctive are clear.  Oropharynx he did not open his mouth very wide but mucous membranes appear at baseline slightly dry.  His chest clear to auscultation there is no labored breathing air entry is somewhat shallow.  Heart is distant heart  sounds from what I could auscultate and per radial pulse was regular rate and rhythm in the 60s.  Abdomen is soft nontender with positive bowel sounds PEG site is in place appears unremarkable without any surrounding erythema drainage.  GU does have an indwelling Foley catheter draining amber-colored urine.   Skill skeletal has left-sided hemiparesis does have some movement of his right arm and right lower extremity to some extent as well.  Neurologic he is a phasic he is alert-.  Does have left-sided deficits as noted above.  Psych continues to be pleasant and cooperative he is alert  Labs.    Labs reviewed: Recent Labs    07/08/18 0555  07/09/18 0510  07/10/18 0540  07/31/18 0246 08/01/18 0612 10/09/18 1120  NA 150*   < > 141   < > 141   < > 154* 147* 140  K 4.2   < > 4.0   < > 4.3   < > 4.3 5.6* 4.8  CL 120*   < > 114*   < > 111   < > 126* 117* 102  CO2 23   < > 23   < > 23   < > 23 22 22   GLUCOSE 214*   < > 219*   < > 192*   < > 121* 98 106*  BUN 53*   < > 32*   < > 28*   < > 50* 32* 50*  CREATININE 1.30*   < > 1.23   < > 1.17   < > 1.36* 1.18 1.82*  CALCIUM 8.3*   < > 8.0*   < > 8.2*   < > 8.8* 8.6* 9.7  MG 2.5*  --  2.0  --  2.1  --   --   --   --    < > = values in this interval not displayed.   Recent Labs    01/23/18 05/22/18 07/27/18 1314  AST 29 29 43*  ALT 32 30 26  ALKPHOS 137* 132* 78  BILITOT  --   --  0.4  PROT  --   --  7.0  ALBUMIN  --   --  2.0*   Recent Labs    07/08/18 0555  07/27/18 1314 07/28/18 0316 07/31/18 1023 10/09/18  1120  WBC 10.9*   < > 11.4* 13.1* 13.1* 15.1*  NEUTROABS 6.0  --  10.1*  --   --  10.6*  HGB 14.6   < > 9.9* 10.7* 11.9* 14.2  HCT 46.0   < > 32.2* 33.7* 37.0* 45.6  MCV 90.9   < > 91.2 91.1 90.9 87.2  PLT 90*   < > 134* 142* 276 280   < > = values in this interval not displayed.   Lab Results  Component Value Date   TSH 2.56 10/25/2017   Lab Results  Component Value Date   HGBA1C 6.8 (H) 07/27/2018    Lab Results  Component Value Date   CHOL 96 10/25/2017   HDL 29 (A) 10/25/2017   LDLCALC 50 10/25/2017   TRIG 86 10/25/2017   CHOLHDL 3.2 10/24/2015    Significant Diagnostic Results in last 30 days:  Ir Replace G-tube Simple Wo Fluoro  Result Date: 10/22/2018 INDICATION: Dislodged gastrostomy tube. EXAM: Placement of new gastrostomy tube. MEDICATIONS: None ANESTHESIA/SEDATION: No sedation medication given. CONTRAST:  None. COMPLICATIONS: None immediate. PROCEDURE: Patency of the gastrostomy tube tract was maintained using a 14 french Red Robinson catheter. This was removed at the bedside and a new balloon retention 14 french gastrostomy tube was easily placed. Gastric contents were seen in the tube which confirms placement. IMPRESSION: Successful placement of 14 French balloon retention gastrostomy tube. Tube is ready for use. Read by: Gareth Eagle, PA-C Electronically Signed   By: Markus Daft M.D.   On: 10/22/2018 12:38    Assessment/Plan  #1 history of CVA with dysphasia he is status post PEG tube again this was recently replaced-she does continuing on a statin he is also on Norvasc 5 mg a day and atenolol 25 mg twice daily this point continue supportive care he continues under hospice services.  2.  History of urinary retention he continues on Flomax- he does have an indwelling Foley catheter again this was recently replaced as well at this point appears to be functioning.  3.  History of mood disorder he does continue on Depakote 125 mg at night he is also on Seroquel 25 mg daily this apparently has been stable.  4 history of chronic pain he does continue on tramadol Tylenol and Biofreeze apparently tramadol dose was changed because of sedation concerns at this point appears to be pretty well tolerated.  5 history of hypertension he continues on Norvasc and atenolol as noted above appears to be stable recent blood pressures 131/79-138/78-122/76- 134/71   #6 history of anxiety continues  on Ativan as needed 0.5 mg every 8 hours  #7- pression he continues on low-dose Lexapro 5 mg a day  #8 GERD he continues on Pepcid.  9. Gout he is on allopurinol once a day 150 mg a day  10.-  Diabetes type 2 this appears to be diet-controlled she is recently 153-111 largely in the lower to mid 100s  CPT- [phone]

## 2018-10-27 LAB — NOVEL CORONAVIRUS, NAA: SARS-CoV-2, NAA: NEGATIVE

## 2018-11-06 ENCOUNTER — Non-Acute Institutional Stay (SKILLED_NURSING_FACILITY): Payer: Medicare Other | Admitting: Adult Health

## 2018-11-06 ENCOUNTER — Encounter: Payer: Self-pay | Admitting: Adult Health

## 2018-11-06 DIAGNOSIS — F39 Unspecified mood [affective] disorder: Secondary | ICD-10-CM | POA: Diagnosis not present

## 2018-11-06 DIAGNOSIS — E1122 Type 2 diabetes mellitus with diabetic chronic kidney disease: Secondary | ICD-10-CM | POA: Diagnosis not present

## 2018-11-06 DIAGNOSIS — I10 Essential (primary) hypertension: Secondary | ICD-10-CM | POA: Diagnosis not present

## 2018-11-06 DIAGNOSIS — N184 Chronic kidney disease, stage 4 (severe): Secondary | ICD-10-CM | POA: Diagnosis not present

## 2018-11-06 DIAGNOSIS — I69354 Hemiplegia and hemiparesis following cerebral infarction affecting left non-dominant side: Secondary | ICD-10-CM | POA: Diagnosis not present

## 2018-11-06 DIAGNOSIS — R001 Bradycardia, unspecified: Secondary | ICD-10-CM | POA: Diagnosis not present

## 2018-11-06 DIAGNOSIS — E43 Unspecified severe protein-calorie malnutrition: Secondary | ICD-10-CM | POA: Diagnosis not present

## 2018-11-06 DIAGNOSIS — E1121 Type 2 diabetes mellitus with diabetic nephropathy: Secondary | ICD-10-CM

## 2018-11-06 DIAGNOSIS — Z7401 Bed confinement status: Secondary | ICD-10-CM | POA: Diagnosis not present

## 2018-11-06 DIAGNOSIS — N400 Enlarged prostate without lower urinary tract symptoms: Secondary | ICD-10-CM | POA: Diagnosis not present

## 2018-11-06 DIAGNOSIS — I129 Hypertensive chronic kidney disease with stage 1 through stage 4 chronic kidney disease, or unspecified chronic kidney disease: Secondary | ICD-10-CM | POA: Diagnosis not present

## 2018-11-06 DIAGNOSIS — J9610 Chronic respiratory failure, unspecified whether with hypoxia or hypercapnia: Secondary | ICD-10-CM | POA: Diagnosis not present

## 2018-11-06 DIAGNOSIS — Z960 Presence of urogenital implants: Secondary | ICD-10-CM | POA: Diagnosis not present

## 2018-11-06 DIAGNOSIS — R159 Full incontinence of feces: Secondary | ICD-10-CM | POA: Diagnosis not present

## 2018-11-06 DIAGNOSIS — Z8719 Personal history of other diseases of the digestive system: Secondary | ICD-10-CM | POA: Diagnosis not present

## 2018-11-06 DIAGNOSIS — Z931 Gastrostomy status: Secondary | ICD-10-CM | POA: Diagnosis not present

## 2018-11-06 DIAGNOSIS — R1310 Dysphagia, unspecified: Secondary | ICD-10-CM | POA: Diagnosis not present

## 2018-11-06 NOTE — Progress Notes (Signed)
Location:  [facility] Room Number: 111-A Place of Service:  SNF (31) Provider:  Durenda Age, DNP, FNP-BC  Patient Care Team: [name], MD as PCP - General (Internal Medicine) [place] as Referring Physician (General Practice) Letta Pate Luanna Salk, MD as Consulting Physician (Physical Medicine and Rehabilitation) Rosalin Hawking, MD as Consulting Physician (Neurology)  Extended Emergency Contact Information Primary Emergency Contact: Ivan Anchors [place] of [place] Phone: [phone] Relation: Brother  Code Status:  Full Code  Goals of care: Advanced Directive information Advanced Directives 10/24/2018  Does Patient Have a Medical Advance Directive? -  Type of Advance Directive Out of facility DNR (pink MOST or yellow form)  Does patient want to make changes to medical advance directive? -  Copy of [place] in Chart? No - copy requested  Would patient like information on creating a medical advance directive? -  Pre-existing out of facility DNR order (yellow form or pink MOST form) Pink MOST form placed in chart (order not valid for inpatient use)     Chief Complaint  Patient presents with  . Acute Visit    Hypertension medication management    HPI:  Pt is a 68 y.o. male seen today for an acute visit for hypertensive medication manage. He is a long-term care resident of [facility].  He is also under the care of hospice.  He has a PMH of CVA, atherosclerosis, CAD, DM, HTN, and HLD. He was seen today in his room. Review of his HR noted to be in the 50S -56, 54, 69, 52. Hospice nurse was inquiring if Amlodipine can be discontinued. He is, also, on Atenolol 25 mg BID for hypertension.    Past Medical History:  Diagnosis Date  . Diabetes mellitus without complication ([place])   . Dysarthria   . Dysphagia   . GI bleed   . Hyperlipidemia   . Hypertension   . Renal insufficiency  09/22/2016  . Stroke [facility])    Past Surgical History:  Procedure Laterality Date  . ESOPHAGOGASTRODUODENOSCOPY (EGD) WITH PROPOFOL N/A 11/03/2015   Procedure: ESOPHAGOGASTRODUODENOSCOPY (EGD) WITH PROPOFOL;  Surgeon: Manus Gunning, MD;  Location: [facility];  Service: Gastroenterology;  Laterality: N/A;  . IR REPLACE G-TUBE SIMPLE WO FLUORO  10/22/2018  . IR REPLC GASTRO/COLONIC TUBE PERCUT W/FLUORO  12/29/2016  . IR REPLC GASTRO/COLONIC TUBE PERCUT W/FLUORO  08/04/2018    Allergies  Allergen Reactions  . Scopolamine     Urinary retention    Outpatient Encounter Medications as of 11/06/2018  Medication Sig  . acetaminophen (TYLENOL) 325 MG tablet Place 650 mg into feeding tube. VIA TUBE 4 TIMES A DAY FOR PAIN  . allopurinol (ZYLOPRIM) 150 mg TABS tablet Place 150 mg into feeding tube daily.  Marland Kitchen amLODipine (NORVASC) 5 MG tablet Place 5 mg into feeding tube daily. TAKE 1 TABLET VIA TUBE ONCE DAILY   . atenolol (TENORMIN) 25 MG tablet Take 25 mg by mouth 2 (two) times daily.  Marland Kitchen atorvastatin (LIPITOR) 80 MG tablet Place 80 mg into feeding tube at bedtime.   . Carboxymethylcellulose Sodium (REFRESH LIQUIGEL) 1 % GEL Place 1 drop into both eyes 3 (three) times daily.   . divalproex (DEPAKOTE) 125 MG DR tablet 125 mg at bedtime.   Marland Kitchen escitalopram (LEXAPRO) 5 MG tablet Take 5 mg by mouth daily. For Depression  . FAMOTIDINE PO Give 20 mg by tube 2 (two) times a day. Syrup   . guaiFENesin (  ROBAFEN MUCUS/CHEST CONGESTION) 100 MG/5ML liquid TAKE 10 ML VIA TUBE 4 TIMES A DAY FOR COUGH?CONGESTION  . Menthol, Topical Analgesic, (BIOFREEZE) 4 % GEL Apply thin layer to bilat hands for pain  . Nutritional Supplements (JEVITY PO) Place into feeding tube. DIABETA SOURCE AT 8O ML / HR VIA G/T CONTINUOUSLY FOR NUTRITION  . OXYGEN Inhale 2 L into the lungs See admin instructions. 2 liters of oxygen as needed for hypoxia and KEEP SAT AT >90%  . QUEtiapine (SEROQUEL) 25 MG tablet Place 25 mg into feeding  tube 2 (two) times daily.   . tamsulosin (FLOMAX) 0.4 MG CAPS capsule Take 1 capsule (0.4 mg total) by mouth daily.  . traMADol (ULTRAM) 50 MG tablet Place 50 mg into feeding tube every 12 (twelve) hours as needed for moderate pain or severe pain.  . Water For Irrigation, Sterile (FREE WATER) SOLN Place 300 mLs into feeding tube 4 (four) times daily.   No facility-administered encounter medications on file as of 11/06/2018.     Review of Systems  Unable to obtain due to aphasia    Immunization History  Administered Date(s) Administered  . Influenza-Unspecified 06/21/2015, 03/01/2018  . PPD Test 11/08/2015  . Pneumococcal Conjugate-13 09/06/2017, 03/16/2018  . Pneumococcal-Unspecified 06/21/2015   Pertinent  Health Maintenance Due  Topic Date Due  . COLONOSCOPY  10/15/2000  . FOOT EXAM  11/03/2018  . OPHTHALMOLOGY EXAM  11/09/2018 (Originally 10/06/2018)  . INFLUENZA VACCINE  12/07/2018  . HEMOGLOBIN A1C  01/27/2019  . URINE MICROALBUMIN  07/27/2019  . PNA vac Low Risk Adult (2 of 2 - PPSV23) 06/20/2020   Fall Risk  01/03/2018 03/09/2017 01/02/2017 11/15/2016 10/13/2016  Falls in the past year? Yes Exclusion - non ambulatory Yes No Yes  Comment - - - - -  Number falls in past yr: 1 - 2 or more - 2 or more  Comment - - - - -  Injury with Fall? Yes - No - No  Risk Factor Category  - - - - High Fall Risk  Risk for fall due to : - - - - -  Follow up - - - - -     Vitals:   11/06/18 1618  BP: 130/Sean  Pulse: 92  Resp: 19  Temp: 98 F (36.7 C)  TempSrc: Oral  SpO2: 98%  Weight: 141 lb 9.6 oz (64.2 kg)  Height: 5\' 9"  (1.753 m)   Body mass index is 20.91 kg/m.  Physical Exam  GENERAL APPEARANCE: Well nourished. In no acute distress. Normal body habitus SKIN:  Skin is warm and dry.  MOUTH and THROAT: Lips are without lesions. Oral mucosa is moist and without lesions.  RESPIRATORY: Breathing is even & unlabored, BS CTAB CARDIAC: RRR, no murmur,no extra heart sounds, no edema  GI: +PEG tube NEUROLOGICAL: There is no tremor. Aphasic, not able to move BLE and LUE. PSYCHIATRIC:  Affect and behavior are appropriate   Labs reviewed: Recent Labs    07/08/18 0555  07/09/18 0510  07/10/18 0540  07/31/18 0246 08/01/18 0612 10/09/18 1120  NA 150*   < > 141   < > 141   < > 154* 147* 140  K 4.2   < > 4.0   < > 4.3   < > 4.3 5.6* 4.8  CL 120*   < > 114*   < > 111   < > 126* 117* 102  CO2 23   < > 23   < > 23   < >  23 22 22   GLUCOSE 214*   < > 219*   < > 192*   < > 121* 98 106*  BUN 53*   < > 32*   < > 28*   < > 50* 32* 50*  CREATININE 1.30*   < > 1.23   < > 1.17   < > 1.36* 1.18 1.82*  CALCIUM 8.3*   < > 8.0*   < > 8.2*   < > 8.8* 8.6* 9.7  MG 2.5*  --  2.0  --  2.1  --   --   --   --    < > = values in this interval not displayed.   Recent Labs    01/23/18 05/22/18 07/27/18 1314  AST 29 29 43*  ALT 32 30 26  ALKPHOS 137* 132* 78  BILITOT  --   --  0.4  PROT  --   --  7.0  ALBUMIN  --   --  2.0*   Recent Labs    07/08/18 0555  07/27/18 1314 07/28/18 0316 07/31/18 1023 10/09/18 1120  WBC 10.9*   < > 11.4* 13.1* 13.1* 15.1*  NEUTROABS 6.0  --  10.1*  --   --  10.6*  HGB 14.6   < > 9.9* 10.7* 11.9* 14.2  HCT 46.0   < > 32.2* 33.7* 37.0* 45.6  MCV 90.9   < > 91.2 91.1 90.9 87.2  PLT 90*   < > 134* 142* 276 280   < > = values in this interval not displayed.   Lab Results  Component Value Date   TSH 2.56 10/25/2017   Lab Results  Component Value Date   HGBA1C 6.8 (H) 07/27/2018   Lab Results  Component Value Date   CHOL 96 10/25/2017   HDL 29 (A) 10/25/2017   LDLCALC 50 10/25/2017   TRIG 86 10/25/2017   CHOLHDL 3.2 10/24/2015    Significant Diagnostic Results in last 30 days:  Ir Replace G-tube Simple Wo Fluoro  Result Date: 10/22/2018 INDICATION: Dislodged gastrostomy tube. EXAM: Placement of new gastrostomy tube. MEDICATIONS: None ANESTHESIA/SEDATION: No sedation medication given. CONTRAST:  None. COMPLICATIONS: None immediate.  PROCEDURE: Patency of the gastrostomy tube tract was maintained using a 14 french Red Robinson catheter. This was removed at the bedside and a new balloon retention 14 french gastrostomy tube was easily placed. Gastric contents were seen in the tube which confirms placement. IMPRESSION: Successful placement of 14 French balloon retention gastrostomy tube. Tube is ready for use. Read by: Gareth Eagle, PA-C Electronically Signed   By: Markus Daft M.D.   On: 10/22/2018 12:38    Assessment/Plan  1. Bradycardia - HRs mostly in the 50s, will decrease Atenolol 25 mg from BID to Q AM, hold for SBP <105 and HR <60  2. Essential hypertension - stable, continue Amlodipine 10 mg 1 tab daily and continue atenolol, at a lowered dose, 25 mg daily  3.  Type 2 diabetes mellitus with diabetic nephropathy without long-term current use of insulin (HCC) Lab Results  Component Value Date   HGBA1C 6.8 (H) 07/27/2018  - diet-controlled       Family/ staff Communication:  Discussed plan of care with resident and charge nurse.  Labs/tests ordered:  None   Goals of care:   Long-term care/hospice.   Durenda Age, DNP, FNP-BC [facility] [phone] (Monday-Friday 8:00 a.m. - 5:00 p.m.) [phone] (after hours)

## 2018-11-12 ENCOUNTER — Encounter: Payer: Self-pay | Admitting: Adult Health

## 2018-11-12 NOTE — Progress Notes (Signed)
Location:  [place] Room Number: 111/A Place of Service:  SNF (31) Provider:  Durenda Age, DNP, FNP-BC  Patient Care Team: [name], MD as PCP - General (Internal Medicine) [place] as Referring Physician (General Practice) Letta Pate Luanna Salk, MD as Consulting Physician (Physical Medicine and Rehabilitation) Rosalin Hawking, MD as Consulting Physician (Neurology)  Extended Emergency Contact Information Primary Emergency Contact: Ivan Anchors [place] of [place] Phone: [phone] Relation: Brother  Code Status:  Full Code  Goals of care: Advanced Directive information Advanced Directives 11/12/2018  Does Patient Have a Medical Advance Directive? Yes  Type of Advance Directive (No Data)  Does patient want to make changes to medical advance directive? No - Patient declined  Copy of [place] in Chart? -  Would patient like information on creating a medical advance directive? No - Patient declined  Pre-existing out of facility DNR order (yellow form or pink MOST form) -     Chief Complaint  Patient presents with  . Medical Management of Chronic Issues    Routine visit of medical management    HPI:  Pt is a 68 y.o. male seen today for medical management of chronic diseases.     Past Medical History:  Diagnosis Date  . Diabetes mellitus without complication ([place])   . Dysarthria   . Dysphagia   . GI bleed   . Hyperlipidemia   . Hypertension   . Renal insufficiency 09/22/2016  . Stroke [facility])    Past Surgical History:  Procedure Laterality Date  . ESOPHAGOGASTRODUODENOSCOPY (EGD) WITH PROPOFOL N/A 11/03/2015   Procedure: ESOPHAGOGASTRODUODENOSCOPY (EGD) WITH PROPOFOL;  Surgeon: Manus Gunning, MD;  Location: [place];  Service: Gastroenterology;  Laterality: N/A;  . IR REPLACE G-TUBE SIMPLE WO FLUORO  10/22/2018  . IR REPLC GASTRO/COLONIC TUBE PERCUT W/FLUORO  12/29/2016  . IR REPLC  GASTRO/COLONIC TUBE PERCUT W/FLUORO  08/04/2018    Allergies  Allergen Reactions  . Scopolamine     Urinary retention    Outpatient Encounter Medications as of 11/12/2018  Medication Sig  . acetaminophen (TYLENOL) 325 MG tablet Place 650 mg into feeding tube. VIA TUBE 4 TIMES A DAY FOR PAIN  . allopurinol (ZYLOPRIM) 150 mg TABS tablet Place 150 mg into feeding tube daily.  Marland Kitchen amLODipine (NORVASC) 5 MG tablet Place 5 mg into feeding tube daily. TAKE 1 TABLET VIA TUBE ONCE DAILY   . atenolol (TENORMIN) 25 MG tablet Take 25 mg by mouth 2 (two) times daily.  Marland Kitchen atorvastatin (LIPITOR) 80 MG tablet Place 80 mg into feeding tube at bedtime.   . Carboxymethylcellulose Sodium (REFRESH LIQUIGEL) 1 % GEL Place 1 drop into both eyes 3 (three) times daily.   . divalproex (DEPAKOTE) 125 MG DR tablet 125 mg at bedtime.   Marland Kitchen escitalopram (LEXAPRO) 5 MG tablet Take 5 mg by mouth daily. For Depression  . guaiFENesin (ROBAFEN MUCUS/CHEST CONGESTION) 100 MG/5ML liquid TAKE 10 ML VIA TUBE 4 TIMES A DAY FOR COUGH?CONGESTION  . Menthol, Topical Analgesic, (BIOFREEZE) 4 % GEL Apply thin layer to bilat hands for pain  . Nutritional Supplements (JEVITY PO) Place into feeding tube. DIABETA SOURCE AT 8O ML / HR VIA G/T CONTINUOUSLY FOR NUTRITION  . OXYGEN Inhale 2 L into the lungs See admin instructions. 2 liters of oxygen as needed for hypoxia and KEEP SAT AT >90%  . QUEtiapine (SEROQUEL) 25 MG tablet Place 25 mg into feeding tube 2 (two) times daily.   Marland Kitchen  tamsulosin (FLOMAX) 0.4 MG CAPS capsule Take 1 capsule (0.4 mg total) by mouth daily.  . traMADol (ULTRAM) 50 MG tablet Place 50 mg into feeding tube every 12 (twelve) hours as needed for moderate pain or severe pain.  . Water For Irrigation, Sterile (FREE WATER) SOLN Place 300 mLs into feeding tube 4 (four) times daily.  . [DISCONTINUED] FAMOTIDINE PO Give 20 mg by tube 2 (two) times a day. Syrup    No facility-administered encounter medications on file as of  11/12/2018.     Review of Systems  GENERAL: No change in appetite, no fatigue, no weight changes, no fever, chills or weakness SKIN: Denies rash, itching, wounds, ulcer sores, or nail abnormalities EYES: Denies change in vision, dry eyes, eye pain, itching or discharge EARS: Denies change in hearing, ringing in ears, or earache NOSE: Denies nasal congestion or epistaxis MOUTH and THROAT: Denies oral discomfort, gingival pain or bleeding, pain from teeth or hoarseness   RESPIRATORY: no cough, SOB, DOE, wheezing, hemoptysis CARDIAC: No chest pain, edema or palpitations GI: No abdominal pain, diarrhea, constipation, heart burn, nausea or vomiting GU: Denies dysuria, frequency, hematuria, incontinence, or discharge MUSCULOSKELETAL: Denies joint pain, muscle pain, back pain, restricted movement, or unusual weakness CIRCULATION: Denies claudication, edema of legs, varicosities, or cold extremities NEUROLOGICAL: Denies dizziness, syncope, numbness, or headache PSYCHIATRIC: Denies feelings of depression or anxiety. No report of hallucinations, insomnia, paranoia, or agitation ENDOCRINE: Denies polyphagia, polyuria, polydipsia, heat or cold intolerance HEME/LYMPH: Denies excessive bruising, petechia, enlarged lymph nodes, or bleeding problems IMMUNOLOGIC: Denies history of frequent infections, AIDS, or use of immunosuppressive agents   Immunization History  Administered Date(s) Administered  . Influenza-Unspecified 06/21/2015, 03/01/2018  . PPD Test 11/08/2015  . Pneumococcal Conjugate-13 09/06/2017, 03/16/2018  . Pneumococcal-Unspecified 06/21/2015   Pertinent  Health Maintenance Due  Topic Date Due  . FOOT EXAM  12/13/2018 (Originally 11/03/2018)  . OPHTHALMOLOGY EXAM  12/13/2018 (Originally 10/06/2018)  . COLONOSCOPY  12/13/2018 (Originally 10/15/2000)  . INFLUENZA VACCINE  12/07/2018  . HEMOGLOBIN A1C  01/27/2019  . URINE MICROALBUMIN  07/27/2019  . PNA vac Low Risk Adult (2 of 2 -  PPSV23) 06/20/2020   Fall Risk  01/03/2018 03/09/2017 01/02/2017 11/15/2016 10/13/2016  Falls in the past year? Yes Exclusion - non ambulatory Yes No Yes  Comment - - - - -  Number falls in past yr: 1 - 2 or more - 2 or more  Comment - - - - -  Injury with Fall? Yes - No - No  Risk Factor Category  - - - - High Fall Risk  Risk for fall due to : - - - - -  Follow up - - - - -     Vitals:   11/12/18 1001  BP: 138/62  Pulse: 76  Resp: 18  Temp: 98.3 F (36.8 C)  TempSrc: Oral  SpO2: 98%  Weight: 166 lb 3.2 oz (75.4 kg)  Height: 5\' 9"  (1.753 m)   Body mass index is 24.54 kg/m.  Physical Exam  GENERAL APPEARANCE: Well nourished. In no acute distress. Normal body habitus SKIN:  Skin is warm and dry. There are no suspicious lesions or rash HEAD: Normal in size and contour. No evidence of trauma EYES: Lids open and close normally. No blepharitis, entropion or ectropion. PERRL. Conjunctivae are clear and sclerae are white. Lenses are without opacity EARS: Pinnae are normal. Patient hears normal voice tunes of the examiner MOUTH and THROAT: Lips are without lesions. Oral mucosa is  moist and without lesions. Tongue is normal in shape, size, and color and without lesions NECK: supple, trachea midline, no neck masses, no thyroid tenderness, no thyromegaly LYMPHATICS: No LAN in the neck, no supraclavicular LAN RESPIRATORY: Breathing is even & unlabored, BS CTAB CARDIAC: RRR, no murmur,no extra heart sounds, no edema GI: Abdomen soft, normal BS, no masses, no tenderness, no hepatomegaly, no splenomegaly MUSCULOSKELETAL: No deformities. Movement at each extremity is full and painless. Strength is 5/5 at each extremity. Back is without kyphosis or scoliosis CIRCULATION: Pedal pulses are 2+. There is no edema of the legs, ankles and feet NEUROLOGICAL: There is no tremor. Speech is clear PSYCHIATRIC: Alert and oriented X 3. Affect and behavior are appropriate  Labs reviewed: Recent Labs     07/08/18 0555  07/09/18 0510  07/10/18 0540  07/31/18 0246 08/01/18 0612 10/09/18 1120  NA 150*   < > 141   < > 141   < > 154* 147* 140  K 4.2   < > 4.0   < > 4.3   < > 4.3 5.6* 4.8  CL 120*   < > 114*   < > 111   < > 126* 117* 102  CO2 23   < > 23   < > 23   < > 23 22 22   GLUCOSE 214*   < > 219*   < > 192*   < > 121* 98 106*  BUN 53*   < > 32*   < > 28*   < > 50* 32* 50*  CREATININE 1.30*   < > 1.23   < > 1.17   < > 1.36* 1.18 1.82*  CALCIUM 8.3*   < > 8.0*   < > 8.2*   < > 8.8* 8.6* 9.7  MG 2.5*  --  2.0  --  2.1  --   --   --   --    < > = values in this interval not displayed.   Recent Labs    01/23/18 05/22/18 07/27/18 1314  AST 29 29 43*  ALT 32 30 26  ALKPHOS 137* 132* 78  BILITOT  --   --  0.4  PROT  --   --  7.0  ALBUMIN  --   --  2.0*   Recent Labs    07/08/18 0555  07/27/18 1314 07/28/18 0316 07/31/18 1023 10/09/18 1120  WBC 10.9*   < > 11.4* 13.1* 13.1* 15.1*  NEUTROABS 6.0  --  10.1*  --   --  10.6*  HGB 14.6   < > 9.9* 10.7* 11.9* 14.2  HCT 46.0   < > 32.2* 33.7* 37.0* 45.6  MCV 90.9   < > 91.2 91.1 90.9 87.2  PLT 90*   < > 134* 142* 276 280   < > = values in this interval not displayed.   Lab Results  Component Value Date   TSH 2.56 10/25/2017   Lab Results  Component Value Date   HGBA1C 6.8 (H) 07/27/2018   Lab Results  Component Value Date   CHOL 96 10/25/2017   HDL 29 (A) 10/25/2017   LDLCALC 50 10/25/2017   TRIG 86 10/25/2017   CHOLHDL 3.2 10/24/2015    Significant Diagnostic Results in last 30 days:  Ir Replace G-tube Simple Wo Fluoro  Result Date: 10/22/2018 INDICATION: Dislodged gastrostomy tube. EXAM: Placement of new gastrostomy tube. MEDICATIONS: None ANESTHESIA/SEDATION: No sedation medication given. CONTRAST:  None. COMPLICATIONS: None immediate. PROCEDURE: Patency of  the gastrostomy tube tract was maintained using a 14 french Red Robinson catheter. This was removed at the bedside and a new balloon retention 14 french  gastrostomy tube was easily placed. Gastric contents were seen in the tube which confirms placement. IMPRESSION: Successful placement of 14 French balloon retention gastrostomy tube. Tube is ready for use. Read by: Gareth Eagle, PA-C Electronically Signed   By: Markus Daft M.D.   On: 10/22/2018 12:38    Assessment/Plan    Family/ staff Communication:   Labs/tests ordered:    Goals of care:      Durenda Age, DNP, FNP-BC [facility] [phone] (Monday-Friday 8:00 a.m. - 5:00 p.m.) [phone] (after hours)  This encounter was created in error - please disregard.

## 2018-11-13 ENCOUNTER — Encounter: Payer: Self-pay | Admitting: Internal Medicine

## 2018-11-13 ENCOUNTER — Non-Acute Institutional Stay (SKILLED_NURSING_FACILITY): Payer: Medicare Other | Admitting: Internal Medicine

## 2018-11-13 ENCOUNTER — Other Ambulatory Visit: Payer: Self-pay | Admitting: Internal Medicine

## 2018-11-13 DIAGNOSIS — I63311 Cerebral infarction due to thrombosis of right middle cerebral artery: Secondary | ICD-10-CM

## 2018-11-13 DIAGNOSIS — R627 Adult failure to thrive: Secondary | ICD-10-CM | POA: Diagnosis not present

## 2018-11-13 DIAGNOSIS — R001 Bradycardia, unspecified: Secondary | ICD-10-CM | POA: Insufficient documentation

## 2018-11-13 DIAGNOSIS — R339 Retention of urine, unspecified: Secondary | ICD-10-CM | POA: Diagnosis not present

## 2018-11-13 DIAGNOSIS — E1149 Type 2 diabetes mellitus with other diabetic neurological complication: Secondary | ICD-10-CM | POA: Diagnosis not present

## 2018-11-13 DIAGNOSIS — I10 Essential (primary) hypertension: Secondary | ICD-10-CM

## 2018-11-13 MED ORDER — TRAMADOL HCL 50 MG PO TABS: 50.0000 mg | ORAL_TABLET | Freq: Two times a day (BID) | ORAL | 0 refills | Status: DC

## 2018-11-13 NOTE — Progress Notes (Signed)
Location:    [place] Room Number: 111/A Place of Service:  SNF [phone]) Provider:  [place] Lewis PA-C  [name], MD  Patient Care Team: [name], MD as PCP - General (Internal Medicine) [place] as Referring Physician (General Practice) Letta Pate Luanna Salk, MD as Consulting Physician (Physical Medicine and Rehabilitation) Rosalin Hawking, MD as Consulting Physician (Neurology)  Extended Emergency Contact Information Primary Emergency Contact: Ivan Anchors [place] of [place] Phone: [phone] Relation: Brother  Code Status:  Full Code Goals of care: Advanced Directive information Advanced Directives 11/13/2018  Does Patient Have a Medical Advance Directive? Yes  Type of Advance Directive (No Data)  Does patient want to make changes to medical advance directive? No - Patient declined  Copy of [place] in Chart? -  Would patient like information on creating a medical advance directive? No - Patient declined  Pre-existing out of facility DNR order (yellow form or pink MOST form) -    Chief complaint routine visit for medical management of chronic medical conditions including history of CVA- failure to thrive- type 2 diabetes-urinary retention- hypertension- History of dysphasia with PEG tube  HPI:  Pt is a 68 y.o. male seen today for medical management of chronic diseases.  As noted above.  He continues under hospice services with a history of failure to thrive with various contributing factors including a history of CVA as well as dysphasia and recurrent aspiration pneumonia.  Patient was just seen last week for occasional bradycardia and his atenolol apparently was reduced however looking at the medical record today it appears he is still on it twice a day will reduce this to once a day.  His pulse on exam was in the low 50s blood pressures appear to be stable.  He continues to be a  phasic but nursing does not report any concerns and when I spoke with him he did not really point me to any concerns.  Continues to be under fragile status.  At one point recently went to the ER with acute urinary retention his catheter was replaced stabilized he has a good amount of urine in his bag today.  Regards of pain management he has been started on routine tramadol which appears to help he also has Tylenol on deck as well as topical Biofreeze.  In regards to CVA he is on a statin aspirin currently held because of some bleeding issues including scrotal bleeding which appears to have resolved he also he does have some history of hematuria as well  He is a type II diabetic this is diet-controlled and appears his CBGs are quite stable in the low 100s per chart review.  He also has a history of depression with anxiety he is on Lexapro- he also has orders for Seroquel twice a day for agitation which appears to be stabilized.  Regards to failure to thrive he is actually gained about 7% in the last 20 days which is a positive development   Past Medical History:  Diagnosis Date  . Diabetes mellitus without complication ([place])   . Dysarthria   . Dysphagia   . GI bleed   . Hyperlipidemia   . Hypertension   . Renal insufficiency 09/22/2016  . Stroke [facility])    Past Surgical History:  Procedure Laterality Date  . ESOPHAGOGASTRODUODENOSCOPY (EGD) WITH PROPOFOL N/A 11/03/2015   Procedure: ESOPHAGOGASTRODUODENOSCOPY (EGD) WITH PROPOFOL;  Surgeon: Manus Gunning, MD;  Location: [facility];  Service: Gastroenterology;  Laterality: N/A;  . IR REPLACE G-TUBE SIMPLE WO FLUORO  10/22/2018  . IR REPLC GASTRO/COLONIC TUBE PERCUT W/FLUORO  12/29/2016  . IR REPLC GASTRO/COLONIC TUBE PERCUT W/FLUORO  08/04/2018    Allergies  Allergen Reactions  . Scopolamine     Urinary retention    Allergies as of 11/13/2018      Reactions   Scopolamine    Urinary retention      Medication List        Accurate as of November 13, 2018  8:45 AM. If you have any questions, ask your nurse or doctor.        acetaminophen 325 MG tablet Commonly known as: TYLENOL Place 650 mg into feeding tube. VIA TUBE 4 TIMES A DAY FOR PAIN   allopurinol 150 mg Tabs tablet Commonly known as: ZYLOPRIM Place 150 mg into feeding tube daily.   amLODipine 5 MG tablet Commonly known as: NORVASC Place 5 mg into feeding tube daily. TAKE 1 TABLET VIA TUBE ONCE DAILY   atenolol 25 MG tablet Commonly known as: TENORMIN Take 25 mg by mouth 2 (two) times daily.   atorvastatin 80 MG tablet Commonly known as: LIPITOR Place 80 mg into feeding tube at bedtime.   Biofreeze 4 % Gel Generic drug: Menthol (Topical Analgesic) Apply thin layer to bilat hands for pain   divalproex 125 MG DR tablet Commonly known as: DEPAKOTE 125 mg at bedtime.   escitalopram 5 MG tablet Commonly known as: LEXAPRO Take 5 mg by mouth daily. For Depression   free water Soln Place 300 mLs into feeding tube 4 (four) times daily.   JEVITY PO Place into feeding tube. DIABETA SOURCE AT 8O ML / HR VIA G/T CONTINUOUSLY FOR NUTRITION   OXYGEN Inhale 2 L into the lungs See admin instructions. 2 liters of oxygen as needed for hypoxia and KEEP SAT AT >90%   Refresh Liquigel 1 % Gel Generic drug: Carboxymethylcellulose Sodium Place 1 drop into both eyes 3 (three) times daily.   Robafen Mucus/Chest Congestion 100 MG/5ML liquid Generic drug: guaiFENesin TAKE 10 ML VIA TUBE 4 TIMES A DAY FOR COUGH?CONGESTION   SEROquel 25 MG tablet Generic drug: QUEtiapine Place 25 mg into feeding tube 2 (two) times daily.   tamsulosin 0.4 MG Caps capsule Commonly known as: FLOMAX Take 1 capsule (0.4 mg total) by mouth daily.   traMADol 50 MG tablet Commonly known as: ULTRAM Place 50 mg into feeding tube every 12 (twelve) hours as needed for moderate pain or severe pain.       Review of Systems   This is essentially unobtainable secondary to a  phasic status please see HPI  Immunization History  Administered Date(s) Administered  . Influenza-Unspecified 06/21/2015, 03/01/2018  . PPD Test 11/08/2015  . Pneumococcal Conjugate-13 09/06/2017, 03/16/2018  . Pneumococcal-Unspecified 06/21/2015   Pertinent  Health Maintenance Due  Topic Date Due  . FOOT EXAM  12/13/2018 (Originally 11/03/2018)  . OPHTHALMOLOGY EXAM  12/13/2018 (Originally 10/06/2018)  . COLONOSCOPY  12/13/2018 (Originally 10/15/2000)  . INFLUENZA VACCINE  12/07/2018  . HEMOGLOBIN A1C  01/27/2019  . URINE MICROALBUMIN  07/27/2019  . PNA vac Low Risk Adult (2 of 2 - PPSV23) 06/20/2020   Fall Risk  01/03/2018 03/09/2017 01/02/2017 11/15/2016 10/13/2016  Falls in the past year? Yes Exclusion - non ambulatory Yes No Yes  Comment - - - - -  Number falls in past yr: 1 - 2 or more - 2 or  more  Comment - - - - -  Injury with Fall? Yes - No - No  Risk Factor Category  - - - - High Fall Risk  Risk for fall due to : - - - - -  Follow up - - - - -   Functional Status Survey:    Vitals:   11/13/18 0840  BP: 134/74  Pulse: (!) 59  Resp: 18  Temp: 98.8 F (37.1 C)  TempSrc: Oral  SpO2: 98%  Weight: 166 lb 3.2 oz (75.4 kg)  Height: 5\' 9"  (1.753 m)  Pulse on exam was low 50s Body mass index is 24.54 kg/m. Physical Exam In general this is a pleasant male in no distress lying and appears comfortably in bed he is alert he is a phasic.  His skin is warm and dry.  Eyes visual acuity appears grossly intact he makes eye contact.  Oropharynx he did not really open his mouth very wide from what I could see however cannot really see any significant film on his tongue or significantly dry mucous membranes.  Heart is distant heart sounds from what I could ascertain and per radial pulse was regular rhythm bradycardic with a rate in the low 50s he does not have significant lower extremity edema.  His abdomen is soft nontender with active bowel sounds PEG tube is in place there is  dressing around the PEG tube.  GU he does have a catheter draining a pretty large amount of amber-colored urine.  Musculoskeletal does have left-sided hemiparesis with some movement of his right upper and lower extremities.  Neurologic as noted above he continues to be aphasic he is alert makes eye contact is able to communicate somewhat with head gestures.  Psych he continues to be pleasant appropriate full assessment would be difficult secondary to his aphasic status   Labs reviewed: Recent Labs    07/08/18 0555  07/09/18 0510  07/10/18 0540  07/31/18 0246 08/01/18 0612 10/09/18 1120  NA 150*   < > 141   < > 141   < > 154* 147* 140  K 4.2   < > 4.0   < > 4.3   < > 4.3 5.6* 4.8  CL 120*   < > 114*   < > 111   < > 126* 117* 102  CO2 23   < > 23   < > 23   < > 23 22 22   GLUCOSE 214*   < > 219*   < > 192*   < > 121* 98 106*  BUN 53*   < > 32*   < > 28*   < > 50* 32* 50*  CREATININE 1.30*   < > 1.23   < > 1.17   < > 1.36* 1.18 1.82*  CALCIUM 8.3*   < > 8.0*   < > 8.2*   < > 8.8* 8.6* 9.7  MG 2.5*  --  2.0  --  2.1  --   --   --   --    < > = values in this interval not displayed.   Recent Labs    01/23/18 05/22/18 07/27/18 1314  AST 29 29 43*  ALT 32 30 26  ALKPHOS 137* 132* 78  BILITOT  --   --  0.4  PROT  --   --  7.0  ALBUMIN  --   --  2.0*   Recent Labs    07/08/18 0555  07/27/18 1314 07/28/18 0316  07/31/18 1023 10/09/18 1120  WBC 10.9*   < > 11.4* 13.1* 13.1* 15.1*  NEUTROABS 6.0  --  10.1*  --   --  10.6*  HGB 14.6   < > 9.9* 10.7* 11.9* 14.2  HCT 46.0   < > 32.2* 33.7* 37.0* 45.6  MCV 90.9   < > 91.2 91.1 90.9 87.2  PLT 90*   < > 134* 142* 276 280   < > = values in this interval not displayed.   Lab Results  Component Value Date   TSH 2.56 10/25/2017   Lab Results  Component Value Date   HGBA1C 6.8 (H) 07/27/2018   Lab Results  Component Value Date   CHOL 96 10/25/2017   HDL 29 (A) 10/25/2017   LDLCALC 50 10/25/2017   TRIG 86 10/25/2017    CHOLHDL 3.2 10/24/2015    Significant Diagnostic Results in last 30 days:  Ir Replace G-tube Simple Wo Fluoro  Result Date: 10/22/2018 INDICATION: Dislodged gastrostomy tube. EXAM: Placement of new gastrostomy tube. MEDICATIONS: None ANESTHESIA/SEDATION: No sedation medication given. CONTRAST:  None. COMPLICATIONS: None immediate. PROCEDURE: Patency of the gastrostomy tube tract was maintained using a 14 french Red Robinson catheter. This was removed at the bedside and a new balloon retention 14 french gastrostomy tube was easily placed. Gastric contents were seen in the tube which confirms placement. IMPRESSION: Successful placement of 14 French balloon retention gastrostomy tube. Tube is ready for use. Read by: Gareth Eagle, PA-C Electronically Signed   By: Markus Daft M.D.   On: 10/22/2018 12:38    Assessment/Plan   #1 history of failure to thrive-he is under comfort care measures with hospice.  At this point he appears to be comfortable he does have pain medications including tramadol which was recently added appears to be helping he also has Biofreeze topically for his hands as well as Tylenol.---  He has gained some weight which is encouraging status with a history of dysphagia.  2.  History of CVA with dysphasia status post PEG tube again this appears to be relatively stable continue supportive care he is on a statin-aspirin has been held because of bleeding issues as noted above.  3.  History of type 2 diabetes this appears well controlled on no medications CBGs run largely in the lower 100s.  4.  History of agitation anxiety- as well as depression-se is on Lexapro- he is also been on Seroquel twice daily long-term and this appears to be helping--with no significant recent behaviors to my knowledge.  He also is on Depakote 125 mg nightly.  5 history of urinary retention he does have a Foley catheter in place he did go to the ER recently secondary to urinary retention- placement of the  catheter appeared to be effective-he also was diagnosed with a UTI and has completed antibiotic for that.  He continues on Flomax as well.  6.  History of gout he continues on allopurinol this appears to be in effective he has not had a gout flare in some time.  7.  Hypertension he is on atenolol recent blood pressures 118/68-134/74-138/62-122/65- this appears well controlled pulses have run from the low 50s I see 1 listed in the 90s but this is quite unusual baseline appears to be more in the 50s to 60s- secondary to fairly persistent bradycardia will  decrease the atenolol to once a day 25 mg and monitor hold for systolic blood pressure less than 105 or heart rate less than 60  #8  history of aspiration pneumonia this is been stable now for some time-he does have Robafen as needed as well as oxygen as needed   Of note nursing is noted at times the Flomax and the Depakote sprinkles will clog the PEG tube-will have them contact pharmacy about possible alternatives.     [id number]

## 2018-11-14 ENCOUNTER — Non-Acute Institutional Stay (SKILLED_NURSING_FACILITY): Payer: Medicare Other | Admitting: Adult Health

## 2018-11-14 ENCOUNTER — Encounter: Payer: Self-pay | Admitting: Adult Health

## 2018-11-14 DIAGNOSIS — R339 Retention of urine, unspecified: Secondary | ICD-10-CM | POA: Diagnosis not present

## 2018-11-14 NOTE — Progress Notes (Signed)
Location:  [facility] Room Number: 111/A Place of Service:  SNF (31) Provider:  Durenda Age, DNP, FNP-BC  Patient Care Team: [name], MD as PCP - General (Internal Medicine) [place] as Referring Physician (General Practice) Letta Pate Luanna Salk, MD as Consulting Physician (Physical Medicine and Rehabilitation) Rosalin Hawking, MD as Consulting Physician (Neurology)  Extended Emergency Contact Information Primary Emergency Contact: Ivan Anchors [place] Phone: [phone] Relation: Brother  Code Status: Full Code  Goals of care: Advanced Directive information Advanced Directives 11/14/2018  Does Patient Have a Medical Advance Directive? Yes  Type of Advance Directive (No Data)  Does patient want to make changes to medical advance directive? No - Patient declined  Copy of [place] in Chart? -  Would patient like information on creating a medical advance directive? No - Patient declined  Pre-existing out of facility DNR order (yellow form or pink MOST form) -     Chief Complaint  Patient presents with  . Acute Visit    Medication Management    HPI:  Pt is a 68 y.o. male seen today for medication management. He is currently on Flomax for urinary retention/BPH. However, Flomax clogs his peg tube. He gets his medications through his peg tube.    Past Medical History:  Diagnosis Date  . Diabetes mellitus without complication ([place])   . Dysarthria   . Dysphagia   . GI bleed   . Hyperlipidemia   . Hypertension   . Renal insufficiency 09/22/2016  . Stroke [facility])    Past Surgical History:  Procedure Laterality Date  . ESOPHAGOGASTRODUODENOSCOPY (EGD) WITH PROPOFOL N/A 11/03/2015   Procedure: ESOPHAGOGASTRODUODENOSCOPY (EGD) WITH PROPOFOL;  Surgeon: Manus Gunning, MD;  Location: [place];  Service: Gastroenterology;  Laterality: N/A;  . IR REPLACE G-TUBE SIMPLE WO FLUORO   10/22/2018  . IR REPLC GASTRO/COLONIC TUBE PERCUT W/FLUORO  12/29/2016  . IR REPLC GASTRO/COLONIC TUBE PERCUT W/FLUORO  08/04/2018    Allergies  Allergen Reactions  . Scopolamine     Urinary retention    Outpatient Encounter Medications as of 11/14/2018  Medication Sig  . acetaminophen (TYLENOL) 325 MG tablet Place 650 mg into feeding tube. VIA TUBE 4 TIMES A DAY FOR PAIN  . allopurinol (ZYLOPRIM) 150 mg TABS tablet Place 150 mg into feeding tube daily.  Marland Kitchen amLODipine (NORVASC) 5 MG tablet Place 5 mg into feeding tube daily. TAKE 1 TABLET VIA TUBE ONCE DAILY   . atenolol (TENORMIN) 25 MG tablet Take 25 mg by mouth daily.   Marland Kitchen atorvastatin (LIPITOR) 80 MG tablet Place 80 mg into feeding tube at bedtime.   . Carboxymethylcellulose Sodium (REFRESH LIQUIGEL) 1 % GEL Place 1 drop into both eyes 3 (three) times daily.   . divalproex (DEPAKOTE) 125 MG DR tablet 125 mg at bedtime.   Marland Kitchen escitalopram (LEXAPRO) 5 MG tablet Take 5 mg by mouth daily. For Depression  . guaiFENesin (ROBAFEN MUCUS/CHEST CONGESTION) 100 MG/5ML liquid TAKE 10 ML VIA TUBE 4 TIMES A DAY FOR COUGH?CONGESTION  . Menthol, Topical Analgesic, (BIOFREEZE) 4 % GEL Apply thin layer to bilat hands for pain  . Nutritional Supplements (JEVITY PO) Place into feeding tube. DIABETA SOURCE AT 8O ML / HR VIA G/T CONTINUOUSLY FOR NUTRITION  . OXYGEN Inhale 2 L into the lungs See admin instructions. 2 liters of oxygen as needed for hypoxia and KEEP SAT AT >90%  . QUEtiapine (SEROQUEL) 25 MG tablet Place 25 mg  into feeding tube 2 (two) times daily.   . tamsulosin (FLOMAX) 0.4 MG CAPS capsule Take 1 capsule (0.4 mg total) by mouth daily.  . traMADol (ULTRAM) 50 MG tablet Take 50 mg by mouth every 12 (twelve) hours.  . traMADol (ULTRAM) 50 MG tablet Take 50 mg by mouth every 12 (twelve) hours as needed.  . Water For Irrigation, Sterile (FREE WATER) SOLN Place 300 mLs into feeding tube 4 (four) times daily.  . [DISCONTINUED] traMADol (ULTRAM) 50 MG  tablet Place 1 tablet (50 mg total) into feeding tube 2 (two) times daily. (Patient taking differently: Place 50 mg into feeding tube every 12 (twelve) hours. )   No facility-administered encounter medications on file as of 11/14/2018.     Review of Systems  Unable to obtain due to aphasia   Immunization History  Administered Date(s) Administered  . Influenza-Unspecified 06/21/2015, 03/01/2018  . PPD Test 11/08/2015  . Pneumococcal Conjugate-13 09/06/2017, 03/16/2018  . Pneumococcal-Unspecified 06/21/2015   Pertinent  Health Maintenance Due  Topic Date Due  . FOOT EXAM  12/13/2018 (Originally 11/03/2018)  . OPHTHALMOLOGY EXAM  12/13/2018 (Originally 10/06/2018)  . COLONOSCOPY  12/13/2018 (Originally 10/15/2000)  . INFLUENZA VACCINE  12/07/2018  . HEMOGLOBIN A1C  01/27/2019  . URINE MICROALBUMIN  07/27/2019  . PNA vac Low Risk Adult (2 of 2 - PPSV23) 06/20/2020   Fall Risk  01/03/2018 03/09/2017 01/02/2017 11/15/2016 10/13/2016  Falls in the past year? Yes Exclusion - non ambulatory Yes No Yes  Comment - - - - -  Number falls in past yr: 1 - 2 or more - 2 or more  Comment - - - - -  Injury with Fall? Yes - No - No  Risk Factor Category  - - - - High Fall Risk  Risk for fall due to : - - - - -  Follow up - - - - -     Vitals:   11/14/18 1536  BP: (!) 144/76  Pulse: (!) 56  Resp: 18  Temp: 98 F (36.7 C)  TempSrc: Oral  SpO2: 98%  Weight: 166 lb 3.2 oz (75.4 kg)  Height: 5\' 9"  (1.753 m)   Body mass index is 24.54 kg/m.  Physical Exam  GENERAL APPEARANCE: Well nourished. In no acute distress. Normal body habitus SKIN:  Skin is warm and dry.  MOUTH and THROAT: Lips are without lesions. Oral mucosa is moist and without lesions.  RESPIRATORY: Breathing is even & unlabored, BS CTAB CARDIAC: RRR, no murmur,no extra heart sounds, no edema GI: +peg tube GU:  +foley cathether NEUROLOGICAL: Aphasic. Left-sided hemiparesis PSYCHIATRIC:  Affect and behavior are appropriate  Labs  reviewed: Recent Labs    07/08/18 0555  07/09/18 0510  07/10/18 0540  07/31/18 0246 08/01/18 0612 10/09/18 1120  NA 150*   < > 141   < > 141   < > 154* 147* 140  K 4.2   < > 4.0   < > 4.3   < > 4.3 5.6* 4.8  CL 120*   < > 114*   < > 111   < > 126* 117* 102  CO2 23   < > 23   < > 23   < > 23 22 22   GLUCOSE 214*   < > 219*   < > 192*   < > 121* 98 106*  BUN 53*   < > 32*   < > 28*   < > 50* 32* 50*  CREATININE 1.30*   < >  1.23   < > 1.17   < > 1.36* 1.18 1.82*  CALCIUM 8.3*   < > 8.0*   < > 8.2*   < > 8.8* 8.6* 9.7  MG 2.5*  --  2.0  --  2.1  --   --   --   --    < > = values in this interval not displayed.   Recent Labs    01/23/18 05/22/18 07/27/18 1314  AST 29 29 43*  ALT 32 30 26  ALKPHOS 137* 132* 78  BILITOT  --   --  0.4  PROT  --   --  7.0  ALBUMIN  --   --  2.0*   Recent Labs    07/08/18 0555  07/27/18 1314 07/28/18 0316 07/31/18 1023 10/09/18 1120  WBC 10.9*   < > 11.4* 13.1* 13.1* 15.1*  NEUTROABS 6.0  --  10.1*  --   --  10.6*  HGB 14.6   < > 9.9* 10.7* 11.9* 14.2  HCT 46.0   < > 32.2* 33.7* 37.0* 45.6  MCV 90.9   < > 91.2 91.1 90.9 87.2  PLT 90*   < > 134* 142* 276 280   < > = values in this interval not displayed.   Lab Results  Component Value Date   TSH 2.56 10/25/2017   Lab Results  Component Value Date   HGBA1C 6.8 (H) 07/27/2018   Lab Results  Component Value Date   CHOL 96 10/25/2017   HDL 29 (A) 10/25/2017   LDLCALC 50 10/25/2017   TRIG 86 10/25/2017   CHOLHDL 3.2 10/24/2015    Significant Diagnostic Results in last 30 days:  Ir Replace G-tube Simple Wo Fluoro  Result Date: 10/22/2018 INDICATION: Dislodged gastrostomy tube. EXAM: Placement of new gastrostomy tube. MEDICATIONS: None ANESTHESIA/SEDATION: No sedation medication given. CONTRAST:  None. COMPLICATIONS: None immediate. PROCEDURE: Patency of the gastrostomy tube tract was maintained using a 14 french Red Robinson catheter. This was removed at the bedside and a new balloon  retention 14 french gastrostomy tube was easily placed. Gastric contents were seen in the tube which confirms placement. IMPRESSION: Successful placement of 14 French balloon retention gastrostomy tube. Tube is ready for use. Read by: Gareth Eagle, PA-C Electronically Signed   By: Markus Daft M.D.   On: 10/22/2018 12:38    Assessment/Plan  1. Urinary retention - will change Flomax to DoXazosin 1 mg 1 tab via peg tube daily, monitor urine output   Family/ staff Communication:  Discussed plan of care with resident and charge nurse.  Labs/tests ordered:  None  Goals of care:   Long-term care   Durenda Age, DNP, FNP-BC [facility] [phone] (Monday-Friday 8:00 a.m. - 5:00 p.m.) [phone] (after hours)

## 2018-11-19 ENCOUNTER — Emergency Department (HOSPITAL_COMMUNITY)
Admission: EM | Admit: 2018-11-19 | Discharge: 2018-11-19 | Disposition: A | Discharge status: Home / Self Care | Payer: No Typology Code available for payment source | Attending: Emergency Medicine | Admitting: Emergency Medicine

## 2018-11-19 ENCOUNTER — Other Ambulatory Visit: Payer: Self-pay

## 2018-11-19 ENCOUNTER — Encounter (HOSPITAL_COMMUNITY): Payer: Self-pay

## 2018-11-19 ENCOUNTER — Encounter: Payer: Self-pay | Admitting: Adult Health

## 2018-11-19 ENCOUNTER — Non-Acute Institutional Stay (SKILLED_NURSING_FACILITY): Payer: Medicare Other | Admitting: Adult Health

## 2018-11-19 DIAGNOSIS — E119 Type 2 diabetes mellitus without complications: Secondary | ICD-10-CM | POA: Insufficient documentation

## 2018-11-19 DIAGNOSIS — M109 Gout, unspecified: Secondary | ICD-10-CM | POA: Diagnosis not present

## 2018-11-19 DIAGNOSIS — G459 Transient cerebral ischemic attack, unspecified: Secondary | ICD-10-CM | POA: Diagnosis not present

## 2018-11-19 DIAGNOSIS — Z8719 Personal history of other diseases of the digestive system: Secondary | ICD-10-CM | POA: Diagnosis not present

## 2018-11-19 DIAGNOSIS — R61 Generalized hyperhidrosis: Secondary | ICD-10-CM | POA: Diagnosis not present

## 2018-11-19 DIAGNOSIS — R339 Retention of urine, unspecified: Secondary | ICD-10-CM | POA: Insufficient documentation

## 2018-11-19 DIAGNOSIS — Z931 Gastrostomy status: Secondary | ICD-10-CM | POA: Insufficient documentation

## 2018-11-19 DIAGNOSIS — Z85828 Personal history of other malignant neoplasm of skin: Secondary | ICD-10-CM | POA: Diagnosis not present

## 2018-11-19 DIAGNOSIS — F323 Major depressive disorder, single episode, severe with psychotic features: Secondary | ICD-10-CM

## 2018-11-19 DIAGNOSIS — F419 Anxiety disorder, unspecified: Secondary | ICD-10-CM | POA: Diagnosis not present

## 2018-11-19 DIAGNOSIS — E782 Mixed hyperlipidemia: Secondary | ICD-10-CM

## 2018-11-19 DIAGNOSIS — I2510 Atherosclerotic heart disease of native coronary artery without angina pectoris: Secondary | ICD-10-CM | POA: Diagnosis not present

## 2018-11-19 DIAGNOSIS — R531 Weakness: Secondary | ICD-10-CM | POA: Diagnosis not present

## 2018-11-19 DIAGNOSIS — I10 Essential (primary) hypertension: Secondary | ICD-10-CM | POA: Insufficient documentation

## 2018-11-19 DIAGNOSIS — Z8673 Personal history of transient ischemic attack (TIA), and cerebral infarction without residual deficits: Secondary | ICD-10-CM | POA: Insufficient documentation

## 2018-11-19 DIAGNOSIS — Z79899 Other long term (current) drug therapy: Secondary | ICD-10-CM | POA: Insufficient documentation

## 2018-11-19 DIAGNOSIS — Z87891 Personal history of nicotine dependence: Secondary | ICD-10-CM | POA: Insufficient documentation

## 2018-11-19 DIAGNOSIS — R52 Pain, unspecified: Secondary | ICD-10-CM | POA: Diagnosis not present

## 2018-11-19 DIAGNOSIS — G8929 Other chronic pain: Secondary | ICD-10-CM

## 2018-11-19 LAB — CBC WITH DIFFERENTIAL/PLATELET
Abs Immature Granulocytes: 0.03 10*3/uL (ref 0.00–0.07)
Basophils Absolute: 0 10*3/uL (ref 0.0–0.1)
Basophils Relative: 0 %
Eosinophils Absolute: 0 10*3/uL (ref 0.0–0.5)
Eosinophils Relative: 0 %
HCT: 41.5 % (ref 39.0–52.0)
Hemoglobin: 13.4 g/dL (ref 13.0–17.0)
Immature Granulocytes: 0 %
Lymphocytes Relative: 9 %
Lymphs Abs: 1 10*3/uL (ref 0.7–4.0)
MCH: 26.8 pg (ref 26.0–34.0)
MCHC: 32.3 g/dL (ref 30.0–36.0)
MCV: 83 fL (ref 80.0–100.0)
Monocytes Absolute: 0.6 10*3/uL (ref 0.1–1.0)
Monocytes Relative: 5 %
Neutro Abs: 9.8 10*3/uL — ABNORMAL HIGH (ref 1.7–7.7)
Neutrophils Relative %: 86 %
Platelets: 157 10*3/uL (ref 150–400)
RBC: 5 MIL/uL (ref 4.22–5.81)
RDW: 15 % (ref 11.5–15.5)
WBC: 11.5 10*3/uL — ABNORMAL HIGH (ref 4.0–10.5)
nRBC: 0 % (ref 0.0–0.2)

## 2018-11-19 LAB — BASIC METABOLIC PANEL
Anion gap: 17 — ABNORMAL HIGH (ref 5–15)
BUN: 42 mg/dL — ABNORMAL HIGH (ref 8–23)
CO2: 20 mmol/L — ABNORMAL LOW (ref 22–32)
Calcium: 9.8 mg/dL (ref 8.9–10.3)
Chloride: 103 mmol/L (ref 98–111)
Creatinine, Ser: 1.66 mg/dL — ABNORMAL HIGH (ref 0.61–1.24)
GFR calc Af Amer: 48 mL/min — ABNORMAL LOW (ref >60)
GFR calc non Af Amer: 42 mL/min — ABNORMAL LOW (ref >60)
Glucose, Bld: 117 mg/dL — ABNORMAL HIGH (ref 70–99)
Potassium: 4.4 mmol/L (ref 3.5–5.1)
Sodium: 140 mmol/L (ref 135–145)

## 2018-11-19 LAB — URINALYSIS, ROUTINE W REFLEX MICROSCOPIC
Bilirubin Urine: NEGATIVE
Glucose, UA: NEGATIVE mg/dL
Hgb urine dipstick: NEGATIVE
Ketones, ur: NEGATIVE mg/dL
Nitrite: NEGATIVE
Protein, ur: >=300 mg/dL — AB
Specific Gravity, Urine: 1.017 (ref 1.005–1.030)
pH: 8 (ref 5.0–8.0)

## 2018-11-19 NOTE — ED Provider Notes (Signed)
[facility] EMERGENCY DEPARTMENT Provider Note   CSN: [id number] Arrival date & time: 11/19/18  0126     History   Chief Complaint Chief Complaint  Patient presents with  . Urinary Retention    HPI Sean Collins is a 68 y.o. male.     The history is provided by the EMS personnel and the nursing home.     Level 5 caveat: Expressive aphasia  68 year old male with history of diabetes, hyperlipidemia, hypertension, renal insufficiency, prior stroke with residual expressive aphasia, presenting to the ED with urinary retention.  Patient is from [facility], nursing staff there reported that patient has not had any urine output since at least 7 PM, possibly earlier.  He does have history of urinary retention in the past.  He does not generally have an indwelling Foley.  There was also some blood noted on his urethral meatus today.  He has not had any fevers.  Past Medical History:  Diagnosis Date  . Diabetes mellitus without complication ([place])   . Dysarthria   . Dysphagia   . GI bleed   . Hyperlipidemia   . Hypertension   . Renal insufficiency 09/22/2016  . Stroke [facility])     Patient Active Problem List   Diagnosis Date Noted  . Bradycardia 11/13/2018  . Normochromic anemia 09/11/2018  . Adult failure to thrive syndrome 08/06/2018  . Urinary retention 08/01/2018  . Hyperkalemia 07/27/2018  . Acute kidney injury ([place]) 07/27/2018  . Aphasia   . AKI (acute kidney injury) ([place]) 07/06/2018  . Neoplasm of skin of abdomen 04/23/2018  . Thrombocytopenia ([place]) 01/22/2018  . Renal insufficiency 09/22/2016  . Depressive psychosis ([place]) 09/13/2016  . Cervical myofascial pain syndrome 08/29/2016  . Cervical facet joint syndrome 08/29/2016  . Recurrent falls 07/11/2016  . HCAP (healthcare-associated pneumonia) 07/05/2016  . Major depressive disorder, recurrent episode ([place]) 04/17/2016  . Shoulder pain, left 02/07/2016  . Essential hypertension 12/08/2015  .  Late effects of CVA (cerebrovascular accident) 12/08/2015  . Cerebrovascular accident (CVA) due to thrombosis of right middle cerebral artery ([place])   . Slurred speech   . Malnutrition of moderate degree 11/04/2015  . Left hemiparesis ([place])   . Dysphagia   . Hyperlipidemia 11/16/2011  . Gout 11/16/2011  . History of stroke 11/16/2011  . CAD (coronary artery disease) 11/16/2011    Past Surgical History:  Procedure Laterality Date  . ESOPHAGOGASTRODUODENOSCOPY (EGD) WITH PROPOFOL N/A 11/03/2015   Procedure: ESOPHAGOGASTRODUODENOSCOPY (EGD) WITH PROPOFOL;  Surgeon: Manus Gunning, MD;  Location: [place];  Service: Gastroenterology;  Laterality: N/A;  . IR REPLACE G-TUBE SIMPLE WO FLUORO  10/22/2018  . IR REPLC GASTRO/COLONIC TUBE PERCUT W/FLUORO  12/29/2016  . IR REPLC GASTRO/COLONIC TUBE PERCUT W/FLUORO  08/04/2018        Home Medications    Prior to Admission medications   Medication Sig Start Date End Date Taking? Authorizing Provider  acetaminophen (TYLENOL) 325 MG tablet Place 650 mg into feeding tube. VIA TUBE 4 TIMES A DAY FOR PAIN    [provider]  allopurinol (ZYLOPRIM) 150 mg TABS tablet Place 150 mg into feeding tube daily.    [provider]  amLODipine (NORVASC) 5 MG tablet Place 5 mg into feeding tube daily. TAKE 1 TABLET VIA TUBE ONCE DAILY     [provider]  atenolol (TENORMIN) 25 MG tablet Take 25 mg by mouth daily.     [provider]  atorvastatin (LIPITOR) 80 MG tablet Place 80  mg into feeding tube at bedtime.     [provider]  Carboxymethylcellulose Sodium (REFRESH LIQUIGEL) 1 % GEL Place 1 drop into both eyes 3 (three) times daily.     [provider]  divalproex (DEPAKOTE) 125 MG DR tablet 125 mg at bedtime.     [provider]  escitalopram (LEXAPRO) 5 MG tablet Take 5 mg by mouth daily. For Depression    [provider]  guaiFENesin (ROBAFEN MUCUS/CHEST CONGESTION) 100  MG/5ML liquid TAKE 10 ML VIA TUBE 4 TIMES A DAY FOR COUGH?CONGESTION    [provider]  Menthol, Topical Analgesic, (BIOFREEZE) 4 % GEL Apply thin layer to bilat hands for pain    [provider]  Nutritional Supplements (JEVITY PO) Place into feeding tube. DIABETA SOURCE AT 8O ML / HR VIA G/T CONTINUOUSLY FOR NUTRITION    [provider]  OXYGEN Inhale 2 L into the lungs See admin instructions. 2 liters of oxygen as needed for hypoxia and KEEP SAT AT >90%    [provider]  QUEtiapine (SEROQUEL) 25 MG tablet Place 25 mg into feeding tube 2 (two) times daily.     [provider]  tamsulosin (FLOMAX) 0.4 MG CAPS capsule Take 1 capsule (0.4 mg total) by mouth daily. 08/02/18   Danford, Suann Larry, MD  traMADol (ULTRAM) 50 MG tablet Take 50 mg by mouth every 12 (twelve) hours.    [provider]  traMADol (ULTRAM) 50 MG tablet Take 50 mg by mouth every 12 (twelve) hours as needed.    [provider]  Water For Irrigation, Sterile (FREE WATER) SOLN Place 300 mLs into feeding tube 4 (four) times daily. 08/01/18   Danford, Suann Larry, MD    Family History Family History  Problem Relation Age of Onset  . Stroke Brother   . Diabetes Brother   . Stroke Brother   . Stroke Maternal Aunt     Social History Social History   Tobacco Use  . Smoking status: Former Smoker    Packs/day: 0.50    Years: 32.00    Pack years: 16.00    Types: Cigarettes  . Smokeless tobacco: Never Used  . Tobacco comment: Started at age 14 though quit for 10 years at one point  Substance Use Topics  . Alcohol use: No    Alcohol/week: 0.0 standard drinks  . Drug use: No     Allergies   Scopolamine   Review of Systems Review of Systems  Unable to perform ROS: Other     Physical Exam Updated Vital Signs BP 108/72   Pulse 80   Temp 97.6 F (36.4 C) (Oral)   Resp (!) 31   SpO2 95%   Physical Exam Vitals signs and nursing note reviewed.   Constitutional:      Appearance: He is well-developed. He is diaphoretic.     Comments: Appears uncomfortable  HENT:     Head: Normocephalic and atraumatic.  Eyes:     Conjunctiva/sclera: Conjunctivae normal.     Pupils: Pupils are equal, round, and reactive to light.  Neck:     Musculoskeletal: Normal range of motion.  Cardiovascular:     Rate and Rhythm: Normal rate and regular rhythm.     Heart sounds: Normal heart sounds.  Pulmonary:     Effort: Pulmonary effort is normal. Tachypnea present.     Breath sounds: Normal breath sounds.  Abdominal:     General: Bowel sounds are normal.  Palpations: Abdomen is soft.     Comments: Fullness over bladder, moans when this is palpated G-tube in place, no signs of infection, no leaking  Musculoskeletal: Normal range of motion.  Skin:    General: Skin is warm.  Neurological:     Mental Status: He is alert and oriented to person, place, and time.      ED Treatments / Results  Labs (all labs ordered are listed, but only abnormal results are displayed) Labs Reviewed  URINALYSIS, ROUTINE W REFLEX MICROSCOPIC - Abnormal; Notable for the following components:      Result Value   APPearance CLOUDY (*)    Protein, ur >=300 (*)    Leukocytes,Ua LARGE (*)    Bacteria, UA RARE (*)    All other components within normal limits  CBC WITH DIFFERENTIAL/PLATELET - Abnormal; Notable for the following components:   WBC 11.5 (*)    Neutro Abs 9.8 (*)    All other components within normal limits  BASIC METABOLIC PANEL - Abnormal; Notable for the following components:   CO2 20 (*)    Glucose, Bld 117 (*)    BUN 42 (*)    Creatinine, Ser 1.66 (*)    GFR calc non Af Amer 42 (*)    GFR calc Af Amer 48 (*)    Anion gap 17 (*)    All other components within normal limits  URINE CULTURE    EKG None  Radiology No results found.  Procedures Procedures (including critical care time)  Medications Ordered in ED Medications - No data to  display   Initial Impression / Assessment and Plan / ED Course  I have reviewed the triage vital signs and the nursing notes.  Pertinent labs & imaging results that were available during my care of the patient were reviewed by me and considered in my medical decision making (see chart for details).  68 year old male here with urinary retention.  He apparently has not had any urine output since last evening around 7 PM or possibly earlier.  He is tachypneic and diaphoretic on arrival.  He has not had any respiratory issues per staff, suspect this is related to pain.  He does have fullness over the bladder and screams out when this area is palpated.  Bladder scan with 400+.  Indwelling Foley was placed.  Awaiting labs and urinalysis.  Foley has drained out 500+ cc of urine.  Labs are overall reassuring.  He does have chronic renal insufficiency but this appears around his baseline.  His UA with blood but no signs of overt infection.  Culture has been sent.  Patient's vitals have returned to normal and he is resting comfortably.  He is no longer diaphoretic and abdomen is no longer tender.  I suspect his diaphoresis and tachypnea was secondary to pain.  Feel he stable for discharge home.  Will leave Foley in place for now.  Will need follow-up with urology.  Return here for any new/acute changes.  Final Clinical Impressions(s) / ED Diagnoses   Final diagnoses:  Urinary retention    ED Discharge Orders    None       Larene Pickett, PA-C 11/19/18 0353    Merrily Pew, MD 11/19/18 [phone]

## 2018-11-19 NOTE — Progress Notes (Signed)
Location:  [place] Room Number: 111/A Place of Service:  SNF (31) Provider:  Durenda Age, DNP, FNP-BC  Patient Care Team: [name], MD as PCP - General (Internal Medicine) [place] as Referring Physician (General Practice) Letta Pate Luanna Salk, MD as Consulting Physician (Physical Medicine and Rehabilitation) Rosalin Hawking, MD as Consulting Physician (Neurology)  Extended Emergency Contact Information Primary Emergency Contact: Ivan Anchors [place] Phone: [phone] Relation: Brother  Code Status:  Full Code  Goals of care: Advanced Directive information Advanced Directives 11/19/2018  Does Patient Have a Medical Advance Directive? Yes  Type of Advance Directive (No Data)  Does patient want to make changes to medical advance directive? No - Patient declined  Copy of [place] in Chart? -  Would patient like information on creating a medical advance directive? No - Patient declined  Pre-existing out of facility DNR order (yellow form or pink MOST form) -     Chief Complaint  Patient presents with  . Acute Visit    Follow Visit  form ER    HPI:  Pt is a 68 y.o. male seen today for an acute visit after being readmitted to [facility] from an ER visit due to urinary retention.  Nursing staff tried to change foley catheter but was not successful. Foley catheter was re-inserted in the ER and drained 500 cc + urine. UA has blood but has no overt signs of infection. He was then sent back to facility. He is being followed by hospice. He has PMH of CVA, aphasia, HTN, HLD, DM with renal insufficiency.     Past Medical History:  Diagnosis Date  . Diabetes mellitus without complication ([place])   . Dysarthria   . Dysphagia   . GI bleed   . Hyperlipidemia   . Hypertension   . Renal insufficiency 09/22/2016  . Stroke [facility])    Past Surgical History:  Procedure  Laterality Date  . ESOPHAGOGASTRODUODENOSCOPY (EGD) WITH PROPOFOL N/A 11/03/2015   Procedure: ESOPHAGOGASTRODUODENOSCOPY (EGD) WITH PROPOFOL;  Surgeon: Manus Gunning, MD;  Location: [place];  Service: Gastroenterology;  Laterality: N/A;  . IR REPLACE G-TUBE SIMPLE WO FLUORO  10/22/2018  . IR REPLC GASTRO/COLONIC TUBE PERCUT W/FLUORO  12/29/2016  . IR REPLC GASTRO/COLONIC TUBE PERCUT W/FLUORO  08/04/2018    Allergies  Allergen Reactions  . Scopolamine     Urinary retention    Outpatient Encounter Medications as of 11/19/2018  Medication Sig  . acetaminophen (TYLENOL) 325 MG tablet Place 650 mg into feeding tube 4 (four) times daily.   Marland Kitchen allopurinol (ZYLOPRIM) 150 mg TABS tablet Place 150 mg into feeding tube daily.  Marland Kitchen amLODipine (NORVASC) 5 MG tablet Place 5 mg into feeding tube daily. TAKE 1 TABLET VIA TUBE ONCE DAILY   . atenolol (TENORMIN) 25 MG tablet Take 25 mg by mouth daily.   Marland Kitchen atorvastatin (LIPITOR) 80 MG tablet Place 80 mg into feeding tube at bedtime.   . bisacodyl (DULCOLAX) 10 MG suppository Place 10 mg rectally as needed for moderate constipation.  . Carboxymethylcellulose Sodium (REFRESH LIQUIGEL) 1 % GEL Place 1 drop into both eyes 3 (three) times daily.   . divalproex (DEPAKOTE) 125 MG DR tablet 125 mg at bedtime. Via tube  . doxazosin (CARDURA) 1 MG tablet Place 1 mg into feeding tube daily.  Marland Kitchen escitalopram (LEXAPRO) 5 MG tablet Take 5 mg by mouth daily. For Depression  . famotidine (PEPCID) 40 MG/5ML  suspension Place 20 mg into feeding tube 2 (two) times daily.  Marland Kitchen guaiFENesin (ROBAFEN MUCUS/CHEST CONGESTION) 100 MG/5ML liquid Place 100 mg into feeding tube 4 (four) times daily.   Marland Kitchen LORazepam (ATIVAN) 0.5 MG tablet Take 0.5 mg by mouth every 6 (six) hours as needed for anxiety.  . magnesium hydroxide (MILK OF MAGNESIA) 400 MG/5ML suspension Take 30 mLs by mouth daily as needed for mild constipation.  . Menthol, Topical Analgesic, (BIOFREEZE) 4 % GEL Apply thin  layer to bilat hands for pain  . Nutritional Supplements (NUTRITIONAL SUPPLEMENT PO) Place 80 mL/hr into feeding tube continuous. Diabeta Source  . OXYGEN Inhale 2 L into the lungs See admin instructions. 2 liters of oxygen as needed for hypoxia and KEEP SAT AT >90%  . QUEtiapine (SEROQUEL) 25 MG tablet Place 25 mg into feeding tube 2 (two) times daily.   . Sodium Phosphates (RA SALINE ENEMA) 19-7 GM/118ML ENEM Place 1 each rectally as needed (for constipation).  . traMADol (ULTRAM) 50 MG tablet Take 50 mg by mouth every 12 (twelve) hours.  . traMADol (ULTRAM) 50 MG tablet Take 50 mg by mouth every 12 (twelve) hours as needed.  . Water For Irrigation, Sterile (FREE WATER) SOLN Place 300 mLs into feeding tube 4 (four) times daily.  . [DISCONTINUED] tamsulosin (FLOMAX) 0.4 MG CAPS capsule Take 1 capsule (0.4 mg total) by mouth daily. (Patient not taking: Reported on 11/19/2018)   No facility-administered encounter medications on file as of 11/19/2018.     Review of Systems  Unable to obtain due to aphasia    Immunization History  Administered Date(s) Administered  . Influenza-Unspecified 06/21/2015, 03/01/2018  . PPD Test 11/08/2015  . Pneumococcal Conjugate-13 09/06/2017, 03/16/2018  . Pneumococcal-Unspecified 06/21/2015   Pertinent  Health Maintenance Due  Topic Date Due  . FOOT EXAM  12/13/2018 (Originally 11/03/2018)  . OPHTHALMOLOGY EXAM  12/13/2018 (Originally 10/06/2018)  . COLONOSCOPY  12/13/2018 (Originally 10/15/2000)  . INFLUENZA VACCINE  12/07/2018  . HEMOGLOBIN A1C  01/27/2019  . PNA vac Low Risk Adult (2 of 2 - PPSV23) 06/20/2020   Fall Risk  01/03/2018 03/09/2017 01/02/2017 11/15/2016 10/13/2016  Falls in the past year? Yes Exclusion - non ambulatory Yes No Yes  Comment - - - - -  Number falls in past yr: 1 - 2 or more - 2 or more  Comment - - - - -  Injury with Fall? Yes - No - No  Risk Factor Category  - - - - High Fall Risk  Risk for fall due to : - - - - -  Follow up -  - - - -     Vitals:   11/19/18 0933  BP: (!) 150/86  Pulse: 68  Resp: 16  Temp: 98.2 F (36.8 C)  TempSrc: Oral  SpO2: 98%  Weight: 166 lb 3.2 oz (75.4 kg)  Height: 5\' 9"  (1.753 m)   Body mass index is 24.54 kg/m.  Physical Exam  GENERAL APPEARANCE: Well nourished. In no acute distress. Normal body habitus SKIN:  Skin is warm and dry.  MOUTH and THROAT: Lips are without lesions. Oral mucosa is moist and without lesions.  RESPIRATORY: Breathing is even & unlabored, BS CTAB CARDIAC: RRR, no murmur,no extra heart sounds, no edema GI: +peg tube NEUROLOGICAL: There is no tremor.Aphasic.  PSYCHIATRIC:  Affect and behavior are appropriate  Labs reviewed: Recent Labs    07/08/18 0555  07/09/18 0510  07/10/18 0540  08/01/18 0612 10/09/18 1120 11/19/18 0159  NA 150*   < > 141   < > 141   < > 147* 140 140  K 4.2   < > 4.0   < > 4.3   < > 5.6* 4.8 4.4  CL 120*   < > 114*   < > 111   < > 117* 102 103  CO2 23   < > 23   < > 23   < > 22 22 20*  GLUCOSE 214*   < > 219*   < > 192*   < > 98 106* 117*  BUN 53*   < > 32*   < > 28*   < > 32* 50* 42*  CREATININE 1.30*   < > 1.23   < > 1.17   < > 1.18 1.82* 1.66*  CALCIUM 8.3*   < > 8.0*   < > 8.2*   < > 8.6* 9.7 9.8  MG 2.5*  --  2.0  --  2.1  --   --   --   --    < > = values in this interval not displayed.   Recent Labs    01/23/18 05/22/18 07/27/18 1314  AST 29 29 43*  ALT 32 30 26  ALKPHOS 137* 132* 78  BILITOT  --   --  0.4  PROT  --   --  7.0  ALBUMIN  --   --  2.0*   Recent Labs    07/27/18 1314  07/31/18 1023 10/09/18 1120 11/19/18 0159  WBC 11.4*   < > 13.1* 15.1* 11.5*  NEUTROABS 10.1*  --   --  10.6* 9.8*  HGB 9.9*   < > 11.9* 14.2 13.4  HCT 32.2*   < > 37.0* 45.6 41.5  MCV 91.2   < > 90.9 87.2 83.0  PLT 134*   < > 276 280 157   < > = values in this interval not displayed.   Lab Results  Component Value Date   TSH 2.56 10/25/2017   Lab Results  Component Value Date   HGBA1C 6.8 (H) 07/27/2018    Lab Results  Component Value Date   CHOL 96 10/25/2017   HDL 29 (A) 10/25/2017   LDLCALC 50 10/25/2017   TRIG 86 10/25/2017   CHOLHDL 3.2 10/24/2015    Significant Diagnostic Results in last 30 days:  Ir Replace G-tube Simple Wo Fluoro  Result Date: 10/22/2018 INDICATION: Dislodged gastrostomy tube. EXAM: Placement of new gastrostomy tube. MEDICATIONS: None ANESTHESIA/SEDATION: No sedation medication given. CONTRAST:  None. COMPLICATIONS: None immediate. PROCEDURE: Patency of the gastrostomy tube tract was maintained using a 14 french Red Robinson catheter. This was removed at the bedside and a new balloon retention 14 french gastrostomy tube was easily placed. Gastric contents were seen in the tube which confirms placement. IMPRESSION: Successful placement of 14 French balloon retention gastrostomy tube. Tube is ready for use. Read by: Gareth Eagle, PA-C Electronically Signed   By: Markus Daft M.D.   On: 10/22/2018 12:38    Assessment/Plan  1. Urinary retention - foley catheter was re-inserted in the ER, continue Doxazosin 1 mg daily   2. Essential hypertension - stable, continue Atenolol 25 mg daily and Amlodipine 5 mg daily  3. History of GI bleed - no GI bleed signs, continue Pepcid 20 mg BID  4. Mixed hyperlipidemia Lab Results  Component Value Date   CHOL 96 10/25/2017   HDL 29 (A) 10/25/2017   LDLCALC 50 10/25/2017  TRIG 86 10/25/2017   CHOLHDL 3.2 10/24/2015  - continue Atorvastatin 80 mg daily  5. Gout, unspecified cause, unspecified chronicity, unspecified site - continue Allopurinol 150 mg daily  6. Anxiety - reported to have occasional screaming, continues to need Ativan 0.5 mg Q 6 hours PRN  7. Other chronic pain - pain is well-controlled, continue Tramadol 50 mg Q 12 hours and Q 12 hours PRN  8. Depressive psychosis ([place]) - mood is stable, continue Quetiapine 25 mg BID, Divalproex 125 mg Q HS and Escitalopram 5 mg daily     Family/ staff Communication:   Discussed plan of care with resident.  Labs/tests ordered:  None  Goals of care:  Long-term care/Hospice    Durenda Age, DNP, FNP-BC [place] [phone] (Monday-Friday 8:00 a.m. - 5:00 p.m.) [phone] (after hours)

## 2018-11-19 NOTE — ED Notes (Signed)
Patient verbalizes understanding of discharge instructions. Opportunity for questioning and answering were provided. atient discharged from ED. [facility] here to take patient back to facility

## 2018-11-19 NOTE — ED Notes (Signed)
Bladder scan noted 425 ml, provider nbotified.

## 2018-11-19 NOTE — Progress Notes (Signed)
Manufacturing engineer [facility]) Hospital Liaison note:  This is an active patient with [facility].    Please use [facility] for ambulance transport as [facility] is contracted with [facility] for all active [facility] patients.  Please call with any hospice related questions.  Thank you,  Farrel Gordon, RN, CCM [place] (listed on [place] under [facility]) [phone]

## 2018-11-19 NOTE — Discharge Instructions (Addendum)
Leave foley in place, monitor output.  Will need follow-up with urology, call for appt. Return here for any new/acute changes-- fever, no drainage in bag, etc.

## 2018-11-19 NOTE — ED Notes (Signed)
Report given to [facility]. Informed RN about foley and follow up care

## 2018-11-19 NOTE — ED Triage Notes (Signed)
[facility] ems reports Urinary retention, pt has history of stroke w/ expressive aphasia. Pt lurching and yelling. Expressing abdominal pain. Some blood noted in pt's briefs. Pt diaphoretic and tychepnic upon admin.

## 2018-11-20 LAB — URINE CULTURE: Culture: >=100000 — AB

## 2018-11-21 ENCOUNTER — Telehealth (HOSPITAL_COMMUNITY): Payer: Self-pay | Admitting: Pharmacist

## 2018-11-21 ENCOUNTER — Telehealth: Payer: Self-pay | Admitting: *Deleted

## 2018-11-21 NOTE — Progress Notes (Signed)
ED Antimicrobial Stewardship Positive Culture Follow Up   Sean Collins is an 68 y.o. male who presented to [facility] on (Not on file) with a chief complaint of No chief complaint on file.   Recent Results (from the past 720 hour(s))  Novel Coronavirus, NAA ([facility])     Status: None   Collection Time: 10/27/18 12:00 AM  Result Value Ref Range Status   SARS-CoV-2, NAA Negative  Final  Urine culture     Status: Abnormal   Collection Time: 11/19/18  1:59 AM   Specimen: Urine, Random  Result Value Ref Range Status   Specimen Description URINE, RANDOM  Final   Special Requests   Final    NONE Performed at [facility], [address], [place],  [place] [address]    Culture >=100,000 COLONIES/mL PROTEUS MIRABILIS (A)  Final   Report Status 11/20/2018 FINAL  Final   Organism ID, Bacteria PROTEUS MIRABILIS (A)  Final      Susceptibility   Proteus mirabilis - MIC*    AMPICILLIN <=2 SENSITIVE Sensitive     CEFAZOLIN <=4 SENSITIVE Sensitive     CEFTRIAXONE <=1 SENSITIVE Sensitive     CIPROFLOXACIN 1 SENSITIVE Sensitive     GENTAMICIN <=1 SENSITIVE Sensitive     IMIPENEM 2 SENSITIVE Sensitive     NITROFURANTOIN RESISTANT Resistant     TRIMETH/SULFA <=20 SENSITIVE Sensitive     AMPICILLIN/SULBACTAM <=2 SENSITIVE Sensitive     PIP/TAZO <=4 SENSITIVE Sensitive     * >=100,000 COLONIES/mL PROTEUS MIRABILIS    []  Treated with , organism resistant to prescribed antimicrobial []  Patient discharged originally without antimicrobial agent and treatment is now indicated  New antibiotic prescription: Please do a symptom check. If he has no urinary symptoms, no antibiotics. If he is symptomatic, please prescribe Keflex 500 mg po BID x 7 days.  ED Provider: [place]. [place] 11/21/2018, 9:22 AM Clinical Pharmacist Monday - Friday phone -  [phone] Saturday - Sunday phone - [phone]

## 2018-11-21 NOTE — Telephone Encounter (Signed)
Post ED Visit - Positive Culture Follow-up  Culture report reviewed by antimicrobial stewardship pharmacist: [place] Team []  Elenor Quinones, Pharm.D. []  Heide Guile, Pharm.D., BCPS AQ-ID []  Parks Neptune, Pharm.D., BCPS []  Alycia Rossetti, Pharm.D., BCPS []  [place], Pharm.D., BCPS, AAHIVP []  Legrand Como, Pharm.D., BCPS, AAHIVP []  Salome Arnt, PharmD, BCPS []  Johnnette Gourd, PharmD, BCPS [x]  Hughes Better, PharmD, BCPS []  Leeroy Cha, PharmD []  Laqueta Linden, PharmD, BCPS []  Albertina Parr, PharmD  [place] Team []  Leodis Sias, PharmD []  Lindell Spar, PharmD []  Royetta Asal, PharmD []  Graylin Shiver, Rph []  Rema Fendt) Glennon Mac, PharmD []  Arlyn Dunning, PharmD []  Netta Cedars, PharmD []  Dia Sitter, PharmD []  Leone Haven, PharmD []  Gretta Arab, PharmD []  Theodis Shove, PharmD []  Peggyann Juba, PharmD []  Reuel Boom, PharmD   Positive  Urine culture Spoke with Daleen Snook at [facility] who states patient is asymptomatic and feeling much better. No further patient follow-up is required at this time.  Harlon Flor [facility] 11/21/2018, 11:19 AM

## 2018-11-23 DIAGNOSIS — F419 Anxiety disorder, unspecified: Secondary | ICD-10-CM | POA: Insufficient documentation

## 2018-11-23 DIAGNOSIS — G8929 Other chronic pain: Secondary | ICD-10-CM | POA: Insufficient documentation

## 2018-11-23 DIAGNOSIS — Z8719 Personal history of other diseases of the digestive system: Secondary | ICD-10-CM | POA: Insufficient documentation

## 2018-11-28 ENCOUNTER — Encounter: Payer: Self-pay | Admitting: Adult Health

## 2018-11-28 ENCOUNTER — Non-Acute Institutional Stay (SKILLED_NURSING_FACILITY): Payer: Medicare Other | Admitting: Adult Health

## 2018-11-28 DIAGNOSIS — F419 Anxiety disorder, unspecified: Secondary | ICD-10-CM

## 2018-11-28 NOTE — Progress Notes (Signed)
Location:  [place] Room Number: 08/01/2018 Place of Service:  SNF (31) Provider:  Durenda Age, DNP, FNP-BC  Patient Care Team: [name], MD as PCP - General (Internal Medicine) [place] as Referring Physician (General Practice) Letta Pate Luanna Salk, MD as Consulting Physician (Physical Medicine and Rehabilitation) Rosalin Hawking, MD as Consulting Physician (Neurology)  Extended Emergency Contact Information Primary Emergency Contact: Ivan Anchors [place] Phone: [phone] Relation: Brother  Code Status:  Full Code  Goals of care: Advanced Directive information Advanced Directives 11/28/2018  Does Patient Have a Medical Advance Directive? Yes  Type of Advance Directive (No Data)  Does patient want to make changes to medical advance directive? No - Patient declined  Copy of [place] in Chart? -  Would patient like information on creating a medical advance directive? No - Patient declined  Pre-existing out of facility DNR order (yellow form or pink MOST form) -     Chief Complaint  Patient presents with  . Acute Visit    Anxiety    HPI:  Pt is a 68 y.o. male seen today for an acute visit regarding anxiety. He was reported to groan at times. He was seen in his room today. He is aphasic. When asked if he is in pain, he turns his face sidewards. He has PRN Ativan and was reported to be getting it at least twice a day. He is followed by hospice and was recently transferred to the hospital for urinary retention. He has foley catheter with yellowish urine draining to bag.   Past Medical History:  Diagnosis Date  . Diabetes mellitus without complication ([place])   . Dysarthria   . Dysphagia   . GI bleed   . Hyperlipidemia   . Hypertension   . Renal insufficiency 09/22/2016  . Stroke [facility])    Past Surgical History:  Procedure Laterality Date  . ESOPHAGOGASTRODUODENOSCOPY (EGD) WITH  PROPOFOL N/A 11/03/2015   Procedure: ESOPHAGOGASTRODUODENOSCOPY (EGD) WITH PROPOFOL;  Surgeon: Manus Gunning, MD;  Location: [place];  Service: Gastroenterology;  Laterality: N/A;  . IR REPLACE G-TUBE SIMPLE WO FLUORO  10/22/2018  . IR REPLC GASTRO/COLONIC TUBE PERCUT W/FLUORO  12/29/2016  . IR REPLC GASTRO/COLONIC TUBE PERCUT W/FLUORO  08/04/2018    Allergies  Allergen Reactions  . Scopolamine     Urinary retention    Outpatient Encounter Medications as of 11/28/2018  Medication Sig  . acetaminophen (TYLENOL) 325 MG tablet Place 650 mg into feeding tube 4 (four) times daily.   Marland Kitchen allopurinol (ZYLOPRIM) 150 mg TABS tablet Place 150 mg into feeding tube daily.  Marland Kitchen amLODipine (NORVASC) 5 MG tablet Place 5 mg into feeding tube daily. TAKE 1 TABLET VIA TUBE ONCE DAILY   . atenolol (TENORMIN) 25 MG tablet Take 25 mg by mouth daily.   Marland Kitchen atorvastatin (LIPITOR) 80 MG tablet Place 80 mg into feeding tube at bedtime.   . bisacodyl (DULCOLAX) 10 MG suppository Place 10 mg rectally as needed for moderate constipation.  . Carboxymethylcellulose Sodium (REFRESH LIQUIGEL) 1 % GEL Place 1 drop into both eyes 3 (three) times daily.   . divalproex (DEPAKOTE) 125 MG DR tablet 125 mg at bedtime. Via tube  . doxazosin (CARDURA) 1 MG tablet Place 1 mg into feeding tube daily.  Marland Kitchen escitalopram (LEXAPRO) 5 MG tablet Take 5 mg by mouth daily. For Depression  . famotidine (PEPCID) 40 MG/5ML suspension Place 20 mg into feeding tube 2 (two) times  daily.  . guaiFENesin (ROBAFEN MUCUS/CHEST CONGESTION) 100 MG/5ML liquid Place 100 mg into feeding tube 4 (four) times daily.   Marland Kitchen LORazepam (ATIVAN) 0.5 MG tablet Take 0.5 mg by mouth every 6 (six) hours as needed for anxiety.  . magnesium hydroxide (MILK OF MAGNESIA) 400 MG/5ML suspension Take 30 mLs by mouth daily as needed for mild constipation.  . Menthol, Topical Analgesic, (BIOFREEZE) 4 % GEL Apply thin layer to bilat hands for pain  . Nutritional Supplements  (NUTRITIONAL SUPPLEMENT PO) Place 80 mL/hr into feeding tube continuous. Diabeta Source  . OXYGEN Inhale 2 L into the lungs See admin instructions. 2 liters of oxygen as needed for hypoxia and KEEP SAT AT >90%  . QUEtiapine (SEROQUEL) 25 MG tablet Place 25 mg into feeding tube 2 (two) times daily.   . Sodium Phosphates (RA SALINE ENEMA) 19-7 GM/118ML ENEM Place 1 each rectally as needed (for constipation).  . traMADol (ULTRAM) 50 MG tablet Take 50 mg by mouth every 12 (twelve) hours.  . traMADol (ULTRAM) 50 MG tablet Take 50 mg by mouth every 12 (twelve) hours as needed.  . Water For Irrigation, Sterile (FREE WATER) SOLN Place 300 mLs into feeding tube 4 (four) times daily.   No facility-administered encounter medications on file as of 11/28/2018.     Review of Systems  Unable to obtain due to aphasia    Immunization History  Administered Date(s) Administered  . Influenza-Unspecified 06/21/2015, 03/01/2018  . PPD Test 11/08/2015  . Pneumococcal Conjugate-13 09/06/2017, 03/16/2018  . Pneumococcal-Unspecified 06/21/2015   Pertinent  Health Maintenance Due  Topic Date Due  . FOOT EXAM  12/13/2018 (Originally 11/03/2018)  . OPHTHALMOLOGY EXAM  12/13/2018 (Originally 10/06/2018)  . COLONOSCOPY  12/13/2018 (Originally 10/15/2000)  . INFLUENZA VACCINE  12/07/2018  . HEMOGLOBIN A1C  01/27/2019  . PNA vac Low Risk Adult (2 of 2 - PPSV23) 06/20/2020   Fall Risk  01/03/2018 03/09/2017 01/02/2017 11/15/2016 10/13/2016  Falls in the past year? Yes Exclusion - non ambulatory Yes No Yes  Comment - - - - -  Number falls in past yr: 1 - 2 or more - 2 or more  Comment - - - - -  Injury with Fall? Yes - No - No  Risk Factor Category  - - - - High Fall Risk  Risk for fall due to : - - - - -  Follow up - - - - -     Vitals:   11/28/18 1137  BP: (!) 142/72  Pulse: 68  Resp: 18  Temp: (!) 97.3 F (36.3 C)  TempSrc: Oral  SpO2: 97%  Weight: 166 lb 3.2 oz (75.4 kg)  Height: 5\' 9"  (1.753 m)   Body  mass index is 24.54 kg/m.  Physical Exam  GENERAL APPEARANCE: Well nourished. In no acute distress. Normal body habitus SKIN:  Skin is warm and dry.  MOUTH and THROAT: Lips are without lesions. Oral mucosa is moist and without lesions. RESPIRATORY: Breathing is even & unlabored, BS CTAB CARDIAC: RRR, no murmur,no extra heart sounds, no edema GI: Has peg tube GU: Has foley catheter NEUROLOGICAL: Aphasic, left hemiplegia PSYCHIATRIC:  Affect and behavior are appropriate  Labs reviewed: Recent Labs    07/08/18 0555  07/09/18 0510  07/10/18 0540  08/01/18 0612 10/09/18 1120 11/19/18 0159  NA 150*   < > 141   < > 141   < > 147* 140 140  K 4.2   < > 4.0   < >  4.3   < > 5.6* 4.8 4.4  CL 120*   < > 114*   < > 111   < > 117* 102 103  CO2 23   < > 23   < > 23   < > 22 22 20*  GLUCOSE 214*   < > 219*   < > 192*   < > 98 106* 117*  BUN 53*   < > 32*   < > 28*   < > 32* 50* 42*  CREATININE 1.30*   < > 1.23   < > 1.17   < > 1.18 1.82* 1.66*  CALCIUM 8.3*   < > 8.0*   < > 8.2*   < > 8.6* 9.7 9.8  MG 2.5*  --  2.0  --  2.1  --   --   --   --    < > = values in this interval not displayed.   Recent Labs    01/23/18 05/22/18 07/27/18 1314  AST 29 29 43*  ALT 32 30 26  ALKPHOS 137* 132* 78  BILITOT  --   --  0.4  PROT  --   --  7.0  ALBUMIN  --   --  2.0*   Recent Labs    07/27/18 1314  07/31/18 1023 10/09/18 1120 11/19/18 0159  WBC 11.4*   < > 13.1* 15.1* 11.5*  NEUTROABS 10.1*  --   --  10.6* 9.8*  HGB 9.9*   < > 11.9* 14.2 13.4  HCT 32.2*   < > 37.0* 45.6 41.5  MCV 91.2   < > 90.9 87.2 83.0  PLT 134*   < > 276 280 157   < > = values in this interval not displayed.   Lab Results  Component Value Date   TSH 2.56 10/25/2017   Lab Results  Component Value Date   HGBA1C 6.8 (H) 07/27/2018   Lab Results  Component Value Date   CHOL 96 10/25/2017   HDL 29 (A) 10/25/2017   LDLCALC 50 10/25/2017   TRIG 86 10/25/2017   CHOLHDL 3.2 10/24/2015     Assessment/Plan   1. Anxiety - will discontinue Ativan Q 6 hours PRN, start Ativan 0.5 mg BID and BID PRN X 14 days, hospice nurse made aware of orders    Family/ staff Communication: Discussed plan of care with resident and charge nurse.  Labs/tests ordered:  None  Goals of care:   Long-term care   Durenda Age, DNP, FNP-BC [facility] and Adult Medicine [phone] (Monday-Friday 8:00 a.m. - 5:00 p.m.) [phone] (after hours)

## 2018-11-29 ENCOUNTER — Other Ambulatory Visit: Payer: Self-pay | Admitting: Adult Health

## 2018-11-29 DIAGNOSIS — Z515 Encounter for palliative care: Secondary | ICD-10-CM

## 2018-11-29 MED ORDER — TRAMADOL HCL 50 MG PO TABS: 50.0000 mg | ORAL_TABLET | Freq: Two times a day (BID) | ORAL | 0 refills | Status: DC

## 2018-11-29 MED ORDER — TRAMADOL HCL 50 MG PO TABS: 50.0000 mg | ORAL_TABLET | Freq: Two times a day (BID) | ORAL | 0 refills | Status: DC | PRN

## 2018-12-07 DIAGNOSIS — Z931 Gastrostomy status: Secondary | ICD-10-CM | POA: Diagnosis not present

## 2018-12-07 DIAGNOSIS — N400 Enlarged prostate without lower urinary tract symptoms: Secondary | ICD-10-CM | POA: Diagnosis not present

## 2018-12-07 DIAGNOSIS — R1310 Dysphagia, unspecified: Secondary | ICD-10-CM | POA: Diagnosis not present

## 2018-12-07 DIAGNOSIS — Z8719 Personal history of other diseases of the digestive system: Secondary | ICD-10-CM | POA: Diagnosis not present

## 2018-12-07 DIAGNOSIS — N184 Chronic kidney disease, stage 4 (severe): Secondary | ICD-10-CM | POA: Diagnosis not present

## 2018-12-07 DIAGNOSIS — J9610 Chronic respiratory failure, unspecified whether with hypoxia or hypercapnia: Secondary | ICD-10-CM | POA: Diagnosis not present

## 2018-12-07 DIAGNOSIS — E43 Unspecified severe protein-calorie malnutrition: Secondary | ICD-10-CM | POA: Diagnosis not present

## 2018-12-07 DIAGNOSIS — E1122 Type 2 diabetes mellitus with diabetic chronic kidney disease: Secondary | ICD-10-CM | POA: Diagnosis not present

## 2018-12-07 DIAGNOSIS — I129 Hypertensive chronic kidney disease with stage 1 through stage 4 chronic kidney disease, or unspecified chronic kidney disease: Secondary | ICD-10-CM | POA: Diagnosis not present

## 2018-12-07 DIAGNOSIS — F39 Unspecified mood [affective] disorder: Secondary | ICD-10-CM | POA: Diagnosis not present

## 2018-12-07 DIAGNOSIS — I69354 Hemiplegia and hemiparesis following cerebral infarction affecting left non-dominant side: Secondary | ICD-10-CM | POA: Diagnosis not present

## 2018-12-07 DIAGNOSIS — R159 Full incontinence of feces: Secondary | ICD-10-CM | POA: Diagnosis not present

## 2018-12-07 DIAGNOSIS — Z7401 Bed confinement status: Secondary | ICD-10-CM | POA: Diagnosis not present

## 2018-12-07 DIAGNOSIS — Z960 Presence of urogenital implants: Secondary | ICD-10-CM | POA: Diagnosis not present

## 2018-12-09 ENCOUNTER — Other Ambulatory Visit: Payer: Self-pay | Admitting: Adult Health

## 2018-12-09 DIAGNOSIS — E1122 Type 2 diabetes mellitus with diabetic chronic kidney disease: Secondary | ICD-10-CM | POA: Diagnosis not present

## 2018-12-09 DIAGNOSIS — I69354 Hemiplegia and hemiparesis following cerebral infarction affecting left non-dominant side: Secondary | ICD-10-CM | POA: Diagnosis not present

## 2018-12-09 DIAGNOSIS — E43 Unspecified severe protein-calorie malnutrition: Secondary | ICD-10-CM | POA: Diagnosis not present

## 2018-12-09 DIAGNOSIS — R1310 Dysphagia, unspecified: Secondary | ICD-10-CM | POA: Diagnosis not present

## 2018-12-09 DIAGNOSIS — J9610 Chronic respiratory failure, unspecified whether with hypoxia or hypercapnia: Secondary | ICD-10-CM | POA: Diagnosis not present

## 2018-12-09 DIAGNOSIS — I129 Hypertensive chronic kidney disease with stage 1 through stage 4 chronic kidney disease, or unspecified chronic kidney disease: Secondary | ICD-10-CM | POA: Diagnosis not present

## 2018-12-09 MED ORDER — LORAZEPAM 0.5 MG PO TABS: 0.5000 mg | ORAL_TABLET | Freq: Two times a day (BID) | ORAL | 0 refills | Status: AC | PRN

## 2018-12-09 MED ORDER — LORAZEPAM 0.5 MG PO TABS: 0.5000 mg | ORAL_TABLET | Freq: Two times a day (BID) | ORAL | 0 refills | Status: DC

## 2018-12-10 ENCOUNTER — Encounter: Payer: Self-pay | Admitting: Internal Medicine

## 2018-12-10 ENCOUNTER — Non-Acute Institutional Stay (SKILLED_NURSING_FACILITY): Payer: Medicare Other | Admitting: Internal Medicine

## 2018-12-10 DIAGNOSIS — R339 Retention of urine, unspecified: Secondary | ICD-10-CM | POA: Diagnosis not present

## 2018-12-10 DIAGNOSIS — G4731 Primary central sleep apnea: Secondary | ICD-10-CM | POA: Diagnosis not present

## 2018-12-10 DIAGNOSIS — E1121 Type 2 diabetes mellitus with diabetic nephropathy: Secondary | ICD-10-CM

## 2018-12-10 DIAGNOSIS — N289 Disorder of kidney and ureter, unspecified: Secondary | ICD-10-CM

## 2018-12-10 DIAGNOSIS — I10 Essential (primary) hypertension: Secondary | ICD-10-CM | POA: Diagnosis not present

## 2018-12-10 NOTE — Progress Notes (Signed)
   NURSING HOME LOCATION:  [facility] ROOM NUMBER:  111-A  CODE STATUS:  Full Code  PCP:  [name], MD  [place] [address]   This is a nursing facility follow up of chronic medical diagnoses.  Interim medical record and care since last [place] visit was updated with review of diagnostic studies and change in clinical status since last visit were documented.  HPI: He is a permanent resident of facility with medical diagnoses of history of stroke with dysphagia and dysarthria, renal insufficiency, essential hypertension, dyslipidemia, history of GI bleed, and diabetes with vascular complications. He has had his G-tube replaced on several occasions. He was seen in the ED 3/28 after his PEG tube was pulled out. He was seen in the ED 10/09/2018 for urinary retention associated with suprapubic discomfort. He was seen in the ED most recently 6/15 after the G-tube again became dislodged. Most recent ED visit was 7/14 again for urinary retention.  Proteus mirabilis UTI was documented. After the ED assessments the patient was quarantined because of the [place] pandemic.  Screening was negative prompting transfer to regular floor.  Family history is strongly positive for stroke as would be expected. He has at least a 16-pack-year history of smoking.  Review of systems: Could not be completed as patient slept through the exam.  He intermittently exhibited hyponia as well as apnea followed by loud snoring. Fasting glucoses have ranged from 100 up to 147.  The majority are in the 120s.  Physical exam:  Pertinent or positive findings: He appears surprisingly well nourished.  Pattern alopecia is present.  He has a beard and mustache.  Teeth are plaque coated.  Heart sounds are distant and slightly irregular.  Breath sounds are decreased and as noted he exhibits hypopnea, apnea, and loud snoring. G tube & Foley present. Pedal pulses are decreased.  He has  interosseous wasting of the hands  General appearance:  no acute distress, increased work of breathing is present.   Lymphatic: No lymphadenopathy about the head, neck, axilla. Eyes: No conjunctival inflammation or lid edema is present. There is no scleral icterus. Ears:  External ear exam shows no significant lesions or deformities.   Nose:  External nasal examination shows no deformity or inflammation. Nasal mucosa are pink and moist without lesions, exudates Oral exam: gums are healthy appearing.  Neck:  No thyromegaly, masses, tenderness noted.    Heart:  No gallop, murmur, click, rub .  Lungs: without wheezes, rhonchi, rales, rubs. Abdomen: Bowel sounds are normal. Abdomen is soft and nontender with no organomegaly, hernias, masses. GU: Deferred  Extremities:  No cyanosis, clubbing, edema  Neurologic exam : Balance, Rhomberg, finger to nose testing could not be completed due to clinical state Skin: Warm & dry w/o tenting. No significant lesions or rash.  See summary under each active problem in the Problem List with associated updated therapeutic plan

## 2018-12-10 NOTE — Assessment & Plan Note (Addendum)
Patient clinically cannot tolerate CPAP.  Monitor for hypoxia with as needed nasal oxygen.  Serially O2 sats are within normal limits.

## 2018-12-10 NOTE — Assessment & Plan Note (Signed)
11/19/2018 creatinine 1.66 and GFR 48.  These values were improved from June renal function studies.

## 2018-12-10 NOTE — Assessment & Plan Note (Signed)
Blood pressure low today; if trend continues; antihypertensive medications will be weaned or even deprescribed.

## 2018-12-10 NOTE — Assessment & Plan Note (Addendum)
Most recent episode of urinary retention was in the context of Proteus mirabilis UTI. Verify whether on Flomax as previously noted (07/30/2018) Consider adding finasteride. [facility] referral if retention recurs

## 2018-12-10 NOTE — Patient Instructions (Signed)
See assessment and plan under each diagnosis in the problem list and acutely for this visit 

## 2018-12-10 NOTE — Assessment & Plan Note (Addendum)
Fasting blood glucoses range from 100-147; the majority are in the 120s.  No evidence of uncontrolled diabetes.

## 2018-12-18 DIAGNOSIS — I69354 Hemiplegia and hemiparesis following cerebral infarction affecting left non-dominant side: Secondary | ICD-10-CM | POA: Diagnosis not present

## 2018-12-18 DIAGNOSIS — E1122 Type 2 diabetes mellitus with diabetic chronic kidney disease: Secondary | ICD-10-CM | POA: Diagnosis not present

## 2018-12-18 DIAGNOSIS — R1310 Dysphagia, unspecified: Secondary | ICD-10-CM | POA: Diagnosis not present

## 2018-12-18 DIAGNOSIS — I129 Hypertensive chronic kidney disease with stage 1 through stage 4 chronic kidney disease, or unspecified chronic kidney disease: Secondary | ICD-10-CM | POA: Diagnosis not present

## 2018-12-18 DIAGNOSIS — J9610 Chronic respiratory failure, unspecified whether with hypoxia or hypercapnia: Secondary | ICD-10-CM | POA: Diagnosis not present

## 2018-12-18 DIAGNOSIS — E43 Unspecified severe protein-calorie malnutrition: Secondary | ICD-10-CM | POA: Diagnosis not present

## 2018-12-30 ENCOUNTER — Other Ambulatory Visit: Payer: Self-pay | Admitting: Adult Health

## 2018-12-30 ENCOUNTER — Encounter: Payer: Self-pay | Admitting: Adult Health

## 2018-12-30 MED ORDER — TRAMADOL HCL 50 MG PO TABS: 50.0000 mg | ORAL_TABLET | Freq: Two times a day (BID) | ORAL | 0 refills | Status: DC | PRN

## 2018-12-30 MED ORDER — TRAMADOL HCL 50 MG PO TABS: 50.0000 mg | ORAL_TABLET | Freq: Two times a day (BID) | ORAL | 0 refills | Status: DC

## 2018-12-30 NOTE — Progress Notes (Signed)
This encounter was created in error - please disregard.

## 2019-01-01 ENCOUNTER — Non-Acute Institutional Stay (SKILLED_NURSING_FACILITY): Payer: Medicare Other | Admitting: Adult Health

## 2019-01-01 ENCOUNTER — Encounter: Payer: Self-pay | Admitting: Adult Health

## 2019-01-01 DIAGNOSIS — F419 Anxiety disorder, unspecified: Secondary | ICD-10-CM | POA: Diagnosis not present

## 2019-01-01 DIAGNOSIS — F339 Major depressive disorder, recurrent, unspecified: Secondary | ICD-10-CM | POA: Diagnosis not present

## 2019-01-01 DIAGNOSIS — E782 Mixed hyperlipidemia: Secondary | ICD-10-CM

## 2019-01-01 DIAGNOSIS — E1121 Type 2 diabetes mellitus with diabetic nephropathy: Secondary | ICD-10-CM

## 2019-01-01 DIAGNOSIS — I10 Essential (primary) hypertension: Secondary | ICD-10-CM | POA: Diagnosis not present

## 2019-01-01 DIAGNOSIS — R339 Retention of urine, unspecified: Secondary | ICD-10-CM

## 2019-01-01 DIAGNOSIS — G8929 Other chronic pain: Secondary | ICD-10-CM | POA: Diagnosis not present

## 2019-01-01 DIAGNOSIS — R1310 Dysphagia, unspecified: Secondary | ICD-10-CM | POA: Diagnosis not present

## 2019-01-01 DIAGNOSIS — Z8719 Personal history of other diseases of the digestive system: Secondary | ICD-10-CM | POA: Diagnosis not present

## 2019-01-01 NOTE — Progress Notes (Signed)
Location:  [facility] Room Number: 111/A Place of Service:  SNF (31) Provider:  Durenda Age, DNP, FNP-BC  Patient Care Team: [name], MD as PCP - General (Internal Medicine) [place] as Referring Physician (General Practice) Letta Pate Luanna Salk, MD as Consulting Physician (Physical Medicine and Rehabilitation) Rosalin Hawking, MD as Consulting Physician (Neurology)  Extended Emergency Contact Information Primary Emergency Contact: Ivan An[place] Phone: [phone] Relation: Brother  Code Status:  Full Code  Goals of care: Advanced Directive information Advanced Directives 01/01/2019  Does Patient Have a Medical Advance Directive? Yes  Type of Advance Directive (No Data)  Does patient want to make changes to medical advance directive? No - Patient declined  Copy of [place] in Chart? -  Would patient like information on creating a medical advance directive? No - Patient declined  Pre-existing out of facility DNR order (yellow form or pink MOST form) -     Chief Complaint  Patient presents with  . Medical Management of Chronic Issues    Routine Visit    HPI:  Pt is a 68 y.o. [place] seen today for medical management of chronic diseases. He has PMH of aphasia, hypertension, hyperlipidemia and diabetes mellitus with renal insufficiency. He was seen in his room today. He is aphasic but able to make gestures. He turns his head sideways when asked if he has any pain. He is on Tramadol for pain. He has foley catheter draining to urine bag with clear yellowish urine. He takes Doxazosin for BPH. BPs ranging from 120/70 to 149/72. He takes Amlodipine for hypertension. He is needing total care with ADLs. He is followed by hospice. There are reports of occasional agitation and groaning. He takes Quetiapine and Ativan for Mood disorder with psychosis and anxiety.   Past Medical History:   Diagnosis Date  . Diabetes mellitus without complication ([place])   . Dysarthria   . Dysphagia   . GI bleed   . Hyperlipidemia   . Hypertension   . Renal insufficiency 09/22/2016  . Stroke [facility])    Past Surgical History:  Procedure Laterality Date  . ESOPHAGOGASTRODUODENOSCOPY (EGD) WITH PROPOFOL N/A 11/03/2015   Procedure: ESOPHAGOGASTRODUODENOSCOPY (EGD) WITH PROPOFOL;  Surgeon: Manus Gunning, MD;  Location: [facility];  Service: Gastroenterology;  Laterality: N/A;  . IR REPLACE G-TUBE SIMPLE WO FLUORO  10/22/2018  . IR REPLC GASTRO/COLONIC TUBE PERCUT W/FLUORO  12/29/2016  . IR REPLC GASTRO/COLONIC TUBE PERCUT W/FLUORO  08/04/2018    Allergies  Allergen Reactions  . Scopolamine     Urinary retention    Outpatient Encounter Medications as of 01/01/2019  Medication Sig  . acetaminophen (TYLENOL) 325 MG tablet Place 650 mg into feeding tube 4 (four) times daily.   Marland Kitchen allopurinol (ZYLOPRIM) 150 mg TABS tablet Place 150 mg into feeding tube daily.  Marland Kitchen amLODipine (NORVASC) 5 MG tablet Place 5 mg into feeding tube daily. TAKE 1 TABLET VIA TUBE ONCE DAILY   . atenolol (TENORMIN) 25 MG tablet Take 25 mg by mouth daily.   Marland Kitchen atorvastatin (LIPITOR) 80 MG tablet Place 80 mg into feeding tube at bedtime.   . bisacodyl (DULCOLAX) 10 MG suppository Place 10 mg rectally as needed for moderate constipation.  . Carboxymethylcellulose Sodium (REFRESH LIQUIGEL) 1 % GEL Place 1 drop into both eyes 3 (three) times daily.   . divalproex (DEPAKOTE) 125 MG DR tablet 125 mg at bedtime. Via tube  . doxazosin (CARDURA)  1 MG tablet Place 1 mg into feeding tube daily.   Marland Kitchen escitalopram (LEXAPRO) 5 MG tablet Take 5 mg by mouth daily. For Depression  . famotidine (PEPCID) 40 MG/5ML suspension Place 20 mg into feeding tube 2 (two) times daily.  Marland Kitchen guaiFENesin (ROBAFEN MUCUS/CHEST CONGESTION) 100 MG/5ML liquid Place 100 mg into feeding tube 4 (four) times daily.   Marland Kitchen LORazepam (ATIVAN) 0.5 MG tablet Take 0.5 mg  by mouth 2 (two) times daily. For anxiety  . magnesium hydroxide (MILK OF MAGNESIA) 400 MG/5ML suspension Take 30 mLs by mouth daily as needed for mild constipation.  . Menthol, Topical Analgesic, (BIOFREEZE) 4 % GEL Apply thin layer to bilat hands for pain  . Nutritional Supplements (NUTRITIONAL SUPPLEMENT PO) Place 80 mL/hr into feeding tube continuous. Diabeta Source  . OXYGEN Inhale 2 L into the lungs See admin instructions. 2 liters of oxygen as needed for hypoxia and KEEP SAT AT >90%  . QUEtiapine (SEROQUEL) 25 MG tablet Place 25 mg into feeding tube 2 (two) times daily.   . Sodium Phosphates (RA SALINE ENEMA) 19-7 GM/118ML ENEM Place 1 each rectally as needed (for constipation).  . traMADol (ULTRAM) 50 MG tablet Take 1 tablet (50 mg total) by mouth every 12 (twelve) hours.  . traMADol (ULTRAM) 50 MG tablet Take 1 tablet (50 mg total) by mouth every 12 (twelve) hours as needed.  . Water For Irrigation, Sterile (FREE WATER) SOLN Place 300 mLs into feeding tube 4 (four) times daily.   No facility-administered encounter medications on file as of 01/01/2019.     Review of Systems  Unable to obtain due to aphasia    Immunization History  Administered Date(s) Administered  . Influenza-Unspecified 06/21/2015, 03/01/2018  . PPD Test 11/08/2015  . Pneumococcal Conjugate-13 09/06/2017, 03/16/2018  . Pneumococcal-Unspecified 06/21/2015  . Tdap 11/14/2018   Pertinent  Health Maintenance Due  Topic Date Due  . INFLUENZA VACCINE  12/07/2018  . FOOT EXAM  02/01/2019 (Originally 11/03/2018)  . OPHTHALMOLOGY EXAM  02/01/2019 (Originally 10/06/2018)  . COLONOSCOPY  02/01/2019 (Originally 10/15/2000)  . HEMOGLOBIN A1C  01/27/2019  . PNA vac Low Risk Adult (2 of 2 - PPSV23) 06/20/2020   Fall Risk  01/03/2018 03/09/2017 01/02/2017 11/15/2016 10/13/2016  Falls in the past year? Yes Exclusion - non ambulatory Yes No Yes  Comment - - - - -  Number falls in past yr: 1 - 2 or more - 2 or more  Comment - - -  - -  Injury with Fall? Yes - No - No  Risk Factor Category  - - - - High Fall Risk  Risk for fall due to : - - - - -  Follow up - - - - -     Vitals:   01/01/19 1232  BP: 131/78  Pulse: (!) 52  Resp: 19  Temp: 98.6 F (Sean C)  TempSrc: Oral  SpO2: 98%  Weight: 163 lb 12.8 oz (74.3 kg)  Height: 5\' 9"  (1.753 m)   Body mass index is 24.19 kg/m.  Physical Exam  GENERAL APPEARANCE: Well nourished. In no acute distress. Normal body habitus SKIN:  Skin is warm and dry.  MOUTH and THROAT: Lips are without lesions. Oral mucosa is moist and without lesions.  RESPIRATORY: Breathing is even & unlabored, BS CTAB CARDIAC: RRR, no murmur,no extra heart sounds, no edema GI: Abdomen soft, normal BS, no masses, no tenderness, +peg tube  GU: + foley catheter NEUROLOGICAL: There is no tremor. Aphasic. PSYCHIATRIC:  Affect and behavior are appropriate  Labs reviewed: Recent Labs    07/08/18 0555  07/09/18 0510  07/10/18 0540  08/01/18 0612 10/09/18 1120 11/19/18 0159  NA 150*   < > 141   < > 141   < > 147* 140 140  K 4.2   < > 4.0   < > 4.3   < > 5.6* 4.8 4.4  CL 120*   < > 114*   < > 111   < > 117* 102 103  CO2 23   < > 23   < > 23   < > 22 22 20*  GLUCOSE 214*   < > 219*   < > 192*   < > 98 106* 117*  BUN 53*   < > 32*   < > 28*   < > 32* 50* 42*  CREATININE 1.30*   < > 1.23   < > 1.17   < > 1.18 1.82* 1.66*  CALCIUM 8.3*   < > 8.0*   < > 8.2*   < > 8.6* 9.7 9.8  MG 2.5*  --  2.0  --  2.1  --   --   --   --    < > = values in this interval not displayed.   Recent Labs    01/23/18 05/22/18 07/27/18 1314  AST 29 29 43*  ALT 32 30 26  ALKPHOS 137* 132* 78  BILITOT  --   --  0.4  PROT  --   --  7.0  ALBUMIN  --   --  2.0*   Recent Labs    07/27/18 1314  07/31/18 1023 10/09/18 1120 11/19/18 0159  WBC 11.4*   < > 13.1* 15.1* 11.5*  NEUTROABS 10.1*  --   --  10.6* 9.8*  HGB 9.9*   < > 11.9* 14.2 13.4  HCT 32.2*   < > Sean.0* 45.6 41.5  MCV 91.2   < > 90.9 87.2 83.0  PLT  134*   < > 276 280 157   < > = values in this interval not displayed.   Lab Results  Component Value Date   TSH 2.56 10/25/2017   Lab Results  Component Value Date   HGBA1C 6.8 (H) 07/27/2018   Lab Results  Component Value Date   CHOL 96 10/25/2017   HDL 29 (A) 10/25/2017   LDLCALC 50 10/25/2017   TRIG 86 10/25/2017   CHOLHDL 3.2 10/24/2015     Assessment/Plan  1. History of GI bleed - no bleeding noted, continue Pepcid  2. Essential hypertension -Stable, continue amlodipine and atenolol  3. Urinary retention -Has chronic Foley catheter, continue doxazosin  4. Type 2 diabetes mellitus with diabetic nephropathy, without long-term current use of insulin (HCC) Lab Results  Component Value Date   HGBA1C 6.8 (H) 07/27/2018  -Diet controlled  5. Mixed hyperlipidemia Lab Results  Component Value Date   CHOL 96 10/25/2017   HDL 29 (A) 10/25/2017   LDLCALC 50 10/25/2017   TRIG 86 10/25/2017   CHOLHDL 3.2 10/24/2015  -Continue atorvastatin  6. Dysphagia, unspecified type -Continue tube feedings with DiaBeta source  7. Episode of recurrent major depressive disorder, unspecified depression episode severity ([place]) -Stable, continue escitalopram  8. Other chronic pain -Patient only moans and groans, continue tramadol  9. Anxiety  -Has occasional anxiety, continue Ativan routinely and PRN , followed up by psych NP   Family/ staff Communication:  Discussed plan of care with resident  and charge nurse.  Labs/tests ordered:  None  Goals of care:  Long-term care/hospice care   Durenda Age, DNP, FNP-BC [facility] and Adult Medicine [phone] (Monday-Friday 8:00 a.m. - 5:00 p.m.) [phone] (after hours)

## 2019-01-09 ENCOUNTER — Non-Acute Institutional Stay (SKILLED_NURSING_FACILITY): Payer: Medicare Other | Admitting: Adult Health

## 2019-01-09 ENCOUNTER — Encounter: Payer: Self-pay | Admitting: Adult Health

## 2019-01-09 DIAGNOSIS — F39 Unspecified mood [affective] disorder: Secondary | ICD-10-CM

## 2019-01-09 DIAGNOSIS — F419 Anxiety disorder, unspecified: Secondary | ICD-10-CM | POA: Diagnosis not present

## 2019-01-09 DIAGNOSIS — F339 Major depressive disorder, recurrent, unspecified: Secondary | ICD-10-CM | POA: Diagnosis not present

## 2019-01-09 MED ORDER — LORAZEPAM 0.5 MG PO TABS: 0.5000 mg | ORAL_TABLET | Freq: Two times a day (BID) | ORAL | 0 refills | Status: AC | PRN

## 2019-01-09 MED ORDER — LORAZEPAM 0.5 MG PO TABS: 0.5000 mg | ORAL_TABLET | Freq: Two times a day (BID) | ORAL | 0 refills | Status: DC

## 2019-01-09 NOTE — Progress Notes (Signed)
Location:  [facility] Room Number: 111/A Place of Service:  SNF (31) Provider:  Durenda Age, DNP, FNP-BC  Patient Care Team: [name], MD as PCP - General (Internal Medicine) [place] as Referring Physician (General Practice) Letta Pate Luanna Salk, MD as Consulting Physician (Physical Medicine and Rehabilitation) Rosalin Hawking, MD as Consulting Physician (Neurology)  Extended Emergency Contact Information Primary Emergency Contact: [place] Phone: [phone] Relation: Brother  Code Status:  Full Code  Goals of care: Advanced Directive information Advanced Directives 01/09/2019  Does Patient Have a Medical Advance Directive? Yes  Type of Advance Directive (No Data)  Does patient want to make changes to medical advance directive? No - Patient declined  Copy of [place] in Chart? -  Would patient like information on creating a medical advance directive? No - Patient declined  Pre-existing out of facility DNR order (yellow form or pink MOST form) -     Chief Complaint  Patient presents with  . Acute Visit    Anxiety    HPI:  Pt is a 68 y.o. male seen today for an acute visit regarding his anxiety. He has history of  CVA and has aphasia, peg tube feedings and left hemiplegia. He is being followed by hospice. Charge nurse reported that he would sometimes would attempt to fall out of bed when he gets frustrated. He had a fall incident yesterday, 01/08/19. He was found on the floor next to the bed. His order for Ativan PRN has expired and currently on Ativan 0.5 mg BID only. He was seen in the room today. He was asleep and woke up to verbal greetings.   Past Medical History:  Diagnosis Date  . Diabetes mellitus without complication ([place])   . Dysarthria   . Dysphagia   . GI bleed   . Hyperlipidemia   . Hypertension   . Renal insufficiency 09/22/2016  . Stroke [facility])    Past  Surgical History:  Procedure Laterality Date  . ESOPHAGOGASTRODUODENOSCOPY (EGD) WITH PROPOFOL N/A 11/03/2015   Procedure: ESOPHAGOGASTRODUODENOSCOPY (EGD) WITH PROPOFOL;  Surgeon: Manus Gunning, MD;  Location: [facility];  Service: Gastroenterology;  Laterality: N/A;  . IR REPLACE G-TUBE SIMPLE WO FLUORO  10/22/2018  . IR REPLC GASTRO/COLONIC TUBE PERCUT W/FLUORO  12/29/2016  . IR REPLC GASTRO/COLONIC TUBE PERCUT W/FLUORO  08/04/2018    Allergies  Allergen Reactions  . Scopolamine     Urinary retention    Outpatient Encounter Medications as of 01/09/2019  Medication Sig  . acetaminophen (TYLENOL) 325 MG tablet Place 650 mg into feeding tube 4 (four) times daily.   Marland Kitchen allopurinol (ZYLOPRIM) 150 mg TABS tablet Place 150 mg into feeding tube daily.  Marland Kitchen amLODipine (NORVASC) 5 MG tablet Place 5 mg into feeding tube daily. TAKE 1 TABLET VIA TUBE ONCE DAILY   . atenolol (TENORMIN) 25 MG tablet Take 25 mg by mouth daily.   Marland Kitchen atorvastatin (LIPITOR) 80 MG tablet Place 80 mg into feeding tube at bedtime.   . bisacodyl (DULCOLAX) 10 MG suppository Place 10 mg rectally as needed for moderate constipation.  . Carboxymethylcellulose Sodium (REFRESH LIQUIGEL) 1 % GEL Place 1 drop into both eyes 3 (three) times daily.   . divalproex (DEPAKOTE) 125 MG DR tablet 125 mg at bedtime. Via tube  . doxazosin (CARDURA) 1 MG tablet Place 1 mg into feeding tube daily.   Marland Kitchen escitalopram (LEXAPRO) 5 MG tablet Take 5 mg by mouth  daily. For Depression  . famotidine (PEPCID) 40 MG/5ML suspension Place 20 mg into feeding tube 2 (two) times daily.  Marland Kitchen guaiFENesin (ROBAFEN MUCUS/CHEST CONGESTION) 100 MG/5ML liquid Place 100 mg into feeding tube 4 (four) times daily.   Marland Kitchen LORazepam (ATIVAN) 0.5 MG tablet Place 1 tablet (0.5 mg total) into feeding tube 2 (two) times daily for 30 doses. For anxiety  . magnesium hydroxide (MILK OF MAGNESIA) 400 MG/5ML suspension Take 30 mLs by mouth daily as needed for mild constipation.  .  Menthol, Topical Analgesic, (BIOFREEZE) 4 % GEL Apply thin layer to bilat hands for pain  . Nutritional Supplements (NUTRITIONAL SUPPLEMENT PO) Place 80 mL/hr into feeding tube continuous. Diabeta Source  . OXYGEN Inhale 2 L into the lungs See admin instructions. 2 liters of oxygen as needed for hypoxia and KEEP SAT AT >90%  . QUEtiapine (SEROQUEL) 25 MG tablet Place 25 mg into feeding tube 2 (two) times daily.   . Sodium Phosphates (RA SALINE ENEMA) 19-7 GM/118ML ENEM Place 1 each rectally as needed (for constipation).  . traMADol (ULTRAM) 50 MG tablet Take 1 tablet (50 mg total) by mouth every 12 (twelve) hours.  . traMADol (ULTRAM) 50 MG tablet Take 1 tablet (50 mg total) by mouth every 12 (twelve) hours as needed.  . Water For Irrigation, Sterile (FREE WATER) SOLN Place 300 mLs into feeding tube 4 (four) times daily.  . [DISCONTINUED] LORazepam (ATIVAN) 0.5 MG tablet Take 0.5 mg by mouth 2 (two) times daily. For anxiety  . LORazepam (ATIVAN) 0.5 MG tablet Place 1 tablet (0.5 mg total) into feeding tube 2 (two) times daily as needed for up to 14 days for anxiety.   No facility-administered encounter medications on file as of 01/09/2019.     Review of Systems Unable to obtain due to aphasia   Immunization History  Administered Date(s) Administered  . Influenza-Unspecified 06/21/2015, 03/01/2018  . PPD Test 11/08/2015  . Pneumococcal Conjugate-13 09/06/2017, 03/16/2018  . Pneumococcal-Unspecified 06/21/2015  . Tdap 11/14/2018   Pertinent  Health Maintenance Due  Topic Date Due  . OPHTHALMOLOGY EXAM  02/01/2019 (Originally 10/06/2018)  . COLONOSCOPY  02/01/2019 (Originally 10/15/2000)  . INFLUENZA VACCINE  02/08/2019 (Originally 12/07/2018)  . HEMOGLOBIN A1C  01/27/2019  . FOOT EXAM  05/18/2019  . PNA vac Low Risk Adult (2 of 2 - PPSV23) 06/20/2020   Fall Risk  01/03/2018 03/09/2017 01/02/2017 11/15/2016 10/13/2016  Falls in the past year? Yes Exclusion - non ambulatory Yes No Yes  Comment -  - - - -  Number falls in past yr: 1 - 2 or more - 2 or more  Comment - - - - -  Injury with Fall? Yes - No - No  Risk Factor Category  - - - - High Fall Risk  Risk for fall due to : - - - - -  Follow up - - - - -     Vitals:   01/09/19 1411  BP: 127/69  Pulse: 69  Resp: 20  Temp: 98.5 F (36.9 C)  TempSrc: Oral  SpO2: 98%  Weight: 163 lb 12.8 oz (74.3 kg)  Height: 5\' 9"  (1.753 m)   Body mass index is 24.19 kg/m.  Physical Exam  GENERAL APPEARANCE: Well nourished. In no acute distress. Normal body habitus SKIN:  Skin is warm and dry.  MOUTH and THROAT: Lips are without lesions. Oral mucosa is moist and without lesions.  RESPIRATORY: Breathing is even & unlabored, BS CTAB CARDIAC: RRR, no murmur,no  extra heart sounds, no edema GI: Abdomen soft, normal BS, no masses, no tenderness, +peg tube NEUROLOGICAL: There is no tremor. Aphasic. Left hemiplegia.Marland Kitchen  PSYCHIATRIC:  Affect and behavior are appropriate  Labs reviewed: Recent Labs    07/08/18 0555  07/09/18 0510  07/10/18 0540  08/01/18 0612 10/09/18 1120 11/19/18 0159  NA 150*   < > 141   < > 141   < > 147* 140 140  K 4.2   < > 4.0   < > 4.3   < > 5.6* 4.8 4.4  CL 120*   < > 114*   < > 111   < > 117* 102 103  CO2 23   < > 23   < > 23   < > 22 22 20*  GLUCOSE 214*   < > 219*   < > 192*   < > 98 106* 117*  BUN 53*   < > 32*   < > 28*   < > 32* 50* 42*  CREATININE 1.30*   < > 1.23   < > 1.17   < > 1.18 1.82* 1.66*  CALCIUM 8.3*   < > 8.0*   < > 8.2*   < > 8.6* 9.7 9.8  MG 2.5*  --  2.0  --  2.1  --   --   --   --    < > = values in this interval not displayed.   Recent Labs    01/23/18 05/22/18 07/27/18 1314  AST 29 29 43*  ALT 32 30 26  ALKPHOS 137* 132* 78  BILITOT  --   --  0.4  PROT  --   --  7.0  ALBUMIN  --   --  2.0*   Recent Labs    07/27/18 1314  07/31/18 1023 10/09/18 1120 11/19/18 0159  WBC 11.4*   < > 13.1* 15.1* 11.5*  NEUTROABS 10.1*  --   --  10.6* 9.8*  HGB 9.9*   < > 11.9* 14.2 13.4   HCT 32.2*   < > 37.0* 45.6 41.5  MCV 91.2   < > 90.9 87.2 83.0  PLT 134*   < > 276 280 157   < > = values in this interval not displayed.   Lab Results  Component Value Date   TSH 2.56 10/25/2017   Lab Results  Component Value Date   HGBA1C 6.8 (H) 07/27/2018   Lab Results  Component Value Date   CHOL 96 10/25/2017   HDL 29 (A) 10/25/2017   LDLCALC 50 10/25/2017   TRIG 86 10/25/2017   CHOLHDL 3.2 10/24/2015      Assessment/Plan  1. Anxiety - will continue Ativan 0.25 mg BID and re-start Aivan 0.5 mg BID PRN X 14 days, follow up with psych NP, monitor behavior  2. Mood disorder ([place]) - continue Divalproex and Quetiapine  3. Episode of recurrent major depressive disorder, unspecified depression episode severity ([place]) - continue Escitalopram   Family/ staff Communication:  Discussed plan of care with resident and charge nurse.  Labs/tests ordered:  None  Goals of care:  Long-term care/Hospice care     Durenda Age, DNP, FNP-BC [facility] and Adult Medicine [phone] (Monday-Friday 8:00 a.m. - 5:00 p.m.) [phone] (after hours)

## 2019-01-23 ENCOUNTER — Other Ambulatory Visit: Payer: Self-pay | Admitting: Internal Medicine

## 2019-01-23 DIAGNOSIS — I69354 Hemiplegia and hemiparesis following cerebral infarction affecting left non-dominant side: Secondary | ICD-10-CM | POA: Diagnosis not present

## 2019-01-23 DIAGNOSIS — R293 Abnormal posture: Secondary | ICD-10-CM | POA: Diagnosis not present

## 2019-01-23 DIAGNOSIS — R41841 Cognitive communication deficit: Secondary | ICD-10-CM | POA: Diagnosis not present

## 2019-01-23 DIAGNOSIS — M6281 Muscle weakness (generalized): Secondary | ICD-10-CM | POA: Diagnosis not present

## 2019-01-23 DIAGNOSIS — R296 Repeated falls: Secondary | ICD-10-CM | POA: Diagnosis not present

## 2019-01-23 MED ORDER — LORAZEPAM 0.5 MG PO TABS: 0.5000 mg | ORAL_TABLET | Freq: Two times a day (BID) | ORAL | 0 refills | Status: DC

## 2019-01-24 DIAGNOSIS — M6281 Muscle weakness (generalized): Secondary | ICD-10-CM | POA: Diagnosis not present

## 2019-01-24 DIAGNOSIS — R41841 Cognitive communication deficit: Secondary | ICD-10-CM | POA: Diagnosis not present

## 2019-01-24 DIAGNOSIS — I69354 Hemiplegia and hemiparesis following cerebral infarction affecting left non-dominant side: Secondary | ICD-10-CM | POA: Diagnosis not present

## 2019-01-24 DIAGNOSIS — R296 Repeated falls: Secondary | ICD-10-CM | POA: Diagnosis not present

## 2019-01-24 DIAGNOSIS — R293 Abnormal posture: Secondary | ICD-10-CM | POA: Diagnosis not present

## 2019-01-27 ENCOUNTER — Non-Acute Institutional Stay (SKILLED_NURSING_FACILITY): Payer: Medicare Other | Admitting: Adult Health

## 2019-01-27 ENCOUNTER — Encounter: Payer: Self-pay | Admitting: Adult Health

## 2019-01-27 DIAGNOSIS — F419 Anxiety disorder, unspecified: Secondary | ICD-10-CM | POA: Diagnosis not present

## 2019-01-27 DIAGNOSIS — I10 Essential (primary) hypertension: Secondary | ICD-10-CM | POA: Diagnosis not present

## 2019-01-27 DIAGNOSIS — F339 Major depressive disorder, recurrent, unspecified: Secondary | ICD-10-CM

## 2019-01-27 DIAGNOSIS — M6281 Muscle weakness (generalized): Secondary | ICD-10-CM | POA: Diagnosis not present

## 2019-01-27 DIAGNOSIS — R293 Abnormal posture: Secondary | ICD-10-CM | POA: Diagnosis not present

## 2019-01-27 DIAGNOSIS — I69354 Hemiplegia and hemiparesis following cerebral infarction affecting left non-dominant side: Secondary | ICD-10-CM | POA: Diagnosis not present

## 2019-01-27 DIAGNOSIS — R41841 Cognitive communication deficit: Secondary | ICD-10-CM | POA: Diagnosis not present

## 2019-01-27 DIAGNOSIS — R296 Repeated falls: Secondary | ICD-10-CM | POA: Diagnosis not present

## 2019-01-27 DIAGNOSIS — N400 Enlarged prostate without lower urinary tract symptoms: Secondary | ICD-10-CM | POA: Diagnosis not present

## 2019-01-27 NOTE — Progress Notes (Signed)
Location:  [facility] Room Number: 116/A Place of Service:  SNF (31) Provider:  Durenda Age, DNP, FNP-BC  Patient Care Team: [name], MD as PCP - General (Internal Medicine) [place] as Referring Physician (General Practice) Letta Pate Luanna Salk, MD as Consulting Physician (Physical Medicine and Rehabilitation) Rosalin Hawking, MD as Consulting Physician (Neurology)  Extended Emergency Contact Information Primary Emergency Contact: Ivan Anchors [place] of [place] Phone: [phone] Relation: Brother  Code Status:  Full Code Goals of care: Advanced Directive information Advanced Directives 01/27/2019  Does Patient Have a Medical Advance Directive? Yes  Type of Advance Directive (No Data)  Does patient want to make changes to medical advance directive? No - Patient declined  Copy of [place] in Chart? -  Would patient like information on creating a medical advance directive? No - Patient declined  Pre-existing out of facility DNR order (yellow form or pink MOST form) -     Chief Complaint  Patient presents with  . Medical Management of Chronic Issues    Routine visit of medical management    HPI:  Pt is a 68 y.o. [place] seen today for medical management of chronic diseases.  He has PMH of CVA, aphasia, peg tube feedings and left hemiplegia. HIs HCPOA, brother, has declined hospice care. He had had falls and now on scoop mattress and transferred to room 116 which is closer to nurses station. He was seen in the room today. He followed simple verbal commands. No agitation nor anxiety noted.  BPs 138/74, 142/73, 147/76, 146/74.  He currently takes atenolol and amlodipine for hypertension.   Past Medical History:  Diagnosis Date  . Diabetes mellitus without complication ([place])   . Dysarthria   . Dysphagia   . GI bleed   . Hyperlipidemia   . Hypertension   . Renal insufficiency 09/22/2016  . Stroke [facility])     Past Surgical History:  Procedure Laterality Date  . ESOPHAGOGASTRODUODENOSCOPY (EGD) WITH PROPOFOL N/A 11/03/2015   Procedure: ESOPHAGOGASTRODUODENOSCOPY (EGD) WITH PROPOFOL;  Surgeon: Manus Gunning, MD;  Location: [place];  Service: Gastroenterology;  Laterality: N/A;  . IR REPLACE G-TUBE SIMPLE WO FLUORO  10/22/2018  . IR REPLC GASTRO/COLONIC TUBE PERCUT W/FLUORO  12/29/2016  . IR REPLC GASTRO/COLONIC TUBE PERCUT W/FLUORO  08/04/2018    Allergies  Allergen Reactions  . Scopolamine     Urinary retention    Outpatient Encounter Medications as of 01/27/2019  Medication Sig  . acetaminophen (TYLENOL) 325 MG tablet Place 650 mg into feeding tube 4 (four) times daily.   Marland Kitchen allopurinol (ZYLOPRIM) 150 mg TABS tablet Place 150 mg into feeding tube daily.  Marland Kitchen amLODipine (NORVASC) 5 MG tablet Place 5 mg into feeding tube daily. TAKE 1 TABLET VIA TUBE ONCE DAILY   . atenolol (TENORMIN) 25 MG tablet Take 25 mg by mouth daily.   Marland Kitchen atorvastatin (LIPITOR) 80 MG tablet Place 80 mg into feeding tube at bedtime.   . bisacodyl (DULCOLAX) 10 MG suppository Place 10 mg rectally as needed for moderate constipation.  . Carboxymethylcellulose Sodium (REFRESH LIQUIGEL) 1 % GEL Place 1 drop into both eyes 3 (three) times daily.   . divalproex (DEPAKOTE) 125 MG DR tablet 125 mg at bedtime. Via tube  . doxazosin (CARDURA) 1 MG tablet Place 1 mg into feeding tube daily.   Marland Kitchen escitalopram (LEXAPRO) 5 MG tablet Take 5 mg by mouth daily. For Depression  . famotidine (PEPCID) 40 MG/5ML suspension  Place 20 mg into feeding tube 2 (two) times daily.  Marland Kitchen guaiFENesin (ROBAFEN MUCUS/CHEST CONGESTION) 100 MG/5ML liquid Place 100 mg into feeding tube 4 (four) times daily.   Marland Kitchen LORazepam (ATIVAN) 0.5 MG tablet Place 1 tablet (0.5 mg total) into feeding tube 2 (two) times daily for 30 doses. For anxiety  . magnesium hydroxide (MILK OF MAGNESIA) 400 MG/5ML suspension Take 30 mLs by mouth daily as needed for mild  constipation.  . Menthol, Topical Analgesic, (BIOFREEZE) 4 % GEL Apply thin layer to bilat hands for pain  . Nutritional Supplements (NUTRITIONAL SUPPLEMENT PO) Place 80 mL/hr into feeding tube continuous. Diabeta Source  . OXYGEN Inhale 2 L into the lungs See admin instructions. 2 liters of oxygen as needed for hypoxia and KEEP SAT AT >90%  . QUEtiapine (SEROQUEL) 25 MG tablet Place 25 mg into feeding tube 2 (two) times daily.   . Sodium Phosphates (RA SALINE ENEMA) 19-7 GM/118ML ENEM Place 1 each rectally as needed (for constipation).  . traMADol (ULTRAM) 50 MG tablet Take 1 tablet (50 mg total) by mouth every 12 (twelve) hours.  . traMADol (ULTRAM) 50 MG tablet Take 1 tablet (50 mg total) by mouth every 12 (twelve) hours as needed.  . [DISCONTINUED] Water For Irrigation, Sterile (FREE WATER) SOLN Place 300 mLs into feeding tube 4 (four) times daily.   No facility-administered encounter medications on file as of 01/27/2019.     Review of Systems  Unable to obtain due to aphasia.    Immunization History  Administered Date(s) Administered  . Influenza-Unspecified 06/21/2015, 03/01/2018  . PPD Test 11/08/2015  . Pneumococcal Conjugate-13 09/06/2017, 03/16/2018  . Pneumococcal-Unspecified 06/21/2015  . Tdap 11/14/2018   Pertinent  Health Maintenance Due  Topic Date Due  . OPHTHALMOLOGY EXAM  02/01/2019 (Originally 10/06/2018)  . COLONOSCOPY  02/01/2019 (Originally 10/15/2000)  . INFLUENZA VACCINE  02/08/2019 (Originally 12/07/2018)  . HEMOGLOBIN A1C  02/26/2019 (Originally 01/27/2019)  . FOOT EXAM  05/18/2019  . PNA vac Low Risk Adult (2 of 2 - PPSV23) 06/20/2020   Fall Risk  01/03/2018 03/09/2017 01/02/2017 11/15/2016 10/13/2016  Falls in the past year? Yes Exclusion - non ambulatory Yes No Yes  Comment - - - - -  Number falls in past yr: 1 - 2 or more - 2 or more  Comment - - - - -  Injury with Fall? Yes - No - No  Risk Factor Category  - - - - High Fall Risk  Risk for fall due to : - -  - - -  Follow up - - - - -     Vitals:   01/27/19 1405  BP: 134/77  Pulse: 62  Resp: 19  Temp: 98.8 F (37.1 C)  TempSrc: Oral  SpO2: 98%  Weight: 164 lb 9.6 oz (74.7 kg)  Height: 5\' 9"  (1.753 m)   Body mass index is 24.31 kg/m.  Physical Exam  GENERAL APPEARANCE: Well nourished. In no acute distress. Normal body habitus SKIN:  Skin is warm and dry.  MOUTH and THROAT: Lips are without lesions. Oral mucosa is moist and without lesions.  RESPIRATORY: Breathing is even & unlabored, BS CTAB CARDIAC: RRR, no murmur,no extra heart sounds, no edema GI: Abdomen soft, normal BS, no masses, no tenderness, +pegtube NEUROLOGICAL: There is no tremor. Aphasic, left hemiplegia. PSYCHIATRIC: Mood affect and behavior are appropriate  Labs reviewed: Recent Labs    07/08/18 0555  07/09/18 0510  07/10/18 0540  08/01/18 0612 10/09/18 1120 11/19/18 0159  NA 150*   < > 141   < > 141   < > 147* 140 140  K 4.2   < > 4.0   < > 4.3   < > 5.6* 4.8 4.4  CL 120*   < > 114*   < > 111   < > 117* 102 103  CO2 23   < > 23   < > 23   < > 22 22 20*  GLUCOSE 214*   < > 219*   < > 192*   < > 98 106* 117*  BUN 53*   < > 32*   < > 28*   < > 32* 50* 42*  CREATININE 1.30*   < > 1.23   < > 1.17   < > 1.18 1.82* 1.66*  CALCIUM 8.3*   < > 8.0*   < > 8.2*   < > 8.6* 9.7 9.8  MG 2.5*  --  2.0  --  2.1  --   --   --   --    < > = values in this interval not displayed.   Recent Labs    05/22/18 07/27/18 1314  AST 29 43*  ALT 30 26  ALKPHOS 132* 78  BILITOT  --  0.4  PROT  --  7.0  ALBUMIN  --  2.0*   Recent Labs    07/27/18 1314  07/31/18 1023 10/09/18 1120 11/19/18 0159  WBC 11.4*   < > 13.1* 15.1* 11.5*  NEUTROABS 10.1*  --   --  10.6* 9.8*  HGB 9.9*   < > 11.9* 14.2 13.4  HCT 32.2*   < > 37.0* 45.6 41.5  MCV 91.2   < > 90.9 87.2 83.0  PLT 134*   < > 276 280 157   < > = values in this interval not displayed.   Lab Results  Component Value Date   TSH 2.56 10/25/2017   Lab Results   Component Value Date   HGBA1C 6.8 (H) 07/27/2018   Lab Results  Component Value Date   CHOL 96 10/25/2017   HDL 29 (A) 10/25/2017   LDLCALC 50 10/25/2017   TRIG 86 10/25/2017   CHOLHDL 3.2 10/24/2015     Assessment/Plan  1. Essential hypertension Stable, continue amlodipine and atenolol  2. Benign prostatic hyperplasia, unspecified whether lower urinary tract symptoms present -Has Foley catheter, continue doxazosin  3. Anxiety. -Mood is stable, continue Ativan 0.5 mg 1 tab twice a day and twice daily PRN  4. Episode of recurrent major depressive disorder, unspecified depression episode severity ([place]) -Continue escitalopram and Quetiapine, followed by psych NP   Family/ staff Communication: Discussed plan of care with resident and charge nurse.  Labs/tests ordered: None  Goals of care:   Long-term care   Durenda Age, DNP, FNP-BC [facility] and Adult Medicine [phone] (Monday-Friday 8:00 a.m. - 5:00 p.m.) [phone] (after hours)

## 2019-01-28 DIAGNOSIS — M6281 Muscle weakness (generalized): Secondary | ICD-10-CM | POA: Diagnosis not present

## 2019-01-28 DIAGNOSIS — I69354 Hemiplegia and hemiparesis following cerebral infarction affecting left non-dominant side: Secondary | ICD-10-CM | POA: Diagnosis not present

## 2019-01-28 DIAGNOSIS — R293 Abnormal posture: Secondary | ICD-10-CM | POA: Diagnosis not present

## 2019-01-28 DIAGNOSIS — R41841 Cognitive communication deficit: Secondary | ICD-10-CM | POA: Diagnosis not present

## 2019-01-28 DIAGNOSIS — R296 Repeated falls: Secondary | ICD-10-CM | POA: Diagnosis not present

## 2019-01-29 DIAGNOSIS — M6281 Muscle weakness (generalized): Secondary | ICD-10-CM | POA: Diagnosis not present

## 2019-01-29 DIAGNOSIS — R296 Repeated falls: Secondary | ICD-10-CM | POA: Diagnosis not present

## 2019-01-29 DIAGNOSIS — R41841 Cognitive communication deficit: Secondary | ICD-10-CM | POA: Diagnosis not present

## 2019-01-29 DIAGNOSIS — I69354 Hemiplegia and hemiparesis following cerebral infarction affecting left non-dominant side: Secondary | ICD-10-CM | POA: Diagnosis not present

## 2019-01-29 DIAGNOSIS — R293 Abnormal posture: Secondary | ICD-10-CM | POA: Diagnosis not present

## 2019-01-30 DIAGNOSIS — R293 Abnormal posture: Secondary | ICD-10-CM | POA: Diagnosis not present

## 2019-01-30 DIAGNOSIS — I69354 Hemiplegia and hemiparesis following cerebral infarction affecting left non-dominant side: Secondary | ICD-10-CM | POA: Diagnosis not present

## 2019-01-30 DIAGNOSIS — M6281 Muscle weakness (generalized): Secondary | ICD-10-CM | POA: Diagnosis not present

## 2019-01-30 DIAGNOSIS — R41841 Cognitive communication deficit: Secondary | ICD-10-CM | POA: Diagnosis not present

## 2019-01-30 DIAGNOSIS — R296 Repeated falls: Secondary | ICD-10-CM | POA: Diagnosis not present

## 2019-01-31 DIAGNOSIS — I69354 Hemiplegia and hemiparesis following cerebral infarction affecting left non-dominant side: Secondary | ICD-10-CM | POA: Diagnosis not present

## 2019-01-31 DIAGNOSIS — R296 Repeated falls: Secondary | ICD-10-CM | POA: Diagnosis not present

## 2019-01-31 DIAGNOSIS — R293 Abnormal posture: Secondary | ICD-10-CM | POA: Diagnosis not present

## 2019-01-31 DIAGNOSIS — M6281 Muscle weakness (generalized): Secondary | ICD-10-CM | POA: Diagnosis not present

## 2019-01-31 DIAGNOSIS — R41841 Cognitive communication deficit: Secondary | ICD-10-CM | POA: Diagnosis not present

## 2019-02-03 ENCOUNTER — Non-Acute Institutional Stay (SKILLED_NURSING_FACILITY): Payer: Medicare Other | Admitting: Adult Health

## 2019-02-03 ENCOUNTER — Encounter: Payer: Self-pay | Admitting: Adult Health

## 2019-02-03 DIAGNOSIS — Z7189 Other specified counseling: Secondary | ICD-10-CM | POA: Diagnosis not present

## 2019-02-03 DIAGNOSIS — I10 Essential (primary) hypertension: Secondary | ICD-10-CM

## 2019-02-03 DIAGNOSIS — F332 Major depressive disorder, recurrent severe without psychotic features: Secondary | ICD-10-CM | POA: Diagnosis not present

## 2019-02-03 DIAGNOSIS — F39 Unspecified mood [affective] disorder: Secondary | ICD-10-CM | POA: Diagnosis not present

## 2019-02-03 DIAGNOSIS — R41841 Cognitive communication deficit: Secondary | ICD-10-CM | POA: Diagnosis not present

## 2019-02-03 DIAGNOSIS — E782 Mixed hyperlipidemia: Secondary | ICD-10-CM

## 2019-02-03 DIAGNOSIS — N400 Enlarged prostate without lower urinary tract symptoms: Secondary | ICD-10-CM | POA: Diagnosis not present

## 2019-02-03 DIAGNOSIS — I69354 Hemiplegia and hemiparesis following cerebral infarction affecting left non-dominant side: Secondary | ICD-10-CM | POA: Diagnosis not present

## 2019-02-03 DIAGNOSIS — R293 Abnormal posture: Secondary | ICD-10-CM | POA: Diagnosis not present

## 2019-02-03 DIAGNOSIS — M6281 Muscle weakness (generalized): Secondary | ICD-10-CM | POA: Diagnosis not present

## 2019-02-03 DIAGNOSIS — F419 Anxiety disorder, unspecified: Secondary | ICD-10-CM

## 2019-02-03 DIAGNOSIS — R296 Repeated falls: Secondary | ICD-10-CM | POA: Diagnosis not present

## 2019-02-03 NOTE — Progress Notes (Signed)
Location:  [facility] Room Number: 116-A Place of Service:  SNF (31) Provider:  Durenda Age, DNP, FNP-BC  Patient Care Team: [name], MD as PCP - General (Internal Medicine) [place] as Referring Physician (General Practice) Letta Pate Luanna Salk, MD as Consulting Physician (Physical Medicine and Rehabilitation) Rosalin Hawking, MD as Consulting Physician (Neurology) [place]-Vargas, Senaida Lange, NP as Nurse Practitioner (Internal Medicine)  Extended Emergency Contact Information Primary Emergency Contact: Ivan Anchors [place] of [place] Phone: [phone] Relation: Brother  Code Status:  Full Code  Goals of care: Advanced Directive information Advanced Directives 01/27/2019  Does Patient Have a Medical Advance Directive? Yes  Type of Advance Directive (No Data)  Does patient want to make changes to medical advance directive? No - Patient declined  Copy of [place] in Chart? -  Would patient like information on creating a medical advance directive? No - Patient declined  Pre-existing out of facility DNR order (yellow form or pink MOST form) -     Chief Complaint  Patient presents with  . Advanced Directive    Patient is seen for a care plan meeting    HPI:  Pt is a 68 y.o. male seen today for a care plan meeting. He is a long-term care resident of [facility]. He has a PMH of aphasia,  byHTN, HLD, and diabetes mellitus with renal insufficiency. Care plan meeting was attended by NP, social worker, dietician,  and MDS coordinator. Brother, [place], attended via telephone conference. He was recently discharged from hospice per brother's request. He continues to be full code. Discussed medications, vital signs and weight. He continues to need Ativan for anxiety. He is aphasic, able to make gestures in order to communicate. The meeting lasted for 20 minutes.   Past Medical History:   Diagnosis Date  . Diabetes mellitus without complication ([place])   . Dysarthria   . Dysphagia   . GI bleed   . Hyperlipidemia   . Hypertension   . Renal insufficiency 09/22/2016  . Stroke [facility])    Past Surgical History:  Procedure Laterality Date  . ESOPHAGOGASTRODUODENOSCOPY (EGD) WITH PROPOFOL N/A 11/03/2015   Procedure: ESOPHAGOGASTRODUODENOSCOPY (EGD) WITH PROPOFOL;  Surgeon: Manus Gunning, MD;  Location: [place];  Service: Gastroenterology;  Laterality: N/A;  . IR REPLACE G-TUBE SIMPLE WO FLUORO  10/22/2018  . IR REPLC GASTRO/COLONIC TUBE PERCUT W/FLUORO  12/29/2016  . IR REPLC GASTRO/COLONIC TUBE PERCUT W/FLUORO  08/04/2018    Allergies  Allergen Reactions  . Scopolamine     Urinary retention    Outpatient Encounter Medications as of 02/03/2019  Medication Sig  . acetaminophen (TYLENOL) 325 MG tablet Place 650 mg into feeding tube 4 (four) times daily.   Marland Kitchen allopurinol (ZYLOPRIM) 150 mg TABS tablet Place 150 mg into feeding tube daily.  Marland Kitchen amLODipine (NORVASC) 5 MG tablet Place 5 mg into feeding tube daily.   Marland Kitchen atenolol (TENORMIN) 25 MG tablet Take 25 mg by mouth every morning. Hold for SBP less than 104 or heart rate less than 60  . atorvastatin (LIPITOR) 80 MG tablet Place 80 mg into feeding tube at bedtime.   . bisacodyl (DULCOLAX) 10 MG suppository Place 10 mg rectally as needed for moderate constipation.  . Carboxymethylcellulose Sodium (REFRESH LIQUIGEL) 1 % GEL Place 1 drop into both eyes 3 (three) times daily.   . divalproex (DEPAKOTE) 125 MG DR tablet 125 mg at bedtime. Via tube  . doxazosin (CARDURA)  1 MG tablet Place 1 mg into feeding tube daily.   Marland Kitchen escitalopram (LEXAPRO) 5 MG tablet Take 5 mg by mouth daily. For Depression  . famotidine (PEPCID) 40 MG/5ML suspension Place 20 mg into feeding tube 2 (two) times daily.  Marland Kitchen guaiFENesin (ROBAFEN MUCUS/CHEST CONGESTION) 100 MG/5ML liquid Place 100 mg into feeding tube 4 (four) times daily.   Marland Kitchen LORazepam  (ATIVAN) 0.5 MG tablet Take 0.5 mg by mouth 2 (two) times daily as needed for anxiety. x14 days  . magnesium hydroxide (MILK OF MAGNESIA) 400 MG/5ML suspension Take 30 mLs by mouth daily as needed for mild constipation.  . Menthol, Topical Analgesic, (BIOFREEZE) 4 % GEL Apply thin layer to bilat hands for pain  . Nutritional Supplements (NUTRITIONAL SUPPLEMENT PO) Place 80 mL/hr into feeding tube continuous. Diabeta Source  . OXYGEN Inhale 2 L into the lungs See admin instructions. 2 liters of oxygen as needed for hypoxia and KEEP SAT AT >90%  . QUEtiapine (SEROQUEL) 25 MG tablet Place 25 mg into feeding tube 2 (two) times daily.   . Sodium Phosphates (RA SALINE ENEMA) 19-7 GM/118ML ENEM Place 1 each rectally as needed (for constipation).  . traMADol (ULTRAM) 50 MG tablet Take 1 tablet (50 mg total) by mouth every 12 (twelve) hours.  . traMADol (ULTRAM) 50 MG tablet Take 1 tablet (50 mg total) by mouth every 12 (twelve) hours as needed.  . [DISCONTINUED] LORazepam (ATIVAN) 0.5 MG tablet Place 1 tablet (0.5 mg total) into feeding tube 2 (two) times daily for 30 doses. For anxiety   No facility-administered encounter medications on file as of 02/03/2019.     Review of Systems Unable to obtain due to aphasia   Immunization History  Administered Date(s) Administered  . Influenza-Unspecified 06/21/2015, 03/01/2018  . PPD Test 11/08/2015  . Pneumococcal Conjugate-13 09/06/2017, 03/16/2018  . Pneumococcal-Unspecified 06/21/2015  . Tdap 11/14/2018   Pertinent  Health Maintenance Due  Topic Date Due  . COLONOSCOPY  10/15/2000  . INFLUENZA VACCINE  02/08/2019 (Originally 12/07/2018)  . HEMOGLOBIN A1C  02/26/2019 (Originally 01/27/2019)  . OPHTHALMOLOGY EXAM  05/09/2019 (Originally 10/06/2018)  . FOOT EXAM  05/18/2019  . PNA vac Low Risk Adult (2 of 2 - PPSV23) 06/20/2020   Fall Risk  01/03/2018 03/09/2017 01/02/2017 11/15/2016 10/13/2016  Falls in the past year? Yes Exclusion - non ambulatory Yes No  Yes  Comment - - - - -  Number falls in past yr: 1 - 2 or more - 2 or more  Comment - - - - -  Injury with Fall? Yes - No - No  Risk Factor Category  - - - - High Fall Risk  Risk for fall due to : - - - - -  Follow up - - - - -     Vitals:   02/03/19 1125  BP: 135/70  Pulse: 64  Resp: 17  Temp: 97.9 F (36.6 C)  TempSrc: Oral  Weight: 164 lb 9.6 oz (74.7 kg)  Height: 5\' 9"  (1.753 m)   Body mass index is 24.31 kg/m.  Physical Exam  GENERAL APPEARANCE: Well nourished. In no acute distress. Normal body habitus SKIN:  Skin is warm and dry.  MOUTH and THROAT: Lips are without lesions. Oral mucosa is moist and without lesions.  RESPIRATORY: Breathing is even & unlabored, BS CTAB CARDIAC: RRR, no murmur,no extra heart sounds, no edema GI: +pegtube NEUROLOGICAL: There is no tremor. Aphasic. Left hemiplegia. Drools. PSYCHIATRIC:  Affect and behavior are appropriate  Labs reviewed: Recent Labs    07/08/18 0555  07/09/18 0510  07/10/18 0540  08/01/18 0612 10/09/18 1120 11/19/18 0159  NA 150*   < > 141   < > 141   < > 147* 140 140  K 4.2   < > 4.0   < > 4.3   < > 5.6* 4.8 4.4  CL 120*   < > 114*   < > 111   < > 117* 102 103  CO2 23   < > 23   < > 23   < > 22 22 20*  GLUCOSE 214*   < > 219*   < > 192*   < > 98 106* 117*  BUN 53*   < > 32*   < > 28*   < > 32* 50* 42*  CREATININE 1.30*   < > 1.23   < > 1.17   < > 1.18 1.82* 1.66*  CALCIUM 8.3*   < > 8.0*   < > 8.2*   < > 8.6* 9.7 9.8  MG 2.5*  --  2.0  --  2.1  --   --   --   --    < > = values in this interval not displayed.   Recent Labs    05/22/18 07/27/18 1314  AST 29 43*  ALT 30 26  ALKPHOS 132* 78  BILITOT  --  0.4  PROT  --  7.0  ALBUMIN  --  2.0*   Recent Labs    07/27/18 1314  07/31/18 1023 10/09/18 1120 11/19/18 0159  WBC 11.4*   < > 13.1* 15.1* 11.5*  NEUTROABS 10.1*  --   --  10.6* 9.8*  HGB 9.9*   < > 11.9* 14.2 13.4  HCT 32.2*   < > 37.0* 45.6 41.5  MCV 91.2   < > 90.9 87.2 83.0  PLT 134*    < > 276 280 157   < > = values in this interval not displayed.   Lab Results  Component Value Date   TSH 2.56 10/25/2017   Lab Results  Component Value Date   HGBA1C 6.8 (H) 07/27/2018   Lab Results  Component Value Date   CHOL 96 10/25/2017   HDL 29 (A) 10/25/2017   LDLCALC 50 10/25/2017   TRIG 86 10/25/2017   CHOLHDL 3.2 10/24/2015    Assessment/Plan  1. Advance care planning - discussed medications, vital signs, labs and weights - remains to be full code  2. Essential hypertension -Well-controlled, continue amlodipine  3. Mixed hyperlipidemia -Continue atorvastatin  4. Benign prostatic hyperplasia, unspecified whether lower urinary tract symptoms present -Continue doxazosin  5. Severe episode of recurrent major depressive disorder, without psychotic features ([place]) -Continue Escitalopram and Quetiapine, followed up by psych NP  6. Anxiety -Has occasional agitation/anxiety, continue Ativan  7. Mood disorder ([place]) -Continue divalproex sodium   Family/ staff Communication:  Discussed plan of care with IDT, brother and brother.  Labs/tests ordered:  None  Goals of care:   Long-term care.   Durenda Age, DNP, FNP-BC [facility] and Adult Medicine [phone] (Monday-Friday 8:00 a.m. - 5:00 p.m.) [phone] (after hours)

## 2019-02-04 DIAGNOSIS — M6281 Muscle weakness (generalized): Secondary | ICD-10-CM | POA: Diagnosis not present

## 2019-02-04 DIAGNOSIS — R41841 Cognitive communication deficit: Secondary | ICD-10-CM | POA: Diagnosis not present

## 2019-02-04 DIAGNOSIS — R296 Repeated falls: Secondary | ICD-10-CM | POA: Diagnosis not present

## 2019-02-04 DIAGNOSIS — R293 Abnormal posture: Secondary | ICD-10-CM | POA: Diagnosis not present

## 2019-02-04 DIAGNOSIS — I69354 Hemiplegia and hemiparesis following cerebral infarction affecting left non-dominant side: Secondary | ICD-10-CM | POA: Diagnosis not present

## 2019-02-05 DIAGNOSIS — R293 Abnormal posture: Secondary | ICD-10-CM | POA: Diagnosis not present

## 2019-02-05 DIAGNOSIS — R41841 Cognitive communication deficit: Secondary | ICD-10-CM | POA: Diagnosis not present

## 2019-02-05 DIAGNOSIS — Z7189 Other specified counseling: Secondary | ICD-10-CM | POA: Insufficient documentation

## 2019-02-05 DIAGNOSIS — I69354 Hemiplegia and hemiparesis following cerebral infarction affecting left non-dominant side: Secondary | ICD-10-CM | POA: Diagnosis not present

## 2019-02-05 DIAGNOSIS — R296 Repeated falls: Secondary | ICD-10-CM | POA: Diagnosis not present

## 2019-02-05 DIAGNOSIS — M6281 Muscle weakness (generalized): Secondary | ICD-10-CM | POA: Diagnosis not present

## 2019-02-10 ENCOUNTER — Other Ambulatory Visit: Payer: Self-pay | Admitting: Adult Health

## 2019-02-10 MED ORDER — TRAMADOL HCL 50 MG PO TABS: 50.0000 mg | ORAL_TABLET | Freq: Two times a day (BID) | ORAL | 0 refills | Status: AC

## 2019-02-10 MED ORDER — TRAMADOL HCL 50 MG PO TABS: 50.0000 mg | ORAL_TABLET | Freq: Two times a day (BID) | ORAL | 0 refills | Status: DC | PRN

## 2019-02-20 ENCOUNTER — Emergency Department (HOSPITAL_COMMUNITY)
Admission: EM | Admit: 2019-02-20 | Discharge: 2019-02-20 | Disposition: A | Discharge status: Home / Self Care | Payer: No Typology Code available for payment source | Attending: Emergency Medicine | Admitting: Emergency Medicine

## 2019-02-20 ENCOUNTER — Emergency Department (HOSPITAL_COMMUNITY)
Admission: RE | Admit: 2019-02-20 | Discharge: 2019-02-20 | Disposition: A | Discharge status: Home / Self Care | Payer: No Typology Code available for payment source | Source: Ambulatory Visit | Attending: Nurse Practitioner | Admitting: Nurse Practitioner

## 2019-02-20 ENCOUNTER — Other Ambulatory Visit (HOSPITAL_COMMUNITY): Payer: Self-pay | Admitting: Nurse Practitioner

## 2019-02-20 ENCOUNTER — Emergency Department (HOSPITAL_COMMUNITY): Payer: No Typology Code available for payment source

## 2019-02-20 ENCOUNTER — Encounter (HOSPITAL_COMMUNITY): Payer: Self-pay | Admitting: Radiology

## 2019-02-20 DIAGNOSIS — I2510 Atherosclerotic heart disease of native coronary artery without angina pectoris: Secondary | ICD-10-CM | POA: Insufficient documentation

## 2019-02-20 DIAGNOSIS — Z79899 Other long term (current) drug therapy: Secondary | ICD-10-CM | POA: Diagnosis not present

## 2019-02-20 DIAGNOSIS — Z85828 Personal history of other malignant neoplasm of skin: Secondary | ICD-10-CM | POA: Diagnosis not present

## 2019-02-20 DIAGNOSIS — Z8673 Personal history of transient ischemic attack (TIA), and cerebral infarction without residual deficits: Secondary | ICD-10-CM | POA: Insufficient documentation

## 2019-02-20 DIAGNOSIS — T85528A Displacement of other gastrointestinal prosthetic devices, implants and grafts, initial encounter: Secondary | ICD-10-CM | POA: Diagnosis present

## 2019-02-20 DIAGNOSIS — K9423 Gastrostomy malfunction: Secondary | ICD-10-CM

## 2019-02-20 DIAGNOSIS — Z87891 Personal history of nicotine dependence: Secondary | ICD-10-CM | POA: Diagnosis not present

## 2019-02-20 DIAGNOSIS — E119 Type 2 diabetes mellitus without complications: Secondary | ICD-10-CM | POA: Diagnosis not present

## 2019-02-20 DIAGNOSIS — I10 Essential (primary) hypertension: Secondary | ICD-10-CM | POA: Insufficient documentation

## 2019-02-20 DIAGNOSIS — Y828 Other medical devices associated with adverse incidents: Secondary | ICD-10-CM | POA: Insufficient documentation

## 2019-02-20 DIAGNOSIS — R404 Transient alteration of awareness: Secondary | ICD-10-CM | POA: Diagnosis not present

## 2019-02-20 DIAGNOSIS — Z931 Gastrostomy status: Secondary | ICD-10-CM

## 2019-02-20 HISTORY — PX: IR REPLACE G-TUBE SIMPLE WO FLUORO: IMG2323

## 2019-02-20 MED ORDER — LIDOCAINE VISCOUS HCL 2 % MT SOLN
OROMUCOSAL | Status: AC
  Filled 2019-02-20: qty 15

## 2019-02-20 MED ORDER — LIDOCAINE VISCOUS HCL 2 % MT SOLN
OROMUCOSAL | Status: DC | PRN
  Administered 2019-02-20: 15 mL via OROMUCOSAL

## 2019-02-20 NOTE — ED Notes (Signed)
Patient transported to IR 

## 2019-02-20 NOTE — ED Notes (Signed)
Spoke with OR general coordinator, they will tube 14 French G-Tube.

## 2019-02-20 NOTE — ED Notes (Signed)
OR unable to obtain a 14 [place] G-Tube, will send a 80 [place].

## 2019-02-20 NOTE — ED Notes (Signed)
Provided PTAR and Patient with discharge instructions.   Pt discharged from ED.

## 2019-02-20 NOTE — ED Notes (Signed)
Attempt x2 to update [facility] and provide report without success.

## 2019-02-20 NOTE — Discharge Instructions (Addendum)
Sean Collins g tube was replaced with a 14 French foley catheter since we do not have that size g tube in the emergency department today.  The tube was checked after placement and is full functioning. It can be used immediately.  If tube becomes dislodged in the future please do exactly what you did today. Patient can go directly to Interventional Radiology if you call them beforehand like you tried to do today.

## 2019-02-20 NOTE — Procedures (Signed)
A new 14 french balloon retention Foley/G tube was placed into previous G tube access site utilizing guidewire and dilators/lidocaine gel. Balloon was inflated with 10 cc saline and secured to skin site. Gastric contents aspirated. No immediate complications. EBL< 2 cc.

## 2019-02-20 NOTE — ED Triage Notes (Signed)
Per EMS: Pt from [facility] with a c/o G-Tube displacement.  Staff from [place] reports this happens frequently. Pt is nonverbal and has left sided weakness from a previous stroke, will follow commands per staff.  EMS notes from facility, pt is mentating at baseline.    EMS Vitals:   BP 142/94 HR 54 RR 18  Sp02 96% RA CBG 112 Temp 98.0

## 2019-02-20 NOTE — ED Provider Notes (Signed)
[place] EMERGENCY DEPARTMENT Provider Note   CSN: [id number] Arrival date & time: 02/20/19  1006     History   Chief Complaint G tube displacement  HPI Sean Collins is a 68 y.o. male with past medical history significant for diabetes, hypertension, CKD, CVA with dysarthria and aphasia, left-sided weakness presents to emergency department today via EMS after his G-tube was suddenly displaced.  Patient is nonverbal at baseline, history was provided by nursing staff at [facility] facility.  She states around 9 AM patient was receiving a bath when the G-tube was accidentally dislodged.  She attempted to replace it but was unable to, reports size is 52 [place]. She reports patient is otherwise at his baseline. Level 5 caveat applies as pt is nonverbal.  Patient shakes his head no when asked if he is in any pain.    Chart reviewshows patient was last seen in 10/2018 for IR to place G-tube after unsuccessful ED attempt.    Past Medical History:  Diagnosis Date  . Diabetes mellitus without complication ([place])   . Dysarthria   . Dysphagia   . GI bleed   . Hyperlipidemia   . Hypertension   . Renal insufficiency 09/22/2016  . Stroke [facility])     Patient Active Problem List   Diagnosis Date Noted  . Advance care planning 02/05/2019  . BPH (benign prostatic hyperplasia) 01/27/2019  . Mood disorder ([place]) 01/09/2019  . Central sleep apnea 12/10/2018  . Hospice care patient 11/29/2018  . Palliative care patient 11/29/2018  . History of GI bleed 11/23/2018  . Chronic pain 11/23/2018  . Anxiety 11/23/2018  . Bradycardia 11/13/2018  . Normochromic anemia 09/11/2018  . Adult failure to thrive syndrome 08/06/2018  . Urinary retention 08/01/2018  . Hyperkalemia 07/27/2018  . Acute kidney injury ([place]) 07/27/2018  . Aphasia   . AKI (acute kidney injury) ([place]) 07/06/2018  . Neoplasm of skin of abdomen 04/23/2018  . Thrombocytopenia ([place]) 01/22/2018   . Renal insufficiency 09/22/2016  . Type 2 diabetes mellitus with diabetic nephropathy, without long-term current use of insulin ([place]) 09/13/2016  . Depressive psychosis ([place]) 09/13/2016  . Cervical myofascial pain syndrome 08/29/2016  . Cervical facet joint syndrome 08/29/2016  . Recurrent falls 07/11/2016  . HCAP (healthcare-associated pneumonia) 07/05/2016  . Major depressive disorder, recurrent episode ([place]) 04/17/2016  . Shoulder pain, left 02/07/2016  . Essential hypertension 12/08/2015  . Late effects of CVA (cerebrovascular accident) 12/08/2015  . Cerebrovascular accident (CVA) due to thrombosis of right middle cerebral artery ([place])   . Slurred speech   . Malnutrition of moderate degree 11/04/2015  . Left hemiparesis ([place])   . Dysphagia   . Hyperlipidemia 11/16/2011  . Gout 11/16/2011  . History of stroke 11/16/2011  . CAD (coronary artery disease) 11/16/2011    Past Surgical History:  Procedure Laterality Date  . ESOPHAGOGASTRODUODENOSCOPY (EGD) WITH PROPOFOL N/A 11/03/2015   Procedure: ESOPHAGOGASTRODUODENOSCOPY (EGD) WITH PROPOFOL;  Surgeon: Manus Gunning, MD;  Location: [facility];  Service: Gastroenterology;  Laterality: N/A;  . IR REPLACE G-TUBE SIMPLE WO FLUORO  10/22/2018  . IR REPLC GASTRO/COLONIC TUBE PERCUT W/FLUORO  12/29/2016  . IR REPLC GASTRO/COLONIC TUBE PERCUT W/FLUORO  08/04/2018        Home Medications    Prior to Admission medications   Medication Sig Start Date End Date Taking? Authorizing Provider  acetaminophen (TYLENOL) 325 MG tablet Place 650 mg into feeding tube 4 (four) times daily.     [provider]  allopurinol (ZYLOPRIM) 150 mg TABS tablet Place 150 mg into feeding tube daily.    [provider]  amLODipine (NORVASC) 5 MG tablet Place 5 mg into feeding tube daily.     [provider]  atenolol (TENORMIN) 25 MG tablet Take 25 mg by mouth every morning. Hold for SBP less than 104 or heart rate less than  60    [provider]  atorvastatin (LIPITOR) 80 MG tablet Place 80 mg into feeding tube at bedtime.     [provider]  bisacodyl (DULCOLAX) 10 MG suppository Place 10 mg rectally as needed for moderate constipation.    [provider]  Carboxymethylcellulose Sodium (REFRESH LIQUIGEL) 1 % GEL Place 1 drop into both eyes 3 (three) times daily.     [provider]  divalproex (DEPAKOTE) 125 MG DR tablet 125 mg at bedtime. Via tube    [provider]  doxazosin (CARDURA) 1 MG tablet Place 1 mg into feeding tube daily.     [provider]  escitalopram (LEXAPRO) 5 MG tablet Take 5 mg by mouth daily. For Depression    [provider]  famotidine (PEPCID) 40 MG/5ML suspension Place 20 mg into feeding tube 2 (two) times daily.    [provider]  guaiFENesin (ROBAFEN MUCUS/CHEST CONGESTION) 100 MG/5ML liquid Place 100 mg into feeding tube 4 (four) times daily.     [provider]  LORazepam (ATIVAN) 0.5 MG tablet Place 0.5 mg into feeding tube 2 (two) times daily as needed for anxiety. x14 days  01/10/19   [provider]  LORazepam (ATIVAN) 0.5 MG tablet Place 0.5 mg into feeding tube 2 (two) times daily. Scheduled    [provider]  magnesium hydroxide (MILK OF MAGNESIA) 400 MG/5ML suspension Take 30 mLs by mouth daily as needed for mild constipation.    [provider]  Menthol, Topical Analgesic, (BIOFREEZE) 4 % GEL Apply thin layer to bilat hands for pain    [provider]  Nutritional Supplements (NUTRITIONAL SUPPLEMENT PO) Place 80 mL/hr into feeding tube continuous. Diabeta Source    [provider]  OXYGEN Inhale 2 L into the lungs See admin instructions. 2 liters of oxygen as needed for hypoxia and KEEP SAT AT >90%    [provider]  QUEtiapine (SEROQUEL) 25 MG tablet Place 25 mg into feeding tube 2 (two) times daily.     [provider]  Sodium  Phosphates (RA SALINE ENEMA) 19-7 GM/118ML ENEM Place 1 each rectally as needed (for constipation).    [provider]  traMADol (ULTRAM) 50 MG tablet Take 1 tablet (50 mg total) by mouth every 12 (twelve) hours. 02/10/19   Medina-Vargas, Monina C, NP  traMADol (ULTRAM) 50 MG tablet Take 1 tablet (50 mg total) by mouth every 12 (twelve) hours as needed. 02/10/19 02/10/20  Medina-Vargas, Senaida Lange, NP    Family History Family History  Problem Relation Age of Onset  . Stroke Brother   . Diabetes Brother   . Stroke Brother   . Stroke Maternal Aunt     Social History Social History   Tobacco Use  . Smoking status: Former Smoker    Packs/day: 0.50    Years: 32.00    Pack years: 16.00    Types: Cigarettes  . Smokeless tobacco: Never Used  . Tobacco comment: Started at age 74 though quit for 10 years at one point  Substance Use Topics  . Alcohol use: No  Alcohol/week: 0.0 standard drinks  . Drug use: No     Allergies   Scopolamine   Review of Systems Review of Systems  Unable to perform ROS: Patient nonverbal     Physical Exam Updated Vital Signs BP (!) 141/83 (BP Location: Right Arm)   Pulse (!) 51   Temp 98.4 F (36.9 C) (Oral)   Resp 18   SpO2 98%   Physical Exam Vitals signs and nursing note reviewed.  Constitutional:      General: He is not in acute distress.    Appearance: He is not ill-appearing.  HENT:     Head: Normocephalic and atraumatic.     Right Ear: Tympanic membrane and external ear normal.     Left Ear: Tympanic membrane and external ear normal.     Nose: Nose normal.     Mouth/Throat:     Mouth: Mucous membranes are moist.     Pharynx: Oropharynx is clear.  Eyes:     General: No scleral icterus.       Right eye: No discharge.        Left eye: No discharge.     Extraocular Movements: Extraocular movements intact.     Conjunctiva/sclera: Conjunctivae normal.     Pupils: Pupils are equal, round, and reactive to light.  Neck:      Musculoskeletal: Normal range of motion.     Vascular: No JVD.  Cardiovascular:     Rate and Rhythm: Normal rate and regular rhythm.     Pulses: Normal pulses.          Radial pulses are 2+ on the right side and 2+ on the left side.     Heart sounds: Normal heart sounds.  Pulmonary:     Comments: Lungs clear to auscultation in all fields. Symmetric chest rise. No wheezing, rales, or rhonchi. Abdominal:     Comments: Abdomen is soft, non-distended, and non-tender in all quadrants. No rigidity, no guarding. No peritoneal signs.  Stoma in the upper abdomen with minimal bleeding. No signs of surrounding infection  Skin:    General: Skin is warm and dry.     Capillary Refill: Capillary refill takes less than 2 seconds.  Neurological:     Mental Status: Mental status is at baseline.     GCS: GCS eye subscore is 4. GCS verbal subscore is 5. GCS motor subscore is 6.     Comments: Left side is contracted, rehab RN reports this is his baseline      ED Treatments / Results  Labs (all labs ordered are listed, but only abnormal results are displayed) Labs Reviewed - No data to display  EKG None  Radiology No results found.  Procedures Procedures (including critical care time)  Medications Ordered in ED Medications - No data to display   Initial Impression / Assessment and Plan / ED Course  I have reviewed the triage vital signs and the nursing notes.  Pertinent labs & imaging results that were available during my care of the patient were reviewed by me and considered in my medical decision making (see chart for details).  Patient seen and examined. Patient nontoxic appearing, in no apparent distress, vitals WNL. Pt is nonverbal and unable to provide history. I spoke with staff at [facility] who reports G tubes was accidentally dislodged x 1 hour prior to arrival, unable to be replaced by staff. On exam pt is very pleasant, he waves hello and is able to shake his head yes  or no.  He denies any pain currently. No signs of infection to stoma, abdomen is non tender to palpation. Staff forgot to send G tube with patient, will order one and attempt to replace. Unable to place 43 [place] without significant pain.  56 [place] is unavailable. Patient appears very uncomfortable during attempt.  Consulted IR Dr. Laurence Ferrari who will bring pt to IR for placement.  Patient was supposed to go directly to IR from [place] [facility] rehab facility. IR placed 14French foley catheter successfully. Tube is fully functioning and can be used immediately. Will discharge back to facility. The patient appears reasonably screened and/or stabilized for discharge and I doubt any other medical condition or other [facility] requiring further screening, evaluation, or treatment in the ED at this time prior to discharge. The patient is safe for discharge with strict return precautions discussed with [facility]. Recommend pcp follow up if needed. The patient was discussed with and seen by Dr. Alvino Chapel who agrees with the treatment plan.    Portions of this note were generated with [employer]. Dictation errors may occur despite best attempts at proofreading.    Final Clinical Impressions(s) / ED Diagnoses   Final diagnoses:  Gastrojejunostomy tube dislodgement    ED Discharge Orders    None       Cherre Robins, PA-C 02/20/19 1449    Davonna Belling, MD 02/20/19 1615

## 2019-03-18 ENCOUNTER — Encounter: Payer: Self-pay | Admitting: Internal Medicine

## 2019-03-18 ENCOUNTER — Non-Acute Institutional Stay (SKILLED_NURSING_FACILITY): Payer: Medicare Other | Admitting: Internal Medicine

## 2019-03-18 DIAGNOSIS — E1121 Type 2 diabetes mellitus with diabetic nephropathy: Secondary | ICD-10-CM | POA: Diagnosis not present

## 2019-03-18 DIAGNOSIS — R1310 Dysphagia, unspecified: Secondary | ICD-10-CM | POA: Diagnosis not present

## 2019-03-18 DIAGNOSIS — F39 Unspecified mood [affective] disorder: Secondary | ICD-10-CM

## 2019-03-18 DIAGNOSIS — I10 Essential (primary) hypertension: Secondary | ICD-10-CM

## 2019-03-18 NOTE — Assessment & Plan Note (Signed)
Renal function was last checked in July and had improved. A1c is overdue; but fasting glucoses have ranged from 90-141 indicating excellent control.

## 2019-03-18 NOTE — Assessment & Plan Note (Signed)
Dysphagia unchanged; G-tube is necessary for adequate nutrition.

## 2019-03-18 NOTE — Progress Notes (Signed)
   NURSING HOME LOCATION:  [facility] ROOM NUMBER:  [id number]  CODE STATUS:  Full Code  PCP:  [name], MD  [place] [address]   This is a nursing facility follow up of chronic medical diagnoses.  Interim medical record and care since last [facility] visit was updated with review of diagnostic studies and change in clinical status since last visit were documented.  HPI: He is a permanent resident of the facility with medical diagnoses of prior stroke with residual dysphagia and dysarthria, renal insufficiency, CAD, essential hypertension, GI bleed, dyslipidemia, central sleep apnea and diabetes with vascular complication. The dysphagia has required permanent placement of G-tube.It had become displaced 10/5 & was replaced in IR as stenosis prevented reinsertion in ED. He is a former half a pack a day smoker for over 3 decades.  Review of systems: Patient is nonverbal but can shake his head yes or no.  He seemed to indicate he was having no acute infectious, cardiopulmonary ,GI or GU medical issues.  Physical exam:  Pertinent or positive findings: Facies are blank but he has somewhat of a wide-eyed stare. He appears amazingly well nourished considering his status.  He is Geneticist, molecular.  The teeth are coated with plaque.  G-tube present.  Foley catheter present.  Posterior tibial pulses are stronger than the dorsalis pedis pulses.  The right upper extremity is flexed across his chest and thorax.  He has contractures of both hands.  There is interosseous wasting of the hands.  There was increased tone in the left upper extremity.  There was no range of motion of the left upper extremity or the lower extremities.  General appearance:  no acute distress, increased work of breathing is present.   Lymphatic: No lymphadenopathy about the head, neck, axilla. Eyes: No conjunctival inflammation or lid edema is present. There is no scleral icterus. Ears:  External ear  exam shows no significant lesions or deformities.   Nose:  External nasal examination shows no deformity or inflammation. Nasal mucosa are pink and moist without lesions, exudates Oral exam:  Lips and gums are healthy appearing. There is no oropharyngeal erythema or exudate. Neck:  No thyromegaly, masses, tenderness noted.  Heart:  Normal rate and regular rhythm. S1 and S2 normal without gallop, murmur, click, rub .  Lungs: Chest clear to auscultation without wheezes, rhonchi, rales, rubs. Abdomen: Bowel sounds are normal. Abdomen is soft and nontender with no organomegaly, hernias, masses. GU: Deferred  Extremities:  No cyanosis, clubbing, edema  Skin: Warm & dry w/o tenting. No significant lesions or rash.  See summary under each active problem in the Problem List with associated updated therapeutic plan.

## 2019-03-18 NOTE — Patient Instructions (Signed)
See assessment and plan under each diagnosis in the problem list and acutely for this visit 

## 2019-03-18 NOTE — Assessment & Plan Note (Addendum)
BP essentially @ goal as per Neurology; no change in antihypertensive medications

## 2019-03-19 NOTE — Assessment & Plan Note (Signed)
No behavioral issues reported

## 2019-03-20 ENCOUNTER — Other Ambulatory Visit: Payer: Self-pay

## 2019-03-20 ENCOUNTER — Emergency Department (HOSPITAL_COMMUNITY)
Admission: EM | Admit: 2019-03-20 | Discharge: 2019-03-21 | Disposition: A | Discharge status: Home / Self Care | Payer: Medicare Other | Attending: Emergency Medicine | Admitting: Emergency Medicine

## 2019-03-20 DIAGNOSIS — Z8673 Personal history of transient ischemic attack (TIA), and cerebral infarction without residual deficits: Secondary | ICD-10-CM | POA: Insufficient documentation

## 2019-03-20 DIAGNOSIS — E119 Type 2 diabetes mellitus without complications: Secondary | ICD-10-CM | POA: Insufficient documentation

## 2019-03-20 DIAGNOSIS — Z931 Gastrostomy status: Secondary | ICD-10-CM | POA: Diagnosis not present

## 2019-03-20 DIAGNOSIS — Z87891 Personal history of nicotine dependence: Secondary | ICD-10-CM | POA: Diagnosis not present

## 2019-03-20 DIAGNOSIS — Z431 Encounter for attention to gastrostomy: Secondary | ICD-10-CM | POA: Diagnosis present

## 2019-03-20 DIAGNOSIS — I10 Essential (primary) hypertension: Secondary | ICD-10-CM | POA: Insufficient documentation

## 2019-03-20 NOTE — ED Triage Notes (Signed)
68 yo male BIB [facility] from [place]. Facility called because pt pulled out his G-Tube. Pt has history of dementia.   Vitals: B/p 140/90 Hr 65 rr 24 spo2 99 on room air

## 2019-03-21 ENCOUNTER — Emergency Department (HOSPITAL_COMMUNITY): Payer: Medicare Other

## 2019-03-21 DIAGNOSIS — Z931 Gastrostomy status: Secondary | ICD-10-CM | POA: Diagnosis not present

## 2019-03-21 MED ORDER — IOHEXOL 300 MG/ML  SOLN: 30.0000 mL | Freq: Once | INTRAMUSCULAR | Status: DC | PRN

## 2019-03-21 MED ORDER — IOHEXOL 300 MG/ML  SOLN
30.0000 mL | Freq: Once | INTRAMUSCULAR | Status: AC | PRN
  Administered 2019-03-21: 02:00:00 30 mL

## 2019-03-21 NOTE — ED Notes (Signed)
[facility] contacted for transport and paperwork printed

## 2019-03-21 NOTE — ED Provider Notes (Signed)
Emergency Department Provider Note   I have reviewed the triage vital signs and the nursing notes.   HISTORY  Chief Complaint No chief complaint on file.   HPI Sean Collins is a 68 y.o. male who is nonverbal that presents to the emergency department today secondary to his G-tube being pulled out accidentally.   No other associated or modifying symptoms.   Level 5 caveat secondary to nonverbal. Past Medical History:  Diagnosis Date  . Diabetes mellitus without complication ([place])   . Dysarthria   . Dysphagia   . GI bleed   . Hyperlipidemia   . Hypertension   . Renal insufficiency 09/22/2016  . Stroke [facility])     Patient Active Problem List   Diagnosis Date Noted  . Advance care planning 02/05/2019  . BPH (benign prostatic hyperplasia) 01/27/2019  . Mood disorder ([place]) 01/09/2019  . Central sleep apnea 12/10/2018  . Hospice care patient 11/29/2018  . Palliative care patient 11/29/2018  . History of GI bleed 11/23/2018  . Chronic pain 11/23/2018  . Anxiety 11/23/2018  . Bradycardia 11/13/2018  . Normochromic anemia 09/11/2018  . Adult failure to thrive syndrome 08/06/2018  . Urinary retention 08/01/2018  . Hyperkalemia 07/27/2018  . Acute kidney injury ([place]) 07/27/2018  . Aphasia   . AKI (acute kidney injury) ([place]) 07/06/2018  . Neoplasm of skin of abdomen 04/23/2018  . Thrombocytopenia ([place]) 01/22/2018  . Renal insufficiency 09/22/2016  . Type 2 diabetes mellitus with diabetic nephropathy, without long-term current use of insulin ([place]) 09/13/2016  . Depressive psychosis ([place]) 09/13/2016  . Cervical myofascial pain syndrome 08/29/2016  . Cervical facet joint syndrome 08/29/2016  . Recurrent falls 07/11/2016  . HCAP (healthcare-associated pneumonia) 07/05/2016  . Major depressive disorder, recurrent episode ([place]) 04/17/2016  . Shoulder pain, left 02/07/2016  . Essential hypertension 12/08/2015  . Late effects of CVA (cerebrovascular accident)  12/08/2015  . Cerebrovascular accident (CVA) due to thrombosis of right middle cerebral artery ([place])   . Slurred speech   . Malnutrition of moderate degree 11/04/2015  . Left hemiparesis ([place])   . Dysphagia   . Hyperlipidemia 11/16/2011  . Gout 11/16/2011  . History of stroke 11/16/2011  . CAD (coronary artery disease) 11/16/2011    Past Surgical History:  Procedure Laterality Date  . ESOPHAGOGASTRODUODENOSCOPY (EGD) WITH PROPOFOL N/A 11/03/2015   Procedure: ESOPHAGOGASTRODUODENOSCOPY (EGD) WITH PROPOFOL;  Surgeon: Manus Gunning, MD;  Location: [facility];  Service: Gastroenterology;  Laterality: N/A;  . IR REPLACE G-TUBE SIMPLE WO FLUORO  10/22/2018  . IR REPLACE G-TUBE SIMPLE WO FLUORO  02/20/2019  . IR REPLC GASTRO/COLONIC TUBE PERCUT W/FLUORO  12/29/2016  . IR REPLC GASTRO/COLONIC TUBE PERCUT W/FLUORO  08/04/2018    Current Outpatient Rx  . Order #: [id number] Class: Historical Med  . Order #: [id number] Class: Historical Med  . Order #: [id number] Class: Historical Med  . Order #: [id number] Class: Historical Med  . Order #: [id number] Class: Historical Med  . Order #: [id number] Class: Historical Med  . Order #: [id number] Class: Historical Med  . Order #: [id number] Class: Historical Med  . Order #: [id number] Class: Historical Med  . Order #: [id number] Class: Historical Med  . Order #: [id number] Class: Historical Med  . Order #: [id number] Class: Historical Med  . Order #: [id number] Class: Historical Med  . Order #: [id number] Class: Historical Med  . Order #: [id number] Class: Historical Med  . Order #: [id number] Class: Historical Med  . Order #: [id number] Class: Historical Med  . Order #: [id number] Class: Normal  . Order #: [id number] Class:  Normal  . Order #: [id number] Class: Historical Med    Allergies Scopolamine  Family History  Problem Relation Age of Onset  . Stroke Brother   . Diabetes Brother   . Stroke Brother   . Stroke Maternal Aunt     Social History Social  History   Tobacco Use  . Smoking status: Former Smoker    Packs/day: 0.50    Years: 32.00    Pack years: 16.00    Types: Cigarettes  . Smokeless tobacco: Never Used  . Tobacco comment: Started at age 72 though quit for 10 years at one point  Substance Use Topics  . Alcohol use: No    Alcohol/week: 0.0 standard drinks  . Drug use: No    Review of Systems  Level 5 caveat secondary to nonverbal. ____________________________________________   PHYSICAL EXAM:  VITAL SIGNS: ED Triage Vitals [03/20/19 2321]  Enc Vitals Group     BP (!) 147/88     Pulse Rate (!) 58     Resp 18     Temp 98.3 F (36.8 C)     Temp Source Oral     SpO2 98 %    Constitutional: Alert. Well appearing and in no acute distress. Eyes: Conjunctivae are normal. PERRL. EOMI. Head: Atraumatic. Nose: No congestion/rhinnorhea. Mouth/Throat: Mucous membranes are moist.  Oropharynx non-erythematous. Neck: No stridor.  No meningeal signs.   Cardiovascular: Normal rate, regular rhythm. Good peripheral circulation. Grossly normal heart sounds.   Respiratory: Normal respiratory effort.  No retractions. Lungs CTAB. Gastrointestinal: Soft and nontender. No distention. g tube site c/d. Musculoskeletal: No lower extremity tenderness nor edema. No gross deformities of extremities. Neurologic:  Normal speech and language. No gross focal neurologic deficits are appreciated.  Skin:  Skin is warm, dry and intact. No rash noted.  ____________________________________________   LABS (all labs ordered are listed, but only abnormal results are displayed)  Labs Reviewed - No data to display ____________________________________________  RADIOLOGY  Dg Abd 1 View  Result Date: 03/21/2019 CLINICAL DATA:  Check peg placement EXAM: ABDOMEN - 1 VIEW COMPARISON:  08/04/2018 FINDINGS: Contrast material is injected through the indwelling gastrostomy catheter. Catheter lies within the stomach. The balloon is not well  appreciated on this exam. IMPRESSION: Gastrostomy catheter within the stomach. Electronically Signed   By: Inez Catalina M.D.   On: 03/21/2019 02:55    ____________________________________________   PROCEDURES  Procedure(s) performed:   Gastrostomy tube replacement  Date/Time: 03/22/2019 2:46 AM Performed by: Merrily Pew, MD Authorized by: Merrily Pew, MD  Consent: The procedure was performed in an emergent situation. Required items: required blood products, implants, devices, and special equipment available Time out: Immediately prior to procedure a "time out" was called to verify the correct patient, procedure, equipment, support staff and site/side marked as required. Preparation: Patient was prepped and draped in the usual sterile fashion. Local anesthesia used: no  Anesthesia: Local anesthesia used: no  Sedation: Patient sedated: no  Patient tolerance: patient tolerated the procedure well with no immediate complications Comments: Foley catheter used in place of G tube as correct one could not be located in hospital.    ____________________________________________   INITIAL IMPRESSION / ASSESSMENT AND PLAN / ED COURSE  We will attempt to replace G-tube.  It appears that last time he had to have IR to do it with a dilator and guidewire.  If G-tube is unsuccessful we will try Foley.  Appropriate size G-tube could not be located only 24 [place] as were found  in the hospital apparently.  A 14 French Foley catheter was utilized instead.  Will need to return tomorrow for IR to replace.     Pertinent labs & imaging results that were available during my care of the patient were reviewed by me and considered in my medical decision making (see chart for details).  A medical screening exam was performed and I feel the patient has had an appropriate workup for their chief complaint at this time and likelihood of emergent condition existing is low. They have been counseled on  decision, discharge, follow up and which symptoms necessitate immediate return to the emergency department. They or their family verbally stated understanding and agreement with plan and discharged in stable condition.   ____________________________________________  FINAL CLINICAL IMPRESSION(S) / ED DIAGNOSES  Final diagnoses:  Gastrostomy tube in place [facility])     MEDICATIONS GIVEN DURING THIS VISIT:  Medications  iohexol (OMNIPAQUE) 300 MG/ML solution 30 mL (30 mLs Per Tube Contrast Given 03/21/19 0222)     NEW OUTPATIENT MEDICATIONS STARTED DURING THIS VISIT:  Discharge Medication List as of 03/21/2019  3:58 AM      Note:  This note was prepared with assistance of Dragon voice recognition software. Occasional wrong-word or sound-a-like substitutions may have occurred due to the inherent limitations of voice recognition software.   Sowmya Partridge, Corene Cornea, MD 03/22/19 [phone]

## 2019-03-21 NOTE — ED Notes (Signed)
Attempted to call [facility] to call report for pts return, phone call was not answered. Will try again.

## 2019-03-21 NOTE — ED Notes (Signed)
Again attempted to call report to facility without answer.

## 2019-03-21 NOTE — Discharge Instructions (Addendum)
You can use the catheter in place for feeds but he needs it changed out to a G tube wherever he has had it done in the past. He DOES NOT  need to come back to the Emergency room for the exchange, he just needs to make an appointment for outpatient.

## 2019-03-24 ENCOUNTER — Other Ambulatory Visit (HOSPITAL_COMMUNITY): Payer: Self-pay | Admitting: Interventional Radiology

## 2019-03-24 DIAGNOSIS — K9420 Gastrostomy complication, unspecified: Secondary | ICD-10-CM

## 2019-03-26 ENCOUNTER — Other Ambulatory Visit: Payer: Self-pay

## 2019-03-26 ENCOUNTER — Encounter (HOSPITAL_COMMUNITY): Payer: Self-pay | Admitting: Interventional Radiology

## 2019-03-26 ENCOUNTER — Ambulatory Visit (HOSPITAL_COMMUNITY)
Admission: RE | Admit: 2019-03-26 | Discharge: 2019-03-26 | Disposition: A | Discharge status: Home / Self Care | Payer: Medicare Other | Source: Ambulatory Visit | Attending: Interventional Radiology | Admitting: Interventional Radiology

## 2019-03-26 DIAGNOSIS — K9423 Gastrostomy malfunction: Secondary | ICD-10-CM | POA: Diagnosis present

## 2019-03-26 DIAGNOSIS — K9420 Gastrostomy complication, unspecified: Secondary | ICD-10-CM

## 2019-03-26 HISTORY — PX: IR REPLC GASTRO/COLONIC TUBE PERCUT W/FLUORO: IMG2333

## 2019-03-26 MED ORDER — LIDOCAINE HCL 1 % IJ SOLN
INTRAMUSCULAR | Status: AC
  Filled 2019-03-26: qty 20

## 2019-03-26 MED ORDER — LIDOCAINE VISCOUS HCL 2 % MT SOLN
OROMUCOSAL | Status: DC | PRN
  Administered 2019-03-26: 15 mL via OROMUCOSAL

## 2019-03-26 MED ORDER — IOHEXOL 300 MG/ML  SOLN
50.0000 mL | Freq: Once | INTRAMUSCULAR | Status: AC | PRN
  Administered 2019-03-26: 15 mL

## 2019-03-26 MED ORDER — LIDOCAINE VISCOUS HCL 2 % MT SOLN
OROMUCOSAL | Status: AC
  Filled 2019-03-26: qty 15

## 2019-03-26 MED ORDER — LIDOCAINE HCL 1 % IJ SOLN
INTRAMUSCULAR | Status: DC | PRN
  Administered 2019-03-26: 10 mL

## 2019-03-26 NOTE — Procedures (Signed)
Interventional Radiology Procedure Note  Procedure: replacement of the existing foley catheter for a new 15F percutaneous balloon retention gastrostomy tube.  Dilation performed to 4F.     Complications: None  Recommendations:  - Ok to use  Signed,  Dulcy Fanny. Earleen Newport, DO

## 2019-04-10 ENCOUNTER — Encounter: Payer: Self-pay | Admitting: Internal Medicine

## 2019-04-10 ENCOUNTER — Non-Acute Institutional Stay (SKILLED_NURSING_FACILITY): Payer: Medicare Other | Admitting: Internal Medicine

## 2019-04-10 DIAGNOSIS — N489 Disorder of penis, unspecified: Secondary | ICD-10-CM | POA: Diagnosis not present

## 2019-04-10 NOTE — Patient Instructions (Signed)
See assessment and plan under each diagnosis in the problem list and acutely for this visit 

## 2019-04-10 NOTE — Assessment & Plan Note (Addendum)
Wound Care Nurse monitor for appearance of cellulitis or abscess.  This has the appearance of prior abscess or fistula which has healed with granulomatous charactetr.  If there is progression of the lesion or impact on Foley function, Urology consultation will be pursued.

## 2019-04-10 NOTE — Progress Notes (Signed)
NURSING HOME LOCATION:  [facility] ROOM NUMBER: 111 A  CODE STATUS: Full Code    PCP:  Pascal Lux MD      This is a nursing facility follow up for specific acute issue of penile lesion of ventral shaft.  Interim medical record and care since last [place] visit was updated with review of diagnostic studies and change in clinical status since last visit were documented.  HPI: On 12/1 his nurse noted some swelling of the penis.  [facility] NP checked the patient 12/2 and reported localized swelling of the ventral shaft proximal to the frenulum.  He has a permanent Foley catheter which appeared to be functioning.  Urine was yellow without evidence of hematuria or pyuria.  The patient denied any pain in this area.  Apparently he had been noted to be pulling on the Foley catheter 12/1 as per the CNAs.  [facility] NP advanced the Foley without any evidence of pain or obstruction. He has a past history of renal insufficiency with CKD stage III.  In July creatinine was 1.65 and GFR 48.  He also has a history of BPH.   He has dysphagia, dysarthria and left hemiparesis in the setting of prior stroke.  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   His diabetes is complicated by vascular comorbidities.  Other diagnoses include CAD, essential hypertension, central sleep apnea, anxiety/depression and dyslipidemia.   He has a permanent PEG; he was seen in the emergency room 11/12 for accidental displacement of the G-tube.  This was replaced 11/14 with a Foley catheter as a G-tube of the correct size could not be obtained.  Interventional radiology replaced the Foley with a G-tube under fluoroscopy 11/18.  Review of systems: Could not be completed due to nonverbal state.  He would shake his head yes or no but the responses were inconsistent as was his ability to follow commands.  For instance he did take deep breaths upon request but would not oppose my hand to test strength  in the extremities.  Physical exam:  Pertinent or positive findings: He exhibits a wide-eyed stare.  There is intermittent exotropia of the right eye.  Mouth is agape; teeth are severely plaque coated.  He has mild diffuse rhonchi.  Heart sounds are distant.  PEG tube is present .  Foley catheter is present.  There is an irregular granulomatous type subcutaneous changes on the ventral penal shaft proximal to the penile head.  There appeared to be a pinpoint deficit in the center of this lesion.  There was no evidence of cellulitis or discharge.  Clinically granuloma formation related to prior abscess or fistula was suggested.  On repeated questioning there was no tenderness to compression.  He has testicular atrophy.  There is no inguinal lymphadenopathy.  Left hemiparesis is present.  Posterior tibial pulses are stronger than the dorsalis pedis pulses.  There is interosseous wasting of the hands.  Toenails are thickened.  General appearance:  no acute distress, increased work of breathing is present.   Lymphatic: No lymphadenopathy about the head, neck, axilla. Eyes: No conjunctival inflammation or lid edema is present. There is no scleral icterus. Ears:  External ear exam shows no significant lesions or deformities.   Nose:  External nasal examination shows no deformity or inflammation. Nasal mucosa are pink and moist without lesions, exudates Neck:  No thyromegaly, masses, tenderness noted.    Heart:  No gallop, murmur, click, rub .  Lungs:  without wheezes,  rales, rubs. Abdomen: Bowel sounds are normal. Abdomen is soft and nontender with no organomegaly, hernias, masses. Extremities:  No cyanosis, clubbing, edema  Neurologic exam : Balance, Rhomberg, finger to nose testing could not be completed due to clinical state Skin: Warm & dry w/o tenting. No significant  rash.  See summary under each active problem in the Problem List with associated updated therapeutic plan

## 2019-04-22 ENCOUNTER — Non-Acute Institutional Stay (SKILLED_NURSING_FACILITY): Payer: Medicare Other | Admitting: Internal Medicine

## 2019-04-22 ENCOUNTER — Encounter: Payer: Self-pay | Admitting: Internal Medicine

## 2019-04-22 DIAGNOSIS — N342 Other urethritis: Secondary | ICD-10-CM | POA: Diagnosis not present

## 2019-04-22 DIAGNOSIS — N489 Disorder of penis, unspecified: Secondary | ICD-10-CM | POA: Diagnosis not present

## 2019-04-22 NOTE — Patient Instructions (Addendum)
See assessment and plan under acute diagnoses for this visit

## 2019-04-22 NOTE — Progress Notes (Signed)
   NURSING HOME LOCATION:  [facility] ROOM NUMBER: 111 A  CODE STATUS: Full code  PCP:  W.F. Rozlynn Lippold MD  This is a nursing facility follow up for specific acute issue of penile lesion.  Interim medical record and care since last [place] visit was updated with review of diagnostic studies and change in clinical status since last visit were documented.  HPI: The  [facility]  NP requested I follow up on the penile lesion which has been reported to have enlarged since seen by me 12/3. The patient can provide no history.  On exam it originally appeared to be granulomatous changes related to prior abscess or fistula.  There has been no impact on urine flow through the Foley. Today his nurse states that there is definite increased swelling of the frenulum as well as possibly  the head of the penis. Slight pus has been noted to emanate from the urethral orifice.  Again there is no impairment of urine flow clinically.  Review of systems: Aphasia prevents completion of review of systems.  He seems to indicate some discomfort in the suprapubic/groin area.  His responses are almost a growled, indecipherable eneunciation as an animal in pain.  Physical exam:  Pertinent or positive findings: Affect as noted.  He had the corner of the pillow clenched between his upper and lower teeth at the onset of the exam.  Heart sounds cannot be auscultated.  His grunted vocalizations created harsh rales & rhonchi anteriorly.  Bowel sounds are present.PEG present.  The inferior frenulum is swollen.  Proximal to the frenulum is an irregular granulomatous change which is nontender to palpation.  There is no fluctuance.  Staff reports purulent drainage from the penis around the [facility] was visualized @ my exam.  Slight heme tinge to urine was present. Left hemiparesis is present.  Diffuse muscle wasting in all extremities.  General appearance:  no acute distress, increased work of breathing is present (see O2  sats).   Lymphatic: No lymphadenopathy about the head, neck, inguinal areas. Eyes: No conjunctival inflammation or lid edema is present. There is no scleral icterus. Ears:  External ear exam shows no significant lesions or deformities.   Nose:  External nasal examination shows no deformity or inflammation. Nasal mucosa are pink and moist without lesions, exudates Neck:  No thyromegaly, masses, tenderness noted.    Abdomen: Bowel sounds are normal. Abdomen is soft and nontender with no organomegaly, hernias, masses. Extremities:  No cyanosis, clubbing, edema  Neurologic exam : Strength in upper & lower extremities & balance, Rhomberg, finger to nose testing could not be completed due to clinical state Skin: Warm & dry w/o tenting. No significant lesions or rash.  See summary under acute problem with associated updated therapeutic plan

## 2019-04-23 ENCOUNTER — Encounter: Payer: Self-pay | Admitting: Internal Medicine

## 2019-04-23 NOTE — Assessment & Plan Note (Addendum)
No change on palpation vs 12/3; but now purulence & slight hematuria reported suggesting urethritis. Hesitate to remove Foley as functioning & replacement likely would require emergent Urology intervention but it may be nidus for infection.

## 2019-05-15 ENCOUNTER — Non-Acute Institutional Stay (SKILLED_NURSING_FACILITY): Payer: Medicare Other | Admitting: Internal Medicine

## 2019-05-15 ENCOUNTER — Encounter: Payer: Self-pay | Admitting: Internal Medicine

## 2019-05-15 DIAGNOSIS — I10 Essential (primary) hypertension: Secondary | ICD-10-CM | POA: Diagnosis not present

## 2019-05-15 DIAGNOSIS — J189 Pneumonia, unspecified organism: Secondary | ICD-10-CM | POA: Diagnosis not present

## 2019-05-15 DIAGNOSIS — N489 Disorder of penis, unspecified: Secondary | ICD-10-CM

## 2019-05-15 NOTE — Assessment & Plan Note (Addendum)
04/29/2019 mobile imaging performed for abnormal breath sounds was interpreted as showing bilateral pneumonia.  He was afebrile and exhibited no purulent sputum production.  Films personally reviewed by me revealed vascular accentuation suggesting early pulmonary edema without pneumonia.  Antibiotics initially ordered were discontinued and furosemide initiated.  Additionally pulmonary toilet regimen ordered.

## 2019-05-15 NOTE — Assessment & Plan Note (Signed)
Blood pressure control is at recommended range.  No change indicated

## 2019-05-15 NOTE — Assessment & Plan Note (Addendum)
Subcutaneous granulomatous changes w/o cellulitis or abscess are suggested. ? Possibly related to fistula formation due to recurrent catheterizations. Urology consultation pending.

## 2019-05-15 NOTE — Progress Notes (Signed)
  NURSING HOME LOCATION:  [facility] ROOM NUMBER:  116-A  CODE STATUS:  FULL CODE   PCP:  [name], MD  [place] [id number]   This is a nursing facility follow up of chronic medical diagnoses.  Interim medical record and care since last [place] visit was updated with review of diagnostic studies and change in clinical status since last visit were documented.  HPI: He is a permanent resident of the facility with medical diagnoses of essential hypertension, history of stroke complicated by left hemiparesis, coronary disease, diabetes with nephropathy, CKD, dyslipidemia, central sleep apnea, and gout. On 12/22 he exhibited abnormal breath sounds. Portable imaging was interpreted as showing bilateral pneumonia.  Augmentin and Flagyl were ordered for possible aspiration pneumonia.  The films were personally reviewed by me.  Vascular accentuation suggesting early pulmonary edema was present.  I could visualize no pneumonia in either lung.  Low-dose Lasix was initiated with pulmonary toilet regimen. He has recently been evaluated for a lesion of the shaft of the penis.  This was complicated by purulent urethritis for which he received antibiotics with resolution of the purulent discharge.  The lesion appears to be stable without evidence of active cellulitis.  Urology consultation is pending.  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  Review of systems: The patient is aphasic and cannot provide history.  He does intermittently exhibit a grunting "growl", suggesting an almost Music therapist.   Physical exam:  Pertinent or positive findings: As noted there is no intelligible vocalization.  He has pattern alopecia.  Hair is wispy.  OS exotropia is subtly present.  As usual, he has a towel in his mouth. Heart sounds are distant. He does have minor rales; but the chest is actually surprisingly clearer than expected based  on findings which prompted the mobile imaging.  Abdomen is protuberant.  G-tube present.  Foley catheter present.  The subcutaneous granulomatous changes on the ventral penis appear smaller and exhibits no cellulitis or fluctuance.  He seems to indicate that it is nontender to palpation or compression.  Left hemiparesis is present.  Left upper extremity is in a splint.  He has marked interosseous wasting.  Posterior tibial pulses are stronger than the dorsalis pedis pulses.  General appearance: no acute distress, increased work of breathing is present.   Lymphatic: No lymphadenopathy about the head, neck, axilla. Eyes: No conjunctival inflammation or lid edema is present. There is no scleral icterus. Ears:  External ear exam shows no significant lesions or deformities.   Nose:  External nasal examination shows no deformity or inflammation. Nasal mucosa are pink and moist without lesions, exudates Neck:  No thyromegaly, masses, tenderness,NVD noted.    Heart:  No gallop, murmur, click, rub .  Lungs:  without wheezes, rhonchi,rubs. Abdomen: Bowel sounds are normal. Abdomen is soft and nontender with no organomegaly, hernias, masses. GU: Deferred  Extremities:  No cyanosis, clubbing, edema  Neurologic exam : Balance, Rhomberg, finger to nose testing could not be completed due to clinical state Skin: Warm & dry w/o tenting. No significant lesions.  See summary under each active problem in the Problem List with associated updated therapeutic plan

## 2019-05-15 NOTE — Patient Instructions (Signed)
See assessment and plan under each diagnosis in the problem list and acutely for this visit 

## 2019-05-21 ENCOUNTER — Encounter: Payer: Self-pay | Admitting: Internal Medicine

## 2019-06-16 ENCOUNTER — Emergency Department (HOSPITAL_COMMUNITY)
Admission: EM | Admit: 2019-06-16 | Discharge: 2019-07-07 | Disposition: E | Payer: No Typology Code available for payment source | Attending: Emergency Medicine | Admitting: Emergency Medicine

## 2019-06-16 ENCOUNTER — Emergency Department (HOSPITAL_COMMUNITY): Payer: No Typology Code available for payment source

## 2019-06-16 DIAGNOSIS — I469 Cardiac arrest, cause unspecified: Secondary | ICD-10-CM | POA: Diagnosis not present

## 2019-06-16 DIAGNOSIS — U071 COVID-19: Secondary | ICD-10-CM | POA: Insufficient documentation

## 2019-06-16 DIAGNOSIS — J9601 Acute respiratory failure with hypoxia: Secondary | ICD-10-CM | POA: Diagnosis not present

## 2019-06-16 DIAGNOSIS — R0609 Other forms of dyspnea: Secondary | ICD-10-CM | POA: Diagnosis present

## 2019-06-16 DIAGNOSIS — A419 Sepsis, unspecified organism: Secondary | ICD-10-CM

## 2019-06-16 DIAGNOSIS — R6521 Severe sepsis with septic shock: Secondary | ICD-10-CM | POA: Diagnosis not present

## 2019-06-16 MED ORDER — VANCOMYCIN HCL 1500 MG/300ML IV SOLN
1500.0000 mg | Freq: Once | INTRAVENOUS | Status: DC
  Filled 2019-06-16: qty 300

## 2019-06-16 MED ORDER — SUCCINYLCHOLINE CHLORIDE 20 MG/ML IJ SOLN
INTRAMUSCULAR | Status: AC | PRN
  Administered 2019-06-16: 150 mg via INTRAVENOUS

## 2019-06-16 MED ORDER — VANCOMYCIN HCL IN DEXTROSE 1-5 GM/200ML-% IV SOLN: 1000.0000 mg | Freq: Once | INTRAVENOUS | Status: DC

## 2019-06-16 MED ORDER — SODIUM CHLORIDE 0.9 % IV SOLN: 2.0000 g | Freq: Once | INTRAVENOUS | Status: DC

## 2019-06-16 MED ORDER — ETOMIDATE 2 MG/ML IV SOLN
INTRAVENOUS | Status: AC | PRN
  Administered 2019-06-16: 20 mg via INTRAVENOUS

## 2019-06-16 MED ORDER — PROPOFOL 1000 MG/100ML IV EMUL
INTRAVENOUS | Status: AC
  Filled 2019-06-16: qty 100

## 2019-06-16 MED ORDER — SODIUM CHLORIDE 0.9 % IV BOLUS
2500.0000 mL | Freq: Once | INTRAVENOUS | Status: AC
  Administered 2019-06-17: 2500 mL via INTRAVENOUS

## 2019-06-16 NOTE — ED Triage Notes (Signed)
Came in via ems; reported patient is Covid + and NH staff noted increased respiration rate the last hour;  sp02 low 80s on supplemental o2.

## 2019-06-16 NOTE — ED Provider Notes (Signed)
[facility] EMERGENCY DEPARTMENT Provider Note   CSN: [id number] Arrival date & time: 06/27/2019  2304     History Chief Complaint  Patient presents with  . Shortness of Breath    Sean Collins is a 69 y.o. male.  HPI     This is a 69 year old male with a history of diabetes, stroke with left-sided hemiplegia, hypertension, hyperlipidemia who presents from [facility] rehab with respiratory distress.  Per EMS, was noted to be significantly tachypneic by staff.  This occurred approximately 1 hour ago.  Recent diagnosis of Covid over the last week and had been quarantined in the facility.  There is some report that he may have been given something to eat and may have aspirated.  At baseline he has a G-tube.  Per EMS he was noted to be tachypneic with a respiratory rate in the 50s.  He also had O2 sats in the mid 80s on 2 L upon their arrival.  They placed him on a nonrebreather.  They did have 1 low blood pressure in route.  Unsure of whether he has had fevers.  Patient does not contribute to history taking.  Level 5 caveat for acuity of condition and nonverbal.  I did speak with the patient's brother.  I have reviewed his paperwork.  It indicates that he is full code.  Brother confirms that he is full code.  I discussed with him that given his respiratory status, he would likely require intubation.  Given his medical corpsman rhytides, he may not do well following intubation.  Brother is aware and confirms CODE STATUS.  Past Medical History:  Diagnosis Date  . Diabetes mellitus without complication ([place])   . Dysarthria   . Dysphagia   . GI bleed   . Hyperlipidemia   . Hypertension   . Renal insufficiency 09/22/2016  . Stroke [facility])     Patient Active Problem List   Diagnosis Date Noted  . Penile lesion 04/10/2019  . Advance care planning 02/05/2019  . BPH (benign prostatic hyperplasia) 01/27/2019  . Mood disorder ([place]) 01/09/2019  . Central sleep apnea  12/10/2018  . Hospice care patient 11/29/2018  . Palliative care patient 11/29/2018  . History of GI bleed 11/23/2018  . Chronic pain 11/23/2018  . Anxiety 11/23/2018  . Bradycardia 11/13/2018  . Normochromic anemia 09/11/2018  . Adult failure to thrive syndrome 08/06/2018  . Urinary retention 08/01/2018  . Hyperkalemia 07/27/2018  . Acute kidney injury ([place]) 07/27/2018  . Aphasia   . AKI (acute kidney injury) ([place]) 07/06/2018  . Neoplasm of skin of abdomen 04/23/2018  . Thrombocytopenia ([place]) 01/22/2018  . Renal insufficiency 09/22/2016  . Type 2 diabetes mellitus with diabetic nephropathy, without long-term current use of insulin ([place]) 09/13/2016  . Depressive psychosis ([place]) 09/13/2016  . Cervical myofascial pain syndrome 08/29/2016  . Cervical facet joint syndrome 08/29/2016  . Recurrent falls 07/11/2016  . HCAP (healthcare-associated pneumonia) 07/05/2016  . Major depressive disorder, recurrent episode ([place]) 04/17/2016  . Shoulder pain, left 02/07/2016  . Essential hypertension 12/08/2015  . Late effects of CVA (cerebrovascular accident) 12/08/2015  . Cerebrovascular accident (CVA) due to thrombosis of right middle cerebral artery ([place])   . Slurred speech   . Malnutrition of moderate degree 11/04/2015  . Left hemiparesis ([place])   . Dysphagia   . Hyperlipidemia 11/16/2011  . Gout 11/16/2011  . History of stroke 11/16/2011  . CAD (coronary artery disease) 11/16/2011    Past Surgical History:  Procedure Laterality  Date  . ESOPHAGOGASTRODUODENOSCOPY (EGD) WITH PROPOFOL N/A 11/03/2015   Procedure: ESOPHAGOGASTRODUODENOSCOPY (EGD) WITH PROPOFOL;  Surgeon: Manus Gunning, MD;  Location: [place];  Service: Gastroenterology;  Laterality: N/A;  . IR REPLACE G-TUBE SIMPLE WO FLUORO  10/22/2018  . IR REPLACE G-TUBE SIMPLE WO FLUORO  02/20/2019  . IR REPLC GASTRO/COLONIC TUBE PERCUT W/FLUORO  12/29/2016  . IR REPLC GASTRO/COLONIC TUBE PERCUT W/FLUORO  08/04/2018  . IR  REPLC GASTRO/COLONIC TUBE PERCUT W/FLUORO  03/26/2019       Family History  Problem Relation Age of Onset  . Stroke Brother   . Diabetes Brother   . Stroke Brother   . Stroke Maternal Aunt     Social History   Tobacco Use  . Smoking status: Former Smoker    Packs/day: 0.50    Years: 32.00    Pack years: 16.00    Types: Cigarettes  . Smokeless tobacco: Never Used  . Tobacco comment: Started at age 59 though quit for 10 years at one point  Substance Use Topics  . Alcohol use: No    Alcohol/week: 0.0 standard drinks  . Drug use: No    Home Medications Prior to Admission medications   Medication Sig Start Date End Date Taking? Authorizing Provider  acetaminophen (TYLENOL) 325 MG tablet Place 650 mg into feeding tube 4 (four) times daily.     [provider]  allopurinol (ZYLOPRIM) 150 mg TABS tablet Place 150 mg into feeding tube daily.    [provider]  amLODipine (NORVASC) 5 MG tablet Place 5 mg into feeding tube daily.     [provider]  atenolol (TENORMIN) 25 MG tablet Take 25 mg by mouth every morning. Hold for SBP less than 104 or heart rate less than 60    [provider]  atorvastatin (LIPITOR) 80 MG tablet Place 80 mg into feeding tube at bedtime.     [provider]  bisacodyl (DULCOLAX) 10 MG suppository Place 10 mg rectally as needed for moderate constipation.    [provider]  Carboxymethylcellulose Sodium (REFRESH LIQUIGEL) 1 % GEL Place 1 drop into both eyes 3 (three) times daily.     [provider]  chlorhexidine (PERIDEX) 0.12 % solution Use as directed 5 mLs in the mouth or throat every evening. Brush to teeth and gums QPM after PM mouth care. Spit out excess and do not rinse.    [provider]  doxazosin (CARDURA) 1 MG tablet Place 1 mg into feeding tube daily.     [provider]  guaiFENesin (ROBAFEN MUCUS/CHEST CONGESTION) 100 MG/5ML liquid Place 200 mg into feeding tube  4 (four) times daily.     [provider]  LORazepam (ATIVAN) 0.5 MG tablet Place 0.5 mg into feeding tube every morning.  02/17/19   [provider]  magnesium hydroxide (MILK OF MAGNESIA) 400 MG/5ML suspension Take 30 mLs by mouth daily as needed for mild constipation.    [provider]  Menthol, Topical Analgesic, (BIOFREEZE) 4 % GEL Apply thin layer to bilat hands for pain    [provider]  Nutritional Supplements (NUTRITIONAL SUPPLEMENT PO) Place 80 mL/hr into feeding tube continuous. Diabeta Source    [provider]  OXYGEN Inhale 2 L into the lungs See admin instructions. 2 liters of oxygen as needed for hypoxia and KEEP SAT AT >90%    [provider]  QUEtiapine (SEROQUEL) 25 MG tablet Place 25 mg into feeding tube 3 (three) times  daily.     [provider]  Sodium Phosphates (RA SALINE ENEMA) 19-7 GM/118ML ENEM Place 1 each rectally as needed (for constipation).    [provider]  traMADol (ULTRAM) 50 MG tablet Take 1 tablet (50 mg total) by mouth every 12 (twelve) hours. 02/10/19   Medina-Vargas, Monina C, NP  traMADol (ULTRAM) 50 MG tablet Take by mouth every 12 (twelve) hours as needed for moderate pain.    [provider]  valproic acid (DEPAKENE) 250 MG/5ML solution Place 125 mg into feeding tube at bedtime.    [provider]    Allergies    Scopolamine  Review of Systems   Review of Systems  Unable to perform ROS: Acuity of condition    Physical Exam Updated Vital Signs BP (!) 77/58   Pulse 68   Resp 18   Ht 1.727 m (5\' 8" )   SpO2 (!) 85%   BMI 26.27 kg/m   Physical Exam Vitals and nursing note reviewed.  Constitutional:      Comments: Acutely ill-appearing, nonverbal, significantly tachypneic with increased work of breathing  HENT:     Head: Normocephalic and atraumatic.     Mouth/Throat:     Comments: Mucous membranes dry Eyes:     Pupils: Pupils are equal, round,  and reactive to light.  Cardiovascular:     Comments: Tachycardia, no murmur noted Pulmonary:     Comments: Tachypnea, increased respiratory effort, Rales in all lung fields, accessory muscle use Abdominal:     Palpations: Abdomen is soft.     Comments: G-tube in place.  Musculoskeletal:     Cervical back: Neck supple.     Right lower leg: No edema.     Left lower leg: No edema.     Comments: Contracture left upper extremity  Skin:    General: Skin is warm.  Neurological:     Comments: Unable to assess orientation, intermittently agitated, noted to spontaneously move right side  Psychiatric:     Comments: Unable to assess     ED Results / Procedures / Treatments   Labs (all labs ordered are listed, but only abnormal results are displayed) Labs Reviewed  LACTIC ACID, PLASMA - Abnormal; Notable for the following components:      Result Value   Lactic Acid, Venous 9.9 (*)    All other components within normal limits  CULTURE, BLOOD (ROUTINE X 2)  CULTURE, BLOOD (ROUTINE X 2)  URINE CULTURE  RESPIRATORY PANEL BY RT PCR (FLU A&B, COVID)  LACTIC ACID, PLASMA  COMPREHENSIVE METABOLIC PANEL  CBC WITH DIFFERENTIAL/PLATELET  URINALYSIS, ROUTINE W REFLEX MICROSCOPIC  CBC WITH DIFFERENTIAL/PLATELET  POC SARS CORONAVIRUS 2 AG -  ED    EKG EKG Interpretation  Date/Time:  Monday June 16 2019 23:24:17 EST Ventricular Rate:  146 PR Interval:    QRS Duration: 99 QT Interval:  299 QTC Calculation: 423 R Axis:   -33 Text Interpretation: Sinus l tachycardia Atrial premature complexes Abnormal R-wave progression, early transition LVH with secondary repolarization abnormality Nonspecific T wave inversions Confirmed by Thayer Jew [phone]) on 06/15/2019 11:49:11 PM   EKG Interpretation  Date/Time:  18-Jun-2019 00:59:39 EST Ventricular Rate:  40 PR Interval:    QRS Duration: 159 QT Interval:  499 QTC Calculation: 407 R Axis:   77 Text Interpretation: Sinus  bradycardia Ventricular premature complex Prolonged PR interval Vent pre-excit'n(WPW), right access'y pathway Significantly bradycardic when compared to prior Confirmed by Thayer Jew [phone]) on 06/18/2019 1:26:26  AM        Radiology DG Chest Port 1 View  Result Date: 11-Jul-2019 CLINICAL DATA:  Hypoxia, COVID-19 positive EXAM: PORTABLE CHEST 1 VIEW COMPARISON:  Radiograph 07/27/2018 FINDINGS: Lung volumes are low. Streaky basilar atelectatic changes are present bases most pronounced in the left lower lung. Some intermixed areas of more patchy airspace opacity are noted in the lung bases as well. No pneumothorax. No visible effusion. Cardiomediastinal contours are similar to prior with a calcified aorta. Central vascular crowding is noted with mild pulmonary venous congestion. A transesophageal tube side port is within the lower thoracic esophagus and should be advanced at least 8 cm to position within the stomach. Endotracheal tube terminates in the mid trachea, 4.1 cm from the carina. Telemetry leads overlie the chest. No acute osseous or soft tissue abnormality. IMPRESSION: 1. Endotracheal tube terminates in the mid trachea, 4.1 cm from the carina. 2. A transesophageal tube side port is within the lower thoracic esophagus and should be advanced at least 8 cm to position within the stomach. 3. Low lung volumes with bibasilar atelectatic changes. Interspersed areas of patchy airspace disease could reflect further atelectatic change or infection. 4. Hilar prominence is likely related to the vascular crowding though adenopathy could present similarly. Electronically Signed   By: Lovena Le M.D.   On: July 11, 2019 00:09    Procedures .Critical Care Performed by: Merryl Hacker, MD Authorized by: Merryl Hacker, MD   Critical care provider statement:    Critical care time (minutes):  71   Critical care time was exclusive of:  Separately billable procedures and treating other patients and  teaching time   Critical care was necessary to treat or prevent imminent or life-threatening deterioration of the following conditions:  Respiratory failure   Critical care was time spent personally by me on the following activities:  Discussions with consultants, evaluation of patient's response to treatment, examination of patient, ordering and performing treatments and interventions, ordering and review of laboratory studies, ordering and review of radiographic studies, pulse oximetry, re-evaluation of patient's condition, obtaining history from patient or surrogate and review of old charts  CPR  Date/Time: 07-11-2019 1:25 AM Performed by: Merryl Hacker, MD Authorized by: Merryl Hacker, MD  CPR Procedure Details:    CPR/ACLS performed in the ED: Yes     Duration of CPR (minutes):  18   Outcome: Pt declared dead    CPR performed via ACLS guidelines under my direct supervision.  See RN documentation for details including defibrillator use, medications, doses and timing.   (including critical care time) Angiocath insertion Performed by: Merryl Hacker  Consent: Verbal consent obtained. Risks and benefits: risks, benefits and alternatives were discussed Time out: Immediately prior to procedure a "time out" was called to verify the correct patient, procedure, equipment, support staff and site/side marked as required.  Preparation: Patient was prepped and draped in the usual sterile fashion.  Vein Location: right EJ  Ultrasound Guided  Gauge: 18  Normal blood return and flush without difficulty Patient tolerance: Patient tolerated the procedure well with no immediate complications.  Angiocath insertion Performed by: Merryl Hacker  Consent: Verbal consent obtained. Risks and benefits: risks, benefits and alternatives were discussed Time out: Immediately prior to procedure a "time out" was called to verify the correct patient, procedure, equipment, support staff and  site/side marked as required.  Preparation: Patient was prepped and draped in the usual sterile fashion.  Vein Location: right basilic  Ultrasound  Guided  Gauge: 20  Normal blood return and flush without difficulty Patient tolerance: Patient tolerated the procedure well with no immediate complications.     Medications Ordered in ED Medications  ceFEPIme (MAXIPIME) 2 g in sodium chloride 0.9 % 100 mL IVPB (has no administration in time range)  vancomycin (VANCOREADY) IVPB 1500 mg/300 mL (has no administration in time range)  propofol (DIPRIVAN) 1000 MG/100ML infusion (has no administration in time range)  midazolam (VERSED) injection 1 mg (1 mg Intravenous Given 2019/06/29 0011)  midazolam (VERSED) injection 1 mg (has no administration in time range)  fentaNYL (SUBLIMAZE) injection 50 mcg (50 mcg Intravenous Given 2019-06-29 0011)  fentaNYL (SUBLIMAZE) injection 50 mcg (has no administration in time range)  dextrose 50 % solution (has no administration in time range)  etomidate (AMIDATE) injection (20 mg Intravenous Given 06/23/2019 2332)  succinylcholine (ANECTINE) injection (150 mg Intravenous Given 06/25/2019 1132)  succinylcholine (ANECTINE) injection (150 mg Intravenous Given 07/01/2019 2332)  sodium chloride 0.9 % bolus 2,500 mL (2,500 mLs Intravenous New Bag/Given 2019/06/29 0018)    ED Course  I have reviewed the triage vital signs and the nursing notes.  Pertinent labs & imaging results that were available during my care of the patient were reviewed by me and considered in my medical decision making (see chart for details).    MDM Rules/Calculators/A&P                       Patient presents from [facility] with respiratory distress and hypoxia.  He is very critically ill-appearing on exam.  At baseline hemiplegic with dysphagia and a G-tube.  He does not contribute to history taking.  I confirmed his CODE STATUS with his brother.  Sepsis work-up was initiated including broad-spectrum  antibiotics.  His initial blood pressure was hypertensive.  However, he subsequently became hypotensive.  For this reason he was ordered 30 cc Per kilo of fluid or 2500 cc.  He was intubated for airway control.  See resident note for full details.  Unfortunately, patient proved to be difficult IV access.  Multiple IVs placed by myself and nursing infiltrated.  IV team was consulted.  1245 I was at bedside placing IV.  1255 Back at bedside.  Patient notably now bradycardic.  I have ordered her EKG.  Concern for impending cardiac arrest.  Requested additional nursing resources.  0100 Patient went into asystole.  ACLS was initiated per protocol.  He was given epinephrine, calcium chloride and bicarbonate.  He had multiple rounds of ACLS with persistent asystole.  0117 Given patient's generally poor prognosis and prior comorbidities with absent pulses for greater than 15 minutes, ACLS was discontinued.  Time of death 0117.  0125 Patient's next of kin, Tarius Stangelo was notified of the patient's death.  All his questions were answered.  Of note, at time of coding, patient's labs were beginning to come back.  Lactate noted to be 9.9.  I suspect patient was in septic shock.  He also was clinically very volume depleted.  Final Clinical Impression(s) / ED Diagnoses Final diagnoses:  Acute respiratory failure with hypoxia ([place])  COVID-19  Cardiac arrest ([place])  Septic shock [facility])    Rx / DC Orders ED Discharge Orders    None       Dmario Russom, Barbette Hair, MD 2019/06/29 0131

## 2019-06-16 NOTE — ED Provider Notes (Signed)
  Procedure Name: Intubation Date/Time: 06/15/2019 11:55 PM Performed by: Ceriah Kohler, [place], MD Pre-anesthesia Checklist: Patient identified, Emergency Drugs available, Suction available, Patient being monitored and Timeout performed Oxygen Delivery Method: Non-rebreather mask Preoxygenation: Pre-oxygenation with 100% oxygen Induction Type: Rapid sequence Laryngoscope Size: Glidescope and 4 Grade View: Grade I Tube size: 7.5 mm Number of attempts: 1 Airway Equipment and Method: Rigid stylet and Video-laryngoscopy Placement Confirmation: ETT inserted through vocal cords under direct vision,  CO2 detector and Breath sounds checked- equal and bilateral Secured at: 25 cm Tube secured with: ETT holder             Curties Conigliaro, [place], MD 06/19/2019 2359    Merryl Hacker, MD 2019/06/23 [phone]

## 2019-06-17 LAB — CBG MONITORING, ED: Glucose-Capillary: 159 mg/dL — ABNORMAL HIGH (ref 70–99)

## 2019-06-17 LAB — RESPIRATORY PANEL BY RT PCR (FLU A&B, COVID)
Influenza A by PCR: NEGATIVE
Influenza B by PCR: NEGATIVE
SARS Coronavirus 2 by RT PCR: POSITIVE — AB

## 2019-06-17 LAB — POC SARS CORONAVIRUS 2 AG -  ED: SARS Coronavirus 2 Ag: NEGATIVE

## 2019-06-17 LAB — LACTIC ACID, PLASMA: Lactic Acid, Venous: 9.9 mmol/L (ref 0.5–1.9)

## 2019-06-17 MED ORDER — FENTANYL CITRATE (PF) 100 MCG/2ML IJ SOLN: 50.0000 ug | INTRAMUSCULAR | Status: DC | PRN

## 2019-06-17 MED ORDER — MIDAZOLAM HCL 2 MG/2ML IJ SOLN
1.0000 mg | INTRAMUSCULAR | Status: DC | PRN
  Administered 2019-06-17: 1 mg via INTRAVENOUS
  Filled 2019-06-17: qty 2

## 2019-06-17 MED ORDER — MIDAZOLAM HCL 2 MG/2ML IJ SOLN: 1.0000 mg | INTRAMUSCULAR | Status: DC | PRN

## 2019-06-17 MED ORDER — FENTANYL CITRATE (PF) 100 MCG/2ML IJ SOLN
50.0000 ug | INTRAMUSCULAR | Status: DC | PRN
  Administered 2019-06-17: 50 ug via INTRAVENOUS
  Filled 2019-06-17: qty 2

## 2019-06-17 MED ORDER — DEXTROSE 50 % IV SOLN
INTRAVENOUS | Status: AC
  Filled 2019-06-17: qty 50

## 2019-06-22 LAB — CULTURE, BLOOD (ROUTINE X 2): Culture: NO GROWTH

## 2019-07-07 NOTE — Code Documentation (Addendum)
Code blue was initiated at 0100, CPR was started immediately, 1st dose of epinephrine was given at 0101, 1st dose of sodium bicarbonate was given, 0104 calcium carbonate was give, 0105 D50 was given, 0106 second dose of epinephrine was given, 0110 third dose of epinephrine was given, 0113 fourth dose of epinephrine was given, 0116 fifth dose of epinephrine was given, 0117 was official time of death.

## 2019-07-07 NOTE — Progress Notes (Signed)
Ventilator discontinued per MD post cardiac arrest, ABG attempted to be drawn but was not successfully before PT arrested.

## 2019-07-07 DEATH — deceased
